# Patient Record
Sex: Female | Born: 1960 | Race: Black or African American | Hispanic: No | Marital: Married | Smoking: Current every day smoker
Health system: Southern US, Community
[De-identification: ages and names within clinical notes are randomized; demographics above are authoritative.]

## PROBLEM LIST (undated history)

## (undated) DIAGNOSIS — G8929 Other chronic pain: Secondary | ICD-10-CM

## (undated) DIAGNOSIS — J449 Chronic obstructive pulmonary disease, unspecified: Secondary | ICD-10-CM

## (undated) DIAGNOSIS — N63 Unspecified lump in unspecified breast: Secondary | ICD-10-CM

## (undated) DIAGNOSIS — I10 Essential (primary) hypertension: Secondary | ICD-10-CM

## (undated) DIAGNOSIS — Z970 Presence of artificial eye: Secondary | ICD-10-CM

## (undated) DIAGNOSIS — T4145XA Adverse effect of unspecified anesthetic, initial encounter: Secondary | ICD-10-CM

## (undated) DIAGNOSIS — K219 Gastro-esophageal reflux disease without esophagitis: Secondary | ICD-10-CM

## (undated) DIAGNOSIS — R0789 Other chest pain: Secondary | ICD-10-CM

## (undated) DIAGNOSIS — J45909 Unspecified asthma, uncomplicated: Secondary | ICD-10-CM

## (undated) DIAGNOSIS — R51 Headache: Secondary | ICD-10-CM

## (undated) DIAGNOSIS — F17210 Nicotine dependence, cigarettes, uncomplicated: Secondary | ICD-10-CM

## (undated) DIAGNOSIS — R519 Headache, unspecified: Secondary | ICD-10-CM

## (undated) DIAGNOSIS — Z9289 Personal history of other medical treatment: Secondary | ICD-10-CM

## (undated) DIAGNOSIS — K5790 Diverticulosis of intestine, part unspecified, without perforation or abscess without bleeding: Secondary | ICD-10-CM

## (undated) DIAGNOSIS — M1990 Unspecified osteoarthritis, unspecified site: Secondary | ICD-10-CM

## (undated) DIAGNOSIS — E669 Obesity, unspecified: Secondary | ICD-10-CM

## (undated) DIAGNOSIS — I5030 Unspecified diastolic (congestive) heart failure: Secondary | ICD-10-CM

## (undated) DIAGNOSIS — E119 Type 2 diabetes mellitus without complications: Secondary | ICD-10-CM

## (undated) DIAGNOSIS — K259 Gastric ulcer, unspecified as acute or chronic, without hemorrhage or perforation: Secondary | ICD-10-CM

## (undated) DIAGNOSIS — K589 Irritable bowel syndrome without diarrhea: Secondary | ICD-10-CM

## (undated) DIAGNOSIS — D1803 Hemangioma of intra-abdominal structures: Secondary | ICD-10-CM

## (undated) DIAGNOSIS — G4730 Sleep apnea, unspecified: Secondary | ICD-10-CM

## (undated) HISTORY — DX: Gastro-esophageal reflux disease without esophagitis: K21.9

## (undated) HISTORY — DX: Other chronic pain: G89.29

## (undated) HISTORY — DX: Chronic obstructive pulmonary disease, unspecified: J44.9

## (undated) HISTORY — PX: LAPAROSCOPY: SHX197

## (undated) HISTORY — DX: Personal history of other medical treatment: Z92.89

## (undated) HISTORY — DX: Headache, unspecified: R51.9

## (undated) HISTORY — DX: Headache: R51

## (undated) HISTORY — DX: Unspecified osteoarthritis, unspecified site: M19.90

## (undated) HISTORY — DX: Essential (primary) hypertension: I10

## (undated) HISTORY — DX: Sleep apnea, unspecified: G47.30

## (undated) HISTORY — DX: Hemangioma of intra-abdominal structures: D18.03

## (undated) HISTORY — PX: RECONSTRUCTION OF EYELID: SHX6576

## (undated) HISTORY — DX: Nicotine dependence, cigarettes, uncomplicated: F17.210

## (undated) HISTORY — DX: Type 2 diabetes mellitus without complications: E11.9

## (undated) HISTORY — PX: TUBAL LIGATION: SHX77

## (undated) HISTORY — DX: Obesity, unspecified: E66.9

## (undated) HISTORY — DX: Other chest pain: R07.89

## (undated) HISTORY — DX: Irritable bowel syndrome, unspecified: K58.9

## (undated) HISTORY — DX: Diverticulosis of intestine, part unspecified, without perforation or abscess without bleeding: K57.90

## (undated) HISTORY — DX: Gastric ulcer, unspecified as acute or chronic, without hemorrhage or perforation: K25.9

## (undated) HISTORY — DX: Unspecified asthma, uncomplicated: J45.909

---
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
## 1991-12-01 HISTORY — PX: CHOLECYSTECTOMY: SHX55

## 1998-11-18 ENCOUNTER — Encounter: Payer: Self-pay | Admitting: Gastroenterology

## 1998-11-18 ENCOUNTER — Other Ambulatory Visit: Admission: RE | Admit: 1998-11-18 | Discharge: 1998-11-18 | Payer: Self-pay | Admitting: Gastroenterology

## 1999-06-14 ENCOUNTER — Inpatient Hospital Stay (HOSPITAL_COMMUNITY): Admission: AD | Admit: 1999-06-14 | Discharge: 1999-06-14 | Payer: Self-pay | Admitting: Gynecology

## 1999-08-14 ENCOUNTER — Encounter: Payer: Self-pay | Admitting: Obstetrics and Gynecology

## 1999-08-14 ENCOUNTER — Ambulatory Visit (HOSPITAL_COMMUNITY): Admission: RE | Admit: 1999-08-14 | Discharge: 1999-08-14 | Payer: Self-pay | Admitting: Obstetrics and Gynecology

## 1999-09-15 ENCOUNTER — Inpatient Hospital Stay (HOSPITAL_COMMUNITY): Admission: AD | Admit: 1999-09-15 | Discharge: 1999-09-15 | Payer: Self-pay | Admitting: Obstetrics and Gynecology

## 1999-09-20 ENCOUNTER — Encounter: Payer: Self-pay | Admitting: Obstetrics and Gynecology

## 1999-09-20 ENCOUNTER — Ambulatory Visit (HOSPITAL_COMMUNITY): Admission: RE | Admit: 1999-09-20 | Discharge: 1999-09-20 | Payer: Self-pay | Admitting: Obstetrics and Gynecology

## 1999-09-28 ENCOUNTER — Encounter: Payer: Self-pay | Admitting: Obstetrics and Gynecology

## 1999-09-28 ENCOUNTER — Ambulatory Visit (HOSPITAL_COMMUNITY): Admission: RE | Admit: 1999-09-28 | Discharge: 1999-09-28 | Payer: Self-pay | Admitting: Obstetrics and Gynecology

## 1999-11-20 ENCOUNTER — Encounter: Payer: Self-pay | Admitting: *Deleted

## 1999-11-20 ENCOUNTER — Encounter: Admission: RE | Admit: 1999-11-20 | Discharge: 1999-11-20 | Payer: Self-pay | Admitting: *Deleted

## 2000-07-08 ENCOUNTER — Encounter: Payer: Self-pay | Admitting: Family Medicine

## 2000-07-08 ENCOUNTER — Ambulatory Visit (HOSPITAL_COMMUNITY): Admission: RE | Admit: 2000-07-08 | Discharge: 2000-07-08 | Payer: Self-pay | Admitting: Family Medicine

## 2000-08-19 ENCOUNTER — Ambulatory Visit (HOSPITAL_BASED_OUTPATIENT_CLINIC_OR_DEPARTMENT_OTHER): Admission: RE | Admit: 2000-08-19 | Discharge: 2000-08-19 | Payer: Self-pay | Admitting: Otolaryngology

## 2000-09-05 ENCOUNTER — Inpatient Hospital Stay (HOSPITAL_COMMUNITY): Admission: AD | Admit: 2000-09-05 | Discharge: 2000-09-05 | Payer: Self-pay | Admitting: Gynecology

## 2000-09-05 ENCOUNTER — Encounter: Payer: Self-pay | Admitting: Gynecology

## 2000-09-07 ENCOUNTER — Other Ambulatory Visit: Admission: RE | Admit: 2000-09-07 | Discharge: 2000-09-07 | Payer: Self-pay | Admitting: Gynecology

## 2000-09-07 ENCOUNTER — Encounter (INDEPENDENT_AMBULATORY_CARE_PROVIDER_SITE_OTHER): Payer: Self-pay | Admitting: Specialist

## 2000-09-10 ENCOUNTER — Inpatient Hospital Stay (HOSPITAL_COMMUNITY): Admission: AD | Admit: 2000-09-10 | Discharge: 2000-09-10 | Payer: Self-pay | Admitting: *Deleted

## 2000-09-18 ENCOUNTER — Encounter (INDEPENDENT_AMBULATORY_CARE_PROVIDER_SITE_OTHER): Payer: Self-pay

## 2000-09-18 ENCOUNTER — Encounter: Payer: Self-pay | Admitting: Gynecology

## 2000-09-19 ENCOUNTER — Inpatient Hospital Stay (HOSPITAL_COMMUNITY): Admission: AD | Admit: 2000-09-19 | Discharge: 2000-09-23 | Payer: Self-pay | Admitting: Gynecology

## 2000-09-20 ENCOUNTER — Encounter: Payer: Self-pay | Admitting: Gynecology

## 2000-09-21 ENCOUNTER — Encounter: Payer: Self-pay | Admitting: Gynecology

## 2002-01-27 ENCOUNTER — Other Ambulatory Visit: Admission: RE | Admit: 2002-01-27 | Discharge: 2002-01-27 | Payer: Self-pay | Admitting: Gynecology

## 2002-03-24 ENCOUNTER — Encounter: Payer: Self-pay | Admitting: *Deleted

## 2002-03-24 ENCOUNTER — Ambulatory Visit (HOSPITAL_COMMUNITY): Admission: RE | Admit: 2002-03-24 | Discharge: 2002-03-24 | Payer: Self-pay | Admitting: *Deleted

## 2002-05-19 ENCOUNTER — Encounter: Admission: RE | Admit: 2002-05-19 | Discharge: 2002-05-19 | Payer: Self-pay | Admitting: Gynecology

## 2002-07-27 ENCOUNTER — Encounter: Admission: RE | Admit: 2002-07-27 | Discharge: 2002-07-27 | Payer: Self-pay | Admitting: General Surgery

## 2002-07-27 ENCOUNTER — Encounter: Payer: Self-pay | Admitting: General Surgery

## 2002-08-04 ENCOUNTER — Inpatient Hospital Stay (HOSPITAL_COMMUNITY): Admission: AD | Admit: 2002-08-04 | Discharge: 2002-08-07 | Payer: Self-pay | Admitting: Gynecology

## 2002-08-04 ENCOUNTER — Encounter (INDEPENDENT_AMBULATORY_CARE_PROVIDER_SITE_OTHER): Payer: Self-pay

## 2002-08-04 DIAGNOSIS — O24419 Gestational diabetes mellitus in pregnancy, unspecified control: Secondary | ICD-10-CM

## 2002-09-11 ENCOUNTER — Other Ambulatory Visit: Admission: RE | Admit: 2002-09-11 | Discharge: 2002-09-11 | Payer: Self-pay | Admitting: Gynecology

## 2002-11-30 HISTORY — PX: UMBILICAL HERNIA REPAIR: SHX196

## 2002-11-30 HISTORY — PX: HERNIA REPAIR: SHX51

## 2003-01-05 ENCOUNTER — Encounter: Payer: Self-pay | Admitting: General Surgery

## 2003-01-08 ENCOUNTER — Inpatient Hospital Stay (HOSPITAL_COMMUNITY): Admission: EM | Admit: 2003-01-08 | Discharge: 2003-01-10 | Payer: Self-pay | Admitting: General Surgery

## 2003-07-12 ENCOUNTER — Encounter: Payer: Self-pay | Admitting: *Deleted

## 2003-07-12 ENCOUNTER — Ambulatory Visit (HOSPITAL_COMMUNITY): Admission: RE | Admit: 2003-07-12 | Discharge: 2003-07-12 | Payer: Self-pay | Admitting: *Deleted

## 2003-09-24 ENCOUNTER — Other Ambulatory Visit: Admission: RE | Admit: 2003-09-24 | Discharge: 2003-09-24 | Payer: Self-pay | Admitting: Gynecology

## 2004-12-18 ENCOUNTER — Other Ambulatory Visit: Admission: RE | Admit: 2004-12-18 | Discharge: 2004-12-18 | Payer: Self-pay | Admitting: Gynecology

## 2005-10-26 ENCOUNTER — Emergency Department (HOSPITAL_COMMUNITY): Admission: EM | Admit: 2005-10-26 | Discharge: 2005-10-26 | Payer: Self-pay | Admitting: Emergency Medicine

## 2005-12-25 ENCOUNTER — Emergency Department (HOSPITAL_COMMUNITY): Admission: EM | Admit: 2005-12-25 | Discharge: 2005-12-25 | Payer: Self-pay | Admitting: Emergency Medicine

## 2006-06-17 ENCOUNTER — Emergency Department (HOSPITAL_COMMUNITY): Admission: EM | Admit: 2006-06-17 | Discharge: 2006-06-17 | Payer: Self-pay | Admitting: Emergency Medicine

## 2007-03-25 HISTORY — PX: CARDIOVASCULAR STRESS TEST: SHX262

## 2007-08-10 ENCOUNTER — Emergency Department (HOSPITAL_COMMUNITY): Admission: RE | Admit: 2007-08-10 | Discharge: 2007-08-11 | Payer: Self-pay | Admitting: Family Medicine

## 2007-12-27 ENCOUNTER — Observation Stay (HOSPITAL_COMMUNITY): Admission: EM | Admit: 2007-12-27 | Discharge: 2007-12-28 | Payer: Self-pay | Admitting: Emergency Medicine

## 2007-12-28 HISTORY — PX: CARDIAC CATHETERIZATION: SHX172

## 2008-05-03 ENCOUNTER — Ambulatory Visit (HOSPITAL_COMMUNITY): Admission: RE | Admit: 2008-05-03 | Discharge: 2008-05-03 | Payer: Self-pay | Admitting: Obstetrics and Gynecology

## 2008-05-03 ENCOUNTER — Ambulatory Visit: Payer: Self-pay | Admitting: Vascular Surgery

## 2008-05-03 ENCOUNTER — Encounter (INDEPENDENT_AMBULATORY_CARE_PROVIDER_SITE_OTHER): Payer: Self-pay | Admitting: Obstetrics and Gynecology

## 2008-05-07 ENCOUNTER — Inpatient Hospital Stay (HOSPITAL_COMMUNITY): Admission: AD | Admit: 2008-05-07 | Discharge: 2008-05-07 | Payer: Self-pay | Admitting: Obstetrics and Gynecology

## 2008-05-08 HISTORY — PX: US ECHOCARDIOGRAPHY: HXRAD669

## 2008-05-25 ENCOUNTER — Encounter (HOSPITAL_COMMUNITY): Payer: Self-pay | Admitting: Obstetrics and Gynecology

## 2008-05-25 ENCOUNTER — Ambulatory Visit: Payer: Self-pay | Admitting: Surgery

## 2008-05-25 ENCOUNTER — Inpatient Hospital Stay (HOSPITAL_COMMUNITY): Admission: AD | Admit: 2008-05-25 | Discharge: 2008-05-25 | Payer: Self-pay | Admitting: Obstetrics and Gynecology

## 2008-06-21 ENCOUNTER — Inpatient Hospital Stay (HOSPITAL_COMMUNITY): Admission: AD | Admit: 2008-06-21 | Discharge: 2008-06-22 | Payer: Self-pay | Admitting: Obstetrics & Gynecology

## 2008-07-13 ENCOUNTER — Inpatient Hospital Stay (HOSPITAL_COMMUNITY): Admission: AD | Admit: 2008-07-13 | Discharge: 2008-07-13 | Payer: Self-pay | Admitting: Obstetrics and Gynecology

## 2008-08-03 ENCOUNTER — Inpatient Hospital Stay (HOSPITAL_COMMUNITY): Admission: AD | Admit: 2008-08-03 | Discharge: 2008-08-03 | Payer: Self-pay | Admitting: Obstetrics and Gynecology

## 2008-08-10 ENCOUNTER — Inpatient Hospital Stay (HOSPITAL_COMMUNITY): Admission: AD | Admit: 2008-08-10 | Discharge: 2008-08-10 | Payer: Self-pay | Admitting: Obstetrics and Gynecology

## 2008-08-15 ENCOUNTER — Inpatient Hospital Stay (HOSPITAL_COMMUNITY): Admission: AD | Admit: 2008-08-15 | Discharge: 2008-08-15 | Payer: Self-pay | Admitting: Obstetrics and Gynecology

## 2008-08-20 ENCOUNTER — Inpatient Hospital Stay (HOSPITAL_COMMUNITY): Admission: AD | Admit: 2008-08-20 | Discharge: 2008-08-20 | Payer: Self-pay | Admitting: Obstetrics and Gynecology

## 2008-09-06 ENCOUNTER — Inpatient Hospital Stay (HOSPITAL_COMMUNITY): Admission: AD | Admit: 2008-09-06 | Discharge: 2008-09-06 | Payer: Self-pay | Admitting: Obstetrics and Gynecology

## 2008-09-14 ENCOUNTER — Encounter (INDEPENDENT_AMBULATORY_CARE_PROVIDER_SITE_OTHER): Payer: Self-pay | Admitting: Obstetrics and Gynecology

## 2008-09-14 ENCOUNTER — Inpatient Hospital Stay (HOSPITAL_COMMUNITY)
Admission: RE | Admit: 2008-09-14 | Discharge: 2008-09-17 | Payer: Self-pay | Source: Ambulatory Visit | Admitting: Obstetrics and Gynecology

## 2008-09-24 ENCOUNTER — Inpatient Hospital Stay (HOSPITAL_COMMUNITY): Admission: AD | Admit: 2008-09-24 | Discharge: 2008-09-27 | Payer: Self-pay | Admitting: Obstetrics and Gynecology

## 2008-09-25 ENCOUNTER — Other Ambulatory Visit: Payer: Self-pay | Admitting: Internal Medicine

## 2008-09-25 ENCOUNTER — Encounter (INDEPENDENT_AMBULATORY_CARE_PROVIDER_SITE_OTHER): Payer: Self-pay | Admitting: Obstetrics and Gynecology

## 2008-09-26 ENCOUNTER — Other Ambulatory Visit: Payer: Self-pay | Admitting: Cardiovascular Disease

## 2008-09-26 ENCOUNTER — Encounter: Payer: Self-pay | Admitting: Cardiovascular Disease

## 2008-09-26 ENCOUNTER — Ambulatory Visit: Payer: Self-pay | Admitting: *Deleted

## 2008-10-31 ENCOUNTER — Encounter: Admission: RE | Admit: 2008-10-31 | Discharge: 2008-10-31 | Payer: Self-pay | Admitting: Obstetrics and Gynecology

## 2009-12-19 ENCOUNTER — Encounter: Admission: RE | Admit: 2009-12-19 | Discharge: 2010-03-19 | Payer: Self-pay | Admitting: Obstetrics and Gynecology

## 2010-09-30 HISTORY — PX: ENDOMETRIAL ABLATION: SHX621

## 2010-10-14 ENCOUNTER — Ambulatory Visit (HOSPITAL_COMMUNITY)
Admission: RE | Admit: 2010-10-14 | Discharge: 2010-10-14 | Payer: Self-pay | Source: Home / Self Care | Admitting: Obstetrics and Gynecology

## 2010-12-21 ENCOUNTER — Encounter: Payer: Self-pay | Admitting: Family Medicine

## 2011-02-10 LAB — BASIC METABOLIC PANEL
Creatinine, Ser: 0.79 mg/dL (ref 0.4–1.2)
GFR calc Af Amer: >60 mL/min (ref >60)
Potassium: 4 mEq/L (ref 3.5–5.1)
Sodium: 139 mEq/L (ref 135–145)

## 2011-02-10 LAB — CBC
HCT: 44.3 % (ref 36.0–46.0)
Hemoglobin: 14.8 g/dL (ref 12.0–15.0)
Platelets: 244 10*3/uL (ref 150–400)

## 2011-04-14 NOTE — Consult Note (Signed)
NAMEDORNA, Lisa [name]               ACCOUNT NO.:  [id number]   MEDICAL RECORD NO.:  [id number]          PATIENT TYPE:  WOC   LOCATION:  WOC                          FACILITY:  [facility]   PHYSICIAN:  Antonietta Breach, M.D.  DATE OF BIRTH:  [birth year]   DATE OF CONSULTATION:  09/26/2008  DATE OF DISCHARGE:                                 CONSULTATION   REQUESTING PHYSICIAN:  Vesta Mixer, M.D.   REASON FOR CONSULTATION:  Anxiety, rule-out postpartum depression.   HISTORY OF PRESENT ILLNESS:  Lisa [name] is a 50 year old female  admitted to the [facility] with chest pain   Her chest pain evaluation has been negative for cardiac source.   However, the patient does continue with excessive worry.  She is worried  about various possibilities that are unlikely such as her new baby  having SIDS or her losing another relative who was close to her.  She  weeps when these worries increase or when she is given the opportunity  to talk about them.  She does have intact interests and intact hope.  Her appetite is normal.  She is not having any thoughts of harming  herself or others.  She has no delusions or hallucinations.   Her orientation and memory function are intact.  She is cooperative and  socially appropriate with her healthcare providers however, she has been  insisting that her self assessment is that she is clear for discharge  and therefore she wants to go home and be with her new baby and has been  saying that she will leave against medical advice   She stopped making comments about leaving against medical advice and is  now willing to see the undersigned regarding mental health care.  The  patients excessive worry has not worsened however, it has not improved  with reassurance by friends.   The onset of the increased worry is associated with the birth of the  baby.  Please see the discussion below.   PAST PSYCHIATRIC HISTORY:  Six years ago the patient delivered a  baby  and her grandmother died at the same time.  She developed depressed mood  and suicidal thoughts and was prescribed Zoloft by her primary care  physician.  As the Zoloft was increased she developed severe  restlessness and akathisia and stopped it.  She did recover from the  depressive symptoms.  It appears now that the birth of her new baby is a  cue for recurrent grief and worry that someone else close to her in her  family might die soon.   The patient does describe a number deaths in the family that occurred  when the patient was in her adolescence.  She recognizes that she never  had a chance to adequately express her grief.   The patient does not have any history of increased energy or decreased  need for sleep or other manic symptoms.  She has no history of suicide  attempts.  She has no history of hallucinations.  She has never had any  other mental health care than that mentioned above.  In review of the past medical record there is no mentioning of  counseling or psychotropic medication.   FAMILY PSYCHIATRIC HISTORY:  None known.   SOCIAL HISTORY:  The patient has a 36-year-old child at home, along with  her newborn baby which was just delivered at [facility] recently.  She has a supportive husband.  She does not use alcohol or illegal  drugs.   PAST MEDICAL HISTORY:  1. Recent C-section.  2. History of prior C-section.  3. History of hypertension.   No known drug allergies.   MEDICATIONS:  MAR is reviewed, not on any psychotropic medications.   LABORATORY DATA:  WBC 7.6, hemoglobin 13.2.  Sodium 140, potassium 3.6,  BUN 15, creatinine 0.78, glucose 83. RPR nonreactive.   REVIEW OF SYSTEMS:  CONSTITUTIONAL, HEAD, EYES, EARS, NOSE, THROAT,  MOUTH, NEUROLOGIC, PSYCHIATRIC, CARDIOVASCULAR, RESPIRATORY,  GASTROINTESTINAL, GENITOURINARY, SKIN, MUSCULOSKELETAL, HEMATOLOGIC,  LYMPHATIC, ENDOCRINE, METABOLIC:  All unremarkable.   PHYSICAL EXAMINATION:  VITAL  SIGNS:  Temperature 98.4, pulse 61,  respiratory rate 16, blood pressure 149/72, O2 saturation on 2 L 98%.  GENERAL APPEARANCE:  Lisa [name] is a middle-aged female sitting up in  her hospital bed with no abnormal involuntary movements.   MENTAL STATUS EXAM:  Lisa [name] is alert.  Her eye contact is good.  Her attention span is within normal limits.  Her concentration is within  normal limits.  Her affect is broad and appropriate except for  occasional tears where she is discussing the subjects and worries  mentioned above.  Her mood is slightly anxious.  She is oriented to all  spheres.  Her memory is intact to immediate, recent and remote.  Her  fund of knowledge and intelligence are within normal limits.  Speech  involves normal rate and prosody without dysarthria.  Thought process is  logical, coherent, goal-directed.  No looseness of associations.  Thought content, no thoughts of harming herself, no thoughts of harming  others, no delusions, no hallucinations.  Insight is intact, judgment is  intact.   ASSESSMENT:  AXIS I:  293.84.  1.  Anxiety disorder not otherwise  specified.  Rule-out adjustment with anxious mood versus generalized  anxiety disorder.  2.  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  AXIS II:  Deferred.  AXIS III:  See past medical history.  AXIS IV:  1.  General medical.  2.  Grief.  AXIS V:  55.   Lisa [name] is not at risk to harm herself or others or the baby.  She  does agree to call emergency services 911 immediately for any thoughts  of harming herself, thoughts of harming others or thoughts of harming  the baby, worsening symptoms or distress.   The undersigned provided ego supportive psychotherapy and education   The undersigned discussed medication.  The patient declines medication  and she has no critical indications for the medication.   The undersigned discussed psychotherapy for grief, as well as cognitive  behavioral  therapy, deep breathing and progressive muscle relaxation for  anxiety.   The intensive outpatient program was also discussed which would allow  her to attend intensive coping skills and stress management therapy  starting immediately however, the patient declined this form of  treatment and she is not committable.   Lisa [name] prefers  to have a standard psychotherapy appointment where  she can pursue the cognitive behavioral therapy, deep breathing and  progressive muscle relaxation along with grief counseling.   She agrees to an  outpatient psychiatric appointment also for further  examination to confirm that she is proceeding without postpartum  depression.   The undersigned will place an order for the social worker to coordinate  with the patient and her insurance company [employer] panel to set up both  of the above follow-ups.      Antonietta Breach, M.D.  Electronically Signed     JW/MEDQ  D:  09/26/2008  T:  09/26/2008  Job:  [id number]   cc:   Vesta Mixer, M.D.

## 2011-04-14 NOTE — Consult Note (Signed)
[name], CONWAY               ACCOUNT NO.:  [id number]   MEDICAL RECORD NO.:  [id number]          PATIENT TYPE:  INP   LOCATION:  2915                         FACILITY:  [facility]   PHYSICIAN:  Antonietta Breach, M.D.  DATE OF BIRTH:  [birth year]   DATE OF CONSULTATION:  09/26/2008  DATE OF DISCHARGE:                                 CONSULTATION   Would confirm that a recent TSH has been conducted, and if not, would  check the patient's TSH level.      Antonietta Breach, M.D.  Electronically Signed     JW/MEDQ  D:  09/26/2008  T:  09/26/2008  Job:  [id number]

## 2011-04-14 NOTE — Op Note (Signed)
NAMETERRICKA, Lisa               ACCOUNT NO.:  [id number]   MEDICAL RECORD NO.:  [id number]          PATIENT TYPE:  INP   LOCATION:  9137                          [facility]   PHYSICIAN:  Michelle L. Grewal, M.D.DATE OF BIRTH:  Dec 10, 1960   DATE OF PROCEDURE:  09/14/2008  DATE OF DISCHARGE:                               OPERATIVE REPORT   PREOPERATIVE DIAGNOSES:  Intrauterine pregnancy at 36 weeks and 5 days,  previous cesarean section, desires permanent sterilization, mature  amniocentesis, chronic hypertension, and progressive shortness of breath  and edema.   POSTOPERATIVE DIAGNOSES:  Intrauterine pregnancy at 36 weeks and 5 days,  previous cesarean section, desires permanent sterilization, mature  amniocentesis, chronic hypertension, and progressive shortness of breath  and edema.   PROCEDURE:  Repeat low-transverse cesarean section and bilateral tubal  ligation.   HISTORY OF PRESENT ILLNESS:  The patient is a 50 year old gravida 3,  para 1 who presents today at 36 weeks and 5 days.  Her medical history  is significant for chronic hypertension followed by Dr. Elease Hashimoto and Dr.  Patty Sermons.  She has been on atenolol throughout her pregnancy.  She has  been unable to tolerate labetalol or any other medication such as  metoprolol or Aldomet.  She has also a history of seasonal asthma.  The  patient has progressively developed progressive shortness of breath,  dyspnea, and edema throughout her pregnancy which has worsened over the  past few weeks.  She also has a history of sleep apnea for which she  sees Dr. Elease Hashimoto for.  Her blood pressures have been under fair control  with her antihypertensive.  She had a amniocentesis because of her  progression of symptoms which showed an L/S of 3.7 with PEG.  The  patient is scheduled today for repeat low transverse cesarean section  and she also desires permanent sterilization.   DESCRIPTION OF PROCEDURE:  The patient is taken to the  operating room  and she is given spinal without incidence.  She was prepped and draped  in usual sterile fashion.  A Foley catheter was inserted.  A low  transverse incision was made and carried down to the fascia.  Fascia was  scored in the midline and extended laterally.  Of note, she had a  previous vertical incision.  There was a  permanent suture noted and as  well as mesh noted in the upper part of her abdomen, but was not near  were we made the Pfannenstiel incision.  The peritoneum was entered  bluntly.  The peritoneal incision was then stretched.  The bladder flap  was created sharply and then digitally and the bladder blade was  readjusted.  A low transverse incision was made in the uterus and the  uterus was entered using a hemostat.  The baby was in cephalic  presentation and was delivered easily with a vacuum extractor.  The baby  was a female infant with Apgars of 8 at one minute and 9 at five minutes  and the cord was clamped and cut and he was taken to the newborn  nursery.  The placenta was manually removed, noted to be normal and  intact with three-vessel cord.  The uterus was cleared of all clots and  debris.  I was unable to exteriorize the uterus because the uterus was  so enlarged because of her multiple fibroids.  The patient desired  permanent sterilization.  I did close the uterine incision with one  layer using 0 chromic in a running locked stitch.  Hemostasis was  excellent.  I then identified the left fallopian tube, followed it out  to the fimbriated end, grasped the midportion of the tube and grasped  the midportion of the tube and tied off at 3 cm a knuckle x2 with plain  gut suture x2 and then excised that knuckle using Metzenbaum scissors.  This was sent to Pathology as left fallopian tube segment.  I then  turned my attention to the right fallopian tube, followed it out to the  fimbriated end.  I could tell that she has had a previous ectopic  pregnancy  because it seemed like the midportion of that tube was  atrophied and it almost looked like she had a salpingectomy even though  the patient told me that she had not.  I then grasped the midportion of  that tube, tied off a 3 cm knuckle and tied it off using plain gut  suture x2, that knuckle tube was then excised and sent to Pathology and  labeled as right fallopian tube segment.  Hemostasis was excellent.  Irrigation was performed.  Hemostasis was again noted.  The peritoneum  rectus muscles were reapproximated using 0 Vicryl.  The fascia was  closed using 0 Vicryl x2 starting each corner meeting in the midline in  the running stitch.  After irrigation of the subcutaneous layer, the  skin was closed with staples.  All sponge, lap, and instrument counts  were correct x2.  The patient went to recovery room in stable condition.      Michelle L. Vincente Poli, M.D.  Electronically Signed     MLG/MEDQ  D:  09/14/2008  T:  09/15/2008  Job:  [id number]

## 2011-04-14 NOTE — Cardiovascular Report (Signed)
[name], [name]               ACCOUNT NO.:  [id number]   MEDICAL RECORD NO.:  [id number]          PATIENT TYPE:  INP   LOCATION:  6531                         FACILITY:  [facility]   PHYSICIAN:  Vesta Mixer, M.D. DATE OF BIRTH:  [birth year]   DATE OF PROCEDURE:  12/28/2007  DATE OF DISCHARGE:                            CARDIAC CATHETERIZATION   Lisa [name] is a 50 year old female with a history of chest pain for  the past year.  She was admitted yesterday with recurrent episode of  chest pain that felt like a bowling ball in her chest.  She is referred  for heart catheterization for further evaluation.   The right femoral artery was easily cannulated using a modified  Seldinger technique.   HEMODYNAMICS:  LV pressure was 139/12 with an aortic pressure of 138/79.   Angiography, left main:  The left main is fairly normal.   The left main divides into four different branches.  We will call these  the LAD, ramus intermediate #1, ramus intermediate #2, and a left  circumflex artery.   The left anterior descending artery has a few minor luminal  irregularities.  The first diagonal artery is fairly distal branch and  has only minor luminal irregularities.  The LAD reaches around the apex  and supplies the inferoapical wall.   The first ramus intermediate branch is a moderate-sized branch.  There  are minor luminal irregularities.   The second ramus intermediate branch follows a more traditional path of  a ramus intermediate and is relatively free of disease.   The circumflex artery is a moderate-sized vessel.  It gives off a small  obtuse marginal artery which is normal.  There are minor luminal  irregularities in the circumflex vessel.   The right coronary artery is moderate size.  It gives off a few small RV  marginal branches and then a small posterior descending artery.  There  are minor luminal irregularities in the right coronary artery.   Left ventriculogram was  performed in a 30 RAO position.  It reveals  normal/supranormal left ventricular systolic function.  The ejection  fraction is around 75%.   COMPLICATIONS:  None.   CONCLUSION:  1. Minor coronary artery irregularities.  2. Normal left ventricular systolic function.   The patient will be stable for discharge later this evening.           ______________________________  Vesta Mixer, M.D.     PJN/MEDQ  D:  12/28/2007  T:  12/28/2007  Job:  [id number]   cc:   Maryelizabeth Rowan, M.D.

## 2011-04-14 NOTE — Discharge Summary (Signed)
Lisa [name], Lisa [name]               ACCOUNT NO.:  [id number]   MEDICAL RECORD NO.:  [id number]          PATIENT TYPE:  INP   LOCATION:  2915                         FACILITY:  [facility]   PHYSICIAN:  Vesta Mixer, M.D. DATE OF BIRTH:  12-Jul-1961   DATE OF ADMISSION:  09/25/2008  DATE OF DISCHARGE:  09/27/2008                               DISCHARGE SUMMARY   DISCHARGE DIAGNOSES:  1. Noncardiac chest pain.  2. Recent cesarean section delivery of a healthy baby.  3. History of cigarette smoking.  4. Hypertension.   DISCHARGE MEDICATIONS:  1. Amlodipine 5 mg a day.  2. Hydrochlorothiazide 25 mg a day.  3. Ibuprofen 200 mg - 2 tablets 3 times a day as needed.  4. Atenolol 25 mg a day.  5. Potassium chloride 10 mEq a day.   DISPOSITION:  The patient will see Dr. Elease Hashimoto for followup in 1-2 weeks.   PROCEDURES DONE:  1. CT angio of the chest - negative for pulmonary embolus and negative      for other abnormalities.  2. Echocardiogram - normal left ventricular systolic function.  3. Venous Dopplers of the right lower extremity - no evidence of DVT.   HISTORY:  Lisa [name] is a 50 year old female who was admitted  originally by Dr. Zelphia Cairo to [facility] for episodes of  chest pain.  The chest pain and shortness breath occurred 1 week  following the C-section of her baby.  She was transferred to Dr.  Harvie Bridge service and was transferred to [facility]  Please see the  dictated H&P by Dr. Renaldo Fiddler and the consult note by Dr. Elease Hashimoto.   HOSPITAL COURSE:  1. Chest pain.  The patient ruled out for myocardial infarction.  The      day after admission, she had a CT angiography, which revealed no      evidence of pulmonary embolus.  She also had no significant other      pulmonary abnormalities by angiography.  She was noted to have some      shortness of breath and her pO2 at [facility] was in the 40s      despite having 100% O2 saturation.  When she transferred to  [facility], her pO2 was in the 80 range.  It is possible that      the lab may have some calibration issues with their ABG machine.      The patient did fairly well.  She had an echocardiogram, which      revealed normal pulmonary pressures.  She has normal left      ventricular systolic function.  The patient has had a heart      catheterization in January 2009, which revealed smooth and normal      coronary arteries.  We will continue to follow up with chest pain      as an outpatient.  2. Hypertension.  The patient's blood pressure was noted be elevated.      She has been off some of her normal medications during her  pregnancy.  We will restart her on hydrochlorothiazide 25 mg a day,      potassium chloride 10 mEq a day, and amlodipine 5 mg a day.  She      will continue on atenolol 25 mg once a day, which is a slightly      lower dose.  3. Anxiety.  The patient expressed some postpartum depression and      anxiety.  She was seen by Dr. Jeanie Sewer, who agreed that she was      probably suffering from anxiety.  He offered to see her as an      outpatient.  No medications were started.  4. Status post delivery.  She will follow up with the OB/GYN doctors      as needed.      Vesta Mixer, M.D.  Electronically Signed     PJN/MEDQ  D:  09/27/2008  T:  09/27/2008  Job:  [id number]   cc:   Zelphia Cairo, MD

## 2011-04-14 NOTE — Consult Note (Signed)
[name], [name]               ACCOUNT NO.:  [id number]   MEDICAL RECORD NO.:  [id number]          PATIENT TYPE:  INP   LOCATION:  9372                          [facility]   PHYSICIAN:  Vesta Mixer, M.D. DATE OF BIRTH:  1961/10/23   DATE OF CONSULTATION:  09/25/2008  DATE OF DISCHARGE:  09/25/2008                                 CONSULTATION   Lisa [name] is a 50 year old female with a history of hypertension and  chest pain.  She recently had a C-section, which was relatively  uneventful.  She presents now with increasing shortness of breath and  chest pain.   Lisa [name] has a long history of chest pains in the past.  She has been  followed by me for several years.  We performed a heart catheterization  in January 2008, which revealed normal left ventricular systolic  function and normal coronary arteries.  She got pregnant this year, and  a followup echocardiogram half way through her pregnancy revealed normal  left ventricular systolic function as well.  She has also had nuclear  study, which reveals normal left ventricular systolic function.   Approximately 1 week ago, she came in and had C-section.  The C-section  was apparently unremarkable.  She did have some mild chest pain the day  following the C-section and some mild edema, which resolved with Lasix.  She went home, but was readmitted to the hospital yesterday.  She  complains of pleuritic chest pain, primarily on the right side.  The  pain is knife like in quality and increases with a deep breath or with a  cough.  She also states that she coughs every time she takes a deep  breath.  The pain is worse when she lies back and is improved when she  sits forward.   CURRENT MEDICATION:  Atenolol 25 mg twice a day.   ALLERGIES:  She has no known drug allergies.   PAST MEDICAL HISTORY:  1. Chest pain - She had normal coronary arteries by heart      catheterization in January 2009.  2. Dyspnea.  3.  Hypertension.   SOCIAL HISTORY:  The patient smokes 1 pack of cigarettes a day.   FAMILY HISTORY:  Noncontributory.   REVIEW OF SYSTEMS:  Consistent with chest pain.   PHYSICAL EXAMINATION:  GENERAL:  She is middle-aged female in no acute  distress.  She is alert and oriented x3.  Her mood and affect are  normal.  VITAL SIGNS:  Her heart rate is 55 and her blood pressure is 150/93.  HEENT:  Carotids 2+.  NECK:  No JVD.  No thyromegaly.  LUNGS:  Clear.  HEART:  Regular rate.  S1 and S2.  ABDOMEN:  Obese.  EXTREMITIES:  She has 2-3+ pitting edema in the right leg.  There is  only trace edema of the left leg.   Her cardiac enzymes are negative x1.  Her chest x-ray reveals mild  vascular congestion.  CT angio reveals no obvious pulmonary embolus.  Echocardiogram was just performed and is currently pending.   Her arterial blood  gas on room air reveals a pH of 7.42, a pCO2 of 41,  and a pO2 of 45, and the saturation was 100%.  Repeat ABG revealed  identical results.  Her BNP was 183.   The white blood cell count was 7.6, hemoglobin 13.2, hematocrit 40.3,  and platelet count 42.  Her sodium is 140, potassium is 3.6, chloride is  107, CO2 is 25, BUN is 15, and creatinine is 1.7.   IMPRESSION AND PLAN:  1. Chest pain.  The patient presents with chest pain as well as some      shortness of breath.  She has had chest pain for many years and her      coronary workup has been fairly extensive.  I do not think that      this represents coronary artery disease, but I do think that we      should complete cardiac enzymes just to rule that out.   The chest pain is pleuritic in nature.  This coupled with her low pO2  still sounds like a pulmonary embolus, although the CT angio was  negative.  It is also possible that she may have developed a pericardial  effusion, which can cause pleuritic chest pain that improves with  sitting up.  We have done an echocardiogram, the results of which are   pending.   She has unilateral swelling of her right leg.  I would like to get a  duplex scan to evaluate her for the possibility of DVT.  We may also  need to get a CT of the abdomen and pelvis to rule out pelvic DVT.   The patient's arterial blood gas is quite curious.  Her pH is normal and  her pO2 is markedly low, but is 100% saturated.  I have discussed the  case with Dr. Rory Percy, and we are ordering an arterial blood  gas with co-oximetry.   We will continue to follow along with you.      Vesta Mixer, M.D.  Electronically Signed     PJN/MEDQ  D:  09/25/2008  T:  09/26/2008  Job:  [id number]   cc:   Lisa Cairo, MD  Fax: [phone]

## 2011-04-14 NOTE — Discharge Summary (Signed)
[name], Lisa [name]               ACCOUNT NO.:  [id number]   MEDICAL RECORD NO.:  [id number]          PATIENT TYPE:  INP   LOCATION:  6531                         FACILITY:  [facility]   PHYSICIAN:  Vesta Mixer, M.D. DATE OF BIRTH:  1961/11/30   DATE OF ADMISSION:  12/27/2007  DATE OF DISCHARGE:  12/28/2007                               DISCHARGE SUMMARY   DISCHARGE DIAGNOSES:  1. Noncardiac chest pain.  2. Mild hypertension.  3. Obesity.   DISCHARGE MEDICATIONS:  1. Enteric-coated aspirin 81 mg a day.  2. Metoprolol 50 mg tablets - 1/2 tablet twice a day.   DISPOSITION:  The patient will see Dr. Elease Hashimoto in 1-2 weeks.  She is to  see Dr. Duanne Guess in several weeks or as needed sooner.   HISTORY:  [name] is a 50 year old female with several year history  of chest pain.  She was admitted through the emergency room for further  evaluation of chest pressure.  Please see dictated H&P for further  details.   HOSPITAL COURSE BY PROBLEM:  Chest pain.  The patient ruled out for  myocardial infarction.  She has been admitted in the past and has had  mildly elevated troponin levels.  Because of these mild elevations in  troponin levels in the past and her recurrent episodes of chest pain, we  decided to proceed with heart catheterization.  She was found to have  minor coronary artery irregularities but no critical stenosis that would  be suggestive of an acute coronary syndrome.  She has normal left  ventricular systolic function with an ejection fraction around 75%.   She tolerated the procedure quite well and is now discharged in  satisfactory condition.  She will see Dr. Elease Hashimoto in several weeks for  follow-up office visit.  She is to call sooner if she has any problems.  All of her other medical problems are stable.           ______________________________  Vesta Mixer, M.D.     PJN/MEDQ  D:  12/28/2007  T:  12/28/2007  Job:  [id number]   cc:   Maryelizabeth Rowan, M.D.

## 2011-04-14 NOTE — H&P (Signed)
NAMEMALICIA, BLASDEL               ACCOUNT NO.:  [id number]   MEDICAL RECORD NO.:  [id number]          PATIENT TYPE:  INP   LOCATION:  6531                         FACILITY:  [facility]   PHYSICIAN:  Vesta Mixer, M.D. DATE OF BIRTH:  Apr 14, 1961   DATE OF ADMISSION:  12/27/2007  DATE OF DISCHARGE:                              HISTORY & PHYSICAL   HISTORY OF PRESENT ILLNESS:  Lisa [name] is a 50 year old female.  She  has a history of chest pains in the past.  She is admitted with episodes  of chest pain.   Lisa [name] reports having chest pains for the past seven or eight years.  We  saw her back in 2001.  At that time she had some mildly elevated, but  persistently elevated troponin levels.  She was not seen again until  2005 when she presented for some tachy palpitations.  She was then seen  again in 2008 for a follow-up visit.  She presented to the ER this  morning with episodes of chest pain for the past three hours.  She saw  her primary medical doctor and was referred to the emergency room for  further evaluation.   The discomfort is described as a pressure-like sensation like a bowling  ball in her chest.  She received nitroglycerin and morphine with relief  of the chest discomfort.  She also describes some shortness of breath.  She notes that her metoprolol dose was slightly altered.  The metoprolol  slow release is on back order and she was changed to regular dose  metoprolol.  Apparently there was some confusion in that dose although  it does not sound like she has missed doses completely.   She denies any syncope or presyncope.   CURRENT MEDICATIONS:  1. Metoprolol 50 mg a day.  2. She also took 4 baby aspirin a day.   ALLERGIES:  None.   PAST MEDICAL HISTORY:  1. Hypertension.  2. Allergies.  3. History of tachy palpitations.  4. History of mildly elevated troponin levels.   SOCIAL HISTORY:  The patient smokes one pack of cigarettes a day.   FAMILY HISTORY:   Positive for coronary artery disease.   PHYSICAL EXAMINATION:  GENERAL APPEARANCE:  She is a young black female  in no acute distress.  She is alert and oriented x3.  Her mood and  affect are normal.  VITAL SIGNS:  Heart rate 68, blood pressure 140/80.  HEENT:  Exam reveals 2+ carotids.  She has no bruits, no JVD, no  thyromegaly.  LUNGS:  Clear to auscultation.  HEART:  Regular rate.  S1, S2.  ABDOMEN:  Abdominal exam reveals good bowel sounds and is nontender.  EXTREMITIES:  She has no clubbing, cyanosis or edema.  NEUROLOGIC:  Exam is nonfocal.   LABORATORY DATA:  Her EKG reveals normal sinus rhythm.  She has some  voltage for left ventricular hypertrophy.  There are no acute ST or T-  wave changes.   Lisa [name] presents with some episodes of chest pressure which were  relieved with nitroglycerin and morphine.  Will start her  on heparin.  I  anticipate doing a heart catheterization on her tomorrow.  We have  discussed the risks, benefits and options of heart catheterization.  She  understands and agrees to proceed.  All of her other medical problems  remain stable.           ______________________________  Vesta Mixer, M.D.     PJN/MEDQ  D:  12/27/2007  T:  12/28/2007  Job:  [id number]   cc:   Maryelizabeth Rowan, M.D.

## 2011-04-17 NOTE — Op Note (Signed)
NAME:  OMARI, KOSLOSKY                         ACCOUNT NO.:  [id number]   MEDICAL RECORD NO.:  [id number]                   PATIENT TYPE:  INP   LOCATION:  9108                                 FACILITY:  [facility]   PHYSICIAN:  Juan H. Lily Peer, M.D.             DATE OF BIRTH:  Jan 15, 1961   DATE OF PROCEDURE:  08/04/2002  DATE OF DISCHARGE:                                 OPERATIVE REPORT   SURGEON:  Gaetano Hawthorne. Lily Peer, M.D.   INDICATIONS FOR PROCEDURE:  This 50 year old female at 4 weeks' gestation  with spontaneous rupture of membranes, active HSV, previous myomectomy.   PREOPERATIVE DIAGNOSES:  1. Term intrauterine pregnancy at 59 1/2 weeks' gestation.  2. Previous uterine myomectomy.  3. Active herpes simplex virus.  4. Premature rupture of membranes.   POSTOPERATIVE DIAGNOSES:  1. Term intrauterine pregnancy at 62 1/2 weeks' gestation.  2. Previous uterine myomectomy.  3. Active herpes simplex virus.  4. Premature rupture of membranes.   ANESTHESIA:  Spinal.   PROCEDURE PERFORMED:  Primary low uterine segment transverse cesarean  section.   FINDINGS:  Viable female infant, Apgars of 8 and 9 with a weight of 7 pounds 8  ounces, arterial cord pH not obtained.  Placenta submitted to pathology.  The patient with a large intramural leiomyoma making it difficult for  exteriorization of the uterus and for closure of the lower uterine segment  incision site.   DESCRIPTION OF OPERATION:  After the patient was adequately counseled, she  was taken to the operating room where she underwent successful epidural  placement.  The abdomen was prepped and draped in the usual sterile fashion.  A Pfannenstiel skin incision was made above her adjacent previous  Pfannenstiel scar.  The scar was excised and passed off the operative field.  The incision was then carried down to subcutaneous tissue, down to the  rectus fascia whereby a midline nick was made.  The fascia was incised in a  transverse fashion.  The peritoneal cavity was entered.  Caution to the  bladder flap was established.  The lower uterine segment was incised in a  transverse fashion.  Clear amniotic fluid was present.  The newborn was  delivered.  The nasopharyngeal airway was bulb suctioned.  The cord was  doubly clamped and excised.  The newborn gave a muted cry was passed off to  the pediatricians after being shown to the mother.  Of note there was a  nuchal cord x1 as well.   After cord blood was obtained, the placenta was delivered from the  intrauterine cavity and submitted for histological evaluation.  The uterus  was unable to be exteriorized due to the fact that it was globular due to  several intramural myomas.  The ovaries were assessed briefly and it  appeared to be normal in appearance and size.  Pitocin drip was initiated.  The patient then received penicillin G approximately  three hours prior, 2.5  mL, and did not require additional antibiotics at this time.  The uterus, as  mentioned, was unable to be exteriorized so the uterus was closed in a  double locking stitch manner.  The pelvic cavity was copiously irrigated  with normal saline solution.  After ascertaining adequate hemostasis, the  visceral peritoneum was not closed.  The rectus fascia was closed with a  running stitch of 0 Vicryl suture.  The subcutaneous bleeders were Bovie  cauterized and skin was approximated with skin clips, followed by placing  Xeroform gauze and followed by a dressing.  The patient was transferred to  the recovery room with stable vital signs.  Estimated blood loss in  procedure was 600 cc, IV fluids was 1400 cc of lactated Ringers.  Urine  output was 100 cc and clear.                                                  Juan H. Lily Peer, M.D.    JHF/MEDQ  D:  08/04/2002  T:  08/04/2002  Job:  [id number]

## 2011-04-17 NOTE — H&P (Signed)
NAME:  Lisa [name], Lisa [name]                         ACCOUNT NO.:  [id number]   MEDICAL RECORD NO.:  [id number]                   PATIENT TYPE:  INP   LOCATION:  9175                                 [facility]ACILITY[facility]   PHYSICIAN:  Juan H. Lily Peer, M.D.             DATE OF BIRTH:  Oct 16, 1961   DATE OF ADMISSION:  08/04/2002  DATE OF DISCHARGE:                                HISTORY & PHYSICAL   CHIEF COMPLAINT:  1. A 38-1/2 week intrauterine pregnancy.  2. Previous rupture of membranes at 0730 hours this morning.  3. Active herpetic vesicular lesions throughout the lower back/sacrum area.  4. Gestational diabetes, diet controlled.  5. Previous myomectomy.   HISTORY:  The patient is a 50 year old gravida 3, para 0, AB2 with a due  date August 14, 2002 currently 38-1/2 weeks estimated gestational age.  Presented to [facility] with complaint of ruptured membranes at 0730  hours this morning.  She stated that Monday of this week she started  noticing a tingling sensation and vesicular formation in her lower back.  She does have a history of herpes virus.  Her cervix is found to be long,  closed, and posterior.  The patient has had a previous abdominal myomectomy  in the past and she has also had an ectopic pregnancy as well.  Her prenatal  course was significant for the fact that she has had gestational diabetes  which was diet controlled.  She did not pass initial diabetes screen but a  three hour GTT was normal.  She suffered from iron deficiency anemia for  which she was placed on supplemental iron.  She had an upper respiratory  infection midway through her pregnancy and also had suffered from gastritis  for which she had taken Prevacid as well.  Screening ultrasound demonstrates  she still had an anterior fibroid present.   PAST MEDICAL HISTORY:  Therapeutic AB 1984, ectopic pregnancy in 2001.   ALLERGIES:  She denies any allergies.   PAST SURGICAL HISTORY:  She has had a  cholecystectomy.  She has had hernia  repair.  She has had a right salpingostomy.  She has had myomectomy in the  past and has gestational diabetes diet controlled with this pregnancy and  has a fibroid uterus as well and history of HSV.   REVIEW OF SYMPTOMS:  See [place] form.   PHYSICAL EXAMINATION:  GENERAL:  Well-developed, well-nourished female.  HEENT:  Unremarkable.  NECK:  Supple.  Trachea midline.  No carotid bruits.  No thyromegaly.  LUNGS:  Clear to auscultation without rhonchi or wheezes.  HEART:  Regular rate and rhythm.  No murmurs or gallops.  BREASTS:  Not done.  ABDOMEN:  Gravid uterus.  Vertex presentation by [facility] maneuver.  PELVIC:  Cervix long, closed, and posterior.  Inspection of the lower back  area demonstrated multiple scattered vesicular lesions from the lumbar  sacral region close to her  rectum.  EXTREMITIES:  DTRs 1+.  Negative clonus.  Trace edema.   PRENATAL LABORATORIES:  O+ blood type.  Negative antibody screen.  Sickle  cell trait was negative.  VDRL was nonreactive.  Rubella immune.  Hepatitis  B surface antigen, HIV were negative.  Pap smear was normal.  Diabetes  screen was abnormal followed by a normal three hour GTT.  GBS culture  pending at the time of dictation.   ASSESSMENT:  A 50 year old gravida 3, para 0, AB2 currently 38-1/2 estimated  gestational age with premature rupture of membranes approximately 0730 hours  this morning.  She was started on pen-G for GBS prophylaxis  as the status  of her GBS culture recently done in the office was not available.  The  patient with active outbreak of herpes in her lower lumbosacral/perirectal  region.  It was based on this that it would be recommended to proceed with a  primary cesarean section.  The patient was counseled the potential risk of  infection, waiting for a long labor.  Her cervix is long and posterior.  Her  contractions are very irregular.  Fetal heart rate tracing was reactive.   Also, the other issue to discuss with the patient concerning [name] is she had  multiple myomectomies and concern about uterine rupture even though the  uterine cavity was not entered at time of the myomectomy.  All these issues  were discussed with the patient and it was felt that the best interest in  the safety for her baby, although one cannot be 100% sure that since she has  ruptured her membranes five hours ago whether it could be underlying  infection of the fetus (her cervix is long and closed and chances are low  since the head is not even engaged at this point).  She understands that  there is nothing that can be 100% sure.  But, nevertheless, the safer route  would be with a cesarean section.  Her and her husband agree.  All questions  are answered and will follow accordingly.   PLAN:  The patient is scheduled for cesarean section within the next hour.                                               Juan H. Lily Peer, M.D.    JHF/MEDQ  D:  08/04/2002  T:  08/04/2002  Job:  [id number]

## 2011-04-17 NOTE — Op Note (Signed)
[place]  Patient:    Lisa [name], Lisa [name]                      MRN: [id number] Proc. Date: 09/19/00 Adm. Date:  [id number] Attending:  Tonye Royalty CC:         Gaetano Hawthorne. Lily Peer, M.D.  [place] Daphine Deutscher, M.D.   Operative Report  INTRAOPERATIVE CONSULTATION  CHIEF COMPLAINT:  Dense adhesions in pelvis.  I was called by Dr. Lily Peer who had already performed a laparotomy for tubal pregnancy regarding a loop of small bowel that was densely adherent to the anterior abdominal wall just below a previous umbilical hernia repair.  I scrubbed in and found a knuckle of bowel that had been sort of adherent in a U form and two arms of the distal U were densely adherent to the anterior abdominal wall.  I ended up finding a suture that seemed to be stuck to the serosal surface of one of the loops of bowel and I cut that down.  I opened that suture and that significantly freed the remaining portion.  I was able to lyse this easily.  The bowel was then removed and brought up the anterior abdominal wall or into the wound and two little areas that were slightly serosalized were repaired with interrupted seromuscular sutures of 4-0 silk. This approximated everything nicely.  There was no leak produced or hole created but just a deserosalization injury.  We inspected that and it looked fine and this was then allowed to go back into the abdomen.  Dr. Lily Peer then closed the case as per his operative note.  When I palpated this it seemed that she had had a previous umbilical hernia repair with mesh and this bowel was most likely stuck up to that in this one point of maximum adherence.  IMPRESSION:  Difficult anterior abdominal wall adhesions, status post take-down. DD:  09/19/00 TD:  09/20/00 Job: [id number]

## 2011-04-17 NOTE — Op Note (Signed)
[facility]  Patient:    Lisa [name], Lisa [name]                      MRN: [id number] Proc. Date: 09/19/00 Adm. Date:  [id number] Attending:  Tonye Royalty                           Operative Report  PREOPERATIVE DIAGNOSIS:       Rule out right ectopic pregnancy.  POSTOPERATIVE DIAGNOSES:      1. Right isthmic ectopic pregnancy, not                                  ruptured.                               2. Multi-cystic ovaries.                               3. Leiomyomata uteri.                               4. Small bowel adhesions.  OPERATION/PROCEDURE:          1. Exploratory laparotomy.                               2. Right linear salpingostomy.                               3. Myomectomy.                               4. Lysis of small bowel adhesions.  SURGEON:                      Juan H. Lily Peer, M.D.  INDICATIONS FOR PROCEDURE:    The patient is a 50 year old gravida 2 para 1 at 7-1/[redacted] weeks gestation, with leveling off of quantitative beta hCG, and unresponsive to outpatient treatment with methotrexate, presenting with an acute abdomen and right lower quadrant pain.  Ultrasound demonstrated no evidence of intrauterine pregnancy but was suspicious for right adnexal ectopic.  INTRAOPERATIVE CONSULTATION:  General surgery, Matthew B. Daphine Deutscher, M.D.  FINDINGS:                     1. Right nonruptured isthmic ectopic pregnancy.                               2. Anterior uterine fibroid measuring                                  4.5 x 3.5 cm.                               3. Polycystic ovaries.  4. Small bowel segment adhered to anterior                                  abdominal wall at site of previous umbilical                                  herniorrhaphy.                               5. Lush fimbria bilaterally.                               6. Normal left fallopian tube. DESCRIPTION OF PROCEDURE:      After the patient was adequately counseled she was taken to the operating room and underwent successful general endotracheal anesthesia.  She was placed in the supine position and the abdomen was prepped and draped in the usual sterile fashion.  A Foley catheter was inserted into the bladder to monitor urine output.  A Pfannenstiel skin incision was made 2 cm above the symphysis pubis and the incision carried down through the skin and subcutaneous tissue down to the rectus fascia, whereby a midline niche was made.  The fascia was incised in transverse fashion and the midline raphe entered.  The peritoneal cavity was entered cautiously.  The OConnor-OSullivan retractors were in place and the patient was placed in Trendelenburg position.  At this time segments of small bowel were noted to be adhered to the anterior abdominal wall near the previous umbilical herniorrhaphy.  Attention was then turned to the pelvic cavity.  Systematic evaluation of the pelvis demonstrated a 4.5 x 3.5 cm anterior lower uterine fibroid underneath and near the bladder.  The left fallopian tube was normal with lush fimbriated end.  The right tube demonstrated an unruptured isthmic ectopic pregnancy, with a lush fimbriated end.  Both ovaries appeared polycystic.  A Babcock clamp was utilized to bring into view the ectopic pregnancy.  Pitressin was utilized for hemostasis in a 1:4 dilution, and approximately 5 cc was infiltrated into the tubal serosa.  With the [employer] electrical surgical generator Daphine Deutscher unit) set on settings of 7 and 7 respectively for cutting coagulation the instrument used, a linear salpingostomy was made.  The ectopic pregnancy was shelled out and passed off the operative field for histologic evaluation.  The edges were microcauterized and irrigated to keep the tissues moist in an effort to prevent future adhesions.  Following this attention was then turned to the anterior uterine fibroid,  whereby the base was infiltrated with Pitressin and with the [facility] unit the fibroid was shelled out.  In the uterine serosa individual bleeders were cauterized and Intercede placed to prevent future adhesions, as was the area of the right salpingostomy.  This was performed after the pelvic cavity was copiously irrigated with normal saline solution.  An intraoperative consultation was Dr. Luretha Murphy, general surgeon, was obtained and together we were able to gain adequate exposure and free the segment of small bowel that was adhered to the anterior abdominal wall where previous umbilical herniorrhaphy had taken place.  Additional small bowel segments that were adhered together were lysed with Metzenbaum scissors.  The area that had been adhered to the anterior abdominal wall had denuded  serosal segment and several interrupted sutures of 4-0 silk were utilized for additional hemostasis.  The bowel was placed back into the abdominal cavity, with the surgeon palpating underneath the abdominal cavity and the peritoneal cavity was free, and thus the visceral peritoneum was then reapproximated with running stitch of 3-0 Vicryl suture.  The rectus fascia was then closed with a running stitch of 0 Vicryl suture.  The subcutaneous bleeders were Bovie cauterized.  The skin was reapproximated with skin clips, followed by placement of Xeroform gauze and 4 x 8 dressing.  The patient was extubated and transferred to the recovery room with stable vital signs.  She did receive 1 g of Cefotan prophylactically.  IV fluids consisted of 2000 cc lactated Ringer.  Urine output was 100 cc and clear.  Estimated blood loss was 100 cc.  The patients blood type was O-positive. DD:  09/19/00 TD:  09/20/00 Job: [id number]

## 2011-04-17 NOTE — Discharge Summary (Signed)
   NAME:  Lisa [name], Lisa [name]                         ACCOUNT NO.:  [id number]   MEDICAL RECORD NO.:  [id number]                   PATIENT TYPE:  INP   LOCATION:  9108                                 FACILITY:  [facility]   PHYSICIAN:  Juan H. Lily Peer, M.D.             DATE OF BIRTH:  28-Jun-1961   DATE OF ADMISSION:  08/04/2002  DATE OF DISCHARGE:  08/07/2002                                 DISCHARGE SUMMARY   DISCHARGE DIAGNOSES:  1. Intrauterine pregnancy 38 weeks delivered.  2. Spontaneous rupture of membranes.  3. Active herpes simplex virus.  4. History of prior myomectomy.  5. Gestational diabetes.  6. Fibroid uterus currently.  Status post primary low uterine segment     transverse cesarean section by Dr. Reynaldo Minium on August 04, 2002.   HISTORY:  This is a 50 year old female with history of prior myomectomy,  cavity not entered, with an EDC of August 14, 2002.  She is a gravida 3,  para 0.  She developed gestational diabetes when she was pregnant.  She also  had a history of HSV on the buttocks and she had an anterior fibroid.  She  was also a smoker.   HOSPITAL COURSE:  On August 04, 2002 patient was admitted at 38 weeks with  spontaneous rupture of membranes.  She had active HSV.  Therefore, was  counseled and patient subsequently underwent a primary low uterine segment  transverse cesarean section by Dr. Reynaldo Minium and on August 04, 2002  underwent delivery of a female.  Apgars of 8 and 9.  Weight 7 pounds 8 ounces.  There were no complications.  Postoperatively the patient remained afebrile,  voiding, stable condition.  She was discharged home on August 07, 2002 in  satisfactory condition.   ACCESSORY CLINICAL FINDINGS:  Laboratories:  The patient is O+.  Rubella  immune.  On August 05, 2002 hemoglobin was 10.3.   DISPOSITION:  The patient was discharged to home.  Informed to return to  office in six weeks.  Any problems prior to that time to be seen in  the  office.  Given prescription for Tylox p.r.n. pain.     Susa Loffler, P.A.                    Juan H. Lily Peer, M.D.    TSG/MEDQ  D:  09/01/2002  T:  09/01/2002  Job:  [id number]

## 2011-04-17 NOTE — Discharge Summary (Signed)
[facility]  Patient:    Lisa [name], Lisa [name]                      MRN: [id number] Adm. Date:  [id number] Disc. Date: [id number] Attending:  Tonye Royalty Dictator:   Antony Contras, [facility]                           Discharge Summary  DISCHARGE DIAGNOSES:          1. Right isthmus ectopic pregnancy.                               2. Multi-cystic ovaries.                               3. Leiomyomatous uterus.                               4. Small-bowel adhesions.                               5. Postoperative ileus.  PROCEDURES:                   1. Exploratory laparotomy.                               2. Right linear salpingostomy.                               3. Myomectomy.                               4. Lysis of small-bowel adhesions.  HISTORY OF PRESENT ILLNESS:   The patient is a 50 year old, gravida 2, para 0, AB1, with  with reported LMP of August 28, and presented on September 18, 2000, at about 7-1/[redacted] weeks gestation.  Current pregnancy is the result of ovulation induction and she was found, in the office, to have a right ectopic pregnancy. She had had a Vabra aspiration where a pathology report demonstrated no evidence of chorionic villi.  Her quantitative beta hCG on October 7, was 290, on October 13, it was 417, and today, it was 401.  An ultrasound done today demonstrated a uterus measuring 13.9 x 7.7 x 7.8 cm with endometrial stripe at 2.1.  No intrauterine pregnancy.  Right adnexal complex area was seen.  She did present complaining of acute onset of mid-lower abdominal to slightly right lower quadrant pain.  She had some vaginal bleeding with passage of large clots.  She was admitted for exploratory laparotomy.  HOSPITAL COURSE/TREATMENT:    Exploratory laparotomy, right linear salpingostomy, myomectomy, lysis of small-bowel adhesions, was performed by Dr. Lily Peer.  Findings revealed a right nonruptured isthmus  ectopic pregnancy, anterior uterine fibroid 4.5 x 3.5 cm, polycystic ovaries, small-bowel segments adhered to the anterior abdominal wall, normal left fallopian tube.  The postoperative course was complicated by an increased amount of flatus and abdominal distention.  X-ray revealed postoperative ileus.  She was treated with an NG tube, which was able to be  removed on October 29.  She was able to be begin some clear liquids and was able to be discharged in satisfactory condition on September 23, 2000.  Postoperative CBC: Hematocrit was 35.1, hemoglobin 12.6, WBC 17.2, platelets 424.  The patient was able to be discharged in satisfactory condition.  DISPOSITION:                  She is to be followed up in the office and was discharged on Percocet for pain. DD:  10/18/00 TD:  10/18/00 Job: [id number]

## 2011-04-17 NOTE — H&P (Signed)
NAME:  Lisa [name], Lisa [name]                         ACCOUNT NO.:  [id number]   MEDICAL RECORD NO.:  [id number]                   PATIENT TYPE:  INP   LOCATION:  9175                                 FACILITY:  [facility]   PHYSICIAN:  Timothy P. [name], M.D.           DATE OF BIRTH:  Nov 11, 1961   DATE OF ADMISSION:  08/04/2002  DATE OF DISCHARGE:                                HISTORY & PHYSICAL   CHIEF COMPLAINT:  Rupture of membranes.   HISTORY OF PRESENT ILLNESS:  A 50 year old G3 P0 AB2 female at term  gestation with gross rupture of membranes and uterine cramping.  The patient  notes that about 7:30 a.m. this morning she had gross rupture of membranes  and subsequently has developed uterine cramping.  The prenatal course is  significant for a history of myomectomy in which the endometrial cavity was  not entered, and she was planned for a trial of labor.  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  She is also being followed for gestational diabetes, diet  controlled, with good glucose control.  The remainder is as per [employer].   PHYSICAL EXAMINATION:  VITAL SIGNS:  Afebrile, vital signs are stable.  ABDOMEN:  Benign.  Reactive fetal tracing with irregular contractions.  PELVIC:  Gross rupture of membranes.  Cervix is closed, high, vertex  presentation.  BUTTOCKS:  She has a cluster of vesicles at the upper portion of her  buttocks away from the perineum consistent with HSV versus herpes zoster.   ASSESSMENT AND PLAN:  A 50 year old G3 P0 AB2 female, term gestation,  rupture of membranes, prodromal labor contractions.  History of myomectomy  for a planned trial of labor.  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  I reviewed with the patient the issues of HSV exposure to the  fetus, rupture  of membranes, and the issues of severe neonatal infection.  This is away  from the perineum and the options of Covaderm coverage of this area to allow  for attempted vaginal delivery versus outright primary section was discussed  and offered to the patient.  The patient and her husband both want a trial  of labor and Covaderm coverage of this area.  They understand and accept  that there are no guarantees that the baby will not be exposed and that the  baby may develop a severe infection as a result of this, although again, it  is away from the perineum and I think it is reasonable for an attempted  vaginal delivery if they so desire, and we will tentatively plan on this.  I  also discussed with them that I am transferring call within the hour to Dr.  Lily Peer, and I will  discuss the case with him also to make sure that he is  comfortable with this plan.                                               Timothy P. Audie Box, M.D.    TPF/MEDQ  D:  08/04/2002  T:  08/04/2002  Job:  [id number]

## 2011-04-17 NOTE — Op Note (Signed)
NAME:  Lisa [name], Lisa [name]                         ACCOUNT NO.:  [id number]   MEDICAL RECORD NO.:  [id number]                   PATIENT TYPE:  AMB   LOCATION:  DAY                                  FACILITY[facility]  [facility]   PHYSICIAN:  Angelia Mould. Derrell Lolling, M.D.             DATE OF BIRTH:  Sep 01, 1961   DATE OF PROCEDURE:  01/08/2003  DATE OF DISCHARGE:                                 OPERATIVE REPORT   PREOPERATIVE DIAGNOSIS:  Recurrent ventral hernia.   POSTOPERATIVE DIAGNOSIS:  Recurrent ventral hernia.   OPERATION:  Laparoscopic repair  of recurrent ventral hernia with 15 x 19 cm  dual Gore-Tex mesh.   SURGEON:  Angelia Mould. Derrell Lolling, M.D.   FIRST ASSISTANT:  Vikki Ports, M.D.   INDICATIONS:  This is a 50 year old black female who has had a cesarean  section in the past, a laparoscopic cholecystectomy in the past. She  subsequently had a ventral hernia repair  through a midline incision below  the umbilicus several years ago. The hernia has recurred. She has been  having  progressive swelling and pain below the umbilicus.   On examination she is somewhat obese. She has a transverse incision below  her umbilicus and she has a vertical incision below her umbilicus about 8 to  9 cm in length. On standing and supine there is a hernia defect in the lower  midline about midway between the umbilicus and the pubis and the defect  feels at least 5 cm in diameter and the hernia sac is much larger but  completely reducible. She is brought to the operating room electively.   FINDINGS:  She had a loop of small intestine adherent to the rim of the  hernia defect which required sharp dissection to take this down. We did not  create any full thickness  injury to the intestine, but there might have  been a serosal injury, and so I choose to imbricate the small bowel serosa  with some Vicryl sutures. The umbilicus and the fascia was very thin and  weakened and I chose to overlap the mesh above  there. Everything else inside  looked fine. There were no other adhesions, no other disease process. The  defect in the abdominal wall was about 6  or 7 cm.   DESCRIPTION OF PROCEDURE:  Following the induction of general endotracheal  anesthesia a Foley catheter was inserted. The abdomen was prepped and draped  in a sterile fashion. I chose to put a Hasson trocar in the left flank.  About a 2-cm transverse incision was made. The abdominal wall was traversed  in a muscle splitting fashion and we entered the peritoneal cavity under  direct visualization. There were no adhesions in this area. I inserted the  10-mm Hasson trocar and secured this with a pursestring suture of 0 Vicryl  which incorporated the external oblique and internal oblique muscles.  Pneumoperitoneum was created. The  video camera was inserted into with the  visualization and findings as described above.   Then 5-mm trocars were placed in the right upper quadrant, left upper  quadrant right mid abdomen and left lower quadrant. With scissor dissection  we took down the small bowel adhesions to the rim of the hernia defect. A  serosal injury perhaps occurred. It was hard to tell. This could have simply  been inflammatory adhesions. Nevertheless, I chose to imbricate the small  bowel serosa with 2 interrupted sutures of 2-0 Vicryl using the Endostitch  device. This provided a very nice closure and there was no bleeding.   We marked out the hernia defect with a spinal needle and then we marked all  the way around this with a marking pending so we had at least a 3 to 4-cm  margin all the way around the hernia defect and around the umbilicus. We  used a 15 x 19 cm piece of dual Gore-Tex mesh. We marked the hernia defect  and the template on the skin and then placed 0 Novofil sutures in the mesh  at 8 equidistant points, marking the mesh and the skin with letters and  numbers for orientation. We rolled the mesh up and inserted it  into the  abdominal cavity.   Using the Endoclose device, we made puncture wounds at the 8 equidistant  points and passed the Novofil sutures back out through the abdominal wall,  being sure to grab a 1-cm bite of fascia. Once we had placed all of these  sutures and oriented the mesh, we made sure that the rough side of the mesh  was toward the peritoneum and the smooth side of the mesh was toward the  intestinal tract.   We then drew all the sutures up and the mesh up and the mesh spread out  quite nicely with no redundancy or overlapping. We tied all of these  sutures. We then used the 5-mm tacker, and using about 45 of the 5-mm screw  tacks, we secured the edges of the mesh and the center of the mesh to the  abdominal wall. We placed the 5-mm tacks 1 cm apart and were very careful to  make sure we did not leave any gaps where we could get entrapment of bowel.   We checked all the areas of dissection. There was no bleeding. A small  amount of blood in the abdominal cavity was evacuated. We checked the small  intestine, colon, liver, stomach and everything looked fine. The mesh  appeared to be secure.   All the trocars were removed under direct visualization. There was no  bleeding from the trocar sites. The pneumoperitoneum was released. The  fascia in the left flank was closed with 0 Vicryl suture. The skin incisions  were closed with a couple of staples and mostly Steri-Strips. Clean bandages  were placed.   The patient was then to the recovery room in stable condition.  Estimated blood loss was about 25 cc. There were no complications. All  sponge, instrument and needle counts were correct.                                               Angelia Mould. Derrell Lolling, M.D.    HMI/MEDQ  D:  01/08/2003  T:  01/08/2003  Job:  [id number]   cc:  Juan H. Lily Peer, M.D.  [address], [address]  [place]  [place] [address]  Fax: [phone]

## 2011-04-17 NOTE — H&P (Signed)
[facility]  Patient:    Lisa [name], Lisa [name]                      MRN: [id number] Adm. Date:  [id number] Disc. Date: [id number] Attending:  Wetzel Bjornstad                         History and Physical  CHIEF COMPLAINT:              Acute pelvic pain.  HISTORY:                      The patient is a 50 year old gravida 2, para 0, Ab1, with a reported last menstrual period of August 28th and currently would be 7-1/[redacted] weeks gestation.  Patients current pregnancy is a result of ovulation induction/Fertinex and had been followed in the office and was found to have a right ectopic pregnancy.  She had had an Vabra aspiration in the office on October 9th, whereby pathology report demonstrated no evidence of chorionic villi or fetal tissue identified.  Her quantitative beta hCG on October 7th was 290, on October 13th, it was 417 and todays quantitative beta hCG at [facility] was 401.  Ultrasound was done today that demonstrated a uterus measuring 13.9 x 7.7 x 7.8 cm, with an endometrial stripe of 2.1 cm. No intrauterine pregnancy was noted.  The right adnexal complex area could represent an ectopic.  Right ovary not identified separately.  Left ovary within normal limits.  Two fibroids as previously had been noted in the past from previous scan were reported by the radiologist and no free fluid was seen.  Patient presented to [facility] today complaining of acute onset of mid-lower abdominal to slightly right lower quadrant pain and had some vaginal bleeding with passage or large clot.  When she was evaluated in the emergency room, she was bending over in pain, had rebound and guarding, mid-low pelvis to the right lower quadrant.  There were bowel sounds present. Pelvic examination had demonstrated cervical motion tenderness and tenderness was elicited more in the right lower quadrant.  MEDICATIONS:                  Patient is on Prevacid, Allegra and  Vicoprofen.  ALLERGIES:                    She is not allergic to any medications.  PAST MEDICAL HISTORY:         Has had history of secondary infertility.  She had a laparoscopy in 1997 and was diagnosed with endometriosis.  Subsequently, she had separation of her incision and had an umbilical hernia requiring repair; she has also had cholecystectomy in the past.  FAMILY HISTORY:               Significant for hypertension and diabetes.  PHYSICAL EXAMINATION  VITAL SIGNS:                  Temperature 98.8, pulse 99, respirations 22, blood pressure 146/77.  GENERAL APPEARANCE:           Patient is in acute distress in the fetal position, complaining of severe abdominal pains.  HEENT:                        Unremarkable.  NECK:  Supple.  Trachea midline.  No carotid bruits. No thyromegaly.  LUNGS:                        Clear to auscultation, without rhonchi or wheezes.  HEART:                        Regular rate and rhythm.  No murmurs or gallops.  BREASTS:                      Examination not done.  ABDOMEN:                      Soft.  Rebound and guarding.  Point tenderness, mid-suprapubic to right lower quadrant, was noted.  Faint bowel sounds were heard.  PELVIC:                       There was some blood in the vaginal vault and some clots.  Uterus with an anterior fibroid palpated to the uterus and also tenderness on the right lower quadrant but none on the left lower quadrant.  RECTAL:                       Deferred.  LABORATORY DATA:              Ultrasound, as described above.                                Patients blood type is O-positive.  Her hemoglobin was 13.3 and hematocrit was 37.4, respectively.  Platelet count 476,000.  White blood count was 13.9.  Quantitative beta hCG was 401 mIU.  ASSESSMENT:                   [age]-year-old gravida 2, para 0, abortus 1, 7-1/2 weeks estimated gestational age, with high suspicion for  a right ectopic pregnancy, leveling off of quantitative beta human chorionic gonadotropin, acute abdomen.  Plan on proceeding with exploratory laparotomy; rule out ectopic pregnancy.  Patient was counseled as potential scenarios to include salpingostomy, salpingectomy, salpingo-oophorectomy, myomectomy, pelvic adhesiolysis.  In the event of any uncontrollable hemorrhage, if she were to need a blood transfusion, she is aware of potential risks such as anaphylactic reaction, and hepatitis and acquired immunodeficiency syndrome were discussed.  Also as a lifesaving measure, she could potentially lose her reproductive organs and not be able to have any more children.  All of this was discussed with the patient and her husband.  Also, potential for intra-abdominal trauma requiring corrective surgery such as injury to the bowel, bladder and blood vessels or nerves.  All this was discussed with the patient and her husband and all questions were answered and will follow accordingly.  PLAN:                         Patient is scheduled for emergency exploratory laparotomy. DD:  09/19/00 TD:  09/19/00 Job: [id number]

## 2011-04-17 NOTE — Discharge Summary (Signed)
Lisa [name], Lisa [name]               ACCOUNT NO.:  [id number]   MEDICAL RECORD NO.:  [id number]          PATIENT TYPE:  INP   LOCATION:  9137                          FACILITY:  [facility]   PHYSICIAN:  Freddy Finner, M.D.   DATE OF BIRTH:  1960/12/24   DATE OF ADMISSION:  09/14/2008  DATE OF DISCHARGE:  09/17/2008                               DISCHARGE SUMMARY   ADMITTING DIAGNOSES:  1. Intrauterine pregnancy at 36-5/7 weeks' estimated gestational age.  2. Previous cesarean section, desires repeat.  3. Multiparity desires permanent sterilization.  4. Chronic hypertension with increasing shortness of breath and edema.   DISCHARGE DIAGNOSES:  1. Status post low transverse cesarean section.  2. A viable female infant.  3. Permanent sterilization.   PROCEDURES:  1. Repeat low transverse cesarean section.  2. Bilateral tubal ligation.   REASON FOR ADMISSION:  Please see written H&P.   HOSPITAL COURSE:  The patient is a 50 year old, gravida 3, para 1, that  presented to [facility] at 36 weeks and 5 days for  cesarean delivery.  The patient's medical history was significant for  chronic hypertension followed by Dr. Elease Hashimoto and Dr. Patty Sermons.  The  patient had been on atenolol throughout her pregnancy.  She had been  unable to tolerate labetalol or any other medications such as metoprolol  or Aldomet.  The patient did have a history of seasonal asthma.  The  patient had progressively developed shortness of breath, dyspnea, and  edema throughout her pregnancy, which had worsened over the past few  weeks.  The patient also had a history of a sleep apnea which she sees  Dr. Elease Hashimoto for.  Her blood pressures have been fairly under control with  the antihypertensive medication.  The patient did undergo amniocentesis  because of the progression of her symptoms, which had revealed an LS  ratio of 3.721 with PG.  The patient was now scheduled for a repeat low  transverse cesarean  section and bilateral tubal ligation.  On the  morning of admission, the patient was taken to operating room, where  spinal anesthesia was administered without difficulty.  A low transverse  incision was made with delivery of a viable female infant weighing 6  pounds 6 ounces with Apgars of 8 at 1 minute and 9 at 5 minutes.  The  patient tolerated the procedure well and was taken to the recovery room  in stable condition.  Postoperative day #1, the patient was doing okay.  She did complain of some shortness of breath.  Vital signs were stable.  The urine output was low and concentrated.  Lungs were clear to  auscultation bilaterally.  Dressing was clean, dry and intact.  Extremities did reveal 2+ pedal edema to the thighs.  Laboratory  findings showed hemoglobin of 10.0, platelet count of 265,000, and WBC  count of 9.6.  The patient was given some Lasix for assistance with  diuresis.  On postoperative day #2, the patient was without complaint.  Vital signs remained stable.  She is afebrile.  Abdomen is soft and  nontender.  Abdominal dressing  was noted to be clean, dry, and intact.  The patient continued on atenolol.  On postoperative day #3, the patient  denied headache, blurred vision, or right upper quadrant pain.  She  denied palpitations or shortness of breath.  Vital signs were stable.  Blood pressure 120/78-150/84.  Deep tendon reflexes 1+, no clonus, 1+  edema was noted.  Abdomen was soft, slightly distended, positive bowel  sounds, positive flatus, and positive BM .  Fundus was firm and  nontender.  Incision was clean, dry, and intact.  Staples were left and  intact.   DISCHARGE INSTRUCTIONS:  Reviewed and the patient was later discharged  home.   CONDITION ON DISCHARGE:  Stable.   DIET:  Regular, as tolerated.   ACTIVITY:  No heavy lifting, no driving x2 weeks, and no vaginal entry.   FOLLOW UP:  The patient is to follow up in the office in 2-3 days for  staple removal and  a blood pressure check.  She is to call for  temperature greater than 100 degrees, persistent nausea, vomiting, heavy  vaginal bleeding and/or redness, or drainage from the incisional site.  The patient was also instructed to call for headache, blurred vision, or  right upper quadrant pain.   DISCHARGE MEDICATIONS:  1. Motrin 600 mg every 6 hours.  2. Atenolol 25 mg 1 p.o. b.i.d.  3. Prenatal vitamins 1 p.o. daily.      Julio Sicks, N.P.      Freddy Finner, M.D.  Electronically Signed    CC/MEDQ  D:  10/09/2008  T:  10/09/2008  Job:  [id number]

## 2011-05-12 ENCOUNTER — Other Ambulatory Visit: Payer: Self-pay | Admitting: Cardiovascular Disease

## 2011-05-12 NOTE — Telephone Encounter (Signed)
Med refill. Pt last seen in office 04/22/2010. No further refills til seen in office.

## 2011-08-17 ENCOUNTER — Telehealth: Payer: Self-pay | Admitting: Cardiovascular Disease

## 2011-08-17 MED ORDER — METOPROLOL SUCCINATE ER 50 MG PO TB24: 50.0000 mg | ORAL_TABLET | Freq: Every day | ORAL | Status: DC

## 2011-08-17 MED ORDER — AMLODIPINE BESYLATE 5 MG PO TABS: 5.0000 mg | ORAL_TABLET | Freq: Every day | ORAL | Status: DC

## 2011-08-17 NOTE — Telephone Encounter (Signed)
Amlodipine 5mg  and metoprolol 50mg , uses [facility], pt out needs asap

## 2011-08-17 NOTE — Telephone Encounter (Signed)
Not seen in over a year, needs ov or can have PCP refill. msg left

## 2011-08-20 LAB — POCT CARDIAC MARKERS
CKMB, poc: 2.9
CKMB, poc: <1 — ABNORMAL LOW
Myoglobin, poc: 65.5
Operator id: 284251

## 2011-08-20 LAB — POCT I-STAT CREATININE: Operator id: 284251

## 2011-08-20 LAB — BASIC METABOLIC PANEL
BUN: 15
CO2: 27
GFR calc non Af Amer: >60
Glucose, Bld: 123 — ABNORMAL HIGH
Potassium: 3.7

## 2011-08-20 LAB — CK TOTAL AND CKMB (NOT AT ARMC)
Relative Index: INVALID
Total CK: 77
Total CK: 85

## 2011-08-20 LAB — I-STAT 8, (EC8 V) (CONVERTED LAB)
Acid-base deficit: 3 — ABNORMAL HIGH
Chloride: 107
HCT: 46
Operator id: 284251
Potassium: 3.9

## 2011-08-20 LAB — DIFFERENTIAL
Basophils Absolute: 0
Basophils Relative: 0
Eosinophils Absolute: 0.1
Eosinophils Relative: 1
Monocytes Absolute: 0.6

## 2011-08-20 LAB — APTT: aPTT: 32

## 2011-08-20 LAB — CBC
HCT: 40.3
HCT: 42.5
Hemoglobin: 13.7
Hemoglobin: 14.3
MCHC: 33.6
MCHC: 34.1
MCV: 89.9
Platelets: 308
RBC: 4.71
RDW: 13.4
RDW: 13.7
WBC: 8.7

## 2011-08-27 LAB — COMPREHENSIVE METABOLIC PANEL
ALT: 32
AST: 22
CO2: 23
Calcium: 9.6
Chloride: 106
Creatinine, Ser: 0.54
GFR calc Af Amer: >60
GFR calc non Af Amer: >60
Glucose, Bld: 97
Sodium: 134 — ABNORMAL LOW
Total Bilirubin: 0.4

## 2011-08-27 LAB — CBC
HCT: 33.1 — ABNORMAL LOW
Hemoglobin: 11.7 — ABNORMAL LOW
Hemoglobin: 11.4 — ABNORMAL LOW
MCHC: 35.3
MCHC: 35
MCV: 91.9
MCV: 93.4
RBC: 3.6 — ABNORMAL LOW
RBC: 3.47 — ABNORMAL LOW
WBC: 7.7

## 2011-08-27 LAB — URINALYSIS, ROUTINE W REFLEX MICROSCOPIC
Bilirubin Urine: NEGATIVE
Glucose, UA: NEGATIVE
Protein, ur: NEGATIVE
Urobilinogen, UA: 1

## 2011-08-27 LAB — DIFFERENTIAL
Basophils Relative: 0
Eosinophils Absolute: 0.1
Eosinophils Relative: 1
Lymphs Abs: 1.6
Monocytes Absolute: 0.4
Monocytes Relative: 6

## 2011-08-28 LAB — URINALYSIS, ROUTINE W REFLEX MICROSCOPIC
Bilirubin Urine: NEGATIVE
Ketones, ur: NEGATIVE
Leukocytes, UA: NEGATIVE
Nitrite: NEGATIVE
Urobilinogen, UA: 0.2
pH: 5.5

## 2011-08-28 LAB — CBC
HCT: 32.5 — ABNORMAL LOW
Hemoglobin: 11.2 — ABNORMAL LOW
MCHC: 34.5
MCV: 93.7
RBC: 3.47 — ABNORMAL LOW
RDW: 13.2

## 2011-08-31 LAB — URINALYSIS, ROUTINE W REFLEX MICROSCOPIC
Nitrite: NEGATIVE
Protein, ur: NEGATIVE
Urobilinogen, UA: 0.2

## 2011-08-31 LAB — CBC
Hemoglobin: 13.2
Hemoglobin: 13.3
MCHC: 32.8
MCHC: 33.6
MCV: 95.7
MCV: 97.2
Platelets: 265
Platelets: 302
RBC: 3.11 — ABNORMAL LOW
RBC: 4.14
RDW: 13.5
RDW: 13.8
WBC: 9.6

## 2011-08-31 LAB — COMPREHENSIVE METABOLIC PANEL
CO2: 25
Calcium: 9.4
Creatinine, Ser: 0.78
GFR calc Af Amer: >60
GFR calc non Af Amer: >60
Glucose, Bld: 83
Total Protein: 7.2

## 2011-08-31 LAB — URIC ACID: Uric Acid, Serum: 5.6

## 2011-08-31 LAB — CARBOXYHEMOGLOBIN
Carboxyhemoglobin: 2.1 — ABNORMAL HIGH
Methemoglobin: 0.2

## 2011-08-31 LAB — BLOOD GAS, ARTERIAL
Acid-Base Excess: 1.6
Bicarbonate: 26.1 — ABNORMAL HIGH
Drawn by: 28678
FIO2: 0.21
O2 Saturation: 100
O2 Saturation: 96.1
pCO2 arterial: 40.6
pH, Arterial: 7.425 — ABNORMAL HIGH
pO2, Arterial: 45 — ABNORMAL LOW
pO2, Arterial: 80.7

## 2011-08-31 LAB — LACTATE DEHYDROGENASE: LDH: 152

## 2011-08-31 LAB — TROPONIN I
Troponin I: 0.03
Troponin I: <0.01

## 2011-08-31 LAB — GLUCOSE, CAPILLARY
Glucose-Capillary: 83
Glucose-Capillary: 92

## 2011-08-31 LAB — RPR: RPR Ser Ql: NONREACTIVE

## 2011-08-31 LAB — URINE MICROSCOPIC-ADD ON

## 2011-08-31 LAB — CK TOTAL AND CKMB (NOT AT ARMC): Relative Index: INVALID

## 2011-08-31 LAB — CARDIAC PANEL(CRET KIN+CKTOT+MB+TROPI)
Relative Index: INVALID
Total CK: 52

## 2011-09-02 LAB — COMPREHENSIVE METABOLIC PANEL
ALT: 21
CO2: 25
Calcium: 9.6
Creatinine, Ser: 0.63
GFR calc non Af Amer: >60
Glucose, Bld: 92
Total Bilirubin: 0.4

## 2011-09-02 LAB — CBC
HCT: 34.8 — ABNORMAL LOW
Hemoglobin: 11.8 — ABNORMAL LOW
MCHC: 34
MCV: 95.3
RBC: 3.65 — ABNORMAL LOW

## 2011-09-02 LAB — URINALYSIS, ROUTINE W REFLEX MICROSCOPIC
Bilirubin Urine: NEGATIVE
Ketones, ur: NEGATIVE
Protein, ur: NEGATIVE
Urobilinogen, UA: 1

## 2011-09-02 LAB — LACTATE DEHYDROGENASE: LDH: 102

## 2011-09-07 ENCOUNTER — Telehealth: Payer: Self-pay | Admitting: Cardiovascular Disease

## 2011-09-07 ENCOUNTER — Ambulatory Visit: Payer: Self-pay | Admitting: Cardiovascular Disease

## 2011-09-07 MED ORDER — METOPROLOL SUCCINATE ER 50 MG PO TB24: 50.0000 mg | ORAL_TABLET | Freq: Every day | ORAL | Status: DC

## 2011-09-07 MED ORDER — AMLODIPINE BESYLATE 5 MG PO TABS: 5.0000 mg | ORAL_TABLET | Freq: Every day | ORAL | Status: DC

## 2011-09-07 NOTE — Telephone Encounter (Signed)
Amlodipine and metoprolol refill needed got message last week and didn't understand message , pls call back

## 2011-09-07 NOTE — Telephone Encounter (Signed)
Called pt app made

## 2011-09-11 LAB — POCT PREGNANCY, URINE
Operator id: 22937
Preg Test, Ur: NEGATIVE

## 2011-09-23 ENCOUNTER — Encounter: Payer: Self-pay | Admitting: Cardiovascular Disease

## 2011-10-08 ENCOUNTER — Encounter: Payer: Self-pay | Admitting: Cardiovascular Disease

## 2011-10-12 ENCOUNTER — Ambulatory Visit (INDEPENDENT_AMBULATORY_CARE_PROVIDER_SITE_OTHER): Payer: Self-pay | Admitting: Cardiovascular Disease

## 2011-10-12 ENCOUNTER — Encounter: Payer: Self-pay | Admitting: Cardiovascular Disease

## 2011-10-12 DIAGNOSIS — R0789 Other chest pain: Secondary | ICD-10-CM | POA: Insufficient documentation

## 2011-10-12 DIAGNOSIS — R079 Chest pain, unspecified: Secondary | ICD-10-CM

## 2011-10-12 DIAGNOSIS — I10 Essential (primary) hypertension: Secondary | ICD-10-CM | POA: Insufficient documentation

## 2011-10-12 DIAGNOSIS — I152 Hypertension secondary to endocrine disorders: Secondary | ICD-10-CM | POA: Insufficient documentation

## 2011-10-12 MED ORDER — METOPROLOL SUCCINATE ER 50 MG PO TB24: 50.0000 mg | ORAL_TABLET | Freq: Every day | ORAL | Status: DC

## 2011-10-12 MED ORDER — AMLODIPINE BESYLATE 5 MG PO TABS: 5.0000 mg | ORAL_TABLET | Freq: Every day | ORAL | Status: DC

## 2011-10-12 NOTE — Progress Notes (Signed)
Lisa [name] Date of Birth  [birth year]  [facility] [address]    [address] [place], [place]  [address] [phone]  Fax  [phone]  History of Present Illness:  Lisa [name] Is a 50 year old female with a history of hypertension. He's had some occasional episodes of chest discomfort.  These episodes of chest pain occur at times and are not necessarily associated with eating, drinking, change of position, or exercise.  She is walking on the treadmill 3 times a week.  She does not have chest pain while she is walking.  Current Outpatient Prescriptions on File Prior to Visit  Medication Sig Dispense Refill  . amLODipine (NORVASC) 5 MG tablet Take 1 tablet (5 mg total) by mouth daily.  30 tablet  0  . metoprolol (TOPROL-XL) 50 MG 24 hr tablet Take 1 tablet (50 mg total) by mouth daily.  30 tablet  0    No Known Allergies  Past Medical History  Diagnosis Date  . Hypertension   . Chest pain, non-cardiac   . Obesity   . Cigarette smoker     Past Surgical History  Procedure Date  . Cardiac catheterization 12/28/2007    EF 75%. IT REVEALS NORMAL/SUPRANORMAL LEFT VENTRICULAR SYSTOLIC FUNCTION  . Cesarean section   . Cholecystectomy   . Laparoscopy   . Hernia repair   . US echocardiography 05/08/2008    EF 55-60%  . Cardiovascular stress test 03/25/2007    EF 78%    History  Smoking status  . Current Everyday Smoker -- 0.5 packs/day  Smokeless tobacco  . Not on file    History  Alcohol Use No    Family History  Problem Relation Age of Onset  . Hypertension Mother     Reviw of Systems:  Reviewed in the HPI.  All other systems are negative.  Physical Exam: BP 146/94  Pulse 81  Ht 5' 5.5" (1.664 m)  Wt 255 lb 1.9 oz (115.722 kg)  BMI 41.81 kg/m2 The patient is alert and oriented x 3.  The mood and affect are normal.   Skin: warm and dry.  Color is normal.    HEENT:   Normal carotids, no JVD, neck is supple  Lungs: clear   Heart: RR, normal      Abdomen: + BS, no HSM  Extremities:  No c/c/w   Neuro:  Non focal     ECG: NSR.Marland Kitchen No ST or T wave abn.  Assessment / Plan:

## 2011-10-12 NOTE — Assessment & Plan Note (Signed)
Presents with some episodes of chest discomfort. She's had a cardiac catheterization several years ago that did not reveal any significant coronary artery irregularities. In addition she still smokes.  I've encouraged him to start with a good exercise program. In the past she has not had any episodes of chest pain with exertion but they seem to occur with mental stress. If she is able to exercise without any chest pains that would be less inclined to do further testing.  On the other hand she has continued chest pains or, I would've a low threshold to proceed with cardiac catheterization or stress testing. I've encouraged her to stop smoking. We will give her the smoking cessation consult number at [facility].

## 2011-10-12 NOTE — Patient Instructions (Signed)
Your physician wants you to follow-up in: 6 months You will receive a reminder letter in the mail two months in advance. If you don't receive a letter, please call our office to schedule the follow-up appointment.  Your physician discussed the hazards of tobacco use. Tobacco use cessation is recommended and techniques and options to help you quit were discussed.  Call [facility] for classes to stop smoking: [phone] or [phone] main number)

## 2012-02-11 ENCOUNTER — Encounter (INDEPENDENT_AMBULATORY_CARE_PROVIDER_SITE_OTHER): Payer: Self-pay | Admitting: General Surgery

## 2012-02-11 ENCOUNTER — Ambulatory Visit (INDEPENDENT_AMBULATORY_CARE_PROVIDER_SITE_OTHER): Payer: Managed Care, Other (non HMO) | Admitting: General Surgery

## 2012-02-11 VITALS — BP 130/81 | HR 78 | Temp 97.8°F | Ht 65.5 in | Wt 250.4 lb

## 2012-02-11 DIAGNOSIS — K432 Incisional hernia without obstruction or gangrene: Secondary | ICD-10-CM

## 2012-02-11 DIAGNOSIS — G4730 Sleep apnea, unspecified: Secondary | ICD-10-CM

## 2012-02-11 NOTE — Progress Notes (Deleted)
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 She initially had a lot of discomfort and a feeling that there was a lump or swelling at her anus. She was seen by Dr. Luisa Hart and he could not find a specific complication. In recent weeks she has been feeling steadily better. She is used occasional MiraLax which has helped. At this point she is having bowel movements without pain or bleeding and she feels that the lump or swelling is gone.  Exam: Extra rectal exam shows the fissure is apparently healed although still mildly tender in the sphincterotomy site appears healed.  Assessment plan: At this point I think she is doing well without complication. I did not give her a return appointment but asked her to call at any point as needed.

## 2012-02-11 NOTE — Progress Notes (Signed)
Subjective:   Increasing pain from hernia  Patient ID: Lisa [name], female   DOB: 02-Nov-1961, 51 y.o.   MRN: [id number]  HPI Patient returns to the office for followup of her ventral incisional hernia. She has tried a couple of times to pursue bariatric surgery but unfortunately due to balance his hospital and financial issues she is unable to get this done. She is having increasing symptoms from her upper midline incisional hernia. She now gets episodic pain and nausea and tenderness. She is sometimes unable to push the lump back in her abdomen. She also gets some diarrhea and constipation intermittently. She also currently is having problems with severe headaches and workup is underway with an MRI scheduled today by her primary doctor Kathrynn Running.  Past Medical History  Diagnosis Date  . Hypertension   . Chest pain, non-cardiac   . Obesity   . Cigarette smoker    Past Surgical History  Procedure Date  . Cardiac catheterization 12/28/2007    EF 75%. IT REVEALS NORMAL/SUPRANORMAL LEFT VENTRICULAR SYSTOLIC FUNCTION  . Cesarean section   . Cholecystectomy   . Laparoscopy   . Hernia repair   . US echocardiography 05/08/2008    EF 55-60%  . Cardiovascular stress test 03/25/2007    EF 78%  . Endometrial ablation 09/2010   Current Outpatient Prescriptions  Medication Sig Dispense Refill  . amLODipine (NORVASC) 5 MG tablet Take 1 tablet (5 mg total) by mouth daily.  30 tablet  5  . HYDROcodone-acetaminophen (NORCO) 5-325 MG per tablet       . metoprolol (TOPROL-XL) 50 MG 24 hr tablet Take 1 tablet (50 mg total) by mouth daily.  30 tablet  5   No Known Allergies    Review of Systems  Constitutional: Negative.   Respiratory: Negative.   Cardiovascular: Negative.   Gastrointestinal: Positive for nausea, abdominal pain, diarrhea, constipation and abdominal distention.  Neurological: Positive for headaches. Negative for weakness and numbness.       Objective:   Physical Exam BP 130/81   Pulse 78  Temp(Src) 97.8 F (36.6 C) (Temporal)  Ht 5' 5.5" (1.664 m)  Wt 250 lb 6.4 oz (113.581 kg)  BMI 41.04 kg/m2  SpO2 98% General: A morbidly obese Afro-American female in no distress Skin: Warm and dry no rash or infection HEENT: No palpable mass or thyromegaly. Sclera nonicteric. Oropharynx clear. Lungs: Breath sounds clear without increased work of breathing or wheezing Cardiovascular: Regular rate and rhythm without murmur. No edema. Abdomen: Central obesity. The patient to incident maneuver there is a fairly large upper midline diastases. This is not apparent understanding. There is however a discrete hernia mass about 5 cm in diameter by 5 cm above her umbilicus at a previous laparoscopic port incision. I suspect the actual defect was fairly small. It is tender and partially reducible. No other masses organomegaly and no hernias in her lower abdomen palpable. Extremities: No deformity or edema Neuro: Alert and fully oriented. Gait normal.    Assessment:     Increasingly symptomatic incisional hernia had a previous laparoscopic port site above the umbilicus. We had previously discussed bariatric surgery as her weight is certainly an issue regarding this hernia repaired but does not appear that this is going to be feasible for her. I think her hernia will need to be repaired. She does not like the diastases she has but I discussed with her that this is really not in acute medical problem and she has a smaller discrete hernia  that is causing her symptoms. I would not recommend repair for diastases as I think this would have to be done open with a large incision and with her morbid obesity and cigarette smoking she would be at high risk for complications. I think we will need to proceed with laparoscopic repair of her more discrete hernia.  We will need preoperative cardiac clearance by Dr. Elease Hashimoto. We will need to await the results of her headache workup. She will call me back after  this and we will get her surgery scheduled. We discussed the nature of surgery, expected recovery, risks of anesthetic complications, bleeding, infection, intestinal injury, and recurrence.    Plan:     Laparoscopic repair of upper abdominal incisional hernia and general anesthesia. We'll schedule after her current workup as above .

## 2012-02-11 NOTE — Progress Notes (Signed)
Addended by: Glenna Fellows T on: 02/11/2012 10:26 AM   Modules accepted: Level of Service

## 2012-02-15 ENCOUNTER — Encounter (INDEPENDENT_AMBULATORY_CARE_PROVIDER_SITE_OTHER): Payer: Self-pay

## 2012-02-17 ENCOUNTER — Encounter: Payer: Self-pay | Admitting: *Deleted

## 2012-02-24 ENCOUNTER — Telehealth (INDEPENDENT_AMBULATORY_CARE_PROVIDER_SITE_OTHER): Payer: Self-pay

## 2012-02-24 NOTE — Telephone Encounter (Signed)
Cardiac clearance rec'd for [name]

## 2012-03-16 ENCOUNTER — Other Ambulatory Visit: Payer: Self-pay | Admitting: Cardiovascular Disease

## 2012-03-16 MED ORDER — AMLODIPINE BESYLATE 5 MG PO TABS: 5.0000 mg | ORAL_TABLET | Freq: Every day | ORAL | Status: DC

## 2012-03-17 ENCOUNTER — Other Ambulatory Visit: Payer: Self-pay | Admitting: *Deleted

## 2012-03-17 MED ORDER — AMLODIPINE BESYLATE 5 MG PO TABS: 5.0000 mg | ORAL_TABLET | Freq: Every day | ORAL | Status: DC

## 2012-03-22 ENCOUNTER — Encounter: Payer: Self-pay | Admitting: Gastroenterology

## 2012-04-11 ENCOUNTER — Other Ambulatory Visit: Payer: Self-pay | Admitting: *Deleted

## 2012-04-11 MED ORDER — METOPROLOL SUCCINATE ER 50 MG PO TB24: 50.0000 mg | ORAL_TABLET | Freq: Every day | ORAL | Status: DC

## 2012-04-12 ENCOUNTER — Other Ambulatory Visit: Payer: Self-pay | Admitting: *Deleted

## 2012-04-13 ENCOUNTER — Ambulatory Visit (INDEPENDENT_AMBULATORY_CARE_PROVIDER_SITE_OTHER): Payer: Managed Care, Other (non HMO) | Admitting: Gastroenterology

## 2012-04-13 ENCOUNTER — Encounter: Payer: Self-pay | Admitting: Gastroenterology

## 2012-04-13 VITALS — BP 150/84 | HR 84 | Ht 65.75 in | Wt 254.2 lb

## 2012-04-13 DIAGNOSIS — R195 Other fecal abnormalities: Secondary | ICD-10-CM

## 2012-04-13 DIAGNOSIS — K589 Irritable bowel syndrome without diarrhea: Secondary | ICD-10-CM

## 2012-04-13 DIAGNOSIS — R0989 Other specified symptoms and signs involving the circulatory and respiratory systems: Secondary | ICD-10-CM

## 2012-04-13 DIAGNOSIS — F458 Other somatoform disorders: Secondary | ICD-10-CM

## 2012-04-13 MED ORDER — OMEPRAZOLE 20 MG PO CPDR: DELAYED_RELEASE_CAPSULE | ORAL | Status: DC

## 2012-04-13 MED ORDER — MOVIPREP 100 G PO SOLR: 1.0000 | Freq: Once | ORAL | Status: DC

## 2012-04-13 NOTE — Patient Instructions (Addendum)
You have been given a separate informational sheet regarding your tobacco use, the importance of quitting and local resources to help you quit.. You have been scheduled for an endoscopy and colonoscopy with propofol. Please follow the written instructions given to you at your visit today. Please pick up your prep at the pharmacy within the next 1-3 days. Take your omeprazole one tablet by mouth 30 mins before dinner. Patient advised to avoid spicy, acidic, citrus, chocolate, mints, fruit and fruit juices.  Limit the intake of caffeine, alcohol and Soda.  Don't exercise too soon after eating.  Don't lie down within 3-4 hours of eating.  Elevate the head of your bed. cc: Marye Round, MD

## 2012-04-13 NOTE — Progress Notes (Addendum)
History of Present Illness: This is a 51 year old female who has a long history of irritable bowel syndrome and notes frequently alternating diarrhea and constipation associated with abdominal discomfort. Her symptoms have improved steadily over the years. She underwent upper endoscopy in 1994 showing GERD and gastritis and colonoscopy in 1999 showing fairly extensive diverticulosis. Recently she has had a nighttime cough and a sensation of something stuck in her throat. She also has intermittent chest pains that are exacerbated by movement twisting or bending. Around April 21 she had one day of black stools. Denies weight loss, change in stool caliber, melena, hematochezia, vomiting, dysphagia, chest pain.  Allergies  Allergen Reactions  . Percocet (Oxycodone-Acetaminophen)    Outpatient Prescriptions Prior to Visit  Medication Sig Dispense Refill  . amLODipine (NORVASC) 5 MG tablet Take 1 tablet (5 mg total) by mouth daily.  90 tablet  1  . HYDROcodone-acetaminophen (NORCO) 5-325 MG per tablet       . metoprolol succinate (TOPROL-XL) 50 MG 24 hr tablet Take 1 tablet (50 mg total) by mouth daily.  90 tablet  1   Past Medical History  Diagnosis Date  . Hypertension   . Chest pain, non-cardiac   . Obesity   . Cigarette smoker   . IBS (irritable bowel syndrome)   . GERD (gastroesophageal reflux disease)   . Diverticulosis   . Pyloric erosion   . Allergic rhinitis   . Chronic headaches   . Sleep apnea    Past Surgical History  Procedure Date  . Cardiac catheterization 12/28/2007    EF 75%. IT REVEALS NORMAL/SUPRANORMAL LEFT VENTRICULAR SYSTOLIC FUNCTION  . Cesarean section 2003, 2009    x 2  . Cholecystectomy 1993  . Laparoscopy   . Umbilical hernia repair 2004  . US echocardiography 05/08/2008    EF 55-60%  . Cardiovascular stress test 03/25/2007    EF 78%  . Endometrial ablation 09/2010    and D&C   History   Social History  . Marital Status: Married    Spouse Name: N/A   Number of Children: 2  . Years of Education: N/A   Occupational History  . 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 Smoking status: Current Everyday Smoker -- 1.0 packs/day    Types: Cigarettes  . Smokeless tobacco: Never Used  . Alcohol Use: No  . Drug Use: No  . Sexually Active: None   Other Topics Concern  . None   Social History Narrative  . None   Family History  Problem Relation Age of Onset  . Hypertension Mother   . Diabetes Maternal Grandmother     Review of Systems: Pertinent positive and negative review of systems were noted in the above HPI section. All other review of systems were otherwise negative.   Physical Exam: General: Well developed , well nourished, obese, no acute distress Head: Normocephalic and atraumatic Eyes:  sclerae anicteric, EOMI Ears: Normal auditory acuity Mouth: No deformity or lesions Neck: Supple, no masses or thyromegaly Lungs: Clear throughout to auscultation Heart: Regular rate and rhythm; no murmurs, rubs or bruits Abdomen: Soft, non tender and non distended. Large, protuberant abdomen. No masses, hepatosplenomegaly noted. Incisional hernia. Normal Bowel sounds Rectal: no lesions, heme + brown stool in the vault Musculoskeletal: Symmetrical with no gross deformities  Skin: No lesions on visible extremities Pulses:  Normal pulses noted Extremities: No clubbing, cyanosis, edema or deformities noted Neurological: Alert oriented x 4, grossly  nonfocal Cervical Nodes:  No significant cervical adenopathy Inguinal Nodes: No significant inguinal adenopathy Psychological:  Alert and cooperative. Normal mood and affect  Assessment and Recommendations:  1. Globus and probable GERD. Continue omeprazole 20 mg before her evening meal. The risks, benefits, and alternatives to endoscopy with possible biopsy and possible dilation were discussed with the patient and they consent to proceed. Consider increasing omeprazole to  twice a day pending findings at upper endoscopy.  2. Heme + stool, possible history of melena. Schedule colonosocpy and EGD. The risks, benefits, and alternatives to colonoscopy with possible biopsy and possible polypectomy were discussed with the patient and they consent to proceed.   3. IBS. High fiber diet. Consider adding a probiotic following endoscopy and colonoscopy.  4. Diverticulosis. See problem #3.  5. Sleep apnea.  6. Obesity  7. Ventral hernia. Surgical repair planned by Dr. Johna Sheriff.

## 2012-04-14 ENCOUNTER — Encounter: Payer: Self-pay | Admitting: Gastroenterology

## 2012-04-14 ENCOUNTER — Other Ambulatory Visit: Payer: Self-pay | Admitting: Cardiovascular Disease

## 2012-04-14 NOTE — Telephone Encounter (Signed)
Out of pills 

## 2012-04-26 ENCOUNTER — Other Ambulatory Visit: Payer: Self-pay | Admitting: Obstetrics and Gynecology

## 2012-04-29 ENCOUNTER — Telehealth (INDEPENDENT_AMBULATORY_CARE_PROVIDER_SITE_OTHER): Payer: Self-pay

## 2012-04-29 ENCOUNTER — Ambulatory Visit (INDEPENDENT_AMBULATORY_CARE_PROVIDER_SITE_OTHER): Payer: Managed Care, Other (non HMO) | Admitting: General Surgery

## 2012-04-29 VITALS — BP 132/86 | HR 71 | Temp 98.9°F | Resp 16 | Ht 65.75 in | Wt 258.4 lb

## 2012-04-29 DIAGNOSIS — K432 Incisional hernia without obstruction or gangrene: Secondary | ICD-10-CM

## 2012-04-29 NOTE — Telephone Encounter (Signed)
Pre-Op cardiac clearance received from Dr. Harvie Bridge office.  Order's given to Surgery Schedulers.

## 2012-04-29 NOTE — Progress Notes (Signed)
Chief complaint: Followup incisional hernia  History: Patient returns for followup of her incisional hernia at her previous epigastric laparoscopic port site. Please see my previous dictation for details. She continues to have intermittent pain at the site particularly with motion and a lump that she has to push back in. No symptoms of bowel obstruction. She has seen Dr. Russella Dar in followup for her GERD and having some dysphagia and was noted to have heme positive stools as well. She has a EGD and colonoscopy scheduled in the next few weeks. She has also had a neurologic workup and describes what sounds like an anatomic variation that may be causing her headaches but no major problems found. She has received cardiac clearance from Dr. Elease Hashimoto.  Past Medical History  Diagnosis Date  . Hypertension   . Chest pain, non-cardiac   . Obesity   . Cigarette smoker   . IBS (irritable bowel syndrome)   . GERD (gastroesophageal reflux disease)   . Diverticulosis   . Pyloric erosion   . Allergic rhinitis   . Chronic headaches   . Sleep apnea    Past Surgical History  Procedure Date  . Cardiac catheterization 12/28/2007    EF 75%. IT REVEALS NORMAL/SUPRANORMAL LEFT VENTRICULAR SYSTOLIC FUNCTION  . Cesarean section 2003, 2009    x 2  . Cholecystectomy 1993  . Laparoscopy   . Umbilical hernia repair 2004  . US echocardiography 05/08/2008    EF 55-60%  . Cardiovascular stress test 03/25/2007    EF 78%  . Endometrial ablation 09/2010    and D&C   Current Outpatient Prescriptions  Medication Sig Dispense Refill  . amLODipine (NORVASC) 5 MG tablet Take 1 tablet (5 mg total) by mouth daily.  90 tablet  1  . bacitracin-polymyxin b (POLYSPORIN) ophthalmic ointment       . HYDROcodone-acetaminophen (NORCO) 5-325 MG per tablet       . metoprolol succinate (TOPROL-XL) 50 MG 24 hr tablet Take 1 tablet (50 mg total) by mouth daily.  90 tablet  1  . omeprazole (PRILOSEC) 20 MG capsule Take one tablet by mouth  before dinner  30 capsule  0  . topiramate (TOPAMAX) 50 MG tablet       . cephALEXin (KEFLEX) 500 MG capsule       . ciprofloxacin (CILOXAN) 0.3 % ophthalmic solution       . fluconazole (DIFLUCAN) 150 MG tablet       . MOVIPREP 100 G SOLR Take 1 kit (100 g total) by mouth once.  1 kit  0   Allergies  Allergen Reactions  . Percocet (Oxycodone-Acetaminophen)   PE BP 132/86  Pulse 71  Temp(Src) 98.9 F (37.2 C) (Temporal)  Resp 16  Ht 5' 5.75" (1.67 m)  Wt 258 lb 6.4 oz (117.209 kg)  BMI 42.02 kg/m2  LMP 04/09/2012 General: A morbidly obese Afro-American female in no distress  Skin: Warm and dry no rash or infection  HEENT: No palpable mass or thyromegaly. Sclera nonicteric. Oropharynx clear.  Lungs: Breath sounds clear without increased work of breathing or wheezing  Cardiovascular: Regular rate and rhythm without murmur. No edema.  Abdomen: Central obesity. The patient to incident maneuver there is a fairly large upper midline diastases. This is not apparent understanding. There is however a discrete hernia mass about 5 cm in diameter by 5 cm above her umbilicus at a previous laparoscopic port incision. I suspect the actual defect was fairly small. It is tender and partially reducible.  No other masses organomegaly and no hernias in her lower abdomen palpable.  Extremities: No deformity or edema  Neuro: Alert and fully oriented. Gait normal.  Assessment and plan: Symptomatic ventral incisional hernia through an old supraumbilical laparoscopic port site. The actual discrete defect feels to be fairly small. She has a large diastasis which we again discussed I would not repair. We will plan to repair the discrete defect laparoscopically. We discussed the procedure in detail including indications, risks of anesthetic complications, bleeding, infection, intestinal injury, possibly for open surgery, and recurrence. She understands she has increased risk factors due to the obesity and cigarette  use. She desires to proceed with surgery we'll get this scheduled for her after she completes her EGD and colonoscopy.

## 2012-04-29 NOTE — Telephone Encounter (Signed)
Copy of Pre-Op Clearance in Triage.

## 2012-05-16 ENCOUNTER — Telehealth: Payer: Self-pay | Admitting: Gastroenterology

## 2012-05-17 ENCOUNTER — Encounter: Payer: Self-pay | Admitting: Gastroenterology

## 2012-05-17 ENCOUNTER — Ambulatory Visit (AMBULATORY_SURGERY_CENTER): Payer: Managed Care, Other (non HMO) | Admitting: Gastroenterology

## 2012-05-17 ENCOUNTER — Telehealth: Payer: Self-pay | Admitting: *Deleted

## 2012-05-17 VITALS — HR 72 | Temp 98.0°F | Resp 20 | Ht 65.0 in | Wt 251.0 lb

## 2012-05-17 DIAGNOSIS — F458 Other somatoform disorders: Secondary | ICD-10-CM

## 2012-05-17 DIAGNOSIS — D126 Benign neoplasm of colon, unspecified: Secondary | ICD-10-CM

## 2012-05-17 DIAGNOSIS — T8859XA Other complications of anesthesia, initial encounter: Secondary | ICD-10-CM

## 2012-05-17 DIAGNOSIS — R195 Other fecal abnormalities: Secondary | ICD-10-CM

## 2012-05-17 DIAGNOSIS — K589 Irritable bowel syndrome without diarrhea: Secondary | ICD-10-CM

## 2012-05-17 DIAGNOSIS — R0989 Other specified symptoms and signs involving the circulatory and respiratory systems: Secondary | ICD-10-CM

## 2012-05-17 HISTORY — DX: Other complications of anesthesia, initial encounter: T88.59XA

## 2012-05-17 MED ORDER — ALBUTEROL (5 MG/ML) CONTINUOUS INHALATION SOLN: 2.5000 mg | INHALATION_SOLUTION | Freq: Once | RESPIRATORY_TRACT | Status: DC

## 2012-05-17 MED ORDER — SODIUM CHLORIDE 0.9 % IV SOLN: 500.0000 mL | INTRAVENOUS | Status: DC

## 2012-05-17 NOTE — Patient Instructions (Signed)
HIGH FIBER DIET WITH LIBERAL FLUID INTAKE. CONTINUE YOUR OMEPRAZOLE. ANTIREFLUX MEASURES.    YOU HAD AN ENDOSCOPIC PROCEDURE TODAY AT THE [facility]: Refer to the procedure report that was given to you for any specific questions about what was found during the examination.  If the procedure report does not answer your questions, please call your gastroenterologist to clarify.  If you requested that your care partner not be given the details of your procedure findings, then the procedure report has been included in a sealed envelope for you to review at your convenience later.  YOU SHOULD EXPECT: Some feelings of bloating in the abdomen. Passage of more gas than usual.  Walking can help get rid of the air that was put into your GI tract during the procedure and reduce the bloating. If you had a lower endoscopy (such as a colonoscopy or flexible sigmoidoscopy) you may notice spotting of blood in your stool or on the toilet paper. If you underwent a bowel prep for your procedure, then you may not have a normal bowel movement for a few days.  DIET: Your first meal following the procedure should be a light meal and then it is ok to progress to your normal diet.  A half-sandwich or bowl of soup is an example of a good first meal.  Heavy or fried foods are harder to digest and may make you feel nauseous or bloated.  Likewise meals heavy in dairy and vegetables can cause extra gas to form and this can also increase the bloating.  Drink plenty of fluids but you should avoid alcoholic beverages for 24 hours.  ACTIVITY: Your care partner should take you home directly after the procedure.  You should plan to take it easy, moving slowly for the rest of the day.  You can resume normal activity the day after the procedure however you should NOT DRIVE or use heavy machinery for 24 hours (because of the sedation medicines used during the test).    SYMPTOMS TO REPORT IMMEDIATELY: A gastroenterologist can  be reached at any hour.  During normal business hours, 8:30 AM to 5:00 PM Monday through Friday, call [phone].  After hours and on weekends, please call the GI answering service at [phone] who will take a message and have the physician on call contact you.   Following lower endoscopy (colonoscopy or flexible sigmoidoscopy):  Excessive amounts of blood in the stool  Significant tenderness or worsening of abdominal pains  Swelling of the abdomen that is new, acute  Fever of 100F or higher  Following upper endoscopy (EGD)  Vomiting of blood or coffee ground material  New chest pain or pain under the shoulder blades  Painful or persistently difficult swallowing  New shortness of breath  Fever of 100F or higher  Black, tarry-looking stools  FOLLOW UP: If any biopsies were taken you will be contacted by phone or by letter within the next 1-3 weeks.  Call your gastroenterologist if you have not heard about the biopsies in 3 weeks.  Our staff will call the home number listed on your records the next business day following your procedure to check on you and address any questions or concerns that you may have at that time regarding the information given to you following your procedure. This is a courtesy call and so if there is no answer at the home number and we have not heard from you through the emergency physician on call, we will assume that you  have returned to your regular daily activities without incident.  SIGNATURES/CONFIDENTIALITY: You and/or your care partner have signed paperwork which will be entered into your electronic medical record.  These signatures attest to the fact that that the information above on your After Visit Summary has been reviewed and is understood.  Full responsibility of the confidentiality of this discharge information lies with you and/or your care-partner.  

## 2012-05-17 NOTE — Op Note (Signed)
[facility] [address] [place], [place]  [address]  ENDOSCOPY PROCEDURE REPORT  PATIENT:  Lisa [name], Laser  MR#:  [id number] BIRTHDATE:  19-Feb-1961, 50 yrs. old  GENDER:  female ENDOSCOPIST:  Judie Petit T. Russella Dar, MD, [facility] Referred by:  Derrek Gu, M.D. PROCEDURE DATE:  05/17/2012 PROCEDURE:  EGD, diagnostic 43235 ASA CLASS:  Class III INDICATIONS:  globus, hemoccult positive stool MEDICATIONS:  MAC sedation, administered by CRNA, There was residual sedation effect present from prior procedure, propofol (Diprivan) 100 mgIV, atroptine 1 mg IV TOPICAL ANESTHETIC:   none DESCRIPTION OF PROCEDURE:   After the risks benefits and alternatives of the procedure were thoroughly explained, informed consent was obtained.  The [facility] [id number] endoscope was introduced through the mouth and advanced to the second portion of the duodenum, without limitations.  The instrument was slowly withdrawn as the mucosa was fully examined. <<PROCEDUREIMAGES>> The esophagus and gastroesophageal junction were completely normal in appearance.  The stomach was entered and closely examined. The pylorus, antrum, angularis, and lesser curvature were well visualized, including a retroflexed view of the cardia and fundus. The stomach wall was normally distensable. The scope passed easily through the pylorus into the duodenum.  The duodenal bulb was normal in appearance, as was the postbulbar duodenum.  Retroflexed views revealed no abnormalities.    The scope was then withdrawn from the patient and the procedure completed.  COMPLICATIONS:  None  ENDOSCOPIC IMPRESSION: 1) Normal EGD  RECOMMENDATIONS: 1) Anti-reflux regimen 2) Continue PPI  Teigen Parslow T. Russella Dar, MD, [name]  n. eSIGNED:   Venita Lick. Sable Knoles at 05/17/2012 02:41 PM  Bascom Levels, [phone]

## 2012-05-17 NOTE — Progress Notes (Signed)
Patient did not experience any of the following events: a burn prior to discharge; a fall within the facility; wrong site/side/patient/procedure/implant event; or a hospital transfer or hospital admission upon discharge from the facility. (G8907) Patient did not have preoperative order for IV antibiotic SSI prophylaxis. (G8918)  

## 2012-05-17 NOTE — Telephone Encounter (Signed)
Called patient and she states that she was feeling shaky yesterday evening but it has subsided.  She feels it was coming from not eating.  She also was concerned about [name] knowing that she has sleep apnea and I advised her that it was documented already in her chart.  She asked what do they do differently and I advised her that she will be monitored closely during the procedure and the CRNA is aware of her sleep apnea.  Advised her to call us back if she has anymore concerns.

## 2012-05-17 NOTE — Progress Notes (Addendum)
ON ARRIVAL TO RECOVERY PATIENT SITTING AT 45 DEGREES. 02 2L/MIN., SA02 79-88%. PATIENT COMPLAINS OF WHEEZING AND INABILITY TO BREATH. PATIENT ABLE TO COUGH, AUDIBLE WHEEZING.DR. Russella Dar AT BEDSIDE. NEBULIZER TREATMENT WITH ALBUTEROL ORDERED. PATIENT CONTINUE TO COMPLAIN OF DYSPNEA, CHEST PAIN AND NOW  FRONTAL HEADACHE. 1458 -1508 NEBULIZER TREATMENT WITH ALBUTEROL GIVEN.PATIENT VISIBLY IMPROVING WITH 02 SATURATIONS 87-93%. BASELINE ROOM AIR AT 93%.  1508 PATIENT BELCHING STATING HER CHEST DISCOMFORT IMPROVED, PATIENT NO LONGER ANXIOUS. 1512DR STARK IN TO SEE THE PATIENT. 1517DR. STARK IN TO SEE PATIENT.SA02 99-100% DENIES CHEST PAIN. STILL FRONTAL HEADACHE PATIENT STATING RELATED TO HER HUNGER. 1527 02 DC. HUSBAND AT [place],4540.PATIENT STATING SHE HAS A LOW PAIN TOLERANCE. HUSBAND CONCURS. 1547PATIENT WITHOUT DYSPNEA, BELCHING. STATING SHE DOES NOT HAVE ANY PAIN. NO CHEST OR HEAD ACHE. ONCE 02 DISCONTINUED SA02 88-92%. PATIENT STATING AFTER EATING ONCE HOME SHE WILL SLEEP WITH HER CPAP. ENCOURAGED SMOKING CESSATION. PATIENT AND HUSBAND VERBALIZING AGREEMENT WITH NEED TO QUIT. NO PAIN,DYSPNEA  SAO2 AT 90 % ON DISCHARGE. PATIENT VERBALIZED UNDERSTANDING IF DYSPNEIC SHE SHOULD SEEK MEDICAL ASSISTANCE, PATIENT STATING SHE WILL CALL 911.

## 2012-05-17 NOTE — Op Note (Signed)
[facility] [address] [place], [place]  [address]  COLONOSCOPY PROCEDURE REPORT  PATIENT:  Lisa, [name]  MR#:  [id number] BIRTHDATE:  [birth year], 50 yrs. old  GENDER:  female ENDOSCOPIST:  Judie Petit T. Russella Dar, MD, [facility] Referred by:  Derrek Gu, M.D. PROCEDURE DATE:  05/17/2012 PROCEDURE:  Colonoscopy with snare polypectomy ASA CLASS:  Class III INDICATIONS:  1) heme positive stool  2) change in bowel habits MEDICATIONS:   MAC sedation, administered by CRNA, propofol (Diprivan) 200 mg IV DESCRIPTION OF PROCEDURE:   After the risks benefits and alternatives of the procedure were thoroughly explained, informed consent was obtained.  Digital rectal exam was performed and revealed no abnormalities.   The LB [id number] endoscope was introduced through the anus and advanced to the cecum, which was identified by both the appendix and ileocecal valve, without limitations.  The quality of the prep was good, using MoviPrep. The instrument was then slowly withdrawn as the colon was fully examined. <<PROCEDUREIMAGES>> FINDINGS:  Two polyps were found in the sigmoid colon. They were 6 mm in size. Polyps were snared without cautery. Retrieval was successful.   Moderate diverticulosis was found in the sigmoid to descending colon.  Otherwise normal colonoscopy without other polyps, masses, vascular ectasias, or inflammatory changes. Retroflexed views in the rectum revealed internal hemorrhoids, small.  The time to cecum =  2.67  minutes. The scope was then withdrawn (time =  12  min) from the patient and the procedure completed.  COMPLICATIONS:  None  ENDOSCOPIC IMPRESSION: 1) 6 mm Two polyps in the sigmoid colon 2) Moderate diverticulosis in the sigmoid to descending colon 3) Internal hemorrhoids  RECOMMENDATIONS: 1) Await pathology results 2) High fiber diet with liberal fluid intake. 3) Repeat Colonoscopy in 5 years.  Venita Lick. Russella Dar, MD, [name]  n. eSIGNED:    Venita Lick. Adeli Frost at 05/17/2012 02:32 PM  Bascom Levels, [id number]

## 2012-05-18 ENCOUNTER — Telehealth: Payer: Self-pay

## 2012-05-18 NOTE — Telephone Encounter (Signed)
Pt called.  Expelling gas and eating without incident.  Does have sore neck and tongue (probably from airway management by Brennan Bailey CRNA following procedure yesterday).  Advised to take Tylenol or Ibuprofen unless contraindicated for any other reason.  Pt agreed.  Has another sleep study scheduled for 05-27-12.

## 2012-05-18 NOTE — Telephone Encounter (Signed)
Left message

## 2012-05-23 ENCOUNTER — Encounter: Payer: Self-pay | Admitting: Gastroenterology

## 2012-05-31 ENCOUNTER — Encounter (HOSPITAL_COMMUNITY): Payer: Self-pay | Admitting: Pharmacy Technician

## 2012-06-03 ENCOUNTER — Other Ambulatory Visit: Payer: Self-pay | Admitting: Urology

## 2012-06-03 DIAGNOSIS — R3129 Other microscopic hematuria: Secondary | ICD-10-CM

## 2012-06-03 DIAGNOSIS — K769 Liver disease, unspecified: Secondary | ICD-10-CM

## 2012-06-08 ENCOUNTER — Encounter (HOSPITAL_COMMUNITY)
Admission: RE | Admit: 2012-06-08 | Discharge: 2012-06-08 | Disposition: A | Discharge status: Home / Self Care | Payer: Managed Care, Other (non HMO) | Source: Ambulatory Visit | Attending: General Surgery | Admitting: General Surgery

## 2012-06-08 ENCOUNTER — Ambulatory Visit (HOSPITAL_COMMUNITY)
Admission: RE | Admit: 2012-06-08 | Discharge: 2012-06-08 | Disposition: A | Discharge status: Home / Self Care | Payer: Managed Care, Other (non HMO) | Source: Ambulatory Visit | Attending: General Surgery | Admitting: General Surgery

## 2012-06-08 ENCOUNTER — Encounter (HOSPITAL_COMMUNITY): Payer: Self-pay

## 2012-06-08 DIAGNOSIS — K439 Ventral hernia without obstruction or gangrene: Secondary | ICD-10-CM | POA: Insufficient documentation

## 2012-06-08 DIAGNOSIS — Z01812 Encounter for preprocedural laboratory examination: Secondary | ICD-10-CM | POA: Insufficient documentation

## 2012-06-08 DIAGNOSIS — I517 Cardiomegaly: Secondary | ICD-10-CM | POA: Insufficient documentation

## 2012-06-08 HISTORY — DX: Unspecified lump in unspecified breast: N63.0

## 2012-06-08 HISTORY — DX: Adverse effect of unspecified anesthetic, initial encounter: T41.45XA

## 2012-06-08 LAB — CBC
HCT: 42.1 % (ref 36.0–46.0)
Hemoglobin: 14.4 g/dL (ref 12.0–15.0)
MCV: 89.4 fL (ref 78.0–100.0)
RBC: 4.71 MIL/uL (ref 3.87–5.11)
RDW: 13.9 % (ref 11.5–15.5)
WBC: 8.1 10*3/uL (ref 4.0–10.5)

## 2012-06-08 LAB — BASIC METABOLIC PANEL
CO2: 25 mEq/L (ref 19–32)
Calcium: 9.7 mg/dL (ref 8.4–10.5)
Chloride: 104 mEq/L (ref 96–112)
Glucose, Bld: 102 mg/dL — ABNORMAL HIGH (ref 70–99)
Potassium: 4 mEq/L (ref 3.5–5.1)
Sodium: 138 mEq/L (ref 135–145)

## 2012-06-08 LAB — SURGICAL PCR SCREEN: Staphylococcus aureus: NEGATIVE

## 2012-06-08 LAB — HCG, SERUM, QUALITATIVE: Preg, Serum: NEGATIVE

## 2012-06-08 MED ORDER — CEFAZOLIN SODIUM 1-5 GM-% IV SOLN: 1.0000 g | INTRAVENOUS | Status: DC

## 2012-06-08 NOTE — Progress Notes (Addendum)
lov note dr Melburn Popper epic 10-12-2011 ekg 11-1-2 2-12 epic [facility] 6-01-01-2012 on chart

## 2012-06-08 NOTE — Patient Instructions (Addendum)
20 [name]  06/08/2012  Your procedure is scheduled on:  06-15-2012  Report to [facility] at  0930 AM.  Call this number if you have problems the morning of surgery: [phone]   Remember:bring cpap mask   Do not eat food or drink liquids:After Midnight.    Take these medicines the morning of surgery with A SIP OF WATER: omeprazole   Do not wear jewelry or make up.  Do not wear lotions, powders, or perfumes.Do not wear deodorant.    Do not bring valuables to the hospital.  Contacts, dentures or bridgework may not be worn into surgery.  Leave suitcase in the car. After surgery it may be brought to your room.  For patients admitted to the hospital, checkout time is 11:00 AM the day of   discharge.     Special Instructions: CHG Shower Use Special Wash: 1/2 bottle night before surgery and 1/2 bottle morning of surgery, use regular soap on face and front and back private area., no shaving for 2 days before showers   Please read over the following fact sheets that you were given: MRSA Information  Lisa [name] [facility] pre op nurse phone number [phone], call if needed

## 2012-06-10 ENCOUNTER — Ambulatory Visit
Admission: RE | Admit: 2012-06-10 | Discharge: 2012-06-10 | Disposition: A | Discharge status: Home / Self Care | Payer: Managed Care, Other (non HMO) | Source: Ambulatory Visit | Attending: Urology | Admitting: Urology

## 2012-06-10 DIAGNOSIS — K769 Liver disease, unspecified: Secondary | ICD-10-CM

## 2012-06-10 DIAGNOSIS — R3129 Other microscopic hematuria: Secondary | ICD-10-CM

## 2012-06-10 MED ORDER — GADOBENATE DIMEGLUMINE 529 MG/ML IV SOLN
20.0000 mL | Freq: Once | INTRAVENOUS | Status: AC | PRN
  Administered 2012-06-10: 20 mL via INTRAVENOUS

## 2012-06-13 NOTE — Pre-Procedure Instructions (Signed)
Requested 06-03-2012 sleep study [facility], results not read by dr dohmier yet, will fax to [facility] pre op when sleep study results available.

## 2012-06-15 ENCOUNTER — Ambulatory Visit (HOSPITAL_COMMUNITY)
Admission: RE | Admit: 2012-06-15 | Discharge: 2012-06-16 | Disposition: A | Discharge status: Home / Self Care | Payer: Managed Care, Other (non HMO) | Source: Ambulatory Visit | Attending: General Surgery | Admitting: General Surgery

## 2012-06-15 ENCOUNTER — Ambulatory Visit (HOSPITAL_COMMUNITY): Payer: Managed Care, Other (non HMO) | Admitting: Anesthesiology

## 2012-06-15 ENCOUNTER — Encounter (HOSPITAL_COMMUNITY)
Admission: RE | Disposition: A | Discharge status: Home / Self Care | Payer: Self-pay | Source: Ambulatory Visit | Attending: General Surgery

## 2012-06-15 ENCOUNTER — Encounter (HOSPITAL_COMMUNITY): Payer: Self-pay | Admitting: Anesthesiology

## 2012-06-15 ENCOUNTER — Encounter (HOSPITAL_COMMUNITY): Payer: Self-pay | Admitting: *Deleted

## 2012-06-15 DIAGNOSIS — K439 Ventral hernia without obstruction or gangrene: Secondary | ICD-10-CM | POA: Insufficient documentation

## 2012-06-15 DIAGNOSIS — I10 Essential (primary) hypertension: Secondary | ICD-10-CM | POA: Insufficient documentation

## 2012-06-15 DIAGNOSIS — R0989 Other specified symptoms and signs involving the circulatory and respiratory systems: Secondary | ICD-10-CM

## 2012-06-15 DIAGNOSIS — K432 Incisional hernia without obstruction or gangrene: Secondary | ICD-10-CM

## 2012-06-15 DIAGNOSIS — K219 Gastro-esophageal reflux disease without esophagitis: Secondary | ICD-10-CM | POA: Insufficient documentation

## 2012-06-15 DIAGNOSIS — Z79899 Other long term (current) drug therapy: Secondary | ICD-10-CM | POA: Insufficient documentation

## 2012-06-15 DIAGNOSIS — G4730 Sleep apnea, unspecified: Secondary | ICD-10-CM | POA: Insufficient documentation

## 2012-06-15 DIAGNOSIS — Z23 Encounter for immunization: Secondary | ICD-10-CM | POA: Insufficient documentation

## 2012-06-15 HISTORY — PX: VENTRAL HERNIA REPAIR: SHX424

## 2012-06-15 SURGERY — REPAIR, HERNIA, VENTRAL, LAPAROSCOPIC
Anesthesia: General | Site: Abdomen | Wound class: Clean

## 2012-06-15 MED ORDER — BUPIVACAINE LIPOSOME 1.3 % IJ SUSP
20.0000 mL | Freq: Once | INTRAMUSCULAR | Status: DC
  Filled 2012-06-15: qty 20

## 2012-06-15 MED ORDER — PANTOPRAZOLE SODIUM 40 MG PO TBEC
40.0000 mg | DELAYED_RELEASE_TABLET | Freq: Every day | ORAL | Status: DC
  Administered 2012-06-16: 40 mg via ORAL
  Filled 2012-06-15: qty 1

## 2012-06-15 MED ORDER — ONDANSETRON HCL 4 MG/2ML IJ SOLN
INTRAMUSCULAR | Status: DC | PRN
  Administered 2012-06-15: 2 mg via INTRAVENOUS

## 2012-06-15 MED ORDER — HYDROMORPHONE HCL PF 1 MG/ML IJ SOLN
0.2500 mg | INTRAMUSCULAR | Status: DC | PRN
  Administered 2012-06-15 (×2): 0.5 mg via INTRAVENOUS

## 2012-06-15 MED ORDER — GLYCOPYRROLATE 0.2 MG/ML IJ SOLN
INTRAMUSCULAR | Status: DC | PRN
  Administered 2012-06-15 (×2): 0.2 mg via INTRAVENOUS
  Administered 2012-06-15: 0.4 mg via INTRAVENOUS

## 2012-06-15 MED ORDER — KETAMINE HCL 10 MG/ML IJ SOLN
INTRAMUSCULAR | Status: DC | PRN
  Administered 2012-06-15: 10 mg via INTRAVENOUS

## 2012-06-15 MED ORDER — HEPARIN SODIUM (PORCINE) 5000 UNIT/ML IJ SOLN
5000.0000 [IU] | Freq: Once | INTRAMUSCULAR | Status: AC
  Administered 2012-06-15: 5000 [IU] via SUBCUTANEOUS

## 2012-06-15 MED ORDER — LACTATED RINGERS IV SOLN
INTRAVENOUS | Status: DC | PRN
  Administered 2012-06-15 (×2): via INTRAVENOUS

## 2012-06-15 MED ORDER — CEFAZOLIN SODIUM-DEXTROSE 2-3 GM-% IV SOLR
2.0000 g | Freq: Once | INTRAVENOUS | Status: AC
  Administered 2012-06-15: 2 g via INTRAVENOUS

## 2012-06-15 MED ORDER — HYDROMORPHONE HCL PF 1 MG/ML IJ SOLN
INTRAMUSCULAR | Status: AC
  Filled 2012-06-15: qty 1

## 2012-06-15 MED ORDER — PROPOFOL 10 MG/ML IV EMUL
INTRAVENOUS | Status: DC | PRN
  Administered 2012-06-15: 300 mg via INTRAVENOUS

## 2012-06-15 MED ORDER — ONDANSETRON HCL 4 MG PO TABS: 4.0000 mg | ORAL_TABLET | Freq: Four times a day (QID) | ORAL | Status: DC | PRN

## 2012-06-15 MED ORDER — BUPIVACAINE LIPOSOME 1.3 % IJ SUSP
INTRAMUSCULAR | Status: DC | PRN
  Administered 2012-06-15: 20 mL

## 2012-06-15 MED ORDER — SODIUM CHLORIDE 0.9 % IJ SOLN
INTRAMUSCULAR | Status: DC | PRN
  Administered 2012-06-15: 20 mL via INTRAVENOUS

## 2012-06-15 MED ORDER — LACTATED RINGERS IV SOLN
INTRAVENOUS | Status: DC
  Administered 2012-06-15: 1000 mL via INTRAVENOUS

## 2012-06-15 MED ORDER — ACETAMINOPHEN 10 MG/ML IV SOLN
1000.0000 mg | Freq: Four times a day (QID) | INTRAVENOUS | Status: DC
  Administered 2012-06-15 – 2012-06-16 (×3): 1000 mg via INTRAVENOUS
  Filled 2012-06-15 (×4): qty 100

## 2012-06-15 MED ORDER — CEFAZOLIN SODIUM-DEXTROSE 2-3 GM-% IV SOLR
INTRAVENOUS | Status: AC
  Filled 2012-06-15: qty 50

## 2012-06-15 MED ORDER — MIDAZOLAM HCL 5 MG/5ML IJ SOLN
INTRAMUSCULAR | Status: DC | PRN
  Administered 2012-06-15 (×2): 1 mg via INTRAVENOUS

## 2012-06-15 MED ORDER — MORPHINE SULFATE 2 MG/ML IJ SOLN
2.0000 mg | INTRAMUSCULAR | Status: DC | PRN
  Administered 2012-06-15 – 2012-06-16 (×2): 2 mg via INTRAVENOUS
  Filled 2012-06-15 (×2): qty 1

## 2012-06-15 MED ORDER — METOPROLOL SUCCINATE ER 50 MG PO TB24
50.0000 mg | ORAL_TABLET | Freq: Every day | ORAL | Status: DC
Start: 2012-06-15 — End: 2012-06-16
  Administered 2012-06-15: 50 mg via ORAL
  Filled 2012-06-15 (×2): qty 1

## 2012-06-15 MED ORDER — ALBUTEROL (5 MG/ML) CONTINUOUS INHALATION SOLN
2.5000 mg | INHALATION_SOLUTION | Freq: Once | RESPIRATORY_TRACT | Status: DC
  Filled 2012-06-15: qty 20

## 2012-06-15 MED ORDER — NEOSTIGMINE METHYLSULFATE 1 MG/ML IJ SOLN
INTRAMUSCULAR | Status: DC | PRN
  Administered 2012-06-15: 3 mg via INTRAVENOUS

## 2012-06-15 MED ORDER — ONDANSETRON HCL 4 MG/2ML IJ SOLN
4.0000 mg | Freq: Four times a day (QID) | INTRAMUSCULAR | Status: DC | PRN
  Administered 2012-06-15: 4 mg via INTRAVENOUS
  Filled 2012-06-15: qty 2

## 2012-06-15 MED ORDER — KCL IN DEXTROSE-NACL 20-5-0.9 MEQ/L-%-% IV SOLN
INTRAVENOUS | Status: DC
  Administered 2012-06-15: 18:00:00 via INTRAVENOUS
  Filled 2012-06-15 (×3): qty 1000

## 2012-06-15 MED ORDER — CISATRACURIUM BESYLATE (PF) 10 MG/5ML IV SOLN
INTRAVENOUS | Status: DC | PRN
  Administered 2012-06-15: 10 mg via INTRAVENOUS
  Administered 2012-06-15: 2 mg via INTRAVENOUS

## 2012-06-15 MED ORDER — ACETAMINOPHEN 10 MG/ML IV SOLN
INTRAVENOUS | Status: AC
  Filled 2012-06-15: qty 100

## 2012-06-15 MED ORDER — SUCCINYLCHOLINE CHLORIDE 20 MG/ML IJ SOLN
INTRAMUSCULAR | Status: DC | PRN
  Administered 2012-06-15: 160 mg via INTRAVENOUS

## 2012-06-15 MED ORDER — LACTATED RINGERS IV SOLN: INTRAVENOUS | Status: DC

## 2012-06-15 MED ORDER — LIDOCAINE HCL (CARDIAC) 20 MG/ML IV SOLN
INTRAVENOUS | Status: DC | PRN
  Administered 2012-06-15: 30 mg via INTRAVENOUS

## 2012-06-15 MED ORDER — DEXAMETHASONE SODIUM PHOSPHATE 4 MG/ML IJ SOLN
INTRAMUSCULAR | Status: DC | PRN
  Administered 2012-06-15: 10 mg via INTRAVENOUS

## 2012-06-15 MED ORDER — MENTHOL 3 MG MT LOZG
1.0000 | LOZENGE | OROMUCOSAL | Status: DC | PRN
  Filled 2012-06-15: qty 9

## 2012-06-15 MED ORDER — FENTANYL CITRATE 0.05 MG/ML IJ SOLN
INTRAMUSCULAR | Status: DC | PRN
  Administered 2012-06-15 (×4): 50 ug via INTRAVENOUS
  Administered 2012-06-15: 100 ug via INTRAVENOUS
  Administered 2012-06-15: 50 ug via INTRAVENOUS

## 2012-06-15 MED ORDER — HEPARIN SODIUM (PORCINE) 5000 UNIT/ML IJ SOLN
5000.0000 [IU] | Freq: Three times a day (TID) | INTRAMUSCULAR | Status: DC
  Administered 2012-06-15 – 2012-06-16 (×2): 5000 [IU] via SUBCUTANEOUS
  Filled 2012-06-15 (×5): qty 1

## 2012-06-15 MED ORDER — AMLODIPINE BESYLATE 5 MG PO TABS
5.0000 mg | ORAL_TABLET | Freq: Every day | ORAL | Status: DC
  Administered 2012-06-15: 5 mg via ORAL
  Filled 2012-06-15 (×2): qty 1

## 2012-06-15 MED ORDER — HYDROCODONE-ACETAMINOPHEN 5-325 MG PO TABS
1.0000 | ORAL_TABLET | ORAL | Status: DC | PRN
  Administered 2012-06-16: 1 via ORAL
  Filled 2012-06-15: qty 2

## 2012-06-15 MED ORDER — MIDAZOLAM HCL 2 MG/2ML IJ SOLN
INTRAMUSCULAR | Status: AC
  Filled 2012-06-15: qty 2

## 2012-06-15 MED ORDER — ACETAMINOPHEN 10 MG/ML IV SOLN
INTRAVENOUS | Status: DC | PRN
  Administered 2012-06-15: 1000 mg via INTRAVENOUS

## 2012-06-15 MED ORDER — BUPIVACAINE-EPINEPHRINE 0.25% -1:200000 IJ SOLN
INTRAMUSCULAR | Status: DC | PRN
  Administered 2012-06-15: 15 mL

## 2012-06-15 SURGICAL SUPPLY — 57 items
ADH SKN CLS APL DERMABOND .7 (GAUZE/BANDAGES/DRESSINGS) ×2
APL SKNCLS STERI-STRIP NONHPOA (GAUZE/BANDAGES/DRESSINGS)
APPLIER CLIP 5 13 M/L LIGAMAX5 (MISCELLANEOUS)
APR CLP MED LRG 5 ANG JAW (MISCELLANEOUS)
BENZOIN TINCTURE PRP APPL 2/3 (GAUZE/BANDAGES/DRESSINGS) IMPLANT
BINDER ABD UNIV 12 45-62 (WOUND CARE) IMPLANT
BINDER ABDOMINAL 46IN 62IN (WOUND CARE) ×2
CANISTER SUCTION 2500CC (MISCELLANEOUS) ×2 IMPLANT
CHLORAPREP W/TINT 26ML (MISCELLANEOUS) ×2 IMPLANT
CLIP APPLIE 5 13 M/L LIGAMAX5 (MISCELLANEOUS) IMPLANT
CLOTH BEACON ORANGE TIMEOUT ST (SAFETY) ×2 IMPLANT
COVER SURGICAL LIGHT HANDLE (MISCELLANEOUS) ×2 IMPLANT
DECANTER SPIKE VIAL GLASS SM (MISCELLANEOUS) ×2 IMPLANT
DERMABOND ADVANCED (GAUZE/BANDAGES/DRESSINGS) ×2
DERMABOND ADVANCED .7 DNX12 (GAUZE/BANDAGES/DRESSINGS) IMPLANT
DEVICE SECURE STRAP 25 ABSORB (INSTRUMENTS) IMPLANT
DEVICE TROCAR PUNCTURE CLOSURE (ENDOMECHANICALS) IMPLANT
DISSECTOR BLUNT TIP ENDO 5MM (MISCELLANEOUS) ×1 IMPLANT
DRAPE LAPAROSCOPIC ABDOMINAL (DRAPES) ×2 IMPLANT
DRAPE UTILITY XL STRL (DRAPES) ×2 IMPLANT
ELECT REM PT RETURN 9FT ADLT (ELECTROSURGICAL) ×2
ELECTRODE REM PT RTRN 9FT ADLT (ELECTROSURGICAL) ×1 IMPLANT
GLOVE BIOGEL PI IND STRL 7.0 (GLOVE) ×1 IMPLANT
GLOVE BIOGEL PI INDICATOR 7.0 (GLOVE) ×1
GLOVE SURG SS PI 7.5 STRL IVOR (GLOVE) ×2 IMPLANT
GOWN STRL NON-REIN LRG LVL3 (GOWN DISPOSABLE) ×2 IMPLANT
GOWN STRL REIN XL XLG (GOWN DISPOSABLE) ×4 IMPLANT
KIT BASIN OR (CUSTOM PROCEDURE TRAY) ×2 IMPLANT
MESH VENTRALIGHT ST 4X6IN (Mesh General) ×1 IMPLANT
NDL SPNL 22GX3.5 QUINCKE BK (NEEDLE) ×1 IMPLANT
NEEDLE SPNL 22GX3.5 QUINCKE BK (NEEDLE) ×2 IMPLANT
NS IRRIG 1000ML POUR BTL (IV SOLUTION) ×2 IMPLANT
PEN SKIN MARKING BROAD (MISCELLANEOUS) ×2 IMPLANT
PENCIL BUTTON HOLSTER BLD 10FT (ELECTRODE) IMPLANT
SCALPEL HARMONIC ACE (MISCELLANEOUS) ×1 IMPLANT
SCISSORS LAP 5X35 DISP (ENDOMECHANICALS) IMPLANT
SET IRRIG TUBING LAPAROSCOPIC (IRRIGATION / IRRIGATOR) IMPLANT
SLEEVE ADV FIXATION 5X100MM (TROCAR) IMPLANT
SLEEVE Z-THREAD 5X100MM (TROCAR) IMPLANT
SOLUTION ANTI FOG 6CC (MISCELLANEOUS) ×2 IMPLANT
STRIP CLOSURE SKIN 1/2X4 (GAUZE/BANDAGES/DRESSINGS) IMPLANT
SUT MNCRL AB 4-0 PS2 18 (SUTURE) ×2 IMPLANT
SUT NOVA 0 T19/GS 22DT (SUTURE) ×2 IMPLANT
SUT NOVA NAB GS-21 0 18 T12 DT (SUTURE) IMPLANT
SUT PROLENE 0 CT 1 CR/8 (SUTURE) IMPLANT
TACKER 5MM HERNIA 3.5CML NAB (ENDOMECHANICALS) ×1 IMPLANT
TOWEL OR 17X26 10 PK STRL BLUE (TOWEL DISPOSABLE) ×2 IMPLANT
TRAY FOLEY CATH 14FRSI W/METER (CATHETERS) ×1 IMPLANT
TRAY LAP CHOLE (CUSTOM PROCEDURE TRAY) ×2 IMPLANT
TROCAR ADV FIXATION 11X100MM (TROCAR) IMPLANT
TROCAR ADV FIXATION 5X100MM (TROCAR) IMPLANT
TROCAR BLADELESS OPT 5 75 (ENDOMECHANICALS) ×1 IMPLANT
TROCAR XCEL NON-BLD 11X100MML (ENDOMECHANICALS) IMPLANT
TROCAR Z-THREAD FIOS 11X100 BL (TROCAR) ×1 IMPLANT
TROCAR Z-THREAD FIOS 5X100MM (TROCAR) ×2 IMPLANT
TROCAR Z-THREAD SLEEVE 11X100 (TROCAR) IMPLANT
TUBING INSUFFLATION 10FT LAP (TUBING) ×2 IMPLANT

## 2012-06-15 NOTE — Progress Notes (Signed)
RT placed PT on Auto BiPAP (Max IPAP 14cm / Min EPAP 8- with 2 lpm 02 bleed in). PT tolerating well at this time.

## 2012-06-15 NOTE — Progress Notes (Signed)
Correction to previous Progress Note (06/15/12 at 1722)- Min 6cm (not 8 cm)- and PT wanted to eat dinner at this time- PAP not in use- RN aware.

## 2012-06-15 NOTE — Transfer of Care (Signed)
Immediate Anesthesia Transfer of Care Note  Patient: Lisa [name]  Procedure(s) Performed: Procedure(s) (LRB): LAPAROSCOPIC VENTRAL HERNIA (N/A)  Patient Location: PACU  Anesthesia Type: General  Level of Consciousness: awake and alert   Airway & Oxygen Therapy: Patient Spontanous Breathing and Patient connected to face mask  Post-op Assessment: Report given to PACU RN and Post -op Vital signs reviewed and stable  Post vital signs: Reviewed and stable  Complications: No apparent anesthesia complications

## 2012-06-15 NOTE — Op Note (Signed)
Preoperative Diagnosis: ventral hernia  Postoprative Diagnosis: ventral hernia  Procedure: Procedure(s): LAPAROSCOPIC VENTRAL HERNIA   Surgeon: Glenna Fellows T   Assistants: None  Anesthesia:  General endotracheal anesthesiaDiagnos  Indications:   Patient is a 51 year old female with previous lower abdominal ventral hernia repair done laparoscopically and now presents with an increasingly symptomatic and enlarging ventral incisional hernia at the site of a supraumbilical Hassan port incision from her cholecystectomy several years ago. I recommend proceeding with laparoscopic repair. We discussed alternatives and risks detailed elsewhere and she is brought to the operating room for this procedure.  Procedure Detail:  Patient is brought to the operating room, placed in supine position on the operating table, andGen. Endotracheal anesthesia induced. Foley catheter was placed. The abdomen was widely sterilely prepped and draped. Patient timeout was performed and correct procedure verified. She received preoperative IV antibiotics and PAS were in place.access was obtained with a 5 mm Optiview trocar in the left upper quadrant without difficulty and pneumoperitoneum established. There was no evidence of trocar injury. Under direct vision a 10 mm trocar was placed in the left midabdomen and a 5 mm trocar in the left lower quadrant there were some omental adhesions up to the previously placed Gore-Tex mesh in the pelvis and to the midline above the umbilicus the site of the hernia. Using the harmonic scalpel and blunt dissection these were all taken down off the anterior abdominal wall. An incarcerated area of omentum was reduced from the hernia. I also took down the falciform ligament for several centimeters to allow placement of the mesh. A discrete hernia defect in the supraumbilical area was only about a centimeter in diameter although there was some slight laxity around the umbilicus just above  the previously placed pelvic mesh. I chose a 15 x 10 cm piece of Bard mesh with Seprafilm and 6 circumferential 0 Novafil sutures placed. Measuring intraperitoneally 6 corresponding small stab incisions were made in the anterior abdominal wall. The mesh was introduced into the abdomen and oriented and in the sutures brought up through the anterior abdominal wall and secured. There was very nice smooth broad deployment of the mesh. The mesh was then tacked around its periphery and also with an inner circle with the Securestrap tacker. Bed was inspected for hemostasis and trocar injury and everything looked fine. Trochars were withdrawn after evacuating all the CO2. I had injected the suture sites and trocar sites with Exparel local anesthetic. Skin incisions were closed with subcuticular Monocryl and Dermabond. Sponge needle and instrument counts were correct.  Findings: As above  Estimated Blood Loss:  Minimal         Drains: none  Blood Given: none          Specimens: none        Complications:  * No complications entered in OR log *         Disposition: PACU - hemodynamically stable.         Condition: stable  Mariella Saa MD, FACS  06/15/2012, 2:33 PM

## 2012-06-15 NOTE — Anesthesia Preprocedure Evaluation (Signed)
Anesthesia Evaluation  Patient identified by MRN, date of birth, ID band Patient awake    Reviewed: Allergy & Precautions, H&P , NPO status , Patient's Chart, lab work & pertinent test results, reviewed documented beta blocker date and time   Airway Mallampati: III TM Distance: >3 FB Neck ROM: Full    Dental No notable dental hx. (+) Teeth Intact and Dental Advisory Given   Pulmonary neg pulmonary ROS, sleep apnea and Continuous Positive Airway Pressure Ventilation , Current Smoker,  breath sounds clear to auscultation  Pulmonary exam normal       Cardiovascular Exercise Tolerance: Good hypertension, Pt. on home beta blockers negative cardio ROS  Rhythm:Regular Rate:Normal  Hx. Non-cardiac CP.  Had neg. Cardiac cath.   Neuro/Psych negative neurological ROS  negative psych ROS   GI/Hepatic negative GI ROS, Neg liver ROS, GERD-  Medicated and Controlled,  Endo/Other  negative endocrine ROSMorbid obesity  Renal/GU negative Renal ROS  negative genitourinary   Musculoskeletal   Abdominal   Peds  Hematology negative hematology ROS (+)   Anesthesia Other Findings   Reproductive/Obstetrics negative OB ROS                           Anesthesia Physical Anesthesia Plan  ASA: III  Anesthesia Plan: General   Post-op Pain Management:    Induction: Intravenous  Airway Management Planned: Oral ETT  Additional Equipment:   Intra-op Plan:   Post-operative Plan: Extubation in OR  Informed Consent: I have reviewed the patients History and Physical, chart, labs and discussed the procedure including the risks, benefits and alternatives for the proposed anesthesia with the patient or authorized representative who has indicated his/her understanding and acceptance.   Dental Advisory Given  Plan Discussed with: Surgeon  Anesthesia Plan Comments:         Anesthesia Quick Evaluation

## 2012-06-15 NOTE — Progress Notes (Signed)
Pt setup on cpap 11 cmH20 with 2L O2 at this time. Pt wears 10 cmH2O at home, but wanted to try a little bit more. Pt has her own mask/circuit. SpO2-97% at this time.  Jacqulynn Cadet RRT

## 2012-06-15 NOTE — Anesthesia Postprocedure Evaluation (Signed)
  Anesthesia Post-op Note  Patient: Lisa [name]  Procedure(s) Performed: Procedure(s) (LRB): LAPAROSCOPIC VENTRAL HERNIA (N/A)  Patient Location: PACU  Anesthesia Type: General  Level of Consciousness: awake and alert   Airway and Oxygen Therapy: Patient Spontanous Breathing  Post-op Pain: mild  Post-op Assessment: Post-op Vital signs reviewed, Patient's Cardiovascular Status Stable, Respiratory Function Stable, Patent Airway and No signs of Nausea or vomiting  Post-op Vital Signs: stable  Complications: No apparent anesthesia complications. CPAP and pulse oximetry up on floor.

## 2012-06-15 NOTE — H&P (Signed)
History: Patient presents of her incisional hernia at her previous epigastric laparoscopic port site. Please see my previous dictation for details. She continues to have intermittent pain at the site particularly with motion and a lump that she has to push back in. No symptoms of bowel obstruction. She has seen Dr. Russella Dar in followup for her GERD and having some dysphagia and was noted to have heme positive stools as well. She has a EGD and colonoscopy scheduled in the next few weeks. She has also had a neurologic workup and describes what sounds like an anatomic variation that may be causing her headaches but no major problems found. She has received cardiac clearance from Dr. Elease Hashimoto.  Past Medical History   Diagnosis  Date   .  Hypertension    .  Chest pain, non-cardiac    .  Obesity    .  Cigarette smoker    .  IBS (irritable bowel syndrome)    .  GERD (gastroesophageal reflux disease)    .  Diverticulosis    .  Pyloric erosion    .  Allergic rhinitis    .  Chronic headaches    .  Sleep apnea     Past Surgical History   Procedure  Date   .  Cardiac catheterization  12/28/2007     EF 75%. IT REVEALS NORMAL/SUPRANORMAL LEFT VENTRICULAR SYSTOLIC FUNCTION   .  Cesarean section  2003, 2009     x 2   .  Cholecystectomy  1993   .  Laparoscopy    .  Umbilical hernia repair  2004   .  US echocardiography  05/08/2008     EF 55-60%   .  Cardiovascular stress test  03/25/2007     EF 78%   .  Endometrial ablation  09/2010     and D&C    Current Outpatient Prescriptions   Medication  Sig  Dispense  Refill   .  amLODipine (NORVASC) 5 MG tablet  Take 1 tablet (5 mg total) by mouth daily.  90 tablet  1   .  bacitracin-polymyxin b (POLYSPORIN) ophthalmic ointment      .  HYDROcodone-acetaminophen (NORCO) 5-325 MG per tablet      .  metoprolol succinate (TOPROL-XL) 50 MG 24 hr tablet  Take 1 tablet (50 mg total) by mouth daily.  90 tablet  1   .  omeprazole (PRILOSEC) 20 MG capsule  Take one tablet  by mouth before dinner  30 capsule  0   .  topiramate (TOPAMAX) 50 MG tablet      .  cephALEXin (KEFLEX) 500 MG capsule      .  ciprofloxacin (CILOXAN) 0.3 % ophthalmic solution      .  fluconazole (DIFLUCAN) 150 MG tablet      .  MOVIPREP 100 G SOLR  Take 1 kit (100 g total) by mouth once.  1 kit  0    Allergies   Allergen  Reactions   .  Percocet (Oxycodone-Acetaminophen)    PE  BP 132/86  Pulse 71  Temp(Src) 98.9 F (37.2 C) (Temporal)  Resp 16  Ht 5' 5.75" (1.67 m)  Wt 258 lb 6.4 oz (117.209 kg)  BMI 42.02 kg/m2  LMP 04/09/2012  General: A morbidly obese Afro-American female in no distress  Skin: Warm and dry no rash or infection  HEENT: No palpable mass or thyromegaly. Sclera nonicteric. Oropharynx clear.  Lungs: Breath sounds clear without increased work of breathing  or wheezing  Cardiovascular: Regular rate and rhythm without murmur. No edema.  Abdomen: Central obesity. The patient to incident maneuver there is a fairly large upper midline diastases. This is not apparent understanding. There is however a discrete hernia mass about 5 cm in diameter by 5 cm above her umbilicus at a previous laparoscopic port incision. I suspect the actual defect was fairly small. It is tender and partially reducible. No other masses organomegaly and no hernias in her lower abdomen palpable.  Extremities: No deformity or edema  Neuro: Alert and fully oriented. Gait normal.  Assessment and plan: Symptomatic ventral incisional hernia through an old supraumbilical laparoscopic port site. The actual discrete defect feels to be fairly small. She has a large diastasis which we again discussed I would not repair. We will plan to repair the discrete defect laparoscopically. We discussed the procedure in detail including indications, risks of anesthetic complications, bleeding, infection, intestinal injury, possibly for open surgery, and recurrence. She understands she has increased risk factors due to the  obesity and cigarette use. She desires to proceed with surgery

## 2012-06-16 ENCOUNTER — Encounter (HOSPITAL_COMMUNITY): Payer: Self-pay | Admitting: General Surgery

## 2012-06-16 LAB — CBC
HCT: 40.3 % (ref 36.0–46.0)
MCHC: 33.5 g/dL (ref 30.0–36.0)
MCV: 89 fL (ref 78.0–100.0)
RDW: 13.6 % (ref 11.5–15.5)

## 2012-06-16 MED ORDER — HYDROCODONE-ACETAMINOPHEN 5-325 MG PO TABS: 1.0000 | ORAL_TABLET | ORAL | Status: AC | PRN

## 2012-06-16 MED ORDER — PNEUMOCOCCAL VAC POLYVALENT 25 MCG/0.5ML IJ INJ
0.5000 mL | INJECTION | Freq: Once | INTRAMUSCULAR | Status: AC
  Administered 2012-06-16: 0.5 mL via INTRAMUSCULAR
  Filled 2012-06-16: qty 0.5

## 2012-06-16 NOTE — Discharge Summary (Signed)
   Patient ID: Lisa [name] [id number] 51 y.o. 1961-11-06  06/15/2012  Discharge date and time: 06/16/2012   Admitting Physician: Glenna Fellows T  Discharge Physician: Glenna Fellows T  Admission Diagnoses: ventral hernia  Discharge Diagnoses: same  Operations: Procedure(s): LAPAROSCOPIC VENTRAL HERNIA  Admission Condition: good  Discharged Condition: good  Indication for Admission: patient is a 51 year old female who presents with a gradually enlarging and painful incisional hernia through an old supraumbilical Hassan trocar sites status post laparoscopic cholecystectomy. She has a previous history of lower abdominal laparoscopic ventral hernia repair. We recommended proceeding with laparoscopic repair of her hernia and she was admitted overnight following this procedure.  Hospital Course: patient was admitted following an uneventful laparoscopic repair of her supraumbilical hernia. Her postoperative course was uneventful. On the first postoperative morning she was very sore but was ambulatory. She is tolerating liquids and is hungry. She feels comfortable going home. Abdomen is soft with minimal incisional tenderness. Incisions are clean and dry. She is afebrile with stable vital signs.   Disposition: Home  Patient Instructions:   Lisa [name], Lisa [name]  Home Medication Instructions [id number]   Printed on:06/16/12 9562  Medication Information                    acetaminophen (TYLENOL) 500 MG tablet Take 1,000 mg by mouth every 6 (six) hours as needed. Pain           amLODipine (NORVASC) 5 MG tablet Take 5 mg by mouth at bedtime.           metoprolol succinate (TOPROL-XL) 50 MG 24 hr tablet Take 50 mg by mouth at bedtime.           omeprazole (PRILOSEC) 20 MG capsule every morning. Take one tablet by mouth before dinner           HYDROcodone-acetaminophen (NORCO/VICODIN) 5-325 MG per tablet Take 1-2 tablets by mouth every 4 (four) hours as needed.               Activity: no heavy lifting for 4 weeks Diet: regular diet Wound Care: none needed  Follow-up:  With Dr. Johna Sheriff in 2 weeks.  Signed: Mariella Saa MD, FACS  06/16/2012, 7:11 AM

## 2012-06-17 ENCOUNTER — Telehealth (INDEPENDENT_AMBULATORY_CARE_PROVIDER_SITE_OTHER): Payer: Self-pay

## 2012-06-17 NOTE — Telephone Encounter (Signed)
Pt called wanting to know if stinging is normal at surgery site. Pt advised stinging,burning and some discomfort is to be expected after a hernia repair. Pt has not been using ice packs. Pt advised to use ice packs and watch for signs of bleeding or infection. Symptoms of bleeding and infection reviewed with pt. Pt states she understands.

## 2012-06-20 ENCOUNTER — Telehealth (INDEPENDENT_AMBULATORY_CARE_PROVIDER_SITE_OTHER): Payer: Self-pay

## 2012-06-20 NOTE — Telephone Encounter (Signed)
Pt called stating she is having constipation and MOM is not helping. Pt advised to switch to Miralax and to use small fleets enema if needed. Pt advised to increase fluids and call if not improving.

## 2012-07-06 ENCOUNTER — Encounter (INDEPENDENT_AMBULATORY_CARE_PROVIDER_SITE_OTHER): Payer: Self-pay | Admitting: General Surgery

## 2012-07-06 ENCOUNTER — Ambulatory Visit (INDEPENDENT_AMBULATORY_CARE_PROVIDER_SITE_OTHER): Payer: Managed Care, Other (non HMO) | Admitting: General Surgery

## 2012-07-06 VITALS — BP 136/92 | HR 84 | Temp 97.2°F | Resp 20 | Ht 65.75 in | Wt 254.6 lb

## 2012-07-06 DIAGNOSIS — Z09 Encounter for follow-up examination after completed treatment for conditions other than malignant neoplasm: Secondary | ICD-10-CM

## 2012-07-06 NOTE — Progress Notes (Signed)
History: Patient returns 3 weeks following laparoscopic repair of an epigastric trocar site hernia. She feels well and her preoperative pain has been relieved. She is back to normal activities. She notices a slightly tender lump at the hernia site.  Exam: Wounds are well-healed. There is a true 3 cm indurated area at the hernia site but no evidence of recurrence with coughing or straining.  Assessment and plan: Doing well postop. She has likely an area of inflamed hernia sac that should resolve over time. I will see her back in 2 months to assess this area.

## 2012-09-01 ENCOUNTER — Encounter (INDEPENDENT_AMBULATORY_CARE_PROVIDER_SITE_OTHER): Payer: Self-pay | Admitting: General Surgery

## 2012-09-01 ENCOUNTER — Ambulatory Visit (INDEPENDENT_AMBULATORY_CARE_PROVIDER_SITE_OTHER): Payer: Managed Care, Other (non HMO) | Admitting: General Surgery

## 2012-09-01 VITALS — BP 152/88 | HR 76 | Temp 97.0°F | Resp 20 | Ht 66.0 in | Wt 264.8 lb

## 2012-09-01 DIAGNOSIS — T8112XA Postprocedural septic shock, initial encounter: Secondary | ICD-10-CM

## 2012-09-01 NOTE — Progress Notes (Signed)
History: Patient returns for followup of her laparoscopic trocar site hernia repair. She states she is no longer having any specific discomfort at the hernia site. She states she is however having crampy midabdominal pain after eating and then having frequent diarrhea following this.  Exam: Abdominal exam shows still about a 2 cm nontender mass at the hernia site. There is no hernia impulse in this feels like either a seroma or scarred hernia sac.  I attempted aspiration with an 18-gauge needle but could not obtain fluid.  Assessment and plan: Status post lap ventral hernia repair. She has probably had an indurated hernia sac palpable at the site but no evidence of recurrent hernia or complication. She has some ongoing GI symptoms that I can not really relate to her surgery. She has seen Dr. Russella Dar in the past and I suggested that she may want to see him in regards to possible IBS or other GI problems. She is appropriately concerned about her weight and we discussed referral to nutrition counseling for weight loss program. She has been interested in weight loss surgery in the past but finances and held her back this is something that she may be able to consider in the future. Return for postop check as needed.

## 2012-09-01 NOTE — Patient Instructions (Signed)
Scheduled visit with Dr. Russella Dar to discuss GI issues

## 2012-09-06 ENCOUNTER — Ambulatory Visit: Payer: Managed Care, Other (non HMO) | Admitting: *Deleted

## 2012-10-12 ENCOUNTER — Other Ambulatory Visit: Payer: Self-pay | Admitting: *Deleted

## 2012-10-12 MED ORDER — METOPROLOL SUCCINATE ER 50 MG PO TB24: 50.0000 mg | ORAL_TABLET | Freq: Every day | ORAL | Status: DC

## 2012-10-12 MED ORDER — AMLODIPINE BESYLATE 5 MG PO TABS: 5.0000 mg | ORAL_TABLET | Freq: Every day | ORAL | Status: DC

## 2012-10-12 NOTE — Telephone Encounter (Signed)
Pt needs appointment then refill can be made Fax Received. Refill Completed. Rabiah Goeser Chowoe (R.M.A)   

## 2012-10-12 NOTE — Telephone Encounter (Signed)
Pt needs appointment then refill can be made Fax Received. Refill Completed. Lisa [name] (R.M.A)   

## 2012-11-30 HISTORY — PX: COLONOSCOPY: SHX174

## 2013-01-11 ENCOUNTER — Other Ambulatory Visit: Payer: Self-pay | Admitting: *Deleted

## 2013-01-11 MED ORDER — AMLODIPINE BESYLATE 5 MG PO TABS: 5.0000 mg | ORAL_TABLET | Freq: Every day | ORAL | Status: DC

## 2013-01-11 MED ORDER — METOPROLOL SUCCINATE ER 50 MG PO TB24: 50.0000 mg | ORAL_TABLET | Freq: Every day | ORAL | Status: DC

## 2013-01-11 NOTE — Telephone Encounter (Signed)
Fax Received. Refill Completed. Beverly Suriano Chowoe (R.M.A)   

## 2013-01-16 ENCOUNTER — Other Ambulatory Visit: Payer: Self-pay | Admitting: Cardiovascular Disease

## 2013-01-16 NOTE — Telephone Encounter (Signed)
Per refill line  Pt received a 30 day supply however insurance will not pay for 30 days she needs a 90 day supply sent in please (has an appt next mth)

## 2013-01-18 ENCOUNTER — Telehealth: Payer: Self-pay | Admitting: Cardiovascular Disease

## 2013-01-18 ENCOUNTER — Other Ambulatory Visit: Payer: Self-pay | Admitting: *Deleted

## 2013-01-18 MED ORDER — METOPROLOL SUCCINATE ER 50 MG PO TB24: 50.0000 mg | ORAL_TABLET | Freq: Every day | ORAL | Status: DC

## 2013-01-18 MED ORDER — AMLODIPINE BESYLATE 5 MG PO TABS: 5.0000 mg | ORAL_TABLET | Freq: Every day | ORAL | Status: DC

## 2013-01-18 NOTE — Telephone Encounter (Signed)
New problem    Insurance will only cover 90 day supply not 30 days .     Amlodipine  5 mg   Metoprolol  50 mg    [facility] [address] .

## 2013-01-18 NOTE — Telephone Encounter (Signed)
Opened in Error.

## 2013-01-18 NOTE — Telephone Encounter (Signed)
Refills already sent to patients pharmacy 01-11-13

## 2013-01-18 NOTE — Telephone Encounter (Signed)
90 tablets sent to pharmacy. NO REFILLS UNTIL APPOINTMENT.

## 2013-01-19 ENCOUNTER — Other Ambulatory Visit: Payer: Self-pay | Admitting: *Deleted

## 2013-01-19 NOTE — Telephone Encounter (Signed)
Opened in Error.

## 2013-01-25 ENCOUNTER — Ambulatory Visit (INDEPENDENT_AMBULATORY_CARE_PROVIDER_SITE_OTHER): Payer: Managed Care, Other (non HMO) | Admitting: Cardiovascular Disease

## 2013-01-25 ENCOUNTER — Encounter: Payer: Self-pay | Admitting: Cardiovascular Disease

## 2013-01-25 VITALS — BP 148/90 | HR 69 | Ht 66.0 in | Wt 267.0 lb

## 2013-01-25 DIAGNOSIS — I10 Essential (primary) hypertension: Secondary | ICD-10-CM

## 2013-01-25 MED ORDER — METOPROLOL SUCCINATE ER 50 MG PO TB24: 50.0000 mg | ORAL_TABLET | Freq: Every day | ORAL | Status: DC

## 2013-01-25 MED ORDER — AMLODIPINE BESYLATE 5 MG PO TABS: 5.0000 mg | ORAL_TABLET | Freq: Every day | ORAL | Status: DC

## 2013-01-25 NOTE — Assessment & Plan Note (Signed)
Lisa [name] presents today with a persistent elevated blood pressure. She's gained weight over the past 6 months. She admits to eating a lot of salty foods and a lot of other doctors.  I stressed the importance of a good low-fat, low-salt diet. I stressed the importance of regular exercise and weight loss plan. We considered restarting hydrochlorothiazide but she had a bad reaction to that in the past. She developed headaches in general he was unable to tolerate the drug.  We will have her work on a good diet, exercise, and weight loss program. I'll see her again in 6 months.

## 2013-01-25 NOTE — Progress Notes (Addendum)
Lisa [name] Date of Birth  06/30/1961 [facility] [address]    [address] [place], [place]  [address] [phone]  Fax  [phone]  History of Present Illness:  Lisa [name] Is a 52 year old female with a history of hypertension. He's had some occasional episodes of chest discomfort.  These episodes of chest pain occur at times and are not necessarily associated with eating, drinking, change of position, or exercise.  She is walking on the treadmill 3 times a week.  She does not have chest pain while she is walking.  Feb. 26, 2014:  Lisa [name] is doing well.  She has gained weight since I last saw her.  She has not been walking as much.  Current Outpatient Prescriptions on File Prior to Visit  Medication Sig Dispense Refill  . acetaminophen (TYLENOL) 500 MG tablet Take 1,000 mg by mouth every 6 (six) hours as needed. Pain      . amLODipine (NORVASC) 5 MG tablet Take 1 tablet (5 mg total) by mouth at bedtime.  90 tablet  0  . metoprolol succinate (TOPROL-XL) 50 MG 24 hr tablet Take 1 tablet (50 mg total) by mouth at bedtime.  90 tablet  0  . omeprazole (PRILOSEC) 20 MG capsule every morning. Take one tablet by mouth before dinner       Current Facility-Administered Medications on File Prior to Visit  Medication Dose Route Frequency Provider Last Rate Last Dose  . albuterol (PROVENTIL,VENTOLIN) solution continuous neb  2.5 mg Nebulization Once Meryl Dare, MD,FACG        Allergies  Allergen Reactions  . Percocet (Oxycodone-Acetaminophen) Other (See Comments)    "MAKES ME EVIL"    Past Medical History  Diagnosis Date  . Hypertension   . Chest pain, non-cardiac   . Obesity   . Cigarette smoker   . IBS (irritable bowel syndrome)   . GERD (gastroesophageal reflux disease)   . Diverticulosis   . Pyloric erosion   . Allergic rhinitis   . Sleep apnea     CPAP setting varies from 4-10  . Chronic headaches   . Breast nodule     right breast, to see dr Vincente Poli 06-20-2012  for   . Complication of anesthesia 05-17-2012    trouble breathing after colonscopy, needed nebulizer    Past Surgical History  Procedure Laterality Date  . Cesarean section  2003, 2009    x 2  . Laparoscopy    . Umbilical hernia repair  2004  . US echocardiography  05/08/2008    EF 55-60%  . Cardiovascular stress test  03/25/2007    EF 78%  . Endometrial ablation  09/2010    and D&C  . Cardiac catheterization  12/28/2007    EF 75%. IT REVEALS NORMAL/SUPRANORMAL LEFT VENTRICULAR SYSTOLIC FUNCTION  . Cholecystectomy  1993  . Hernia repair  2004    umbilical  . Ventral hernia repair  06/15/2012    Procedure: LAPAROSCOPIC VENTRAL HERNIA;  Surgeon: Mariella Saa, MD;  Location: [facility];  Service: General;  Laterality: N/A;  LAPAROSCOPIC REPAIR VENTRAL HERNIA    History  Smoking status  . Current Every Day Smoker -- 0.50 packs/day for 25 years  . Types: Cigarettes  Smokeless tobacco  . Never Used    History  Alcohol Use  . Yes    Comment: very rare    Family History  Problem Relation Age of Onset  . Hypertension Mother   . Diabetes Maternal Grandmother   .  Colon cancer Neg Hx     Reviw of Systems:  Reviewed in the HPI.  All other systems are negative.  Physical Exam: BP 148/90  Pulse 69  Ht 5\' 6"  (1.676 m)  Wt 267 lb (121.11 kg)  BMI 43.12 kg/m2 The patient is alert and oriented x 3.  The mood and affect are normal.   Skin: warm and dry.  Color is normal.    HEENT:   Normal carotids, no JVD, neck is supple  Lungs: clear   Heart: RR, normal     Abdomen: + BS, no HSM  Extremities:  No c/c/w   Neuro:  Non focal     ECG: Feb. 26, 2014: NSR at 82.Marland Kitchen No ST or T wave abn.  Assessment / Plan:

## 2013-01-25 NOTE — Patient Instructions (Addendum)
Your physician wants you to follow-up in: 6 months  You will receive a reminder letter in the mail two months in advance. If you don't receive a letter, please call our office to schedule the follow-up appointment.   Your physician recommends that you continue on your current medications as directed. Please refer to the Current Medication list given to you today.  REDUCE HIGH SODIUM FOODS LIKE CANNED SOUP, GRAVY, SAUCES, READY PREPARED FOODS LIKE FROZEN FOODS; LEAN CUISINE, LASAGNA. BACON, SAUSAGE, LUNCH MEAT, FAST FOODS.Marland Kitchen No chips or hot dogs  DASH Diet The DASH diet stands for "Dietary Approaches to Stop Hypertension." It is a healthy eating plan that has been shown to reduce high blood pressure (hypertension) in as little as 14 days, while also possibly providing other significant health benefits. These other health benefits include reducing the risk of breast cancer after menopause and reducing the risk of type 2 diabetes, heart disease, colon cancer, and stroke. Health benefits also include weight loss and slowing kidney failure in patients with chronic kidney disease.  DIET GUIDELINES  Limit salt (sodium). Your diet should contain less than 1500 mg of sodium daily.  Limit refined or processed carbohydrates. Your diet should include mostly whole grains. Desserts and added sugars should be used sparingly.  Include small amounts of heart-healthy fats. These types of fats include nuts, oils, and tub margarine. Limit saturated and trans fats. These fats have been shown to be harmful in the body. CHOOSING FOODS  The following food groups are based on a 2000 calorie diet. See your Registered Dietitian for individual calorie needs. Grains and Grain Products (6 to 8 servings daily)  Eat More Often: Whole-wheat bread, brown rice, whole-grain or wheat pasta, quinoa, popcorn without added fat or salt (air popped).  Eat Less Often: White bread, white pasta, white rice, cornbread. Vegetables (4 to 5  servings daily)  Eat More Often: Fresh, frozen, and canned vegetables. Vegetables may be raw, steamed, roasted, or grilled with a minimal amount of fat.  Eat Less Often/Avoid: Creamed or fried vegetables. Vegetables in a cheese sauce. Fruit (4 to 5 servings daily)  Eat More Often: All fresh, canned (in natural juice), or frozen fruits. Dried fruits without added sugar. One hundred percent fruit juice ( cup [237 mL] daily).  Eat Less Often: Dried fruits with added sugar. Canned fruit in light or heavy syrup. [employer], Fish, and Poultry (2 servings or less daily. One serving is 3 to 4 oz [85-114 g]).  Eat More Often: Ninety percent or leaner ground beef, tenderloin, sirloin. Round cuts of beef, chicken breast, [place] breast. All fish. Grill, bake, or broil your meat. Nothing should be fried.  Eat Less Often/Avoid: Fatty cuts of meat, [place], or chicken leg, thigh, or wing. Fried cuts of meat or fish. Dairy (2 to 3 servings)  Eat More Often: Low-fat or fat-free milk, low-fat plain or light yogurt, reduced-fat or part-skim cheese.  Eat Less Often/Avoid: Milk (whole, 2%).Whole milk yogurt. Full-fat cheeses. Nuts, Seeds, and Legumes (4 to 5 servings per week)  Eat More Often: All without added salt.  Eat Less Often/Avoid: Salted nuts and seeds, canned beans with added salt. Fats and Sweets (limited)  Eat More Often: Vegetable oils, tub margarines without trans fats, sugar-free gelatin. Mayonnaise and salad dressings.  Eat Less Often/Avoid: Coconut oils, palm oils, butter, stick margarine, cream, half and half, cookies, candy, pie. FOR MORE INFORMATION The Dash Diet Eating Plan: www.dashdiet.org Document Released: 11/05/2011 Document Revised: 02/08/2012 Document Reviewed: 11/05/2011  [employer] Patient Information 2013 [place], [place].  Exercise to Stay Healthy Exercise helps you become and stay healthy. EXERCISE IDEAS AND TIPS Choose exercises that:  You enjoy.  Fit into your  day. You do not need to exercise really hard to be healthy. You can do exercises at a slow or medium level and stay healthy. You can:  Stretch before and after working out.  Try yoga, Pilates, or tai chi.  Lift weights.  Walk fast, swim, jog, run, climb stairs, bicycle, dance, or rollerskate.  Take aerobic classes. Exercises that burn about 150 calories:  Running 1  miles in 15 minutes.  Playing volleyball for 45 to 60 minutes.  Washing and waxing a car for 45 to 60 minutes.  Playing touch football for 45 minutes.  Walking 1  miles in 35 minutes.  Pushing a stroller 1  miles in 30 minutes.  Playing basketball for 30 minutes.  Raking leaves for 30 minutes.  Bicycling 5 miles in 30 minutes.  Walking 2 miles in 30 minutes.  Dancing for 30 minutes.  Shoveling snow for 15 minutes.  Swimming laps for 20 minutes.  Walking up stairs for 15 minutes.  Bicycling 4 miles in 15 minutes.  Gardening for 30 to 45 minutes.  Jumping rope for 15 minutes.  Washing windows or floors for 45 to 60 minutes. Document Released: 12/19/2010 Document Revised: 02/08/2012 Document Reviewed: 12/19/2010 [facility] Patient Information 2013 [place], [place].

## 2013-01-27 ENCOUNTER — Encounter: Payer: Self-pay | Admitting: Cardiovascular Disease

## 2013-01-27 ENCOUNTER — Encounter: Payer: Self-pay | Admitting: Internal Medicine

## 2013-05-17 ENCOUNTER — Other Ambulatory Visit: Payer: Self-pay | Admitting: Obstetrics and Gynecology

## 2013-07-04 ENCOUNTER — Telehealth: Payer: Self-pay | Admitting: Cardiovascular Disease

## 2013-07-04 MED ORDER — METOPROLOL SUCCINATE ER 50 MG PO TB24: 50.0000 mg | ORAL_TABLET | Freq: Every day | ORAL | Status: DC

## 2013-07-04 MED ORDER — AMLODIPINE BESYLATE 5 MG PO TABS: 5.0000 mg | ORAL_TABLET | Freq: Every day | ORAL | Status: DC

## 2013-07-04 NOTE — Telephone Encounter (Signed)
NEEDS 30 DAY TILL APP/ DONE

## 2013-07-04 NOTE — Telephone Encounter (Signed)
New Prob ° ° ° °Pt has some questions regarding her medications. Please call. °

## 2013-07-11 ENCOUNTER — Telehealth: Payer: Self-pay | Admitting: Neurology

## 2013-07-13 NOTE — Telephone Encounter (Signed)
HA's are waking her up out of her sleep. Temporal area. After waking , HA subsides. Still using CPAP as directed. HA's started 07/09/13. Not every night, but every other night since then. Pain 11 on pain scale. Feels off balance and "woozy" during the day. Had last OV w/ Dr. Marjory Lies on 04/15/2012, was referred to Dr. Vickey Huger for sleep consult [facility] mgmt. Had OV with Dr. Vickey Huger on 07/26/12, disposition: sleep clinic RV in 10 months (july 2014), dr. Marjory Lies rv in 4 months. Patient did not f/u as directed. Spoke to Dr. Vickey Huger advised OV f/u w/ Dr. Marjory Lies (could be cluster HA rather than sleep issue), and to schedule visit w/ sleep clinic to check CPAP. Called patient scheduled both visits.

## 2013-07-26 ENCOUNTER — Encounter: Payer: Self-pay | Admitting: Diagnostic Neuroimaging

## 2013-07-26 ENCOUNTER — Ambulatory Visit (INDEPENDENT_AMBULATORY_CARE_PROVIDER_SITE_OTHER): Payer: Managed Care, Other (non HMO) | Admitting: Diagnostic Neuroimaging

## 2013-07-26 VITALS — BP 158/88 | HR 70 | Temp 98.7°F | Ht 66.0 in | Wt 262.5 lb

## 2013-07-26 DIAGNOSIS — G43009 Migraine without aura, not intractable, without status migrainosus: Secondary | ICD-10-CM | POA: Insufficient documentation

## 2013-07-26 MED ORDER — SUMATRIPTAN SUCCINATE 100 MG PO TABS: 100.0000 mg | ORAL_TABLET | Freq: Once | ORAL | Status: DC | PRN

## 2013-07-26 MED ORDER — TOPIRAMATE 50 MG PO TABS: 50.0000 mg | ORAL_TABLET | Freq: Two times a day (BID) | ORAL | Status: DC

## 2013-07-26 NOTE — Progress Notes (Signed)
[facility]  PATIENT: Lisa [name] DOB: [birth year]  REFERRING CLINICIAN:  HISTORY FROM: patient (accompanied by 2 grandsons) REASON FOR VISIT: follow up   HISTORICAL  CHIEF COMPLAINT:  Chief Complaint  Patient presents with  . Follow-up    HISTORY OF PRESENT ILLNESS:   UPDATE 07/26/13: Since last visit patient's headaches have slightly changed. Now she has a throbbing bilateral temporal, facial, neck pain and headache sensation. No nausea vomiting. She has a sensitivity to light. Sometimes wakes up in the middle the night with these headaches. No more thunderclap headaches. Patient is on CPAP for sleep apnea. Patient is also asking about possible Bell's palsy, which her grandson's endocrinologist was concerned may have developed over the past 3 months. Patient denies any new unilateral facial weakness or numbness.  UPDATE 04/15/12: Since last visit, HA reduced. Had 2 HA since last visit. Not taken any meds for this. Questions about dx and tx reviewed again. No aggravating factors.  PRIOR HPI (03/01/12): 52 year old left-handed female with history of hypertension, sleep apnea, here for evaluation of increasing headaches.  Patient has at least 10 year history of intermittent headaches.  Her typical headaches consist of pressure and throbbing sensation, nausea.  Patient developed a new type of headache in February 2013 while straining on the bathroom.  She states felt a sensation of "fabric ripping" in her head.  Peak headache intensity was quite rapid.  After 30 min. headaches started to reduced in severity.  Few days later patient had developed a similar headache sensation while having sexual intercourse near climax.  Patient has had CT of the head, MRI brain, MRA head since that time.  She is taking hydrocodone with Tylenol as needed for headache.  Since that time her headaches have subsided, but not fully recovered.  No specific triggering factors.  REVIEW OF  SYSTEMS: Full 14 system review of systems performed and notable only for feeling hot joint pain cramps diarrhea microscopic blood in urine (without kidney stones) ringing in ears spinning sensation.  ALLERGIES: Allergies  Allergen Reactions  . Percocet [Oxycodone-Acetaminophen] Other (See Comments)    "MAKES ME MEAN"    HOME MEDICATIONS: Prior to Admission medications   Medication Sig Start Date End Date Taking? Authorizing Provider  acetaminophen (TYLENOL) 500 MG tablet Take 1,000 mg by mouth every 6 (six) hours as needed. Pain   Yes Historical Provider, MD  amLODipine (NORVASC) 5 MG tablet Take 1 tablet (5 mg total) by mouth at bedtime. 07/04/13  Yes Vesta Mixer, MD  HYDROcodone-acetaminophen (NORCO/VICODIN) 5-325 MG per tablet Take 1 tablet by mouth every 4 (four) hours as needed.  11/19/12  Yes Historical Provider, MD  metoprolol succinate (TOPROL-XL) 50 MG 24 hr tablet Take 1 tablet (50 mg total) by mouth at bedtime. 07/04/13  Yes Vesta Mixer, MD  SUMAtriptan (IMITREX) 100 MG tablet Take 1 tablet (100 mg total) by mouth once as needed for migraine. May repeat x 1 after 2 hours; maximum 2 tabs per day and 8 tabs per month 07/26/13   Suanne Marker, MD  topiramate (TOPAMAX) 50 MG tablet Take 1 tablet (50 mg total) by mouth 2 (two) times daily. 07/26/13   Suanne Marker, MD   Outpatient Prescriptions Prior to Visit  Medication Sig Dispense Refill  . acetaminophen (TYLENOL) 500 MG tablet Take 1,000 mg by mouth every 6 (six) hours as needed. Pain      . amLODipine (NORVASC) 5 MG tablet Take 1 tablet (5 mg total)  by mouth at bedtime.  30 tablet  0  . HYDROcodone-acetaminophen (NORCO/VICODIN) 5-325 MG per tablet Take 1 tablet by mouth every 4 (four) hours as needed.       . metoprolol succinate (TOPROL-XL) 50 MG 24 hr tablet Take 1 tablet (50 mg total) by mouth at bedtime.  30 tablet  0  . omeprazole (PRILOSEC) 20 MG capsule every morning. Take one tablet by mouth before dinner         Facility-Administered Medications Prior to Visit  Medication Dose Route Frequency Provider Last Rate Last Dose  . albuterol (PROVENTIL,VENTOLIN) solution continuous neb  2.5 mg Nebulization Once Meryl Dare, MD        PAST MEDICAL HISTORY: Past Medical History  Diagnosis Date  . Hypertension   . Chest pain, non-cardiac   . Obesity   . Cigarette smoker   . IBS (irritable bowel syndrome)   . GERD (gastroesophageal reflux disease)   . Diverticulosis   . Pyloric erosion   . Allergic rhinitis   . Sleep apnea     CPAP setting varies from 4-10  . Chronic headaches   . Breast nodule     right breast, to see dr Vincente Poli 06-20-2012 for   . Complication of anesthesia 05-17-2012    trouble breathing after colonscopy, needed nebulizer    PAST SURGICAL HISTORY: Past Surgical History  Procedure Laterality Date  . Cesarean section  2003, 2009    x 2  . Laparoscopy    . Umbilical hernia repair  2004  . US echocardiography  05/08/2008    EF 55-60%  . Cardiovascular stress test  03/25/2007    EF 78%  . Endometrial ablation  09/2010    and D&C  . Cardiac catheterization  12/28/2007    EF 75%. IT REVEALS NORMAL/SUPRANORMAL LEFT VENTRICULAR SYSTOLIC FUNCTION  . Cholecystectomy  1993  . Hernia repair  2004    umbilical  . Ventral hernia repair  06/15/2012    Procedure: LAPAROSCOPIC VENTRAL HERNIA;  Surgeon: Mariella Saa, MD;  Location: [facility];  Service: General;  Laterality: N/A;  LAPAROSCOPIC REPAIR VENTRAL HERNIA    FAMILY HISTORY: Family History  Problem Relation Age of Onset  . Hypertension Mother   . Diabetes Maternal Grandmother   . Colon cancer Neg Hx     SOCIAL HISTORY:  History   Social History  . Marital Status: Married    Spouse Name: [name]    Number of Children: 2  . Years of Education: 14+   Occupational History  . 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 Smoking status: Current Every Day Smoker -- 0.50 packs/day for 25 years     Types: Cigarettes  . Smokeless tobacco: Never Used  . Alcohol Use: Yes     Comment: very rare  . Drug Use: No  . Sexual Activity: Not on file   Other Topics Concern  . Not on file   Social History Narrative   Patient lives at her home with her family.   Caffeine Use: 1 soda occasionally     PHYSICAL EXAM  Filed Vitals:   07/26/13 1340  BP: 158/88  Pulse: 70  Temp: 98.7 F (37.1 C)  TempSrc: Oral  Height: 5\' 6"  (1.676 m)  Weight: 262 lb 8 oz (119.069 kg)    Not recorded    Body mass index is 42.39 kg/(m^2).   General: Patient is awake, alert and in no  acute distress.  Well developed and groomed. SMELLS OF CIG SMOKE.  Neck: Neck is supple. Cardiovascular: No carotid artery bruits.  Heart is regular rate and rhythm with no murmurs.  Neurologic Exam  Mental Status: Awake, alert. Language is fluent and comprehension intact. Cranial Nerves: LEFT EYE CONGENTIAL MALFORMATION WITH PROSTHESIS. NO LIGHT PERCEPTION. NO RXN TO LIGHT. DECREASED LEFT EYE MOVEMENTS. LEFT EYELID LOWER THAN RIGHT. RIGHT LOWER FACIAL MOVEMENTS DECREASED, WITH DECR RIGHT NL FOLD. APPEARS SIMILAR TO 2011 DRIVER'S LICENCE PICTURE. RIGHT PUPIUL REACTIVE. Visual field IN RIGHT EYE NORMAL. Conjugate eye movements are full and symmetric IN RIGHT EYE. Facial sensation symmetric.  Hearing is intact.  Palate elevated symmetrically and uvula is midline.  Shoulder shrug is symmetric.  Tongue is midline. Motor: Normal bulk and tone.  Full strength in the upper and lower extremities.  No pronator drift. Sensory: Intact and symmetric to light touch. Coordination: No ataxia or dysmetria on finger-nose or rapid alternating movement testing. Gait and Station: Narrow based gait.   DIAGNOSTIC DATA (LABS, IMAGING, TESTING) - I reviewed patient records, labs, notes, testing and imaging myself where available.  Lab Results  Component Value Date   WBC 12.3* 06/16/2012   HGB 13.5 06/16/2012   HCT 40.3 06/16/2012   MCV 89.0  06/16/2012   PLT 259 06/16/2012      Component Value Date/Time   NA 138 06/08/2012 1135   K 4.0 06/08/2012 1135   CL 104 06/08/2012 1135   CO2 25 06/08/2012 1135   GLUCOSE 102* 06/08/2012 1135   BUN 18 06/08/2012 1135   CREATININE 1.06 06/08/2012 1135   CALCIUM 9.7 06/08/2012 1135   PROT 7.2 09/24/2008 1736   ALBUMIN 3.3* 09/24/2008 1736   AST 17 09/24/2008 1736   ALT 34 09/24/2008 1736   ALKPHOS 79 09/24/2008 1736   BILITOT 0.5 09/24/2008 1736   GFRNONAA 61* 06/08/2012 1135   GFRAA 71* 06/08/2012 1135   No results found for this basename: CHOL, HDL, LDLCALC, LDLDIRECT, TRIG, CHOLHDL   No results found for this basename: HGBA1C   No results found for this basename: VITAMINB12   Lab Results  Component Value Date   TSH 1.795 *Test methodology is 3rd generation TSH* 12/27/2007    02/12/12 MRI brain - monoventricle, absent septum pellucidum; stable from MRI on 04/03/08  02/12/12 MRA head - normal   ASSESSMENT AND PLAN  52 y.o. female with history of headaches since 2003, with new type of headache in February 2013 (primary thunderclap headache and primary coital headache vs. migraine variant), now with increasing migraines.  MRI findings are stable over time.  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  PLAN: 1. TPX + sumatriptan 2. Follow up in 3 months   Meds ordered this encounter  Medications  . topiramate (TOPAMAX) 50 MG tablet    Sig: Take 1 tablet (50 mg total) by mouth 2 (two) times daily.    Dispense:  60 tablet    Refill:  12  . SUMAtriptan (IMITREX) 100 MG tablet    Sig: Take 1 tablet (100 mg total) by mouth once as needed for migraine. May repeat x 1 after 2 hours; maximum 2 tabs per day and 8 tabs per month    Dispense:  8 tablet    Refill:  6    Return in about 3 months (around 10/26/2013) for with Edison Nasuti, MD 07/26/2013, 2:31 PM Certified in Neurology, Neurophysiology and  Neuroimaging  [facility] [address] [place], [place] [address] [phone]

## 2013-07-26 NOTE — Patient Instructions (Signed)
Start topiramate 50 mg at bedtime. After 2 weeks increase to twice a day. This is a headache preventative medicine.  Use sumatriptan 100 mg as needed for breakthrough headaches.  Drink plenty of water.  Stop smoking.  Continue CPAP for sleep apnea.

## 2013-08-01 ENCOUNTER — Telehealth: Payer: Self-pay | Admitting: Nurse Practitioner

## 2013-08-01 ENCOUNTER — Ambulatory Visit (INDEPENDENT_AMBULATORY_CARE_PROVIDER_SITE_OTHER): Payer: Managed Care, Other (non HMO) | Admitting: Nurse Practitioner

## 2013-08-01 ENCOUNTER — Encounter: Payer: Self-pay | Admitting: Nurse Practitioner

## 2013-08-01 VITALS — BP 180/90 | HR 72 | Ht 66.0 in | Wt 260.4 lb

## 2013-08-01 DIAGNOSIS — I10 Essential (primary) hypertension: Secondary | ICD-10-CM

## 2013-08-01 DIAGNOSIS — E785 Hyperlipidemia, unspecified: Secondary | ICD-10-CM

## 2013-08-01 LAB — BASIC METABOLIC PANEL
BUN: 18 mg/dL (ref 6–23)
CO2: 24 mEq/L (ref 19–32)
Calcium: 9.1 mg/dL (ref 8.4–10.5)
Chloride: 103 mEq/L (ref 96–112)
Creatinine, Ser: 1 mg/dL (ref 0.4–1.2)
GFR: 72.17 mL/min (ref >60.00)
Glucose, Bld: 101 mg/dL — ABNORMAL HIGH (ref 70–99)
Potassium: 4.1 mEq/L (ref 3.5–5.1)
Sodium: 131 mEq/L — ABNORMAL LOW (ref 135–145)

## 2013-08-01 LAB — CBC WITH DIFFERENTIAL/PLATELET
Basophils Absolute: 0 10*3/uL (ref 0.0–0.1)
Basophils Relative: 0.2 % (ref 0.0–3.0)
Eosinophils Absolute: 0.1 10*3/uL (ref 0.0–0.7)
Eosinophils Relative: 1.5 % (ref 0.0–5.0)
HCT: 46.5 % — ABNORMAL HIGH (ref 36.0–46.0)
Hemoglobin: 15.8 g/dL — ABNORMAL HIGH (ref 12.0–15.0)
Lymphocytes Relative: 24.9 % (ref 12.0–46.0)
Lymphs Abs: 2.3 10*3/uL (ref 0.7–4.0)
MCHC: 33.9 g/dL (ref 30.0–36.0)
MCV: 90.2 fl (ref 78.0–100.0)
Monocytes Absolute: 0.6 10*3/uL (ref 0.1–1.0)
Monocytes Relative: 6.6 % (ref 3.0–12.0)
Neutro Abs: 6 10*3/uL (ref 1.4–7.7)
Neutrophils Relative %: 66.8 % (ref 43.0–77.0)
Platelets: 251 10*3/uL (ref 150.0–400.0)
RBC: 5.16 Mil/uL — ABNORMAL HIGH (ref 3.87–5.11)
RDW: 14.1 % (ref 11.5–14.6)
WBC: 9 10*3/uL (ref 4.5–10.5)

## 2013-08-01 LAB — HEPATIC FUNCTION PANEL
ALT: 20 U/L (ref 0–35)
AST: 12 U/L (ref 0–37)
Albumin: 3.7 g/dL (ref 3.5–5.2)
Alkaline Phosphatase: 74 U/L (ref 39–117)
Bilirubin, Direct: 0 mg/dL (ref 0.0–0.3)
Total Bilirubin: 0.5 mg/dL (ref 0.3–1.2)
Total Protein: 7.4 g/dL (ref 6.0–8.3)

## 2013-08-01 LAB — LIPID PANEL
Cholesterol: 189 mg/dL (ref 0–200)
HDL: 34.8 mg/dL — ABNORMAL LOW (ref >39.00)
LDL Cholesterol: 133 mg/dL — ABNORMAL HIGH (ref 0–99)
Total CHOL/HDL Ratio: 5
Triglycerides: 106 mg/dL (ref 0.0–149.0)
VLDL: 21.2 mg/dL (ref 0.0–40.0)

## 2013-08-01 MED ORDER — LISINOPRIL 10 MG PO TABS: 10.0000 mg | ORAL_TABLET | Freq: Every day | ORAL | Status: DC

## 2013-08-01 NOTE — Patient Instructions (Addendum)
Continue with your current medicines but I am adding Lisinopril 10 mg a day for your BP  I will see you in 2 weeks  We will check labs today  Cut back on your salt - it will help with your blood pressure.  Here are my tips to lose weight:  1. Drink only water. You do not need milk, juice, tea, soda or diet soda.  2. Do not eat anything "white". This includes white bread, potatoes, rice or mayo  3. Stay away from fried foods and sweets  4. Your portion should be the size of the palm of your hand.  5. Know what your weaknesses are and avoid.  6. Find an exercise you like and do it every day for 45 to 60 minutes.       Call the [facility] office at [phone] if you have any questions, problems or concerns.

## 2013-08-01 NOTE — Progress Notes (Signed)
Lisa [name] Date of Birth: November 08, 1961 Medical Record #[id number]  History of Present Illness: Lisa [name] is seen back today for her 6 month check. Seen for Dr. Elease Hashimoto. Has HTN, no known CAD and GERD. Normal echo back in 2009. Low risk adenosine Myoview in 2008.   Last seen back in February - had gained weight - using too much salt. Not exercising.   Comes back today. Here alone. Doing ok. BP is up. Takes her medicines at night. Says she has not missed any medicines. No chest pain. Has had headaches and blurred vision. Sees neurology as well - now on headache medicine - not checking her BP at home - cuff is broken. Not exercising. No real swelling. Still smoking. Says she knows she is doing lots of things wrong.   Current Outpatient Prescriptions  Medication Sig Dispense Refill  . acetaminophen (TYLENOL) 500 MG tablet Take 1,000 mg by mouth every 6 (six) hours as needed. Pain      . amLODipine (NORVASC) 5 MG tablet Take 1 tablet (5 mg total) by mouth at bedtime.  30 tablet  0  . HYDROcodone-acetaminophen (NORCO/VICODIN) 5-325 MG per tablet Take 1 tablet by mouth every 4 (four) hours as needed.       . metoprolol succinate (TOPROL-XL) 50 MG 24 hr tablet Take 1 tablet (50 mg total) by mouth at bedtime.  30 tablet  0  . SUMAtriptan (IMITREX) 100 MG tablet Take 1 tablet (100 mg total) by mouth once as needed for migraine. May repeat x 1 after 2 hours; maximum 2 tabs per day and 8 tabs per month  8 tablet  6  . topiramate (TOPAMAX) 50 MG tablet Take 1 tablet (50 mg total) by mouth 2 (two) times daily.  60 tablet  12   Current Facility-Administered Medications  Medication Dose Route Frequency Provider Last Rate Last Dose  . albuterol (PROVENTIL,VENTOLIN) solution continuous neb  2.5 mg Nebulization Once Meryl Dare, MD        Allergies  Allergen Reactions  . Hctz [Hydrochlorothiazide]     Causes a headache  . Percocet [Oxycodone-Acetaminophen] Other (See Comments)    "MAKES ME  MEAN"    Past Medical History  Diagnosis Date  . Hypertension   . Chest pain, non-cardiac   . Obesity   . Cigarette smoker   . IBS (irritable bowel syndrome)   . GERD (gastroesophageal reflux disease)   . Diverticulosis   . Pyloric erosion   . Allergic rhinitis   . Sleep apnea     CPAP setting varies from 4-10  . Chronic headaches   . Breast nodule     right breast, to see dr Vincente Poli 06-20-2012 for   . Complication of anesthesia 05-17-2012    trouble breathing after colonscopy, needed nebulizer    Past Surgical History  Procedure Laterality Date  . Cesarean section  2003, 2009    x 2  . Laparoscopy    . Umbilical hernia repair  2004  . US echocardiography  05/08/2008    EF 55-60%  . Cardiovascular stress test  03/25/2007    EF 78%  . Endometrial ablation  09/2010    and D&C  . Cardiac catheterization  12/28/2007    EF 75%. IT REVEALS NORMAL/SUPRANORMAL LEFT VENTRICULAR SYSTOLIC FUNCTION  . Cholecystectomy  1993  . Hernia repair  2004    umbilical  . Ventral hernia repair  06/15/2012    Procedure: LAPAROSCOPIC VENTRAL HERNIA;  Surgeon: Mariella Saa,  MD;  Location: [facility];  Service: General;  Laterality: N/A;  LAPAROSCOPIC REPAIR VENTRAL HERNIA    History  Smoking status  . Current Every Day Smoker -- 0.50 packs/day for 25 years  . Types: Cigarettes  Smokeless tobacco  . Never Used    History  Alcohol Use  . Yes    Comment: very rare    Family History  Problem Relation Age of Onset  . Hypertension Mother   . Diabetes Maternal Grandmother   . Colon cancer Neg Hx     Review of Systems: The review of systems is per the HPI.  All other systems were reviewed and are negative.  Physical Exam: BP 180/90  Pulse 72  Ht 5\' 6"  (1.676 m)  Wt 260 lb 6.4 oz (118.117 kg)  BMI 42.05 kg/m2 Patient is alert and in no acute distress. She is obese but weight is down. Skin is warm and dry. Color is normal.  HEENT is unremarkable. Normocephalic/atraumatic. PERRL.  Sclera are nonicteric. Neck is supple. No masses. No JVD. Lungs are clear. Cardiac exam shows a regular rate and rhythm. Abdomen is obese but soft. Extremities are without edema. Gait and ROM are intact. No gross neurologic deficits noted.  LABORATORY DATA: Pending    Chemistry      Component Value Date/Time   NA 138 06/08/2012 1135   K 4.0 06/08/2012 1135   CL 104 06/08/2012 1135   CO2 25 06/08/2012 1135   BUN 18 06/08/2012 1135   CREATININE 1.06 06/08/2012 1135      Component Value Date/Time   CALCIUM 9.7 06/08/2012 1135   ALKPHOS 79 09/24/2008 1736   AST 17 09/24/2008 1736   ALT 34 09/24/2008 1736   BILITOT 0.5 09/24/2008 1736     No results found for this basename: CHOL,  HDL,  LDLCALC,  LDLDIRECT,  TRIG,  CHOLHDL   Lab Results  Component Value Date   WBC 12.3* 06/16/2012   HGB 13.5 06/16/2012   HCT 40.3 06/16/2012   MCV 89.0 06/16/2012   PLT 259 06/16/2012      Assessment / Plan:  1. HTN - BP uncontrolled - was high at neuro's office recently as well - I suspect she is not adequately controlled - she does not want to go back on HCTZ - apparently did not tolerate - will add Lisinopril 10 mg a day. See her back in 2 weeks. Salt restriction is imperative.   2. Obesity - down 7 pounds - once BP is down will give her the ok to exercise. We discussed my methods of weight loss.   3. Tobacco abuse - not ready to stop at this time.   Patient is agreeable to this plan and will call if any problems develop in the interim.   Rosalio Macadamia, RN, ANP-C [facility] [address] [place], [place]  [address]

## 2013-08-01 NOTE — Telephone Encounter (Signed)
New Prob     Pt has some concerns and questions regarding her medication. Please call.

## 2013-08-09 MED ORDER — LOSARTAN POTASSIUM 50 MG PO TABS: 50.0000 mg | ORAL_TABLET | Freq: Every day | ORAL | Status: DC

## 2013-08-09 NOTE — Telephone Encounter (Signed)
Pt told to hold lisinopril. Pt intolerant of cough worsening. Told to monitor sodium intake and bp. Told her we will call her back today or tomorrow, she agreed to plan. Please advise.

## 2013-08-09 NOTE — Telephone Encounter (Signed)
New Problem  Pt was prescribed lisinopril w her recent office visit// had a bad cough/// was advised to wait at least a week to monitor and states that the cough has gotten worse.

## 2013-08-09 NOTE — Telephone Encounter (Signed)
Pt called with advice, new med explained and verbalized understanding.

## 2013-08-09 NOTE — Telephone Encounter (Signed)
She has had a cough since starting lisinopril.  DC lisinopril.  Start Losartan 50 mg a day.  She needs to quit smoking. Needs to avoid salt.

## 2013-08-15 ENCOUNTER — Encounter: Payer: Self-pay | Admitting: Nurse Practitioner

## 2013-08-15 ENCOUNTER — Telehealth: Payer: Self-pay | Admitting: Nurse Practitioner

## 2013-08-15 ENCOUNTER — Ambulatory Visit (INDEPENDENT_AMBULATORY_CARE_PROVIDER_SITE_OTHER): Payer: Managed Care, Other (non HMO) | Admitting: Nurse Practitioner

## 2013-08-15 VITALS — BP 160/90 | HR 72 | Ht 66.0 in | Wt 260.0 lb

## 2013-08-15 DIAGNOSIS — I10 Essential (primary) hypertension: Secondary | ICD-10-CM

## 2013-08-15 LAB — BASIC METABOLIC PANEL
BUN: 12 mg/dL (ref 6–23)
CO2: 28 mEq/L (ref 19–32)
Calcium: 9 mg/dL (ref 8.4–10.5)
Chloride: 105 mEq/L (ref 96–112)
Creatinine, Ser: 0.9 mg/dL (ref 0.4–1.2)
GFR: 87.5 mL/min (ref >60.00)
Glucose, Bld: 90 mg/dL (ref 70–99)
Potassium: 3.5 mEq/L (ref 3.5–5.1)
Sodium: 137 mEq/L (ref 135–145)

## 2013-08-15 MED ORDER — LOSARTAN POTASSIUM 50 MG PO TABS: 100.0000 mg | ORAL_TABLET | Freq: Every day | ORAL | Status: DC

## 2013-08-15 MED ORDER — AMLODIPINE BESYLATE 5 MG PO TABS: 5.0000 mg | ORAL_TABLET | Freq: Every day | ORAL | Status: DC

## 2013-08-15 MED ORDER — METOPROLOL SUCCINATE ER 50 MG PO TB24: 50.0000 mg | ORAL_TABLET | Freq: Every day | ORAL | Status: DC

## 2013-08-15 NOTE — Progress Notes (Signed)
Lisa [name] Date of Birth: 07-23-1961 Medical Record #[id number]  History of Present Illness: Lisa [name] is seen back today for a 2 week check. Seen for Dr. Elease Hashimoto. She has HTN, no known CAD and GERD. Normal echo back in 2009. Low risk Myoview from 2008.   Seen 2 weeks ago with increased BP. Was taking her medicines at night. Having headaches and blurred vision. Still smoking. We started ACE.   She was changed over to Losartan due to cough since her last visit here. BP has remained high.   Comes back today. Here alone. BP is a little better. Still with a cough - no real difference with the change in medicine. Just started the Losartan a few days ago. No swelling. Wanting to go to the gym. Actually considering smoking cessation and took the phone number for the smoking cessation class at [facility].   Current Outpatient Prescriptions  Medication Sig Dispense Refill  . acetaminophen (TYLENOL) 500 MG tablet Take 1,000 mg by mouth every 6 (six) hours as needed. Pain      . amLODipine (NORVASC) 5 MG tablet Take 1 tablet (5 mg total) by mouth at bedtime.  30 tablet  0  . HYDROcodone-acetaminophen (NORCO/VICODIN) 5-325 MG per tablet Take 1 tablet by mouth every 4 (four) hours as needed.       Marland Kitchen losartan (COZAAR) 50 MG tablet Take 1 tablet (50 mg total) by mouth daily.  30 tablet  6  . metoprolol succinate (TOPROL-XL) 50 MG 24 hr tablet Take 1 tablet (50 mg total) by mouth at bedtime.  30 tablet  0  . SUMAtriptan (IMITREX) 100 MG tablet Take 1 tablet (100 mg total) by mouth once as needed for migraine. May repeat x 1 after 2 hours; maximum 2 tabs per day and 8 tabs per month  8 tablet  6  . topiramate (TOPAMAX) 50 MG tablet Take 1 tablet (50 mg total) by mouth 2 (two) times daily.  60 tablet  12   Current Facility-Administered Medications  Medication Dose Route Frequency Provider Last Rate Last Dose  . albuterol (PROVENTIL,VENTOLIN) solution continuous neb  2.5 mg Nebulization Once Meryl Dare, MD        Allergies  Allergen Reactions  . Hctz [Hydrochlorothiazide]     Causes a headache  . Lisinopril Cough  . Percocet [Oxycodone-Acetaminophen] Other (See Comments)    "MAKES ME MEAN"    Past Medical History  Diagnosis Date  . Hypertension   . Chest pain, non-cardiac   . Obesity   . Cigarette smoker   . IBS (irritable bowel syndrome)   . GERD (gastroesophageal reflux disease)   . Diverticulosis   . Pyloric erosion   . Allergic rhinitis   . Sleep apnea     CPAP setting varies from 4-10  . Chronic headaches   . Breast nodule     right breast, to see dr Vincente Poli 06-20-2012 for   . Complication of anesthesia 05-17-2012    trouble breathing after colonscopy, needed nebulizer    Past Surgical History  Procedure Laterality Date  . Cesarean section  2003, 2009    x 2  . Laparoscopy    . Umbilical hernia repair  2004  . US echocardiography  05/08/2008    EF 55-60%  . Cardiovascular stress test  03/25/2007    EF 78%  . Endometrial ablation  09/2010    and D&C  . Cardiac catheterization  12/28/2007    EF 75%. IT REVEALS  NORMAL/SUPRANORMAL LEFT VENTRICULAR SYSTOLIC FUNCTION  . Cholecystectomy  1993  . Hernia repair  2004    umbilical  . Ventral hernia repair  06/15/2012    Procedure: LAPAROSCOPIC VENTRAL HERNIA;  Surgeon: Mariella Saa, MD;  Location: [facility];  Service: General;  Laterality: N/A;  LAPAROSCOPIC REPAIR VENTRAL HERNIA    History  Smoking status  . Current Every Day Smoker -- 0.50 packs/day for 25 years  . Types: Cigarettes  Smokeless tobacco  . Never Used    History  Alcohol Use  . Yes    Comment: very rare    Family History  Problem Relation Age of Onset  . Hypertension Mother   . Diabetes Maternal Grandmother   . Colon cancer Neg Hx     Review of Systems: The review of systems is per the HPI.  All other systems were reviewed and are negative.  Physical Exam: BP 160/90  Pulse 72  Ht 5\' 6"  (1.676 m)  Wt 260 lb (117.935 kg)   BMI 41.99 kg/m2 BP by me is 150/100. Patient is very pleasant and in no acute distress. She is obese. Weight is unchanged. Skin is warm and dry. Color is normal.  HEENT is unremarkable. Normocephalic/atraumatic. PERRL. Sclera are nonicteric. Neck is supple. No masses. No JVD. Lungs are clear. Cardiac exam shows a regular rate and rhythm. Abdomen is soft. Extremities are without edema. Gait and ROM are intact. No gross neurologic deficits noted.  LABORATORY DATA: BMET is pending    Chemistry      Component Value Date/Time   NA 131* 08/01/2013 0910   K 4.1 08/01/2013 0910   CL 103 08/01/2013 0910   CO2 24 08/01/2013 0910   BUN 18 08/01/2013 0910   CREATININE 1.0 08/01/2013 0910      Component Value Date/Time   CALCIUM 9.1 08/01/2013 0910   ALKPHOS 74 08/01/2013 0910   AST 12 08/01/2013 0910   ALT 20 08/01/2013 0910   BILITOT 0.5 08/01/2013 0910     Lab Results  Component Value Date   CHOL 189 08/01/2013   HDL 34.80* 08/01/2013   LDLCALC 133* 08/01/2013   TRIG 106.0 08/01/2013   CHOLHDL 5 08/01/2013    Assessment / Plan: 1. HTN - Losartan increased to 100 mg - would like for her to take in the am and not at night along with her other medicines that she takes at night. Recheck BMET today.   2. Obesity  3. Smoking - cessation encouraged. She may consider going to the classes thru [facility].  I will see her in 2 to 3 weeks.   Patient is agreeable to this plan and will call if any problems develop in the interim.   Rosalio Macadamia, RN, ANP-C [facility] [address] [place], [place]  [address]

## 2013-08-15 NOTE — Telephone Encounter (Signed)
Will forward 

## 2013-08-15 NOTE — Patient Instructions (Addendum)
I am increasing your Losartan to 100 mg a day - take this in the am  Stay on your other medicines  I will see you in 2 to 3 weeks  We will recheck your potassium level today  Get some OTC Nexium and take for 2 weeks to see if your cough goes away - if so - your cough is from reflux.  Call the [facility] office at [phone] if you have any questions, problems or concerns.

## 2013-08-15 NOTE — Telephone Encounter (Signed)
New Problem  Returning a call from [facility] about test results.

## 2013-08-18 ENCOUNTER — Ambulatory Visit: Payer: Managed Care, Other (non HMO) | Admitting: Gastroenterology

## 2013-08-21 ENCOUNTER — Ambulatory Visit: Payer: Managed Care, Other (non HMO) | Admitting: Gastroenterology

## 2013-08-23 ENCOUNTER — Ambulatory Visit (INDEPENDENT_AMBULATORY_CARE_PROVIDER_SITE_OTHER): Payer: Managed Care, Other (non HMO) | Admitting: Gastroenterology

## 2013-08-23 ENCOUNTER — Encounter: Payer: Self-pay | Admitting: Gastroenterology

## 2013-08-23 VITALS — BP 128/82 | HR 80 | Ht 66.0 in | Wt 260.0 lb

## 2013-08-23 DIAGNOSIS — R1032 Left lower quadrant pain: Secondary | ICD-10-CM | POA: Insufficient documentation

## 2013-08-23 NOTE — Progress Notes (Signed)
08/23/2013 Lisa [name] [id number] 06-Jan-1961   History of Present Illness:  Patient is a pleasant 52 year old female who is a patient of Dr. Ardell Isaacs.  She presents to our office today with complaints of ongoing LLQ abdominal pain.  Says that it started several months ago, likely back in May, and has been persistent with waxing and waning since that time.  She saw her PCP who diagnosed her with panniculitis and gave her pain medication.  She then went to see her GYN who told her she likely has diverticulitis and treated her with a course of cipro.  She says that the pain got slightly better while on the cipro but was still present and worsened again when she finished the medication.  GYN then referred her to our office.  She denies any change in bowel habits and there is no rectal bleeding.  No nausea, vomiting, or fevers.  No imagining has been performed.  CBC and CMP were unremarkable.  Her last colonoscopy was in 04/2012 at which time she had two polyps removed from the sigmoid colon, moderate diverticulosis in the sigmoid and descending colon, and internal hemorrhoids.  The polyps were hyperplastic and it was recommended that she have a repeat procedure in 10 years from that time.  Just of note, patient had a CT scan in 08/2007, which showed diverticulosis of the sigmoid colon with suspicion of mild diverticulitis, but was not definite.  It stated that there was no free air or abscess and that one could argue that the findings are chronic.    Current Medications, Allergies, Past Medical History, Past Surgical History, Family History and Social History were reviewed in [employer] record.   Physical Exam: BP 128/82  Pulse 80  Ht 5\' 6"  (1.676 m)  Wt 260 lb (117.935 kg)  BMI 41.99 kg/m2  SpO2 98% General: Well developed black female in no acute distress Head: Normocephalic and atraumatic Eyes:  sclerae anicteric, conjunctiva pink  Ears: Normal auditory acuity Lungs:  Clear throughout to auscultation Heart: Regular rate and rhythm Abdomen: Soft, obese, non-distended.  BS present.  LLQ TTP without R/R/G. Musculoskeletal: Symmetrical with no gross deformities  Extremities: No edema  Neurological: Alert oriented x 4, grossly nonfocal Psychological:  Alert and cooperative. Normal mood and affect  Assessment and Recommendations: -LLQ pain:  Possibly diverticulitis, but no resolution of pain with course of cipro.  Will order CT scan abdomen and pelvis with contrast.  Patient does have LLQ TTP but does not look ill.  Will hold off with antibiotics until CT scan is resulted.

## 2013-08-23 NOTE — Patient Instructions (Addendum)
  You have been scheduled for a CT scan of the abdomen and pelvis at [facility] ([address]---this is in the same building as [facility]).   You are scheduled on Thurs 08-24-2013 at 9:00 am . You should arrive at 8:45 am  prior to your appointment time for registration. Please follow the written instructions below on the day of your exam:  WARNING: IF YOU ARE ALLERGIC TO IODINE/X-RAY DYE, PLEASE NOTIFY RADIOLOGY IMMEDIATELY AT [phone]! YOU WILL BE GIVEN A 13 HOUR PREMEDICATION PREP.  1) Do not eat or drink anything after 5:00 am  (4 hours prior to your test) 2) You have been given 2 bottles of oral contrast to drink. The solution may taste better if refrigerated, but do NOT add ice or any other liquid to this solution. Shake well before drinking.    Drink 1 bottle of contrast @ 7:00  am(2 hours prior to your exam)  Drink 1 bottle of contrast @ 8:00  am(1 hour prior to your exam)  You may take any medications as prescribed with a small amount of water except for the following: Metformin, Glucophage, Glucovance, Avandamet, Riomet, Fortamet, Actoplus Met, Janumet, Glumetza or Metaglip. The above medications must be held the day of the exam AND 48 hours after the exam.  The purpose of you drinking the oral contrast is to aid in the visualization of your intestinal tract. The contrast solution may cause some diarrhea. Before your exam is started, you will be given a small amount of fluid to drink. Depending on your individual set of symptoms, you may also receive an intravenous injection of x-ray contrast/dye. Plan on being at [facility] for 30 minutes or long, depending on the type of exam you are having performed.  If you have any questions regarding your exam or if you need to reschedule, you may call the CT department at [phone] between the hours of 8:00 am and 5:00 pm,  Monday-Friday.  ________________________________________________________________________

## 2013-08-23 NOTE — Progress Notes (Signed)
Reviewed and agree with management plan.  Malcolm T. Stark, MD FACG 

## 2013-08-24 NOTE — Progress Notes (Signed)
Sent to me in error. 

## 2013-08-25 ENCOUNTER — Ambulatory Visit (INDEPENDENT_AMBULATORY_CARE_PROVIDER_SITE_OTHER)
Admission: RE | Admit: 2013-08-25 | Discharge: 2013-08-25 | Disposition: A | Discharge status: Home / Self Care | Payer: Managed Care, Other (non HMO) | Source: Ambulatory Visit | Attending: Gastroenterology | Admitting: Gastroenterology

## 2013-08-25 DIAGNOSIS — R1032 Left lower quadrant pain: Secondary | ICD-10-CM

## 2013-08-25 MED ORDER — IOHEXOL 300 MG/ML  SOLN
100.0000 mL | Freq: Once | INTRAMUSCULAR | Status: AC | PRN
  Administered 2013-08-25: 100 mL via INTRAVENOUS

## 2013-08-28 ENCOUNTER — Other Ambulatory Visit: Payer: Self-pay | Admitting: *Deleted

## 2013-08-28 MED ORDER — DICYCLOMINE HCL 10 MG PO CAPS: ORAL_CAPSULE | ORAL | Status: DC

## 2013-08-30 ENCOUNTER — Telehealth: Payer: Self-pay | Admitting: *Deleted

## 2013-08-30 NOTE — Telephone Encounter (Signed)
I called the patient to advise her we got a fax from "[employer]" /[employer] and they were trying to reach the patient.  The fax did not actually say what they wanted.  They had noted they tried to reach the patient but could not reach her on the home phone.  The patient already got her results from [place], nurse here at [facility].  I did verify to the patient that she has an appointment with Dr. Russella Dar on 10-03-2013.

## 2013-08-31 NOTE — Telephone Encounter (Signed)
Spoke with patient on the phone with chief complaint of SOB. She did not sound like she had any breathing problems that I could hear. States that she was was at the hospital with her mother and came home about 2 hours ago. Her Mom was just diagnosed with lung cancer and she is feeling very stressed and thinks her problem is anxiety. Denies any palpitations or chest pain. States that she forgot to take her Cozaar yesterday and today because of all the stress she has been under. I had her check her BP and HR while I stayed on the phone with her and BP was 158/94 with a HR of 86. Advised her to take the Cozaar right now which she states she did. She did take her other medication yesterday. She will go back on her routine with medications tomorrow. She will try to relax and complete some deep breathing exercises. States she felt better after she spoke with me and will follow up with Norma Fredrickson PA next week.

## 2013-08-31 NOTE — Telephone Encounter (Signed)
New problem   Pt sob

## 2013-09-05 ENCOUNTER — Ambulatory Visit (INDEPENDENT_AMBULATORY_CARE_PROVIDER_SITE_OTHER): Payer: Managed Care, Other (non HMO) | Admitting: Nurse Practitioner

## 2013-09-05 ENCOUNTER — Encounter: Payer: Self-pay | Admitting: Nurse Practitioner

## 2013-09-05 VITALS — BP 160/90 | HR 64 | Ht 66.0 in | Wt 257.1 lb

## 2013-09-05 DIAGNOSIS — I10 Essential (primary) hypertension: Secondary | ICD-10-CM

## 2013-09-05 MED ORDER — LOSARTAN POTASSIUM 100 MG PO TABS: 100.0000 mg | ORAL_TABLET | Freq: Every day | ORAL | Status: DC

## 2013-09-05 MED ORDER — AMLODIPINE BESYLATE 10 MG PO TABS: 10.0000 mg | ORAL_TABLET | Freq: Every day | ORAL | Status: DC

## 2013-09-05 NOTE — Patient Instructions (Addendum)
Continue with your current medicines but I am increasing the Norvasc to 10 mg a day  Try to keep working on stopping smoking  Ok to exercise  I will see you in 3 to 4 weeks  Call the [facility] office at [phone] if you have any questions, problems or concerns.

## 2013-09-05 NOTE — Progress Notes (Signed)
Lisa [name] Date of Birth: [birth year] Medical Record #[id number]  History of Present Illness: Lisa [name] is seen back today for a 3 week check. Seen for Dr. Elease Hashimoto. Has HTN, no known CAD and GERD. Normal echo back in 2009. Myoview low risk from 2008.   Recently got back on BP medicine - intolerant to ACE due to cough (which persisted and I suggested some OTC PPI) - however, now on ARB - dose was increased at her last visit. Still smoking but was thinking about stopping.   Comes back today. Here alone. Doing ok. Very stressed - mom recently found to have stage 4 lung cancer. BP up. Missed a few days of medicines. Still smoking some. No swelling.   Current Outpatient Prescriptions  Medication Sig Dispense Refill  . acetaminophen (TYLENOL) 500 MG tablet Take 1,000 mg by mouth every 6 (six) hours as needed. Pain      . amLODipine (NORVASC) 5 MG tablet Take 1 tablet (5 mg total) by mouth at bedtime.  30 tablet  11  . dicyclomine (BENTYL) 10 MG capsule Take one BID for spasm/cramping  60 capsule  0  . HYDROcodone-acetaminophen (NORCO/VICODIN) 5-325 MG per tablet Take 1 tablet by mouth every 4 (four) hours as needed.       Marland Kitchen losartan (COZAAR) 50 MG tablet Take 2 tablets (100 mg total) by mouth daily.  30 tablet  6  . metoprolol succinate (TOPROL-XL) 50 MG 24 hr tablet Take 1 tablet (50 mg total) by mouth at bedtime.  30 tablet  6  . SUMAtriptan (IMITREX) 100 MG tablet Take 1 tablet (100 mg total) by mouth once as needed for migraine. May repeat x 1 after 2 hours; maximum 2 tabs per day and 8 tabs per month  8 tablet  6   Current Facility-Administered Medications  Medication Dose Route Frequency Provider Last Rate Last Dose  . albuterol (PROVENTIL,VENTOLIN) solution continuous neb  2.5 mg Nebulization Once Meryl Dare, MD        Allergies  Allergen Reactions  . Hctz [Hydrochlorothiazide]     Causes a headache  . Lisinopril Cough  . Percocet [Oxycodone-Acetaminophen] Other (See  Comments)    "MAKES ME MEAN"    Past Medical History  Diagnosis Date  . Hypertension   . Chest pain, non-cardiac   . Obesity   . Cigarette smoker   . IBS (irritable bowel syndrome)   . GERD (gastroesophageal reflux disease)   . Diverticulosis   . Pyloric erosion   . Allergic rhinitis   . Sleep apnea     CPAP setting varies from 4-10  . Chronic headaches   . Breast nodule     right breast, to see dr Vincente Poli 06-20-2012 for   . Complication of anesthesia 05-17-2012    trouble breathing after colonscopy, needed nebulizer    Past Surgical History  Procedure Laterality Date  . Cesarean section  2003, 2009    x 2  . Laparoscopy    . Umbilical hernia repair  2004  . US echocardiography  05/08/2008    EF 55-60%  . Cardiovascular stress test  03/25/2007    EF 78%  . Endometrial ablation  09/2010    and D&C  . Cardiac catheterization  12/28/2007    EF 75%. IT REVEALS NORMAL/SUPRANORMAL LEFT VENTRICULAR SYSTOLIC FUNCTION  . Cholecystectomy  1993  . Hernia repair  2004    umbilical  . Ventral hernia repair  06/15/2012    Procedure: LAPAROSCOPIC  VENTRAL HERNIA;  Surgeon: Mariella Saa, MD;  Location: [facility];  Service: General;  Laterality: N/A;  LAPAROSCOPIC REPAIR VENTRAL HERNIA    History  Smoking status  . Current Every Day Smoker -- 0.50 packs/day for 25 years  . Types: Cigarettes  Smokeless tobacco  . Never Used    History  Alcohol Use  . Yes    Comment: very rare    Family History  Problem Relation Age of Onset  . Hypertension Mother   . Diabetes Maternal Grandmother   . Colon cancer Neg Hx     Review of Systems: The review of systems is per the HPI.  All other systems were reviewed and are negative.  Physical Exam: BP 160/90  Pulse 64  Ht 5\' 6"  (1.676 m)  Wt 257 lb 1.9 oz (116.629 kg)  BMI 41.52 kg/m2 Patient is very pleasant and in no acute distress. Weight down a few pounds. BP by me is 160/90. Skin is warm and dry. Color is normal.  HEENT is  unremarkable. Normocephalic/atraumatic. PERRL. Sclera are nonicteric. Neck is supple. No masses. No JVD. Lungs are clear. Cardiac exam shows a regular rate and rhythm. Abdomen is soft. Extremities are without edema. Gait and ROM are intact. No gross neurologic deficits noted.  LABORATORY DATA:   Chemistry      Component Value Date/Time   NA 137 08/15/2013 1241   K 3.5 08/15/2013 1241   CL 105 08/15/2013 1241   CO2 28 08/15/2013 1241   BUN 12 08/15/2013 1241   CREATININE 0.9 08/15/2013 1241      Component Value Date/Time   CALCIUM 9.0 08/15/2013 1241   ALKPHOS 74 08/01/2013 0910   AST 12 08/01/2013 0910   ALT 20 08/01/2013 0910   BILITOT 0.5 08/01/2013 0910     Lab Results  Component Value Date   WBC 9.0 08/01/2013   HGB 15.8* 08/01/2013   HCT 46.5* 08/01/2013   MCV 90.2 08/01/2013   PLT 251.0 08/01/2013   Lab Results  Component Value Date   CHOL 189 08/01/2013   HDL 34.80* 08/01/2013   LDLCALC 133* 08/01/2013   TRIG 106.0 08/01/2013   CHOLHDL 5 08/01/2013    Assessment / Plan:  1. HTN - BP still not controlled - increasing Norvasc to 10 mg a day - hopefully she will not have swelling - may have to add Hydralazine. I will see her back in about 3 to 4 weeks.   2. Tobacco abuse - still thinking about stopping. Cessation encouraged.   3. Situational stress - discussed in depth today.   Patient is agreeable to this plan and will call if any problems develop in the interim.   Rosalio Macadamia, RN, ANP-C [facility] [address] [place], [place]  [address]

## 2013-09-07 ENCOUNTER — Other Ambulatory Visit: Payer: Self-pay

## 2013-09-07 MED ORDER — METOPROLOL SUCCINATE ER 50 MG PO TB24: 50.0000 mg | ORAL_TABLET | Freq: Every day | ORAL | Status: DC

## 2013-09-07 MED ORDER — AMLODIPINE BESYLATE 10 MG PO TABS: 10.0000 mg | ORAL_TABLET | Freq: Every day | ORAL | Status: DC

## 2013-09-26 ENCOUNTER — Ambulatory Visit (INDEPENDENT_AMBULATORY_CARE_PROVIDER_SITE_OTHER): Payer: Managed Care, Other (non HMO) | Admitting: Nurse Practitioner

## 2013-09-26 ENCOUNTER — Encounter: Payer: Self-pay | Admitting: Nurse Practitioner

## 2013-09-26 VITALS — BP 160/80 | HR 74 | Ht 66.0 in | Wt 256.4 lb

## 2013-09-26 DIAGNOSIS — I10 Essential (primary) hypertension: Secondary | ICD-10-CM

## 2013-09-26 NOTE — Patient Instructions (Signed)
Stay on your current medicines  Keep working on your diet/weight/exercise/smoking cessation  I will see you in 4 months  Call the [facility] office at [phone] if you have any questions, problems or concerns.

## 2013-09-26 NOTE — Progress Notes (Signed)
Lisa [name] Date of Birth: July 11, 1961 Medical Record #[id number]  History of Present Illness: Lisa [name] is seen back today for a 3 week check. Seen for Dr. Elease Hashimoto. Has HTN, no known CAD and GERD. Normal echo back in 2009. Myoview low risk from 2008.   Recently got back on BP medicine - intolerant to ACE due to cough (which persisted and I suggested some OTC PPI) - however, now on ARB - dose was increased at her last visit with me. Still smoking but was thinking about stopping.   Seen 3 weeks ago.  Very stressed - mom recently found to have stage 4 lung cancer. BP was up. She had missed a few days of medicines. Still smoking some. Norvasc was increased at her last visit with me.   Comes back today. Just took her medicines prior to coming here today. Has seen her PCP for some cough, congestion, hoarseness and chest pain - was worried about possible lung cancer given her mom's recent diagnosis. Continues to smoke. BP was ok at her PCP's. Tolerating her medicines. Admits that she needs to stay on track with her diet/exercise/weight loss plans.  Current Outpatient Prescriptions  Medication Sig Dispense Refill  . acetaminophen (TYLENOL) 500 MG tablet Take 1,000 mg by mouth every 6 (six) hours as needed. Pain      . amLODipine (NORVASC) 10 MG tablet Take 1 tablet (10 mg total) by mouth daily.  90 tablet  3  . dicyclomine (BENTYL) 10 MG capsule Take one BID for spasm/cramping  60 capsule  0  . HYDROcodone-acetaminophen (NORCO/VICODIN) 5-325 MG per tablet Take 1 tablet by mouth every 4 (four) hours as needed.       Marland Kitchen losartan (COZAAR) 100 MG tablet Take 1 tablet (100 mg total) by mouth daily.  90 tablet  3  . metoprolol succinate (TOPROL-XL) 50 MG 24 hr tablet Take 1 tablet (50 mg total) by mouth at bedtime.  90 tablet  3   Current Facility-Administered Medications  Medication Dose Route Frequency Provider Last Rate Last Dose  . albuterol (PROVENTIL,VENTOLIN) solution continuous neb  2.5 mg  Nebulization Once Meryl Dare, MD        Allergies  Allergen Reactions  . Hctz [Hydrochlorothiazide]     Causes a headache  . Lisinopril Cough  . Percocet [Oxycodone-Acetaminophen] Other (See Comments)    "MAKES ME MEAN"    Past Medical History  Diagnosis Date  . Hypertension   . Chest pain, non-cardiac   . Obesity   . Cigarette smoker   . IBS (irritable bowel syndrome)   . GERD (gastroesophageal reflux disease)   . Diverticulosis   . Pyloric erosion   . Allergic rhinitis   . Sleep apnea     CPAP setting varies from 4-10  . Chronic headaches   . Breast nodule     right breast, to see dr Vincente Poli 06-20-2012 for   . Complication of anesthesia 05-17-2012    trouble breathing after colonscopy, needed nebulizer    Past Surgical History  Procedure Laterality Date  . Cesarean section  2003, 2009    x 2  . Laparoscopy    . Umbilical hernia repair  2004  . US echocardiography  05/08/2008    EF 55-60%  . Cardiovascular stress test  03/25/2007    EF 78%  . Endometrial ablation  09/2010    and D&C  . Cardiac catheterization  12/28/2007    EF 75%. IT REVEALS NORMAL/SUPRANORMAL LEFT VENTRICULAR SYSTOLIC FUNCTION  .  Cholecystectomy  1993  . Hernia repair  2004    umbilical  . Ventral hernia repair  06/15/2012    Procedure: LAPAROSCOPIC VENTRAL HERNIA;  Surgeon: Mariella Saa, MD;  Location: [facility];  Service: General;  Laterality: N/A;  LAPAROSCOPIC REPAIR VENTRAL HERNIA    History  Smoking status  . Current Every Day Smoker -- 0.50 packs/day for 25 years  . Types: Cigarettes  Smokeless tobacco  . Never Used    History  Alcohol Use  . Yes    Comment: very rare    Family History  Problem Relation Age of Onset  . Hypertension Mother   . Diabetes Maternal Grandmother   . Colon cancer Neg Hx     Review of Systems: The review of systems is per the HPI.  All other systems were reviewed and are negative.  Physical Exam: BP 160/80  Pulse 74  Ht 5\' 6"  (1.676 m)   Wt 256 lb 6.4 oz (116.302 kg)  BMI 41.4 kg/m2  SpO2 93% Patient is very pleasant and in no acute distress. Morbidly obese. Skin is warm and dry. Color is normal.  HEENT is unremarkable. Normocephalic/atraumatic. PERRL. Sclera are nonicteric. Neck is supple. No masses. No JVD. Lungs are clear. Cardiac exam shows a regular rate and rhythm. Abdomen is soft. Extremities are without edema. Gait and ROM are intact. No gross neurologic deficits noted.  LABORATORY DATA:   Chemistry      Component Value Date/Time   NA 137 08/15/2013 1241   K 3.5 08/15/2013 1241   CL 105 08/15/2013 1241   CO2 28 08/15/2013 1241   BUN 12 08/15/2013 1241   CREATININE 0.9 08/15/2013 1241      Component Value Date/Time   CALCIUM 9.0 08/15/2013 1241   ALKPHOS 74 08/01/2013 0910   AST 12 08/01/2013 0910   ALT 20 08/01/2013 0910   BILITOT 0.5 08/01/2013 0910     Lab Results  Component Value Date   WBC 9.0 08/01/2013   HGB 15.8* 08/01/2013   HCT 46.5* 08/01/2013   MCV 90.2 08/01/2013   PLT 251.0 08/01/2013    Lab Results  Component Value Date   CHOL 189 08/01/2013   HDL 34.80* 08/01/2013   LDLCALC 133* 08/01/2013   TRIG 106.0 08/01/2013   CHOLHDL 5 08/01/2013    Assessment / Plan: 1. HTN - BP by me is 140/80. I have left her on her current regimen. See her back in 4 months. She knows what she needs to do in regards to her health - just needs to stay on track.   2. Morbid obesity - discussed in depth again today  3. Situational stress   4. Tobacco abuse - cessation encouraged - has tried to call the classes thru [facility] - but no availability until the first of the New Year.   Patient is agreeable to this plan and will call if any problems develop in the interim.   Rosalio Macadamia, RN, ANP-C [facility] [address] [place], [place]  [address]

## 2013-10-03 ENCOUNTER — Ambulatory Visit (INDEPENDENT_AMBULATORY_CARE_PROVIDER_SITE_OTHER): Payer: Managed Care, Other (non HMO) | Admitting: Gastroenterology

## 2013-10-03 ENCOUNTER — Encounter: Payer: Self-pay | Admitting: Gastroenterology

## 2013-10-03 VITALS — BP 138/80 | HR 80 | Ht 66.0 in | Wt 260.2 lb

## 2013-10-03 DIAGNOSIS — K5904 Chronic idiopathic constipation: Secondary | ICD-10-CM

## 2013-10-03 DIAGNOSIS — K5909 Other constipation: Secondary | ICD-10-CM

## 2013-10-03 DIAGNOSIS — K5730 Diverticulosis of large intestine without perforation or abscess without bleeding: Secondary | ICD-10-CM

## 2013-10-03 DIAGNOSIS — R109 Unspecified abdominal pain: Secondary | ICD-10-CM

## 2013-10-03 NOTE — Patient Instructions (Addendum)
You have been given a separate informational sheet regarding your tobacco use, the importance of quitting and local resources to help you quit.  Please use your Bentyl four times a day as needed for abdominal cramping, abdominal bloating, and abdominal pain.  Thank you for choosing me and [facility].  Venita Lick. Pleas Koch., MD., [name]   cc: Aleatha Borer, MD

## 2013-10-03 NOTE — Progress Notes (Signed)
    History of Present Illness: This is a 52 year old female seen for LLQ abdominal pain. Recent abdominal/pelvic CT scan showed diverticulosis and a 2 cm hepatic hemangioma. She underwent upper endoscopy and colonoscopy in June 2013 showing diverticulosis, hemorrhoids and small hyperplastic colon polyps. She has episodes of crampy left lower quadrant pain, occasionally right lower quadrant and suprapubic pain. Symptoms are partially responsive to Bentyl that she's used very sparingly. She notes constipation since Friday which is unusual for her and this has been associated with crampy pain across her lower abdomen. She is very concerned about cancer.  Current Medications, Allergies, Past Medical History, Past Surgical History, Family History and Social History were reviewed in [employer] record.  Physical Exam: General: Well developed , well nourished, overweight, no acute distress Head: Normocephalic and atraumatic Eyes:  sclerae anicteric, EOMI Ears: Normal auditory acuity Mouth: No deformity or lesions Lungs: Clear throughout to auscultation Heart: Regular rate and rhythm; no murmurs, rubs or bruits Abdomen: Soft, non tender and non distended. No masses, hepatosplenomegaly or hernias noted. Normal Bowel sounds Musculoskeletal: Symmetrical with no gross deformities  Pulses:  Normal pulses noted Extremities: No clubbing, cyanosis, edema or deformities noted Neurological: Alert oriented x 4, grossly nonfocal Psychological:  Alert and cooperative. Normal mood and affect  Assessment and Recommendations:  1. LLQ pain and pain across lower abdomen. I suspect she has irritable bowel syndrome or functional pain. She may have musculoskeletal pain as well. She was reassured that she has had a thorough GI evaluation to exclude any gastrointestinal cancer or other serious GI disorder. I encouraged her to use dicyclomine 10 mg 4 times a day as needed for abdominal pain, abdominal  cramping, abdominal bloating. If her symptoms do not respond begin a trial of probiotics. She will contact us if her symptoms are not adequately controlled.  2. Hepatic hemangioma, asymptomatic.   3. Diverticulosis and mild constipation. Long-term high fiber diet with adequate daily water intake. Mild over-the-counter laxatives as needed.

## 2013-10-05 ENCOUNTER — Other Ambulatory Visit: Payer: Self-pay

## 2013-10-30 ENCOUNTER — Ambulatory Visit: Payer: Managed Care, Other (non HMO) | Admitting: Nurse Practitioner

## 2013-10-31 NOTE — Progress Notes (Signed)
Patient no showed for appointment

## 2014-01-26 ENCOUNTER — Ambulatory Visit: Payer: Managed Care, Other (non HMO) | Admitting: Nurse Practitioner

## 2014-02-13 ENCOUNTER — Ambulatory Visit
Admission: RE | Admit: 2014-02-13 | Discharge: 2014-02-13 | Disposition: A | Discharge status: Home / Self Care | Payer: Managed Care, Other (non HMO) | Source: Ambulatory Visit | Attending: Nurse Practitioner | Admitting: Nurse Practitioner

## 2014-02-13 ENCOUNTER — Encounter: Payer: Self-pay | Admitting: Nurse Practitioner

## 2014-02-13 ENCOUNTER — Ambulatory Visit (INDEPENDENT_AMBULATORY_CARE_PROVIDER_SITE_OTHER): Payer: Managed Care, Other (non HMO) | Admitting: Nurse Practitioner

## 2014-02-13 VITALS — BP 150/80 | HR 74 | Ht 65.75 in | Wt 271.0 lb

## 2014-02-13 DIAGNOSIS — I10 Essential (primary) hypertension: Secondary | ICD-10-CM

## 2014-02-13 DIAGNOSIS — R079 Chest pain, unspecified: Secondary | ICD-10-CM

## 2014-02-13 DIAGNOSIS — R0600 Dyspnea, unspecified: Secondary | ICD-10-CM

## 2014-02-13 DIAGNOSIS — R0989 Other specified symptoms and signs involving the circulatory and respiratory systems: Secondary | ICD-10-CM

## 2014-02-13 DIAGNOSIS — R0609 Other forms of dyspnea: Secondary | ICD-10-CM

## 2014-02-13 LAB — HEMOGLOBIN A1C: Hgb A1c MFr Bld: 6.2 % (ref 4.6–6.5)

## 2014-02-13 LAB — BASIC METABOLIC PANEL
BUN: 15 mg/dL (ref 6–23)
CO2: 27 mEq/L (ref 19–32)
Calcium: 9.4 mg/dL (ref 8.4–10.5)
Chloride: 105 mEq/L (ref 96–112)
Creatinine, Ser: 0.9 mg/dL (ref 0.4–1.2)
GFR: 86.19 mL/min (ref >60.00)
Glucose, Bld: 120 mg/dL — ABNORMAL HIGH (ref 70–99)
Potassium: 4.4 mEq/L (ref 3.5–5.1)
Sodium: 139 mEq/L (ref 135–145)

## 2014-02-13 LAB — TSH: TSH: 1.7 u[IU]/mL (ref 0.35–5.50)

## 2014-02-13 LAB — BRAIN NATRIURETIC PEPTIDE: Pro B Natriuretic peptide (BNP): 9 pg/mL (ref 0.0–100.0)

## 2014-02-13 LAB — CBC
HCT: 45.5 % (ref 36.0–46.0)
Hemoglobin: 15.2 g/dL — ABNORMAL HIGH (ref 12.0–15.0)
MCHC: 33.4 g/dL (ref 30.0–36.0)
MCV: 90.9 fl (ref 78.0–100.0)
Platelets: 311 10*3/uL (ref 150.0–400.0)
RBC: 5 Mil/uL (ref 3.87–5.11)
RDW: 13.9 % (ref 11.5–14.6)
WBC: 7.4 10*3/uL (ref 4.5–10.5)

## 2014-02-13 LAB — TROPONIN I: Troponin I: <0.3 ng/mL (ref <0.30)

## 2014-02-13 NOTE — Progress Notes (Signed)
Lisa [name] Date of Birth: 17-Dec-1960 Medical Record #[id number]  History of Present Illness: [name] is seen back today for a follow up visit. Seen for Dr. Acie Fredrickson. She has HTN, no known CAD and GERD. Normal echo back in 2009 and low risk Myoview in 2008.   Seen back in the fall trying to get her BP controlled. Lots of stress with mom having stage 4 lung cancer. Brynlei continued to smoke. Has had lots of difficulties staying on track with her health.   Comes back today. Here alone. Crying - just about hysterical. Says she knows "something is wrong". Lots of stress. Taking care of everyone but herself. Not exercising. Says she is not eating bad. More swelling in her legs. Chest pain for the past couple of weeks - moves around the left chest - sometimes in her left arm. Feels better if she mashes on her chest. More short of breath. Worried that she has diabetes. Worried that she has COPD. Worried that she is going to die. Worried about being misdiagnosed. Still smoking - went to the Smoking Cessation classes but did not follow thru with getting the nicotine patches. Extreme fatigue. Heart pounding. Says her BP has been ok.   Current Outpatient Prescriptions  Medication Sig Dispense Refill  . acetaminophen (TYLENOL) 500 MG tablet Take 1,000 mg by mouth every 6 (six) hours as needed. Pain      . amLODipine (NORVASC) 10 MG tablet Take 1 tablet (10 mg total) by mouth daily.  90 tablet  3  . dicyclomine (BENTYL) 10 MG capsule Take one BID for spasm/cramping  60 capsule  0  . losartan (COZAAR) 100 MG tablet Take 1 tablet (100 mg total) by mouth daily.  90 tablet  3  . metoprolol succinate (TOPROL-XL) 50 MG 24 hr tablet Take 1 tablet (50 mg total) by mouth at bedtime.  90 tablet  3  . omeprazole (PRILOSEC) 20 MG capsule Take 20 mg by mouth.       Current Facility-Administered Medications  Medication Dose Route Frequency Provider Last Rate Last Dose  . albuterol (PROVENTIL,VENTOLIN) solution  continuous neb  2.5 mg Nebulization Once Ladene Artist, MD        Allergies  Allergen Reactions  . Hctz [Hydrochlorothiazide]     Causes a headache  . Lisinopril Cough  . Morphine And Related     Moodiness.  Causes anger.  . Other     guaifensen             Restlessness, jittery  . Percocet [Oxycodone-Acetaminophen] Other (See Comments)    "MAKES ME MEAN"    Past Medical History  Diagnosis Date  . Hypertension   . Chest pain, non-cardiac   . Obesity   . Cigarette smoker   . IBS (irritable bowel syndrome)   . GERD (gastroesophageal reflux disease)   . Diverticulosis   . Pyloric erosion   . Allergic rhinitis   . Sleep apnea     CPAP setting varies from 4-10  . Chronic headaches   . Breast nodule     right breast, to see dr Helane Rima 06-20-2012 for   . Complication of anesthesia 05-17-2012    trouble breathing after colonscopy, needed nebulizer    Past Surgical History  Procedure Laterality Date  . Cesarean section  2003, 2009    x 2  . Laparoscopy    . Umbilical hernia repair  2004  . US echocardiography  05/08/2008    EF 55-60%  .  Cardiovascular stress test  03/25/2007    EF 78%  . Endometrial ablation  09/2010    and D&C  . Cardiac catheterization  12/28/2007    EF 75%. IT REVEALS NORMAL/SUPRANORMAL LEFT VENTRICULAR SYSTOLIC FUNCTION  . Cholecystectomy  1993  . Hernia repair  [id number]    umbilical  . Ventral hernia repair  06/15/2012    Procedure: LAPAROSCOPIC VENTRAL HERNIA;  Surgeon: Edward Jolly, MD;  Location: [facility];  Service: General;  Laterality: N/A;  LAPAROSCOPIC REPAIR VENTRAL HERNIA    History  Smoking status  . Current Every Day Smoker -- 0.50 packs/day for 25 years  . Types: Cigarettes  Smokeless tobacco  . Never Used    History  Alcohol Use  . Yes    Comment: very rare    Family History  Problem Relation Age of Onset  . Hypertension Mother   . Diabetes Maternal Grandmother   . Colon cancer Neg Hx     Review of Systems: The review  of systems is per the HPI.  All other systems were reviewed and are negative.  Physical Exam: BP 150/80  Pulse 74  Ht 5' 5.75" (1.67 m)  Wt 271 lb (122.925 kg)  BMI 44.08 kg/m2 Patient is crying - almost to the point of being hysterical - but in no acute distress. Weight is up 11 pounds since last visit. She is obese. Skin is warm and dry. Color is normal.  HEENT is unremarkable. Normocephalic/atraumatic. PERRL. Sclera are nonicteric. Neck is supple. No masses. No JVD. Lungs are clear. Cardiac exam shows a regular rate and rhythm. Abdomen is obese but soft. Extremities are with edema right greater than left. Gait and ROM are intact. No gross neurologic deficits noted.  Wt Readings from Last 3 Encounters:  02/13/14 271 lb (122.925 kg)  10/03/13 260 lb 3.2 oz (118.026 kg)  09/26/13 256 lb 6.4 oz (116.302 kg)    LABORATORY DATA: EKG today shows sinus - septal Qs and T wave changes - noted on past EKG as well.  Lab Results  Component Value Date   WBC 9.0 08/01/2013   HGB 15.8* 08/01/2013   HCT 46.5* 08/01/2013   PLT 251.0 08/01/2013   GLUCOSE 90 08/15/2013   CHOL 189 08/01/2013   TRIG 106.0 08/01/2013   HDL 34.80* 08/01/2013   LDLCALC 133* 08/01/2013   ALT 20 08/01/2013   AST 12 08/01/2013   NA 137 08/15/2013   K 3.5 08/15/2013   CL 105 08/15/2013   CREATININE 0.9 08/15/2013   BUN 12 08/15/2013   CO2 28 08/15/2013   TSH 1.795 Test methodology is 3rd generation TSH 12/27/2007   INR 0.9 12/27/2007     Assessment / Plan:  1. HTN - BP not at goal but reports better readings at home - may need to cut her Norvasc back and readjust her medicines. Needs evaluation of her other complaints first and then will bring back for discussion.   2. Morbid obesity - still the crux of her issue.   3. Tobacco abuse - still smoking. Will send for CXR and PFTs. Cessation still encouraged.   4. Chest pain - chronically abnormal EKG - update her Myoview.   Needs labs today. Needs Myoview. Needs CXR and PFTs. Will have her  see Dr.Nahser back for discussion and possible change in medicines. I was quite frank with her in trying to stress the importance of taking care of herself and modifying her risk factors. This seemed to only fuel her  anxieties.   Patient is agreeable to this plan and will call if any problems develop in the interim.   Burtis Junes, RN, [place] [address] [place], [place]  [address] [phone]

## 2014-02-13 NOTE — Patient Instructions (Signed)
We will check labs today  Please go to [facility] to [place] on the first floor for a chest Xray - you may walk in.   We will get a breathing test (PFTs with diffusion) at [facility]  We need to get a stress test (Lexiscan)  See Dr. Acie Fredrickson back for discussion - possible change in medicines  Call the [place] office at [phone] if you have any questions, problems or concerns.

## 2014-02-14 ENCOUNTER — Ambulatory Visit (HOSPITAL_COMMUNITY)
Admission: RE | Admit: 2014-02-14 | Discharge: 2014-02-14 | Disposition: A | Discharge status: Home / Self Care | Payer: Managed Care, Other (non HMO) | Source: Ambulatory Visit | Attending: Nurse Practitioner | Admitting: Nurse Practitioner

## 2014-02-14 DIAGNOSIS — R0600 Dyspnea, unspecified: Secondary | ICD-10-CM

## 2014-02-14 DIAGNOSIS — R0989 Other specified symptoms and signs involving the circulatory and respiratory systems: Principal | ICD-10-CM | POA: Insufficient documentation

## 2014-02-14 DIAGNOSIS — R079 Chest pain, unspecified: Secondary | ICD-10-CM

## 2014-02-14 DIAGNOSIS — I10 Essential (primary) hypertension: Secondary | ICD-10-CM

## 2014-02-14 DIAGNOSIS — R0609 Other forms of dyspnea: Secondary | ICD-10-CM | POA: Insufficient documentation

## 2014-02-14 LAB — PULMONARY FUNCTION TEST
DL/VA % pred: 99 %
DL/VA: 5.08 ml/min/mmHg/L
DLCO cor % pred: 55 %
DLCO cor: 15.24 ml/min/mmHg
DLCO unc % pred: 55 %
DLCO unc: 15.24 ml/min/mmHg
FEF 25-75 Post: 4.08 L/sec
FEF 25-75 Pre: 3.31 L/sec
FEF2575-%Change-Post: 23 %
FEF2575-%Pred-Post: 155 %
FEF2575-%Pred-Pre: 126 %
FEV1-%Change-Post: 0 %
FEV1-%Pred-Post: 73 %
FEV1-%Pred-Pre: 73 %
FEV1-Post: 1.86 L
FEV1-Pre: 1.88 L
FEV1FVC-%Change-Post: 0 %
FEV1FVC-%Pred-Pre: 115 %
FEV6-%Change-Post: 0 %
FEV6-%Pred-Post: 64 %
FEV6-%Pred-Pre: 65 %
FEV6-Post: 2 L
FEV6-Pre: 2.01 L
FEV6FVC-%Pred-Post: 103 %
FEV6FVC-%Pred-Pre: 103 %
FVC-%Change-Post: 0 %
FVC-%Pred-Post: 63 %
FVC-%Pred-Pre: 63 %
FVC-Post: 2 L
FVC-Pre: 2.01 L
Post FEV1/FVC ratio: 93 %
Post FEV6/FVC ratio: 100 %
Pre FEV1/FVC ratio: 94 %
Pre FEV6/FVC Ratio: 100 %
RV % pred: 41 %
RV: 0.79 L
TLC % pred: 56 %
TLC: 3.06 L

## 2014-02-14 MED ORDER — ALBUTEROL SULFATE (2.5 MG/3ML) 0.083% IN NEBU
2.5000 mg | INHALATION_SOLUTION | Freq: Once | RESPIRATORY_TRACT | Status: AC
  Administered 2014-02-14: 2.5 mg via RESPIRATORY_TRACT

## 2014-02-16 ENCOUNTER — Telehealth: Payer: Self-pay | Admitting: *Deleted

## 2014-02-16 DIAGNOSIS — J984 Other disorders of lung: Secondary | ICD-10-CM

## 2014-02-16 NOTE — Telephone Encounter (Signed)
Advised patient and will arrange pulmonary consult

## 2014-02-16 NOTE — Telephone Encounter (Signed)
Appointment April 9 at 3:00 with Dr Lamonte Sakai, advised patient

## 2014-02-16 NOTE — Telephone Encounter (Signed)
°  Patient is returning your call, please call back.  °

## 2014-02-16 NOTE — Telephone Encounter (Signed)
Left message to call back  

## 2014-02-16 NOTE — Telephone Encounter (Signed)
Message copied by Earvin Hansen on Fri Feb 16, 2014  1:08 PM ------      Message from: Burtis Junes      Created: Fri Feb 16, 2014  8:18 AM       Please call. Will need pulmonary referral for restrictive lung disease - for Scherry Ran, [place] please ------

## 2014-02-28 ENCOUNTER — Encounter (HOSPITAL_COMMUNITY): Payer: Managed Care, Other (non HMO) | Attending: Nurse Practitioner | Admitting: Radiology

## 2014-02-28 VITALS — BP 126/71 | HR 75 | Ht 66.0 in | Wt 267.0 lb

## 2014-02-28 DIAGNOSIS — R0609 Other forms of dyspnea: Secondary | ICD-10-CM | POA: Insufficient documentation

## 2014-02-28 DIAGNOSIS — R0989 Other specified symptoms and signs involving the circulatory and respiratory systems: Secondary | ICD-10-CM | POA: Insufficient documentation

## 2014-02-28 DIAGNOSIS — R0600 Dyspnea, unspecified: Secondary | ICD-10-CM

## 2014-02-28 DIAGNOSIS — R0602 Shortness of breath: Secondary | ICD-10-CM

## 2014-02-28 DIAGNOSIS — R079 Chest pain, unspecified: Secondary | ICD-10-CM

## 2014-02-28 DIAGNOSIS — I10 Essential (primary) hypertension: Secondary | ICD-10-CM | POA: Insufficient documentation

## 2014-02-28 MED ORDER — TECHNETIUM TC 99M SESTAMIBI GENERIC - CARDIOLITE
30.0000 | Freq: Once | INTRAVENOUS | Status: AC | PRN
  Administered 2014-02-28: 30 via INTRAVENOUS

## 2014-02-28 MED ORDER — REGADENOSON 0.4 MG/5ML IV SOLN
0.4000 mg | Freq: Once | INTRAVENOUS | Status: AC
  Administered 2014-02-28: 0.4 mg via INTRAVENOUS

## 2014-02-28 NOTE — Progress Notes (Signed)
[facility] [place], [place] [address] [phone]    Cardiology Nuclear Med Study  Lisa [name] is a 53 y.o. female     MRN : [id number]     DOB: January 26, 1961  Procedure Date: 02/28/2014  Nuclear Med Background Indication for Stress Test:  Evaluation for Ischemia History:  No known history of CAD; 2009- Normal Cath.; 2008- MPS- Mild Ant. Thinning, no ischemia. EF= 78%; 2009- Echo- EF= 55-60% Cardiac Risk Factors: Hypertension and Smoker  Symptoms:  Chest Pain, SOB, Palpitations   Nuclear Pre-Procedure Caffeine/Decaff Intake:  None NPO After: 12:00am   Lungs:  clear O2 Sat: 98% on room air. IV 0.9% NS with Angio Cath:  22g  IV Site: R Wrist  IV Started by:  Annye Rusk, CNMT  Chest Size (in):  44 Cup Size: DD  Height: 5\' 6"  (1.676 m)  Weight:  267 lb (121.11 kg)  BMI:  Body mass index is 43.12 kg/(m^2). Tech Comments:  Toprol held X Human resources officer Med Study 1 or 2 day study: 2 day  Stress Test Type:  Lexiscan  Reading MD: N/A  Order Authorizing Provider:  Natale Lay  Resting Radionuclide: Technetium 25m Sestamibi  Resting Radionuclide Dose: 33.0 mCi on 03/06/14   Stress Radionuclide:  Technetium 3m Sestamibi  Stress Radionuclide Dose: 33.0 mCi on 02/28/14           Stress Protocol Rest HR: 75 Stress HR: 93  Rest BP: 126/71 Stress BP: 139/52  Exercise Time (min): n/a METS: n/a           Dose of Adenosine (mg):  n/a Dose of Lexiscan: 0.4 mg  Dose of Atropine (mg): n/a Dose of Dobutamine: n/a mcg/kg/min (at max HR)  Stress Test Technologist: Glade Lloyd, BS-ES  Nuclear Technologist:  Charlton Amor, CNMT     Rest Procedure:  Myocardial perfusion imaging was performed at rest 45 minutes following the intravenous administration of Technetium 33m Sestamibi. Rest ECG: NSR - Normal EKG  Stress Procedure:  The patient received IV Lexiscan 0.4 mg over 15-seconds.  Technetium 24m Sestamibi injected at 30-seconds.   Quantitative spect images were obtained after a 45 minute delay.  During the infusion of Lexiscan, the patient complained of SOB, ears feeling hot, tongue feels funny, chest discomfort and headache.  These symptoms began to resolve in recovery with the exception of the headache.  Stress ECG: No significant change from baseline ECG  QPS Raw Data Images:  Normal; no motion artifact; normal heart/lung ratio. Stress Images:  Thre is decreased uptake in the distal anterior wall and in the true apex. Rest Images:  Normal homogeneous uptake in all areas of the myocardium. Subtraction (SDS):  A small area moderate severity reversible defect in the apical anterior and true apex.  Transient Ischemic Dilatation (Normal <1.22):  0.86 Lung/Heart Ratio (Normal <0.45):  0.32  Quantitative Gated Spect Images QGS EDV:  113 ml QGS ESV:  30 ml  Impression Exercise Capacity:  Lexiscan with no exercise. BP Response:  Normal blood pressure response. Clinical Symptoms:  Mild chest pain/dyspnea. ECG Impression:  No significant ST segment change suggestive of ischemia. Comparison with Prior Nuclear Study: No images to compare  Overall Impression:  Intermediate risk stress nuclear study with a msll reversible defect in the distal LAD (SDS 3). .  LV Ejection Fraction: 73%.  LV Wall Motion:  NL LV Function; NL Wall Motion  Dorothy Spark 03/06/2014

## 2014-03-06 ENCOUNTER — Encounter: Payer: Self-pay | Admitting: Interventional Cardiology

## 2014-03-06 ENCOUNTER — Encounter: Payer: Self-pay | Admitting: Cardiovascular Disease

## 2014-03-06 ENCOUNTER — Ambulatory Visit (HOSPITAL_COMMUNITY): Payer: Managed Care, Other (non HMO) | Attending: Cardiovascular Disease

## 2014-03-06 DIAGNOSIS — R0989 Other specified symptoms and signs involving the circulatory and respiratory systems: Secondary | ICD-10-CM

## 2014-03-06 MED ORDER — TECHNETIUM TC 99M SESTAMIBI GENERIC - CARDIOLITE
33.0000 | Freq: Once | INTRAVENOUS | Status: AC | PRN
  Administered 2014-03-06: 33 via INTRAVENOUS

## 2014-03-07 ENCOUNTER — Telehealth: Payer: Self-pay | Admitting: *Deleted

## 2014-03-07 ENCOUNTER — Other Ambulatory Visit: Payer: Self-pay | Admitting: *Deleted

## 2014-03-07 MED ORDER — ASPIRIN EC 81 MG PO TBEC: 81.0000 mg | DELAYED_RELEASE_TABLET | Freq: Every day | ORAL | Status: DC

## 2014-03-07 MED ORDER — ISOSORBIDE MONONITRATE ER 30 MG PO TB24: 30.0000 mg | ORAL_TABLET | Freq: Every day | ORAL | Status: DC

## 2014-03-07 NOTE — Telephone Encounter (Signed)
S/w pt aware of stress test results and agreeable to plan will start imdur (30 mg) daily with husband gets paid and start and pick up ASA ( 81 mg) daily at the office.

## 2014-03-08 ENCOUNTER — Encounter: Payer: Self-pay | Admitting: Emergency Medicine

## 2014-03-08 ENCOUNTER — Ambulatory Visit (INDEPENDENT_AMBULATORY_CARE_PROVIDER_SITE_OTHER): Payer: Managed Care, Other (non HMO) | Admitting: Emergency Medicine

## 2014-03-08 VITALS — BP 142/80 | HR 75 | Temp 97.3°F | Ht 66.0 in | Wt 273.0 lb

## 2014-03-08 DIAGNOSIS — R0989 Other specified symptoms and signs involving the circulatory and respiratory systems: Secondary | ICD-10-CM

## 2014-03-08 DIAGNOSIS — R0609 Other forms of dyspnea: Secondary | ICD-10-CM

## 2014-03-08 DIAGNOSIS — G4730 Sleep apnea, unspecified: Secondary | ICD-10-CM

## 2014-03-08 NOTE — Patient Instructions (Addendum)
Continue your CPAP every night. Discuss having a home pressure titration with Dr Brett Fairy.  Your breathing tests do not look like significant COPD Follow with Dr Acie Fredrickson regarding your cardiac testing AFTER you have been cleared by Dr Acie Fredrickson, we need to increase your exercise and start working on your conditioning.  Please continue to work on stopping smoking.  Follow with Dr Lamonte Sakai in 3 months or sooner if you have any problems.

## 2014-03-08 NOTE — Progress Notes (Signed)
Subjective:    Patient ID: Lisa [name], female    DOB: Aug 01, 1961, 53 y.o.   MRN: [id number]  HPI 53 yo smoker (30-40 pk-yrs) with OSA (on CPAP), allergies, HTN, non-cardiac CP, GERD and IBS. Followed and referred by L. Gerhardt with cardiology.  She has been seen for dyspnea. Chemical myocardial perfusion scan 4/7 > small reversible defer distal LAD distribution.  CXR 3/17 normal.  She is referred here to further eval her SOB. She has exertional muscle fatigue. No wheeze. Some cough, bothers her after a URI or with allergies. Also has GERD that isn't reliably treated. She used to be on lisinopril but changed to losartan. She had PFT 3/'15 that show primarily restriction.    Review of Systems  Constitutional: Negative for fever and unexpected weight change.  HENT: Positive for congestion and rhinorrhea. Negative for dental problem, ear pain, nosebleeds, postnasal drip, sinus pressure, sneezing, sore throat and trouble swallowing.   Eyes: Negative for redness and itching.  Respiratory: Positive for cough, chest tightness and shortness of breath. Negative for wheezing.   Cardiovascular: Positive for leg swelling. Negative for palpitations.  Gastrointestinal: Negative for nausea and vomiting.  Genitourinary: Negative for dysuria.  Musculoskeletal: Negative for joint swelling.  Skin: Negative for rash.  Neurological: Negative for headaches.  Hematological: Does not bruise/bleed easily.  Psychiatric/Behavioral: Negative for dysphoric mood. The patient is not nervous/anxious.     Past Medical History  Diagnosis Date  . Hypertension   . Chest pain, non-cardiac   . Obesity   . Cigarette smoker   . IBS (irritable bowel syndrome)   . GERD (gastroesophageal reflux disease)   . Diverticulosis   . Pyloric erosion   . Allergic rhinitis   . Sleep apnea     CPAP setting varies from 4-10  . Chronic headaches   . Breast nodule     right breast, to see dr Helane Rima 06-20-2012 for   .  Complication of anesthesia 05-17-2012    trouble breathing after colonscopy, needed nebulizer     Family History  Problem Relation Age of Onset  . Hypertension Mother   . Diabetes Maternal Grandmother   . Colon cancer Neg Hx   . Lung cancer Mother   . Heart disease Father   . Heart attack Paternal Uncle      History   Social History  . Marital Status: Married    Spouse Name: [name]    Number of Children: 2  . Years of Education: 14+   Occupational History  . 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 Smoking status: Current Every Day Smoker -- 1.00 packs/day for 25 years    Types: Cigarettes  . Smokeless tobacco: Never Used  . Alcohol Use: Yes     Comment: very rare  . Drug Use: No  . Sexual Activity: Not Currently   Other Topics Concern  . Not on file   Social History Narrative   Patient lives at her home with her family.   Caffeine Use: 1 soda occasionally     Allergies  Allergen Reactions  . Hctz [Hydrochlorothiazide]     Causes a headache  . Lisinopril Cough  . Morphine And Related     Moodiness.  Causes anger.  . Other     guaifensen             Restlessness, jittery  . Percocet [Oxycodone-Acetaminophen] Other (See Comments)    "MAKES ME  MEAN"     Outpatient Prescriptions Prior to Visit  Medication Sig Dispense Refill  . acetaminophen (TYLENOL) 500 MG tablet Take 1,000 mg by mouth every 6 (six) hours as needed. Pain      . amLODipine (NORVASC) 10 MG tablet Take 1 tablet (10 mg total) by mouth daily.  90 tablet  3  . aspirin EC 81 MG tablet Take 1 tablet (81 mg total) by mouth daily.  30 tablet  0  . dicyclomine (BENTYL) 10 MG capsule Take one BID for spasm/cramping  60 capsule  0  . losartan (COZAAR) 100 MG tablet Take 1 tablet (100 mg total) by mouth daily.  90 tablet  3  . metoprolol succinate (TOPROL-XL) 50 MG 24 hr tablet Take 1 tablet (50 mg total) by mouth at bedtime.  90 tablet  3  . omeprazole (PRILOSEC) 20 MG capsule Take  20 mg by mouth daily as needed.       . isosorbide mononitrate (IMDUR) 30 MG 24 hr tablet Take 1 tablet (30 mg total) by mouth daily.  30 tablet  3   Facility-Administered Medications Prior to Visit  Medication Dose Route Frequency Provider Last Rate Last Dose  . albuterol (PROVENTIL,VENTOLIN) solution continuous neb  2.5 mg Nebulization Once Ladene Artist, MD             Objective:   Physical Exam Filed Vitals:   03/08/14 1509  BP: 142/80  Pulse: 75  Temp: 97.3 F (36.3 C)  TempSrc: Oral  Height: 5\' 6"  (1.676 m)  Weight: 273 lb (123.832 kg)  SpO2: 99%   Gen: Pleasant, obese, in no distress,  normal affect  ENT: No lesions,  mouth clear, mild L ptosis  Neck: No JVD, no TMG, no carotid bruits  Lungs: No use of accessory muscles, clear without rales or rhonchi  Cardiovascular: RRR, heart sounds normal, no murmur or gallops, trace peripheral edema  Musculoskeletal: No deformities, no cyanosis or clubbing  Neuro: alert, non focal  Skin: Warm, no lesions or rashes     Assessment & Plan:  Sleep apnea By description it sounds like she needs a pressure titration study. i have asked her to speak to Dr Brett Fairy about this  DOE (dyspnea on exertion) May be multifactorial - she does have evidence of CAD on her perfusion scan that is being worked up by Dr Acie Fredrickson.  PFT and her symptoms are most consistent with deconditioning. Her spirometry and volumes are restricted. She is relieved that the PFT don't support large amount of COPD. I counseled her that she still needed to stop smoking.  I think she would benefit from increased exercise AFTER cleared to do so by Dr Acie Fredrickson as her cardiac eval moves forward.

## 2014-03-08 NOTE — Assessment & Plan Note (Signed)
By description it sounds like she needs a pressure titration study. i have asked her to speak to Dr Brett Fairy about this

## 2014-03-08 NOTE — Assessment & Plan Note (Signed)
May be multifactorial - she does have evidence of CAD on her perfusion scan that is being worked up by Dr Acie Fredrickson.  PFT and her symptoms are most consistent with deconditioning. Her spirometry and volumes are restricted. She is relieved that the PFT don't support large amount of COPD. I counseled her that she still needed to stop smoking.  I think she would benefit from increased exercise AFTER cleared to do so by Dr Acie Fredrickson as her cardiac eval moves forward.

## 2014-03-14 ENCOUNTER — Telehealth: Payer: Self-pay | Admitting: Cardiovascular Disease

## 2014-03-14 NOTE — Telephone Encounter (Signed)
Pt c/o neck pain, jaw pain, headache, since starting Imdur, Dr Acie Fredrickson was made aware/ pt to stop Imdur and keep f/u app 03/19/14 Pt agreed to plan.

## 2014-03-14 NOTE — Telephone Encounter (Signed)
Patient feels that she is having side effects from medication Mimdur. She is having headache, neck and joint pain. She would like something called in for pain, [facility] [address] [phone] Please call and advise.

## 2014-03-19 ENCOUNTER — Encounter: Payer: Self-pay | Admitting: Cardiovascular Disease

## 2014-03-19 ENCOUNTER — Ambulatory Visit (INDEPENDENT_AMBULATORY_CARE_PROVIDER_SITE_OTHER): Payer: Managed Care, Other (non HMO) | Admitting: Cardiovascular Disease

## 2014-03-19 VITALS — BP 154/90 | HR 80 | Ht 66.0 in | Wt 270.8 lb

## 2014-03-19 DIAGNOSIS — R600 Localized edema: Secondary | ICD-10-CM

## 2014-03-19 DIAGNOSIS — R0789 Other chest pain: Secondary | ICD-10-CM

## 2014-03-19 DIAGNOSIS — R079 Chest pain, unspecified: Secondary | ICD-10-CM

## 2014-03-19 DIAGNOSIS — R609 Edema, unspecified: Secondary | ICD-10-CM

## 2014-03-19 MED ORDER — FUROSEMIDE 40 MG PO TABS: 40.0000 mg | ORAL_TABLET | Freq: Every day | ORAL | Status: DC

## 2014-03-19 MED ORDER — CARVEDILOL 25 MG PO TABS: 25.0000 mg | ORAL_TABLET | Freq: Two times a day (BID) | ORAL | Status: DC

## 2014-03-19 MED ORDER — POTASSIUM CHLORIDE ER 10 MEQ PO TBCR: 10.0000 meq | EXTENDED_RELEASE_TABLET | Freq: Every day | ORAL | Status: DC

## 2014-03-19 NOTE — Assessment & Plan Note (Signed)
Lisa [name] seems to be feeling better. Her episodes of chest pain or axis somewhat atypical. She had a Myoview study which revealed a very slight anterior defect which I suspect is due to breast attenuation. She had a similar defect on her previous Myoview study in 2008. The SDS equal 3.  She continues to smoke. She has gained quite a bit of weight.   I performed a cardiac catheterization on her in the past and she was found to have nonobstructive coronary artery disease.   I'll not opposed to doing a cardiac catheterization I think it we can make some improvements which may keep her from having a cardiac catheterization. She definitely needs to quit smoking. She started exercising next he feels better after exercising. I would like her to work on a been improved diet, exercise, and weight loss plan. We'll see her again in 2-3 months for followup visit. He she has episodes of chest pain or tightness in the interim, we will refer her for cardiac catheterization and possible PCI.

## 2014-03-19 NOTE — Progress Notes (Signed)
Lisa [name] Date of Birth  July 05, 1961 [facility] [address]    [place], [place]  [address] [phone]  Fax  [phone]   Problem List:  1. Chest pain - normal cath in the past 2. Cigarette smoking.   History of Present Illness:  Lisa [name] Is a 53 year old female with a history of hypertension. He's had some occasional episodes of chest discomfort.  These episodes of chest pain occur at times and are not necessarily associated with eating, drinking, change of position, or exercise.  She is walking on the treadmill 3 times a week.  She does not have chest pain while she is walking.  Feb. 26, 2014:  Lisa [name] is doing well.  She has gained weight since I last saw her.  She has not been walking as much.  March 19, 2014:   She was seen by Lisa [name] last month for some chest pain. She had a Myoview study which revealed an intermediate risk. She continues to smoke. She is very upset and emotional at her last office visit.  She was started on Imdur but was  having lots of side effects.   She is still trying to stop smoking.   She has attended the smoking cessation classes at [facility].    She has started walking with her son several days ago.  The exercise is going OK.    She does have some occasional click chest pains. These only last for several seconds.  She has had progressive bilateral edema    Current Outpatient Prescriptions on File Prior to Visit  Medication Sig Dispense Refill  . acetaminophen (TYLENOL) 500 MG tablet Take 1,000 mg by mouth every 6 (six) hours as needed. Pain      . amLODipine (NORVASC) 10 MG tablet Take 1 tablet (10 mg total) by mouth daily.  90 tablet  3  . aspirin EC 81 MG tablet Take 1 tablet (81 mg total) by mouth daily.  30 tablet  0  . dicyclomine (BENTYL) 10 MG capsule Take one BID for spasm/cramping  60 capsule  0  . losartan (COZAAR) 100 MG tablet Take 1 tablet (100 mg total) by mouth daily.  90 tablet  3  . metoprolol succinate  (TOPROL-XL) 50 MG 24 hr tablet Take 1 tablet (50 mg total) by mouth at bedtime.  90 tablet  3  . omeprazole (PRILOSEC) 20 MG capsule Take 20 mg by mouth daily as needed.        No current facility-administered medications on file prior to visit.    Allergies  Allergen Reactions  . Hctz [Hydrochlorothiazide]     Causes a headache  . Lisinopril Cough  . Morphine And Related     Moodiness.  Causes anger.  . Other     guaifensen             Restlessness, jittery  . Percocet [Oxycodone-Acetaminophen] Other (See Comments)    "MAKES ME MEAN"    Past Medical History  Diagnosis Date  . Hypertension   . Chest pain, non-cardiac   . Obesity   . Cigarette smoker   . IBS (irritable bowel syndrome)   . GERD (gastroesophageal reflux disease)   . Diverticulosis   . Pyloric erosion   . Allergic rhinitis   . Sleep apnea     CPAP setting varies from 4-10  . Chronic headaches   . Breast nodule     right breast, to see dr Helane Rima 06-20-2012 for   .  Complication of anesthesia 05-17-2012    trouble breathing after colonscopy, needed nebulizer    Past Surgical History  Procedure Laterality Date  . Cesarean section  2003, 2009    x 2  . Laparoscopy    . Umbilical hernia repair  2004  . US echocardiography  05/08/2008    EF 55-60%  . Cardiovascular stress test  03/25/2007    EF 78%  . Endometrial ablation  09/2010    and D&C  . Cardiac catheterization  12/28/2007    EF 75%. IT REVEALS NORMAL/SUPRANORMAL LEFT VENTRICULAR SYSTOLIC FUNCTION  . Cholecystectomy  1993  . Hernia repair  7846    umbilical  . Ventral hernia repair  06/15/2012    Procedure: LAPAROSCOPIC VENTRAL HERNIA;  Surgeon: Edward Jolly, MD;  Location: [facility]S;  Service: General;  Laterality: N/A;  LAPAROSCOPIC REPAIR VENTRAL HERNIA  . Colonoscopy  2014    History  Smoking status  . Current Every Day Smoker -- 1.00 packs/day for 25 years  . Types: Cigarettes  Smokeless tobacco  . Never Used    History  Alcohol Use   . Yes    Comment: very rare    Family History  Problem Relation Age of Onset  . Hypertension Mother   . Diabetes Maternal Grandmother   . Colon cancer Neg Hx   . Lung cancer Mother   . Heart disease Father   . Heart attack Paternal Uncle     Reviw of Systems:  Reviewed in the HPI.  All other systems are negative.  Physical Exam: BP 154/90  Pulse 80  Ht 5\' 6"  (1.676 m)  Wt 270 lb 12.8 oz (122.834 kg)  BMI 43.73 kg/m2 The patient is alert and oriented x 3.  The mood and affect are normal.   Skin: warm and dry.  Color is normal.   HEENT:   Normal carotids, no JVD, neck is supple Lungs: scattered wheezes.  Heart: RR, normal    Abdomen: + BS, no HSM Extremities:  Bilateral leg edema 1+ - 2+ with chroinic stasis changes Neuro:  Non focal     ECG: Feb. 26, 2014: NSR at 23.Marland Kitchen No ST or T wave abn.  Assessment / Plan:

## 2014-03-19 NOTE — Patient Instructions (Addendum)
Your physician has recommended you make the following change in your medication:  STOP Metoprolol (Toprol) START Coreg 25 mg twice daily START Lasix 40 mg once daily START KDur (Potassium supplement) 10 meq once daily  Your physician has requested that you have an echocardiogram. Echocardiography is a painless test that uses sound waves to create images of your heart. It provides your doctor with information about the size and shape of your heart and how well your heart's chambers and valves are working. This procedure takes approximately one hour. There are no restrictions for this procedure.  Your physician wants you to follow-up in: 2-3 months with Dr. Acie Fredrickson.  You will receive a reminder letter in the mail two months in advance. If you don't receive a letter, please call our office to schedule the follow-up appointment.

## 2014-03-30 ENCOUNTER — Ambulatory Visit (HOSPITAL_COMMUNITY): Payer: Managed Care, Other (non HMO) | Attending: Internal Medicine | Admitting: Radiology

## 2014-03-30 DIAGNOSIS — R600 Localized edema: Secondary | ICD-10-CM

## 2014-03-30 DIAGNOSIS — I517 Cardiomegaly: Secondary | ICD-10-CM | POA: Insufficient documentation

## 2014-03-30 DIAGNOSIS — I519 Heart disease, unspecified: Secondary | ICD-10-CM | POA: Insufficient documentation

## 2014-03-30 DIAGNOSIS — R609 Edema, unspecified: Secondary | ICD-10-CM

## 2014-03-30 DIAGNOSIS — R072 Precordial pain: Secondary | ICD-10-CM | POA: Insufficient documentation

## 2014-03-30 DIAGNOSIS — R079 Chest pain, unspecified: Secondary | ICD-10-CM

## 2014-03-30 NOTE — Progress Notes (Signed)
Echocardiogram performed.  

## 2014-04-06 ENCOUNTER — Telehealth: Payer: Self-pay | Admitting: Gastroenterology

## 2014-04-06 NOTE — Telephone Encounter (Signed)
Patient reports abdominal pain, LLQ, consistent with the pain she had in November 2014.  She has only taken one dose of dicyclomine since yesterday.  Per office note from 09/2013 patient to take dicyclomine 4 times a day.  She is also asked to make sure she is taking a probiotic and is on a high fiber diet.  She will call back on Monday if her symptoms don't improve

## 2014-04-11 ENCOUNTER — Telehealth: Payer: Self-pay | Admitting: Cardiovascular Disease

## 2014-04-11 NOTE — Telephone Encounter (Signed)
Left message for patient to call back  

## 2014-04-11 NOTE — Telephone Encounter (Signed)
Spoke with this patient about swelling in her legs again since Thursday. She has been taking all her medications as prescribed, including Lasix 40mg  once daily, and admits to some increased salt intake.Pt. encouraged to decrease salt intake and we will call her back tomorrow with more instructions after talking with Dr. Acie Fredrickson. Pt. Denies chest pain, some very mild shortness of breath.

## 2014-04-11 NOTE — Telephone Encounter (Signed)
New problem    Pt was taking her medication as directly but is still having muscle pain and other symptoms she had before.

## 2014-04-13 NOTE — Telephone Encounter (Addendum)
Spoke with patient to check on her swelling.  Patient states the swelling has decreased some, states they are slowly decreasing in size and left is less swollen than right.  Patient verbalizes that she was drinking a lot of soft drinks prior to calling on Wednesday and she wonders if this is the culprit for the swelling.  I advised patient that drinking 64 oz of water daily and only drinking soft drinks occasionally is more beneficial to good health.  Patient states she has been drinking more water since talking to Emeline Darling, RN on Wednesday and that she is taking her medication on time daily instead of when she remembers it.  Patient states her mild SOB is about the same.  I advised patient to continue with above regimen of taking medication on time, drinking more water, and to elevate feet whenever she is sitting and to monitor swelling and SOB and to call back with questions or concerns.  Patient verbalized agreement and understanding and thanked me for the call.

## 2014-05-04 ENCOUNTER — Other Ambulatory Visit: Payer: Self-pay | Admitting: Gastroenterology

## 2014-05-10 ENCOUNTER — Telehealth: Payer: Self-pay | Admitting: *Deleted

## 2014-05-10 NOTE — Telephone Encounter (Signed)
Put aspirin samples back in the closet that patient never picked up.

## 2014-05-24 ENCOUNTER — Telehealth: Payer: Self-pay | Admitting: Cardiovascular Disease

## 2014-05-24 NOTE — Telephone Encounter (Signed)
I agree with the plan as stated.  No new recs.

## 2014-05-24 NOTE — Telephone Encounter (Signed)
Advised patient and she will call back if no improvement or if blood pressure elevated. She will try to keep legs elevated more often.

## 2014-05-24 NOTE — Telephone Encounter (Signed)
New problem    Pt is having problems with feet swelling. Please call pt.

## 2014-05-24 NOTE — Telephone Encounter (Signed)
Spoke with patient and she started a new job and changed time she was taking lasix and potassium but still taking daily. Swelling does not go down at night and has been careful with her salt intake. Denies any increase shortness of breath. Has not been drinking as much water as she normally does. She also has had a headache from back of neck to top of head "like a mohawk". She has not been checking her blood pressure at home. Advised to start to monitor blood pressure at home. Will forward Dr Acie Fredrickson for review. Patient aware he is out of the office today.

## 2014-06-04 ENCOUNTER — Ambulatory Visit: Payer: Managed Care, Other (non HMO) | Admitting: Cardiovascular Disease

## 2014-07-10 ENCOUNTER — Encounter: Payer: Self-pay | Admitting: Gastroenterology

## 2014-07-23 ENCOUNTER — Ambulatory Visit: Payer: Managed Care, Other (non HMO) | Admitting: Cardiovascular Disease

## 2014-07-27 ENCOUNTER — Telehealth: Payer: Self-pay | Admitting: Cardiovascular Disease

## 2014-07-27 NOTE — Telephone Encounter (Signed)
Also, patient states she is unable to come to our office for a nurse visit/BP check due to training at her job.

## 2014-07-27 NOTE — Telephone Encounter (Signed)
Spoke with patient who states heart rate is lower than usual - states noted HR in the 50's since mid-morning today; states heart rate usually 88-92 bpm.  She states at one point today her heart rate went up to the high 60's but this was just after walking up the stairs.  Patient states she has not checked BP recently.  I reviewed medications and scheduling with patient who states she is taking all medications as directed.  Patient reports she has recently started exercising, drinking more water and less soda and states she is smoking much less than in the past.  I reviewed patient's diet and she admits to eating much less processed food than in the past.  I advised that these factors could contribute to a slower heart rate.  I advised patient to monitor BP and pulse this weekend 1 hour after she takes her medications and to call back next week with these readings.  Patient states she also continues to have some lower extremity swelling.  I advised patient to elevate her legs at home when she is able and to see if this decreases the swelling.  Patient verbalized understanding and agreement with plan of care.

## 2014-07-27 NOTE — Telephone Encounter (Signed)
New message    patient calling C/O heart rate.  All taken in left arm .   15 sec  56 30 sec  58 60 sec 59 .

## 2014-09-09 ENCOUNTER — Other Ambulatory Visit: Payer: Self-pay | Admitting: Cardiovascular Disease

## 2014-09-11 ENCOUNTER — Other Ambulatory Visit: Payer: Self-pay

## 2014-09-18 ENCOUNTER — Other Ambulatory Visit: Payer: Self-pay | Admitting: Cardiovascular Disease

## 2014-09-26 ENCOUNTER — Encounter: Payer: Self-pay | Admitting: Cardiovascular Disease

## 2014-09-26 ENCOUNTER — Ambulatory Visit (INDEPENDENT_AMBULATORY_CARE_PROVIDER_SITE_OTHER): Payer: Managed Care, Other (non HMO) | Admitting: Cardiovascular Disease

## 2014-09-26 VITALS — BP 112/64 | HR 76 | Ht 66.0 in | Wt 266.6 lb

## 2014-09-26 DIAGNOSIS — R0789 Other chest pain: Secondary | ICD-10-CM

## 2014-09-26 DIAGNOSIS — I10 Essential (primary) hypertension: Secondary | ICD-10-CM

## 2014-09-26 DIAGNOSIS — R600 Localized edema: Secondary | ICD-10-CM

## 2014-09-26 MED ORDER — POTASSIUM CHLORIDE ER 10 MEQ PO TBCR: 10.0000 meq | EXTENDED_RELEASE_TABLET | Freq: Two times a day (BID) | ORAL | Status: DC

## 2014-09-26 NOTE — Assessment & Plan Note (Signed)
She has not had any significant episodes of chest discomfort.

## 2014-09-26 NOTE — Progress Notes (Signed)
Lisa [name] Date of Birth  Nov 06, 1961 [facility] [address]    [place], [place]  [address] [phone]  Fax  [phone]   Problem List:  1. Chest pain - normal cath in the past 2. Cigarette smoking.   History of Present Illness:  Lisa [name] Is a 53 year old female with a history of hypertension. He's had some occasional episodes of chest discomfort.  These episodes of chest pain occur at times and are not necessarily associated with eating, drinking, change of position, or exercise.  She is walking on the treadmill 3 times a week.  She does not have chest pain while she is walking.  Feb. 26, 2014:  Lisa [name] is doing well.  She has gained weight since I last saw her.  She has not been walking as much.  March 19, 2014:   She was seen by [name] last month for some chest pain. She had a Myoview study which revealed an intermediate risk. She continues to smoke. She is very upset and emotional at her last office visit.  She was started on Imdur but was  having lots of side effects.   She is still trying to stop smoking.   She has attended the smoking cessation classes at [facility].    She has started walking with her son several days ago.  The exercise is going OK.    She does have some occasional click chest pains. These only last for several seconds.  She has had progressive bilateral edema    Oct. 28, 2015:  Lisa [name] feels ok.  No further episodes of CP Having numbness in her right thigh.   I suggested that this was a back or neuro issue. She has intermittant leg edema.   Current Outpatient Prescriptions on File Prior to Visit  Medication Sig Dispense Refill  . acetaminophen (TYLENOL) 500 MG tablet Take 1,000 mg by mouth every 6 (six) hours as needed. Pain      . amLODipine (NORVASC) 10 MG tablet TAKE 1 TABLET (10 MG TOTAL) BY MOUTH DAILY.  90 tablet  1  . aspirin EC 81 MG tablet Take 1 tablet (81 mg total) by mouth daily.  30 tablet  0  . carvedilol  (COREG) 25 MG tablet Take 1 tablet (25 mg total) by mouth 2 (two) times daily.  180 tablet  3  . furosemide (LASIX) 40 MG tablet Take 1 tablet (40 mg total) by mouth daily.  90 tablet  3  . losartan (COZAAR) 100 MG tablet Take 1 tablet (100 mg total) by mouth daily.  90 tablet  3  . omeprazole (PRILOSEC) 20 MG capsule Take 20 mg by mouth daily as needed.       . potassium chloride (K-DUR) 10 MEQ tablet Take 1 tablet (10 mEq total) by mouth daily.  90 tablet  3   No current facility-administered medications on file prior to visit.    Allergies  Allergen Reactions  . Hctz [Hydrochlorothiazide]     Causes a headache  . Lisinopril Cough    Cough   . Morphine And Related     Moodiness.  Causes anger.  . Other     guaifensen             Restlessness, jittery  . Percocet [Oxycodone-Acetaminophen] Other (See Comments)    "MAKES ME MEAN"    Past Medical History  Diagnosis Date  . Hypertension   . Chest pain, non-cardiac   . Obesity   .  Cigarette smoker   . IBS (irritable bowel syndrome)   . GERD (gastroesophageal reflux disease)   . Diverticulosis   . Pyloric erosion   . Allergic rhinitis   . Sleep apnea     CPAP setting varies from 4-10  . Chronic headaches   . Breast nodule     right breast, to see dr Helane Rima 06-20-2012 for   . Complication of anesthesia 05-17-2012    trouble breathing after colonscopy, needed nebulizer    Past Surgical History  Procedure Laterality Date  . Cesarean section  2003, 2009    x 2  . Laparoscopy    . Umbilical hernia repair  2004  . US echocardiography  05/08/2008    EF 55-60%  . Cardiovascular stress test  03/25/2007    EF 78%  . Endometrial ablation  09/2010    and D&C  . Cardiac catheterization  12/28/2007    EF 75%. IT REVEALS NORMAL/SUPRANORMAL LEFT VENTRICULAR SYSTOLIC FUNCTION  . Cholecystectomy  1993  . Hernia repair  5859    umbilical  . Ventral hernia repair  06/15/2012    Procedure: LAPAROSCOPIC VENTRAL HERNIA;  Surgeon: Edward Jolly, MD;  Location: [facility];  Service: General;  Laterality: N/A;  LAPAROSCOPIC REPAIR VENTRAL HERNIA  . Colonoscopy  2014    History  Smoking status  . Current Every Day Smoker -- 1.00 packs/day for 25 years  . Types: Cigarettes  Smokeless tobacco  . Never Used    History  Alcohol Use  . Yes    Comment: very rare    Family History  Problem Relation Age of Onset  . Hypertension Mother   . Diabetes Maternal Grandmother   . Colon cancer Neg Hx   . Lung cancer Mother   . Heart disease Father   . Heart attack Paternal Uncle     Reviw of Systems:  Reviewed in the HPI.  All other systems are negative.  Physical Exam: BP 112/64  Pulse 76  Ht 5\' 6"  (1.676 m)  Wt 266 lb 9.6 oz (120.929 kg)  BMI 43.05 kg/m2 The patient is alert and oriented x 3.  The mood and affect are normal.   Skin: warm and dry.  Color is normal.   HEENT:   Normal carotids, no JVD, neck is supple Lungs: scattered wheezes.  Heart: RR, normal    Abdomen: + BS, no HSM Extremities:  Bilateral leg edema 1+ - 2+ ,  Right leg >> left leg.   with chroinic stasis changes Neuro:  Non focal     ECG: Feb. 26, 2014: NSR at 17.Marland Kitchen No ST or T wave abn.  Assessment / Plan:

## 2014-09-26 NOTE — Patient Instructions (Signed)
Your physician has recommended you make the following change in your medication:  INCREASE Potassium to 10 meq twice daily  Your physician wants you to follow-up in: 6 months with Dr. Acie Fredrickson.  You will receive a reminder letter in the mail two months in advance. If you don't receive a letter, please call our office to schedule the follow-up appointment.

## 2014-09-26 NOTE — Assessment & Plan Note (Addendum)
She's having lots of problems with leg aches which he takes her Lasix. I suspect that she is actually hypokalemic. We'll increase her potassium to 10 mEq twice a day.  She is overall doing well. Her legs are chronically swollen. I've recommended that she start a regular exercise program. She needs to be careful with her salt. She occasionally slips up and has country ham.

## 2014-09-27 ENCOUNTER — Ambulatory Visit (INDEPENDENT_AMBULATORY_CARE_PROVIDER_SITE_OTHER): Payer: Managed Care, Other (non HMO) | Admitting: Emergency Medicine

## 2014-09-27 ENCOUNTER — Encounter: Payer: Self-pay | Admitting: Emergency Medicine

## 2014-09-27 VITALS — BP 130/78 | HR 74 | Temp 98.4°F | Ht 66.0 in | Wt 269.2 lb

## 2014-09-27 DIAGNOSIS — G4730 Sleep apnea, unspecified: Secondary | ICD-10-CM

## 2014-09-27 DIAGNOSIS — R0609 Other forms of dyspnea: Secondary | ICD-10-CM

## 2014-09-27 NOTE — Patient Instructions (Signed)
Please continue to work on trying to decrease your cigarettes. We agreed today you would cut down to 15 cigarettes a day starting tomorrow 09/28/14. We can discuss the next step for cutting down at your next visit.  Follow with Dr Lamonte Sakai in 2 months or sooner if you have any problems.

## 2014-09-27 NOTE — Assessment & Plan Note (Signed)
I believe much of this is deconditioning. I talked to her today about stopping smoking. Asked her to increase her exercise as able. We could consider repeat spiro in th future.

## 2014-09-27 NOTE — Progress Notes (Signed)
Subjective:    Patient ID: Lisa [name], female    DOB: Jan 12, 1961, 53 y.o.   MRN: [id number]  HPI 53 yo smoker (30-40 pk-yrs) with OSA (on CPAP), allergies, HTN, non-cardiac CP, GERD and IBS. Followed and referred by L. Gerhardt with cardiology.  She has been seen for dyspnea. Chemical myocardial perfusion scan 4/7 > small reversible defer distal LAD distribution.  CXR 3/17 normal.  She is referred here to further eval her SOB. She has exertional muscle fatigue. No wheeze. Some cough, bothers her after a URI or with allergies. Also has GERD that isn't reliably treated. She used to be on lisinopril but changed to losartan. She had PFT 3/'15 that show primarily restriction.   ROV 09/27/14 -- former smoker follows up for dyspnea, mixed disease on PFT. She has seen Dr Acie Fredrickson who recommended exercise, wt loss. Did not repeat her L cath at that time. She has been having trouble with leg cramps - increased her potassium. She is noticing some dyspnea with climbing stairs. Doesn't happen every time.  She is still smoking but has tried to cut down.     Review of Systems  Constitutional: Negative for fever and unexpected weight change.  HENT: Negative for congestion, dental problem, ear pain, nosebleeds, postnasal drip, rhinorrhea, sinus pressure, sneezing, sore throat and trouble swallowing.   Eyes: Negative for redness and itching.  Respiratory: Positive for shortness of breath. Negative for cough, chest tightness and wheezing.   Cardiovascular: Negative for palpitations and leg swelling.  Gastrointestinal: Negative for nausea and vomiting.  Genitourinary: Negative for dysuria.  Musculoskeletal: Negative for joint swelling.  Skin: Negative for rash.  Neurological: Negative for headaches.  Hematological: Does not bruise/bleed easily.  Psychiatric/Behavioral: Negative for dysphoric mood. The patient is not nervous/anxious.     Past Medical History  Diagnosis Date  . Hypertension   . Chest  pain, non-cardiac   . Obesity   . Cigarette smoker   . IBS (irritable bowel syndrome)   . GERD (gastroesophageal reflux disease)   . Diverticulosis   . Pyloric erosion   . Allergic rhinitis   . Sleep apnea     CPAP setting varies from 4-10  . Chronic headaches   . Breast nodule     right breast, to see dr Helane Rima 06-20-2012 for   . Complication of anesthesia 05-17-2012    trouble breathing after colonscopy, needed nebulizer     Family History  Problem Relation Age of Onset  . Hypertension Mother   . Diabetes Maternal Grandmother   . Colon cancer Neg Hx   . Lung cancer Mother   . Heart disease Father   . Heart attack Paternal Uncle      History   Social History  . Marital Status: Married    Spouse Name: [name]    Number of Children: 2  . Years of Education: 14+   Occupational History  . 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 Smoking status: Current Every Day Smoker -- 1.00 packs/day for 25 years    Types: Cigarettes  . Smokeless tobacco: Never Used  . Alcohol Use: Yes     Comment: very rare  . Drug Use: No  . Sexual Activity: Not Currently   Other Topics Concern  . Not on file   Social History Narrative   Patient lives at her home with her family.   Caffeine Use: 1 soda occasionally  Allergies  Allergen Reactions  . Hctz [Hydrochlorothiazide]     Causes a headache  . Lisinopril Cough    Cough   . Morphine And Related     Moodiness.  Causes anger.  . Other     guaifensen             Restlessness, jittery  . Percocet [Oxycodone-Acetaminophen] Other (See Comments)    "MAKES ME MEAN"     Outpatient Prescriptions Prior to Visit  Medication Sig Dispense Refill  . acetaminophen (TYLENOL) 500 MG tablet Take 1,000 mg by mouth every 6 (six) hours as needed. Pain      . amLODipine (NORVASC) 10 MG tablet TAKE 1 TABLET (10 MG TOTAL) BY MOUTH DAILY.  90 tablet  1  . aspirin EC 81 MG tablet Take 1 tablet (81 mg total) by mouth daily.   30 tablet  0  . carvedilol (COREG) 25 MG tablet Take 1 tablet (25 mg total) by mouth 2 (two) times daily.  180 tablet  3  . furosemide (LASIX) 40 MG tablet Take 1 tablet (40 mg total) by mouth daily.  90 tablet  3  . losartan (COZAAR) 100 MG tablet Take 1 tablet (100 mg total) by mouth daily.  90 tablet  3  . omeprazole (PRILOSEC) 20 MG capsule Take 20 mg by mouth daily as needed.       . potassium chloride (K-DUR) 10 MEQ tablet Take 1 tablet (10 mEq total) by mouth 2 (two) times daily.  180 tablet  3   No facility-administered medications prior to visit.         Objective:   Physical Exam Filed Vitals:   09/27/14 1508  BP: 130/78  Pulse: 74  Temp: 98.4 F (36.9 C)  TempSrc: Oral  Height: 5\' 6"  (1.676 m)  Weight: 269 lb 3.2 oz (122.108 kg)  SpO2: 98%   Gen: Pleasant, obese, in no distress,  normal affect  ENT: No lesions,  mouth clear, mild L ptosis  Neck: No JVD, no TMG, no carotid bruits  Lungs: No use of accessory muscles, clear without rales or rhonchi  Cardiovascular: RRR, heart sounds normal, no murmur or gallops, trace peripheral edema  Musculoskeletal: No deformities, no cyanosis or clubbing  Neuro: alert, non focal  Skin: Warm, no lesions or rashes     Assessment & Plan:  DOE (dyspnea on exertion) I believe much of this is deconditioning. I talked to her today about stopping smoking. Asked her to increase her exercise as able. We could consider repeat spiro in th future.  Sleep apnea She hasn't followed with neurology yet about a repeat PSG. i asked her to pursue this.

## 2014-09-27 NOTE — Assessment & Plan Note (Signed)
She hasn't followed with neurology yet about a repeat PSG. i asked her to pursue this.

## 2014-10-16 ENCOUNTER — Other Ambulatory Visit: Payer: Self-pay | Admitting: Obstetrics and Gynecology

## 2014-10-18 ENCOUNTER — Encounter: Payer: Self-pay | Admitting: Gastroenterology

## 2014-10-18 ENCOUNTER — Telehealth: Payer: Self-pay | Admitting: Gastroenterology

## 2014-10-18 LAB — CYTOLOGY - PAP

## 2014-10-18 NOTE — Telephone Encounter (Signed)
Patient noted abdominal pain that started yesterday.  She reports epigastric pain and and nausea. She did have some black stools also today.  She will come in and see Nicoletta Ba PA tomorrow at 3:00.  She is not sure if she can get off of work, she will call back and cancel if she can't get off work

## 2014-10-19 ENCOUNTER — Encounter: Payer: Self-pay | Admitting: Physician Assistant

## 2014-10-19 ENCOUNTER — Ambulatory Visit (INDEPENDENT_AMBULATORY_CARE_PROVIDER_SITE_OTHER): Payer: Managed Care, Other (non HMO) | Admitting: Physician Assistant

## 2014-10-19 VITALS — BP 140/70 | HR 80 | Ht 66.0 in | Wt 269.6 lb

## 2014-10-19 DIAGNOSIS — Z8719 Personal history of other diseases of the digestive system: Secondary | ICD-10-CM

## 2014-10-19 DIAGNOSIS — R195 Other fecal abnormalities: Secondary | ICD-10-CM

## 2014-10-19 DIAGNOSIS — R1013 Epigastric pain: Secondary | ICD-10-CM

## 2014-10-19 DIAGNOSIS — R079 Chest pain, unspecified: Secondary | ICD-10-CM

## 2014-10-19 NOTE — Progress Notes (Signed)
Reviewed and agree with management plan.  Taevon Aschoff T. Briahna Pescador, MD FACG 

## 2014-10-19 NOTE — Patient Instructions (Addendum)
You have been given a separate informational sheet regarding your tobacco use, the importance of quitting and local resources to help you quit. Take the Omeprazole  ( 2 - 20 mg tablets)  twice daily for 2 weeks then go to  (2- 20 mg tab)  once daily.  We sent a prescription for Bentyl 10 mg to [facility] . Take 1 tab twice daily for cramping and spasms. You have been scheduled for an endoscopy. Please follow written instructions given to you at your visit today. If you use inhalers (even only as needed), please bring them with you on the day of your procedure. Your physician has requested that you go to www.startemmi.com and enter the access code given to you at your visit today. This web site gives a general overview about your procedure. However, you should still follow specific instructions given to you by our office regarding your preparation for the procedure.

## 2014-10-19 NOTE — Progress Notes (Signed)
Patient ID: Lisa [name], female   DOB: 01-Mar-1961, 53 y.o.   MRN: [id number]   Subjective:    Patient ID: Lisa [name], female    DOB: [birth year], 53 y.o.   MRN: [id number]  HPI Lisa [name] is a pleasant 53 year old African-American female known to Dr. Fuller Plan who has history of hypertension, sleep apnea, smoker, morbid obesity, and diverticular disease. She is status post cholecystectomy, endometrial ablation, umbilical hernia repair and she states repair of a hiatal hernia remotely. She had undergone an EGD in 2013 as well as colonoscopy. The EGD was normal, endoscopy pertinent for diverticulosis of the left colon and 2 small hyperplastic polyps Patient comes in today with complaints of epigastric and chest pain, vague nausea and an episode of black stool yesterday. Patient states that she's been having substernal chest pains off and on for the past few weeks. She had been to see her cardiologist Dr. Cathie Olden who states this is noncardiac. He says the pain is present most of the day and may be aggravated by certain movements or positions. She has no associated shortness of breath dizziness etc. She denies any dysphagia or odynophagia but has been having indigestion and increased burping. She says now the pain has radiated down into her mid abdomen. She feels bloated in her upper abdomen and also complains of lower abdominal pain which seems to be worse with bowel movements at times. She has frequent looser stools which is chronic for her. Often has urgency postprandially. She was alarmed yesterday because she passed a black stool. She specifically states she did not take any Pepto-Bismol, today she says her stool was still dark but not black. He aspirin once daily no regular NSAID use. He has a prescription for omeprazole 20 mg but has not been taking this on a regular basis. She also has a prescription for dicyclomine but had not been using that..  Review of Systems Pertinent positive and negative review  of systems were noted in the above HPI section.  All other review of systems was otherwise negative.  Outpatient Encounter Prescriptions as of 10/19/2014  Medication Sig  . acetaminophen (TYLENOL) 500 MG tablet Take 1,000 mg by mouth every 6 (six) hours as needed. Pain  . amLODipine (NORVASC) 10 MG tablet TAKE 1 TABLET (10 MG TOTAL) BY MOUTH DAILY.  Marland Kitchen aspirin EC 81 MG tablet Take 1 tablet (81 mg total) by mouth daily.  . carvedilol (COREG) 25 MG tablet Take 1 tablet (25 mg total) by mouth 2 (two) times daily.  . furosemide (LASIX) 40 MG tablet Take 1 tablet (40 mg total) by mouth daily.  Marland Kitchen losartan (COZAAR) 100 MG tablet Take 1 tablet (100 mg total) by mouth daily.  Marland Kitchen omeprazole (PRILOSEC) 20 MG capsule Take 20 mg by mouth daily as needed.   . potassium chloride (K-DUR) 10 MEQ tablet Take 1 tablet (10 mEq total) by mouth 2 (two) times daily.   Allergies  Allergen Reactions  . Hctz [Hydrochlorothiazide]     Causes a headache  . Lisinopril Cough    Cough   . Morphine And Related     Moodiness.  Causes anger.  . Other     guaifensen             Restlessness, jittery  . Percocet [Oxycodone-Acetaminophen] Other (See Comments)    "MAKES ME MEAN"   Patient Active Problem List   Diagnosis Date Noted  . DOE (dyspnea on exertion) 03/08/2014  . Abdominal pain, left lower quadrant 08/23/2013  .  Migraine without aura 07/26/2013  . Incisional hernia 02/11/2012  . Sleep apnea 02/11/2012  . Chest discomfort 10/12/2011  . HTN (hypertension) 10/12/2011   History   Social History  . Marital Status: Married    Spouse Name: [name]    Number of Children: 2  . Years of Education: 14+   Occupational History  . 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 Smoking status: Current Every Day Smoker -- 1.00 packs/day for 25 years    Types: Cigarettes  . Smokeless tobacco: Never Used     Comment: form given 10/19/14  . Alcohol Use: 0.0 oz/week    0 Not specified per week      Comment: very rare  . Drug Use: No  . Sexual Activity: Not Currently   Other Topics Concern  . Not on file   Social History Narrative   Patient lives at her home with her family.   Caffeine Use: 1 soda occasionally    [name] family history includes Breast cancer in an other family member; Diabetes in her maternal grandmother; Heart attack in her paternal uncle; Heart disease in her father; Hypertension in her mother; Lung cancer in her mother. There is no history of Colon cancer.      Objective:    Filed Vitals:   10/19/14 1510  BP: 140/70  Pulse: 80    Physical Exam  well-developed morbidly obese African-American female in no acute distress, pleasant blood pressure 140/70 pulse 80 height 5 foot 6 weight 269, BMI 43.5. HEENT; nontraumatic normocephalic EOMI PERRLA sclera anicteric, Supple; no JVD, Cardiovascular; regular rate and rhythm with S1-S2 no murmur rub or gallop no tenderness to palpation of the sternum or chest wall or costal margin, Abdomen ;obese, soft bowel sounds are present, she is tender in the epigastrium and bilaterally in the lower quadrants is no guarding or rebound no palpable mass or hepatosplenomegaly bowel sounds are present she does have incisional scars, Rectal; exam stool is brown and Hemoccult positive, Extremities; no clubbing cyanosis or edema skin warm and dry, Psych; mood and affect appropriate     Assessment & Plan:   #55 53 year old female presenting with several week history of epigastric and lower substernal chest pains vague nausea and now with 1 episode of black stool yesterday. Patient is Hemoccult positive on exam today but no overt melena. Rule out aspirin-induced gastropathy, esophagitis and/or peptic ulcer disease #2 status post cholecystectomy #3 status post umbilical hernia repair For history of diverticular disease #5 history of hyperplastic colon polyps last colonoscopy June 2013-dated 410 year interval follow-up #6 morbid  obesity BMI 43 #7 sleep apnea #8 hypertension  Plan; CBC, and BMET Start Prilosec 40 mg by mouth twice daily 2 weeks then decrease to 40 mg once daily if feeling better Stop baby aspirin and avoid NSAIDs Restart Bentyl 10 mg once or twice daily for cramping and urgency Schedule for EGD with Dr. Fuller Plan. Procedure discussed in detail with patient and she is agreeable to proceed.  The patient did not want to schedule procedure prior to December 15, due to work schedule. However she is advised if she has any further evidence of bleeding or any drop in hemoglobin procedure may need to be done sooner  She is  also advised if she has any overt bleeding or recurrent melena she may require evaluation through the emergency room. Further plans pending results of labs   Amy Genia Harold PA-C 10/19/2014

## 2014-11-07 ENCOUNTER — Encounter: Payer: Self-pay | Admitting: Gastroenterology

## 2014-11-10 ENCOUNTER — Other Ambulatory Visit: Payer: Self-pay | Admitting: Nurse Practitioner

## 2014-11-13 ENCOUNTER — Ambulatory Visit (AMBULATORY_SURGERY_CENTER): Payer: Managed Care, Other (non HMO) | Admitting: Gastroenterology

## 2014-11-13 ENCOUNTER — Encounter: Payer: Self-pay | Admitting: Gastroenterology

## 2014-11-13 VITALS — BP 150/97 | HR 65 | Temp 98.2°F | Resp 19 | Ht 66.0 in | Wt 269.0 lb

## 2014-11-13 DIAGNOSIS — R1013 Epigastric pain: Secondary | ICD-10-CM

## 2014-11-13 DIAGNOSIS — R195 Other fecal abnormalities: Secondary | ICD-10-CM

## 2014-11-13 DIAGNOSIS — K2950 Unspecified chronic gastritis without bleeding: Secondary | ICD-10-CM

## 2014-11-13 DIAGNOSIS — K921 Melena: Secondary | ICD-10-CM

## 2014-11-13 DIAGNOSIS — K2970 Gastritis, unspecified, without bleeding: Secondary | ICD-10-CM

## 2014-11-13 MED ORDER — OMEPRAZOLE 40 MG PO CPDR
40.0000 mg | DELAYED_RELEASE_CAPSULE | Freq: Every day | ORAL | Status: DC
Start: 2014-11-13 — End: 2016-01-29

## 2014-11-13 MED ORDER — SODIUM CHLORIDE 0.9 % IV SOLN: 500.0000 mL | INTRAVENOUS | Status: DC

## 2014-11-13 MED ORDER — DICYCLOMINE HCL 10 MG PO CAPS: 10.0000 mg | ORAL_CAPSULE | Freq: Three times a day (TID) | ORAL | Status: DC | PRN

## 2014-11-13 NOTE — Progress Notes (Signed)
  [facility] Post-op Note  Patient: Lisa [name]  Procedure(s) Performed: endoscopy  Patient Location: [facility]  Anesthesia Type: Deep Sedation/Propofol  Level of Consciousness: awake, oriented and patient cooperative  Airway and Oxygen Therapy: Patient Spontanous Breathing  Post-op Pain: none  Post-op Assessment:  Post-op Vital signs reviewed, Patient's Cardiovascular Status Stable, Respiratory Function Stable, Patent Airway, No signs of Nausea or vomiting and Pain level controlled  Post-op Vital Signs: Reviewed and stable  Complications: No apparent anesthesia complications  Juancarlos Crescenzo E 4:21 PM

## 2014-11-13 NOTE — Patient Instructions (Addendum)

## 2014-11-13 NOTE — Op Note (Signed)
[facility]  [address]. [place], [address]   ENDOSCOPY PROCEDURE REPORT  PATIENT: Lisa [name], Lisa [name]  MR#: [id number] BIRTHDATE: Apr 28, 1961 , 53  yrs. old GENDER: female ENDOSCOPIST: Ladene Artist, MD, [facility] REFERRED BY: PROCEDURE DATE:  11/13/2014 PROCEDURE:  EGD w/ biopsy ASA CLASS:     Class III INDICATIONS:  epigastric pain and hemocult positive stool. MEDICATIONS: Monitored anesthesia care and Propofol 150 mg IV TOPICAL ANESTHETIC: none DESCRIPTION OF PROCEDURE: After the risks benefits and alternatives of the procedure were thoroughly explained, informed consent was obtained.  The LB [id number] endoscope was introduced through the mouth and advanced to the second portion of the duodenum , Without limitations.  The instrument was slowly withdrawn as the mucosa was fully examined.  STOMACH: Moderate gastritis was found in the gastric antrum and gastric body.  Multiple biopsies were performed.   The stomach otherwise appeared normal. ESOPHAGUS: The mucosa of the esophagus appeared normal. DUODENUM: The duodenal mucosa showed no abnormalities in the bulb and 2nd part of the duodenum.  Retroflexed views revealed no abnormalities.   The scope was then withdrawn from the patient and the procedure completed.  COMPLICATIONS: There were no immediate complications.  ENDOSCOPIC IMPRESSION: 1.   Gastritis in the gastric antrum and gastric body; multiple biopsies performed 2.   The EGD otherwise appeared normal  RECOMMENDATIONS: 1.  Avoid ASA/NSAIDS long term 2.  Anti-reflux regimen long term 3.  Continue PPI: omeprazole 40 mg po qam, 1 year of refills 4.  Continue Bentyl 10 mg tid as needed for abd cramping, bloating, 1 year of refills 5.  Await pathology results  eSigned:  Ladene Artist, MD, [facility] 11/13/2014 4:21 PM   PJ:ASNKN Tammi Klippel, MD

## 2014-11-13 NOTE — Progress Notes (Signed)
Pt states that she had 1 pork skin at 1030 this morning.  Lajuana Carry, CRNA notified.  Will proceed with procedure as planned.

## 2014-11-13 NOTE — Progress Notes (Signed)
Called to room to assist during endoscopic procedure.  Patient ID and intended procedure confirmed with present staff. Received instructions for my participation in the procedure from the performing physician.  

## 2014-11-14 ENCOUNTER — Telehealth: Payer: Self-pay | Admitting: *Deleted

## 2014-11-14 NOTE — Telephone Encounter (Signed)
Left message that we called for f/u 

## 2014-11-23 ENCOUNTER — Encounter: Payer: Self-pay | Admitting: Gastroenterology

## 2014-11-28 ENCOUNTER — Ambulatory Visit: Payer: Managed Care, Other (non HMO) | Admitting: Emergency Medicine

## 2015-01-01 ENCOUNTER — Emergency Department (HOSPITAL_COMMUNITY): Payer: Managed Care, Other (non HMO)

## 2015-01-01 ENCOUNTER — Emergency Department (HOSPITAL_COMMUNITY)
Admission: EM | Admit: 2015-01-01 | Discharge: 2015-01-01 | Disposition: A | Discharge status: Home / Self Care | Payer: Managed Care, Other (non HMO) | Attending: Emergency Medicine | Admitting: Emergency Medicine

## 2015-01-01 ENCOUNTER — Encounter (HOSPITAL_COMMUNITY): Payer: Self-pay | Admitting: Emergency Medicine

## 2015-01-01 ENCOUNTER — Encounter (HOSPITAL_COMMUNITY): Payer: Self-pay | Admitting: Physical Medicine and Rehabilitation

## 2015-01-01 ENCOUNTER — Telehealth: Payer: Self-pay | Admitting: Gastroenterology

## 2015-01-01 ENCOUNTER — Emergency Department (INDEPENDENT_AMBULATORY_CARE_PROVIDER_SITE_OTHER)
Admission: EM | Admit: 2015-01-01 | Discharge: 2015-01-01 | Disposition: A | Discharge status: Home / Self Care | Payer: Managed Care, Other (non HMO) | Source: Home / Self Care | Attending: Family Medicine | Admitting: Family Medicine

## 2015-01-01 DIAGNOSIS — Z9981 Dependence on supplemental oxygen: Secondary | ICD-10-CM | POA: Insufficient documentation

## 2015-01-01 DIAGNOSIS — R630 Anorexia: Secondary | ICD-10-CM | POA: Diagnosis not present

## 2015-01-01 DIAGNOSIS — Z8709 Personal history of other diseases of the respiratory system: Secondary | ICD-10-CM | POA: Insufficient documentation

## 2015-01-01 DIAGNOSIS — J45909 Unspecified asthma, uncomplicated: Secondary | ICD-10-CM | POA: Diagnosis not present

## 2015-01-01 DIAGNOSIS — Z9089 Acquired absence of other organs: Secondary | ICD-10-CM | POA: Diagnosis not present

## 2015-01-01 DIAGNOSIS — R590 Localized enlarged lymph nodes: Secondary | ICD-10-CM | POA: Diagnosis not present

## 2015-01-01 DIAGNOSIS — Z720 Tobacco use: Secondary | ICD-10-CM | POA: Diagnosis not present

## 2015-01-01 DIAGNOSIS — Z7982 Long term (current) use of aspirin: Secondary | ICD-10-CM | POA: Diagnosis not present

## 2015-01-01 DIAGNOSIS — Z9851 Tubal ligation status: Secondary | ICD-10-CM | POA: Diagnosis not present

## 2015-01-01 DIAGNOSIS — E669 Obesity, unspecified: Secondary | ICD-10-CM | POA: Insufficient documentation

## 2015-01-01 DIAGNOSIS — R1032 Left lower quadrant pain: Secondary | ICD-10-CM

## 2015-01-01 DIAGNOSIS — Z9889 Other specified postprocedural states: Secondary | ICD-10-CM | POA: Insufficient documentation

## 2015-01-01 DIAGNOSIS — K219 Gastro-esophageal reflux disease without esophagitis: Secondary | ICD-10-CM | POA: Diagnosis not present

## 2015-01-01 DIAGNOSIS — Z8742 Personal history of other diseases of the female genital tract: Secondary | ICD-10-CM | POA: Insufficient documentation

## 2015-01-01 DIAGNOSIS — I10 Essential (primary) hypertension: Secondary | ICD-10-CM | POA: Diagnosis not present

## 2015-01-01 DIAGNOSIS — G4733 Obstructive sleep apnea (adult) (pediatric): Secondary | ICD-10-CM | POA: Diagnosis not present

## 2015-01-01 DIAGNOSIS — Z79899 Other long term (current) drug therapy: Secondary | ICD-10-CM | POA: Diagnosis not present

## 2015-01-01 DIAGNOSIS — Z87898 Personal history of other specified conditions: Secondary | ICD-10-CM | POA: Insufficient documentation

## 2015-01-01 LAB — CBC WITH DIFFERENTIAL/PLATELET
BASOS ABS: 0 10*3/uL (ref 0.0–0.1)
BASOS PCT: 0 % (ref 0–1)
EOS ABS: 0.1 10*3/uL (ref 0.0–0.7)
Eosinophils Relative: 1 % (ref 0–5)
HCT: 43 % (ref 36.0–46.0)
Hemoglobin: 14.5 g/dL (ref 12.0–15.0)
LYMPHS PCT: 32 % (ref 12–46)
Lymphs Abs: 2.7 10*3/uL (ref 0.7–4.0)
MCH: 30.1 pg (ref 26.0–34.0)
MCHC: 33.7 g/dL (ref 30.0–36.0)
MCV: 89.2 fL (ref 78.0–100.0)
MONOS PCT: 7 % (ref 3–12)
Monocytes Absolute: 0.6 10*3/uL (ref 0.1–1.0)
Neutro Abs: 5 10*3/uL (ref 1.7–7.7)
Neutrophils Relative %: 60 % (ref 43–77)
Platelets: 267 10*3/uL (ref 150–400)
RBC: 4.82 MIL/uL (ref 3.87–5.11)
RDW: 13.8 % (ref 11.5–15.5)
WBC: 8.5 10*3/uL (ref 4.0–10.5)

## 2015-01-01 LAB — COMPREHENSIVE METABOLIC PANEL
ALBUMIN: 3.7 g/dL (ref 3.5–5.2)
ALK PHOS: 87 U/L (ref 39–117)
ALT: 27 U/L (ref 0–35)
ANION GAP: 6 (ref 5–15)
AST: 20 U/L (ref 0–37)
BUN: 17 mg/dL (ref 6–23)
CALCIUM: 9.2 mg/dL (ref 8.4–10.5)
CO2: 24 mmol/L (ref 19–32)
Chloride: 106 mmol/L (ref 96–112)
Creatinine, Ser: 0.86 mg/dL (ref 0.50–1.10)
GFR calc Af Amer: 89 mL/min — ABNORMAL LOW (ref >90)
GFR calc non Af Amer: 77 mL/min — ABNORMAL LOW (ref >90)
Glucose, Bld: 129 mg/dL — ABNORMAL HIGH (ref 70–99)
Potassium: 3.9 mmol/L (ref 3.5–5.1)
SODIUM: 136 mmol/L (ref 135–145)
Total Bilirubin: 0.4 mg/dL (ref 0.3–1.2)
Total Protein: 7.5 g/dL (ref 6.0–8.3)

## 2015-01-01 LAB — URINALYSIS, ROUTINE W REFLEX MICROSCOPIC
Bilirubin Urine: NEGATIVE
Glucose, UA: NEGATIVE mg/dL
Ketones, ur: NEGATIVE mg/dL
Leukocytes, UA: NEGATIVE
Nitrite: NEGATIVE
PROTEIN: NEGATIVE mg/dL
SPECIFIC GRAVITY, URINE: 1.026 (ref 1.005–1.030)
Urobilinogen, UA: 1 mg/dL (ref 0.0–1.0)
pH: 5 (ref 5.0–8.0)

## 2015-01-01 LAB — POCT URINALYSIS DIP (DEVICE)
Bilirubin Urine: NEGATIVE
GLUCOSE, UA: NEGATIVE mg/dL
Ketones, ur: NEGATIVE mg/dL
Leukocytes, UA: NEGATIVE
NITRITE: NEGATIVE
Protein, ur: NEGATIVE mg/dL
SPECIFIC GRAVITY, URINE: 1.025 (ref 1.005–1.030)
UROBILINOGEN UA: 1 mg/dL (ref 0.0–1.0)
pH: 6 (ref 5.0–8.0)

## 2015-01-01 LAB — URINE MICROSCOPIC-ADD ON

## 2015-01-01 LAB — LIPASE, BLOOD: LIPASE: 28 U/L (ref 11–59)

## 2015-01-01 MED ORDER — IOHEXOL 300 MG/ML  SOLN
100.0000 mL | Freq: Once | INTRAMUSCULAR | Status: AC | PRN
  Administered 2015-01-01: 100 mL via INTRAVENOUS

## 2015-01-01 MED ORDER — MORPHINE SULFATE 2 MG/ML IJ SOLN
4.0000 mg | Freq: Once | INTRAMUSCULAR | Status: AC
  Administered 2015-01-01: 4 mg via INTRAMUSCULAR

## 2015-01-01 MED ORDER — MORPHINE SULFATE 2 MG/ML IJ SOLN
INTRAMUSCULAR | Status: AC
  Filled 2015-01-01: qty 1

## 2015-01-01 MED ORDER — IOHEXOL 300 MG/ML  SOLN
25.0000 mL | Freq: Once | INTRAMUSCULAR | Status: AC | PRN
  Administered 2015-01-01: 25 mL via ORAL

## 2015-01-01 MED ORDER — HYDROMORPHONE HCL 1 MG/ML IJ SOLN
1.0000 mg | Freq: Once | INTRAMUSCULAR | Status: DC
  Filled 2015-01-01: qty 1

## 2015-01-01 MED ORDER — METOCLOPRAMIDE HCL 5 MG/ML IJ SOLN
10.0000 mg | Freq: Once | INTRAMUSCULAR | Status: AC
Start: 2015-01-01 — End: 2015-01-01
  Administered 2015-01-01: 10 mg via INTRAVENOUS
  Filled 2015-01-01: qty 2

## 2015-01-01 MED ORDER — HYDROMORPHONE HCL 1 MG/ML IJ SOLN
1.0000 mg | Freq: Once | INTRAMUSCULAR | Status: AC
  Administered 2015-01-01: 1 mg via INTRAVENOUS
  Filled 2015-01-01: qty 1

## 2015-01-01 MED ORDER — GI COCKTAIL ~~LOC~~
30.0000 mL | Freq: Once | ORAL | Status: AC
  Administered 2015-01-01: 30 mL via ORAL
  Filled 2015-01-01: qty 30

## 2015-01-01 MED ORDER — ONDANSETRON HCL 4 MG/2ML IJ SOLN
INTRAMUSCULAR | Status: AC
  Filled 2015-01-01: qty 2

## 2015-01-01 MED ORDER — OXYCODONE-ACETAMINOPHEN 5-325 MG PO TABS: 1.0000 | ORAL_TABLET | Freq: Four times a day (QID) | ORAL | Status: DC | PRN

## 2015-01-01 MED ORDER — ONDANSETRON HCL 4 MG/2ML IJ SOLN
4.0000 mg | Freq: Once | INTRAMUSCULAR | Status: AC
  Administered 2015-01-01: 4 mg via INTRAMUSCULAR

## 2015-01-01 NOTE — ED Notes (Signed)
Pt able to tolerate fluids 

## 2015-01-01 NOTE — Telephone Encounter (Signed)
Patient has been transferred to the ER at [facility].  Still being worked up.  Diff diagnosis is diverticulitis, SBO vs perforation.  I will await a return call from the patient

## 2015-01-01 NOTE — ED Provider Notes (Signed)
CSN: [id number]     Arrival date & time 01/01/15  5009 History   First MD Initiated Contact with Patient 01/01/15 [phone]     Chief Complaint  Patient presents with  . Abdominal Pain   (Consider location/radiation/quality/duration/timing/severity/associated sxs/prior Treatment) HPI Comments: Lisa [name] is a pleasant 54 year old African-American female known to Dr. Fuller Plan (gastroenterology) who has history of hypertension, sleep apnea, tobacco abuse, morbid obesity, and diverticular disease. She is status post cholecystectomy, c-sect x 2, BTL, laparoscopy, endometrial ablation, umbilical hernia repair, hiatal hernia. Carries diagnoses of GERD and IBS.  She had undergone an EGD in 2013 as well as colonoscopy. The EGD was normal, endoscopy pertinent for diverticulosis of the left colon and 2 small hyperplastic polyps. Has upper endoscopy for evaluation of melena in Dec. 2015 that was consistent with gastritis.  Presents today with complaints of abdominal pain. States pain began abruptly at her LLQ yesterday evening with constant pain and periods of intensification since symptoms began. LNBM: last night. Denies fever, nausea, vomiting, diarrhea. Denies GU issues. Endorses known hx of microscopic hematuria that was determined to be benign by her urologist (Dr. Risa Grill). Has tried taking tylenol with little relief. Pain intensifies with deep inspiration, cough, and bumps in the road while driving to [facility]. Is menopausal.  Patient is a 54 y.o. female presenting with abdominal pain. The history is provided by the patient.  Abdominal Pain Associated symptoms: no constipation, no diarrhea, no nausea and no vomiting     Past Medical History  Diagnosis Date  . Hypertension   . Chest pain, non-cardiac   . Obesity   . Cigarette smoker   . IBS (irritable bowel syndrome)   . GERD (gastroesophageal reflux disease)   . Diverticulosis   . Pyloric erosion   . Allergic rhinitis   . Sleep apnea     CPAP setting varies  from 4-10  . Chronic headaches   . Breast nodule     right breast, to see dr Helane Rima 06-20-2012 for   . Complication of anesthesia 05-17-2012    trouble breathing after colonscopy, needed nebulizer  . Hepatic hemangioma   . Asthma    Past Surgical History  Procedure Laterality Date  . Cesarean section  2003, 2009    x 2  . Laparoscopy    . Umbilical hernia repair  2004  . US echocardiography  05/08/2008    EF 55-60%  . Cardiovascular stress test  03/25/2007    EF 78%  . Endometrial ablation  09/2010    and D&C  . Cardiac catheterization  12/28/2007    EF 75%. IT REVEALS NORMAL/SUPRANORMAL LEFT VENTRICULAR SYSTOLIC FUNCTION  . Cholecystectomy  1993  . Hernia repair  2993    umbilical  . Ventral hernia repair  06/15/2012    Procedure: LAPAROSCOPIC VENTRAL HERNIA;  Surgeon: Edward Jolly, MD;  Location: [facility];  Service: General;  Laterality: N/A;  LAPAROSCOPIC REPAIR VENTRAL HERNIA  . Colonoscopy  2014  . Tubal ligation     Family History  Problem Relation Age of Onset  . Hypertension Mother   . Diabetes Maternal Grandmother   . Colon cancer Neg Hx   . Lung cancer Mother   . Heart disease Father   . Heart attack Paternal Uncle   . Breast cancer     History  Substance Use Topics  . Smoking status: Current Every Day Smoker -- 1.00 packs/day for 25 years    Types: Cigarettes  . Smokeless tobacco: Never Used  Comment: form given 10/19/14  . Alcohol Use: 0.0 oz/week    0 Not specified per week     Comment: very rare   OB History    No data available     Review of Systems  Constitutional: Positive for appetite change.  Respiratory: Negative.   Cardiovascular: Negative.   Gastrointestinal: Positive for abdominal pain. Negative for nausea, vomiting, diarrhea and constipation.  Genitourinary: Negative.   Musculoskeletal: Negative.   Skin: Negative.   Neurological: Negative for dizziness and syncope.    Allergies  Hctz; Lisinopril; Morphine and related; Other;  and Percocet  Home Medications   Prior to Admission medications   Medication Sig Start Date End Date Taking? Authorizing Provider  acetaminophen (TYLENOL) 500 MG tablet Take 1,000 mg by mouth every 6 (six) hours as needed. Pain    Historical Provider, MD  amLODipine (NORVASC) 10 MG tablet TAKE 1 TABLET (10 MG TOTAL) BY MOUTH DAILY. 09/11/14   Thayer Headings, MD  aspirin EC 81 MG tablet Take 1 tablet (81 mg total) by mouth daily. Patient not taking: Reported on 11/13/2014 03/07/14   Burtis Junes, NP  carvedilol (COREG) 25 MG tablet Take 1 tablet (25 mg total) by mouth 2 (two) times daily. 03/19/14   Thayer Headings, MD  dicyclomine (BENTYL) 10 MG capsule Take 1 capsule (10 mg total) by mouth 3 (three) times daily as needed for spasms. 11/13/14   Ladene Artist, MD  furosemide (LASIX) 40 MG tablet Take 1 tablet (40 mg total) by mouth daily. 03/19/14   Thayer Headings, MD  losartan (COZAAR) 100 MG tablet TAKE 1 TABLET (100 MG TOTAL) BY MOUTH DAILY. 11/12/14   Thayer Headings, MD  omeprazole (PRILOSEC) 40 MG capsule Take 1 capsule (40 mg total) by mouth daily. 11/13/14   Ladene Artist, MD  potassium chloride (K-DUR) 10 MEQ tablet Take 1 tablet (10 mEq total) by mouth 2 (two) times daily. 09/26/14   Thayer Headings, MD   BP 109/70 mmHg  Pulse 70  Temp(Src) 98.4 F (36.9 C) (Oral)  Resp 16  SpO2 98% Physical Exam  Constitutional: She is oriented to person, place, and time. She appears well-developed and well-nourished. No distress.  HENT:  Head: Normocephalic and atraumatic.  Eyes: Conjunctivae are normal. No scleral icterus.  Cardiovascular: Normal rate, regular rhythm and normal heart sounds.   Pulmonary/Chest: Effort normal and breath sounds normal.  Abdominal: Normal appearance. She exhibits no mass. Bowel sounds are decreased. There is tenderness in the left lower quadrant. There is rebound and guarding. There is no CVA tenderness.  +obese  Musculoskeletal: Normal range of motion.   Neurological: She is alert and oriented to person, place, and time.  Skin: Skin is warm and dry.  Psychiatric: She has a normal mood and affect. Her behavior is normal.  Nursing note and vitals reviewed.   ED Course  Procedures (including critical care time) Labs Review Labs Reviewed  POCT URINALYSIS DIP (DEVICE) - Abnormal; Notable for the following:    Hgb urine dipstick MODERATE (*)    All other components within normal limits    Imaging Review No results found.   MDM   1. Left lower quadrant pain   54 y/o female with know left colonic diverticulosis now with abrupt onset LLQ abdominal pain over the past 24 hours. Exam concerning for diverticulitis or perforated diverticulum. Patient given 4mg  IM Zofran and 4 mg IM morphine while at [facility] and transferred to [facility]  ER via shuttle for further evaluation and management.  UA unremarkable   Lutricia Feil, [place] 01/01/15 1027

## 2015-01-01 NOTE — ED Notes (Signed)
Pt presents to department for evaluation of LLQ abdominal pain. Onset Monday evening. 8/10 pain upon arrival to ED. Pt is alert and oriented x4.

## 2015-01-01 NOTE — Discharge Instructions (Signed)
°Emergency Department Resource Guide °1) Find a Doctor and Pay Out of Pocket °Although you won't have to find out who is covered by your insurance plan, it is a good idea to ask around and get recommendations. You will then need to call the office and see if the doctor you have chosen will accept you as a new patient and what types of options they offer for patients who are self-pay. Some doctors offer discounts or will set up payment plans for their patients who do not have insurance, but you will need to ask so you aren't surprised when you get to your appointment. ° °2) Contact Your Local Health Department °Not all health departments have doctors that can see patients for sick visits, but many do, so it is worth a call to see if yours does. If you don't know where your local health department is, you can check in your phone book. The CDC also has a tool to help you locate your state's health department, and many state websites also have listings of all of their local health departments. ° °3) Find a Walk-in Clinic °If your illness is not likely to be very severe or complicated, you may want to try a walk in clinic. These are popping up all over the country in pharmacies, drugstores, and shopping centers. They're usually staffed by nurse practitioners or physician assistants that have been trained to treat common illnesses and complaints. They're usually fairly quick and inexpensive. However, if you have serious medical issues or chronic medical problems, these are probably not your best option. ° °No Primary Care Doctor: °- Call [facility] Connect at  832-8000 - they can help you locate a primary care doctor that  accepts your insurance, provides certain services, etc. °- Physician Referral Service- 1-800-533-3463 ° °Chronic Pain Problems: °Organization         Address  Phone   Notes  °[facility]  (336) 297-2271 Patients need to be referred by their primary care doctor.  ° °Medication  Assistance: °Organization         Address  Phone   Notes  °[place] [facility] [address] °[place], [place] [address] (336) 641-8030 --Must be a resident of [place] °-- Must have NO insurance coverage whatsoever (no Medicaid/ Medicare, etc.) °-- The pt. MUST have a primary care doctor that directs their care regularly and follows them in the community °  °[facility]  (866) 331-1348   °[employer]  (888) 892-1162   ° °Agencies that provide inexpensive medical care: °Organization         Address  Phone   Notes  °[facility]  (336) 832-8035   °[facility]    (336) 832-7272   °[facility] [address] °[place], [place] [address] (336) 832-4777   °[facility] [address], °[place] (336) 271-4999   °[facility]    (336) 373-0678   °[facility]    (336) 272-1050   °[facility] ° [address], [place] Phone:  (336) 832-4444, Fax:  (336) 832-4440 Hours of Operation:  9 am - 6 pm, M-F.  Also accepts Medicaid/Medicare and self-pay.  °[facility] ° [address], [place] Phone: (336) 832-3150, Fax: (336) 832-3151. Hours of Operation:  8:30 am - 5:30 pm, M-F.  Also accepts Medicaid and self-pay.  °[facility] [place] [address], [place] Phone: (336) 878-6027   °[facility] [address], [place], [place] (336)723-1848, Ext. 123 Mondays & Thursdays: 7-9 AM.  First 15 patients are seen on a first come, first serve basis. °  ° °Medicaid-accepting [place] Providers: ° °Organization         Address  Phone   Notes  °[facility] [address], [place] (336) 641-2100 Also accepts self-pay patients.  °[facility] [address], [place] ° (336) 856-9996   °[facility] [address], [place]  (336) 288-8857   °[facility] [address] [place] [address], [place] (336) 299-7000   °Veita Bland [address], [place]  ° (336) 373-1557 Only accepts [place] Access Medicaid patients after they have their name applied to their card.  ° °Self-Pay (no insurance) in [place]: ° °Organization         Address  Phone   Notes  °Sickle Cell Patients, [facility] [address], [place] (336) 832-1970   °[facility] [address], [place] (336) 832-4400   °[facility] ° [address], [place] (336) 992-4800   °[facility]/Dr. Osei-Bonsu ° [address] [place] [address], [place] or [address], [place] (336) 841-8500 Phone number for both [place] and [place] locations is the same.  °[facility] [address], [place] (336) 299-0000   °[facility] [address] [place] [address], [place] or [address] (336) 852-7530 °(336) 878-2260   °[facility] [address], [place] (336) 350-1642, phone; (336) 294-5005, fax Sees patients 1st and 3rd Saturday of every month.  Must not qualify for public or private insurance (i.e. Medicaid, Medicare, [place] Health Choice, Veterans' Benefits) • Household income should be no more than 200% of the poverty level •The clinic cannot treat you if you are pregnant or think you are pregnant • Sexually transmitted diseases are not treated at the clinic.  ° ° °Dental Care: °Organization         Address  Phone  Notes  °[place] [facility] [address], [place] (336) 641-6152 Accepts children up to age 21 who are enrolled in Medicaid or [place] Health Choice; pregnant women with a Medicaid card; and children who have applied for Medicaid or [place] Health Choice, but were declined, whose parents can pay a reduced fee at time of service.  °[place]  [facility] [place]  [address], [place] (336) 641-7733 Accepts children up to age 21 who are enrolled in Medicaid or [place] Health Choice; pregnant women with a Medicaid card; and children who have applied for Medicaid or [place] Health Choice, but were declined, whose parents can pay a reduced fee at time of service.  °[facility] ° [address],  (336) 641-4533 Patients are seen by appointment only. Walk-ins are not accepted. [facility] will see patients 18 years of age and older. °Monday - Tuesday (8am-5pm) °Most Wednesdays (8:30-5pm) °$30 per visit, cash only  °[facility] ° [address], [place] (336) 641-4533 Patients are seen by appointment only. Walk-ins are not accepted. [facility] will see patients 18 years of age and older. °One   Wednesday Evening (Monthly: Volunteer Based).  $30 per visit, cash only  °[facility]  (919) 537-3737 for adults; Children under age 4, call Graduate Pediatric Dentistry at (919) [phone]. Children aged 4-14, please call (919) [phone] to request a pediatric application. ° Dental services are provided in all areas of dental care including fillings, crowns and bridges, complete and partial dentures, implants, gum treatment, root canals, and extractions. Preventive care is also provided. Treatment is provided to both adults and children. °Patients are selected via a lottery and there is often a waiting list. °  °[facility] [address], °[place] ° (336) 763-8833 [email or web address] °  °[facility] [address], [place], [place] (336)723-1848, Ext. 123 Second and Fourth Thursday of each month, opens at 6:30 AM; Clinic ends at 9 AM.  Patients are seen on a first-come first-served basis, and a limited number are seen during each clinic.  ° °[facility] ° [address], [place], [place] (336) 723-7904    Eligibility Requirements °You must have lived in [place], [place], or [place] counties for at least the last three months. °  You cannot be eligible for state or federal sponsored healthcare insurance, including Veterans Administration, Medicaid, or Medicare. °  You generally cannot be eligible for healthcare insurance through your employer.  °  How to apply: °Eligibility screenings are held every Tuesday and Wednesday afternoon from 1:00 pm until 4:00 pm. You do not need an appointment for the interview!  °[facility] [address], [place], [place] 336-631-2330   °[facility]  336-342-8273   °[facility]  336-703-3100   °[facility]  336-570-6415   ° °Behavioral Health Resources in the Community: °Intensive Outpatient Programs °Organization         Address  Phone  Notes  °[facility] [address], [place], [place] 336-878-6098   °[facility] [address], [place], [place] 336-832-9800   °[facility] [address], [place], [place] ° 336-882-2125   °[facility] [address],  °[place], [place] 1-800-853-5163 or 336-641-4981   °Substance Abuse Resources °Organization         Address  Phone  Notes  °[facility]  336-882-2125   °[facility]  336-784-9470   °[facility]  336-285-9073   °[facility]  336-845-3988   °Residential & Outpatient Substance Abuse Program  1-800-659-3381   °Psychological Services °Organization         Address  Phone  Notes  °[facility]  336- 832-9600   °[facility]  336- 378-7881   °[facility] [address], [place] 1-800-853-5163 or 336-641-4981   ° °Mobile Crisis Teams °Organization         Address  Phone  Notes  °[facility], Mobile Crisis Care Unit  1-877-626-1772   °Assertive °[facility] ° [address]  [place], [place] 336-834-9664   °Sharon DeEsch [address] °[place] 336-554-5454   ° °Self-Help/Support Groups °Organization         Address  Phone             Notes  °[facility] - variety of support groups  336- 373-1402 Call for more information  °Narcotics Anonymous (NA), [facility] [address], °[place]  2 meetings at this location  ° °  Residential Treatment Programs °Organization         Address  Phone  Notes  °[facility] [address],    °[place]  1-866-801-8205   °[facility] ° [name], [place], [place] 704-293-8524   °[facility] [address], [place] 336-845-3988 Admissions: 8am-3pm M-F  °[facility] [address]    °[place], [place] 336-841-1104   °[facility] [address], [place], [name] 336-379-7146   °[facility] [address]  °[place], [place] 336-285-9073   °[facility] - Intensive Outpatient [address], [place], [place] 336-852-3033   °[facility] [address]  °[place], [place] 1-877-615-2722 or 336-784-9470   °[facility] [address] [place], [place] 336-227-7417 Accepts Medicaid  °[facility] [address]  ° [place] 1-800-659-3381 Substance Abuse/Addiction Treatment  ° °[place] Behavioral Health Resources °Organization         Address  Phone  Notes  °[facility]  (888) 581-9988   °Julie Brannon, PhD [address] [place], [place]   (336) 349-5553 or (336) 951-0000   °[facility]   [address] °[place], [place] (336) 349-4454   °[facility] [address], [place], [place] (336) 342-8316 Insurance/Medicaid/sponsorship through [facility]  °[facility] [address]                                    [place], [place] (336) 342-8316 Therapy/tele-psych/case    °[facility] [address]  ° [place], [place] (336) 349-2233    °Dr. Arfeen  (336) 349-4544   °[facility] [place]  [employer] [place] [facility] 1) [address], [place] °2) [address], [place] °3)  [address], [place] (336) 349-3220 °(336) 342-7768 ° °(336) 342-8140   °[place] Child Abuse [facility] (336) 342-1394 or (336) 342-3537 (After Hours)    ° ° °

## 2015-01-01 NOTE — Telephone Encounter (Signed)
I spoke with the patient and she is in [facility].  She is advised to have them evaluate her and then she can call back if she needs GI care

## 2015-01-01 NOTE — ED Notes (Signed)
Reports sudden onset of abdominal pain on 12/31/14.  Denies changes in urination routine.  Pain is lower left abdomen, initially was on side of torso, then radiated to lower, left abdomen.  Pain fluctuates, variable intensity.  Last bm was last night.  No vomiting, no nausea, no fever

## 2015-01-01 NOTE — ED Provider Notes (Signed)
CSN: [id number]     Arrival date & time 01/01/15  1045 History   First MD Initiated Contact with Patient 01/01/15 1103     Chief Complaint  Patient presents with  . Abdominal Pain     (Consider location/radiation/quality/duration/timing/severity/associated sxs/prior Treatment) HPI  Pt is a 54yo obese female with hx of HTN, IBS, GERD, diverticulosis, pyloric erosion, hepatic hemangioma and abdominal surgical hx significant for 2 c-sections, laparoscopy, umbilical hernia repair, endometrial ablation, tubal ligation, cholecystectomy, umbilical hernia repair, and ventral hernia repair in 2013 by Dr. Excell Seltzer, presenting to ED from urgent care for further evaluation of LLQ abdominal pain that started yesterday. Pain has been constant since onset, waxing and waning in severity, worse with palpation, ambulation, and even while riding in the car on the way to urgent care and to the ED.  Pain is aching, cramping, sharp, and "crushing" in nature, 9/10 at worst. Pt received IM zofran and morphine PTA w/o relief in pain.  Denies urinary or vaginal symptoms.  Denies hx of similar pain. Last BM was last night, normal for pt.  Denies fever, chills, nausea or vomiting.  Pt reports having an endoscopy in Dec 2015, c/w gastritis.    GI: Dr. Fuller Plan  Past Medical History  Diagnosis Date  . Hypertension   . Chest pain, non-cardiac   . Obesity   . Cigarette smoker   . IBS (irritable bowel syndrome)   . GERD (gastroesophageal reflux disease)   . Diverticulosis   . Pyloric erosion   . Allergic rhinitis   . Sleep apnea     CPAP setting varies from 4-10  . Chronic headaches   . Breast nodule     right breast, to see dr Helane Rima 06-20-2012 for   . Complication of anesthesia 05-17-2012    trouble breathing after colonscopy, needed nebulizer  . Hepatic hemangioma   . Asthma    Past Surgical History  Procedure Laterality Date  . Cesarean section  2003, 2009    x 2  . Laparoscopy    . Umbilical hernia repair   2004  . US echocardiography  05/08/2008    EF 55-60%  . Cardiovascular stress test  03/25/2007    EF 78%  . Endometrial ablation  09/2010    and D&C  . Cardiac catheterization  12/28/2007    EF 75%. IT REVEALS NORMAL/SUPRANORMAL LEFT VENTRICULAR SYSTOLIC FUNCTION  . Cholecystectomy  1993  . Hernia repair  9562    umbilical  . Ventral hernia repair  06/15/2012    Procedure: LAPAROSCOPIC VENTRAL HERNIA;  Surgeon: Edward Jolly, MD;  Location: [facility];  Service: General;  Laterality: N/A;  LAPAROSCOPIC REPAIR VENTRAL HERNIA  . Colonoscopy  2014  . Tubal ligation     Family History  Problem Relation Age of Onset  . Hypertension Mother   . Diabetes Maternal Grandmother   . Colon cancer Neg Hx   . Lung cancer Mother   . Heart disease Father   . Heart attack Paternal Uncle   . Breast cancer     History  Substance Use Topics  . Smoking status: Current Every Day Smoker -- 1.00 packs/day for 25 years    Types: Cigarettes  . Smokeless tobacco: Never Used     Comment: form given 10/19/14  . Alcohol Use: 0.0 oz/week    0 Not specified per week     Comment: very rare   OB History    No data available     Review of  Systems  Constitutional: Positive for appetite change. Negative for fever, chills and diaphoresis.  Respiratory: Negative for cough and shortness of breath.   Cardiovascular: Negative for chest pain and palpitations.  Gastrointestinal: Positive for abdominal pain (LLQ). Negative for nausea, vomiting, diarrhea and constipation.  Genitourinary: Positive for flank pain ( left). Negative for dysuria, urgency, frequency, hematuria, decreased urine volume, vaginal bleeding, vaginal discharge, vaginal pain and pelvic pain.  Musculoskeletal: Negative for myalgias and back pain.  All other systems reviewed and are negative.     Allergies  Hctz; Lisinopril; Morphine and related; Other; and Percocet  Home Medications   Prior to Admission medications   Medication Sig Start  Date End Date Taking? Authorizing Provider  acetaminophen (TYLENOL) 500 MG tablet Take 1,000 mg by mouth every 6 (six) hours as needed. Pain    Historical Provider, MD  amLODipine (NORVASC) 10 MG tablet TAKE 1 TABLET (10 MG TOTAL) BY MOUTH DAILY. 09/11/14   Thayer Headings, MD  aspirin EC 81 MG tablet Take 1 tablet (81 mg total) by mouth daily. Patient not taking: Reported on 11/13/2014 03/07/14   Burtis Junes, NP  carvedilol (COREG) 25 MG tablet Take 1 tablet (25 mg total) by mouth 2 (two) times daily. 03/19/14   Thayer Headings, MD  dicyclomine (BENTYL) 10 MG capsule Take 1 capsule (10 mg total) by mouth 3 (three) times daily as needed for spasms. 11/13/14   Ladene Artist, MD  furosemide (LASIX) 40 MG tablet Take 1 tablet (40 mg total) by mouth daily. 03/19/14   Thayer Headings, MD  losartan (COZAAR) 100 MG tablet TAKE 1 TABLET (100 MG TOTAL) BY MOUTH DAILY. 11/12/14   Thayer Headings, MD  omeprazole (PRILOSEC) 40 MG capsule Take 1 capsule (40 mg total) by mouth daily. 11/13/14   Ladene Artist, MD  oxyCODONE-acetaminophen (PERCOCET/ROXICET) 5-325 MG per tablet Take 1-2 tablets by mouth every 6 (six) hours as needed for moderate pain or severe pain. 01/01/15   Noland Fordyce, PA-C  potassium chloride (K-DUR) 10 MEQ tablet Take 1 tablet (10 mEq total) by mouth 2 (two) times daily. 09/26/14   Thayer Headings, MD   BP 116/74 mmHg  Pulse 63  Temp(Src) 98 F (36.7 C) (Oral)  Resp 16  SpO2 97% Physical Exam  Constitutional: She appears well-developed and well-nourished. She appears distressed.  Obese female lying in exam bed, appears uncomfortable, holding her LLQ of abdomen.  HENT:  Head: Normocephalic and atraumatic.  Eyes: Conjunctivae are normal. No scleral icterus.  Neck: Normal range of motion.  Cardiovascular: Normal rate, regular rhythm and normal heart sounds.   Pulmonary/Chest: Effort normal and breath sounds normal. No respiratory distress. She has no wheezes. She has no rales. She  exhibits no tenderness.  Abdominal: Soft. She exhibits no mass. Bowel sounds are decreased. There is tenderness. There is rebound and guarding. There is no CVA tenderness.  Obese abdomen, decreased bowel sounds, soft, tenderness with rebound and guarding in LLQ. No masses palpated. No CVAT  Musculoskeletal: Normal range of motion.  Neurological: She is alert.  Skin: Skin is warm and dry. She is not diaphoretic.  Nursing note and vitals reviewed.   ED Course  Procedures (including critical care time) Labs Review Labs Reviewed  COMPREHENSIVE METABOLIC PANEL - Abnormal; Notable for the following:    Glucose, Bld 129 (*)    GFR calc non Af Amer 77 (*)    GFR calc Af Amer 89 (*)    All  other components within normal limits  URINALYSIS, ROUTINE W REFLEX MICROSCOPIC - Abnormal; Notable for the following:    Hgb urine dipstick MODERATE (*)    All other components within normal limits  URINE MICROSCOPIC-ADD ON - Abnormal; Notable for the following:    Bacteria, UA FEW (*)    All other components within normal limits  CBC WITH DIFFERENTIAL/PLATELET  LIPASE, BLOOD    Imaging Review Ct Abdomen Pelvis W Contrast  01/01/2015   CLINICAL DATA:  Left lower quadrant abdominal pain starting yesterday.  EXAM: CT ABDOMEN AND PELVIS WITH CONTRAST  TECHNIQUE: Multidetector CT imaging of the abdomen and pelvis was performed using the standard protocol following bolus administration of intravenous contrast.  CONTRAST:  157mL OMNIPAQUE IOHEXOL 300 MG/ML  SOLN  COMPARISON:  08/25/2013  FINDINGS: BODY WALL: Similar appearance of nodular densities in the inferior right breast. Evidence of mammography 10/16/2014 (although images not available for review). Previous ventral abdominal wall hernia repair.  LOWER CHEST:  There is new bilateral hilar lymphadenopathy, not seen in the lower chest 2014 and not present on chest CT from 2009. Mosaic attenuation of the lower lungs consistent with air trapping or less likely  small vessels disease. There is a small nodule in the right middle lobe which is stable since at least 2008.  ABDOMEN/PELVIS:  Liver: Stable 2 cm subcapsular hemangioma in the high right liver.  Biliary: Cholecystectomy. Mild enlargement of the biliary tree is stable over multiple studies.  Pancreas: Unremarkable.  Spleen: Unremarkable.  Adrenals: Unremarkable.  Kidneys and ureters: No hydronephrosis or stone. Small presumed cysts in the left renal cortex .  Bladder: Unremarkable.  Reproductive: Unremarkable.  Bowel: There is extensive colonic diverticulosis but no active inflammation. Mobile cecum with appendix deep to the umbilicus, negative for appendicitis. Small bowel is adhered to a midline abdominal hernia repair. No small bowel obstruction.  Retroperitoneum: No mass or adenopathy.  Peritoneum: No ascites or pneumoperitoneum.  Vascular: No acute abnormality.  OSSEOUS: No acute abnormalities.  IMPRESSION: 1. No acute findings to explain abdominal pain. 2. Bilateral hilar lymphadenopathy and lower lobe airway disease. Nonemergent chest CT followup recommended. 3. Colonic diverticulosis.   Electronically Signed   By: Jorje Guild M.D.   On: 01/01/2015 13:51     EKG Interpretation None      MDM   Final diagnoses:  LLQ abdominal pain  Hilar lymphadenopathy    Pt is a 54yo female with hx of IBS and multiple abdominal surgeries including 2 hernia repairs, last repair 2013 by Dr. Excell Seltzer.  Abdominal exam limited by body habitus, however, pt has significant tenderness with rebound and guarding in LLQ, concerning for diverticulitis, vs SBO, vs perforation.   Pt given morphine and zofran at UC, no relief.  Pain did improve with 1mg  IV dilaudid.   Labs: CBC, CMP, and lipase unremarkable, however, due to severity of pain, CT abdomen was ordered.  Pt NPO at this time.  12:56 PM Pain is 2/10 at this time.  Will reevaluate once CT performed.  UA: significant for moderate Hgb, however, pt reports hx of  hematuria, has been worked up in the past by Dr. Risa Grill, determined to be benign.   CT Abd/Pelvis: no acute findings to explain abdominal pain, however, there is small bowel adhered to midline abdominal hernia repair that may account for some pain.  Also significant for bilateral hilar lymphadenopathy and lower lobe airway disease, recommend non-emergent chest CT follow-up. Colonic diverticulosis w/o diverticulitis.   2:51 PM Reviewed labs  and imaging with pt. Pt states she is still in pain, tearful. Will given another dose of dilaudid and add a GI cocktail and reglan. Will also try fluid challenge.    3:40 PM Pt able to keep down several ounces of PO fluids. Pt is hemodynamically stable for discharge home.  Advised to f/u with her PCP for recheck of abdominal pain and to order a non-emergent f/u chest CT for the hilar lymphadenopathy, as well as Dr. Fuller Plan her GI specialist for recheck of abdominal pain.  [employer] provided as pt states her PCP recently advised her they are only urgent care.  Return precautions provided. Pt verbalized understanding and agreement with tx plan.    Noland Fordyce, PA-C 01/01/15 [place] Alvino Chapel, MD 01/02/15 [phone]

## 2015-01-02 ENCOUNTER — Telehealth: Payer: Self-pay | Admitting: Gastroenterology

## 2015-01-02 NOTE — Telephone Encounter (Signed)
Patient states that she does not need to see Dr. Fuller Plan she wants to see the surgeon that performed her hernia repair.  She will call back if she desires an appt after she sees the Psychologist, sport and exercise.  She thanked me for the call.

## 2015-01-18 ENCOUNTER — Ambulatory Visit (INDEPENDENT_AMBULATORY_CARE_PROVIDER_SITE_OTHER): Payer: Managed Care, Other (non HMO) | Admitting: Emergency Medicine

## 2015-01-18 ENCOUNTER — Other Ambulatory Visit: Payer: Self-pay | Admitting: Emergency Medicine

## 2015-01-18 ENCOUNTER — Encounter: Payer: Self-pay | Admitting: Emergency Medicine

## 2015-01-18 VITALS — BP 130/66 | HR 80 | Wt 274.0 lb

## 2015-01-18 DIAGNOSIS — R590 Localized enlarged lymph nodes: Secondary | ICD-10-CM | POA: Insufficient documentation

## 2015-01-18 DIAGNOSIS — J449 Chronic obstructive pulmonary disease, unspecified: Secondary | ICD-10-CM

## 2015-01-18 DIAGNOSIS — R0602 Shortness of breath: Secondary | ICD-10-CM

## 2015-01-18 DIAGNOSIS — R599 Enlarged lymph nodes, unspecified: Secondary | ICD-10-CM

## 2015-01-18 DIAGNOSIS — G4730 Sleep apnea, unspecified: Secondary | ICD-10-CM

## 2015-01-18 NOTE — Progress Notes (Signed)
Subjective:    Patient ID: Lisa [name], female    DOB: 01-30-1961, 54 y.o.   MRN: [id number]  Shortness of Breath Pertinent negatives include no ear pain, fever, headaches, leg swelling, rash, rhinorrhea, sore throat, vomiting or wheezing.   54 yo smoker (30-40 pk-yrs) with OSA (on CPAP), allergies, HTN, non-cardiac CP, GERD and IBS. Followed and referred by L. Gerhardt with cardiology.  She has been seen for dyspnea. Chemical myocardial perfusion scan 4/7 > small reversible defer distal LAD distribution.  CXR 3/17 normal.  She is referred here to further eval her SOB. She has exertional muscle fatigue. No wheeze. Some cough, bothers her after a URI or with allergies. Also has GERD that isn't reliably treated. She used to be on lisinopril but changed to losartan. She had PFT 3/'15 that show primarily restriction.   ROV 09/27/14 -- former smoker follows up for dyspnea, mixed disease on PFT. She has seen Dr Acie Fredrickson who recommended exercise, wt loss. Did not repeat her L cath at that time. She has been having trouble with leg cramps - increased her potassium. She is noticing some dyspnea with climbing stairs. Doesn't happen every time.  She is still smoking but has tried to cut down.    ROV 01/18/15 -- follow-up visit for tobacco use, mixed obstruction and restriction. She also has a history of obstructive sleep apnea and is in need of a repeat polysomnogram. She has been stable until about 3 weeks ago when she had a URI, still having some cough. She is still smoking, is tearful because she was unable to stop as we had discussed. She ash also had a CT scan abd 01/01/15 that showed some air trapping and mediastinal LAD. She is having some exertional SOB with ambulation.    Review of Systems  Constitutional: Negative for fever and unexpected weight change.  HENT: Negative for congestion, dental problem, ear pain, nosebleeds, postnasal drip, rhinorrhea, sinus pressure, sneezing, sore throat and trouble  swallowing.   Eyes: Negative for redness and itching.  Respiratory: Positive for shortness of breath. Negative for cough, chest tightness and wheezing.   Cardiovascular: Negative for palpitations and leg swelling.  Gastrointestinal: Negative for nausea and vomiting.  Genitourinary: Negative for dysuria.  Musculoskeletal: Negative for joint swelling.  Skin: Negative for rash.  Neurological: Negative for headaches.  Hematological: Does not bruise/bleed easily.  Psychiatric/Behavioral: Negative for dysphoric mood. The patient is not nervous/anxious.         Objective:   Physical Exam Filed Vitals:   01/18/15 1342 01/18/15 1343  BP:  130/66  Pulse:  80  Weight: 274 lb (124.286 kg)   SpO2:  97%   Gen: Pleasant, obese, in no distress,  normal affect  ENT: No lesions,  mouth clear, mild L ptosis  Neck: No JVD, no TMG, no carotid bruits  Lungs: No use of accessory muscles, clear without rales or rhonchi  Cardiovascular: RRR, heart sounds normal, no murmur or gallops, trace peripheral edema  Musculoskeletal: No deformities, no cyanosis or clubbing  Neuro: alert, non focal  Skin: Warm, no lesions or rashes     Assessment & Plan:  Mediastinal lymphadenopathy Seen on her abdominal CT scan 01/01/15, etiology unclear but this was in setting of a URI. Could represent sarcoidosis, etc.  - needs dedicated CT scan chest and then follow to review   COPD (chronic obstructive pulmonary disease) - trial of albuterol prn - discussed smoking cessation.    Sleep apnea She hasn't had a  repeat PSG yet.

## 2015-01-18 NOTE — Assessment & Plan Note (Signed)
-   trial of albuterol prn - discussed smoking cessation.

## 2015-01-18 NOTE — Patient Instructions (Signed)
We will perform a CT scan of your chest Please try taking albuterol 2 puffs as needed for shortness of breath or before exertion to see if this helps your breathing We agreed that you would cut down her cigarettes to 15 a day by our next visit. Work hard on this goal We will discuss a repeat sleep study at your next visit Follow with Dr Lamonte Sakai in 6 weeks or sooner if you have any problems

## 2015-01-18 NOTE — Assessment & Plan Note (Signed)
Seen on her abdominal CT scan 01/01/15, etiology unclear but this was in setting of a URI. Could represent sarcoidosis, etc.  - needs dedicated CT scan chest and then follow to review

## 2015-01-18 NOTE — Assessment & Plan Note (Signed)
She hasn't had a repeat PSG yet.

## 2015-01-21 ENCOUNTER — Telehealth: Payer: Self-pay | Admitting: Emergency Medicine

## 2015-01-21 NOTE — Telephone Encounter (Signed)
lmtcb x1 

## 2015-01-21 NOTE — Telephone Encounter (Signed)
Pt cb, [id number]

## 2015-01-21 NOTE — Telephone Encounter (Signed)
lmtcb X2 for pt.  

## 2015-01-22 NOTE — Telephone Encounter (Signed)
lmtcb for pt.  

## 2015-01-23 NOTE — Telephone Encounter (Signed)
lmtcb x4 

## 2015-01-24 ENCOUNTER — Ambulatory Visit (INDEPENDENT_AMBULATORY_CARE_PROVIDER_SITE_OTHER)
Admission: RE | Admit: 2015-01-24 | Discharge: 2015-01-24 | Disposition: A | Discharge status: Home / Self Care | Payer: Managed Care, Other (non HMO) | Source: Ambulatory Visit | Attending: Emergency Medicine | Admitting: Emergency Medicine

## 2015-01-24 DIAGNOSIS — R0602 Shortness of breath: Secondary | ICD-10-CM

## 2015-01-24 MED ORDER — IOHEXOL 300 MG/ML  SOLN
75.0000 mL | Freq: Once | INTRAMUSCULAR | Status: AC | PRN
  Administered 2015-01-24: 75 mL via INTRAVENOUS

## 2015-01-24 NOTE — Telephone Encounter (Signed)
lmtcb for pt. Per triage protocol will sign off.  

## 2015-01-25 ENCOUNTER — Telehealth: Payer: Self-pay | Admitting: Emergency Medicine

## 2015-01-25 NOTE — Telephone Encounter (Signed)
Please let her know that I reviewed her Ct scan - it shows some slightly enlarged lymphnodes in the middle of the chest that are stable compared with her abd CT. There is a small pulmonary nodule present that is likely a benign finding. We will need to follow over time to insure that it is not changing. There is nothing present that needs to be be evaluated right now - we can review further when we follow up in office.

## 2015-01-25 NOTE — Telephone Encounter (Signed)
Called and informed pt of the results per RB. Pt verbalized understanding and denied any further questions or concerns at this time.

## 2015-01-25 NOTE — Telephone Encounter (Signed)
Pt would like results of CT scan done on 01/24/15.  RB - please advise. Thanks.

## 2015-02-18 ENCOUNTER — Other Ambulatory Visit: Payer: Self-pay | Admitting: Cardiovascular Disease

## 2015-03-05 ENCOUNTER — Ambulatory Visit: Payer: Managed Care, Other (non HMO) | Admitting: Emergency Medicine

## 2015-03-14 ENCOUNTER — Encounter (HOSPITAL_BASED_OUTPATIENT_CLINIC_OR_DEPARTMENT_OTHER): Payer: Self-pay

## 2015-03-14 ENCOUNTER — Emergency Department (HOSPITAL_BASED_OUTPATIENT_CLINIC_OR_DEPARTMENT_OTHER): Payer: Managed Care, Other (non HMO)

## 2015-03-14 ENCOUNTER — Emergency Department (HOSPITAL_BASED_OUTPATIENT_CLINIC_OR_DEPARTMENT_OTHER)
Admission: EM | Admit: 2015-03-14 | Discharge: 2015-03-14 | Disposition: A | Discharge status: Home / Self Care | Payer: Managed Care, Other (non HMO) | Attending: Emergency Medicine | Admitting: Emergency Medicine

## 2015-03-14 DIAGNOSIS — M7521 Bicipital tendinitis, right shoulder: Secondary | ICD-10-CM

## 2015-03-14 DIAGNOSIS — K219 Gastro-esophageal reflux disease without esophagitis: Secondary | ICD-10-CM | POA: Insufficient documentation

## 2015-03-14 DIAGNOSIS — Z720 Tobacco use: Secondary | ICD-10-CM | POA: Diagnosis not present

## 2015-03-14 DIAGNOSIS — Z86018 Personal history of other benign neoplasm: Secondary | ICD-10-CM | POA: Insufficient documentation

## 2015-03-14 DIAGNOSIS — Z79899 Other long term (current) drug therapy: Secondary | ICD-10-CM | POA: Insufficient documentation

## 2015-03-14 DIAGNOSIS — M779 Enthesopathy, unspecified: Secondary | ICD-10-CM | POA: Insufficient documentation

## 2015-03-14 DIAGNOSIS — I10 Essential (primary) hypertension: Secondary | ICD-10-CM | POA: Insufficient documentation

## 2015-03-14 DIAGNOSIS — Z8669 Personal history of other diseases of the nervous system and sense organs: Secondary | ICD-10-CM | POA: Diagnosis not present

## 2015-03-14 DIAGNOSIS — Z9981 Dependence on supplemental oxygen: Secondary | ICD-10-CM | POA: Diagnosis not present

## 2015-03-14 DIAGNOSIS — M79601 Pain in right arm: Secondary | ICD-10-CM | POA: Diagnosis present

## 2015-03-14 DIAGNOSIS — Z8742 Personal history of other diseases of the female genital tract: Secondary | ICD-10-CM | POA: Diagnosis not present

## 2015-03-14 DIAGNOSIS — Z792 Long term (current) use of antibiotics: Secondary | ICD-10-CM | POA: Diagnosis not present

## 2015-03-14 DIAGNOSIS — J45909 Unspecified asthma, uncomplicated: Secondary | ICD-10-CM | POA: Diagnosis not present

## 2015-03-14 DIAGNOSIS — K589 Irritable bowel syndrome without diarrhea: Secondary | ICD-10-CM | POA: Insufficient documentation

## 2015-03-14 DIAGNOSIS — E669 Obesity, unspecified: Secondary | ICD-10-CM | POA: Diagnosis not present

## 2015-03-14 MED ORDER — HYDROCODONE-ACETAMINOPHEN 5-325 MG PO TABS
1.0000 | ORAL_TABLET | Freq: Once | ORAL | Status: AC
  Administered 2015-03-14: 1 via ORAL
  Filled 2015-03-14: qty 1

## 2015-03-14 MED ORDER — TRAMADOL HCL 50 MG PO TABS: 50.0000 mg | ORAL_TABLET | Freq: Four times a day (QID) | ORAL | Status: DC | PRN

## 2015-03-14 NOTE — ED Provider Notes (Signed)
CSN: [id number]     Arrival date & time 03/14/15  1100 History   First MD Initiated Contact with Patient 03/14/15 1152     Chief Complaint  Patient presents with  . Arm Pain     (Consider location/radiation/quality/duration/timing/severity/associated sxs/prior Treatment) HPI  Pt presenting with c/o right shoulder pain.  Pt states shoulder pain began yesterday when she tried to reach something from on a shelf- she states the pain was initially not as bad but has been worsening since yesterday.  At time of ED evaluation she states pain is unbearable and that she cannot move her arm due to pain.  No injury.  No swelling of arm or hand.  No neck pain.  No weakness of extremity. No chest pain.  Pt states she has had mild pain in that shoulder in the past but it would resolve in 1-2 days.  She has tried taking tylenol but had no relief from this.  Pain is constant and aching, but worse with movement and palpation.  There are no other associated systemic symptoms, there are no other alleviating or modifying factors.   Past Medical History  Diagnosis Date  . Hypertension   . Chest pain, non-cardiac   . Obesity   . Cigarette smoker   . IBS (irritable bowel syndrome)   . GERD (gastroesophageal reflux disease)   . Diverticulosis   . Pyloric erosion   . Allergic rhinitis   . Sleep apnea     CPAP setting varies from 4-10  . Chronic headaches   . Breast nodule     right breast, to see dr Helane Rima 06-20-2012 for   . Complication of anesthesia 05-17-2012    trouble breathing after colonscopy, needed nebulizer  . Hepatic hemangioma   . Asthma    Past Surgical History  Procedure Laterality Date  . Cesarean section  2003, 2009    x 2  . Laparoscopy    . Umbilical hernia repair  2004  . US echocardiography  05/08/2008    EF 55-60%  . Cardiovascular stress test  03/25/2007    EF 78%  . Endometrial ablation  09/2010    and D&C  . Cardiac catheterization  12/28/2007    EF 75%. IT REVEALS  NORMAL/SUPRANORMAL LEFT VENTRICULAR SYSTOLIC FUNCTION  . Cholecystectomy  1993  . Hernia repair  9417    umbilical  . Ventral hernia repair  06/15/2012    Procedure: LAPAROSCOPIC VENTRAL HERNIA;  Surgeon: Edward Jolly, MD;  Location: [facility];  Service: General;  Laterality: N/A;  LAPAROSCOPIC REPAIR VENTRAL HERNIA  . Colonoscopy  2014  . Tubal ligation     Family History  Problem Relation Age of Onset  . Hypertension Mother   . Diabetes Maternal Grandmother   . Colon cancer Neg Hx   . Lung cancer Mother   . Heart disease Father   . Heart attack Paternal Uncle   . Breast cancer     History  Substance Use Topics  . Smoking status: Current Every Day Smoker -- 0.50 packs/day for 25 years    Types: Cigarettes  . Smokeless tobacco: Never Used     Comment: form given 10/19/14  . Alcohol Use: 0.0 oz/week    0 Standard drinks or equivalent per week     Comment: very rare   OB History    No data available     Review of Systems  ROS reviewed and all otherwise negative except for mentioned in HPI  Allergies  Hctz; Lisinopril; Morphine and related; Other; and Percocet  Home Medications   Prior to Admission medications   Medication Sig Start Date End Date Taking? Authorizing Provider  acetaminophen (TYLENOL) 500 MG tablet Take 1,000 mg by mouth every 6 (six) hours as needed. Pain    Historical Provider, MD  amLODipine (NORVASC) 10 MG tablet TAKE 1 TABLET (10 MG TOTAL) BY MOUTH DAILY. 09/11/14   Thayer Headings, MD  carvedilol (COREG) 25 MG tablet Take 1 tablet (25 mg total) by mouth 2 (two) times daily. 03/19/14   Thayer Headings, MD  dicyclomine (BENTYL) 10 MG capsule Take 1 capsule (10 mg total) by mouth 3 (three) times daily as needed for spasms. 11/13/14   Ladene Artist, MD  furosemide (LASIX) 40 MG tablet TAKE 1 TABLET (40 MG TOTAL) BY MOUTH DAILY. 02/18/15   Thayer Headings, MD  losartan (COZAAR) 100 MG tablet TAKE 1 TABLET (100 MG TOTAL) BY MOUTH DAILY. 11/12/14    Thayer Headings, MD  omeprazole (PRILOSEC) 40 MG capsule Take 1 capsule (40 mg total) by mouth daily. 11/13/14   Ladene Artist, MD  potassium chloride (K-DUR) 10 MEQ tablet Take 1 tablet (10 mEq total) by mouth 2 (two) times daily. 09/26/14   Thayer Headings, MD  traMADol (ULTRAM) 50 MG tablet Take 1 tablet (50 mg total) by mouth every 6 (six) hours as needed. 03/14/15   Alfonzo Beers, MD   BP 178/90 mmHg  Pulse 76  Temp(Src) 98.4 F (36.9 C) (Oral)  Resp 20  Ht 5\' 6"  (1.676 m)  Wt 268 lb (121.564 kg)  BMI 43.28 kg/m2  SpO2 98%  Vitals reviewed Physical Exam  Physical Examination: General appearance - alert, well appearing, and in no distress Mental status - alert, oriented to person, place, and time Eyes - no conjunctival injection, no scleral icterus Neck - supple, no significant adenopathy Chest - clear to auscultation, no wheezes, rales or rhonchi, symmetric air entry Heart - normal rate, regular rhythm, normal S1, S2, no murmurs, rubs, clicks or gallops Neurological - alert, oriented x 3, sensation intact distally, strength in right upper ext decreased due to pain with motion Musculoskeletal - ttp over anterior right shoulder and proximal humerus, no deformity, FROM of right elbow and wrist, otherwise no joint tenderness, deformity or swelling Extremities - peripheral pulses normal, no pedal edema, no clubbing or cyanosis Skin - normal coloration and turgor, no rashes  ED Course  Procedures (including critical care time) Labs Review Labs Reviewed - No data to display  Imaging Review Dg Shoulder Right  03/14/2015   CLINICAL DATA:  Patient states that yesterday at work she reached for something and immediately started having right shoulder pains, no other complaints  EXAM: RIGHT SHOULDER - 2+ VIEW  COMPARISON:  None.  FINDINGS: No fracture. Glenohumeral and AC joints are normally spaced and aligned. No bone lesion.  Ossification/calcification projects along the bicipital groove  suggesting biceps calcific tendinitis. Soft tissues otherwise unremarkable.  IMPRESSION: 1. No fracture or dislocation. 2. Calcification along the bicipital groove consistent with biceps calcific tendinitis.   Electronically Signed   By: Lajean Manes M.D.   On: 03/14/2015 13:26     EKG Interpretation None      MDM   Final diagnoses:  Biceps tendinitis on right    Pt presenting with pain in right shoulder.  Xray shows biceps calcific tendinitis.  Pt treated with tramadol, placed in sling for comfort.  Given information  for ortho followup.  Discharged with strict return precautions.  Pt agreeable with plan.    Alfonzo Beers, MD 03/14/15 [phone]

## 2015-03-14 NOTE — Discharge Instructions (Signed)
Return to the ED with any concerns including weakness of arm, swelling of arm, chest pain, fever/chills, decreased level of alertness/lethargy, or any other alarming symptoms

## 2015-03-14 NOTE — ED Notes (Signed)
Right arm pain without injury that developed 1 day ago and progressively worsening.  Unrelieved after Tylenol.  Fingers are tingling and states she cannot move right arm due to pain.

## 2015-03-15 ENCOUNTER — Encounter (HOSPITAL_COMMUNITY): Payer: Self-pay

## 2015-03-15 ENCOUNTER — Telehealth: Payer: Self-pay | Admitting: Gastroenterology

## 2015-03-15 ENCOUNTER — Emergency Department (HOSPITAL_COMMUNITY)
Admission: EM | Admit: 2015-03-15 | Discharge: 2015-03-15 | Disposition: A | Discharge status: Home / Self Care | Payer: Managed Care, Other (non HMO) | Attending: Emergency Medicine | Admitting: Emergency Medicine

## 2015-03-15 DIAGNOSIS — Z9889 Other specified postprocedural states: Secondary | ICD-10-CM | POA: Diagnosis not present

## 2015-03-15 DIAGNOSIS — E669 Obesity, unspecified: Secondary | ICD-10-CM | POA: Insufficient documentation

## 2015-03-15 DIAGNOSIS — Z8669 Personal history of other diseases of the nervous system and sense organs: Secondary | ICD-10-CM | POA: Insufficient documentation

## 2015-03-15 DIAGNOSIS — Z86018 Personal history of other benign neoplasm: Secondary | ICD-10-CM | POA: Insufficient documentation

## 2015-03-15 DIAGNOSIS — J45909 Unspecified asthma, uncomplicated: Secondary | ICD-10-CM | POA: Insufficient documentation

## 2015-03-15 DIAGNOSIS — I10 Essential (primary) hypertension: Secondary | ICD-10-CM | POA: Insufficient documentation

## 2015-03-15 DIAGNOSIS — Z720 Tobacco use: Secondary | ICD-10-CM | POA: Insufficient documentation

## 2015-03-15 DIAGNOSIS — Z8742 Personal history of other diseases of the female genital tract: Secondary | ICD-10-CM | POA: Diagnosis not present

## 2015-03-15 DIAGNOSIS — G8929 Other chronic pain: Secondary | ICD-10-CM | POA: Insufficient documentation

## 2015-03-15 DIAGNOSIS — Z79899 Other long term (current) drug therapy: Secondary | ICD-10-CM | POA: Insufficient documentation

## 2015-03-15 DIAGNOSIS — K219 Gastro-esophageal reflux disease without esophagitis: Secondary | ICD-10-CM | POA: Insufficient documentation

## 2015-03-15 DIAGNOSIS — M25511 Pain in right shoulder: Secondary | ICD-10-CM | POA: Diagnosis present

## 2015-03-15 MED ORDER — HYDROCODONE-ACETAMINOPHEN 5-325 MG PO TABS
2.0000 | ORAL_TABLET | Freq: Once | ORAL | Status: AC
  Administered 2015-03-15: 2 via ORAL
  Filled 2015-03-15: qty 2

## 2015-03-15 MED ORDER — HYDROCODONE-ACETAMINOPHEN 5-325 MG PO TABS: 1.0000 | ORAL_TABLET | ORAL | Status: DC | PRN
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
## 2015-03-15 NOTE — ED Notes (Addendum)
Per PTAR, pt complaining of right shoulder pain radiating down the arm with tingling of the fingers. Pt was seen at med center yesterday and diagnosed with tendonitis and given tramadol. Pt states that tramadol is not helping at all. Pt has right arm sling on and is sobbing sitting in the bed. EMS VS: 138/70, 80bpm, 20 RR

## 2015-03-15 NOTE — Discharge Instructions (Signed)

## 2015-03-15 NOTE — ED Provider Notes (Signed)
CSN: [id number]     Arrival date & time 03/15/15  1884 History   First MD Initiated Contact with Patient 03/15/15 0434     Chief Complaint  Patient presents with  . Arm Pain     (Consider location/radiation/quality/duration/timing/severity/associated sxs/prior Treatment) Patient is a 54 y.o. female presenting with arm pain. The history is provided by the patient. No language interpreter was used.  Arm Pain This is a new problem. The current episode started yesterday. Pertinent negatives include no chest pain, chills, fever, neck pain or weakness. Associated symptoms comments: She reports she reached for something at work yesterday and felt a sudden onset of pain in the right shoulder that extends distally to the hand. She has tingling into her 2-5 fingers without weakness. No neck pain. She has not had this pain in the past. No chest pain or shortness of breath. .    Past Medical History  Diagnosis Date  . Hypertension   . Chest pain, non-cardiac   . Obesity   . Cigarette smoker   . IBS (irritable bowel syndrome)   . GERD (gastroesophageal reflux disease)   . Diverticulosis   . Pyloric erosion   . Allergic rhinitis   . Sleep apnea     CPAP setting varies from 4-10  . Chronic headaches   . Breast nodule     right breast, to see dr Helane Rima 06-20-2012 for   . Complication of anesthesia 05-17-2012    trouble breathing after colonscopy, needed nebulizer  . Hepatic hemangioma   . Asthma    Past Surgical History  Procedure Laterality Date  . Cesarean section  2003, 2009    x 2  . Laparoscopy    . Umbilical hernia repair  2004  . US echocardiography  05/08/2008    EF 55-60%  . Cardiovascular stress test  03/25/2007    EF 78%  . Endometrial ablation  09/2010    and D&C  . Cardiac catheterization  12/28/2007    EF 75%. IT REVEALS NORMAL/SUPRANORMAL LEFT VENTRICULAR SYSTOLIC FUNCTION  . Cholecystectomy  1993  . Hernia repair  1660    umbilical  . Ventral hernia repair  06/15/2012    Procedure: LAPAROSCOPIC VENTRAL HERNIA;  Surgeon: Edward Jolly, MD;  Location: [facility];  Service: General;  Laterality: N/A;  LAPAROSCOPIC REPAIR VENTRAL HERNIA  . Colonoscopy  2014  . Tubal ligation     Family History  Problem Relation Age of Onset  . Hypertension Mother   . Diabetes Maternal Grandmother   . Colon cancer Neg Hx   . Lung cancer Mother   . Heart disease Father   . Heart attack Paternal Uncle   . Breast cancer     History  Substance Use Topics  . Smoking status: Current Every Day Smoker -- 0.50 packs/day for 25 years    Types: Cigarettes  . Smokeless tobacco: Never Used     Comment: form given 10/19/14  . Alcohol Use: 0.0 oz/week    0 Standard drinks or equivalent per week     Comment: very rare   OB History    No data available     Review of Systems  Constitutional: Negative for fever and chills.  Respiratory: Negative.  Negative for shortness of breath.   Cardiovascular: Negative.  Negative for chest pain.  Musculoskeletal: Negative for neck pain.       See HPI.  Skin: Negative.  Negative for color change.  Neurological: Negative.  Negative for weakness.  Allergies  Hctz; Lisinopril; Morphine and related; Other; and Percocet  Home Medications   Prior to Admission medications   Medication Sig Start Date End Date Taking? Authorizing Provider  acetaminophen (TYLENOL) 500 MG tablet Take 1,000 mg by mouth every 6 (six) hours as needed. Pain    Historical Provider, MD  amLODipine (NORVASC) 10 MG tablet TAKE 1 TABLET (10 MG TOTAL) BY MOUTH DAILY. 09/11/14   Thayer Headings, MD  carvedilol (COREG) 25 MG tablet Take 1 tablet (25 mg total) by mouth 2 (two) times daily. 03/19/14   Thayer Headings, MD  dicyclomine (BENTYL) 10 MG capsule Take 1 capsule (10 mg total) by mouth 3 (three) times daily as needed for spasms. 11/13/14   Ladene Artist, MD  furosemide (LASIX) 40 MG tablet TAKE 1 TABLET (40 MG TOTAL) BY MOUTH DAILY. 02/18/15   Thayer Headings,  MD  losartan (COZAAR) 100 MG tablet TAKE 1 TABLET (100 MG TOTAL) BY MOUTH DAILY. 11/12/14   Thayer Headings, MD  omeprazole (PRILOSEC) 40 MG capsule Take 1 capsule (40 mg total) by mouth daily. 11/13/14   Ladene Artist, MD  potassium chloride (K-DUR) 10 MEQ tablet Take 1 tablet (10 mEq total) by mouth 2 (two) times daily. 09/26/14   Thayer Headings, MD  traMADol (ULTRAM) 50 MG tablet Take 1 tablet (50 mg total) by mouth every 6 (six) hours as needed. 03/14/15   Alfonzo Beers, MD   BP 180/93 mmHg  Pulse 81  Temp(Src) 98.5 F (36.9 C) (Oral)  Resp 22  Ht 5\' 6"  (1.676 m)  Wt 270 lb (122.471 kg)  BMI 43.60 kg/m2  SpO2 99% Physical Exam  Constitutional: She is oriented to person, place, and time. She appears well-developed and well-nourished.  Uncomfortable appearing.   HENT:  Head: Normocephalic.  Neck: Normal range of motion. Neck supple.  Cardiovascular: Normal rate.   Pulmonary/Chest: Effort normal. She exhibits no tenderness.  Musculoskeletal: Normal range of motion.  No midline or paracervical tenderness. FROM right UE. Tenderness to anterior shoulder without brachial plexus tenderness. No swelling of the shoulder or arm.   Neurological: She is alert and oriented to person, place, and time.  Skin: Skin is warm and dry. No rash noted.  Psychiatric: She has a normal mood and affect.    ED Course  Procedures (including critical care time) Labs Review Labs Reviewed - No data to display  Imaging Review Dg Shoulder Right  03/14/2015   CLINICAL DATA:  Patient states that yesterday at work she reached for something and immediately started having right shoulder pains, no other complaints  EXAM: RIGHT SHOULDER - 2+ VIEW  COMPARISON:  None.  FINDINGS: No fracture. Glenohumeral and AC joints are normally spaced and aligned. No bone lesion.  Ossification/calcification projects along the bicipital groove suggesting biceps calcific tendinitis. Soft tissues otherwise unremarkable.  IMPRESSION:  1. No fracture or dislocation. 2. Calcification along the bicipital groove consistent with biceps calcific tendinitis.   Electronically Signed   By: Lajean Manes M.D.   On: 03/14/2015 13:26     EKG Interpretation None      MDM   Final diagnoses:  None    1. Right shoulder pain  Doubt radiculopathy without neck pain, and specific distribution. Pain is reproducible. Chart reviewed. Shoulder x-ray was negative. Pain is improved with medication. She has ortho follow up in place Monday (03/18/15). Stable for discharge.     Charlann Lange, PA-C 03/15/15 1245  Charlesetta Shanks, MD 03/15/15  0717 

## 2015-03-25 ENCOUNTER — Other Ambulatory Visit: Payer: Self-pay

## 2015-03-25 MED ORDER — AMLODIPINE BESYLATE 10 MG PO TABS: ORAL_TABLET | ORAL | Status: DC

## 2015-04-11 ENCOUNTER — Encounter: Payer: Self-pay | Admitting: Emergency Medicine

## 2015-04-11 ENCOUNTER — Ambulatory Visit (INDEPENDENT_AMBULATORY_CARE_PROVIDER_SITE_OTHER): Payer: Managed Care, Other (non HMO) | Admitting: Emergency Medicine

## 2015-04-11 VITALS — BP 122/70 | HR 73 | Ht 66.0 in | Wt 274.0 lb

## 2015-04-11 DIAGNOSIS — R599 Enlarged lymph nodes, unspecified: Secondary | ICD-10-CM | POA: Diagnosis not present

## 2015-04-11 DIAGNOSIS — G4730 Sleep apnea, unspecified: Secondary | ICD-10-CM

## 2015-04-11 DIAGNOSIS — R0609 Other forms of dyspnea: Secondary | ICD-10-CM | POA: Diagnosis not present

## 2015-04-11 DIAGNOSIS — R590 Localized enlarged lymph nodes: Secondary | ICD-10-CM

## 2015-04-11 DIAGNOSIS — J449 Chronic obstructive pulmonary disease, unspecified: Secondary | ICD-10-CM | POA: Diagnosis not present

## 2015-04-11 DIAGNOSIS — R911 Solitary pulmonary nodule: Secondary | ICD-10-CM

## 2015-04-11 NOTE — Assessment & Plan Note (Signed)
As evidenced by her poorly function testing and her suspected air trapping on CT chest. She continues to smoke and I believe that this is her largest issue. She may benefit at some point from a bronchodilator but for now I would like to concentrate on tobacco cessation. I'll reassess her next visit and consider a bronchodilator at that time

## 2015-04-11 NOTE — Assessment & Plan Note (Signed)
Minor and stable by CT chest

## 2015-04-11 NOTE — Progress Notes (Signed)
Subjective:    Patient ID: Lisa [name], female    DOB: 07/05/1961, 54 y.o.   MRN: [id number]  Shortness of Breath Pertinent negatives include no ear pain, fever, headaches, leg swelling, rash, rhinorrhea, sore throat, vomiting or wheezing.   54 yo smoker (30-40 pk-yrs) with OSA (on CPAP), allergies, HTN, non-cardiac CP, GERD and IBS. Followed and referred by L. Gerhardt with cardiology.  She has been seen for dyspnea. Chemical myocardial perfusion scan 4/7 > small reversible defer distal LAD distribution.  CXR 3/17 normal.  She is referred here to further eval her SOB. She has exertional muscle fatigue. No wheeze. Some cough, bothers her after a URI or with allergies. Also has GERD that isn't reliably treated. She used to be on lisinopril but changed to losartan. She had PFT 3/'15 that show primarily restriction.   ROV 09/27/14 -- former smoker follows up for dyspnea, mixed disease on PFT. She has seen Dr Acie Fredrickson who recommended exercise, wt loss. Did not repeat her L cath at that time. She has been having trouble with leg cramps - increased her potassium. She is noticing some dyspnea with climbing stairs. Doesn't happen every time.  She is still smoking but has tried to cut down.    ROV 01/18/15 -- follow-up visit for tobacco use, mixed obstruction and restriction. She also has a history of obstructive sleep apnea and is in need of a repeat polysomnogram. She has been stable until about 3 weeks ago when she had a URI, still having some cough. She is still smoking, is tearful because she was unable to stop as we had discussed. She ash also had a CT scan abd 01/01/15 that showed some air trapping and mediastinal LAD. She is having some exertional SOB with ambulation.   ROV 04/11/15 -- follow up visit dyspnea and obstructive sleep apnea on CPAP. She has mixed obstruction and restriction on spirometry. She underwent a CT scan of the chest on 2/25 to evaluate possible mediastinal lymphadenopathy. I have  reviewed the scan myself shows some mild scattered mediastinal and hilar nodes as well as some evidence of mild groundglass versus air trapping.  Also noted was a 5.5 mm right middle lobe pulmonary nodule that will need to be followed. She is tolerating her CPAP. Her breathing has overall been stable, but in the last few days she has noticed some instances when she had to stop. Her GERD has been active. Sometimes associated with some chest tightness.    Review of Systems  Constitutional: Negative for fever and unexpected weight change.  HENT: Negative for congestion, dental problem, ear pain, nosebleeds, postnasal drip, rhinorrhea, sinus pressure, sneezing, sore throat and trouble swallowing.   Eyes: Negative for redness and itching.  Respiratory: Positive for shortness of breath. Negative for cough, chest tightness and wheezing.   Cardiovascular: Negative for palpitations and leg swelling.  Gastrointestinal: Negative for nausea and vomiting.  Genitourinary: Negative for dysuria.  Musculoskeletal: Negative for joint swelling.  Skin: Negative for rash.  Neurological: Negative for headaches.  Hematological: Does not bruise/bleed easily.  Psychiatric/Behavioral: Negative for dysphoric mood. The patient is not nervous/anxious.         Objective:   Physical Exam Filed Vitals:   04/11/15 1143  BP: 122/70  Pulse: 73  Height: 5\' 6"  (1.676 m)  Weight: 274 lb (124.286 kg)  SpO2: 96%   Gen: Pleasant, obese, in no distress,  normal affect  ENT: No lesions,  mouth clear, mild L ptosis  Neck: No  JVD, no TMG, no carotid bruits  Lungs: No use of accessory muscles, clear without rales or rhonchi  Cardiovascular: RRR, heart sounds normal, no murmur or gallops, trace peripheral edema  Musculoskeletal: No deformities, no cyanosis or clubbing  Neuro: alert, non focal  Skin: Warm, no lesions or rashes     Assessment & Plan:  DOE (dyspnea on exertion) Good discussion today about the  different influences on her dyspnea. We discussed her restrictive lung disease due to obesity, deconditioning, obstructive lung disease due to her tobacco use. She does have some evidence of air trapping versus mild ground glass on her CT scan consistent with her tobacco use. She also likely has some degree of secondary pulmonary hypertension. I have recommended that she undertake an exercise routine and advance slowly. We discussed possible bronchodilators as above but I believe that the most important thing for her to do is to stop smoking.    COPD (chronic obstructive pulmonary disease) As evidenced by her poorly function testing and her suspected air trapping on CT chest. She continues to smoke and I believe that this is her largest issue. She may benefit at some point from a bronchodilator but for now I would like to concentrate on tobacco cessation. I'll reassess her next visit and consider a bronchodilator at that time   Mediastinal lymphadenopathy Minor and stable by CT chest   Sleep apnea I have asked her to speak with Dr. Brett Fairy about a possible pressure re-titration study   Pulmonary nodule, right 5.5 mm right middle lobe nodule in a high-risk patient with a positive family history and continued tobacco abuse. She will need a repeat CT chest in 6 months. Counseled her extensively regarding pulmonary nodules, potential false positives and the reasoning behind repeat CT scan   over 80% of this 30 minute visit was spent in counseling regarding the several issues above

## 2015-04-11 NOTE — Assessment & Plan Note (Signed)
5.5 mm right middle lobe nodule in a high-risk patient with a positive family history and continued tobacco abuse. She will need a repeat CT chest in 6 months. Counseled her extensively regarding pulmonary nodules, potential false positives and the reasoning behind repeat CT scan

## 2015-04-11 NOTE — Patient Instructions (Signed)
Please continue to work hard on decreasing her cigarettes. Set a goal of 15 cigarettes a day. Try to reach this goal in the next month and continue only 15 cigarettes a day until our next visit We will not start any inhaled medications at this time although you may need to do so in the future You have a small pulmonary nodule on your CT scan of the chest. He will need a repeat CT scan of your chest in August 2016 Speak with Dr. Brett Fairy about repeating your CPAP titration Follow with Dr Lamonte Sakai in 3 months or sooner if you have any problems.

## 2015-04-11 NOTE — Assessment & Plan Note (Signed)
I have asked her to speak with Dr. Brett Fairy about a possible pressure re-titration study

## 2015-04-11 NOTE — Assessment & Plan Note (Signed)
Good discussion today about the different influences on her dyspnea. We discussed her restrictive lung disease due to obesity, deconditioning, obstructive lung disease due to her tobacco use. She does have some evidence of air trapping versus mild ground glass on her CT scan consistent with her tobacco use. She also likely has some degree of secondary pulmonary hypertension. I have recommended that she undertake an exercise routine and advance slowly. We discussed possible bronchodilators as above but I believe that the most important thing for her to do is to stop smoking.

## 2015-04-23 ENCOUNTER — Other Ambulatory Visit: Payer: Self-pay | Admitting: Cardiovascular Disease

## 2015-06-12 ENCOUNTER — Other Ambulatory Visit: Payer: Self-pay | Admitting: Cardiovascular Disease

## 2015-06-17 ENCOUNTER — Telehealth: Payer: Self-pay | Admitting: Emergency Medicine

## 2015-06-17 NOTE — Telephone Encounter (Signed)
Called & spoke with pt & explained that we will check with Dr. Lamonte Sakai to see how he wishes to proceed and we will let her know.  Pt agreeable with this, nothing further needed at this time.

## 2015-06-17 NOTE — Telephone Encounter (Signed)
Spoke with the pt. States that she received a letter from her insurance company denying her CT on 07/26/2015. The letter stated that it was to soon from her last CT.  [facility] - can you please help the pt with this matter?

## 2015-06-29 ENCOUNTER — Other Ambulatory Visit: Payer: Self-pay | Admitting: Cardiovascular Disease

## 2015-07-07 ENCOUNTER — Other Ambulatory Visit: Payer: Self-pay | Admitting: Cardiovascular Disease

## 2015-07-10 ENCOUNTER — Other Ambulatory Visit: Payer: Self-pay | Admitting: Cardiovascular Disease

## 2015-07-15 ENCOUNTER — Telehealth: Payer: Self-pay | Admitting: Cardiovascular Disease

## 2015-07-15 NOTE — Telephone Encounter (Signed)
Agree with note from Christen Bame, RN. Will see her on Friday

## 2015-07-15 NOTE — Telephone Encounter (Signed)
Pt c/o swelling: STAT is pt has developed SOB within 24 hours  1. How long have you been experiencing swelling? 1week  2. Where is the swelling located? Lower rt leg, ankles and feet and numbness in outer rt thigh  3.  Are you currently taking a "fluid pill"?Yes  4.  Are you currently SOB? No  5.  Have you traveled recently?No

## 2015-07-15 NOTE — Telephone Encounter (Signed)
Left message for patient to call me back. 

## 2015-07-15 NOTE — Telephone Encounter (Signed)
Spoke with patient who states she has severe swelling in right lower extremity and numbness in outer right thigh.  Complains of some swelling in left lower extremity, worse in RLE.  She c/o mild SOB but states she has early stages of COPD per pulmonologist.  She speaks in clear complete sentences without breathlessness.  Reports BP has been 193'X systolic over 90'W diastolic.  Per echo from 5/15, she has a grade 2 diastolic dysfunction.  Denies high sodium diet and reports she elevates her legs as often as possible.  Reports compliance with medications, including Amlodipine.  She is concerned about CHF.  I advised her to hold her Amlodipine and scheduled her to see Dr. Acie Fredrickson this Friday 8/19.  I advised that he will evaluate her for symptoms of CHF and that we can also assess if the swelling improves from holding Amlodipine.  I advised her to continue leg elevation and low sodium diet and to call back if SOB worsens or if she has other questions or concerns.  I answered all of her questions to her satisfaction and she verbalized understanding and agreement with plan of care.  Routing to Dr. Acie Fredrickson for additional advice and his awareness.

## 2015-07-19 ENCOUNTER — Encounter: Payer: Self-pay | Admitting: Cardiovascular Disease

## 2015-07-19 ENCOUNTER — Other Ambulatory Visit: Payer: Self-pay | Admitting: Cardiovascular Disease

## 2015-07-19 ENCOUNTER — Ambulatory Visit (INDEPENDENT_AMBULATORY_CARE_PROVIDER_SITE_OTHER): Payer: Managed Care, Other (non HMO) | Admitting: Cardiovascular Disease

## 2015-07-19 VITALS — BP 130/80 | HR 82 | Ht 66.0 in | Wt 290.0 lb

## 2015-07-19 DIAGNOSIS — I10 Essential (primary) hypertension: Secondary | ICD-10-CM | POA: Diagnosis not present

## 2015-07-19 DIAGNOSIS — R600 Localized edema: Secondary | ICD-10-CM | POA: Diagnosis not present

## 2015-07-19 DIAGNOSIS — J449 Chronic obstructive pulmonary disease, unspecified: Secondary | ICD-10-CM | POA: Diagnosis not present

## 2015-07-19 DIAGNOSIS — R0602 Shortness of breath: Secondary | ICD-10-CM

## 2015-07-19 NOTE — Progress Notes (Signed)
Lisa [name] Date of Birth  03/28/1961 [facility] [address]    [place], [place]  [address] [phone]  Fax  [phone]   Problem List:  1. Chest pain - normal cath in the past 2. Cigarette smoking. 3. COPD 4. Leg edema   History of Present Illness:  Lisa [name] a 54 year old female with a history of hypertension. He's had some occasional episodes of chest discomfort.  These episodes of chest pain occur at times and are not necessarily associated with eating, drinking, change of position, or exercise.  She [name] walking on the treadmill 3 times a week.  She does not have chest pain while she [name] walking.  Feb. 26, 2014:  Lisa [name] [name] doing well.  She has gained weight since I last saw her.  She has not been walking as much.  March 19, 2014:   She was seen by [name] last month for some chest pain. She had a Myoview study which revealed an intermediate risk. She continues to smoke. She [name] very upset and emotional at her last office visit.  She was started on Imdur but was  having lots of side effects.   She [name] still trying to stop smoking.   She has attended the smoking cessation classes at [facility].    She has started walking with her son several days ago.  The exercise [name] going OK.    She does have some occasional click chest pains. These only last for several seconds.  She has had progressive bilateral edema    Oct. 28, 2015:  Lisa [name] feels ok.  No further episodes of CP Having numbness in her right thigh.   I suggested that this was a back or neuro issue. She has intermittant leg edema.  August 19th, 2016  Lisa [name] called today complaining of some leg edema. She's had  leg edema on an intermittent basis. BP has been well controlled. Has tried to exercise but has lots of back pain and leg pain and leg weakness.  The leg swelling has worsened over the past several weeks.  On lasix   Weight has increased significantly over the past year    Current  Outpatient Prescriptions on File Prior to Visit  Medication Sig Dispense Refill  . carvedilol (COREG) 25 MG tablet TAKE 1 TABLET (25 MG TOTAL) BY MOUTH 2 (TWO) TIMES DAILY. 60 tablet 1  . dicyclomine (BENTYL) 10 MG capsule Take 1 capsule (10 mg total) by mouth 3 (three) times daily as needed for spasms. 90 capsule 12  . furosemide (LASIX) 40 MG tablet TAKE 1 TABLET (40 MG TOTAL) BY MOUTH DAILY. 90 tablet 0  . HYDROcodone-acetaminophen (NORCO/VICODIN) 5-325 MG per tablet Take 1-2 tablets by mouth every 4 (four) hours as needed. 20 tablet 0  . losartan (COZAAR) 100 MG tablet TAKE 1 TABLET (100 MG TOTAL) BY MOUTH DAILY. 90 tablet 1  . omeprazole (PRILOSEC) 40 MG capsule Take 1 capsule (40 mg total) by mouth daily. 90 capsule 3  . potassium chloride (K-DUR) 10 MEQ tablet Take 1 tablet (10 mEq total) by mouth 2 (two) times daily. 180 tablet 3  . amLODipine (NORVASC) 10 MG tablet TAKE 1 TABLET (10 MG TOTAL) BY MOUTH DAILY. (Patient not taking: Reported on 07/19/2015) 30 tablet 0   No current facility-administered medications on file prior to visit.    Allergies  Allergen Reactions  . Hctz [Hydrochlorothiazide]     Causes a headache  . Lisinopril Cough  Cough   . Morphine And Related     Moodiness.  Causes anger.  . Other     guaifensen             Restlessness, jittery  . Percocet [Oxycodone-Acetaminophen] Other (See Comments)    Past Medical History  Diagnosis Date  . Hypertension   . Chest pain, non-cardiac   . Obesity   . Cigarette smoker   . IBS (irritable bowel syndrome)   . GERD (gastroesophageal reflux disease)   . Diverticulosis   . Pyloric erosion   . Allergic rhinitis   . Sleep apnea     CPAP setting varies from 4-10  . Chronic headaches   . Breast nodule     right breast, to see dr Helane Rima 06-20-2012 for   . Complication of anesthesia 05-17-2012    trouble breathing after colonscopy, needed nebulizer  . Hepatic hemangioma   . Asthma     Past Surgical History   Procedure Laterality Date  . Cesarean section  2003, 2009    x 2  . Laparoscopy    . Umbilical hernia repair  2004  . US echocardiography  05/08/2008    EF 55-60%  . Cardiovascular stress test  03/25/2007    EF 78%  . Endometrial ablation  09/2010    and D&C  . Cardiac catheterization  12/28/2007    EF 75%. IT REVEALS NORMAL/SUPRANORMAL LEFT VENTRICULAR SYSTOLIC FUNCTION  . Cholecystectomy  1993  . Hernia repair  4174    umbilical  . Ventral hernia repair  06/15/2012    Procedure: LAPAROSCOPIC VENTRAL HERNIA;  Surgeon: Edward Jolly, MD;  Location: [facility];  Service: General;  Laterality: N/A;  LAPAROSCOPIC REPAIR VENTRAL HERNIA  . Colonoscopy  2014  . Tubal ligation      History  Smoking status  . Current Every Day Smoker -- 0.50 packs/day for 25 years  . Types: Cigarettes  Smokeless tobacco  . Never Used    Comment: form given 10/19/14    History  Alcohol Use  . 0.0 oz/week  . 0 Standard drinks or equivalent per week    Comment: very rare    Family History  Problem Relation Age of Onset  . Hypertension Mother   . Diabetes Maternal Grandmother   . Colon cancer Neg Hx   . Lung cancer Mother   . Heart disease Father   . Heart attack Paternal Uncle   . Breast cancer      Reviw of Systems:  Reviewed in the HPI.  All other systems are negative.  Physical Exam: BP 130/80 mmHg  Pulse 82  Ht 5\' 6"  (1.676 m)  Wt 131.543 kg (290 lb)  BMI 46.83 kg/m2 The patient [name] alert and oriented x 3.  The mood and affect are normal.   Skin: warm and dry.  Color [name] normal.   HEENT:   Normal carotids, no JVD, neck [name] supple Lungs: scattered wheezes.  Heart: RR, normal    Abdomen: + BS, no HSM Extremities:  Bilateral leg edema 1+ - 2+ bilateral   with chroinic stasis changes Neuro:  Non focal     ECG: Aug. 19, 2016:  NSR at 82.  Normal   Assessment / Plan:   1. Chest pain - normal cath in the past 2. Cigarette smoking. - I've encouraged her to stop smoking. 3. COPD  -  4. Leg edema - [name] suspect this [name] mostly due to her obesity, COPD. We have held the  amlodipine. Her left ventricular function has been normal in the past and we'll recheck an echocardiogram.  I've given her instructions about leg elevation and salt restriction. I have also encouraged her to lose weight.    Nahser, Wonda Cheng, MD  07/19/2015 4:33 PM    [place],  [place], [place]  [address] Pager [phone] Phone: [phone]; Fax: [phone]   [facility]  [address] [place], [place]  [address] [phone]   Fax [phone]

## 2015-07-19 NOTE — Patient Instructions (Addendum)
Medication Instructions:  DOUBLE Lasix and potassium (Kdur) for 3 days (Saturday, Sunday, Monday)  Labwork: None Ordered   Testing/Procedures: Your physician has requested that you have an echocardiogram. Echocardiography is a painless test that uses sound waves to create images of your heart. It provides your doctor with information about the size and shape of your heart and how well your heart's chambers and valves are working. This procedure takes approximately one hour. There are no restrictions for this procedure.   Follow-Up: Your physician recommends that you schedule a follow-up appointment in: 3 months with Dr. Acie Fredrickson   For your  leg edema you  should do  the following 1. Leg elevation - I recommend the Lounge Dr. Leg rest.  See below for details  2. Salt restriction  -  Use potassium chloride instead of regular salt as a salt substitute. 3. Walk regularly 4. Compression hose - [facility] 5. Weight loss  6. Stop smoking    Go to [email or web address]

## 2015-07-22 ENCOUNTER — Ambulatory Visit: Payer: Managed Care, Other (non HMO) | Admitting: Emergency Medicine

## 2015-07-24 ENCOUNTER — Other Ambulatory Visit: Payer: Self-pay | Admitting: Cardiovascular Disease

## 2015-07-25 ENCOUNTER — Other Ambulatory Visit: Payer: Self-pay

## 2015-07-25 ENCOUNTER — Ambulatory Visit (HOSPITAL_COMMUNITY): Payer: Managed Care, Other (non HMO) | Attending: Cardiology

## 2015-07-25 DIAGNOSIS — Z6841 Body Mass Index (BMI) 40.0 and over, adult: Secondary | ICD-10-CM | POA: Insufficient documentation

## 2015-07-25 DIAGNOSIS — E669 Obesity, unspecified: Secondary | ICD-10-CM | POA: Diagnosis not present

## 2015-07-25 DIAGNOSIS — I517 Cardiomegaly: Secondary | ICD-10-CM | POA: Insufficient documentation

## 2015-07-25 DIAGNOSIS — I10 Essential (primary) hypertension: Secondary | ICD-10-CM | POA: Diagnosis not present

## 2015-07-25 DIAGNOSIS — F17200 Nicotine dependence, unspecified, uncomplicated: Secondary | ICD-10-CM | POA: Diagnosis not present

## 2015-07-26 ENCOUNTER — Ambulatory Visit (INDEPENDENT_AMBULATORY_CARE_PROVIDER_SITE_OTHER)
Admission: RE | Admit: 2015-07-26 | Discharge: 2015-07-26 | Disposition: A | Discharge status: Home / Self Care | Payer: Managed Care, Other (non HMO) | Source: Ambulatory Visit | Attending: Emergency Medicine | Admitting: Emergency Medicine

## 2015-07-26 DIAGNOSIS — R911 Solitary pulmonary nodule: Secondary | ICD-10-CM

## 2015-07-26 NOTE — Telephone Encounter (Signed)
This pt is high risk, has pulmonary nodule in a former smoker.  Pt needs 6 month repeat scan if insurance requires a prior auth or peer to peer auth please arrange for this.  Thanks

## 2015-07-28 ENCOUNTER — Other Ambulatory Visit: Payer: Self-pay | Admitting: Cardiovascular Disease

## 2015-07-29 ENCOUNTER — Other Ambulatory Visit: Payer: Self-pay | Admitting: Cardiovascular Disease

## 2015-07-29 NOTE — Telephone Encounter (Signed)
Pt had her CT Scan on 07/26/15, authorization received from her ins.

## 2015-08-12 ENCOUNTER — Encounter: Payer: Self-pay | Admitting: Emergency Medicine

## 2015-08-12 ENCOUNTER — Ambulatory Visit (INDEPENDENT_AMBULATORY_CARE_PROVIDER_SITE_OTHER): Payer: Managed Care, Other (non HMO) | Admitting: Emergency Medicine

## 2015-08-12 VITALS — BP 122/72 | HR 81 | Ht 66.0 in | Wt 277.0 lb

## 2015-08-12 DIAGNOSIS — G4730 Sleep apnea, unspecified: Secondary | ICD-10-CM

## 2015-08-12 DIAGNOSIS — R911 Solitary pulmonary nodule: Secondary | ICD-10-CM

## 2015-08-12 DIAGNOSIS — J449 Chronic obstructive pulmonary disease, unspecified: Secondary | ICD-10-CM | POA: Diagnosis not present

## 2015-08-12 DIAGNOSIS — Z720 Tobacco use: Secondary | ICD-10-CM | POA: Diagnosis not present

## 2015-08-12 DIAGNOSIS — F17200 Nicotine dependence, unspecified, uncomplicated: Secondary | ICD-10-CM

## 2015-08-12 DIAGNOSIS — Z23 Encounter for immunization: Secondary | ICD-10-CM

## 2015-08-12 NOTE — Assessment & Plan Note (Signed)
With evidence of possible lung air trapping on CT scan versus bronchiolitis. Not currently on bronchodilators. The most important intervention first make his smoking cessation. We discussed this in detail today.

## 2015-08-12 NOTE — Progress Notes (Signed)
Subjective:    Patient ID: Lisa [name], female    DOB: 11/02/1961, 54 y.o.   MRN: [id number]  Shortness of Breath Pertinent negatives include no ear pain, fever, headaches, leg swelling, rash, rhinorrhea, sore throat, vomiting or wheezing.   54 yo smoker (30-40 pk-yrs) with OSA (on CPAP), allergies, HTN, non-cardiac CP, GERD and IBS. Followed and referred by L. Gerhardt with cardiology.  She has been seen for dyspnea. Chemical myocardial perfusion scan 4/7 > small reversible defer distal LAD distribution.  CXR 3/17 normal.  She is referred here to further eval her SOB. She has exertional muscle fatigue. No wheeze. Some cough, bothers her after a URI or with allergies. Also has GERD that isn't reliably treated. She used to be on lisinopril but changed to losartan. She had PFT 3/'15 that show primarily restriction.   ROV 09/27/14 -- former smoker follows up for dyspnea, mixed disease on PFT. She has seen Dr Acie Fredrickson who recommended exercise, wt loss. Did not repeat her L cath at that time. She has been having trouble with leg cramps - increased her potassium. She is noticing some dyspnea with climbing stairs. Doesn't happen every time.  She is still smoking but has tried to cut down.    ROV 01/18/15 -- follow-up visit for tobacco use, mixed obstruction and restriction. She also has a history of obstructive sleep apnea and is in need of a repeat polysomnogram. She has been stable until about 3 weeks ago when she had a URI, still having some cough. She is still smoking, is tearful because she was unable to stop as we had discussed. She ash also had a CT scan abd 01/01/15 that showed some air trapping and mediastinal LAD. She is having some exertional SOB with ambulation.   ROV 04/11/15 -- follow up visit dyspnea and obstructive sleep apnea on CPAP. She has mixed obstruction and restriction on spirometry. She underwent a CT scan of the chest on 2/25 to evaluate possible mediastinal lymphadenopathy. I have  reviewed the scan myself shows some mild scattered mediastinal and hilar nodes as well as some evidence of mild groundglass versus air trapping.  Also noted was a 5.5 mm right middle lobe pulmonary nodule that will need to be followed. She is tolerating her CPAP. Her breathing has overall been stable, but in the last few days she has noticed some instances when she had to stop. Her GERD has been active. Sometimes associated with some chest tightness.   ROV 08/12/15 -- follow-up visit for multifactorial dyspnea, tobacco abuse, obstructive sleep apnea on CPAP. She also has a 5.5 mm right middle lobe nodule that we have followed on CT scan of the chest. Her most recent CT scan was performed on 07/26/2015. I have personally reviewed the scan shows that her right middle lobe nodule stable compared with February. She also still has stable mosaic changes consistent with either a bronchiolitis or air trapping.  She is planning to set a cigarettes quit date in mid-October. Not on BD's, her breathing is unchanged, is able to walk. She had her CPAP titrated, but needs to get her device repaired. Good compliance but she doesn't feel that she can go without it long enough for them to repair her device.    Review of Systems  Constitutional: Negative for fever and unexpected weight change.  HENT: Negative for congestion, dental problem, ear pain, nosebleeds, postnasal drip, rhinorrhea, sinus pressure, sneezing, sore throat and trouble swallowing.   Eyes: Negative for redness and  itching.  Respiratory: Positive for cough. Negative for chest tightness, shortness of breath and wheezing.   Cardiovascular: Negative for palpitations and leg swelling.  Gastrointestinal: Negative for nausea and vomiting.  Genitourinary: Negative for dysuria.  Musculoskeletal: Negative for joint swelling.  Skin: Negative for rash.  Neurological: Negative for headaches.  Hematological: Does not bruise/bleed easily.  Psychiatric/Behavioral:  Negative for dysphoric mood. The patient is not nervous/anxious.         Objective:   Physical Exam Filed Vitals:   08/12/15 1546 08/12/15 1547  BP:  122/72  Pulse:  81  Height: 5\' 6"  (1.676 m)   Weight: 277 lb (125.646 kg)   SpO2:  95%   Gen: Pleasant, obese, in no distress,  normal affect  ENT: No lesions,  mouth clear, mild L ptosis  Neck: No JVD, no TMG, no carotid bruits  Lungs: No use of accessory muscles, clear without rales or rhonchi  Cardiovascular: RRR, heart sounds normal, no murmur or gallops, trace peripheral edema  Musculoskeletal: No deformities, no cyanosis or clubbing  Neuro: alert, non focal  Skin: Warm, no lesions or rashes     Assessment & Plan:  No problem-specific assessment & plan notes found for this encounter. over 80% of this 30 minute visit was spent in counseling regarding the several issues above

## 2015-08-12 NOTE — Assessment & Plan Note (Signed)
Stable by CT scan 07/26/15. I will plan to repeat in one year

## 2015-08-12 NOTE — Assessment & Plan Note (Signed)
Currently on CPAP. She needs to have her pressure adjusted but has been unable to accomplish this because some of the knobs on her machine are not functional. [facility] has been unable to fix this without taking the device, and she is unable to sleep without the device. They wanted to charge her for a loaner. I will try to get the adjustment made for her without interrupting her CPAP use every night

## 2015-08-12 NOTE — Patient Instructions (Addendum)
We will ask [employer] to work on repairing your CPAP so that your pressures can be adjusted Congratulations on your efforts to stop smoking. Work hard to decrease her cigarettes to 10 or less by October 1.  On October 1 we will start Wellbutrin, with a plan to stop smoking altogether on October 15.  Call us so we can offer support as you work to stop.  Follow with Dr Lamonte Sakai in 3 months or sooner if you have any problems. We will repeat your CT scan of the chest in 1 year.

## 2015-08-12 NOTE — Assessment & Plan Note (Signed)
We made a plan for her to cut her cigarettes down to 10 daily October first. Then on October 1 she will start Wellbutrin. Her quit date will be October 15

## 2015-10-13 ENCOUNTER — Other Ambulatory Visit: Payer: Self-pay | Admitting: Cardiovascular Disease

## 2015-10-14 ENCOUNTER — Other Ambulatory Visit: Payer: Self-pay | Admitting: *Deleted

## 2015-10-14 MED ORDER — CARVEDILOL 25 MG PO TABS: 25.0000 mg | ORAL_TABLET | Freq: Two times a day (BID) | ORAL | Status: DC

## 2015-10-21 ENCOUNTER — Ambulatory Visit: Payer: Managed Care, Other (non HMO) | Admitting: Cardiovascular Disease

## 2015-10-28 ENCOUNTER — Ambulatory Visit (INDEPENDENT_AMBULATORY_CARE_PROVIDER_SITE_OTHER): Payer: Managed Care, Other (non HMO) | Admitting: Cardiovascular Disease

## 2015-10-28 ENCOUNTER — Encounter: Payer: Self-pay | Admitting: Cardiovascular Disease

## 2015-10-28 VITALS — BP 122/76 | HR 82 | Ht 66.0 in | Wt 276.1 lb

## 2015-10-28 DIAGNOSIS — I10 Essential (primary) hypertension: Secondary | ICD-10-CM

## 2015-10-28 DIAGNOSIS — Z13220 Encounter for screening for lipoid disorders: Secondary | ICD-10-CM | POA: Diagnosis not present

## 2015-10-28 DIAGNOSIS — R600 Localized edema: Secondary | ICD-10-CM

## 2015-10-28 DIAGNOSIS — R0789 Other chest pain: Secondary | ICD-10-CM

## 2015-10-28 DIAGNOSIS — J449 Chronic obstructive pulmonary disease, unspecified: Secondary | ICD-10-CM

## 2015-10-28 LAB — LIPID PANEL
Cholesterol: 172 mg/dL (ref 125–200)
HDL: 24 mg/dL — AB (ref >=46)
LDL Cholesterol: 124 mg/dL (ref <130)
Total CHOL/HDL Ratio: 7.2 Ratio — ABNORMAL HIGH (ref <=5.0)
Triglycerides: 122 mg/dL (ref <150)
VLDL: 24 mg/dL (ref <30)

## 2015-10-28 LAB — COMPREHENSIVE METABOLIC PANEL
ALK PHOS: 72 U/L (ref 33–130)
ALT: 21 U/L (ref 6–29)
AST: 16 U/L (ref 10–35)
Albumin: 3.8 g/dL (ref 3.6–5.1)
BILIRUBIN TOTAL: 0.5 mg/dL (ref 0.2–1.2)
BUN: 19 mg/dL (ref 7–25)
CALCIUM: 9.7 mg/dL (ref 8.6–10.4)
CO2: 25 mmol/L (ref 20–31)
CREATININE: 0.92 mg/dL (ref 0.50–1.05)
Chloride: 106 mmol/L (ref 98–110)
GLUCOSE: 103 mg/dL — AB (ref 65–99)
Potassium: 3.8 mmol/L (ref 3.5–5.3)
SODIUM: 139 mmol/L (ref 135–146)
Total Protein: 7.4 g/dL (ref 6.1–8.1)

## 2015-10-28 MED ORDER — CARVEDILOL 25 MG PO TABS: 25.0000 mg | ORAL_TABLET | Freq: Two times a day (BID) | ORAL | Status: DC

## 2015-10-28 MED ORDER — POTASSIUM CHLORIDE ER 10 MEQ PO TBCR: 10.0000 meq | EXTENDED_RELEASE_TABLET | Freq: Two times a day (BID) | ORAL | Status: DC

## 2015-10-28 MED ORDER — FUROSEMIDE 40 MG PO TABS: 40.0000 mg | ORAL_TABLET | Freq: Every day | ORAL | Status: DC

## 2015-10-28 MED ORDER — LOSARTAN POTASSIUM 100 MG PO TABS: 100.0000 mg | ORAL_TABLET | Freq: Every day | ORAL | Status: DC

## 2015-10-28 NOTE — Patient Instructions (Signed)
Medication Instructions:  Your physician recommends that you continue on your current medications as directed. Please refer to the Current Medication list given to you today.   Labwork: TODAY - cholesterol, liver, basic metabolic panel   Testing/Procedures: None Ordered   Follow-Up: Your physician wants you to follow-up in: 6 months with Dr. Acie Fredrickson.  You will receive a reminder letter in the mail two months in advance. If you don't receive a letter, please call our office to schedule the follow-up appointment.  Your physician wants you to get a Primary Care Doctor.  Some options are listed below: [facility] - multiple locations [facility] - multiple locations [facility] Dr. Rachell Cipro  If you need a refill on your cardiac medications before your next appointment, please call your pharmacy.   Thank you for choosing [facility]! Christen Bame, RN [phone]

## 2015-10-28 NOTE — Progress Notes (Signed)
Lisa [name] Date of Birth  12-13-1960 Lisa [name] [address]    [place], [place]  [address] [phone]  Fax  [phone]   Problem List:  1. Chest pain - normal cath in the past 2. Cigarette smoking. 3. COPD 4. Leg edema   History of Present Illness:  Lisa [name] Is a 54 year old female with a history of hypertension. He's had some occasional episodes of chest discomfort.  These episodes of chest pain occur at times and are not necessarily associated with eating, drinking, change of position, or exercise.  She is walking on the treadmill 3 times a week.  She does not have chest pain while she is walking.  Feb. 26, 2014:  Lisa [name] is doing well.  She has gained weight since I last saw her.  She has not been walking as much.  March 19, 2014:   She was seen by Lisa [name] last month for some chest pain. She had a Myoview study which revealed an intermediate risk. She continues to smoke. She is very upset and emotional at her last office visit.  She was started on Imdur but was  having lots of side effects.   She is still trying to stop smoking.   She has attended the smoking cessation classes at [facility].    She has started walking with her son several days ago.  The exercise is going OK.    She does have some occasional click chest pains. These only last for several seconds.  She has had progressive bilateral edema    Oct. 28, 2015:  Lisa [name] feels ok.  No further episodes of CP Having numbness in her right thigh.   I suggested that this was a back or neuro issue. She has intermittant leg edema.  August 19th, 2016  Lisa [name] called today complaining of some leg edema. She's had  leg edema on an intermittent basis. BP has been well controlled. Has tried to exercise but has lots of back pain and leg pain and leg weakness.  The leg swelling has worsened over the past several weeks.  On lasix   Weight has increased significantly over the past year    Nov. 28,  2016:  Lisa [name] is doing ok Watching what she eats  Has lost 14 lbs over 3 months    Wt Readings from Last 3 Encounters:  10/28/15 276 lb 1.9 oz (125.247 kg)  08/12/15 277 lb (125.646 kg)  07/19/15 290 lb (131.543 kg)   BP is well controlled.     Current Outpatient Prescriptions on File Prior to Visit  Medication Sig Dispense Refill  . amLODipine (NORVASC) 10 MG tablet TAKE 1 TABLET (10 MG TOTAL) BY MOUTH DAILY. 30 tablet 0  . carvedilol (COREG) 25 MG tablet Take 1 tablet (25 mg total) by mouth 2 (two) times daily. 180 tablet 1  . dicyclomine (BENTYL) 10 MG capsule Take 1 capsule (10 mg total) by mouth 3 (three) times daily as needed for spasms. 90 capsule 12  . furosemide (LASIX) 40 MG tablet TAKE 1 TABLET (40 MG TOTAL) BY MOUTH DAILY. 90 tablet 0  . furosemide (LASIX) 40 MG tablet TAKE 1 TABLET (40 MG TOTAL) BY MOUTH DAILY. 90 tablet 0  . HYDROcodone-acetaminophen (NORCO/VICODIN) 5-325 MG per tablet Take 1-2 tablets by mouth every 4 (four) hours as needed. (Patient taking differently: Take 1-2 tablets by mouth every 4 (four) hours as needed for moderate pain or severe pain. ) 20 tablet 0  . losartan (COZAAR)  100 MG tablet TAKE 1 TABLET (100 MG TOTAL) BY MOUTH DAILY. 90 tablet 0  . omeprazole (PRILOSEC) 40 MG capsule Take 1 capsule (40 mg total) by mouth daily. 90 capsule 3  . potassium chloride (K-DUR) 10 MEQ tablet Take 1 tablet (10 mEq total) by mouth 2 (two) times daily. 180 tablet 3   No current facility-administered medications on file prior to visit.    Allergies  Allergen Reactions  . Hctz [Hydrochlorothiazide]     Causes a headache  . Lisinopril Cough    Cough   . Morphine And Related     Moodiness.  Causes anger.  . Other     guaifensen             Restlessness, jittery  . Percocet [Oxycodone-Acetaminophen] Other (See Comments)    Past Medical History  Diagnosis Date  . Hypertension   . Chest pain, non-cardiac   . Obesity   . Cigarette smoker   . IBS  (irritable bowel syndrome)   . GERD (gastroesophageal reflux disease)   . Diverticulosis   . Pyloric erosion   . Allergic rhinitis   . Sleep apnea     CPAP setting varies from 4-10  . Chronic headaches   . Breast nodule     right breast, to see dr Helane Rima 06-20-2012 for   . Complication of anesthesia 05-17-2012    trouble breathing after colonscopy, needed nebulizer  . Hepatic hemangioma   . Asthma     Past Surgical History  Procedure Laterality Date  . Cesarean section  2003, 2009    x 2  . Laparoscopy    . Umbilical hernia repair  2004  . US echocardiography  05/08/2008    EF 55-60%  . Cardiovascular stress test  03/25/2007    EF 78%  . Endometrial ablation  09/2010    and D&C  . Cardiac catheterization  12/28/2007    EF 75%. IT REVEALS NORMAL/SUPRANORMAL LEFT VENTRICULAR SYSTOLIC FUNCTION  . Cholecystectomy  1993  . Hernia repair  [id number]    umbilical  . Ventral hernia repair  06/15/2012    Procedure: LAPAROSCOPIC VENTRAL HERNIA;  Surgeon: Edward Jolly, MD;  Location: [facility];  Service: General;  Laterality: N/A;  LAPAROSCOPIC REPAIR VENTRAL HERNIA  . Colonoscopy  2014  . Tubal ligation      History  Smoking status  . Current Every Day Smoker -- 1.00 packs/day for 25 years  . Types: Cigarettes  Smokeless tobacco  . Never Used    Comment: form given 10/19/14    History  Alcohol Use  . 0.0 oz/week  . 0 Standard drinks or equivalent per week    Comment: very rare    Family History  Problem Relation Age of Onset  . Hypertension Mother   . Diabetes Maternal Grandmother   . Colon cancer Neg Hx   . Lung cancer Mother   . Heart disease Father   . Heart attack Paternal Uncle   . Breast cancer      Reviw of Systems:  Reviewed in the HPI.  All other systems are negative.  Physical Exam: BP 122/76 mmHg  Pulse 82  Ht 5\' 6"  (1.676 m)  Wt 276 lb 1.9 oz (125.247 kg)  BMI 44.59 kg/m2  SpO2 93% The patient is alert and oriented x 3.  The mood and affect are  normal.   Skin: warm and dry.  Color is normal.   HEENT:   Normal carotids, no JVD, neck  is supple Lungs: scattered wheezes.  Heart: RR, normal    Abdomen: + BS, no HSM Extremities:  Bilateral leg edema ( trace )  bilateral   with chroinic stasis changes Neuro:  Non focal     ECG: Aug. 19, 2016:  NSR at 82.  Normal   Assessment / Plan:   1. Chest pain - normal cath in the past 2. Cigarette smoking. - I've encouraged her to stop smoking. 3. COPD -  4. Leg edema - continues to have  Trace  leg edema  5. Essential HTN:  BP is well controlled.   I've given her instructions about leg elevation and salt restriction. I have also encouraged her to lose weight.    Nahser, Wonda Cheng, MD  10/28/2015 11:30 AM    [facility] [name],  [place], [place]  [address] Pager [phone] Phone: [phone]; Fax: [phone]   [facility]  [address] [place], [place]  [address] [phone]   Fax [phone]

## 2015-11-30 ENCOUNTER — Other Ambulatory Visit: Payer: Self-pay | Admitting: Cardiovascular Disease

## 2015-12-03 NOTE — Telephone Encounter (Signed)
losartan (COZAAR) 100 MG tablet  Medication   Date: 10/28/2015  Department: [address] Office  Ordering/Authorizing: Thayer Headings, MD      Order Providers    Prescribing Provider Encounter Provider   Thayer Headings, MD Thayer Headings, MD    Medication Detail      Disp Refills Start End     losartan (COZAAR) 100 MG tablet 90 tablet 3 10/28/2015     Sig - Route: Take 1 tablet (100 mg total) by mouth daily. - Oral    E-Prescribing Status: Receipt confirmed by pharmacy (10/28/2015 11:47 AM EST)     Pharmacy    [id number] - [place], [place]

## 2015-12-17 ENCOUNTER — Ambulatory Visit (INDEPENDENT_AMBULATORY_CARE_PROVIDER_SITE_OTHER): Payer: Managed Care, Other (non HMO) | Admitting: Emergency Medicine

## 2015-12-17 ENCOUNTER — Encounter: Payer: Self-pay | Admitting: Emergency Medicine

## 2015-12-17 VITALS — BP 132/78 | HR 91 | Ht 66.0 in | Wt 286.0 lb

## 2015-12-17 DIAGNOSIS — R911 Solitary pulmonary nodule: Secondary | ICD-10-CM | POA: Diagnosis not present

## 2015-12-17 DIAGNOSIS — F17200 Nicotine dependence, unspecified, uncomplicated: Secondary | ICD-10-CM

## 2015-12-17 DIAGNOSIS — G4730 Sleep apnea, unspecified: Secondary | ICD-10-CM | POA: Diagnosis not present

## 2015-12-17 MED ORDER — BUPROPION HCL ER (SR) 150 MG PO TB12: ORAL_TABLET | ORAL | Status: DC

## 2015-12-17 NOTE — Assessment & Plan Note (Signed)
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 Repeat in August. Will need to stop smoking, focus on this.

## 2015-12-17 NOTE — Assessment & Plan Note (Signed)
disdussed smoking cessation in detail Planning to start wellbutrin for both smoking and her depression.  Note she has had problems with zoloft in the past

## 2015-12-17 NOTE — Patient Instructions (Signed)
Most important intervention we can make at this time is to help to decrease your smoking. We will set a goal of decreasing to 1 pack daily by your next visit. We will start Wellbutrin 150 mg daily for 3 days and then increase to 150 mg twice a day.  You'll need a repeat CT scan of her chest in August 2017 Follow with Dr Lamonte Sakai  Next available appointment

## 2015-12-17 NOTE — Progress Notes (Signed)
Subjective:    Patient ID: Lisa [name], female    DOB: May 14, 1961, 55 y.o.   MRN: [id number]  Shortness of Breath Pertinent negatives include no ear pain, fever, headaches, leg swelling, rash, rhinorrhea, sore throat, vomiting or wheezing.   55 yo smoker (30-40 pk-yrs) with OSA (on CPAP), allergies, HTN, non-cardiac CP, GERD and IBS. Followed and referred by L. Gerhardt with cardiology.  She has been seen for dyspnea. Chemical myocardial perfusion scan 4/7 > small reversible defer distal LAD distribution.  CXR 3/17 normal.  She is referred here to further eval her SOB. She has exertional muscle fatigue. No wheeze. Some cough, bothers her after a URI or with allergies. Also has GERD that isn't reliably treated. She used to be on lisinopril but changed to losartan. She had PFT 3/'15 that show primarily restriction.   ROV 09/27/14 -- former smoker follows up for dyspnea, mixed disease on PFT. She has seen Dr Acie Fredrickson who recommended exercise, wt loss. Did not repeat her L cath at that time. She has been having trouble with leg cramps - increased her potassium. She is noticing some dyspnea with climbing stairs. Doesn't happen every time.  She is still smoking but has tried to cut down.    ROV 01/18/15 -- follow-up visit for tobacco use, mixed obstruction and restriction. She also has a history of obstructive sleep apnea and is in need of a repeat polysomnogram. She has been stable until about 3 weeks ago when she had a URI, still having some cough. She is still smoking, is tearful because she was unable to stop as we had discussed. She ash also had a CT scan abd 01/01/15 that showed some air trapping and mediastinal LAD. She is having some exertional SOB with ambulation.   ROV 04/11/15 -- follow up visit dyspnea and obstructive sleep apnea on CPAP. She has mixed obstruction and restriction on spirometry. She underwent a CT scan of the chest on 2/25 to evaluate possible mediastinal lymphadenopathy. I have  reviewed the scan myself shows some mild scattered mediastinal and hilar nodes as well as some evidence of mild groundglass versus air trapping.  Also noted was a 5.5 mm right middle lobe pulmonary nodule that will need to be followed. She is tolerating her CPAP. Her breathing has overall been stable, but in the last few days she has noticed some instances when she had to stop. Her GERD has been active. Sometimes associated with some chest tightness.   ROV 08/12/15 -- follow-up visit for multifactorial dyspnea, tobacco abuse, obstructive sleep apnea on CPAP. She also has a 5.5 mm right middle lobe nodule that we have followed on CT scan of the chest. Her most recent CT scan was performed on 07/26/2015. I have personally reviewed the scan shows that her right middle lobe nodule stable compared with February. She also still has stable mosaic changes consistent with either a bronchiolitis or air trapping.  She is planning to set a cigarettes quit date in mid-October. Not on BD's, her breathing is unchanged, is able to walk. She had her CPAP titrated, but needs to get her device repaired. Good compliance but she doesn't feel that she can go without it long enough for them to repair her device.   ROV 12/17/15 -- hx of multifactorial dyspnea, tobacco abuse, obstructive sleep apnea on CPAP. She also has a 5.5 mm right middle lobe nodule and evidence for RB-ILD that we have followed on CT scan of the chest, next due in  August 2017.  She tells me that she has increased her smoking to 2 pks a day. She doesn't have a primary MD, does follow with Dr Acie Fredrickson. She describes sx of depression. She had trouble when she took Zoloft, more depression. She tells me that her breathing has been stable. She denies any increase in wheeze or mucous. Not currently taking BD's.    Review of Systems  Constitutional: Negative for fever and unexpected weight change.  HENT: Negative for congestion, dental problem, ear pain, nosebleeds,  postnasal drip, rhinorrhea, sinus pressure, sneezing, sore throat and trouble swallowing.   Eyes: Negative for redness and itching.  Respiratory: Positive for cough. Negative for chest tightness, shortness of breath and wheezing.   Cardiovascular: Negative for palpitations and leg swelling.  Gastrointestinal: Negative for nausea and vomiting.  Genitourinary: Negative for dysuria.  Musculoskeletal: Negative for joint swelling.  Skin: Negative for rash.  Neurological: Negative for headaches.  Hematological: Does not bruise/bleed easily.  Psychiatric/Behavioral: Negative for dysphoric mood. The patient is not nervous/anxious.         Objective:   Physical Exam Filed Vitals:   12/17/15 1137 12/17/15 1138  BP:  132/78  Pulse:  91  Height: 5\' 6"  (1.676 m)   Weight: 286 lb (129.729 kg)   SpO2:  97%   Gen: Pleasant, obese, in no distress,  normal affect  ENT: No lesions,  mouth clear, mild L ptosis  Neck: No JVD, no TMG, no carotid bruits  Lungs: No use of accessory muscles, clear without rales or rhonchi  Cardiovascular: RRR, heart sounds normal, no murmur or gallops, trace peripheral edema  Musculoskeletal: No deformities, no cyanosis or clubbing  Neuro: alert, non focal  Skin: Warm, no lesions or rashes     Assessment & Plan:  Tobacco use disorder disdussed smoking cessation in detail Planning to start wellbutrin for both smoking and her depression.  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 Repeat in August. Will need to stop smoking, focus on this.   Sleep apnea Not on CPAP.   over 80% of this 30 minute visit was spent in counseling regarding the several issues above

## 2016-01-15 ENCOUNTER — Ambulatory Visit: Payer: Managed Care, Other (non HMO) | Admitting: Family

## 2016-01-21 ENCOUNTER — Other Ambulatory Visit: Payer: Self-pay | Admitting: *Deleted

## 2016-01-21 MED ORDER — BUPROPION HCL ER (SR) 150 MG PO TB12: 150.0000 mg | ORAL_TABLET | Freq: Two times a day (BID) | ORAL | Status: DC

## 2016-01-29 ENCOUNTER — Encounter: Payer: Self-pay | Admitting: Gastroenterology

## 2016-01-29 ENCOUNTER — Ambulatory Visit (INDEPENDENT_AMBULATORY_CARE_PROVIDER_SITE_OTHER): Payer: Managed Care, Other (non HMO) | Admitting: Gastroenterology

## 2016-01-29 VITALS — BP 142/90 | HR 84 | Ht 66.0 in | Wt 277.4 lb

## 2016-01-29 DIAGNOSIS — K2970 Gastritis, unspecified, without bleeding: Secondary | ICD-10-CM

## 2016-01-29 DIAGNOSIS — K219 Gastro-esophageal reflux disease without esophagitis: Secondary | ICD-10-CM

## 2016-01-29 DIAGNOSIS — R05 Cough: Secondary | ICD-10-CM

## 2016-01-29 DIAGNOSIS — R059 Cough, unspecified: Secondary | ICD-10-CM

## 2016-01-29 MED ORDER — DICYCLOMINE HCL 10 MG PO CAPS: 10.0000 mg | ORAL_CAPSULE | Freq: Three times a day (TID) | ORAL | Status: DC | PRN

## 2016-01-29 MED ORDER — OMEPRAZOLE 40 MG PO CPDR: 40.0000 mg | DELAYED_RELEASE_CAPSULE | Freq: Two times a day (BID) | ORAL | Status: DC

## 2016-01-29 NOTE — Progress Notes (Signed)
    History of Present Illness: This is a 55 year old female complaining of frequent reflux symptoms and tickle in her throat along with a frequent cough. She is a cigarette smoker attempting to quit and has significant allergies and frequent sinus drainage. He takes dicyclomine intermittently for lower abdominal pain and bloating. She's been maintained on omeprazole once daily. She went underwent EGD in 2015 showing mild gastritis.   Current Medications, Allergies, Past Medical History, Past Surgical History, Family History and Social History were reviewed in [employer] record.  Physical Exam: General: Well developed, well nourished, obese, no acute distress Head: Normocephalic and atraumatic Eyes:  sclerae anicteric, EOMI Ears: Normal auditory acuity Mouth: No deformity or lesions Lungs: Clear throughout to auscultation Heart: Regular rate and rhythm; no murmurs, rubs or bruits Abdomen: Soft, non tender and non distended. No masses, hepatosplenomegaly or hernias noted. Normal Bowel sounds Musculoskeletal: Symmetrical with no gross deformities  Pulses:  Normal pulses noted Extremities: No clubbing, cyanosis, edema or deformities noted Neurological: Alert oriented x 4, grossly nonfocal Psychological:  Alert and cooperative. Normal mood and affect  Assessment and Recommendations:  1. GERD. Possibly leading to LPR symptoms. Increase omeprazole to 40 mg twice daily and intensify standard antireflux measures.  2. Cough and throat tickling. Likely multifactorial related to smoking and allergies. Possible contribution from GERD with LPR. Follow up with Dr Lamonte Sakai.   3. IBS. Continue dicyclomine tid when necessary. Avoid foods that trigger symptoms.

## 2016-01-29 NOTE — Patient Instructions (Signed)
Increase your omeprazole to 40 mg one tablet by mouth twice daily. We sent a new prescription to the pharmacy.   We have sent the following medications to your pharmacy for you to pick up at your convenience:Bentyl.  Patient advised to avoid spicy, acidic, citrus, chocolate, mints, fruit and fruit juices.  Limit the intake of caffeine, alcohol and Soda.  Don't exercise too soon after eating.  Don't lie down within 3-4 hours of eating.  Elevate the head of your bed.  Thank you for choosing me and [facility].  Pricilla Riffle. Dagoberto Ligas., MD., Marval Regal

## 2016-02-02 ENCOUNTER — Other Ambulatory Visit: Payer: Self-pay | Admitting: Gastroenterology

## 2016-02-02 ENCOUNTER — Other Ambulatory Visit: Payer: Self-pay | Admitting: Cardiovascular Disease

## 2016-02-04 ENCOUNTER — Ambulatory Visit: Payer: Managed Care, Other (non HMO) | Admitting: Emergency Medicine

## 2016-02-10 ENCOUNTER — Ambulatory Visit (INDEPENDENT_AMBULATORY_CARE_PROVIDER_SITE_OTHER): Payer: Managed Care, Other (non HMO) | Admitting: Adult Health

## 2016-02-10 ENCOUNTER — Telehealth: Payer: Self-pay | Admitting: Family

## 2016-02-10 ENCOUNTER — Encounter: Payer: Self-pay | Admitting: Adult Health

## 2016-02-10 VITALS — BP 134/88 | HR 79 | Temp 97.5°F | Ht 66.0 in | Wt 276.0 lb

## 2016-02-10 DIAGNOSIS — R911 Solitary pulmonary nodule: Secondary | ICD-10-CM | POA: Diagnosis not present

## 2016-02-10 DIAGNOSIS — J449 Chronic obstructive pulmonary disease, unspecified: Secondary | ICD-10-CM

## 2016-02-10 NOTE — Telephone Encounter (Signed)
Patient is set to establish care on the 29th.  She will be starting a new job on the 20th.  She is requesting Lisa [name] to work her in on Friday if possible.

## 2016-02-10 NOTE — Patient Instructions (Addendum)
Continue on current regimen.  Get CT chest in August as planned .  Goal for smoking -try to get down to 15 cigs by June 15th.  And down to 7 cigs by August . Ultimate goal is to be quit by the time we see you back in August.  Follow up with Dr. Lamonte Sakai  In August. And As needed

## 2016-02-10 NOTE — Telephone Encounter (Signed)
Got moved

## 2016-02-10 NOTE — Telephone Encounter (Signed)
Ok for Friday.

## 2016-02-14 ENCOUNTER — Telehealth: Payer: Self-pay | Admitting: Emergency Medicine

## 2016-02-14 ENCOUNTER — Encounter: Payer: Self-pay | Admitting: Family

## 2016-02-14 ENCOUNTER — Ambulatory Visit (INDEPENDENT_AMBULATORY_CARE_PROVIDER_SITE_OTHER): Payer: Managed Care, Other (non HMO) | Admitting: Family

## 2016-02-14 VITALS — BP 142/84 | HR 83 | Temp 98.1°F | Resp 18 | Ht 66.0 in | Wt 278.0 lb

## 2016-02-14 DIAGNOSIS — K219 Gastro-esophageal reflux disease without esophagitis: Secondary | ICD-10-CM

## 2016-02-14 DIAGNOSIS — F17200 Nicotine dependence, unspecified, uncomplicated: Secondary | ICD-10-CM | POA: Diagnosis not present

## 2016-02-14 DIAGNOSIS — I10 Essential (primary) hypertension: Secondary | ICD-10-CM | POA: Diagnosis not present

## 2016-02-14 NOTE — Progress Notes (Signed)
Subjective:    Patient ID: Lisa [name], female    DOB: Apr 07, 1961, 55 y.o.   MRN: [id number]  Shortness of Breath Pertinent negatives include no ear pain, fever, headaches, leg swelling, rash, rhinorrhea, sore throat, vomiting or wheezing.   55 yo smoker (30-40 pk-yrs) with OSA (on CPAP), allergies, HTN, non-cardiac CP, GERD and IBS. Followed and referred by Lisa [name] with cardiology.  She has been seen for dyspnea. Chemical myocardial perfusion scan 4/7 > small reversible defer distal LAD distribution.  CXR 3/17 normal.  She is referred here to further eval her SOB. She has exertional muscle fatigue. No wheeze. Some cough, bothers her after a URI or with allergies. Also has GERD that isn't reliably treated. She used to be on lisinopril but changed to losartan. She had PFT 3/'15 that show primarily restriction.   ROV 09/27/14 -- former smoker follows up for dyspnea, mixed disease on PFT. She has seen Lisa [name] who recommended exercise, wt loss. Did not repeat her L cath at that time. She has been having trouble with leg cramps - increased her potassium. She is noticing some dyspnea with climbing stairs. Doesn't happen every time.  She is still smoking but has tried to cut down.    ROV 01/18/15 -- follow-up visit for tobacco use, mixed obstruction and restriction. She also has a history of obstructive sleep apnea and is in need of a repeat polysomnogram. She has been stable until about 3 weeks ago when she had a URI, still having some cough. She is still smoking, is tearful because she was unable to stop as we had discussed. She ash also had a CT scan abd 01/01/15 that showed some air trapping and mediastinal LAD. She is having some exertional SOB with ambulation.   ROV 04/11/15 -- follow up visit dyspnea and obstructive sleep apnea on CPAP. She has mixed obstruction and restriction on spirometry. She underwent a CT scan of the chest on 2/25 to evaluate possible mediastinal lymphadenopathy. I have  reviewed the scan myself shows some mild scattered mediastinal and hilar nodes as well as some evidence of mild groundglass versus air trapping.  Also noted was a 5.5 mm right middle lobe pulmonary nodule that will need to be followed. She is tolerating her CPAP. Her breathing has overall been stable, but in the last few days she has noticed some instances when she had to stop. Her GERD has been active. Sometimes associated with some chest tightness.   ROV 08/12/15 -- follow-up visit for multifactorial dyspnea, tobacco abuse, obstructive sleep apnea on CPAP. She also has a 5.5 mm right middle lobe nodule that we have followed on CT scan of the chest. Her most recent CT scan was performed on 07/26/2015. I have personally reviewed the scan shows that her right middle lobe nodule stable compared with February. She also still has stable mosaic changes consistent with either a bronchiolitis or air trapping.  She is planning to set a cigarettes quit date in mid-October. Not on BD's, her breathing is unchanged, is able to walk. She had her CPAP titrated, but needs to get her device repaired. Good compliance but she doesn't feel that she can go without it long enough for them to repair her device.   ROV 12/17/15 -- hx of multifactorial dyspnea, tobacco abuse, obstructive sleep apnea on CPAP. She also has a 5.5 mm right middle lobe nodule and evidence for RB-ILD that we have followed on CT scan of the chest, next due in  August 2017.  She tells me that she has increased her smoking to 2 pks a day. She doesn't have a primary MD, does follow with Lisa [name]. She describes sx of depression. She had trouble when she took Zoloft, more depression. She tells me that her breathing has been stable. She denies any increase in wheeze or mucous. Not currently taking BD's.   02/10/16 Follow up : Cough, RB -ILD , Dyspnea.  Pt returns for 2 month follow up . Says over is doing okay. Does get intermittent dyspena , Some times stress and  anxiety /panic plays a role . Very excited she has cut back on smoking a lot . We talked about cessation with goal setting . She is going to slowly wean herself down and to off by end of summer.  Has planned CT in August to follow RML nodule .  Denies hemoptysis , chest pain, orthopnea or edema.      Review of Systems  Constitutional: Negative for fever and unexpected weight change.  HENT: Negative for congestion, dental problem, ear pain, nosebleeds, postnasal drip, rhinorrhea, sinus pressure, sneezing, sore throat and trouble swallowing.   Eyes: Negative for redness and itching.  Respiratory: Positive for cough. Negative for chest tightness, and wheezing.   Cardiovascular: Negative for palpitations and leg swelling.  Gastrointestinal: Negative for nausea and vomiting.  Genitourinary: Negative for dysuria.  Musculoskeletal: Negative for joint swelling.  Skin: Negative for rash.  Neurological: Negative for headaches.  Hematological: Does not bruise/bleed easily.  Psychiatric/Behavioral: Negative for dysphoric mood. The patient is not nervous/anxious.         Objective:   Physical Exam   Filed Vitals:   02/10/16 1200  BP: 134/88  Pulse: 79  Temp: 97.5 F (36.4 C)  TempSrc: Oral  Height: 5\' 6"  (1.676 m)  Weight: 276 lb (125.193 kg)  SpO2: 97%     Gen: Pleasant, obese, in no distress,  normal affect  ENT: No lesions,  mouth clear, mild L ptosis  Neck: No JVD, no TMG, no carotid bruits  Lungs: No use of accessory muscles, clear without rales or rhonchi  Cardiovascular: RRR, heart sounds normal, no murmur or gallops, trace peripheral edema  Musculoskeletal: No deformities, no cyanosis or clubbing  Neuro: alert, non focal  Skin: Warm, no lesions or rashes  Tammy Parrett NP-C  [facility]  02/10/16      Assessment & Plan:

## 2016-02-14 NOTE — Patient Instructions (Addendum)
Thank you for choosing [facility].  Summary/Instructions:  Please continue to take your medications as prescribed.   Please schedule a time for your physical at your convenience.   If your symptoms worsen or fail to improve, please contact our office for further instruction, or in case of emergency go directly to the emergency room at the closest medical facility.   Smoking Cessation, Tips for Success If you are ready to quit smoking, congratulations! You have chosen to help yourself be healthier. Cigarettes bring nicotine, tar, carbon monoxide, and other irritants into your body. Your lungs, heart, and blood vessels will be able to work better without these poisons. There are many different ways to quit smoking. Nicotine gum, nicotine patches, a nicotine inhaler, or nicotine nasal spray can help with physical craving. Hypnosis, support groups, and medicines help break the habit of smoking. WHAT THINGS CAN I DO TO MAKE QUITTING EASIER?  Here are some tips to help you quit for good:  Pick a date when you will quit smoking completely. Tell all of your friends and family about your plan to quit on that date.  Do not try to slowly cut down on the number of cigarettes you are smoking. Pick a quit date and quit smoking completely starting on that day.  Throw away all cigarettes.   Clean and remove all ashtrays from your home, work, and car.  On a card, write down your reasons for quitting. Carry the card with you and read it when you get the urge to smoke.  Cleanse your body of nicotine. Drink enough water and fluids to keep your urine clear or pale yellow. Do this after quitting to flush the nicotine from your body.  Learn to predict your moods. Do not let a bad situation be your excuse to have a cigarette. Some situations in your life might tempt you into wanting a cigarette.  Never have "just one" cigarette. It leads to wanting another and another. Remind yourself of your decision  to quit.  Change habits associated with smoking. If you smoked while driving or when feeling stressed, try other activities to replace smoking. Stand up when drinking your coffee. Brush your teeth after eating. Sit in a different chair when you read the paper. Avoid alcohol while trying to quit, and try to drink fewer caffeinated beverages. Alcohol and caffeine may urge you to smoke.  Avoid foods and drinks that can trigger a desire to smoke, such as sugary or spicy foods and alcohol.  Ask people who smoke not to smoke around you.  Have something planned to do right after eating or having a cup of coffee. For example, plan to take a walk or exercise.  Try a relaxation exercise to calm you down and decrease your stress. Remember, you may be tense and nervous for the first 2 weeks after you quit, but this will pass.  Find new activities to keep your hands busy. Play with a pen, coin, or rubber band. Doodle or draw things on paper.  Brush your teeth right after eating. This will help cut down on the craving for the taste of tobacco after meals. You can also try mouthwash.   Use oral substitutes in place of cigarettes. Try using lemon drops, carrots, cinnamon sticks, or chewing gum. Keep them handy so they are available when you have the urge to smoke.  When you have the urge to smoke, try deep breathing.  Designate your home as a nonsmoking area.  If you are  a heavy smoker, ask your health care provider about a prescription for nicotine chewing gum. It can ease your withdrawal from nicotine.  Reward yourself. Set aside the cigarette money you save and buy yourself something nice.  Look for support from others. Join a support group or smoking cessation program. Ask someone at home or at work to help you with your plan to quit smoking.  Always ask yourself, "Do I need this cigarette or is this just a reflex?" Tell yourself, "Today, I choose not to smoke," or "I do not want to smoke." You are  reminding yourself of your decision to quit.  Do not replace cigarette smoking with electronic cigarettes (commonly called e-cigarettes). The safety of e-cigarettes is unknown, and some may contain harmful chemicals.  If you relapse, do not give up! Plan ahead and think about what you will do the next time you get the urge to smoke. HOW WILL I FEEL WHEN I QUIT SMOKING? You may have symptoms of withdrawal because your body is used to nicotine (the addictive substance in cigarettes). You may crave cigarettes, be irritable, feel very hungry, cough often, get headaches, or have difficulty concentrating. The withdrawal symptoms are only temporary. They are strongest when you first quit but will go away within 10-14 days. When withdrawal symptoms occur, stay in control. Think about your reasons for quitting. Remind yourself that these are signs that your body is healing and getting used to being without cigarettes. Remember that withdrawal symptoms are easier to treat than the major diseases that smoking can cause.  Even after the withdrawal is over, expect periodic urges to smoke. However, these cravings are generally short lived and will go away whether you smoke or not. Do not smoke! WHAT RESOURCES ARE AVAILABLE TO HELP ME QUIT SMOKING? Your health care provider can direct you to community resources or hospitals for support, which may include:  Group support.  Education.  Hypnosis.  Therapy.   This information is not intended to replace advice given to you by your health care provider. Make sure you discuss any questions you have with your health care provider.   Document Released: 08/14/2004 Document Revised: 12/07/2014 Document Reviewed: 05/04/2013 Elsevier Interactive Patient Education 2016 [employer].  Steps to Quit Smoking  Smoking tobacco can be harmful to your health and can affect almost every organ in your body. Smoking puts you, and those around you, at risk for developing many  serious chronic diseases. Quitting smoking is difficult, but it is one of the best things that you can do for your health. It is never too late to quit. WHAT ARE THE BENEFITS OF QUITTING SMOKING? When you quit smoking, you lower your risk of developing serious diseases and conditions, such as:  Lung cancer or lung disease, such as COPD.  Heart disease.  Stroke.  Heart attack.  Infertility.  Osteoporosis and bone fractures. Additionally, symptoms such as coughing, wheezing, and shortness of breath may get better when you quit. You may also find that you get sick less often because your body is stronger at fighting off colds and infections. If you are pregnant, quitting smoking can help to reduce your chances of having a baby of low birth weight. HOW DO I GET READY TO QUIT? When you decide to quit smoking, create a plan to make sure that you are successful. Before you quit:  Pick a date to quit. Set a date within the next two weeks to give you time to prepare.  Write down  the reasons why you are quitting. Keep this list in places where you will see it often, such as on your bathroom mirror or in your car or wallet.  Identify the people, places, things, and activities that make you want to smoke (triggers) and avoid them. Make sure to take these actions:  Throw away all cigarettes at home, at work, and in your car.  Throw away smoking accessories, such as Scientist, research (medical).  Clean your car and make sure to empty the ashtray.  Clean your home, including curtains and carpets.  Tell your family, friends, and coworkers that you are quitting. Support from your loved ones can make quitting easier.  Talk with your health care provider about your options for quitting smoking.  Find out what treatment options are covered by your health insurance. WHAT STRATEGIES CAN I USE TO QUIT SMOKING?  Talk with your healthcare provider about different strategies to quit smoking. Some strategies  include:  Quitting smoking altogether instead of gradually lessening how much you smoke over a period of time. Research shows that quitting "cold [place]" is more successful than gradually quitting.  Attending in-person counseling to help you build problem-solving skills. You are more likely to have success in quitting if you attend several counseling sessions. Even short sessions of 10 minutes can be effective.  Finding resources and support systems that can help you to quit smoking and remain smoke-free after you quit. These resources are most helpful when you use them often. They can include:  Online chats with a Social worker.  Telephone quitlines.  Printed Furniture conservator/restorer.  Support groups or group counseling.  Text messaging programs.  Mobile phone applications.  Taking medicines to help you quit smoking. (If you are pregnant or breastfeeding, talk with your health care provider first.) Some medicines contain nicotine and some do not. Both types of medicines help with cravings, but the medicines that include nicotine help to relieve withdrawal symptoms. Your health care provider may recommend:  Nicotine patches, gum, or lozenges.  Nicotine inhalers or sprays.  Non-nicotine medicine that is taken by mouth. Talk with your health care provider about combining strategies, such as taking medicines while you are also receiving in-person counseling. Using these two strategies together makes you more likely to succeed in quitting than if you used either strategy on its own. If you are pregnant or breastfeeding, talk with your health care provider about finding counseling or other support strategies to quit smoking. Do not take medicine to help you quit smoking unless told to do so by your health care provider. WHAT THINGS CAN I DO TO MAKE IT EASIER TO QUIT? Quitting smoking might feel overwhelming at first, but there is a lot that you can do to make it easier. Take these important  actions:  Reach out to your family and friends and ask that they support and encourage you during this time. Call telephone quitlines, reach out to support groups, or work with a counselor for support.  Ask people who smoke to avoid smoking around you.  Avoid places that trigger you to smoke, such as bars, parties, or smoke-break areas at work.  Spend time around people who do not smoke.  Lessen stress in your life, because stress can be a smoking trigger for some people. To lessen stress, try:  Exercising regularly.  Deep-breathing exercises.  Yoga.  Meditating.  Performing a body scan. This involves closing your eyes, scanning your body from head to toe, and noticing which parts of your  body are particularly tense. Purposefully relax the muscles in those areas.  Download or purchase mobile phone or tablet apps (applications) that can help you stick to your quit plan by providing reminders, tips, and encouragement. There are many free apps, such as QuitGuide from the [employer] Office manager for Disease Control and Prevention). You can find other support for quitting smoking (smoking cessation) through smokefree.gov and other websites. HOW WILL I FEEL WHEN I QUIT SMOKING? Within the first 24 hours of quitting smoking, you may start to feel some withdrawal symptoms. These symptoms are usually most noticeable 2-3 days after quitting, but they usually do not last beyond 2-3 weeks. Changes or symptoms that you might experience include:  Mood swings.  Restlessness, anxiety, or irritation.  Difficulty concentrating.  Dizziness.  Strong cravings for sugary foods in addition to nicotine.  Mild weight gain.  Constipation.  Nausea.  Coughing or a sore throat.  Changes in how your medicines work in your body.  A depressed mood.  Difficulty sleeping (insomnia). After the first 2-3 weeks of quitting, you may start to notice more positive results, such as:  Improved sense of smell and  taste.  Decreased coughing and sore throat.  Slower heart rate.  Lower blood pressure.  Clearer skin.  The ability to breathe more easily.  Fewer sick days. Quitting smoking is very challenging for most people. Do not get discouraged if you are not successful the first time. Some people need to make many attempts to quit before they achieve long-term success. Do your best to stick to your quit plan, and talk with your health care provider if you have any questions or concerns.   This information is not intended to replace advice given to you by your health care provider. Make sure you discuss any questions you have with your health care provider.   Document Released: 11/10/2001 Document Revised: 04/02/2015 Document Reviewed: 04/02/2015 Elsevier Interactive Patient Education [employer].

## 2016-02-14 NOTE — Progress Notes (Signed)
Pre visit review using our clinic review tool, if applicable. No additional management support is needed unless otherwise documented below in the visit note. 

## 2016-02-14 NOTE — Telephone Encounter (Signed)
Spoke with pt, advised that 92% is a normal reading for her 02 levels, advised that cold temps outside could affect her readings also.  Pt expressed understanding.  Nothing further needed.

## 2016-02-14 NOTE — Assessment & Plan Note (Signed)
Continue on current regimen.    Goal for smoking -try to get down to 15 cigs by June 15th.  And down to 7 cigs by August . Ultimate goal is to be quit by the time we see you back in August.  Follow up with Dr. Lamonte Sakai  In August. And As needed

## 2016-02-14 NOTE — Assessment & Plan Note (Signed)
Controlled without flare   Plan  Continue on current regimen.  Get CT chest in August as planned .  Goal for smoking -try to get down to 15 cigs by June 15th.  And down to 7 cigs by August . Ultimate goal is to be quit by the time we see you back in August.  Follow up with Dr. Lamonte Sakai  In August. And As needed

## 2016-02-14 NOTE — Progress Notes (Signed)
Subjective:    Patient ID: Lisa [name], female    DOB: August 11, 1961, 55 y.o.   MRN: [id number]  Chief Complaint  Patient presents with  . Establish Care    HPI:  Lisa [name] is a 55 y.o. female who  has a past medical history of Hypertension; Chest pain, non-cardiac; Obesity; Cigarette smoker; IBS (irritable bowel syndrome); GERD (gastroesophageal reflux disease); Diverticulosis; Pyloric erosion; Allergic rhinitis; Sleep apnea; Chronic headaches; Breast nodule; Complication of anesthesia (05-17-2012); Hepatic hemangioma; and Asthma. and presents today for an office visit to establish care.   1.) Hypertension - Currently maintained on losartan and carvedilol. Reports taking medication as prescribed and denies adverse side effects. Denies symptoms of end organ damage. Blood pressure at home is generally below goal of 140/90. She has not taken her medication today.   BP Readings from Last 3 Encounters:  02/14/16 142/84  02/10/16 134/88  01/29/16 142/90    2.) GERD - currently maintained on omeprazole. Reports taking the medication as prescribed and denies adverse side effects. Her symptoms are controlled and is currently managed by gastroenterology.   3.) Tobacco cessation - Currently working on tobacco cessation and managed with bupropion. Reports that she is down to 1 pack plus 4 cigarettes per day and is currently working on decreasing more. Reports taking the medication as prescribed and denies adverse side effects.  Allergies  Allergen Reactions  . Hctz [Hydrochlorothiazide]     Causes a headache  . Lisinopril Cough    Cough   . Morphine And Related     Moodiness.  Causes anger.  . Other     guaifensen             Restlessness, jittery  . Percocet [Oxycodone-Acetaminophen] Other (See Comments)     Outpatient Prescriptions Prior to Visit  Medication Sig Dispense Refill  . buPROPion (WELLBUTRIN SR) 150 MG 12 hr tablet Take 1 tablet (150 mg total) by mouth 2 (two)  times daily. 180 tablet 0  . carvedilol (COREG) 25 MG tablet Take 1 tablet (25 mg total) by mouth 2 (two) times daily. 180 tablet 3  . dicyclomine (BENTYL) 10 MG capsule Take 1 capsule (10 mg total) by mouth 3 (three) times daily as needed for spasms. 90 capsule 11  . furosemide (LASIX) 40 MG tablet Take 1 tablet (40 mg total) by mouth daily. 90 tablet 3  . HYDROcodone-acetaminophen (NORCO/VICODIN) 5-325 MG per tablet Take 1-2 tablets by mouth every 4 (four) hours as needed. 20 tablet 0  . losartan (COZAAR) 100 MG tablet Take 1 tablet (100 mg total) by mouth daily. 90 tablet 3  . omeprazole (PRILOSEC) 40 MG capsule TAKE 1 CAPSULE (40 MG TOTAL) BY MOUTH DAILY. (Patient taking differently: TAKE 1 CAPSULE (80 MG TOTAL) BY MOUTH DAILY.) 180 capsule 1  . potassium chloride (K-DUR) 10 MEQ tablet Take 1 tablet (10 mEq total) by mouth 2 (two) times daily. 180 tablet 3   No facility-administered medications prior to visit.     Past Medical History  Diagnosis Date  . Hypertension   . Chest pain, non-cardiac   . Obesity   . Cigarette smoker   . IBS (irritable bowel syndrome)   . GERD (gastroesophageal reflux disease)   . Diverticulosis   . Pyloric erosion   . Allergic rhinitis   . Sleep apnea     CPAP setting varies from 4-10  . Chronic headaches   . Breast nodule     right breast, to see  dr Helane Rima 06-20-2012 for   . Complication of anesthesia 05-17-2012    trouble breathing after colonscopy, needed nebulizer  . Hepatic hemangioma   . Asthma      Past Surgical History  Procedure Laterality Date  . Cesarean section  2003, 2009    x 2  . Laparoscopy    . Umbilical hernia repair  2004  . US echocardiography  05/08/2008    EF 55-60%  . Cardiovascular stress test  03/25/2007    EF 78%  . Endometrial ablation  09/2010    and D&C  . Cardiac catheterization  12/28/2007    EF 75%. IT REVEALS NORMAL/SUPRANORMAL LEFT VENTRICULAR SYSTOLIC FUNCTION  . Cholecystectomy  1993  . Hernia repair  [id number]     umbilical  . Ventral hernia repair  06/15/2012    Procedure: LAPAROSCOPIC VENTRAL HERNIA;  Surgeon: Edward Jolly, MD;  Location: [facility];  Service: General;  Laterality: N/A;  LAPAROSCOPIC REPAIR VENTRAL HERNIA  . Colonoscopy  2014  . Tubal ligation       Family History  Problem Relation Age of Onset  . Hypertension Mother   . Lung cancer Mother   . Diabetes Maternal Grandmother   . Colon cancer Neg Hx   . Heart disease Father   . Heart attack Paternal Uncle   . Breast cancer       Social History   Social History  . Marital Status: Married    Spouse Name: [name]  . Number of Children: 2  . Years of Education: 14+   Occupational History  . 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 Smoking status: Current Some Day Smoker -- 2.00 packs/day for 25 years    Types: Cigarettes  . Smokeless tobacco: Never Used     Comment: form given 10/19/14, 01-29-16  . Alcohol Use: 0.0 oz/week    0 Standard drinks or equivalent per week     Comment: very rare  . Drug Use: No  . Sexual Activity: Not Currently   Other Topics Concern  . Not on file   Social History Narrative   Patient lives at her home with her family.   Caffeine Use: 1 soda occasionally      Review of Systems  Constitutional: Negative for fever and chills.  Respiratory: Negative for chest tightness and shortness of breath.   Cardiovascular: Negative for chest pain, palpitations and leg swelling.  Neurological: Negative for dizziness, weakness and headaches.      Objective:    BP 142/84 mmHg  Pulse 83  Temp(Src) 98.1 F (36.7 C) (Oral)  Resp 18  Ht 5\' 6"  (1.676 m)  Wt 278 lb (126.1 kg)  BMI 44.89 kg/m2  SpO2 92% Nursing note and vital signs reviewed.  Physical Exam  Constitutional: She is oriented to person, place, and time. She appears well-developed and well-nourished. No distress.  Cardiovascular: Normal rate, regular rhythm, normal heart sounds and intact distal pulses.    Pulmonary/Chest: Effort normal and breath sounds normal.  Neurological: She is alert and oriented to person, place, and time.  Skin: Skin is warm and dry.  Psychiatric: She has a normal mood and affect. Her behavior is normal. Judgment and thought content normal.       Assessment & Plan:   Problem List Items Addressed This Visit      Cardiovascular and Mediastinum   HTN (hypertension)    Hypertension slightly elevated today above goal 140/90  with home readings less than 140/90 on average. Encouraged to continue to work on lifestyle management and nutrition to help lower her blood pressure. Continue to monitor blood pressure at home. Continue current dosage of losartan and carvedilol. Furosemide as needed for edema. Follow-up in 3 months or sooner.        Digestive   GERD (gastroesophageal reflux disease) - Primary    Symptoms of reflux currently stable and maintained on omeprazole. No adverse side effects noted. Continue current dosage of omeprazole with changes per gastroenterology.        Other   Tobacco use disorder    Currently working to decrease tobacco use and maintained on Wellbutrin to assist. She continues to smoke approximately one pack +4 cigarettes per day. Information regarding tobacco cessation and tips for 6 has provided. Encouraged patient to continue to wean off of cigarettes. Follow-up as needed.      Morbid obesity ([place])    Morbid obesity with BMI of 44. Encouraged increase physical activity to goal of 30 minutes of moderate level activity daily. Discussed importance of nutritional intake that is balanced, varied, and moderate and focused on nutrient dense foods and is low in saturated fats and sugary/processed foods. Recommended weight loss goal of 5-10% of current body weight to start. Follow-up in 6 months.        I am having Ms. Cooksey maintain her HYDROcodone-acetaminophen, carvedilol, furosemide, potassium chloride, losartan, buPROPion, dicyclomine, and  omeprazole.

## 2016-02-14 NOTE — Assessment & Plan Note (Signed)
Symptoms of reflux currently stable and maintained on omeprazole. No adverse side effects noted. Continue current dosage of omeprazole with changes per gastroenterology.

## 2016-02-14 NOTE — Assessment & Plan Note (Signed)
Hypertension slightly elevated today above goal 140/90 with home readings less than 140/90 on average. Encouraged to continue to work on lifestyle management and nutrition to help lower her blood pressure. Continue to monitor blood pressure at home. Continue current dosage of losartan and carvedilol. Furosemide as needed for edema. Follow-up in 3 months or sooner.

## 2016-02-14 NOTE — Assessment & Plan Note (Signed)
Morbid obesity with BMI of 44. Encouraged increase physical activity to goal of 30 minutes of moderate level activity daily. Discussed importance of nutritional intake that is balanced, varied, and moderate and focused on nutrient dense foods and is low in saturated fats and sugary/processed foods. Recommended weight loss goal of 5-10% of current body weight to start. Follow-up in 6 months.

## 2016-02-14 NOTE — Assessment & Plan Note (Signed)
Currently working to decrease tobacco use and maintained on Wellbutrin to assist. She continues to smoke approximately one pack +4 cigarettes per day. Information regarding tobacco cessation and tips for 6 has provided. Encouraged patient to continue to wean off of cigarettes. Follow-up as needed.

## 2016-02-26 ENCOUNTER — Ambulatory Visit: Payer: Managed Care, Other (non HMO) | Admitting: Family

## 2016-02-28 ENCOUNTER — Other Ambulatory Visit (INDEPENDENT_AMBULATORY_CARE_PROVIDER_SITE_OTHER): Payer: Managed Care, Other (non HMO)

## 2016-02-28 ENCOUNTER — Encounter: Payer: Self-pay | Admitting: Internal Medicine

## 2016-02-28 ENCOUNTER — Other Ambulatory Visit: Payer: Managed Care, Other (non HMO)

## 2016-02-28 ENCOUNTER — Ambulatory Visit: Payer: Managed Care, Other (non HMO) | Admitting: Family Medicine

## 2016-02-28 ENCOUNTER — Ambulatory Visit (INDEPENDENT_AMBULATORY_CARE_PROVIDER_SITE_OTHER): Payer: Managed Care, Other (non HMO) | Admitting: Internal Medicine

## 2016-02-28 VITALS — BP 140/77 | HR 77 | Temp 98.2°F | Resp 20 | Wt 278.0 lb

## 2016-02-28 DIAGNOSIS — R109 Unspecified abdominal pain: Secondary | ICD-10-CM | POA: Insufficient documentation

## 2016-02-28 DIAGNOSIS — J449 Chronic obstructive pulmonary disease, unspecified: Secondary | ICD-10-CM

## 2016-02-28 DIAGNOSIS — R1030 Lower abdominal pain, unspecified: Secondary | ICD-10-CM

## 2016-02-28 DIAGNOSIS — R300 Dysuria: Secondary | ICD-10-CM

## 2016-02-28 DIAGNOSIS — I10 Essential (primary) hypertension: Secondary | ICD-10-CM

## 2016-02-28 LAB — CBC WITH DIFFERENTIAL/PLATELET
BASOS ABS: 0 10*3/uL (ref 0.0–0.1)
Basophils Relative: 0.4 % (ref 0.0–3.0)
Eosinophils Absolute: 0.2 10*3/uL (ref 0.0–0.7)
Eosinophils Relative: 1.6 % (ref 0.0–5.0)
HCT: 45.4 % (ref 36.0–46.0)
Hemoglobin: 15.4 g/dL — ABNORMAL HIGH (ref 12.0–15.0)
LYMPHS ABS: 2.2 10*3/uL (ref 0.7–4.0)
LYMPHS PCT: 21.9 % (ref 12.0–46.0)
MCHC: 34 g/dL (ref 30.0–36.0)
MCV: 86.4 fl (ref 78.0–100.0)
MONOS PCT: 5.5 % (ref 3.0–12.0)
Monocytes Absolute: 0.6 10*3/uL (ref 0.1–1.0)
NEUTROS PCT: 70.6 % (ref 43.0–77.0)
Neutro Abs: 7.2 10*3/uL (ref 1.4–7.7)
Platelets: 281 10*3/uL (ref 150.0–400.0)
RBC: 5.25 Mil/uL — AB (ref 3.87–5.11)
RDW: 14.2 % (ref 11.5–15.5)
WBC: 10.3 10*3/uL (ref 4.0–10.5)

## 2016-02-28 LAB — URINALYSIS, ROUTINE W REFLEX MICROSCOPIC
Bilirubin Urine: NEGATIVE
KETONES UR: NEGATIVE
LEUKOCYTES UA: NEGATIVE
NITRITE: NEGATIVE
PH: 5.5 (ref 5.0–8.0)
Specific Gravity, Urine: 1.02 (ref 1.000–1.030)
TOTAL PROTEIN, URINE-UPE24: NEGATIVE
URINE GLUCOSE: NEGATIVE
Urobilinogen, UA: 0.2 (ref 0.0–1.0)

## 2016-02-28 LAB — POCT URINALYSIS DIPSTICK
BILIRUBIN UA: NEGATIVE
GLUCOSE UA: NEGATIVE
KETONES UA: NEGATIVE
Leukocytes, UA: NEGATIVE
Nitrite, UA: NEGATIVE
Protein, UA: NEGATIVE
SPEC GRAV UA: 1.025
Urobilinogen, UA: NEGATIVE
pH, UA: 6

## 2016-02-28 LAB — BASIC METABOLIC PANEL
BUN: 22 mg/dL (ref 6–23)
CALCIUM: 9.9 mg/dL (ref 8.4–10.5)
CO2: 29 mEq/L (ref 19–32)
CREATININE: 1.18 mg/dL (ref 0.40–1.20)
Chloride: 104 mEq/L (ref 96–112)
GFR: 61.75 mL/min (ref >60.00)
Glucose, Bld: 109 mg/dL — ABNORMAL HIGH (ref 70–99)
POTASSIUM: 4 meq/L (ref 3.5–5.1)
Sodium: 139 mEq/L (ref 135–145)

## 2016-02-28 LAB — HEPATIC FUNCTION PANEL
ALT: 18 U/L (ref 0–35)
AST: 12 U/L (ref 0–37)
Albumin: 4 g/dL (ref 3.5–5.2)
Alkaline Phosphatase: 88 U/L (ref 39–117)
BILIRUBIN TOTAL: 0.4 mg/dL (ref 0.2–1.2)
Bilirubin, Direct: 0.1 mg/dL (ref 0.0–0.3)
Total Protein: 7.9 g/dL (ref 6.0–8.3)

## 2016-02-28 LAB — LIPASE: LIPASE: 16 U/L (ref 11.0–59.0)

## 2016-02-28 MED ORDER — TRAMADOL HCL 50 MG PO TABS: 50.0000 mg | ORAL_TABLET | Freq: Three times a day (TID) | ORAL | Status: DC | PRN

## 2016-02-28 MED ORDER — LEVOFLOXACIN 500 MG PO TABS: 500.0000 mg | ORAL_TABLET | Freq: Every day | ORAL | Status: DC

## 2016-02-28 MED ORDER — METRONIDAZOLE 250 MG PO TABS: 250.0000 mg | ORAL_TABLET | Freq: Three times a day (TID) | ORAL | Status: DC

## 2016-02-28 NOTE — Progress Notes (Signed)
Subjective:    Patient ID: Lisa [name], female    DOB: [birth year], 55 y.o.   MRN: [id number]  HPI  Here with 4 days onset worsening lower abd pain with urinary freq, was mild to start, now mod to severe and finally forced here to miss time from her new job to come here, no radiation, has some improvemeent for a few minutes post urination, saw some "white stuff" in the pee but no gross hematuria, has hx of microhematutria , see sDr Grapey, s/p 2 cystoscopy neg for acute, is due for yearly f/u soon.  Has felt warm, not sure about fever, no rigors or chills, Denies urinary symptoms such as dysuria, flank pain, hematuria or n/v, fever, chills.Denies worsening reflux, abd pain, dysphagia, n/v, bowel change or blood. S/p EGD and colonoscopy per pt 2015, with diverticulosis.  Takes bentyl daily for LLQ spasms.  LMP many years ago s/p endomet ablation 2011.   Past Medical History  Diagnosis Date  . Hypertension   . Chest pain, non-cardiac   . Obesity   . Cigarette smoker   . IBS (irritable bowel syndrome)   . GERD (gastroesophageal reflux disease)   . Diverticulosis   . Pyloric erosion   . Allergic rhinitis   . Sleep apnea     CPAP setting varies from 4-10  . Chronic headaches   . Breast nodule     right breast, to see dr Helane Rima 06-20-2012 for   . Complication of anesthesia 05-17-2012    trouble breathing after colonscopy, needed nebulizer  . Hepatic hemangioma   . Asthma    Past Surgical History  Procedure Laterality Date  . Cesarean section  2003, 2009    x 2  . Laparoscopy    . Umbilical hernia repair  2004  . US echocardiography  05/08/2008    EF 55-60%  . Cardiovascular stress test  03/25/2007    EF 78%  . Endometrial ablation  09/2010    and D&C  . Cardiac catheterization  12/28/2007    EF 75%. IT REVEALS NORMAL/SUPRANORMAL LEFT VENTRICULAR SYSTOLIC FUNCTION  . Cholecystectomy  1993  . Hernia repair  [id number]    umbilical  . Ventral hernia repair  06/15/2012    Procedure:  LAPAROSCOPIC VENTRAL HERNIA;  Surgeon: Edward Jolly, MD;  Location: [facility];  Service: General;  Laterality: N/A;  LAPAROSCOPIC REPAIR VENTRAL HERNIA  . Colonoscopy  2014  . Tubal ligation      reports that she has been smoking Cigarettes.  She has a 50 pack-year smoking history. She has never used smokeless tobacco. She reports that she drinks alcohol. She reports that she does not use illicit drugs. family history includes Diabetes in her maternal grandmother; Heart attack in her paternal uncle; Heart disease in her father; Hypertension in her mother; Lung cancer in her mother. There is no history of Colon cancer. Allergies  Allergen Reactions  . Hctz [Hydrochlorothiazide]     Causes a headache  . Lisinopril Cough    Cough   . Morphine And Related     Moodiness.  Causes anger.  . Other     guaifensen             Restlessness, jittery  . Percocet [Oxycodone-Acetaminophen] Other (See Comments)   Current Outpatient Prescriptions on File Prior to Visit  Medication Sig Dispense Refill  . buPROPion (WELLBUTRIN SR) 150 MG 12 hr tablet Take 1 tablet (150 mg total) by mouth 2 (two) times daily. Tensed  tablet 0  . carvedilol (COREG) 25 MG tablet Take 1 tablet (25 mg total) by mouth 2 (two) times daily. 180 tablet 3  . dicyclomine (BENTYL) 10 MG capsule Take 1 capsule (10 mg total) by mouth 3 (three) times daily as needed for spasms. 90 capsule 11  . furosemide (LASIX) 40 MG tablet Take 1 tablet (40 mg total) by mouth daily. 90 tablet 3  . HYDROcodone-acetaminophen (NORCO/VICODIN) 5-325 MG per tablet Take 1-2 tablets by mouth every 4 (four) hours as needed. 20 tablet 0  . losartan (COZAAR) 100 MG tablet Take 1 tablet (100 mg total) by mouth daily. 90 tablet 3  . omeprazole (PRILOSEC) 40 MG capsule TAKE 1 CAPSULE (40 MG TOTAL) BY MOUTH DAILY. (Patient taking differently: TAKE 1 CAPSULE (80 MG TOTAL) BY MOUTH DAILY.) 180 capsule 1  . potassium chloride (K-DUR) 10 MEQ tablet Take 1 tablet (10 mEq  total) by mouth 2 (two) times daily. 180 tablet 3   No current facility-administered medications on file prior to visit.   Review of Systems  Constitutional: Negative for unusual diaphoresis or night sweats HENT: Negative for ear swelling or discharge Eyes: Negative for worsening visual haziness  Respiratory: Negative for choking and stridor.   Gastrointestinal: Negative for distension or worsening eructation Genitourinary: Negative for retention or change in urine volume.  Musculoskeletal: Negative for other MSK pain or swelling Skin: Negative for color change and worsening wound Neurological: Negative for tremors and numbness other than noted  Psychiatric/Behavioral: Negative for decreased concentration or agitation other than above       Objective:   Physical Exam BP 140/77 mmHg  Pulse 77  Temp(Src) 98.2 F (36.8 C) (Oral)  Resp 20  Wt 278 lb (126.1 kg)  SpO2 94% VS noted,  Constitutional: Pt appears in no apparent distress HENT: Head: NCAT.  Right Ear: External ear normal.  Left Ear: External ear normal.  Eyes: . Pupils are equal, round, and reactive to light. Conjunctivae and EOM are normal Neck: Normal range of motion. Neck supple.  Cardiovascular: Normal rate and regular rhythm.   Pulmonary/Chest: Effort normal and breath sounds without rales or wheezing.  Abd:  Soft, ND, + BS, but mod tender low mid abd without guarding or rebound, appear quite uncomfortable, no flank tender bilat Neurological: Pt is alert. Not confused , motor grossly intact Skin: Skin is warm. No rash, no LE edema Psychiatric: Pt behavior is normal. No agitation. mild nervous   Color, UA  yellow       Clarity, UA  cloudy       Glucose, UA  negative NEGATIVE NEGATIVE NEGATIVE    Bilirubin, UA  negative       Ketones, UA  negative       Spec Grav, UA  1.025       Blood, UA  1+       pH, UA  6.0       Protein, UA  negative       Urobilinogen, UA  negative 1.0 1.0 0.2    Nitrite, UA   negative       Leukocytes, UA Negative  Negative               Assessment & Plan:

## 2016-02-28 NOTE — Patient Instructions (Signed)
You had the pain shot (toradol) and the antibiotic shot (rocephin) today in the office  Please take all new medication as prescribed - the pain medication, as well as the 2 antibiotics (levaquin and flagyl)  Please continue all other medications as before, and refills have been done if requested.  Please have the pharmacy call with any other refills you may need.  Please keep your appointments with your specialists as you may have planned  Your urine specimen in the office today was negative, but I still suspect a urinary or maybe even colon problem (though this seems less likely); Please go to the LAB in the Basement (turn left off the elevator) for the tests to be done today  Your specimen will be sent for culture as well, but this takes 2-3 days to return  Please go to ER over the weekend for any worsening pain, fever, blood or other unsual problems (like weakness or falls)

## 2016-02-28 NOTE — Assessment & Plan Note (Signed)
stable overall by history and exam, recent data reviewed with pt, and pt to continue medical treatment as before,  to f/u any worsening symptoms or concerns SpO2 Readings from Last 3 Encounters:  02/28/16 94%  02/14/16 92%  02/10/16 97%

## 2016-02-28 NOTE — Assessment & Plan Note (Signed)
Low midline probable cystitis by hx but pain quite mod to severe, seems out of proportion to Udip neg and does not appear to be panicked; cant r/o other such as diverticulitis vs abscess, pt declines CT for now, for pain med, and empiric flagyl asd, pt to go to ER for persistent or worsening

## 2016-02-28 NOTE — Assessment & Plan Note (Signed)
stable overall by history and exam, recent data reviewed with pt, and pt to continue medical treatment as before,  to f/u any worsening symptoms or concerns BP Readings from Last 3 Encounters:  02/28/16 140/77  02/14/16 142/84  02/10/16 134/88

## 2016-02-28 NOTE — Progress Notes (Signed)
Pre visit review using our clinic review tool, if applicable. No additional management support is needed unless otherwise documented below in the visit note. 

## 2016-02-28 NOTE — Assessment & Plan Note (Addendum)
udip neg, but clinically possible uti vs other, for toradol/rocephin IM, empiric levaquin asd, urine cx,  to f/u any worsening symptoms or concerns  Note:  Total time for pt hx, exam, review of record with pt in the room, determination of diagnoses and plan for further eval and tx is > 40 min, with over 50% spent in coordination and counseling of patient

## 2016-03-01 ENCOUNTER — Encounter: Payer: Self-pay | Admitting: Internal Medicine

## 2016-03-01 LAB — URINE CULTURE

## 2016-04-03 ENCOUNTER — Telehealth: Payer: Self-pay | Admitting: Gastroenterology

## 2016-04-03 MED ORDER — OMEPRAZOLE 40 MG PO CPDR: 40.0000 mg | DELAYED_RELEASE_CAPSULE | Freq: Two times a day (BID) | ORAL | Status: DC

## 2016-04-03 NOTE — Telephone Encounter (Signed)
Patient states her pharmacy told her that she cannot refill her omeprazole early unless the doctor's office sends a new script. Informed patient that I sent the prescription to her pharmacy for twice daily dosing. Patient verbalized understanding.

## 2016-04-06 ENCOUNTER — Telehealth: Payer: Self-pay | Admitting: Gastroenterology

## 2016-04-06 MED ORDER — OMEPRAZOLE 40 MG PO CPDR: 40.0000 mg | DELAYED_RELEASE_CAPSULE | Freq: Two times a day (BID) | ORAL | Status: DC

## 2016-04-06 NOTE — Telephone Encounter (Signed)
Prescription resent for a 90 day supply.

## 2016-04-07 ENCOUNTER — Telehealth: Payer: Self-pay | Admitting: Family

## 2016-04-07 ENCOUNTER — Encounter: Payer: Self-pay | Admitting: Family Medicine

## 2016-04-07 ENCOUNTER — Ambulatory Visit (INDEPENDENT_AMBULATORY_CARE_PROVIDER_SITE_OTHER): Payer: Managed Care, Other (non HMO) | Admitting: Family Medicine

## 2016-04-07 VITALS — BP 130/80 | HR 82 | Temp 98.2°F | Wt 280.0 lb

## 2016-04-07 DIAGNOSIS — R109 Unspecified abdominal pain: Secondary | ICD-10-CM | POA: Diagnosis not present

## 2016-04-07 LAB — POC URINALSYSI DIPSTICK (AUTOMATED)
Bilirubin, UA: NEGATIVE
Glucose, UA: NEGATIVE
KETONES UA: NEGATIVE
LEUKOCYTES UA: NEGATIVE
NITRITE UA: NEGATIVE
Spec Grav, UA: 1.02
UROBILINOGEN UA: 1
pH, UA: 5.5

## 2016-04-07 NOTE — Patient Instructions (Signed)
For right mid back pain- you do have some spasm in this area and I suspect this is musculoskeletal and not internal. I worry about the blood in your urine for kidney stones but this has happened before many times and you have had extensive workup. I do think its reasonable to get in with urology if this pain worsens or you see blood in your urine for x-ray or Ct.   Otherwise, I would just trial the valium perhaps 1/2 tablet before bed to see if it can get you through the night. Consider massage therapy. Go ahead and schedule a 2 week follow up with Terri Piedra today before you leave  If new or worsening symptoms please see [name] sooner

## 2016-04-07 NOTE — Telephone Encounter (Signed)
Noted  

## 2016-04-07 NOTE — Telephone Encounter (Signed)
Patient Name: Lisa [name]  DOB: 1961-10-10    Initial Comment Caller states she's having pain on her right side. Temp- 95.9. But she feels so hot. Pain right below the rib cage.   Nurse Assessment  Nurse: Mallie Mussel, RN, Alveta Heimlich Date/Time Eilene Ghazi Time): 04/07/2016 8:44:02 AM  Confirm and document reason for call. If symptomatic, describe symptoms. You must click the next button to save text entered. ---Caller states that she has right side which began for the past 3 weeks. She rates her pain as ( call was lost at this point. I call back x 2 and received her voicemail each time. I left a message that I will call back shortly).  Has the patient traveled out of the country within the last 30 days? ---Not Applicable  Does the patient have any new or worsening symptoms? ---Yes  Will a triage be completed? ---No  Select reason for no triage. ---Other    Nurse: Mallie Mussel, RN, Alveta Heimlich Date/Time (Eastern Time): 04/07/2016 9:07:58 AM  Confirm and document reason for call. If symptomatic, describe symptoms. You must click the next button to save text entered. ---Caller states that she has right side which began for the past 3 weeks. She rates her pain as 7 on 0-10 scale and she is urinating frequently. Denies burning/pain with urination. Denies blood in her urine. She is not at home now to where she could take her temp again. She feels warm to touch, but admits her hands are cold.  Has the patient traveled out of the country within the last 30 days? ---No  Does the patient have any new or worsening symptoms? ---Yes  Will a triage be completed? ---Yes  Related visit to physician within the last 2 weeks? ---No  Does the PT have any chronic conditions? (i.e. diabetes, asthma, etc.) ---Yes  List chronic conditions. ---HTN  Is the patient pregnant or possibly pregnant? (Ask all females between the ages of 79-55) ---No  Is this a behavioral health or substance abuse call? ---No     Guidelines    Guideline Title Affirmed  Question Affirmed Notes  Flank Pain MODERATE pain (e.g., interferes with normal activities or awakens from sleep)    Final Disposition User   See Physician within [place], Therapist, sports, Alveta Heimlich    Comments  Sent message to nurse and parked caller  Dr. Elna Breslow did not have any appointments available and the only appointment available at all was 6:00pm. She wanted to be seen sooner than that. I checked Humberto Seals and he didn't have an appointment available until 3:00pm. She states that she can't be seen then due to having to pick up her daughters from school. I was able to schedule her an 11:15am appointment at [facility] today.   Referrals  REFERRED TO PCP OFFICE   Disagree/Comply: Comply

## 2016-04-07 NOTE — Telephone Encounter (Signed)
FYI

## 2016-04-07 NOTE — Progress Notes (Signed)
Subjective:  Lisa [name] is a 55 y.o. year old very pleasant female patient who presents for/with See problem oriented charting ROS- had some hot flashes and chills this AM but improved now. No nausea, vomtiing, fever..see any ROS included in HPI as well.   Past Medical History-  Patient Active Problem List   Diagnosis Date Noted  . Abdominal pain 02/28/2016  . Dysuria 02/28/2016  . GERD (gastroesophageal reflux disease) 02/14/2016  . Morbid obesity ([place]) 02/14/2016  . Tobacco use disorder 08/12/2015  . Pulmonary nodule, right 04/11/2015  . COPD (chronic obstructive pulmonary disease) ([place]) 01/18/2015  . Mediastinal lymphadenopathy 01/18/2015  . DOE (dyspnea on exertion) 03/08/2014  . Abdominal pain, left lower quadrant 08/23/2013  . Migraine without aura 07/26/2013  . Incisional hernia 02/11/2012  . Sleep apnea 02/11/2012  . Chest discomfort 10/12/2011  . HTN (hypertension) 10/12/2011    Medications- reviewed and updated Current Outpatient Prescriptions  Medication Sig Dispense Refill  . buPROPion (WELLBUTRIN SR) 150 MG 12 hr tablet Take 1 tablet (150 mg total) by mouth 2 (two) times daily. 180 tablet 0  . carvedilol (COREG) 25 MG tablet Take 1 tablet (25 mg total) by mouth 2 (two) times daily. 180 tablet 3  . dicyclomine (BENTYL) 10 MG capsule Take 1 capsule (10 mg total) by mouth 3 (three) times daily as needed for spasms. 90 capsule 11  . losartan (COZAAR) 100 MG tablet Take 1 tablet (100 mg total) by mouth daily. 90 tablet 3  . omeprazole (PRILOSEC) 40 MG capsule Take 1 capsule (40 mg total) by mouth 2 (two) times daily. 180 capsule 1  . HYDROcodone-acetaminophen (NORCO/VICODIN) 5-325 MG per tablet Take 1-2 tablets by mouth every 4 (four) hours as needed. (Patient not taking: Reported on 04/07/2016) 20 tablet 0  . traMADol (ULTRAM) 50 MG tablet Take 1 tablet (50 mg total) by mouth every 8 (eight) hours as needed. (Patient not taking: Reported on 04/07/2016) 40 tablet 0   No  current facility-administered medications for this visit.    Objective: BP 130/80 mmHg  Pulse 82  Temp(Src) 98.2 F (36.8 C)  Wt 280 lb (127.007 kg)  SpO2 94% Gen: NAD, resting comfortably CV: RRR no murmurs rubs or gallops Lungs: CTAB no crackles, wheeze, rhonchi Abdomen: soft/nontender/nondistended/normal bowel sounds. No rebound or guarding.  Ext: no edema Skin: warm, dry Neuro: grossly normal, moves all extremities  Back - Normal skin, Spine with normal alignment and no deformity.  No tenderness to vertebral process palpation.  Paraspinous muscles are not tender and without spasm in lumbar spine- in thoracic spine just above CVA area she has an area over her ribs that is spasmed and painful to palpation.   Range of motion is full at neck and lumbar sacral regions. Negative Straight leg raise.  Neuro- no saddle anesthesia, 5/5 strength lower extremities, 2+ reflexes   Results for orders placed or performed in visit on 04/07/16 (from the past 24 hour(s))  POCT Urinalysis Dipstick (Automated)     Status: None   Collection Time: 04/07/16 11:24 AM  Result Value Ref Range   Color, UA yellow    Clarity, UA clear    Glucose, UA n    Bilirubin, UA n    Ketones, UA n    Spec Grav, UA 1.020    Blood, UA 2+    pH, UA 5.5    Protein, UA trace    Urobilinogen, UA 1.0    Nitrite, UA n    Leukocytes, UA  Negative Negative   Assessment/Plan:  Right flank pain - Plan: POCT Urinalysis Dipstick (Automated) S:Review of suprapubic pain.  02/28/16 took flagyl for 10 days, did not take levaquin- but urine culture did come back negative- no relief of symptoms. No vaginal discharge. Saw urology a week later and started on uribel- no relief with that. No scan or x-ray. Thought could be gynecological. Martin Majestic to GYN and saw NP and told she has adenomyosis. Sent for surgical consult with gynecologist Dr. Helane Rima- exam was done and told inflammation/irritation on right side of uterus. Told to take valium  and eventually would improve. Has been taking 2-3 tablets of motrin and that has improved without taking the valium. She is anxious abotu taking the valium.   Even though suprapubic pain has improved, she also has been dealing with right mid back pain- just above CVA area starting 2-3 weeks ago. Constant despite motrin taken for above issue. Pain up to 7/10 aching pain. Last night could not get comfortable - woke her up from sleep- would wake up sweating. Ice/heat have not helped. Nothing makes it better or worse- hurts all the time. She did have some hematuria but states she follows with urology and has had 2 cystoscopies for blood in urine which have been reassuring. Did not mention flank pain to urology recently.  A/P: Patient does have pain and spasm in her right mid back- advised trial of valium 1/2 tablet given by gyn. On other hand has flank pain and hematuria- concerning for stones. She would like to follow up with urology within a few days if not improving with the valium and declines CT or x-ray at this time. I do not think this is an internal issue but it is possible. We discussed follow up with PCP Terri Piedra within 2 weeks regardless. She states 7/10 pain but appears very comfortable on exam except with palpation. Also offered updated labs but she declines.    Strict Return precautions advised.   Orders Placed This Encounter  Procedures  . POCT Urinalysis Dipstick (Automated)   The duration of face-to-face time during this visit was 25 minutes. Greater than 50% of this time was spent in counseling, explanation of diagnosis, planning of further management, and/or coordination of care.    Garret Reddish, MD

## 2016-04-21 ENCOUNTER — Ambulatory Visit (INDEPENDENT_AMBULATORY_CARE_PROVIDER_SITE_OTHER): Payer: Managed Care, Other (non HMO) | Admitting: Family

## 2016-04-21 ENCOUNTER — Encounter: Payer: Self-pay | Admitting: Family

## 2016-04-21 VITALS — BP 128/84 | HR 77 | Temp 98.2°F | Resp 16 | Ht 66.0 in | Wt 284.0 lb

## 2016-04-21 DIAGNOSIS — R109 Unspecified abdominal pain: Secondary | ICD-10-CM | POA: Diagnosis not present

## 2016-04-21 NOTE — Patient Instructions (Signed)
Thank you for choosing [facility].  Summary/Instructions:  Please continue to take your medications as prescribed.   If your symptoms worsen, we will consider imaging to rule out a potential kidney stone.  If your symptoms worsen or fail to improve, please contact our office for further instruction, or in case of emergency go directly to the emergency room at the closest medical facility.   Cervical Strain and Sprain With Rehab Cervical strain and sprain are injuries that commonly occur with "whiplash" injuries. Whiplash occurs when the neck is forcefully whipped backward or forward, such as during a motor vehicle accident or during contact sports. The muscles, ligaments, tendons, discs, and nerves of the neck are susceptible to injury when this occurs. RISK FACTORS Risk of having a whiplash injury increases if:  Osteoarthritis of the spine.  Situations that make head or neck accidents or trauma more likely.  High-risk sports (football, rugby, wrestling, hockey, auto racing, gymnastics, diving, contact karate, or boxing).  Poor strength and flexibility of the neck.  Previous neck injury.  Poor tackling technique.  Improperly fitted or padded equipment. SYMPTOMS   Pain or stiffness in the front or back of neck or both.  Symptoms may present immediately or up to 24 hours after injury.  Dizziness, headache, nausea, and vomiting.  Muscle spasm with soreness and stiffness in the neck.  Tenderness and swelling at the injury site. PREVENTION  Learn and use proper technique (avoid tackling with the head, spearing, and head-butting; use proper falling techniques to avoid landing on the head).  Warm up and stretch properly before activity.  Maintain physical fitness:  Strength, flexibility, and endurance.  Cardiovascular fitness.  Wear properly fitted and padded protective equipment, such as padded soft collars, for participation in contact sports. PROGNOSIS    Recovery from cervical strain and sprain injuries is dependent on the extent of the injury. These injuries are usually curable in 1 week to 3 months with appropriate treatment.  RELATED COMPLICATIONS   Temporary numbness and weakness may occur if the nerve roots are damaged, and this may persist until the nerve has completely healed.  Chronic pain due to frequent recurrence of symptoms.  Prolonged healing, especially if activity is resumed too soon (before complete recovery). TREATMENT  Treatment initially involves the use of ice and medication to help reduce pain and inflammation. It is also important to perform strengthening and stretching exercises and modify activities that worsen symptoms so the injury does not get worse. These exercises may be performed at home or with a therapist. For patients who experience severe symptoms, a soft, padded collar may be recommended to be worn around the neck.  Improving your posture may help reduce symptoms. Posture improvement includes pulling your chin and abdomen in while sitting or standing. If you are sitting, sit in a firm chair with your buttocks against the back of the chair. While sleeping, try replacing your pillow with a small towel rolled to 2 inches in diameter, or use a cervical pillow or soft cervical collar. Poor sleeping positions delay healing.  For patients with nerve root damage, which causes numbness or weakness, the use of a cervical traction apparatus may be recommended. Surgery is rarely necessary for these injuries. However, cervical strain and sprains that are present at birth (congenital) may require surgery. MEDICATION   If pain medication is necessary, nonsteroidal anti-inflammatory medications, such as aspirin and ibuprofen, or other minor pain relievers, such as acetaminophen, are often recommended.  Do not take pain medication  for 7 days before surgery.  Prescription pain relievers may be given if deemed necessary by your  caregiver. Use only as directed and only as much as you need. HEAT AND COLD:   Cold treatment (icing) relieves pain and reduces inflammation. Cold treatment should be applied for 10 to 15 minutes every 2 to 3 hours for inflammation and pain and immediately after any activity that aggravates your symptoms. Use ice packs or an ice massage.  Heat treatment may be used prior to performing the stretching and strengthening activities prescribed by your caregiver, physical therapist, or athletic trainer. Use a heat pack or a warm soak. SEEK MEDICAL CARE IF:   Symptoms get worse or do not improve in 2 weeks despite treatment.  New, unexplained symptoms develop (drugs used in treatment may produce side effects). EXERCISES RANGE OF MOTION (ROM) AND STRETCHING EXERCISES - Cervical Strain and Sprain These exercises may help you when beginning to rehabilitate your injury. In order to successfully resolve your symptoms, you must improve your posture. These exercises are designed to help reduce the forward-head and rounded-shoulder posture which contributes to this condition. Your symptoms may resolve with or without further involvement from your physician, physical therapist or athletic trainer. While completing these exercises, remember:   Restoring tissue flexibility helps normal motion to return to the joints. This allows healthier, less painful movement and activity.  An effective stretch should be held for at least 20 seconds, although you may need to begin with shorter hold times for comfort.  A stretch should never be painful. You should only feel a gentle lengthening or release in the stretched tissue. STRETCH- Axial Extensors  Lie on your back on the floor. You may bend your knees for comfort. Place a rolled-up hand towel or dish towel, about 2 inches in diameter, under the part of your head that makes contact with the floor.  Gently tuck your chin, as if trying to make a "double chin," until you  feel a gentle stretch at the base of your head.  Hold __________ seconds. Repeat __________ times. Complete this exercise __________ times per day.  STRETCH - Axial Extension   Stand or sit on a firm surface. Assume a good posture: chest up, shoulders drawn back, abdominal muscles slightly tense, knees unlocked (if standing) and feet hip width apart.  Slowly retract your chin so your head slides back and your chin slightly lowers. Continue to look straight ahead.  You should feel a gentle stretch in the back of your head. Be certain not to feel an aggressive stretch since this can cause headaches later.  Hold for __________ seconds. Repeat __________ times. Complete this exercise __________ times per day. STRETCH - Cervical Side Bend   Stand or sit on a firm surface. Assume a good posture: chest up, shoulders drawn back, abdominal muscles slightly tense, knees unlocked (if standing) and feet hip width apart.  Without letting your nose or shoulders move, slowly tip your right / left ear to your shoulder until your feel a gentle stretch in the muscles on the opposite side of your neck.  Hold __________ seconds. Repeat __________ times. Complete this exercise __________ times per day. STRETCH - Cervical Rotators   Stand or sit on a firm surface. Assume a good posture: chest up, shoulders drawn back, abdominal muscles slightly tense, knees unlocked (if standing) and feet hip width apart.  Keeping your eyes level with the ground, slowly turn your head until you feel a gentle stretch along the  back and opposite side of your neck.  Hold __________ seconds. Repeat __________ times. Complete this exercise __________ times per day. RANGE OF MOTION - Neck Circles   Stand or sit on a firm surface. Assume a good posture: chest up, shoulders drawn back, abdominal muscles slightly tense, knees unlocked (if standing) and feet hip width apart.  Gently roll your head down and around from the back of  one shoulder to the back of the other. The motion should never be forced or painful.  Repeat the motion 10-20 times, or until you feel the neck muscles relax and loosen. Repeat __________ times. Complete the exercise __________ times per day. STRENGTHENING EXERCISES - Cervical Strain and Sprain These exercises may help you when beginning to rehabilitate your injury. They may resolve your symptoms with or without further involvement from your physician, physical therapist, or athletic trainer. While completing these exercises, remember:   Muscles can gain both the endurance and the strength needed for everyday activities through controlled exercises.  Complete these exercises as instructed by your physician, physical therapist, or athletic trainer. Progress the resistance and repetitions only as guided.  You may experience muscle soreness or fatigue, but the pain or discomfort you are trying to eliminate should never worsen during these exercises. If this pain does worsen, stop and make certain you are following the directions exactly. If the pain is still present after adjustments, discontinue the exercise until you can discuss the trouble with your clinician. STRENGTH - Cervical Flexors, Isometric  Face a wall, standing about 6 inches away. Place a small pillow, a ball about 6-8 inches in diameter, or a folded towel between your forehead and the wall.  Slightly tuck your chin and gently push your forehead into the soft object. Push only with mild to moderate intensity, building up tension gradually. Keep your jaw and forehead relaxed.  Hold 10 to 20 seconds. Keep your breathing relaxed.  Release the tension slowly. Relax your neck muscles completely before you start the next repetition. Repeat __________ times. Complete this exercise __________ times per day. STRENGTH- Cervical Lateral Flexors, Isometric   Stand about 6 inches away from a wall. Place a small pillow, a ball about 6-8 inches  in diameter, or a folded towel between the side of your head and the wall.  Slightly tuck your chin and gently tilt your head into the soft object. Push only with mild to moderate intensity, building up tension gradually. Keep your jaw and forehead relaxed.  Hold 10 to 20 seconds. Keep your breathing relaxed.  Release the tension slowly. Relax your neck muscles completely before you start the next repetition. Repeat __________ times. Complete this exercise __________ times per day. STRENGTH - Cervical Extensors, Isometric   Stand about 6 inches away from a wall. Place a small pillow, a ball about 6-8 inches in diameter, or a folded towel between the back of your head and the wall.  Slightly tuck your chin and gently tilt your head back into the soft object. Push only with mild to moderate intensity, building up tension gradually. Keep your jaw and forehead relaxed.  Hold 10 to 20 seconds. Keep your breathing relaxed.  Release the tension slowly. Relax your neck muscles completely before you start the next repetition. Repeat __________ times. Complete this exercise __________ times per day. POSTURE AND BODY MECHANICS CONSIDERATIONS - Cervical Strain and Sprain Keeping correct posture when sitting, standing or completing your activities will reduce the stress put on different body tissues, allowing injured  tissues a chance to heal and limiting painful experiences. The following are general guidelines for improved posture. Your physician or physical therapist will provide you with any instructions specific to your needs. While reading these guidelines, remember:  The exercises prescribed by your provider will help you have the flexibility and strength to maintain correct postures.  The correct posture provides the optimal environment for your joints to work. All of your joints have less wear and tear when properly supported by a spine with good posture. This means you will experience a healthier,  less painful body.  Correct posture must be practiced with all of your activities, especially prolonged sitting and standing. Correct posture is as important when doing repetitive low-stress activities (typing) as it is when doing a single heavy-load activity (lifting). PROLONGED STANDING WHILE SLIGHTLY LEANING FORWARD When completing a task that requires you to lean forward while standing in one place for a long time, place either foot up on a stationary 2- to 4-inch high object to help maintain the best posture. When both feet are on the ground, the low back tends to lose its slight inward curve. If this curve flattens (or becomes too large), then the back and your other joints will experience too much stress, fatigue more quickly, and can cause pain.  RESTING POSITIONS Consider which positions are most painful for you when choosing a resting position. If you have pain with flexion-based activities (sitting, bending, stooping, squatting), choose a position that allows you to rest in a less flexed posture. You would want to avoid curling into a fetal position on your side. If your pain worsens with extension-based activities (prolonged standing, working overhead), avoid resting in an extended position such as sleeping on your stomach. Most people will find more comfort when they rest with their spine in a more neutral position, neither too rounded nor too arched. Lying on a non-sagging bed on your side with a pillow between your knees, or on your back with a pillow under your knees will often provide some relief. Keep in mind, being in any one position for a prolonged period of time, no matter how correct your posture, can still lead to stiffness. WALKING Walk with an upright posture. Your ears, shoulders, and hips should all line up. OFFICE WORK When working at a desk, create an environment that supports good, upright posture. Without extra support, muscles fatigue and lead to excessive strain on joints  and other tissues. CHAIR:  A chair should be able to slide under your desk when your back makes contact with the back of the chair. This allows you to work closely.  The chair's height should allow your eyes to be level with the upper part of your monitor and your hands to be slightly lower than your elbows.  Body position:  Your feet should make contact with the floor. If this is not possible, use a foot rest.  Keep your ears over your shoulders. This will reduce stress on your neck and low back.   This information is not intended to replace advice given to you by your health care provider. Make sure you discuss any questions you have with your health care provider.   Document Released: 11/16/2005 Document Revised: 12/07/2014 Document Reviewed: 02/28/2009 [employer] Interactive Patient Education [employer].

## 2016-04-21 NOTE — Progress Notes (Signed)
Subjective:    Patient ID: Lisa [name], female    DOB: 1961-07-14, 54 y.o.   MRN: [id number]  Chief Complaint  Patient presents with  . Follow-up    states that flank pain is much better    HPI:  Lisa [name] is a 55 y.o. female who  has a past medical history of Hypertension; Chest pain, non-cardiac; Obesity; Cigarette smoker; IBS (irritable bowel syndrome); GERD (gastroesophageal reflux disease); Diverticulosis; Pyloric erosion; Allergic rhinitis; Sleep apnea; Chronic headaches; Breast nodule; Complication of anesthesia (05-17-2012); Hepatic hemangioma; and Asthma. and presents today For a follow-up office exam.  Recently evaluated in the office with the chief complaint of right flank pain, hot flashes and chills. She has recently been seen in the office for suprapubic pain and treated with metronidazole 10 days. She has also been seen by urology with no relief from the medication prescribed and no scans or x-rays done at that time. Gynecology follow-up with concern for adenomyosis. She was sent for surgical consult with Dr. Helane Rima with exam being done with inflammation on the right side of her uterus. She was instructed to continue the valium that was previously prescribed and the pain would go away. The pain was much improved but continued to experience right mid-back pain which was believed to be from spasm. There was also concern for kidney stones declining a CT scan or X-ray.   Reports today for follow up with improvement in her flank pain and denies any fever, dysuria, urinary frequency or urgency. Reports taking the Valium as prescribed and denies adverse side effects with improvement in her pain. She continues to experience occasional abdominal cramping which is generally well controlled with the dicyclomine.   Allergies  Allergen Reactions  . Hctz [Hydrochlorothiazide]     Causes a headache  . Lisinopril Cough    Cough   . Morphine And Related     Moodiness.  Causes  anger.  . Other     guaifensen             Restlessness, jittery  . Percocet [Oxycodone-Acetaminophen] Other (See Comments)     Current Outpatient Prescriptions on File Prior to Visit  Medication Sig Dispense Refill  . buPROPion (WELLBUTRIN SR) 150 MG 12 hr tablet Take 1 tablet (150 mg total) by mouth 2 (two) times daily. 180 tablet 0  . carvedilol (COREG) 25 MG tablet Take 1 tablet (25 mg total) by mouth 2 (two) times daily. 180 tablet 3  . dicyclomine (BENTYL) 10 MG capsule Take 1 capsule (10 mg total) by mouth 3 (three) times daily as needed for spasms. 90 capsule 11  . HYDROcodone-acetaminophen (NORCO/VICODIN) 5-325 MG per tablet Take 1-2 tablets by mouth every 4 (four) hours as needed. 20 tablet 0  . losartan (COZAAR) 100 MG tablet Take 1 tablet (100 mg total) by mouth daily. 90 tablet 3  . omeprazole (PRILOSEC) 40 MG capsule Take 1 capsule (40 mg total) by mouth 2 (two) times daily. 180 capsule 1   No current facility-administered medications on file prior to visit.    Review of Systems  Constitutional: Negative for fever and chills.  Respiratory: Negative for chest tightness and shortness of breath.   Gastrointestinal: Negative for nausea, vomiting, abdominal pain, diarrhea, constipation and blood in stool.  Genitourinary: Negative for dysuria, urgency, frequency, hematuria, flank pain and difficulty urinating.      Objective:    BP 128/84 mmHg  Pulse 77  Temp(Src) 98.2 F (36.8 C) (Oral)  Resp 16  Ht 5\' 6"  (1.676 m)  Wt 284 lb (128.822 kg)  BMI 45.86 kg/m2  SpO2 98% Nursing note and vital signs reviewed.  Physical Exam  Constitutional: She is oriented to person, place, and time. She appears well-developed and well-nourished. No distress.  Cardiovascular: Normal rate, regular rhythm, normal heart sounds and intact distal pulses.   Pulmonary/Chest: Effort normal and breath sounds normal.  Abdominal: She exhibits no distension and no mass. There is no tenderness. There  is no rebound, no guarding and no CVA tenderness.  Neurological: She is alert and oriented to person, place, and time.  Skin: Skin is warm and dry.  Psychiatric: She has a normal mood and affect. Her behavior is normal. Judgment and thought content normal.       Assessment & Plan:   Problem List Items Addressed This Visit      Other   Flank pain - Primary    Flank pain appears resolved with treatment of Valium. Notes no further hematuria per her recent urology visit. Does continue to have some abdominal spasms controlled with dicyclomine. Concern remains for potential kidney stone but unlikely. Declines ultrasound and CT scan today. Will continue to monitor for symptom worsening.           I have discontinued Ms. Mertens's traMADol. I am also having her maintain her HYDROcodone-acetaminophen, carvedilol, losartan, buPROPion, dicyclomine, omeprazole, and diazepam.   Meds ordered this encounter  Medications  . diazepam (VALIUM) 5 MG tablet    Sig: Take 5 mg by mouth every 6 (six) hours as needed for anxiety.     Follow-up: Return if symptoms worsen or fail to improve.  Mauricio Po, FNP

## 2016-04-21 NOTE — Assessment & Plan Note (Signed)
Flank pain appears resolved with treatment of Valium. Notes no further hematuria per her recent urology visit. Does continue to have some abdominal spasms controlled with dicyclomine. Concern remains for potential kidney stone but unlikely. Declines ultrasound and CT scan today. Will continue to monitor for symptom worsening.

## 2016-04-22 ENCOUNTER — Other Ambulatory Visit: Payer: Self-pay | Admitting: Emergency Medicine

## 2016-05-15 ENCOUNTER — Encounter: Payer: Self-pay | Admitting: Family

## 2016-05-15 ENCOUNTER — Ambulatory Visit (INDEPENDENT_AMBULATORY_CARE_PROVIDER_SITE_OTHER): Payer: Managed Care, Other (non HMO) | Admitting: Family

## 2016-05-15 VITALS — BP 138/94 | HR 78 | Temp 97.9°F | Resp 16 | Ht 66.0 in

## 2016-05-15 DIAGNOSIS — M25562 Pain in left knee: Secondary | ICD-10-CM | POA: Diagnosis not present

## 2016-05-15 NOTE — Assessment & Plan Note (Signed)
Mild/moderate left knee effusion with no significant trauma. Knee aspirated with cortisone injection being placed. Continue with conservative treatment of ice and home exercise. Encouraged weight loss to reduce pressure on knees. Follow up if symptoms do not improve.

## 2016-05-15 NOTE — Progress Notes (Signed)
Subjective:    Patient ID: Lisa [name], female    DOB: [birth year], 55 y.o.   MRN: [id number]  Chief Complaint  Patient presents with  . Knee Pain    left knee pain, swollen and puffy, feels like someone is squeezing her knee    HPI:  Lisa [name] is a 55 y.o. female who  has a past medical history of Hypertension; Chest pain, non-cardiac; Obesity; Cigarette smoker; IBS (irritable bowel syndrome); GERD (gastroesophageal reflux disease); Diverticulosis; Pyloric erosion; Allergic rhinitis; Sleep apnea; Chronic headaches; Breast nodule; Complication of anesthesia (05-17-2012); Hepatic hemangioma; and Asthma. and presents today for an acute office visit.   This is a new problem. Associated symptom of pain located in her left knee has been going on for a couple of weeks. Describes her knee as swollen and puffy with a pain that is described as feeling like something is squeezing her knee . Denies trauma. Believes it may be related to age. Does have some popping without catching or giving way. Modifying factors include Motrin which did help a little. Endorses some decrease in range of motion.   Allergies  Allergen Reactions  . Hctz [Hydrochlorothiazide]     Causes a headache  . Lisinopril Cough    Cough   . Morphine And Related     Moodiness.  Causes anger.  . Other     guaifensen             Restlessness, jittery  . Percocet [Oxycodone-Acetaminophen] Other (See Comments)     Current Outpatient Prescriptions on File Prior to Visit  Medication Sig Dispense Refill  . buPROPion (WELLBUTRIN SR) 150 MG 12 hr tablet TAKE 1 TABLET (150 MG TOTAL) BY MOUTH 2 (TWO) TIMES DAILY. 180 tablet 0  . carvedilol (COREG) 25 MG tablet Take 1 tablet (25 mg total) by mouth 2 (two) times daily. 180 tablet 3  . diazepam (VALIUM) 5 MG tablet Take 5 mg by mouth every 6 (six) hours as needed for anxiety.    . dicyclomine (BENTYL) 10 MG capsule Take 1 capsule (10 mg total) by mouth 3 (three) times daily as  needed for spasms. 90 capsule 11  . HYDROcodone-acetaminophen (NORCO/VICODIN) 5-325 MG per tablet Take 1-2 tablets by mouth every 4 (four) hours as needed. 20 tablet 0  . losartan (COZAAR) 100 MG tablet Take 1 tablet (100 mg total) by mouth daily. 90 tablet 3  . omeprazole (PRILOSEC) 40 MG capsule Take 1 capsule (40 mg total) by mouth 2 (two) times daily. 180 capsule 1   No current facility-administered medications on file prior to visit.     Past Surgical History  Procedure Laterality Date  . Cesarean section  2003, 2009    x 2  . Laparoscopy    . Umbilical hernia repair  2004  . US echocardiography  05/08/2008    EF 55-60%  . Cardiovascular stress test  03/25/2007    EF 78%  . Endometrial ablation  09/2010    and D&C  . Cardiac catheterization  12/28/2007    EF 75%. IT REVEALS NORMAL/SUPRANORMAL LEFT VENTRICULAR SYSTOLIC FUNCTION  . Cholecystectomy  1993  . Hernia repair  123XX123    umbilical  . Ventral hernia repair  06/15/2012    Procedure: LAPAROSCOPIC VENTRAL HERNIA;  Surgeon: Edward Jolly, MD;  Location: [facility];  Service: General;  Laterality: N/A;  LAPAROSCOPIC REPAIR VENTRAL HERNIA  . Colonoscopy  2014  . Tubal ligation      Past  Medical History  Diagnosis Date  . Hypertension   . Chest pain, non-cardiac   . Obesity   . Cigarette smoker   . IBS (irritable bowel syndrome)   . GERD (gastroesophageal reflux disease)   . Diverticulosis   . Pyloric erosion   . Allergic rhinitis   . Sleep apnea     CPAP setting varies from 4-10  . Chronic headaches   . Breast nodule     right breast, to see dr Helane Rima 06-20-2012 for   . Complication of anesthesia 05-17-2012    trouble breathing after colonscopy, needed nebulizer  . Hepatic hemangioma   . Asthma      Review of Systems  Constitutional: Negative for fever and chills.  Musculoskeletal: Positive for joint swelling.       Positive for left knee pain  Neurological: Negative for weakness and numbness.        Objective:    BP 138/94 mmHg  Pulse 78  Temp(Src) 97.9 F (36.6 C) (Oral)  Resp 16  Ht 5\' 6"  (1.676 m)  Wt   SpO2 96% Nursing note and vital signs reviewed.  Physical Exam  Constitutional: She is oriented to person, place, and time. She appears well-developed and well-nourished. No distress.  Cardiovascular: Normal rate, regular rhythm, normal heart sounds and intact distal pulses.   Pulmonary/Chest: Effort normal and breath sounds normal.  Musculoskeletal:  Left knee - Mild/moderate edema with no deformity or discoloration. Mild tenderness located around the patellofemoral area. Range of motion is slightly restricted in full flexion. Strength is normal. Ligamentous and meniscal testing are negative.   Neurological: She is alert and oriented to person, place, and time.  Skin: Skin is warm and dry.  Psychiatric: She has a normal mood and affect. Her behavior is normal. Judgment and thought content normal.    Examination: Ultrasound of knee Date:  05/15/2016 Patient Name: Lisa [name] History:  Findings:  The extensor mechanism, including the quadriceps tendon, patella, and patellar tendon is normal without bursal abnormalities.Mild/moderate joint effuction with no synovial hypertrophy. The medial collateral and lateral collateral ligaments are normal. Unremarkable iliotibial tract, biceps femoris, popliteus tendon, and common peroneal nerve. No Baker cyst. Limited evaluation of the menisci is unremarkable.  Impression:  Mild knee effusion with no other significant pathology.        All images are located under the media tab. [name] ordered, performed and interpreted by Terri Piedra, FNP  Procedure: Left knee injection / aspiration  Description: Informed consent was obtained with discussion including risks and benefits of the procedure. Patient verbally wished to continued. Time out was performed. The site was marked and identified. It was cleansed with betadine using a concentric  circular pattern. Skin anesthesia was applied using cold spray applied for 10 seconds. Joint was anesthestized with Sensorcaine. Joint aspiration removed 2 ml of clear synovial fluid. Injection of 1 ml of the Kenalog was injected following aspiration with no return noted. Following the injection there was instant relief confirming appropriate placement. A bandage was applied to the area and post care instructions were provided. The procedure was tolerated well with no complications.      Assessment & Plan:   Problem List Items Addressed This Visit      Other   Left knee pain - Primary    Mild/moderate left knee effusion with no significant trauma. Knee aspirated with cortisone injection being placed. Continue with conservative treatment of ice and home exercise. Encouraged weight loss to reduce pressure on  knees. Follow up if symptoms do not improve.           I am having Ms. Goldmann maintain her HYDROcodone-acetaminophen, carvedilol, losartan, dicyclomine, omeprazole, diazepam, and buPROPion.   No orders of the defined types were placed in this encounter.     Follow-up: No Follow-up on file.  Mauricio Po, FNP

## 2016-05-15 NOTE — Patient Instructions (Signed)
Thank you for choosing [facility].  Summary/Instructions:  Rest for 24 hours  Ice 2-3 times per day and after activity  Soreness may occur for the next 24-48 hours.  Exercises and stretches daily.  Follow up for signs of infection.   If your symptoms worsen or fail to improve, please contact our office for further instruction, or in case of emergency go directly to the emergency room at the closest medical facility.

## 2016-05-26 ENCOUNTER — Encounter: Payer: Self-pay | Admitting: Family

## 2016-05-26 ENCOUNTER — Other Ambulatory Visit (INDEPENDENT_AMBULATORY_CARE_PROVIDER_SITE_OTHER): Payer: Managed Care, Other (non HMO)

## 2016-05-26 ENCOUNTER — Ambulatory Visit (INDEPENDENT_AMBULATORY_CARE_PROVIDER_SITE_OTHER): Payer: Managed Care, Other (non HMO) | Admitting: Family

## 2016-05-26 VITALS — BP 138/82 | HR 74 | Temp 97.9°F | Resp 18 | Ht 66.0 in | Wt 283.0 lb

## 2016-05-26 DIAGNOSIS — Z0000 Encounter for general adult medical examination without abnormal findings: Secondary | ICD-10-CM

## 2016-05-26 LAB — CBC
HEMATOCRIT: 45.5 % (ref 36.0–46.0)
HEMOGLOBIN: 15.3 g/dL — AB (ref 12.0–15.0)
MCHC: 33.7 g/dL (ref 30.0–36.0)
MCV: 87.3 fl (ref 78.0–100.0)
PLATELETS: 309 10*3/uL (ref 150.0–400.0)
RBC: 5.22 Mil/uL — AB (ref 3.87–5.11)
RDW: 14.8 % (ref 11.5–15.5)
WBC: 8.4 10*3/uL (ref 4.0–10.5)

## 2016-05-26 LAB — COMPREHENSIVE METABOLIC PANEL
ALT: 23 U/L (ref 0–35)
AST: 13 U/L (ref 0–37)
Albumin: 4.1 g/dL (ref 3.5–5.2)
Alkaline Phosphatase: 97 U/L (ref 39–117)
BILIRUBIN TOTAL: 0.5 mg/dL (ref 0.2–1.2)
BUN: 15 mg/dL (ref 6–23)
CO2: 31 meq/L (ref 19–32)
Calcium: 9.9 mg/dL (ref 8.4–10.5)
Chloride: 102 mEq/L (ref 96–112)
Creatinine, Ser: 0.99 mg/dL (ref 0.40–1.20)
GFR: 75.55 mL/min (ref >60.00)
GLUCOSE: 126 mg/dL — AB (ref 70–99)
Potassium: 3.9 mEq/L (ref 3.5–5.1)
SODIUM: 137 meq/L (ref 135–145)
Total Protein: 8 g/dL (ref 6.0–8.3)

## 2016-05-26 LAB — LIPID PANEL
CHOL/HDL RATIO: 6
Cholesterol: 215 mg/dL — ABNORMAL HIGH (ref 0–200)
HDL: 34.5 mg/dL — ABNORMAL LOW (ref >39.00)
LDL CALC: 155 mg/dL — AB (ref 0–99)
NONHDL: 180.96
TRIGLYCERIDES: 130 mg/dL (ref 0.0–149.0)
VLDL: 26 mg/dL (ref 0.0–40.0)

## 2016-05-26 LAB — VITAMIN D 25 HYDROXY (VIT D DEFICIENCY, FRACTURES): VITD: 29.22 ng/mL — ABNORMAL LOW (ref 30.00–100.00)

## 2016-05-26 MED ORDER — LOSARTAN POTASSIUM 100 MG PO TABS: 100.0000 mg | ORAL_TABLET | Freq: Every day | ORAL | Status: DC

## 2016-05-26 MED ORDER — CARVEDILOL 25 MG PO TABS: 25.0000 mg | ORAL_TABLET | Freq: Two times a day (BID) | ORAL | Status: DC

## 2016-05-26 NOTE — Progress Notes (Signed)
Subjective:    Patient ID: Lisa [name], female    DOB: 1961/05/07, 55 y.o.   MRN: [id number]  Chief Complaint  Patient presents with  . CPE    fasting, having pain everywhere    HPI:  Lisa [name] is a 55 y.o. female who presents today for an annual wellness visit.   1) Health Maintenance -   Diet - Averages about 1-2 meals consisting of vegetables, chicken, beef, pork, fish; 1 cup of caffeine on occasion.   Exercise - Limited exercise lately; Has been walking   2) Preventative Exams / Immunizations:  Dental -- Due for exam  Vision -- Up to date    Health Maintenance  Topic Date Due  . Hepatitis C Screening  [birth year]  . HIV Screening  09/12/1978  . INFLUENZA VACCINE  06/30/2016  . PAP SMEAR  10/16/2017  . MAMMOGRAM  12/08/2017  . TETANUS/TDAP  01/28/2022  . COLONOSCOPY  05/17/2022    Immunization History  Administered Date(s) Administered  . Influenza Split 12/31/2013, 08/30/2014  . Influenza,inj,Quad PF,36+ Mos 08/12/2015  . Pneumococcal Polysaccharide-23 06/16/2012    Allergies  Allergen Reactions  . Hctz [Hydrochlorothiazide]     Causes a headache  . Lisinopril Cough    Cough   . Morphine And Related     Moodiness.  Causes anger.  . Other     guaifensen             Restlessness, jittery  . Percocet [Oxycodone-Acetaminophen] Other (See Comments)     Outpatient Prescriptions Prior to Visit  Medication Sig Dispense Refill  . buPROPion (WELLBUTRIN SR) 150 MG 12 hr tablet TAKE 1 TABLET (150 MG TOTAL) BY MOUTH 2 (TWO) TIMES DAILY. 180 tablet 0  . diazepam (VALIUM) 5 MG tablet Take 5 mg by mouth every 6 (six) hours as needed for anxiety.    . dicyclomine (BENTYL) 10 MG capsule Take 1 capsule (10 mg total) by mouth 3 (three) times daily as needed for spasms. 90 capsule 11  . omeprazole (PRILOSEC) 40 MG capsule Take 1 capsule (40 mg total) by mouth 2 (two) times daily. 180 capsule 1  . carvedilol (COREG) 25 MG tablet Take 1 tablet (25 mg  total) by mouth 2 (two) times daily. 180 tablet 3  . losartan (COZAAR) 100 MG tablet Take 1 tablet (100 mg total) by mouth daily. 90 tablet 3  . HYDROcodone-acetaminophen (NORCO/VICODIN) 5-325 MG per tablet Take 1-2 tablets by mouth every 4 (four) hours as needed. 20 tablet 0   No facility-administered medications prior to visit.     Past Medical History  Diagnosis Date  . Hypertension   . Chest pain, non-cardiac   . Obesity   . Cigarette smoker   . IBS (irritable bowel syndrome)   . GERD (gastroesophageal reflux disease)   . Diverticulosis   . Pyloric erosion   . Allergic rhinitis   . Sleep apnea     CPAP setting varies from 4-10  . Chronic headaches   . Breast nodule     right breast, to see dr Helane Rima 06-20-2012 for   . Complication of anesthesia 05-17-2012    trouble breathing after colonscopy, needed nebulizer  . Hepatic hemangioma   . Asthma      Past Surgical History  Procedure Laterality Date  . Cesarean section  2003, 2009    x 2  . Laparoscopy    . Umbilical hernia repair  2004  . US echocardiography  05/08/2008  EF 55-60%  . Cardiovascular stress test  03/25/2007    EF 78%  . Endometrial ablation  09/2010    and D&C  . Cardiac catheterization  12/28/2007    EF 75%. IT REVEALS NORMAL/SUPRANORMAL LEFT VENTRICULAR SYSTOLIC FUNCTION  . Cholecystectomy  1993  . Hernia repair  [id number]    umbilical  . Ventral hernia repair  06/15/2012    Procedure: LAPAROSCOPIC VENTRAL HERNIA;  Surgeon: Edward Jolly, MD;  Location: [facility];  Service: General;  Laterality: N/A;  LAPAROSCOPIC REPAIR VENTRAL HERNIA  . Colonoscopy  2014  . Tubal ligation       Family History  Problem Relation Age of Onset  . Hypertension Mother   . Lung cancer Mother   . Diabetes Maternal Grandmother   . Colon cancer Neg Hx   . Heart disease Father   . Heart attack Paternal Uncle   . Breast cancer       Social History   Social History  . Marital Status: Married    Spouse Name:  [name]  . Number of Children: 2  . Years of Education: 14+   Occupational History  . 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 Smoking status: Current Every Day Smoker -- 1.50 packs/day for 25 years    Types: Cigarettes  . Smokeless tobacco: Never Used     Comment: form given 10/19/14, 01-29-16  . Alcohol Use: 0.0 oz/week    0 Standard drinks or equivalent per week     Comment: very rare  . Drug Use: No  . Sexual Activity: Not Currently   Other Topics Concern  . Not on file   Social History Narrative   Patient lives at her home with her family.   Caffeine Use: 1 soda occasionally      Review of Systems  Constitutional: Denies fever, chills, fatigue, or significant weight gain/loss. HENT: Head: Denies headache or neck pain Ears: Denies changes in hearing, ringing in ears, earache, drainage Nose: Denies discharge, stuffiness, itching, nosebleed, sinus pain Throat: Denies sore throat, hoarseness, dry mouth, sores, thrush Eyes: Denies loss/changes in vision, pain, redness, blurry/double vision, flashing lights Cardiovascular: Denies chest pain/discomfort, tightness, palpitations, shortness of breath with activity, difficulty lying down, swelling, sudden awakening with shortness of breath Respiratory: Denies shortness of breath, cough, sputum production, wheezing Gastrointestinal: Denies dysphasia, heartburn, change in appetite, nausea, change in bowel habits, rectal bleeding, constipation, diarrhea, yellow skin or eyes Genitourinary: Denies frequency, urgency, burning/pain, blood in urine, incontinence, change in urinary strength. Musculoskeletal: Denies muscle/joint pain, stiffness, back pain, redness or swelling of joints, trauma Skin: Denies rashes, lumps, itching, dryness, color changes, or hair/nail changes Neurological: Denies dizziness, fainting, seizures, weakness, numbness, tingling, tremor Psychiatric - Denies nervousness, stress, depression or  memory loss Endocrine: Denies heat or cold intolerance, sweating, frequent urination, excessive thirst, changes in appetite Hematologic: Denies ease of bruising or bleeding     Objective:     BP 138/82 mmHg  Pulse 74  Temp(Src) 97.9 F (36.6 C) (Oral)  Resp 18  Ht 5\' 6"  (1.676 m)  Wt 283 lb (128.368 kg)  BMI 45.70 kg/m2  SpO2 98% Nursing note and vital signs reviewed.  Physical Exam  Constitutional: She is oriented to person, place, and time. She appears well-developed and well-nourished.  HENT:  Head: Normocephalic.  Right Ear: Hearing, tympanic membrane, external ear and ear canal normal.  Left Ear: Hearing, tympanic membrane, external ear and ear canal  normal.  Nose: Nose normal.  Mouth/Throat: Uvula is midline, oropharynx is clear and moist and mucous membranes are normal.  Eyes: Conjunctivae and EOM are normal. Pupils are equal, round, and reactive to light.  Neck: Neck supple. No JVD present. No tracheal deviation present. No thyromegaly present.  Cardiovascular: Normal rate, regular rhythm, normal heart sounds and intact distal pulses.   Pulmonary/Chest: Effort normal and breath sounds normal.  Abdominal: Soft. Bowel sounds are normal. She exhibits no distension and no mass. There is no tenderness. There is no rebound and no guarding.  Musculoskeletal: Normal range of motion. She exhibits no edema or tenderness.  Lymphadenopathy:    She has no cervical adenopathy.  Neurological: She is alert and oriented to person, place, and time. She has normal reflexes. No cranial nerve deficit. She exhibits normal muscle tone. Coordination normal.  Skin: Skin is warm and dry.  Psychiatric: She has a normal mood and affect. Her behavior is normal. Judgment and thought content normal.       Assessment & Plan:   Problem List Items Addressed This Visit      Other   Routine general medical examination at a health care facility - Primary    1) Anticipatory Guidance: Discussed  importance of wearing a seatbelt while driving and not texting while driving; changing batteries in smoke detector at least once annually; wearing suntan lotion when outside; eating a balanced and moderate diet; getting physical activity at least 30 minutes per day.  2) Immunizations / Screenings / Labs:  All immunizations are up-to-date per recommendations. Due for a dental exam encouraged to be completed independently. Obtain hepatitis C antibody for hepatitis C screening. All other screenings are up-to-date per recommendations. Obtain CBC, CMET, Lipid profile and vitamin D.   Overall well exam with risk factors for cardiovascular disease including obesity, tobacco use, and hypertension. Blood pressure appears adequately controlled with current regimen and no adverse side effects. Continues to smoke a significant amount of cigarettes on a regular basis with concern for greater than 40-pack-year history. Not ready to quit smoking at this time. Discussed and educated regarding continued tobacco use and associated risks for cardiovascular, respiratory, and malignant diseases. Recommend weight loss of approximately 5-10% of current body weight through nutrition and physical activity. BMI is currently 45.Recommend increasing physical activity to 30 minutes of moderate level activity daily. Encourage nutritional intake that focuses on nutrient dense foods and is moderate, varied, and balanced and is low in saturated fats and processed/sugary foods. Continue other healthy lifestyle behaviors and choices. Follow-up prevention exam in 1 year. Follow-up for chronic conditions pending blood work.       Relevant Orders   Hepatitis C antibody   Comprehensive metabolic panel   CBC   Lipid panel   VITAMIN D 25 Hydroxy (Vit-D Deficiency, Fractures)       I have discontinued Ms. Sikkema's HYDROcodone-acetaminophen. I am also having her maintain her dicyclomine, omeprazole, diazepam, buPROPion, carvedilol, and  losartan.   Meds ordered this encounter  Medications  . carvedilol (COREG) 25 MG tablet    Sig: Take 1 tablet (25 mg total) by mouth 2 (two) times daily.    Dispense:  180 tablet    Refill:  3  . losartan (COZAAR) 100 MG tablet    Sig: Take 1 tablet (100 mg total) by mouth daily.    Dispense:  90 tablet    Refill:  3     Follow-up: Return if symptoms worsen or fail to improve.  Mauricio Po, FNP

## 2016-05-26 NOTE — Progress Notes (Signed)
Pre visit review using our clinic review tool, if applicable. No additional management support is needed unless otherwise documented below in the visit note. 

## 2016-05-26 NOTE — Assessment & Plan Note (Signed)
1) Anticipatory Guidance: Discussed importance of wearing a seatbelt while driving and not texting while driving; changing batteries in smoke detector at least once annually; wearing suntan lotion when outside; eating a balanced and moderate diet; getting physical activity at least 30 minutes per day.  2) Immunizations / Screenings / Labs:  All immunizations are up-to-date per recommendations. Due for a dental exam encouraged to be completed independently. Obtain hepatitis C antibody for hepatitis C screening. All other screenings are up-to-date per recommendations. Obtain CBC, CMET, Lipid profile and vitamin D.   Overall well exam with risk factors for cardiovascular disease including obesity, tobacco use, and hypertension. Blood pressure appears adequately controlled with current regimen and no adverse side effects. Continues to smoke a significant amount of cigarettes on a regular basis with concern for greater than 40-pack-year history. Not ready to quit smoking at this time. Discussed and educated regarding continued tobacco use and associated risks for cardiovascular, respiratory, and malignant diseases. Recommend weight loss of approximately 5-10% of current body weight through nutrition and physical activity. BMI is currently 45.Recommend increasing physical activity to 30 minutes of moderate level activity daily. Encourage nutritional intake that focuses on nutrient dense foods and is moderate, varied, and balanced and is low in saturated fats and processed/sugary foods. Continue other healthy lifestyle behaviors and choices. Follow-up prevention exam in 1 year. Follow-up for chronic conditions pending blood work.

## 2016-05-26 NOTE — Patient Instructions (Addendum)
Thank you for choosing [facility].  Summary/Instructions:  Please continue to take your medications.   Start with MyFitnessPal to track your calories.   If your symptoms worsen or fail to improve, please contact our office for further instruction, or in case of emergency go directly to the emergency room at the closest medical facility.   Health Maintenance, Female Adopting a healthy lifestyle and getting preventive care can go a long way to promote health and wellness. Talk with your health care provider about what schedule of regular examinations is right for you. This is a good chance for you to check in with your provider about disease prevention and staying healthy. In between checkups, there are plenty of things you can do on your own. Experts have done a lot of research about which lifestyle changes and preventive measures are most likely to keep you healthy. Ask your health care provider for more information. WEIGHT AND DIET  Eat a healthy diet  Be sure to include plenty of vegetables, fruits, low-fat dairy products, and lean protein.  Do not eat a lot of foods high in solid fats, added sugars, or salt.  Get regular exercise. This is one of the most important things you can do for your health.  Most adults should exercise for at least 150 minutes each week. The exercise should increase your heart rate and make you sweat (moderate-intensity exercise).  Most adults should also do strengthening exercises at least twice a week. This is in addition to the moderate-intensity exercise.  Maintain a healthy weight  Body mass index (BMI) is a measurement that can be used to identify possible weight problems. It estimates body fat based on height and weight. Your health care provider can help determine your BMI and help you achieve or maintain a healthy weight.  For females 63 years of age and older:   A BMI below 18.5 is considered underweight.  A BMI of 18.5 to 24.9 is  normal.  A BMI of 25 to 29.9 is considered overweight.  A BMI of 30 and above is considered obese.  Watch levels of cholesterol and blood lipids  You should start having your blood tested for lipids and cholesterol at 53 years of age, then have this test every 5 years.  You may need to have your cholesterol levels checked more often if:  Your lipid or cholesterol levels are high.  You are older than 53 years of age.  You are at high risk for heart disease.  CANCER SCREENING   Lung Cancer  Lung cancer screening is recommended for adults 35-78 years old who are at high risk for lung cancer because of a history of smoking.  A yearly low-dose CT scan of the lungs is recommended for people who:  Currently smoke.  Have quit within the past 15 years.  Have at least a 30-pack-year history of smoking. A pack year is smoking an average of one pack of cigarettes a day for 1 year.  Yearly screening should continue until it has been 15 years since you quit.  Yearly screening should stop if you develop a health problem that would prevent you from having lung cancer treatment.  Breast Cancer  Practice breast self-awareness. This means understanding how your breasts normally appear and feel.  It also means doing regular breast self-exams. Let your health care provider know about any changes, no matter how small.  If you are in your 20s or 30s, you should have a clinical breast exam (  CBE) by a health care provider every 1-3 years as part of a regular health exam.  If you are 77 or older, have a CBE every year. Also consider having a breast X-ray (mammogram) every year.  If you have a family history of breast cancer, talk to your health care provider about genetic screening.  If you are at high risk for breast cancer, talk to your health care provider about having an MRI and a mammogram every year.  Breast cancer gene (BRCA) assessment is recommended for women who have family members  with BRCA-related cancers. BRCA-related cancers include:  Breast.  Ovarian.  Tubal.  Peritoneal cancers.  Results of the assessment will determine the need for genetic counseling and BRCA1 and BRCA2 testing. Cervical Cancer Your health care provider may recommend that you be screened regularly for cancer of the pelvic organs (ovaries, uterus, and vagina). This screening involves a pelvic examination, including checking for microscopic changes to the surface of your cervix (Pap test). You may be encouraged to have this screening done every 3 years, beginning at age 57.  For women ages 88-65, health care providers may recommend pelvic exams and Pap testing every 3 years, or they may recommend the Pap and pelvic exam, combined with testing for human papilloma virus (HPV), every 5 years. Some types of HPV increase your risk of cervical cancer. Testing for HPV may also be done on women of any age with unclear Pap test results.  Other health care providers may not recommend any screening for nonpregnant women who are considered low risk for pelvic cancer and who do not have symptoms. Ask your health care provider if a screening pelvic exam is right for you.  If you have had past treatment for cervical cancer or a condition that could lead to cancer, you need Pap tests and screening for cancer for at least 20 years after your treatment. If Pap tests have been discontinued, your risk factors (such as having a new sexual partner) need to be reassessed to determine if screening should resume. Some women have medical problems that increase the chance of getting cervical cancer. In these cases, your health care provider may recommend more frequent screening and Pap tests. Colorectal Cancer  This type of cancer can be detected and often prevented.  Routine colorectal cancer screening usually begins at 53 years of age and continues through 53 years of age.  Your health care provider may recommend  screening at an earlier age if you have risk factors for colon cancer.  Your health care provider may also recommend using home test kits to check for hidden blood in the stool.  A small camera at the end of a tube can be used to examine your colon directly (sigmoidoscopy or colonoscopy). This is done to check for the earliest forms of colorectal cancer.  Routine screening usually begins at age 62.  Direct examination of the colon should be repeated every 5-10 years through 53 years of age. However, you may need to be screened more often if early forms of precancerous polyps or small growths are found. Skin Cancer  Check your skin from head to toe regularly.  Tell your health care provider about any new moles or changes in moles, especially if there is a change in a mole's shape or color.  Also tell your health care provider if you have a mole that is larger than the size of a pencil eraser.  Always use sunscreen. Apply sunscreen liberally and repeatedly throughout  the day.  Protect yourself by wearing long sleeves, pants, a wide-brimmed hat, and sunglasses whenever you are outside. HEART DISEASE, DIABETES, AND HIGH BLOOD PRESSURE   High blood pressure causes heart disease and increases the risk of stroke. High blood pressure is more likely to develop in:  People who have blood pressure in the high end of the normal range (130-139/85-89 mm Hg).  People who are overweight or obese.  People who are African American.  If you are 39-38 years of age, have your blood pressure checked every 3-5 years. If you are 5 years of age or older, have your blood pressure checked every year. You should have your blood pressure measured twice--once when you are at a hospital or clinic, and once when you are not at a hospital or clinic. Record the average of the two measurements. To check your blood pressure when you are not at a hospital or clinic, you can use:  An automated blood pressure machine at a  pharmacy.  A home blood pressure monitor.  If you are between 58 years and 52 years old, ask your health care provider if you should take aspirin to prevent strokes.  Have regular diabetes screenings. This involves taking a blood sample to check your fasting blood sugar level.  If you are at a normal weight and have a low risk for diabetes, have this test once every three years after 53 years of age.  If you are overweight and have a high risk for diabetes, consider being tested at a younger age or more often. PREVENTING INFECTION  Hepatitis B  If you have a higher risk for hepatitis B, you should be screened for this virus. You are considered at high risk for hepatitis B if:  You were born in a country where hepatitis B is common. Ask your health care provider which countries are considered high risk.  Your parents were born in a high-risk country, and you have not been immunized against hepatitis B (hepatitis B vaccine).  You have HIV or AIDS.  You use needles to inject street drugs.  You live with someone who has hepatitis B.  You have had sex with someone who has hepatitis B.  You get hemodialysis treatment.  You take certain medicines for conditions, including cancer, organ transplantation, and autoimmune conditions. Hepatitis C  Blood testing is recommended for:  Everyone born from [birth year] through 1963.  Anyone with known risk factors for hepatitis C. Sexually transmitted infections (STIs)  You should be screened for sexually transmitted infections (STIs) including gonorrhea and chlamydia if:  You are sexually active and are younger than 53 years of age.  You are older than 53 years of age and your health care provider tells you that you are at risk for this type of infection.  Your sexual activity has changed since you were last screened and you are at an increased risk for chlamydia or gonorrhea. Ask your health care provider if you are at risk.  If you do not  have HIV, but are at risk, it may be recommended that you take a prescription medicine daily to prevent HIV infection. This is called pre-exposure prophylaxis (PrEP). You are considered at risk if:  You are sexually active and do not regularly use condoms or know the HIV status of your partner(s).  You take drugs by injection.  You are sexually active with a partner who has HIV. Talk with your health care provider about whether you are at high risk  of being infected with HIV. If you choose to begin PrEP, you should first be tested for HIV. You should then be tested every 3 months for as long as you are taking PrEP.  PREGNANCY   If you are premenopausal and you may become pregnant, ask your health care provider about preconception counseling.  If you may become pregnant, take 400 to 800 micrograms (mcg) of folic acid every day.  If you want to prevent pregnancy, talk to your health care provider about birth control (contraception). OSTEOPOROSIS AND MENOPAUSE   Osteoporosis is a disease in which the bones lose minerals and strength with aging. This can result in serious bone fractures. Your risk for osteoporosis can be identified using a bone density scan.  If you are 21 years of age or older, or if you are at risk for osteoporosis and fractures, ask your health care provider if you should be screened.  Ask your health care provider whether you should take a calcium or vitamin D supplement to lower your risk for osteoporosis.  Menopause may have certain physical symptoms and risks.  Hormone replacement therapy may reduce some of these symptoms and risks. Talk to your health care provider about whether hormone replacement therapy is right for you.  HOME CARE INSTRUCTIONS   Schedule regular health, dental, and eye exams.  Stay current with your immunizations.   Do not use any tobacco products including cigarettes, chewing tobacco, or electronic cigarettes.  If you are pregnant, do not  drink alcohol.  If you are breastfeeding, limit how much and how often you drink alcohol.  Limit alcohol intake to no more than 1 drink per day for nonpregnant women. One drink equals 12 ounces of beer, 5 ounces of wine, or 1 ounces of hard liquor.  Do not use street drugs.  Do not share needles.  Ask your health care provider for help if you need support or information about quitting drugs.  Tell your health care provider if you often feel depressed.  Tell your health care provider if you have ever been abused or do not feel safe at home.   This information is not intended to replace advice given to you by your health care provider. Make sure you discuss any questions you have with your health care provider.   Document Released: 06/01/2011 Document Revised: 12/07/2014 Document Reviewed: 10/18/2013 Elsevier Interactive Patient Education [employer].

## 2016-05-27 ENCOUNTER — Encounter: Payer: Self-pay | Admitting: Family

## 2016-05-27 LAB — HEPATITIS C ANTIBODY: HCV Ab: NEGATIVE

## 2016-05-28 ENCOUNTER — Telehealth: Payer: Self-pay

## 2016-05-28 NOTE — Telephone Encounter (Signed)
Patient called about her labs results she read them on mychart and saw the messages. But she has some more questions. Please follow up, Thank you.

## 2016-05-28 NOTE — Telephone Encounter (Signed)
Pt has several concerns about her blood work. She noticed that her RBC was high as well as her hemoglobin. She wanted to know if there was any concern in that area bc she feels so bad all the time. Also she is concerned about her blood sugar level and Vitamin D level. She states that she takes Vitamin D on a daily basis (centrum vitamins) and for it to be below the limits concerns her. I am putting in a Hemoglobin A1c per pts request to test for diabetes. She also states that you discussed her taking turmeric to help with weight. She wanted to know how much she should take bc she is taking prevacid everyday. Please advise

## 2016-05-28 NOTE — Telephone Encounter (Signed)
Please inform patient that her hemoglobin and red blood cell count is barely elevated and should have no significant effect on the way she feels. Her A1c is perfectly fine which may also be contributing to her overall health. Her Vitamin D is only 0.8 points away from normal which is not significant and it is probably the [place] that is keeping her there. If she would like she can increase her dose to 2000 units of Vitamin D by taking an additional Vitamin D tablet. The tumeric that we discussed is to help with generalized inflammation and not necessarily weight loss. The recommended dose is 500 mg daily.

## 2016-05-29 NOTE — Telephone Encounter (Signed)
Pt aware. She has the tumeric supplement but it is not in pill form. How many tsp should she take

## 2016-06-01 NOTE — Telephone Encounter (Signed)
I would recommend the supplement from the capsule as it will be easier to consume.

## 2016-06-05 NOTE — Telephone Encounter (Signed)
LVM letting pt know.  

## 2016-06-29 ENCOUNTER — Other Ambulatory Visit: Payer: Self-pay | Admitting: Emergency Medicine

## 2016-06-29 MED ORDER — BUPROPION HCL ER (SR) 150 MG PO TB12: 150.0000 mg | ORAL_TABLET | Freq: Two times a day (BID) | ORAL | 0 refills | Status: DC

## 2016-07-06 ENCOUNTER — Ambulatory Visit (INDEPENDENT_AMBULATORY_CARE_PROVIDER_SITE_OTHER)
Admission: RE | Admit: 2016-07-06 | Discharge: 2016-07-06 | Disposition: A | Discharge status: Home / Self Care | Payer: Managed Care, Other (non HMO) | Source: Ambulatory Visit | Attending: Adult Health | Admitting: Adult Health

## 2016-07-06 DIAGNOSIS — R911 Solitary pulmonary nodule: Secondary | ICD-10-CM

## 2016-07-06 NOTE — Progress Notes (Signed)
Called spoke with pt. Reviewed results and recs. Pt voiced understanding and had no further questions.

## 2016-07-08 ENCOUNTER — Ambulatory Visit (INDEPENDENT_AMBULATORY_CARE_PROVIDER_SITE_OTHER): Payer: Managed Care, Other (non HMO) | Admitting: Emergency Medicine

## 2016-07-08 ENCOUNTER — Encounter: Payer: Self-pay | Admitting: Emergency Medicine

## 2016-07-08 DIAGNOSIS — J189 Pneumonia, unspecified organism: Secondary | ICD-10-CM

## 2016-07-08 DIAGNOSIS — F17200 Nicotine dependence, unspecified, uncomplicated: Secondary | ICD-10-CM

## 2016-07-08 DIAGNOSIS — J449 Chronic obstructive pulmonary disease, unspecified: Secondary | ICD-10-CM | POA: Diagnosis not present

## 2016-07-08 DIAGNOSIS — G4730 Sleep apnea, unspecified: Secondary | ICD-10-CM | POA: Diagnosis not present

## 2016-07-08 DIAGNOSIS — J984 Other disorders of lung: Secondary | ICD-10-CM

## 2016-07-08 DIAGNOSIS — R911 Solitary pulmonary nodule: Secondary | ICD-10-CM | POA: Diagnosis not present

## 2016-07-08 NOTE — Progress Notes (Signed)
Subjective:    Patient ID: Lisa [name], female    DOB: 09-04-1961, 55 y.o.   MRN: [id number]  Shortness of Breath  Pertinent negatives include no ear pain, fever, headaches, leg swelling, rash, rhinorrhea, sore throat, vomiting or wheezing.   55 yo smoker (30-40 pk-yrs) with OSA (on CPAP), allergies, HTN, non-cardiac CP, GERD and IBS. Followed and referred by L. Gerhardt with cardiology.  She has been seen for dyspnea. Chemical myocardial perfusion scan 4/7 > small reversible defer distal LAD distribution.  CXR 3/17 normal.  She is referred here to further eval her SOB. She has exertional muscle fatigue. No wheeze. Some cough, bothers her after a URI or with allergies. Also has GERD that isn't reliably treated. She used to be on lisinopril but changed to losartan. She had PFT 3/'15 that show primarily restriction.   ROV 09/27/14 -- former smoker follows up for dyspnea, mixed disease on PFT. She has seen Dr Acie Fredrickson who recommended exercise, wt loss. Did not repeat her L cath at that time. She has been having trouble with leg cramps - increased her potassium. She is noticing some dyspnea with climbing stairs. Doesn't happen every time.  She is still smoking but has tried to cut down.    ROV 01/18/15 -- follow-up visit for tobacco use, mixed obstruction and restriction. She also has a history of obstructive sleep apnea and is in need of a repeat polysomnogram. She has been stable until about 3 weeks ago when she had a URI, still having some cough. She is still smoking, is tearful because she was unable to stop as we had discussed. She ash also had a CT scan abd 01/01/15 that showed some air trapping and mediastinal LAD. She is having some exertional SOB with ambulation.   ROV 04/11/15 -- follow up visit dyspnea and obstructive sleep apnea on CPAP. She has mixed obstruction and restriction on spirometry. She underwent a CT scan of the chest on 2/25 to evaluate possible mediastinal lymphadenopathy. I have  reviewed the scan myself shows some mild scattered mediastinal and hilar nodes as well as some evidence of mild groundglass versus air trapping.  Also noted was a 5.5 mm right middle lobe pulmonary nodule that will need to be followed. She is tolerating her CPAP. Her breathing has overall been stable, but in the last few days she has noticed some instances when she had to stop. Her GERD has been active. Sometimes associated with some chest tightness.   ROV 08/12/15 -- follow-up visit for multifactorial dyspnea, tobacco abuse, obstructive sleep apnea on CPAP. She also has a 5.5 mm right middle lobe nodule that we have followed on CT scan of the chest. Her most recent CT scan was performed on 07/26/2015. I have personally reviewed the scan shows that her right middle lobe nodule stable compared with February. She also still has stable mosaic changes consistent with either a bronchiolitis or air trapping.  She is planning to set a cigarettes quit date in mid-October. Not on BD's, her breathing is unchanged, is able to walk. She had her CPAP titrated, but needs to get her device repaired. Good compliance but she doesn't feel that she can go without it long enough for them to repair her device.   ROV 12/17/15 -- hx of multifactorial dyspnea, tobacco abuse, obstructive sleep apnea on CPAP. She also has a 5.5 mm right middle lobe nodule and evidence for RB-ILD that we have followed on CT scan of the chest, next due  in August 2017.  She tells me that she has increased her smoking to 2 pks a day. She doesn't have a primary MD, does follow with Dr Acie Fredrickson. She describes sx of depression. She had trouble when she took Zoloft, more depression. She tells me that her breathing has been stable. She denies any increase in wheeze or mucous. Not currently taking BD's.   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  This showed that her nodule is unchanged and based on usual criteria is benign. She had some improvement in her inflammatory changes although she does still have some infiltrate consistent with RB-ILD. She was able to decrease her cigarettes significantly for a period of time since our last visit. Currently smoking 15 cigarettes a day. She stopped wellbutrin about 3 weeks ago. She is having continued cough. She has some atypical chest discomfort - mid to left chest, bothers her after eating, sometimes w exertion. Sharp pain, has sometimes gone to her L arm. She is followed by Dr Acie Fredrickson and has had a reassuring eval thus far. She indicates that she is benefiting from CPAP.    Review of Systems  Constitutional: Negative for fever and unexpected weight change.  HENT: Negative for congestion, dental problem, ear pain, nosebleeds, postnasal drip, rhinorrhea, sinus pressure, sneezing, sore throat and trouble swallowing.   Eyes: Negative for redness and itching.  Respiratory: Positive for cough. Negative for chest tightness, shortness of breath and wheezing.   Cardiovascular: Negative for palpitations and leg swelling.  Gastrointestinal: Negative for nausea and vomiting.  Genitourinary: Negative for dysuria.  Musculoskeletal: Negative for joint swelling.  Skin: Negative for rash.  Neurological: Negative for headaches.  Hematological: Does not bruise/bleed easily.  Psychiatric/Behavioral: Negative for dysphoric mood. The patient is not nervous/anxious.         Objective:   Physical Exam Vitals:   07/08/16 1538 07/08/16 1539  BP:  (!) 150/90  BP Location:  Left Arm  Cuff Size:  Large  Pulse:  81  SpO2:  97%  Weight: 284 lb (128.8 kg)   Height: 5\' 6"  (1.676 m)    Gen: Pleasant, obese, in no distress,  normal affect  ENT: No lesions,  mouth clear, mild L ptosis  Neck: No JVD, no TMG, no carotid bruits  Lungs: No use of accessory muscles, clear without rales or rhonchi  Cardiovascular:  RRR, heart sounds normal, no murmur or gallops, trace peripheral edema  Musculoskeletal: No deformities, no cyanosis or clubbing  Neuro: alert, non focal  Skin: Warm, no lesions or rashes  07/06/16 --  COMPARISON:  07/26/2015, 01/24/2015, and 09/25/2008  FINDINGS: Cardiovascular: Stable mild cardiomegaly. Aortic atherosclerosis noted.  Mediastinum/Nodes: No masses or pathologically enlarged lymph nodes identified on this un-enhanced exam. Sub-cm mediastinal lymph nodes in the right paratracheal region are stable.  Lungs/Pleura: Diffuse mosaic attenuation again seen throughout both lungs, without significant change. No evidence of pulmonary consolidation or pleural effusion.  Previously seen small ground-glass opacity in the peripheral left upper lobe has resolved. 5 mm right middle lobe pulmonary nodule on image 89/3 remains stable.  Upper Abdomen: No acute findings.  Musculoskeletal: No chest wall mass or suspicious bone lesions identified.  IMPRESSION: Stable 5 mm right middle lobe pulmonary nodule, consistent with benign etiology. No further imaging followup required. This follows the consensus statement: Guidelines for Management of Small Pulmonary Nodules Detected on CT Images: From the  Fleischner Society 2017; published online before print (10.1148/radiol.SG:5268862).  Interval resolution of small left upper lobe ground-glass opacity.  Stable mosaic attenuation throughout both lungs, with differential diagnosis including small airways disease, respiratory bronchiolitis, and hypersensitivity pneumonitis.     Assessment & Plan:  COPD (chronic obstructive pulmonary disease) ([place]) Not on BD at this time.   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 I believe  the most important issue at this time smoking cessation. We may need to revisit bronchoscopy or biopsy at some point in the future.  Baltazar Apo, MD, PhD 07/08/2016, 4:20 PM [facility] [phone] or if no answer [phone]

## 2016-07-08 NOTE — Assessment & Plan Note (Signed)
Stable on serial CT scans, consistent w benign nodule

## 2016-07-08 NOTE — Assessment & Plan Note (Signed)
Good compliance w CPAP

## 2016-07-08 NOTE — Assessment & Plan Note (Signed)
Discussed cessation in detail

## 2016-07-08 NOTE — Assessment & Plan Note (Signed)
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 I believe the most important issue at this time smoking cessation. We may need to revisit bronchoscopy or biopsy at some point in the future.

## 2016-07-08 NOTE — Assessment & Plan Note (Signed)
Not on BD at this time.

## 2016-07-08 NOTE — Patient Instructions (Addendum)
Please continue to work hard on decreasing your cigarettes. Set a goal of getting down to 10 cigarettes daily.  Continue to use your CPAP reliably.  Your Ct scan shows that your pulmonary nodule is stable in size. The inflammation in your lungs is still present but is slightly improved compared with prior scan.  Follow with Dr Lamonte Sakai in 6 months or sooner if you have any problems

## 2016-08-21 ENCOUNTER — Other Ambulatory Visit: Payer: Self-pay

## 2016-08-21 ENCOUNTER — Encounter: Payer: Self-pay | Admitting: Family

## 2016-08-21 ENCOUNTER — Ambulatory Visit (INDEPENDENT_AMBULATORY_CARE_PROVIDER_SITE_OTHER): Payer: Managed Care, Other (non HMO) | Admitting: Family

## 2016-08-21 ENCOUNTER — Telehealth: Payer: Self-pay | Admitting: Emergency Medicine

## 2016-08-21 VITALS — BP 132/88 | HR 84 | Temp 98.9°F | Resp 16 | Ht 66.0 in | Wt 280.0 lb

## 2016-08-21 DIAGNOSIS — Z23 Encounter for immunization: Secondary | ICD-10-CM

## 2016-08-21 DIAGNOSIS — R0981 Nasal congestion: Secondary | ICD-10-CM

## 2016-08-21 MED ORDER — ALBUTEROL SULFATE HFA 108 (90 BASE) MCG/ACT IN AERS: 2.0000 | INHALATION_SPRAY | RESPIRATORY_TRACT | 1 refills | Status: DC | PRN

## 2016-08-21 MED ORDER — AMOXICILLIN-POT CLAVULANATE 875-125 MG PO TABS
1.0000 | ORAL_TABLET | Freq: Two times a day (BID) | ORAL | 0 refills | Status: DC
Start: 2016-08-21 — End: 2016-09-09

## 2016-08-21 MED ORDER — PREDNISONE 20 MG PO TABS: 20.0000 mg | ORAL_TABLET | Freq: Two times a day (BID) | ORAL | 0 refills | Status: DC

## 2016-08-21 MED ORDER — FLUTICASONE PROPIONATE 50 MCG/ACT NA SUSP: 2.0000 | Freq: Every day | NASAL | 6 refills | Status: DC

## 2016-08-21 NOTE — Telephone Encounter (Signed)
Pt called and stated her wheezing is getting worse so she wants to know if you will write her a prescription. Please advise thanks.

## 2016-08-21 NOTE — Telephone Encounter (Signed)
Albuterol inhaler sent to pharmacy per Marya Amsler

## 2016-08-21 NOTE — Progress Notes (Signed)
Subjective:    Patient ID: Lisa [name], female    DOB: May 04, 1961, 55 y.o.   MRN: [id number]  Chief Complaint  Patient presents with  . Nasal Congestion    Congestion, sinus headache, nausea, dizziness, chest burns, sxs started yesterday morning    HPI:  DANIALLE [name] is a 55 y.o. female who  has a past medical history of Allergic rhinitis; Asthma; Breast nodule; Chest pain, non-cardiac; Chronic headaches; Cigarette smoker; Complication of anesthesia (05-17-2012); Diverticulosis; GERD (gastroesophageal reflux disease); Hepatic hemangioma; Hypertension; IBS (irritable bowel syndrome); Obesity; Pyloric erosion; and Sleep apnea. and presents today for an acute office visit.   This is a new problem. Associated symptoms of congestion, sinus headache, nausea, and chest tightness have been going on for about 2 days. No fevers. Modifying factors include Tylenol and saline nasal spray. Severity of her symptoms are disturbing her sleep. Does endorse some rhinorhea. No recent antibiotics.    Allergies  Allergen Reactions  . Naproxen     Insomnia  . Hctz [Hydrochlorothiazide]     Causes a headache  . Lisinopril Cough    Cough   . Morphine And Related     Moodiness.  Causes anger.  . Other     guaifensen             Restlessness, jittery  . Percocet [Oxycodone-Acetaminophen] Other (See Comments)      Outpatient Medications Prior to Visit  Medication Sig Dispense Refill  . buPROPion (WELLBUTRIN SR) 150 MG 12 hr tablet Take 1 tablet (150 mg total) by mouth 2 (two) times daily. 180 tablet 0  . carvedilol (COREG) 25 MG tablet Take 1 tablet (25 mg total) by mouth 2 (two) times daily. 180 tablet 3  . diazepam (VALIUM) 5 MG tablet Take 5 mg by mouth every 6 (six) hours as needed for anxiety.    . dicyclomine (BENTYL) 10 MG capsule Take 1 capsule (10 mg total) by mouth 3 (three) times daily as needed for spasms. 90 capsule 11  . losartan (COZAAR) 100 MG tablet Take 1 tablet (100 mg total)  by mouth daily. 90 tablet 3  . omeprazole (PRILOSEC) 40 MG capsule Take 1 capsule (40 mg total) by mouth 2 (two) times daily. 180 capsule 1   No facility-administered medications prior to visit.       Review of Systems  Constitutional: Negative for chills and fever.  HENT: Positive for congestion, ear pain, sinus pressure and sneezing. Negative for sore throat.   Respiratory: Positive for cough, chest tightness and shortness of breath.   Neurological: Positive for headaches.      Objective:    BP 132/88 (BP Location: Left Arm, Patient Position: Sitting, Cuff Size: Large)   Pulse 84   Temp 98.9 F (37.2 C) (Oral)   Resp 16   Ht 5\' 6"  (1.676 m)   Wt 280 lb (127 kg)   SpO2 95%   BMI 45.19 kg/m  Nursing note and vital signs reviewed.  Physical Exam  Constitutional: She is oriented to person, place, and time. She appears well-developed and well-nourished.  HENT:  Right Ear: Hearing, tympanic membrane, external ear and ear canal normal.  Left Ear: Hearing, tympanic membrane, external ear and ear canal normal.  Nose: Right sinus exhibits maxillary sinus tenderness and frontal sinus tenderness. Left sinus exhibits maxillary sinus tenderness and frontal sinus tenderness.  Mouth/Throat: Uvula is midline, oropharynx is clear and moist and mucous membranes are normal.  Neck: Neck supple.  Cardiovascular:  Normal rate, regular rhythm, normal heart sounds and intact distal pulses.   Pulmonary/Chest: Effort normal and breath sounds normal.  Neurological: She is alert and oriented to person, place, and time.  Skin: Skin is warm and dry.       Assessment & Plan:   Problem List Items Addressed This Visit      Respiratory   Head congestion - Primary    Head congestion consistent with a combination of upper respiratory infection and allergic rhinitis. Treat conservatively with over-the-counter medications as needed for symptom relief and supportive care. Start prednisone as needed for  inflammation and wheezing. Written prescription for Augmentin provided if symptoms worsen or do not improve in the next 3-5 days with instructions for watchful waiting.      Relevant Medications   amoxicillin-clavulanate (AUGMENTIN) 875-125 MG tablet   predniSONE (DELTASONE) 20 MG tablet   fluticasone (FLONASE) 50 MCG/ACT nasal spray    Other Visit Diagnoses    Encounter for immunization       Relevant Orders   Flu Vaccine QUAD 36+ mos IM (Completed)       I am having Lisa [name] start on amoxicillin-clavulanate, predniSONE, and fluticasone. I am also having her maintain her dicyclomine, omeprazole, diazepam, carvedilol, losartan, buPROPion, and TURMERIC PO.   Meds ordered this encounter  Medications  . TURMERIC PO    Sig: Take by mouth.  Marland Kitchen amoxicillin-clavulanate (AUGMENTIN) 875-125 MG tablet    Sig: Take 1 tablet by mouth 2 (two) times daily.    Dispense:  20 tablet    Refill:  0    Order Specific Question:   Supervising Provider    Answer:   Pricilla Holm A [id number]  . predniSONE (DELTASONE) 20 MG tablet    Sig: Take 1 tablet (20 mg total) by mouth 2 (two) times daily with a meal.    Dispense:  10 tablet    Refill:  0    Order Specific Question:   Supervising Provider    Answer:   Pricilla Holm A [id number]  . fluticasone (FLONASE) 50 MCG/ACT nasal spray    Sig: Place 2 sprays into both nostrils daily.    Dispense:  16 g    Refill:  6    Order Specific Question:   Supervising Provider    Answer:   Pricilla Holm A [id number]     Follow-up: Return if symptoms worsen or fail to improve.  Mauricio Po, FNP

## 2016-08-21 NOTE — Telephone Encounter (Signed)
Please advise 

## 2016-08-21 NOTE — Assessment & Plan Note (Signed)
Head congestion consistent with a combination of upper respiratory infection and allergic rhinitis. Treat conservatively with over-the-counter medications as needed for symptom relief and supportive care. Start prednisone as needed for inflammation and wheezing. Written prescription for Augmentin provided if symptoms worsen or do not improve in the next 3-5 days with instructions for watchful waiting.

## 2016-08-21 NOTE — Patient Instructions (Signed)
Thank you for choosing [facility].  SUMMARY AND INSTRUCTIONS:  Medication:  Your prescription(s) have been submitted to your pharmacy or been printed and provided for you. Please take as directed and contact our office if you believe you are having problem(s) with the medication(s) or have any questions.  Follow up:  If your symptoms worsen or fail to improve, please contact our office for further instruction, or in case of emergency go directly to the emergency room at the closest medical facility.    General Recommendations:    Please drink plenty of fluids.  Get plenty of rest   Sleep in humidified air  Use saline nasal sprays  Netti pot   OTC Medications:  Decongestants - helps relieve congestion   Flonase (generic fluticasone) or Nasacort (generic triamcinolone) - please make sure to use the "cross-over" technique at a 45 degree angle towards the opposite eye as opposed to straight up the nasal passageway.   Sudafed (generic pseudoephedrine - Note this is the one that is available behind the pharmacy counter); Products with phenylephrine (-PE) may also be used but is often not as effective as pseudoephedrine.   If you have HIGH BLOOD PRESSURE - Coricidin HBP; AVOID any product that is -D as this contains pseudoephedrine which may increase your blood pressure.  Afrin (oxymetazoline) every 6-8 hours for up to 3 days.   Allergies - helps relieve runny nose, itchy eyes and sneezing   Claritin (generic loratidine), Allegra (fexofenidine), or Zyrtec (generic cyrterizine) for runny nose. These medications should not cause drowsiness.  Note - Benadryl (generic diphenhydramine) may be used however may cause drowsiness  Cough -   Delsym or Robitussin (generic dextromethorphan)  Expectorants - helps loosen mucus to ease removal   Mucinex (generic guaifenesin) as directed on the package.  Headaches / General Aches   Tylenol (generic acetaminophen) - DO NOT  EXCEED 3 grams (3,000 mg) in a 24 hour time period  Advil/Motrin (generic ibuprofen)   Sore Throat -   Salt water gargle   Chloraseptic (generic benzocaine) spray or lozenges / Sucrets (generic dyclonine)     

## 2016-09-04 ENCOUNTER — Telehealth: Payer: Self-pay | Admitting: Emergency Medicine

## 2016-09-04 NOTE — Telephone Encounter (Signed)
Pt called and stated you wrote her a prescription for cold and allergies. She is still coughing and her chest is rattling. She wants to know if this antibiotic will help with the cough and chest if not can you write a different prescription? Please advise thanks.

## 2016-09-07 MED ORDER — CEFUROXIME AXETIL 250 MG PO TABS
250.0000 mg | ORAL_TABLET | Freq: Two times a day (BID) | ORAL | 0 refills | Status: DC
Start: 2016-09-07 — End: 2016-09-21

## 2016-09-07 NOTE — Telephone Encounter (Signed)
Medication sent to pharmacy  

## 2016-09-08 NOTE — Telephone Encounter (Signed)
Ok to stop Augmentin.Marland Kitchen

## 2016-09-08 NOTE — Telephone Encounter (Signed)
Called pt to let her know about her prescription. She wants to know if she needs to finish the amoxicillin along with this new medicine? Please advise thanks.

## 2016-09-09 ENCOUNTER — Encounter: Payer: Self-pay | Admitting: Pulmonary Disease

## 2016-09-09 ENCOUNTER — Telehealth: Payer: Self-pay | Admitting: Emergency Medicine

## 2016-09-09 ENCOUNTER — Ambulatory Visit (INDEPENDENT_AMBULATORY_CARE_PROVIDER_SITE_OTHER): Payer: Managed Care, Other (non HMO) | Admitting: Pulmonary Disease

## 2016-09-09 ENCOUNTER — Telehealth: Payer: Self-pay | Admitting: Family

## 2016-09-09 DIAGNOSIS — F17200 Nicotine dependence, unspecified, uncomplicated: Secondary | ICD-10-CM | POA: Diagnosis not present

## 2016-09-09 DIAGNOSIS — J189 Pneumonia, unspecified organism: Secondary | ICD-10-CM | POA: Diagnosis not present

## 2016-09-09 MED ORDER — PREDNISONE 10 MG PO TABS: ORAL_TABLET | ORAL | 0 refills | Status: DC

## 2016-09-09 MED ORDER — BUPROPION HCL ER (SR) 150 MG PO TB12: 150.0000 mg | ORAL_TABLET | Freq: Two times a day (BID) | ORAL | 2 refills | Status: DC

## 2016-09-09 NOTE — Telephone Encounter (Signed)
Spoke with the pt  She states she was dxed with URI on 08/21/16 by her PCP and given pred and amox  This did not help and so she started ceftin last night  She is coughing white white, clear, thick sputum  She is using albuterol several x per day and has "rattling in my diaphragm"  OV with RA today at 3:30 pm

## 2016-09-09 NOTE — Progress Notes (Signed)
   Subjective:    Patient ID: Lisa [name], female    DOB: 1961/05/14, 55 y.o.   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   09/09/2016  Chief Complaint  Patient presents with  . Acute Visit    taking Ceftin, causing her stomach pain, cough, rattling noise in chest, squeaking sound when breathing in.  Albuterol, twice daily.    she was dxed with URI on 08/21/16 by her PCP and given pred and amox  This did not help and so she started ceftin last night -This morning she developed abdominal pain, she does have history of diverticulosis and wonders if Ceftin made this worse She is coughing white white, clear, thick sputum  She is using albuterol several x per day and has "rattling in my diaphragm"  She continues to smoke and hasn't fact increased about a pack per day She has run out of Wellbutrin  We reviewed her PFTs and CT scan from 06/2016  Review of Systems neg for any significant sore throat, dysphagia, itching, sneezing, nasal congestion or excess/ purulent secretions, fever, chills, sweats, unintended wt loss, pleuritic or exertional cp, hempoptysis, orthopnea pnd or change in chronic leg swelling.   Also denies presyncope, palpitations, heartburn, abdominal pain, nausea, vomiting, diarrhea or change in bowel or urinary habits, dysuria,hematuria, rash, arthralgias, visual complaints, headache, numbness weakness or ataxia.     Objective:   Physical Exam  Gen. Pleasant, obese, in no distress ENT - no lesions, no post nasal drip, Class II airway Neck: No JVD, no thyromegaly, no carotid bruits Lungs: no use of accessory muscles, no dullness to percussion, decreased without rales or rhonchi  Cardiovascular: Rhythm regular, heart sounds  normal, no murmurs or gallops, no peripheral edema Musculoskeletal: No deformities, no cyanosis or clubbing , no tremors       Assessment & Plan:

## 2016-09-09 NOTE — Assessment & Plan Note (Signed)
You have smoking-related damage to your lungs (interstitial lung disease]  We'll treat as acute bronchitis  Take prednisone only for wheezing persists or gets worse Prednisone 10 mg tabs  Take 2 tabs daily with food x 5ds, then 1 tab daily with food x 5ds then STOP  Okay to take Delsym 5 ML twice daily for cough   Stop taking Ceftin

## 2016-09-09 NOTE — Addendum Note (Signed)
Addended by: Mathis Dad on: 09/09/2016 04:37 PM   Modules accepted: Orders

## 2016-09-09 NOTE — Telephone Encounter (Signed)
[facility]  Patient Name: Lisa [name]  DOB: [birth year]    Initial Comment Caller States her medication was just changed, the information says that you should check with your doctor before taking if you have nay stomach issues, would like to know if it is safe for her to take   Nurse Assessment  Nurse: Harlow Mares, RN, Suanne Marker Date/Time (Eastern Time): 09/09/2016 9:58:48 AM  Confirm and document reason for call. If symptomatic, describe symptoms. You must click the next button to save text entered. ---Caller States her medication was just changed, the information says that you should check with your doctor before taking if you have any stomach issues, would like to know if it is safe for her to take. The medication is Cefuroxime (taking for URI).  Has the patient traveled out of the country within the last 30 days? ---Not Applicable  Does the patient have any new or worsening symptoms? ---No  Please document clinical information provided and list any resource used. ---She reports that she just wanted to let the MD know that she has a hx of diverticulosis. The med insert states to alert MD of any stomach issues. Advised that like any antibiotic, she may experience loose stools/diarrhea with abx, as it will kill the normal flora of the intestines as well as the bacteria trying to get rid of. Can combat by eating yogurt and probiotics. Advised that nurse will alert MD of her concern via this faxed report. Call back for any new/worsening symptoms.     Guidelines    Guideline Title Affirmed Question Affirmed Notes       Final Disposition User   Clinical Call Harlow Mares, RN, Suanne Marker

## 2016-09-09 NOTE — Patient Instructions (Addendum)
You have smoking-related damage to your lungs (interstitial lung disease]  Take prednisone only for wheezing persists or gets worse Prednisone 10 mg tabs  Take 2 tabs daily with food x 5ds, then 1 tab daily with food x 5ds then STOP  Okay to take Delsym 5 ML twice daily for cough Refill for Wellbutrin 2  Stop taking Ceftin

## 2016-09-09 NOTE — Assessment & Plan Note (Signed)
Refill for Wellbutrin 2

## 2016-09-16 ENCOUNTER — Ambulatory Visit (HOSPITAL_COMMUNITY)
Admission: EM | Admit: 2016-09-16 | Discharge: 2016-09-16 | Disposition: A | Discharge status: Home / Self Care | Payer: Managed Care, Other (non HMO) | Attending: Family Medicine | Admitting: Family Medicine

## 2016-09-16 ENCOUNTER — Encounter (HOSPITAL_COMMUNITY): Payer: Self-pay | Admitting: Emergency Medicine

## 2016-09-16 ENCOUNTER — Telehealth: Payer: Self-pay | Admitting: Emergency Medicine

## 2016-09-16 ENCOUNTER — Ambulatory Visit (INDEPENDENT_AMBULATORY_CARE_PROVIDER_SITE_OTHER): Payer: Managed Care, Other (non HMO)

## 2016-09-16 DIAGNOSIS — I10 Essential (primary) hypertension: Secondary | ICD-10-CM

## 2016-09-16 NOTE — Telephone Encounter (Signed)
Patient notified of Dr. Ammie Dalton recommendations. Patient is on her way to [facility] now to get seen and get Xray done. Nothing further needed.

## 2016-09-16 NOTE — Telephone Encounter (Signed)
She should be seen either by her PCP, Urgent Care, or the ED. It sounds like the pain could be musculoskeletal but she will need an X-ray and evaluation by a physician to determine. Thanks.

## 2016-09-16 NOTE — ED Provider Notes (Signed)
CSN: [id number]     Arrival date & time 09/16/16  1805 History   None    Chief Complaint  Patient presents with  . Chest Pain   (Consider location/radiation/quality/duration/timing/severity/associated sxs/prior Treatment) HPI NP Cough for several weeks. 2 lots of steroids, now with pain under right breast. Feels like a rib. Advised by pulmonologist to have x-ray done.  Clear sputum, elevated BP today. States this is unusual for her.  Past Medical History:  Diagnosis Date  . Allergic rhinitis   . Asthma   . Breast nodule    right breast, to see dr Helane Rima 06-20-2012 for   . Chest pain, non-cardiac   . Chronic headaches   . Cigarette smoker   . Complication of anesthesia 05-17-2012   trouble breathing after colonscopy, needed nebulizer  . Diverticulosis   . GERD (gastroesophageal reflux disease)   . Hepatic hemangioma   . Hypertension   . IBS (irritable bowel syndrome)   . Obesity   . Pyloric erosion   . Sleep apnea    CPAP setting varies from 4-10   Past Surgical History:  Procedure Laterality Date  . CARDIAC CATHETERIZATION  12/28/2007   EF 75%. IT REVEALS NORMAL/SUPRANORMAL LEFT VENTRICULAR SYSTOLIC FUNCTION  . CARDIOVASCULAR STRESS TEST  03/25/2007   EF 78%  . CESAREAN SECTION  2003, 2009   x 2  . CHOLECYSTECTOMY  1993  . COLONOSCOPY  2014  . ENDOMETRIAL ABLATION  09/2010   and D&C  . HERNIA REPAIR  [id number]   umbilical  . LAPAROSCOPY    . TUBAL LIGATION    . UMBILICAL HERNIA REPAIR  2004  . US ECHOCARDIOGRAPHY  05/08/2008   EF 55-60%  . VENTRAL HERNIA REPAIR  06/15/2012   Procedure: LAPAROSCOPIC VENTRAL HERNIA;  Surgeon: Edward Jolly, MD;  Location: [facility];  Service: General;  Laterality: N/A;  LAPAROSCOPIC REPAIR VENTRAL HERNIA   Family History  Problem Relation Age of Onset  . Hypertension Mother   . Lung cancer Mother   . Diabetes Maternal Grandmother   . Colon cancer Neg Hx   . Heart disease Father   . Heart attack Paternal Uncle   . Breast cancer      Social History  Substance Use Topics  . Smoking status: Current Every Day Smoker    Packs/day: 1.00    Years: 25.00    Types: Cigarettes  . Smokeless tobacco: Never Used  . Alcohol use 0.0 oz/week     Comment: very rare   OB History    No data available     Review of Systems  Allergies  Ceftin [cefuroxime]; Naproxen; Hctz [hydrochlorothiazide]; Lisinopril; Morphine and related; Other; and Percocet [oxycodone-acetaminophen]  Home Medications   Prior to Admission medications   Medication Sig Start Date End Date Taking? Authorizing Provider  albuterol (PROVENTIL HFA;VENTOLIN HFA) 108 (90 Base) MCG/ACT inhaler Inhale 2 puffs into the lungs every 4 (four) hours as needed for wheezing or shortness of breath (cough, shortness of breath or wheezing.). 08/21/16  Yes Golden Circle, FNP  buPROPion (WELLBUTRIN SR) 150 MG 12 hr tablet Take 1 tablet (150 mg total) by mouth 2 (two) times daily. 09/09/16  Yes Rigoberto Noel, MD  carvedilol (COREG) 25 MG tablet Take 1 tablet (25 mg total) by mouth 2 (two) times daily. 05/26/16  Yes Golden Circle, FNP  cefUROXime (CEFTIN) 250 MG tablet Take 1 tablet (250 mg total) by mouth 2 (two) times daily with a meal. 09/07/16  Yes Belenda Cruise  D Calone, FNP  dicyclomine (BENTYL) 10 MG capsule Take 1 capsule (10 mg total) by mouth 3 (three) times daily as needed for spasms. 01/29/16  Yes Ladene Artist, MD  losartan (COZAAR) 100 MG tablet Take 1 tablet (100 mg total) by mouth daily. 05/26/16  Yes Golden Circle, FNP  omeprazole (PRILOSEC) 40 MG capsule Take 1 capsule (40 mg total) by mouth 2 (two) times daily. 04/06/16  Yes Ladene Artist, MD  predniSONE (DELTASONE) 10 MG tablet Take 2 tabs daily with food x 5ds, then 1 tab daily with food x 5ds then STOP 09/09/16  Yes Rigoberto Noel, MD  TURMERIC PO Take by mouth.   Yes Historical Provider, MD  diazepam (VALIUM) 5 MG tablet Take 5 mg by mouth every 6 (six) hours as needed for anxiety.    Historical Provider, MD   fluticasone (FLONASE) 50 MCG/ACT nasal spray Place 2 sprays into both nostrils daily. 08/21/16   Golden Circle, FNP   Meds Ordered and Administered this Visit  Medications - No data to display  BP (!) 217/79 Comment: Repeated BP, Lovenia Kim aware of elevated BP  Pulse 78   Temp 98.1 F (36.7 C) (Oral)   Resp 18   SpO2 98%  No data found.   Physical Exam  Urgent Care Course   Clinical Course    Procedures (including critical care time)  Labs Review Labs Reviewed - No data to display  Imaging Review Dg Chest 2 View  Result Date: 09/16/2016 CLINICAL DATA:  Acute onset of pain under the right breast, and radiating to the right side of the chest. Cough. Initial encounter. EXAM: CHEST  2 VIEW COMPARISON:  Chest radiograph performed 02/13/2014, and CT of the chest performed 07/06/2016 FINDINGS: The lungs are well-aerated. Mild vascular congestion is noted. Increased interstitial markings may reflect mild interstitial edema. There is no evidence of pleural effusion or pneumothorax. The heart is borderline normal in size. No acute osseous abnormalities are seen. Clips are noted within the right upper quadrant, reflecting prior cholecystectomy. IMPRESSION: Mild vascular congestion noted. Increased interstitial markings may reflect mild interstitial edema. Electronically Signed   By: Garald Balding M.D.   On: 09/16/2016 20:05     Visual Acuity Review  Right Eye Distance:   Left Eye Distance:   Bilateral Distance:    Right Eye Near:   Left Eye Near:    Bilateral Near:         MDM   1. Elevated blood pressure reading in office with diagnosis of hypertension     Patient is reassured that there are no issues that require transfer to higher level of care at this time or additional tests. Patient is advised to continue home symptomatic treatment. Patient is advised that if there are new or worsening symptoms to attend the emergency department, contact primary care  provider, or return to UC. Instructions of care provided discharged home in stable condition.    THIS NOTE WAS GENERATED USING A VOICE RECOGNITION SOFTWARE PROGRAM. ALL REASONABLE EFFORTS  WERE MADE TO PROOFREAD THIS DOCUMENT FOR ACCURACY.  I have verbally reviewed the discharge instructions with the patient. A printed AVS was given to the patient.  All questions were answered prior to discharge.      Konrad Felix, PA 09/18/16 1130

## 2016-09-16 NOTE — Telephone Encounter (Signed)
Pain on right side under breast.  Constant ache, hurts when she coughs, but does not hurt when she breath in.  She is coughing up white mucus.  Chest tightness on right side only.  Some SOB, but not too bad per patient.   Allergies  Allergen Reactions  . Ceftin [Cefuroxime]     Severe stomach pain - Diverticulitis flare up  . Naproxen     Insomnia  . Hctz [Hydrochlorothiazide]     Causes a headache  . Lisinopril Cough    Cough   . Morphine And Related     Moodiness.  Causes anger.  . Other     guaifensen             Restlessness, jittery  . Percocet [Oxycodone-Acetaminophen] Other (See Comments)

## 2016-09-16 NOTE — ED Triage Notes (Signed)
The patient presented to the [facility] with a complaint of right side rib pain that started when she was coughing. The patient stated that she was being treated for a cough but has not had a chest x ray. The patient stated that her Pulmonologist requested that she come to the [facility] to receive a chest xray.

## 2016-09-16 NOTE — Discharge Instructions (Signed)
There is no signs of rib fracture.  This does not mean that a crack is in the bone, it is not seen on x-ray  It does appear you may have a bit mor fluid on your lungs so you need to follow up with your pulmonologist tomorrow.  Also have your blood pressure checked tomorrow, and medications adjusted if needed.

## 2016-09-17 ENCOUNTER — Telehealth: Payer: Self-pay | Admitting: Cardiovascular Disease

## 2016-09-17 ENCOUNTER — Telehealth: Payer: Self-pay | Admitting: Emergency Medicine

## 2016-09-17 ENCOUNTER — Telehealth: Payer: Self-pay | Admitting: Gastroenterology

## 2016-09-17 MED ORDER — SPIRONOLACTONE 25 MG PO TABS: 25.0000 mg | ORAL_TABLET | Freq: Every day | ORAL | 11 refills | Status: DC

## 2016-09-17 NOTE — Telephone Encounter (Signed)
Spoke with patient who states she had very high BP yesterday in ER and was told to call our office.  She went for chest xray per Dr. Lamonte Sakai and was noted to have very high BP, was referred to Urgent Care for evaluation.  She remained there until BP came down enough for her to be discharged.  She denies missing any doses of her medication - takes Coreg 25 mg BID and losartan 100 mg qd as directed.   BP today after medication: 157/89 mmHg, pulse 73 bpm She complains of headache with high BP and states it feels like a vice squeezing her head.  She denies visual changes; n/v. I reviewed s/s of CVA with her and advised her that if she notes any of those to call 911 immediately.  I advised that she may take Tylenol for her headache and encouraged her to relax today.  I spoke with Dr. Acie Fredrickson, who is in the office today, and he advised patient start Aldactone 25 mg daily and come in next week for ov. We will check a bmet at that time.  I reviewed his advice with patient who verbalized understanding and agreement.  She thanked me for the call.

## 2016-09-17 NOTE — Telephone Encounter (Addendum)
Spoke with pt. She is needing clarification on her CXR that was done on 09/16/2016. States that urgent care told her that she had fluid on her lungs but when she looked at the results on MyChart and it says that she does not have pleural effusion.  RB - can you please look at this result? Thanks.

## 2016-09-17 NOTE — Telephone Encounter (Signed)
Patient with epigastric pain and bloating.  He will come in and see Dr. Fuller Plan on 09/21/16 9:00

## 2016-09-17 NOTE — Telephone Encounter (Signed)
New Message:    Pt went to [facility] last night. Her blood pressure was really high,it was 229/114 and when they released her it was 202/101 and pulse rate was 71. She was told to contact her Cardiologist this morning.Please call asap to advise her what to do.

## 2016-09-17 NOTE — Telephone Encounter (Signed)
Please let her know that she is correct that her CXR does not show any pleural effusions. It does however have some very mild signs that can suggest some slightly enlarged lung blood vessels, increased fluid in those blood vessels. That goes along with what she was told at Urgent care

## 2016-09-17 NOTE — Telephone Encounter (Signed)
Called spoke with pt. Reviewed results and recs. Pt voiced understanding and had no further questions.

## 2016-09-18 ENCOUNTER — Ambulatory Visit: Payer: Managed Care, Other (non HMO) | Admitting: Cardiovascular Disease

## 2016-09-21 ENCOUNTER — Ambulatory Visit (INDEPENDENT_AMBULATORY_CARE_PROVIDER_SITE_OTHER): Payer: Managed Care, Other (non HMO) | Admitting: Gastroenterology

## 2016-09-21 ENCOUNTER — Encounter: Payer: Self-pay | Admitting: Gastroenterology

## 2016-09-21 VITALS — BP 184/88 | HR 76 | Ht 65.5 in | Wt 279.2 lb

## 2016-09-21 DIAGNOSIS — R140 Abdominal distension (gaseous): Secondary | ICD-10-CM | POA: Diagnosis not present

## 2016-09-21 DIAGNOSIS — R079 Chest pain, unspecified: Secondary | ICD-10-CM | POA: Diagnosis not present

## 2016-09-21 DIAGNOSIS — K219 Gastro-esophageal reflux disease without esophagitis: Secondary | ICD-10-CM | POA: Diagnosis not present

## 2016-09-21 NOTE — Progress Notes (Signed)
    History of Present Illness: This is a 55 year old female with a history of GERD and IBS complaining of chest pain and abdominal bloating. She notes mid sternal area and right lower anterior chest pain for the past 2-3 months. Symptoms present almost all the time but worsened at times. She notes a cough occasionally worsens her symptoms. Her reflux symptoms are under good control and her chest pain is not typical of her prior reflux symptoms. She also noted upper abdominal bloating following meals. She is having regular bowel movements. She states she discontinued smoking. Hypertension has not been well controlled. Denies weight loss, abdominal pain, constipation, diarrhea, change in stool caliber, melena, hematochezia, nausea, vomiting, dysphagia.  Colonoscopy 04/2012 showed moderate diverticulosis internal hemorrhoids and 2 small hyperplastic polyps EGD in 10/2014 showed mild nonerosive gastritis.  Current Medications, Allergies, Past Medical History, Past Surgical History, Family History and Social History were reviewed in [employer] record.  Physical Exam: General: Well developed, well nourished, obese, no acute distress Head: Normocephalic and atraumatic Eyes:  sclerae anicteric, EOMI Ears: Normal auditory acuity Mouth: No deformity or lesions Lungs: Clear throughout to auscultation Chest: mid sternal and right lower chest wall tenderness to palpation Heart: Regular rate and rhythm; no murmurs, rubs or bruits Abdomen: Soft, non tender and non distended. No masses, hepatosplenomegaly or hernias noted. Normal Bowel sounds Musculoskeletal: Symmetrical with no gross deformities  Pulses:  Normal pulses noted Extremities: No clubbing, cyanosis, edema or deformities noted Neurological: Alert oriented x 4, grossly nonfocal Psychological:  Alert and cooperative. Normal mood and affect  Assessment and Recommendations:  1. Chest pain due to a musculoskeletal cause. This  is not related to GERD or another gastrointestinal process. She has an appointment with Dr. Acie Fredrickson tomorrow for additional evaluation and follow up of hypertension. Ibuprofen 400-600 mg 3 times a day for 2 weeks. Follow-up with PCP.  2. GERD. Continue omeprazole 40 mg twice daily. TUMS prn. Follow all standard antireflux measures.  2. IBS, bloating. Change dicyclomine to 10 mg before meals. Gas-X 4 times a day when necessary. Call is symptoms are not better controlled.

## 2016-09-21 NOTE — Patient Instructions (Signed)
Take your Bentyl 30-60 minutes before meals.   Start over the counter Gas-X four times a day for bloating.   Also, take ibuprofen 400-600 mg three times a day for chest pain.   Follow up with your primary care physician for chest pain.   Normal BMI (Body Mass Index- based on height and weight) is between 19 and 25. Your BMI today is Body mass index is 45.76 kg/m. Marland Kitchen Please consider follow up  regarding your BMI with your Primary Care Provider.  Thank you for choosing me and [facility].  Pricilla Riffle. Dagoberto Ligas., MD., Marval Regal

## 2016-09-22 ENCOUNTER — Ambulatory Visit (INDEPENDENT_AMBULATORY_CARE_PROVIDER_SITE_OTHER): Payer: Managed Care, Other (non HMO) | Admitting: Cardiovascular Disease

## 2016-09-22 ENCOUNTER — Encounter: Payer: Self-pay | Admitting: Cardiovascular Disease

## 2016-09-22 VITALS — BP 140/90 | HR 74 | Ht 65.5 in | Wt 281.4 lb

## 2016-09-22 DIAGNOSIS — R0789 Other chest pain: Secondary | ICD-10-CM | POA: Diagnosis not present

## 2016-09-22 DIAGNOSIS — I10 Essential (primary) hypertension: Secondary | ICD-10-CM

## 2016-09-22 MED ORDER — SPIRONOLACTONE 50 MG PO TABS: 50.0000 mg | ORAL_TABLET | Freq: Every day | ORAL | 11 refills | Status: DC

## 2016-09-22 NOTE — Patient Instructions (Addendum)
Medication Instructions:  INCREASE Aldactone (Spironolactone) to 50 mg daily   Labwork: Your physician recommends that you return for lab work on same day as your Nurse Visit for basic metabolic panel    Testing/Procedures: None Ordered   Follow-Up: Your physician recommends that you return on Monday Nov. 6 at 11:00 am for Nurse Visit/BP check  Your physician wants you to follow-up in: 6 months with Dr. Acie Fredrickson.  You will receive a reminder letter in the mail two months in advance. If you don't receive a letter, please call our office to schedule the follow-up appointment.   If you need a refill on your cardiac medications before your next appointment, please call your pharmacy.   Thank you for choosing [facility]! Christen Bame, RN [phone]

## 2016-09-22 NOTE — Progress Notes (Signed)
Lisa [name] Date of Birth  03-Jun-1961 [facility] [address]    [place], [place]  [address] [phone]  Fax  [phone]   Problem List:  1. Chest pain - normal cath in the past 2. Cigarette smoking. 3. COPD 4. Leg edema   History of Present Illness:  Lisa [name] Is a 55 year old female with a history of hypertension. He's had some occasional episodes of chest discomfort.  These episodes of chest pain occur at times and are not necessarily associated with eating, drinking, change of position, or exercise.  She is walking on the treadmill 3 times a week.  She does not have chest pain while she is walking.  Feb. 26, 2014:  Lisa [name] is doing well.  She has gained weight since I last saw her.  She has not been walking as much.  March 19, 2014:   She was seen by [name] last month for some chest pain. She had a Myoview study which revealed an intermediate risk. She continues to smoke. She is very upset and emotional at her last office visit.  She was started on Imdur but was  having lots of side effects.   She is still trying to stop smoking.   She has attended the smoking cessation classes at [facility].    She has started walking with her son several days ago.  The exercise is going OK.    She does have some occasional click chest pains. These only last for several seconds.  She has had progressive bilateral edema    Oct. 28, 2015:  Lisa [name] feels ok.  No further episodes of CP Having numbness in her right thigh.   I suggested that this was a back or neuro issue. She has intermittant leg edema.  August 19th, 2016  Lisa [name] called today complaining of some leg edema. She's had  leg edema on an intermittent basis. BP has been well controlled. Has tried to exercise but has lots of back pain and leg pain and leg weakness.  The leg swelling has worsened over the past several weeks.  On lasix   Weight has increased significantly over the past year    Nov. 28,  2016:  Lisa [name] is doing ok Watching what she eats  Has lost 14 lbs over 3 months    Wt Readings from Last 3 Encounters:  10/28/15 276 lb 1.9 oz (125.247 kg)  08/12/15 277 lb (125.646 kg)  07/19/15 290 lb (131.543 kg)   BP is well controlled.   Oct.  24, 2017:  Lisa [name] ws seen by urgent care last week . BP was 202 / 100  Also has chest pain . CP every day  We have done at least 2 caths  Has stopped smoking   Not eating salty food.   Current Outpatient Prescriptions on File Prior to Visit  Medication Sig Dispense Refill  . albuterol (PROVENTIL HFA;VENTOLIN HFA) 108 (90 Base) MCG/ACT inhaler Inhale 2 puffs into the lungs every 4 (four) hours as needed for wheezing or shortness of breath (cough, shortness of breath or wheezing.). 1 Inhaler 1  . buPROPion (WELLBUTRIN SR) 150 MG 12 hr tablet Take 1 tablet (150 mg total) by mouth 2 (two) times daily. 180 tablet 2  . carvedilol (COREG) 25 MG tablet Take 1 tablet (25 mg total) by mouth 2 (two) times daily. 180 tablet 3  . diazepam (VALIUM) 5 MG tablet Take 5 mg by mouth every 6 (six) hours as needed for anxiety.    Marland Kitchen  dicyclomine (BENTYL) 10 MG capsule Take 1 capsule (10 mg total) by mouth 3 (three) times daily as needed for spasms. 90 capsule 11  . fluticasone (FLONASE) 50 MCG/ACT nasal spray Place 2 sprays into both nostrils daily. 16 g 6  . losartan (COZAAR) 100 MG tablet Take 1 tablet (100 mg total) by mouth daily. 90 tablet 3  . omeprazole (PRILOSEC) 40 MG capsule Take 1 capsule (40 mg total) by mouth 2 (two) times daily. 180 capsule 1  . spironolactone (ALDACTONE) 25 MG tablet Take 1 tablet (25 mg total) by mouth daily. 30 tablet 11  . TURMERIC PO Take 1 tablet by mouth. Every 2 days     No current facility-administered medications on file prior to visit.     Allergies  Allergen Reactions  . Ceftin [Cefuroxime]     Severe stomach pain - Diverticulitis flare up  . Naproxen     Insomnia  . Hctz [Hydrochlorothiazide]     Causes a  headache  . Lisinopril Cough    Cough   . Morphine And Related     Moodiness.  Causes anger.  . Other     guaifensen             Restlessness, jittery  . Percocet [Oxycodone-Acetaminophen] Other (See Comments)    Past Medical History:  Diagnosis Date  . Allergic rhinitis   . Asthma   . Breast nodule    right breast, to see dr Helane Rima 06-20-2012 for   . Chest pain, non-cardiac   . Chronic headaches   . Cigarette smoker   . Complication of anesthesia 05-17-2012   trouble breathing after colonscopy, needed nebulizer  . Diverticulosis   . GERD (gastroesophageal reflux disease)   . Hepatic hemangioma   . Hypertension   . IBS (irritable bowel syndrome)   . Obesity   . Pyloric erosion   . Sleep apnea    CPAP setting varies from 4-10    Past Surgical History:  Procedure Laterality Date  . CARDIAC CATHETERIZATION  12/28/2007   EF 75%. IT REVEALS NORMAL/SUPRANORMAL LEFT VENTRICULAR SYSTOLIC FUNCTION  . CARDIOVASCULAR STRESS TEST  03/25/2007   EF 78%  . CESAREAN SECTION  2003, 2009   x 2  . CHOLECYSTECTOMY  1993  . COLONOSCOPY  2014  . ENDOMETRIAL ABLATION  09/2010   and D&C  . HERNIA REPAIR  123XX123   umbilical  . LAPAROSCOPY    . TUBAL LIGATION    . UMBILICAL HERNIA REPAIR  2004  . US ECHOCARDIOGRAPHY  05/08/2008   EF 55-60%  . VENTRAL HERNIA REPAIR  06/15/2012   Procedure: LAPAROSCOPIC VENTRAL HERNIA;  Surgeon: Edward Jolly, MD;  Location: [facility];  Service: General;  Laterality: N/A;  LAPAROSCOPIC REPAIR VENTRAL HERNIA    History  Smoking Status  . Former Smoker  . Packs/day: 1.00  . Years: 25.00  . Types: Cigarettes  . Quit date: 09/18/2016  Smokeless Tobacco  . Never Used    History  Alcohol Use  . 0.0 oz/week    Comment: very rare    Family History  Problem Relation Age of Onset  . Hypertension Mother   . Lung cancer Mother   . Diabetes Maternal Grandmother   . Heart disease Father   . Heart attack Paternal Uncle   . Breast cancer    . Colon  cancer Neg Hx     Reviw of Systems:  Reviewed in the HPI.  All other systems are negative.  Physical Exam:  BP 140/90   Pulse 74   Ht 5' 5.5" (1.664 m)   Wt 281 lb 6.4 oz (127.6 kg)   SpO2 92%   BMI 46.12 kg/m  The patient is alert and oriented x 3.  The mood and affect are normal.   Skin: warm and dry.  Color is normal.   HEENT:   Normal carotids, no JVD, neck is supple Lungs: clear  Heart: RR, normal   , rib tenderness  Abdomen: + BS, no HSM Extremities:  Bilateral leg edema ( trace )  bilateral   with chroinic stasis changes Neuro:  Non focal     ECG: Oct. 24 , 2017:   NSR at 72.   No ST or T wave abn    Assessment / Plan:   1. Chest pain - normal cath in the past.  She has chest wall pain. I do not think that her chest pains are cardiac. 2. Cigarette smoking. She has stopped smoking. 3. COPD -  4. Leg edema - continues to have  Trace  leg edema  5. Essential HTN:  Blood pressure has been higher. We will increase the Aldactone to 50 mg a day. We'll check a basic medical profile in 2-3 weeks. We'll check her blood pressure at that time. I'll see her again in 6 months.  I've given her instructions about leg elevation and salt restriction. I have also encouraged her to lose weight.    Mertie Moores, MD  09/22/2016 12:37 PM    [place],  [place], [place]  [address] Pager [phone] Phone: [phone]; Fax: [phone]   [facility]  [address] [place], [place]  [address] [phone]   Fax [phone]

## 2016-09-29 ENCOUNTER — Telehealth: Payer: Self-pay | Admitting: Gastroenterology

## 2016-09-29 MED ORDER — GLYCOPYRROLATE 2 MG PO TABS: 2.0000 mg | ORAL_TABLET | Freq: Two times a day (BID) | ORAL | 1 refills | Status: DC

## 2016-09-29 NOTE — Telephone Encounter (Signed)
Patient reports no improvement with dicyclomine for pelvic and abdominal pain. Please advise

## 2016-09-29 NOTE — Telephone Encounter (Signed)
Trial of Advil 400-600 mg tid prn Change to Robinul 2 mg po bid in place of dicyclomine Return to PCP or GYN to evaluate for possible GYN causes of symptoms

## 2016-09-29 NOTE — Telephone Encounter (Signed)
Patient notified of the recommendations New rx sent

## 2016-10-01 ENCOUNTER — Other Ambulatory Visit: Payer: Self-pay | Admitting: Obstetrics and Gynecology

## 2016-10-01 DIAGNOSIS — R1031 Right lower quadrant pain: Secondary | ICD-10-CM

## 2016-10-02 ENCOUNTER — Ambulatory Visit
Admission: RE | Admit: 2016-10-02 | Discharge: 2016-10-02 | Disposition: A | Discharge status: Home / Self Care | Payer: Managed Care, Other (non HMO) | Source: Ambulatory Visit | Attending: Obstetrics and Gynecology | Admitting: Obstetrics and Gynecology

## 2016-10-02 DIAGNOSIS — R1031 Right lower quadrant pain: Secondary | ICD-10-CM

## 2016-10-02 MED ORDER — IOPAMIDOL (ISOVUE-300) INJECTION 61%
125.0000 mL | Freq: Once | INTRAVENOUS | Status: AC | PRN
  Administered 2016-10-02: 125 mL via INTRAVENOUS

## 2016-10-05 ENCOUNTER — Encounter: Payer: Self-pay | Admitting: Family

## 2016-10-05 ENCOUNTER — Other Ambulatory Visit: Payer: Self-pay | Admitting: Family

## 2016-10-05 ENCOUNTER — Ambulatory Visit (INDEPENDENT_AMBULATORY_CARE_PROVIDER_SITE_OTHER)
Admission: RE | Admit: 2016-10-05 | Discharge: 2016-10-05 | Disposition: A | Discharge status: Home / Self Care | Payer: Managed Care, Other (non HMO) | Source: Ambulatory Visit | Attending: Family | Admitting: Family

## 2016-10-05 ENCOUNTER — Ambulatory Visit (INDEPENDENT_AMBULATORY_CARE_PROVIDER_SITE_OTHER): Payer: Managed Care, Other (non HMO) | Admitting: Family

## 2016-10-05 DIAGNOSIS — R1030 Lower abdominal pain, unspecified: Secondary | ICD-10-CM

## 2016-10-05 DIAGNOSIS — K5900 Constipation, unspecified: Secondary | ICD-10-CM

## 2016-10-05 MED ORDER — LUBIPROSTONE 24 MCG PO CAPS: 24.0000 ug | ORAL_CAPSULE | Freq: Two times a day (BID) | ORAL | 0 refills | Status: DC

## 2016-10-05 NOTE — Assessment & Plan Note (Signed)
Lower abdominal pain and generalized abdominal pain with concern for constipation given normal abdominal CT and pelvic ultrasound. No blockages noted on CT scan. Obtain x-ray to check stool regarding or free air. Consider prescription medication as her symptoms appear to be chronic constipation or possible irritable bowel syndrome with constipation. Follow-up and treatment pending x-ray results.

## 2016-10-05 NOTE — Progress Notes (Signed)
Subjective:    Patient ID: Lisa [name], female    DOB: 1961/04/05, 55 y.o.   MRN: [id number]  Chief Complaint  Patient presents with  . Pelvic Pain    CT scan results and what to do from here    HPI:  Lisa [name] is a 55 y.o. female who  has a past medical history of Allergic rhinitis; Asthma; Breast nodule; Chest pain, non-cardiac; Chronic headaches; Cigarette smoker; Complication of anesthesia (05-17-2012); Diverticulosis; GERD (gastroesophageal reflux disease); Hepatic hemangioma; Hypertension; IBS (irritable bowel syndrome); Obesity; Pyloric erosion; and Sleep apnea. and presents today for an office visit.  This is a new problem. Currently experiencing the associated symptom of pain located in the right lower aspect of her pelvis and abdomen. She has been seen by gastroenterology and gynecology. Pain is described as a "cramping or miscarriage pain." Per the patient there was no significant findings on her pelvic ultrasound. A CT scan was also completed which showed mild acute sigmoid diverticulitis without abscess or other complication. Does have nausea on occasion with no vomiting. She has been having constipation for the last 4 days since she was drinking the contrast.  Allergies  Allergen Reactions  . Ceftin [Cefuroxime]     Severe stomach pain - Diverticulitis flare up  . Naproxen     Insomnia  . Hctz [Hydrochlorothiazide]     Causes a headache  . Lisinopril Cough    Cough   . Morphine And Related     Moodiness.  Causes anger.  . Other     [place]             Restlessness, jittery  . Percocet [Oxycodone-Acetaminophen] Other (See Comments)      Outpatient Medications Prior to Visit  Medication Sig Dispense Refill  . albuterol (PROVENTIL HFA;VENTOLIN HFA) 108 (90 Base) MCG/ACT inhaler Inhale 2 puffs into the lungs every 4 (four) hours as needed for wheezing or shortness of breath (cough, shortness of breath or wheezing.). 1 Inhaler 1  . buPROPion (WELLBUTRIN  SR) 150 MG 12 hr tablet Take 1 tablet (150 mg total) by mouth 2 (two) times daily. 180 tablet 2  . carvedilol (COREG) 25 MG tablet Take 1 tablet (25 mg total) by mouth 2 (two) times daily. 180 tablet 3  . diazepam (VALIUM) 5 MG tablet Take 5 mg by mouth every 6 (six) hours as needed for anxiety.    . dicyclomine (BENTYL) 10 MG capsule Take 1 capsule (10 mg total) by mouth 3 (three) times daily as needed for spasms. 90 capsule 11  . fluticasone (FLONASE) 50 MCG/ACT nasal spray Place 2 sprays into both nostrils daily. 16 g 6  . glycopyrrolate (ROBINUL) 2 MG tablet Take 1 tablet (2 mg total) by mouth 2 (two) times daily. 60 tablet 1  . losartan (COZAAR) 100 MG tablet Take 1 tablet (100 mg total) by mouth daily. 90 tablet 3  . omeprazole (PRILOSEC) 40 MG capsule Take 1 capsule (40 mg total) by mouth 2 (two) times daily. 180 capsule 1  . spironolactone (ALDACTONE) 50 MG tablet Take 1 tablet (50 mg total) by mouth daily. 30 tablet 11  . TURMERIC PO Take 1 tablet by mouth. Every 2 days     No facility-administered medications prior to visit.       Past Surgical History:  Procedure Laterality Date  . CARDIAC CATHETERIZATION  12/28/2007   EF 75%. IT REVEALS NORMAL/SUPRANORMAL LEFT VENTRICULAR SYSTOLIC FUNCTION  . CARDIOVASCULAR STRESS TEST  03/25/2007  EF 78%  . CESAREAN SECTION  2003, 2009   x 2  . CHOLECYSTECTOMY  1993  . COLONOSCOPY  2014  . ENDOMETRIAL ABLATION  09/2010   and D&C  . HERNIA REPAIR  [id number]   umbilical  . LAPAROSCOPY    . TUBAL LIGATION    . UMBILICAL HERNIA REPAIR  2004  . US ECHOCARDIOGRAPHY  05/08/2008   EF 55-60%  . VENTRAL HERNIA REPAIR  06/15/2012   Procedure: LAPAROSCOPIC VENTRAL HERNIA;  Surgeon: Edward Jolly, MD;  Location: [facility];  Service: General;  Laterality: N/A;  LAPAROSCOPIC REPAIR VENTRAL HERNIA      Past Medical History:  Diagnosis Date  . Allergic rhinitis   . Asthma   . Breast nodule    right breast, to see dr Helane Rima 06-20-2012 for   . Chest  pain, non-cardiac   . Chronic headaches   . Cigarette smoker   . Complication of anesthesia 05-17-2012   trouble breathing after colonscopy, needed nebulizer  . Diverticulosis   . GERD (gastroesophageal reflux disease)   . Hepatic hemangioma   . Hypertension   . IBS (irritable bowel syndrome)   . Obesity   . Pyloric erosion   . Sleep apnea    CPAP setting varies from 4-10      Review of Systems  Constitutional: Negative for chills and fever.  Respiratory: Negative for chest tightness and shortness of breath.   Cardiovascular: Negative for chest pain, palpitations and leg swelling.  Gastrointestinal: Positive for abdominal pain and constipation. Negative for anal bleeding, blood in stool, diarrhea, nausea and vomiting.      Objective:    BP (!) 154/94 (BP Location: Left Arm, Patient Position: Sitting, Cuff Size: Large)   Pulse 68   Temp 97.6 F (36.4 C) (Oral)   Resp 16   Ht 5' 5.5" (1.664 m)   Wt 282 lb (127.9 kg)   SpO2 97%   BMI 46.21 kg/m  Nursing note and vital signs reviewed.  Physical Exam  Constitutional: She is oriented to person, place, and time. She appears well-developed and well-nourished. No distress.  Cardiovascular: Normal rate, regular rhythm, normal heart sounds and intact distal pulses.   Pulmonary/Chest: Effort normal and breath sounds normal.  Abdominal: She exhibits distension. She exhibits no mass. There is no hepatosplenomegaly. There is tenderness in the right lower quadrant, suprapubic area and left lower quadrant. There is no rigidity, no rebound, no guarding, no tenderness at McBurney's point and negative Murphy's sign.  Neurological: She is alert and oriented to person, place, and time.  Skin: Skin is warm and dry.  Psychiatric: She has a normal mood and affect. Her behavior is normal. Judgment and thought content normal.       Assessment & Plan:   Problem List Items Addressed This Visit      Other   Lower abdominal pain    Lower  abdominal pain and generalized abdominal pain with concern for constipation given normal abdominal CT and pelvic ultrasound. No blockages noted on CT scan. Obtain x-ray to check stool regarding or free air. Consider prescription medication as her symptoms appear to be chronic constipation or possible irritable bowel syndrome with constipation. Follow-up and treatment pending x-ray results.      Relevant Orders   DG Abd 2 Views       I am having [name] maintain her dicyclomine, omeprazole, diazepam, carvedilol, losartan, TURMERIC PO, fluticasone, albuterol, buPROPion, spironolactone, and glycopyrrolate.   Follow-up: Return if symptoms worsen  or fail to improve.  Mauricio Po, FNP

## 2016-10-05 NOTE — Patient Instructions (Addendum)
Thank you for choosing [facility].  SUMMARY AND INSTRUCTIONS:  Medication:  Your prescription(s) have been submitted to your pharmacy or been printed and provided for you. Please take as directed and contact our office if you believe you are having problem(s) with the medication(s) or have any questions.  Follow up:  If your symptoms worsen or fail to improve, please contact our office for further instruction, or in case of emergency go directly to the emergency room at the closest medical facility.    Constipation, Adult Constipation is when a person has fewer than three bowel movements a week, has difficulty having a bowel movement, or has stools that are dry, hard, or larger than normal. As people grow older, constipation is more common. A low-fiber diet, not taking in enough fluids, and taking certain medicines may make constipation worse.  CAUSES   Certain medicines, such as antidepressants, pain medicine, iron supplements, antacids, and water pills.   Certain diseases, such as diabetes, irritable bowel syndrome (IBS), thyroid disease, or depression.   Not drinking enough water.   Not eating enough fiber-rich foods.   Stress or travel.   Lack of physical activity or exercise.   Ignoring the urge to have a bowel movement.   Using laxatives too much.  SIGNS AND SYMPTOMS   Having fewer than three bowel movements a week.   Straining to have a bowel movement.   Having stools that are hard, dry, or larger than normal.   Feeling full or bloated.   Pain in the lower abdomen.   Not feeling relief after having a bowel movement.  DIAGNOSIS  Your health care provider will take a medical history and perform a physical exam. Further testing may be done for severe constipation. Some tests may include:  A barium enema X-ray to examine your rectum, colon, and, sometimes, your small intestine.   A sigmoidoscopy to examine your lower colon.   A  colonoscopy to examine your entire colon. TREATMENT  Treatment will depend on the severity of your constipation and what is causing it. Some dietary treatments include drinking more fluids and eating more fiber-rich foods. Lifestyle treatments may include regular exercise. If these diet and lifestyle recommendations do not help, your health care provider may recommend taking over-the-counter laxative medicines to help you have bowel movements. Prescription medicines may be prescribed if over-the-counter medicines do not work.  HOME CARE INSTRUCTIONS   Eat foods that have a lot of fiber, such as fruits, vegetables, whole grains, and beans.  Limit foods high in fat and processed sugars, such as french fries, hamburgers, cookies, candies, and soda.   A fiber supplement may be added to your diet if you cannot get enough fiber from foods.   Drink enough fluids to keep your urine clear or pale yellow.   Exercise regularly or as directed by your health care provider.   Go to the restroom when you have the urge to go. Do not hold it.   Only take over-the-counter or prescription medicines as directed by your health care provider. Do not take other medicines for constipation without talking to your health care provider first.  [place] IF:   You have bright red blood in your stool.   Your constipation lasts for more than 4 days or gets worse.   You have abdominal or rectal pain.   You have thin, pencil-like stools.   You have unexplained weight loss. MAKE SURE YOU:   Understand these instructions.  Will  watch your condition.  Will get help right away if you are not doing well or get worse.   This information is not intended to replace advice given to you by your health care provider. Make sure you discuss any questions you have with your health care provider.   Document Released: 08/14/2004 Document Revised: 12/07/2014 Document Reviewed: 08/28/2013 Elsevier  Interactive Patient Education [employer].

## 2016-11-02 ENCOUNTER — Other Ambulatory Visit: Payer: Self-pay | Admitting: Gastroenterology

## 2016-11-05 ENCOUNTER — Telehealth: Payer: Self-pay | Admitting: Cardiovascular Disease

## 2016-11-05 NOTE — Telephone Encounter (Signed)
Agree with note from Michelle Swinyer, RN  

## 2016-11-05 NOTE — Telephone Encounter (Signed)
Spoke with patient who states she woke up at 2:30 am today feeling terrible. States she took her BP which was 185/92 mmHg, pulse 72 bpm.  She complains of feeling a vice grip on her head but not tight.  I reviewed her medications with her and she reports she is not taking aldactone.  She cannot remember why she is not taking it.  She reports repeat vital signs @ 0932  BP 195/95 mmHg, and pulse 77 bpm. She complains of some pressure behind her eye and some bright lights at times.  Denies facial drooping, slurred speech or difficulty walking.  She states she just felt terrible this morning.  She states she feels better now and feels like the "vice grip" on her head is looser.  I advised her to start her aldactone today and that if symptoms worsen or if she has reoccurrence of episode like this morning, to seek emergency medical treatment. I scheduled her for bmet on 12/14 for medication therapy.  I advised her to call back next week to report how she is feeling or to call back sooner with questions or concerns.  She verbalized understanding and agreement.

## 2016-11-05 NOTE — Telephone Encounter (Signed)
New Message  Pt c/o BP issue: STAT if pt c/o blurred vision, one-sided weakness or slurred speech  1. What are your last 5 BP readings? 256am-184/92, HR 77; before medication 930am-195/95, HR 72;  2. Are you having any other symptoms (ex. Dizziness, headache, blurred vision, passed out)? Feel light headed  3. What is your BP issue? Pt voiced she thinks her medication has something to do with it or maybe she needs to be on a different medication to control her BP.

## 2016-11-09 NOTE — Telephone Encounter (Signed)
I called patient to follow-up on how she is feeling since starting aldactone.  She reports she is feeling better.  She states she feels a little "woozy" occasionally but the pounding in her head and the difficulty sleeping has resolved.  She is scheduled for BP check and lab work on 12/14.  She thanked me for the call.

## 2016-11-12 ENCOUNTER — Ambulatory Visit (INDEPENDENT_AMBULATORY_CARE_PROVIDER_SITE_OTHER): Payer: Managed Care, Other (non HMO) | Admitting: Nurse Practitioner

## 2016-11-12 ENCOUNTER — Other Ambulatory Visit: Payer: Managed Care, Other (non HMO) | Admitting: *Deleted

## 2016-11-12 ENCOUNTER — Telehealth: Payer: Self-pay

## 2016-11-12 VITALS — BP 150/90 | HR 76 | Resp 18

## 2016-11-12 DIAGNOSIS — I10 Essential (primary) hypertension: Secondary | ICD-10-CM

## 2016-11-12 LAB — BASIC METABOLIC PANEL
BUN: 20 mg/dL (ref 7–25)
CALCIUM: 9.8 mg/dL (ref 8.6–10.4)
CO2: 28 mmol/L (ref 20–31)
Chloride: 103 mmol/L (ref 98–110)
Creat: 1.08 mg/dL — ABNORMAL HIGH (ref 0.50–1.05)
GLUCOSE: 126 mg/dL — AB (ref 65–99)
Potassium: 4.2 mmol/L (ref 3.5–5.3)
Sodium: 138 mmol/L (ref 135–146)

## 2016-11-12 MED ORDER — DOXAZOSIN MESYLATE ER 4 MG PO TB24: 4.0000 mg | ORAL_TABLET | Freq: Every day | ORAL | 11 refills | Status: DC

## 2016-11-12 NOTE — Telephone Encounter (Signed)
Prior auth for Cardura XL 4mg  obtained from [facility]. Valid through 11/17/2018. Pharmacy notified.

## 2016-11-12 NOTE — Progress Notes (Signed)
1.) Reason for visit: Recheck BP since starting aldactone 25 mg 1 week ago  2.) Name of MD requesting visit: Dr. Acie Fredrickson  3.) H&P: Hx essential hypertension; patient advised to start aldactone 25 mg once daily at office visit on 10/24.  She reported when she called me on 12/7 that she had not started the medication.  She was having headaches, visual changes, and BP was elevated causing her not to sleep well.  I advised her at that time to start the aldactone and to come in today for bmet and BP check.    4.) ROS related to problem: She reports she has been feeling better.  BP remains elevated at 150/90 mmHg, pulse is regular at 76 bpm  5.) Assessment and plan per MD: Start Cardura 4 mg at bedtime.  I advised her to call back with questions or concerns prior to 6 mo follow-up.

## 2016-11-12 NOTE — Patient Instructions (Signed)
Medication Instructions:  START Cardura (Doxazosin) 4 mg at bedtime   Labwork: TODAY - I will call with your results   Testing/Procedures: None Ordered   Follow-Up: Your physician wants you to follow-up in: 6 months with Dr. Acie Fredrickson.  You will receive a reminder letter in the mail two months in advance. If you don't receive a letter, please call our office to schedule the follow-up appointment.   If you need a refill on your cardiac medications before your next appointment, please call your pharmacy.   Thank you for choosing [facility]! Christen Bame, RN [phone]

## 2016-11-17 ENCOUNTER — Other Ambulatory Visit: Payer: Self-pay | Admitting: Gastroenterology

## 2016-12-08 ENCOUNTER — Other Ambulatory Visit: Payer: Self-pay | Admitting: *Deleted

## 2016-12-08 MED ORDER — SPIRONOLACTONE 50 MG PO TABS: 50.0000 mg | ORAL_TABLET | Freq: Every day | ORAL | 2 refills | Status: DC

## 2016-12-14 ENCOUNTER — Telehealth: Payer: Self-pay | Admitting: Cardiovascular Disease

## 2016-12-14 MED ORDER — DOXAZOSIN MESYLATE ER 4 MG PO TB24: 4.0000 mg | ORAL_TABLET | Freq: Every day | ORAL | 6 refills | Status: DC

## 2016-12-14 MED ORDER — DOXAZOSIN MESYLATE ER 4 MG PO TB24: 4.0000 mg | ORAL_TABLET | Freq: Every day | ORAL | 3 refills | Status: DC

## 2016-12-14 NOTE — Telephone Encounter (Signed)
Pt calling regarding [facility] not having Cardura either-pls call

## 2016-12-14 NOTE — Telephone Encounter (Signed)
Returned call to patient.[facility] stated [facility] [place] [facility] and was told manufacturer out of.Stated she took her last one last night and she will need.Spoke to pharmacist at [place] and they have Cardura.90 day prescription for Cardura sent to them.

## 2016-12-14 NOTE — Telephone Encounter (Signed)
I was not aware that there was a CArdura XL She is on 4 mg a day She may have the regular Cardura 2 mg BID

## 2016-12-14 NOTE — Telephone Encounter (Signed)
New message      Pt c/o medication issue:  1. Name of Medication:  cardura 2. How are you currently taking this medication (dosage and times per day)?  4mg  3. Are you having a reaction (difficulty breathing--STAT)? no 4. What is your medication issue?  Pharmacy is out of medication. Manufacture back order indefinitely.  Pt took last pill saturday. Is there something else the doctor can call in?

## 2016-12-14 NOTE — Telephone Encounter (Signed)
Returned call to [facility].Stated they have the plain Cardura.Advised I will send message to Dr.Nahser and ask him if ok to take plain Cardura.

## 2016-12-24 ENCOUNTER — Ambulatory Visit (INDEPENDENT_AMBULATORY_CARE_PROVIDER_SITE_OTHER)
Admission: RE | Admit: 2016-12-24 | Discharge: 2016-12-24 | Disposition: A | Discharge status: Home / Self Care | Payer: Managed Care, Other (non HMO) | Source: Ambulatory Visit | Attending: Nurse Practitioner | Admitting: Nurse Practitioner

## 2016-12-24 ENCOUNTER — Other Ambulatory Visit (INDEPENDENT_AMBULATORY_CARE_PROVIDER_SITE_OTHER): Payer: Managed Care, Other (non HMO)

## 2016-12-24 ENCOUNTER — Encounter: Payer: Self-pay | Admitting: Nurse Practitioner

## 2016-12-24 ENCOUNTER — Ambulatory Visit (INDEPENDENT_AMBULATORY_CARE_PROVIDER_SITE_OTHER): Payer: Managed Care, Other (non HMO) | Admitting: Nurse Practitioner

## 2016-12-24 ENCOUNTER — Telehealth: Payer: Self-pay | Admitting: Cardiovascular Disease

## 2016-12-24 VITALS — BP 152/92 | HR 76 | Temp 98.3°F | Resp 18 | Ht 65.5 in | Wt 286.1 lb

## 2016-12-24 DIAGNOSIS — M79605 Pain in left leg: Secondary | ICD-10-CM

## 2016-12-24 DIAGNOSIS — I739 Peripheral vascular disease, unspecified: Secondary | ICD-10-CM

## 2016-12-24 DIAGNOSIS — M79604 Pain in right leg: Secondary | ICD-10-CM

## 2016-12-24 LAB — BASIC METABOLIC PANEL
BUN: 19 mg/dL (ref 6–23)
CALCIUM: 9.9 mg/dL (ref 8.4–10.5)
CHLORIDE: 107 meq/L (ref 96–112)
CO2: 25 mEq/L (ref 19–32)
CREATININE: 0.89 mg/dL (ref 0.40–1.20)
GFR: 85.23 mL/min (ref >60.00)
Glucose, Bld: 119 mg/dL — ABNORMAL HIGH (ref 70–99)
Potassium: 4 mEq/L (ref 3.5–5.1)
Sodium: 140 mEq/L (ref 135–145)

## 2016-12-24 LAB — MAGNESIUM: MAGNESIUM: 2 mg/dL (ref 1.5–2.5)

## 2016-12-24 MED ORDER — DOXAZOSIN MESYLATE 2 MG PO TABS: 2.0000 mg | ORAL_TABLET | Freq: Two times a day (BID) | ORAL | 3 refills | Status: DC

## 2016-12-24 NOTE — Telephone Encounter (Signed)
F/u Message  Pt call requesting to speak with RN. Pt states she never received a call back about her medication issue on 1/15. Please call back to discuss

## 2016-12-24 NOTE — Telephone Encounter (Signed)
Spoke with patient who states she is following up from call about medication on 1/15.  I apologized for not calling her back and advised that I thought the issue had been resolved.  She was understanding.  [facility] [address] [facility]. I advised I will call to verify that they have the doxazosin 2 mg tablets and will send the Rx. I advised I will call her back if that pharmacy does not have those tablets available.  She verbalized understanding and agreement and thanked me for the call.

## 2016-12-24 NOTE — Progress Notes (Signed)
Subjective:  Patient ID: Lisa [name], female    DOB: Oct 11, 1961  Age: 56 y.o. MRN: [id number]  CC: Leg Pain (bilateral leg pain, has had constant pain everyday since december 1st)   Leg Pain   The incident occurred more than 1 week ago. There was no injury mechanism. The pain is present in the left leg, left hip, left thigh, left knee, right leg, right hip, right thigh and right knee. The quality of the pain is described as aching and cramping. The pain is at a severity of 10/10. The pain is severe. The pain has been fluctuating since onset. Pertinent negatives include no inability to bear weight, loss of motion, loss of sensation, muscle weakness, numbness or tingling. She reports no foreign bodies present. The symptoms are aggravated by movement. She has tried rest for the symptoms. The treatment provided significant relief.    Outpatient Medications Prior to Visit  Medication Sig Dispense Refill  . albuterol (PROVENTIL HFA;VENTOLIN HFA) 108 (90 Base) MCG/ACT inhaler Inhale 2 puffs into the lungs every 4 (four) hours as needed for wheezing or shortness of breath (cough, shortness of breath or wheezing.). 1 Inhaler 1  . buPROPion (WELLBUTRIN SR) 150 MG 12 hr tablet Take 1 tablet (150 mg total) by mouth 2 (two) times daily. 180 tablet 2  . carvedilol (COREG) 25 MG tablet Take 1 tablet (25 mg total) by mouth 2 (two) times daily. 180 tablet 3  . diazepam (VALIUM) 5 MG tablet Take 5 mg by mouth every 6 (six) hours as needed for anxiety.    . dicyclomine (BENTYL) 10 MG capsule Take 1 capsule (10 mg total) by mouth 3 (three) times daily as needed for spasms. 90 capsule 11  . fluticasone (FLONASE) 50 MCG/ACT nasal spray Place 2 sprays into both nostrils daily. 16 g 6  . losartan (COZAAR) 100 MG tablet Take 1 tablet (100 mg total) by mouth daily. 90 tablet 3  . lubiprostone (AMITIZA) 24 MCG capsule Take 1 capsule (24 mcg total) by mouth 2 (two) times daily with a meal. 60 capsule 0  . omeprazole  (PRILOSEC) 40 MG capsule TAKE 1 CAPSULE (40 MG TOTAL) BY MOUTH 2 (TWO) TIMES DAILY. 180 capsule 1  . spironolactone (ALDACTONE) 50 MG tablet Take 1 tablet (50 mg total) by mouth daily. 90 tablet 2  . TURMERIC PO Take 1 tablet by mouth. Every 2 days    . doxazosin (CARDURA XL) 4 MG 24 hr tablet Take 1 tablet (4 mg total) by mouth daily with breakfast. 90 tablet 3  . glycopyrrolate (ROBINUL) 2 MG tablet TAKE 1 TABLET (2 MG TOTAL) BY MOUTH 2 (TWO) TIMES DAILY. 60 tablet 1   No facility-administered medications prior to visit.     ROS See HPI  Objective:  BP (!) 152/92 (BP Location: Left Arm, Patient Position: Sitting, Cuff Size: Large)   Pulse 76   Temp 98.3 F (36.8 C) (Oral)   Resp 18   Ht 5' 5.5" (1.664 m)   Wt 286 lb 1.9 oz (129.8 kg)   SpO2 95%   BMI 46.89 kg/m   BP Readings from Last 3 Encounters:  12/24/16 (!) 152/92  11/12/16 (!) 150/90  10/05/16 (!) 154/94    Wt Readings from Last 3 Encounters:  12/24/16 286 lb 1.9 oz (129.8 kg)  10/05/16 282 lb (127.9 kg)  09/22/16 281 lb 6.4 oz (127.6 kg)    Physical Exam  Constitutional: She is oriented to person, place, and time.  Cardiovascular:  Normal rate.   Diminished LE distal pulses. Bilateral edema, non pitting, L>R (chronic per patient)  Pulmonary/Chest: Effort normal.  Abdominal: Soft. Bowel sounds are normal.  Musculoskeletal: Normal range of motion. She exhibits edema.  Neurological: She is alert and oriented to person, place, and time. Coordination normal.  Skin: Skin is warm and dry.    Lab Results  Component Value Date   WBC 8.4 05/26/2016   HGB 15.3 (H) 05/26/2016   HCT 45.5 05/26/2016   PLT 309.0 05/26/2016   GLUCOSE 119 (H) 12/24/2016   CHOL 215 (H) 05/26/2016   TRIG 130.0 05/26/2016   HDL 34.50 (L) 05/26/2016   LDLCALC 155 (H) 05/26/2016   ALT 23 05/26/2016   AST 13 05/26/2016   NA 140 12/24/2016   K 4.0 12/24/2016   CL 107 12/24/2016   CREATININE 0.89 12/24/2016   BUN 19 12/24/2016   CO2  25 12/24/2016   TSH 1.70 02/13/2014   INR 0.9 12/27/2007   HGBA1C 6.2 02/13/2014    Dg Abd 2 Views  Result Date: 10/05/2016 CLINICAL DATA:  Abdominal pain with nausea and constipation EXAM: ABDOMEN - 2 VIEW COMPARISON:  Apr 10, 2016 FINDINGS: Supine and upright images obtained. Postoperative changes noted in the right upper quadrant in the pelvis. There are contrast filled diverticula in the colon. There is diffuse stool throughout the colon. There is no bowel dilatation or air-fluid level suggesting bowel obstruction. No free air. IMPRESSION: Diffuse stool throughout colon. Scattered contrast filled diverticula. No bowel obstruction or free air. Extensive postoperative change noted. Electronically Signed   By: Lowella Grip III M.D.   On: 10/05/2016 13:19    Assessment & Plan:   Lisa [name] was seen today for leg pain.  Diagnoses and all orders for this visit:  Leg pain, bilateral -     Magnesium; Future -     Basic metabolic panel; Future -     VAS [name] ABI WITH/WO TBI; Future -     DG Lumbar Spine 2-3 Views; Future  Claudication of both lower extremities (HCC) -     Magnesium; Future -     Basic metabolic panel; Future -     VAS [name] ABI WITH/WO TBI; Future -     DG Lumbar Spine 2-3 Views; Future   I have discontinued Lisa [name]'s glycopyrrolate. I am also having her maintain her dicyclomine, diazepam, carvedilol, losartan, TURMERIC PO, fluticasone, albuterol, buPROPion, lubiprostone, omeprazole, and spironolactone.  No orders of the defined types were placed in this encounter.   Follow-up: No Follow-up on file.  Wilfred Lacy, NP

## 2016-12-24 NOTE — Patient Instructions (Signed)

## 2016-12-24 NOTE — Progress Notes (Signed)
Normal results, see office note

## 2017-01-14 ENCOUNTER — Encounter: Payer: Self-pay | Admitting: Emergency Medicine

## 2017-01-14 ENCOUNTER — Other Ambulatory Visit: Payer: Self-pay | Admitting: Nurse Practitioner

## 2017-01-14 ENCOUNTER — Ambulatory Visit (INDEPENDENT_AMBULATORY_CARE_PROVIDER_SITE_OTHER): Payer: Managed Care, Other (non HMO) | Admitting: Emergency Medicine

## 2017-01-14 VITALS — BP 144/82 | HR 80 | Ht 65.0 in | Wt 289.0 lb

## 2017-01-14 DIAGNOSIS — R911 Solitary pulmonary nodule: Secondary | ICD-10-CM

## 2017-01-14 DIAGNOSIS — J84115 Respiratory bronchiolitis interstitial lung disease: Secondary | ICD-10-CM | POA: Insufficient documentation

## 2017-01-14 DIAGNOSIS — J449 Chronic obstructive pulmonary disease, unspecified: Secondary | ICD-10-CM | POA: Diagnosis not present

## 2017-01-14 DIAGNOSIS — G4730 Sleep apnea, unspecified: Secondary | ICD-10-CM | POA: Diagnosis not present

## 2017-01-14 DIAGNOSIS — F17200 Nicotine dependence, unspecified, uncomplicated: Secondary | ICD-10-CM

## 2017-01-14 DIAGNOSIS — I739 Peripheral vascular disease, unspecified: Secondary | ICD-10-CM

## 2017-01-14 NOTE — Assessment & Plan Note (Signed)
Continue CPAP. She is benefiting significantly. Less daytime sleepiness. More energy.

## 2017-01-14 NOTE — Progress Notes (Signed)
Subjective:    Patient ID: Lisa [name], female    DOB: 1961/03/15, 56 y.o.   MRN: [id number]  Shortness of Breath  Pertinent negatives include no ear pain, fever, headaches, leg swelling, rash, rhinorrhea, sore throat, vomiting or wheezing.   ROV 12/17/15 -- hx of multifactorial dyspnea, tobacco abuse, obstructive sleep apnea on CPAP. 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  She tells me that she has increased her smoking to 2 pks a day. She doesn't have a primary MD, does follow with Dr Acie Fredrickson. She describes sx of depression. She had trouble when she took Zoloft, more depression. She tells me that her breathing has been stable. She denies any increase in wheeze or mucous. Not currently taking BD's.   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 This showed that her nodule is unchanged and based on usual criteria is benign. She had some improvement in her inflammatory changes although she does still have some infiltrate consistent with RB-ILD. She was able to decrease her cigarettes significantly for a period of time since our last visit. Currently smoking 15 cigarettes a day. She stopped wellbutrin about 3 weeks ago. She is having continued cough. She has some atypical chest discomfort - mid to left chest, bothers her after eating, sometimes w exertion. Sharp pain, has sometimes gone to her L arm. She is followed by Dr Acie Fredrickson and has had a reassuring eval thus far. She indicates that she is benefiting from CPAP.   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 She has a 5.77mm RML nodule, most recent CT chest 06/2016. She had stopped smoking in 1-/17 to 12/17, currently using about 1/2 pack a day. She  is on wellbutrin. She is very reliable with her CPAP, benefits from it. Feels that her energy is better when she uses it. She is having ABI tomorrow for LE pain. She uses albuterol rarely.     Review of Systems  Constitutional: Negative for fever and unexpected weight change.  HENT: Negative for congestion, dental problem, ear pain, nosebleeds, postnasal drip, rhinorrhea, sinus pressure, sneezing, sore throat and trouble swallowing.   Eyes: Negative for redness and itching.  Respiratory: Positive for cough. Negative for chest tightness, shortness of breath and wheezing.   Cardiovascular: Negative for palpitations and leg swelling.  Gastrointestinal: Negative for nausea and vomiting.  Genitourinary: Negative for dysuria.  Musculoskeletal: Negative for joint swelling.  Skin: Negative for rash.  Neurological: Negative for headaches.  Hematological: Does not bruise/bleed easily.  Psychiatric/Behavioral: Negative for dysphoric mood. The patient is not nervous/anxious.         Objective:   Physical Exam Vitals:   01/14/17 1103  BP: (!) 144/82  BP Location: Left Arm  Cuff Size: Normal  Pulse: 80  SpO2: 95%  Weight: 289 lb (131.1 kg)  Height: 5\' 5"  (1.651 m)   Gen: Pleasant, obese, in no distress,  normal affect  ENT: No lesions,  mouth clear, mild L ptosis  Neck: No JVD, no TMG, no carotid bruits  Lungs: No use of accessory muscles, clear without rales or rhonchi  Cardiovascular: RRR, heart sounds normal, no murmur or gallops, trace  peripheral edema  Musculoskeletal: No deformities, no cyanosis or clubbing  Neuro: alert, non focal  Skin: Warm, no lesions or rashes  07/06/16 --  COMPARISON:  07/26/2015, 01/24/2015, and 09/25/2008  FINDINGS: Cardiovascular: Stable mild cardiomegaly. Aortic atherosclerosis noted.  Mediastinum/Nodes: No masses or pathologically enlarged lymph nodes identified on this un-enhanced exam. Sub-cm mediastinal lymph nodes in the right  paratracheal region are stable.  Lungs/Pleura: Diffuse mosaic attenuation again seen throughout both lungs, without significant change. No evidence of pulmonary consolidation or pleural effusion.  Previously seen small ground-glass opacity in the peripheral left upper lobe has resolved. 5 mm right middle lobe pulmonary nodule on image 89/3 remains stable.  Upper Abdomen: No acute findings.  Musculoskeletal: No chest wall mass or suspicious bone lesions identified.  IMPRESSION: Stable 5 mm right middle lobe pulmonary nodule, consistent with benign etiology. No further imaging followup required. This follows the consensus statement: Guidelines for Management of Small Pulmonary Nodules Detected on CT Images: From the Fleischner Society 2017; published online before print (10.1148/radiol.SG:5268862).  Interval resolution of small left upper lobe ground-glass opacity.  Stable mosaic attenuation throughout both lungs, with differential diagnosis including small airways disease, respiratory bronchiolitis, and hypersensitivity pneumonitis.     Assessment & Plan:  COPD (chronic obstructive pulmonary disease) (HCC) Mild obstruction / mixed disease. Discussed the importance of smoking cessation today. Do not believe that she needs scheduled BD for now. Continue albuterol prn.   Sleep apnea Continue CPAP. She is benefiting significantly. Less daytime sleepiness. More energy.   Pulmonary nodule, right Repeat Ct chest 06/2017 to follow nodule as well as bilateral infiltrates.   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 We have not biopsied her. The infiltrates have persisted but she has been smoking all along. Will consider whether she needs a bx as we go forward.   Tobacco use disorder Discussed cessation w her. We  set a goal to get down to 5 cig a day by next time. She will continue wellbutrin.   Baltazar Apo, MD, PhD 01/14/2017, 11:43 AM [facility] [phone] or if no answer [phone]

## 2017-01-14 NOTE — Patient Instructions (Signed)
Please continue to work on stopping smoking. It is very important for you to do this.  We will repeat your Ct scan of the chest in August 2018. This is to follow your pulmonary nodule, but also to track changes in your lungs consistent with RB-ILD.  Take albuterol 2 puffs up to every 4 hours if needed for shortness of breath.  We will not start a scheduled every day inhaler at this time, but it may be necessary in the future.  Continue your wellbutrin.  Continue your CPAP every night Follow in 6 months to review your symptoms and your CT scan of the chest.

## 2017-01-14 NOTE — Assessment & Plan Note (Signed)
Repeat Ct chest 06/2017 to follow nodule as well as bilateral infiltrates.

## 2017-01-14 NOTE — Assessment & Plan Note (Signed)
She has a bilateral pneumonitis pattern on CT scan of the chest that could suggest air trapping but I think it's more consistent with RB-ILD. We have not biopsied her. The infiltrates have persisted but she has been smoking all along. Will consider whether she needs a bx as we go forward.

## 2017-01-14 NOTE — Assessment & Plan Note (Signed)
Mild obstruction / mixed disease. Discussed the importance of smoking cessation today. Do not believe that she needs scheduled BD for now. Continue albuterol prn.

## 2017-01-14 NOTE — Assessment & Plan Note (Signed)
Discussed cessation w her. We set a goal to get down to 5 cig a day by next time. She will continue wellbutrin.

## 2017-01-15 ENCOUNTER — Ambulatory Visit (HOSPITAL_COMMUNITY)
Admission: RE | Admit: 2017-01-15 | Discharge: 2017-01-15 | Disposition: A | Discharge status: Home / Self Care | Payer: Managed Care, Other (non HMO) | Source: Ambulatory Visit | Attending: Cardiology | Admitting: Cardiology

## 2017-01-15 DIAGNOSIS — J449 Chronic obstructive pulmonary disease, unspecified: Secondary | ICD-10-CM | POA: Insufficient documentation

## 2017-01-15 DIAGNOSIS — I10 Essential (primary) hypertension: Secondary | ICD-10-CM | POA: Insufficient documentation

## 2017-01-15 DIAGNOSIS — I70293 Other atherosclerosis of native arteries of extremities, bilateral legs: Secondary | ICD-10-CM | POA: Diagnosis not present

## 2017-01-15 DIAGNOSIS — Z720 Tobacco use: Secondary | ICD-10-CM | POA: Diagnosis not present

## 2017-01-15 DIAGNOSIS — I2510 Atherosclerotic heart disease of native coronary artery without angina pectoris: Secondary | ICD-10-CM | POA: Insufficient documentation

## 2017-01-15 DIAGNOSIS — I739 Peripheral vascular disease, unspecified: Secondary | ICD-10-CM

## 2017-01-15 DIAGNOSIS — M79605 Pain in left leg: Secondary | ICD-10-CM | POA: Diagnosis present

## 2017-01-15 DIAGNOSIS — M79604 Pain in right leg: Secondary | ICD-10-CM | POA: Diagnosis present

## 2017-01-16 ENCOUNTER — Other Ambulatory Visit: Payer: Self-pay | Admitting: Nurse Practitioner

## 2017-01-16 DIAGNOSIS — I739 Peripheral vascular disease, unspecified: Secondary | ICD-10-CM

## 2017-01-19 ENCOUNTER — Other Ambulatory Visit: Payer: Self-pay | Admitting: Nurse Practitioner

## 2017-01-19 ENCOUNTER — Other Ambulatory Visit: Payer: Self-pay

## 2017-01-19 ENCOUNTER — Encounter: Payer: Self-pay | Admitting: Nurse Practitioner

## 2017-01-19 DIAGNOSIS — I739 Peripheral vascular disease, unspecified: Secondary | ICD-10-CM

## 2017-01-19 DIAGNOSIS — F17200 Nicotine dependence, unspecified, uncomplicated: Secondary | ICD-10-CM

## 2017-01-19 MED ORDER — ASPIRIN EC 325 MG PO TBEC: 325.0000 mg | DELAYED_RELEASE_TABLET | Freq: Every day | ORAL | 0 refills | Status: DC

## 2017-01-19 MED ORDER — ATORVASTATIN CALCIUM 40 MG PO TABS: 40.0000 mg | ORAL_TABLET | Freq: Every day | ORAL | 3 refills | Status: DC

## 2017-01-19 NOTE — Progress Notes (Signed)
ABI is positive for arterial vascular disease, therefore she will need to see vascular suregeon, as well as start cholesterol lowering agent, aspirin and need to stop tobacco. Prescription for atrovastatin sent. She needs to get aspirin 81mg  once a day OTC. Follow up in 22months (fasting)   Lipid Panel     Component Value Date/Time   CHOL 215 (H) 05/26/2016 1134   TRIG 130.0 05/26/2016 1134   HDL 34.50 (L) 05/26/2016 1134   CHOLHDL 6 05/26/2016 1134   VLDL 26.0 05/26/2016 1134   LDLCALC 155 (H) 05/26/2016 1134   CMP     Component Value Date/Time   NA 140 12/24/2016 1341   K 4.0 12/24/2016 1341   CL 107 12/24/2016 1341   CO2 25 12/24/2016 1341   GLUCOSE 119 (H) 12/24/2016 1341   BUN 19 12/24/2016 1341   CREATININE 0.89 12/24/2016 1341   CREATININE 1.08 (H) 11/12/2016 1040   CALCIUM 9.9 12/24/2016 1341   PROT 8.0 05/26/2016 1134   ALBUMIN 4.1 05/26/2016 1134   AST 13 05/26/2016 1134   ALT 23 05/26/2016 1134   ALKPHOS 97 05/26/2016 1134   BILITOT 0.5 05/26/2016 1134   GFRNONAA 77 (L) 01/01/2015 1103   GFRAA 89 (L) 01/01/2015 1103   CBC    Component Value Date/Time   WBC 8.4 05/26/2016 1134   RBC 5.22 (H) 05/26/2016 1134   HGB 15.3 (H) 05/26/2016 1134   HCT 45.5 05/26/2016 1134   PLT 309.0 05/26/2016 1134   MCV 87.3 05/26/2016 1134   MCH 30.1 01/01/2015 1103   MCHC 33.7 05/26/2016 1134   RDW 14.8 05/26/2016 1134   LYMPHSABS 2.2 02/28/2016 1414   MONOABS 0.6 02/28/2016 1414   EOSABS 0.2 02/28/2016 1414   BASOSABS 0.0 02/28/2016 1414

## 2017-01-20 ENCOUNTER — Telehealth: Payer: Self-pay | Admitting: Gastroenterology

## 2017-01-20 NOTE — Telephone Encounter (Signed)
Is there any reason she can't take ASA 325 mg daily?

## 2017-01-21 NOTE — Telephone Encounter (Signed)
No GI reasons why ASA should be avoided.

## 2017-01-21 NOTE — Telephone Encounter (Signed)
Left message for patient to call back  

## 2017-01-22 NOTE — Telephone Encounter (Signed)
I left the patient a detailed message and asked that she call back if she has additional questions.

## 2017-01-25 ENCOUNTER — Telehealth: Payer: Self-pay | Admitting: Gastroenterology

## 2017-01-25 NOTE — Telephone Encounter (Signed)
Patient notified ok to take ASA.  See phone note from 01/22/17.

## 2017-02-04 ENCOUNTER — Other Ambulatory Visit: Payer: Self-pay | Admitting: Gastroenterology

## 2017-02-09 ENCOUNTER — Telehealth: Payer: Self-pay | Admitting: Nurse Practitioner

## 2017-02-09 NOTE — Telephone Encounter (Signed)
Left vm for pt call back, need to know is she is okey with the MRI to rule out neurogenic claudication for her pain? Need to schedule patient 1 month follow up with Marya Amsler as well.

## 2017-02-09 NOTE — Telephone Encounter (Signed)
Spoke with Baldo Ash, do not see anything mention about medication taht Dr. Eliot Ford gave her. recommend pt to call his office to find out. Left detail massage inform pt.

## 2017-02-09 NOTE — Telephone Encounter (Signed)
Lisa [name] was evaluated on 12/24/16 for cause of bilateral leg pain. Based on ABI; she was diagnosed with severe PAD and referred to vascular surgeon (scheduled to be seen 03/11/17). Medication recommendations were also made at that time.  Somehow she was also scheduled and evaluated [facility] specialist: Dr. Patrecia Pour. He recommended further diagnostic imaging to rule out Neurogenic Claudication secondary to lumbar spinal stenosis which can also cause some of her symptoms (pain with standing). Based on Dr.featherston's recommendation, an MRI of Lumbar spine is needed. Please ask patient if she wants to proceed with this or not. If so, please let me know. She also needs to follow up with Terri Piedra who is her pcp in 20month.

## 2017-02-18 ENCOUNTER — Ambulatory Visit: Payer: Managed Care, Other (non HMO) | Admitting: Nurse Practitioner

## 2017-02-18 ENCOUNTER — Telehealth: Payer: Self-pay | Admitting: Gastroenterology

## 2017-02-18 ENCOUNTER — Encounter (HOSPITAL_COMMUNITY): Payer: Self-pay

## 2017-02-18 ENCOUNTER — Emergency Department (HOSPITAL_COMMUNITY): Payer: Managed Care, Other (non HMO)

## 2017-02-18 ENCOUNTER — Telehealth: Payer: Self-pay | Admitting: Cardiovascular Disease

## 2017-02-18 ENCOUNTER — Emergency Department (HOSPITAL_COMMUNITY)
Admission: EM | Admit: 2017-02-18 | Discharge: 2017-02-18 | Disposition: A | Discharge status: Home / Self Care | Payer: Managed Care, Other (non HMO) | Attending: Emergency Medicine | Admitting: Emergency Medicine

## 2017-02-18 DIAGNOSIS — J449 Chronic obstructive pulmonary disease, unspecified: Secondary | ICD-10-CM | POA: Diagnosis not present

## 2017-02-18 DIAGNOSIS — I152 Hypertension secondary to endocrine disorders: Secondary | ICD-10-CM | POA: Diagnosis present

## 2017-02-18 DIAGNOSIS — E1159 Type 2 diabetes mellitus with other circulatory complications: Secondary | ICD-10-CM | POA: Diagnosis present

## 2017-02-18 DIAGNOSIS — Z7982 Long term (current) use of aspirin: Secondary | ICD-10-CM | POA: Insufficient documentation

## 2017-02-18 DIAGNOSIS — I10 Essential (primary) hypertension: Secondary | ICD-10-CM | POA: Diagnosis not present

## 2017-02-18 DIAGNOSIS — Z79899 Other long term (current) drug therapy: Secondary | ICD-10-CM | POA: Insufficient documentation

## 2017-02-18 DIAGNOSIS — F17200 Nicotine dependence, unspecified, uncomplicated: Secondary | ICD-10-CM | POA: Diagnosis present

## 2017-02-18 DIAGNOSIS — F17210 Nicotine dependence, cigarettes, uncomplicated: Secondary | ICD-10-CM | POA: Insufficient documentation

## 2017-02-18 DIAGNOSIS — K219 Gastro-esophageal reflux disease without esophagitis: Secondary | ICD-10-CM | POA: Diagnosis present

## 2017-02-18 DIAGNOSIS — R0789 Other chest pain: Secondary | ICD-10-CM | POA: Diagnosis not present

## 2017-02-18 DIAGNOSIS — R079 Chest pain, unspecified: Secondary | ICD-10-CM | POA: Diagnosis present

## 2017-02-18 LAB — CBC
HCT: 45.6 % (ref 36.0–46.0)
Hemoglobin: 15.6 g/dL — ABNORMAL HIGH (ref 12.0–15.0)
MCH: 30.7 pg (ref 26.0–34.0)
MCHC: 34.2 g/dL (ref 30.0–36.0)
MCV: 89.8 fL (ref 78.0–100.0)
Platelets: 252 10*3/uL (ref 150–400)
RBC: 5.08 MIL/uL (ref 3.87–5.11)
RDW: 13.6 % (ref 11.5–15.5)
WBC: 8.1 10*3/uL (ref 4.0–10.5)

## 2017-02-18 LAB — BASIC METABOLIC PANEL
ANION GAP: 11 (ref 5–15)
BUN: 16 mg/dL (ref 6–20)
CALCIUM: 10.5 mg/dL — AB (ref 8.9–10.3)
CO2: 25 mmol/L (ref 22–32)
CREATININE: 0.98 mg/dL (ref 0.44–1.00)
Chloride: 104 mmol/L (ref 101–111)
GFR calc Af Amer: >60 mL/min (ref >60)
GFR calc non Af Amer: >60 mL/min (ref >60)
GLUCOSE: 126 mg/dL — AB (ref 65–99)
Potassium: 4 mmol/L (ref 3.5–5.1)
Sodium: 140 mmol/L (ref 135–145)

## 2017-02-18 LAB — I-STAT TROPONIN, ED: TROPONIN I, POC: 0 ng/mL (ref 0.00–0.08)

## 2017-02-18 MED ORDER — KETOROLAC TROMETHAMINE 30 MG/ML IJ SOLN: 30.0000 mg | Freq: Once | INTRAMUSCULAR | Status: DC

## 2017-02-18 MED ORDER — GI COCKTAIL ~~LOC~~
30.0000 mL | Freq: Once | ORAL | Status: AC
  Administered 2017-02-18: 30 mL via ORAL
  Filled 2017-02-18: qty 30

## 2017-02-18 MED ORDER — KETOROLAC TROMETHAMINE 30 MG/ML IJ SOLN
30.0000 mg | Freq: Once | INTRAMUSCULAR | Status: AC
Start: 2017-02-18 — End: 2017-02-18
  Administered 2017-02-18: 30 mg via INTRAMUSCULAR
  Filled 2017-02-18: qty 1

## 2017-02-18 NOTE — Telephone Encounter (Signed)
Agree with plan for patient to go ER

## 2017-02-18 NOTE — ED Provider Notes (Signed)
[facility] DEPT Provider Note   CSN: [id number] Arrival date & time: 02/18/17  1148     History   Chief Complaint Chief Complaint  Patient presents with  . Chest Pain    HPI Lisa [name] is a 56 y.o. female.  HPI   Patient is a 56 year old female with history of obesity, hypertension, reflux, asthma, interstitial lung disease, PVD who presents the ED with complaint of chest pain. Patient reports 3 days ago while she was sitting at her desk at work she began having constant midsternal aching chest pain that she states radiates down the middle of her chest and intermittently into her epigastric region. Patient reports the pain felt similar to episodes of reflux she has had in the past. Denies any aggravating or alleviating factors. Patient reports taking Tums and omeprazole at home without relief which she states typically helps her reflux symptoms. Patient reports having intermittent shortness of breath with her chest pain. She also reports having intermittent lightheadedness which she states is chronic and unchanged. Denies fever, headache, cough, palpitations, wheezing, abdominal pain, nausea, vomiting, diarrhea, numbness, tingling, weakness. Patient endorses chronic swelling to bilateral legs and states her left typically slightly larger than right, reports leg swelling today is unchanged. Patient endorses smoking a proximally 15 cigarettes daily. Denies personal or family history of cardiac disease.  PCPElna Breslow  Past Medical History:  Diagnosis Date  . Allergic rhinitis   . Asthma   . Breast nodule    right breast, to see dr Helane Rima 06-20-2012 for   . Chest pain, non-cardiac   . Chronic headaches   . Cigarette smoker   . Complication of anesthesia 05-17-2012   trouble breathing after colonscopy, needed nebulizer  . Diverticulosis   . GERD (gastroesophageal reflux disease)   . Hepatic hemangioma   . Hypertension   . IBS (irritable bowel syndrome)   . Obesity   .  Pyloric erosion   . Sleep apnea    CPAP setting varies from 4-10    Patient Active Problem List   Diagnosis Date Noted  . Peripheral arterial disease ([place]) 01/19/2017  . 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 Lower abdominal pain 10/05/2016  . Head congestion 08/21/2016  . Pneumonitis 07/08/2016  . Routine general medical examination at a health care facility 05/26/2016  . Left knee pain 05/15/2016  . Flank pain 04/21/2016  . Abdominal pain 02/28/2016  . Dysuria 02/28/2016  . GERD (gastroesophageal reflux disease) 02/14/2016  . Morbid obesity ([place]) 02/14/2016  . Tobacco use disorder 08/12/2015  . Pulmonary nodule, right 04/11/2015  . COPD (chronic obstructive pulmonary disease) ([place]) 01/18/2015  . Mediastinal lymphadenopathy 01/18/2015  . DOE (dyspnea on exertion) 03/08/2014  . Abdominal pain, left lower quadrant 08/23/2013  . Migraine without aura 07/26/2013  . Incisional hernia 02/11/2012  . Sleep apnea 02/11/2012  . Chest discomfort 10/12/2011  . HTN (hypertension) 10/12/2011    Past Surgical History:  Procedure Laterality Date  . CARDIAC CATHETERIZATION  12/28/2007   EF 75%. IT REVEALS NORMAL/SUPRANORMAL LEFT VENTRICULAR SYSTOLIC FUNCTION  . CARDIOVASCULAR STRESS TEST  03/25/2007   EF 78%  . CESAREAN SECTION  2003, 2009   x 2  . CHOLECYSTECTOMY  1993  . COLONOSCOPY  2014  . ENDOMETRIAL ABLATION  09/2010   and D&C  . HERNIA REPAIR  7681   umbilical  . LAPAROSCOPY    . TUBAL LIGATION    . UMBILICAL HERNIA REPAIR  2004  . US ECHOCARDIOGRAPHY  05/08/2008   EF 55-60%  . VENTRAL HERNIA REPAIR  06/15/2012   Procedure: LAPAROSCOPIC VENTRAL HERNIA;  Surgeon: Edward Jolly, MD;  Location: [facility] ORS;  Service: General;  Laterality: N/A;  LAPAROSCOPIC REPAIR VENTRAL HERNIA    OB History    No data available       Home Medications    Prior to Admission medications   Medication Sig Start Date End Date Taking? Authorizing  Provider  albuterol (PROVENTIL HFA;VENTOLIN HFA) 108 (90 Base) MCG/ACT inhaler Inhale 2 puffs into the lungs every 4 (four) hours as needed for wheezing or shortness of breath (cough, shortness of breath or wheezing.). 08/21/16  Yes Golden Circle, FNP  aspirin EC 325 MG tablet Take 1 tablet (325 mg total) by mouth daily. 01/19/17  Yes Charlene Brooke Nche, NP  atorvastatin (LIPITOR) 40 MG tablet Take 1 tablet (40 mg total) by mouth daily. 01/19/17  Yes Charlene Brooke Nche, NP  buPROPion (WELLBUTRIN XL) 150 MG 24 hr tablet Take 150 mg by mouth daily.   Yes Historical Provider, MD  calcium carbonate (TUMS - DOSED IN MG ELEMENTAL CALCIUM) 500 MG chewable tablet Chew 3 tablets by mouth daily as needed for indigestion or heartburn.    Yes Historical Provider, MD  carvedilol (COREG) 25 MG tablet Take 1 tablet (25 mg total) by mouth 2 (two) times daily. 05/26/16  Yes Golden Circle, FNP  dicyclomine (BENTYL) 10 MG capsule TAKE 1 CAPSULE (10 MG TOTAL) BY MOUTH 3 (THREE) TIMES DAILY AS NEEDED FOR SPASMS. 02/04/17  Yes Ladene Artist, MD  doxazosin (CARDURA) 2 MG tablet Take 2 mg by mouth 2 (two) times daily.  12/24/16  Yes Historical Provider, MD  ibuprofen (ADVIL,MOTRIN) 200 MG tablet Take 600 mg by mouth every 6 (six) hours as needed for moderate pain.   Yes Historical Provider, MD  losartan (COZAAR) 100 MG tablet Take 1 tablet (100 mg total) by mouth daily. 05/26/16  Yes Golden Circle, FNP  omeprazole (PRILOSEC) 40 MG capsule TAKE 1 CAPSULE (40 MG TOTAL) BY MOUTH 2 (TWO) TIMES DAILY. 11/02/16  Yes Ladene Artist, MD  spironolactone (ALDACTONE) 50 MG tablet Take 1 tablet (50 mg total) by mouth daily. 12/08/16  Yes Thayer Headings, MD    Family History Family History  Problem Relation Age of Onset  . Hypertension Mother   . Lung cancer Mother   . Diabetes Maternal Grandmother   . Heart disease Father   . Heart attack Paternal Uncle   . Breast cancer    . [name] cancer Neg Hx     Social History Social  History  Substance Use Topics  . Smoking status: Current Every Day Smoker    Packs/day: 0.50    Years: 25.00    Types: Cigarettes    Last attempt to quit: 09/18/2016  . Smokeless tobacco: Never Used  . Alcohol use 0.0 oz/week     Comment: very rare     Allergies   Ceftin [cefuroxime]; Guaifenesin & derivatives; Hctz [hydrochlorothiazide]; Morphine and related; Naproxen; Lisinopril; and Percocet [oxycodone-acetaminophen]   Review of Systems Review of Systems  Respiratory: Positive for shortness of breath.   Cardiovascular: Positive for chest pain.  Gastrointestinal: Positive for abdominal pain (epigastric).  Neurological: Positive for light-headedness.  All other systems reviewed and are negative.    Physical Exam Updated Vital Signs BP (!) 153/82   Pulse 64   Temp 97.6 F (36.4 C) (Oral)   Resp 16   Ht 5'  5" (1.651 m)   Wt 131.5 kg   SpO2 100%   BMI 48.26 kg/m   Physical Exam  Constitutional: She is oriented to person, place, and time. She appears well-developed and well-nourished. No distress.  Morbidly obese  HENT:  Head: Normocephalic and atraumatic.  Mouth/Throat: Uvula is midline, oropharynx is clear and moist and mucous membranes are normal. No oropharyngeal exudate, posterior oropharyngeal edema, posterior oropharyngeal erythema or tonsillar abscesses. No tonsillar exudate.  Eyes: Conjunctivae and EOM are normal. Right eye exhibits no discharge. Left eye exhibits no discharge. No scleral icterus.  Neck: Normal range of motion. Neck supple.  Cardiovascular: Normal rate, regular rhythm, normal heart sounds and intact distal pulses.   Pulmonary/Chest: Effort normal and breath sounds normal. No respiratory distress. She has no wheezes. She has no rales. She exhibits no tenderness.  Abdominal: Soft. Bowel sounds are normal. She exhibits no distension and no mass. There is tenderness. There is no rebound and no guarding. No hernia.  Mild TTP over epigastric region.   Musculoskeletal: Normal range of motion. She exhibits edema. She exhibits no tenderness.  Nonpitting edema present to BLE, right slightly larger than left.  Neurological: She is alert and oriented to person, place, and time. No sensory deficit.  Skin: Skin is warm and dry. She is not diaphoretic.  Nursing note and vitals reviewed.    ED Treatments / Results  Labs (all labs ordered are listed, but only abnormal results are displayed) Labs Reviewed  BASIC METABOLIC PANEL - Abnormal; Notable for the following:       Result Value   Glucose, Bld 126 (*)    Calcium 10.5 (*)    All other components within normal limits  CBC - Abnormal; Notable for the following:    Hemoglobin 15.6 (*)    All other components within normal limits  I-STAT TROPOININ, ED    EKG  EKG Interpretation  Date/Time:  Thursday February 18 2017 11:53:07 EDT Ventricular Rate:  69 PR Interval:  204 QRS Duration: 98 QT Interval:  386 QTC Calculation: 413 R Axis:   -11 Text Interpretation:  Normal sinus rhythm Possible Anteroseptal infarct , age undetermined Abnormal ECG Confirmed by Hazle Coca [phone]) on 02/18/2017 2:04:23 PM       Radiology Dg Chest 2 View  Result Date: 02/18/2017 CLINICAL DATA:  Chest pain. EXAM: CHEST  2 VIEW COMPARISON:  Radiographs of September 16, 2016 FINDINGS: The heart size and mediastinal contours are within normal limits. Both lungs are clear. No pneumothorax or pleural effusion is noted. The visualized skeletal structures are unremarkable. IMPRESSION: No active cardiopulmonary disease. Electronically Signed   By: Marijo Conception, M.D.   On: 02/18/2017 12:48    Procedures Procedures (including critical care time)  Medications Ordered in ED Medications  ketorolac (TORADOL) 30 MG/ML injection 30 mg (not administered)  gi cocktail (Maalox,Lidocaine,Donnatal) (30 mLs Oral Given 02/18/17 1409)     Initial Impression / Assessment and Plan / ED Course  I have reviewed the triage vital  signs and the nursing notes.  Pertinent labs & imaging results that were available during my care of the patient were reviewed by me and considered in my medical decision making (see chart for details).    Patient presents with constant midsternal chest pain that started 3 days ago while she was sitting at work. Reports having similar episode in the past related to reflux but denies improvement with Tums and omeprazole taken at home. Denies personal or family history of  cardiac disease. PMHx of hypertension, PVD, cigarette smoker, obesity. VSS. Exam revealed mild tenderness over epigastric region, no peritoneal signs. Mild nonpitting edema noted to bilateral lower extremities which patient reports is chronic and unchanged. Pt given GI cocktail in the ED. Remaining exam unremarkable. EKG shows sinus rhythm, no acute ischemic changes compared to prior. Troponin negative. Labs unremarkable. Chest x-ray negative. HEART score 4. Chart review shows lexiscan myoview performed on 02/28/14 with LVEF 73%, nml LV function and nml wall motion. Heart cath performed on 12/28/07 showed minor coronary artery irregularities, nml LV systolic function. Discussed pt with Dr. Ralene Bathe. Due to pt with multiple risk factors, consulted cardiology. Cardiology advised CP does not seem cardiac in etiology, suspect musculoskeletal, advised to f/u with Dr. Acie Fredrickson outpatient. Pt given toradol in the ED. Discussed results and plan for d/c with pt. Plan to d/c pt home with symptomatic tx and follow up outpatient. Discussed return precautions.   Final Clinical Impressions(s) / ED Diagnoses   Final diagnoses:  Chest wall pain    New Prescriptions New Prescriptions   No medications on file     Nona Dell, PA-C 02/18/17 [place], MD 02/19/17 [phone]

## 2017-02-18 NOTE — ED Triage Notes (Signed)
Pt reports aching central chest pain that started three days ago associated with headaches and lightheadedness. She states she has been taking Omeprazole at home without relief.

## 2017-02-18 NOTE — Consult Note (Signed)
Cardiology Consult    Patient ID: Lisa [name] MRN: [id number], DOB/AGE: 54-29-1962   Admit date: 02/18/2017 Date of Consult: 02/18/2017  Primary Physician: Mauricio Po, [place] Primary Cardiologist: Dr. Acie Fredrickson Requesting Provider: Dr. Ralene Bathe  Reason for Consult: chest pain  Patient Profile    [name] is a 56 yo female with a PMH significant for COPD, leg edema, HTN, and hx of chest pain. She presented to the [facility] with c/o chest pain. Cardiology was consulted for ACS evaluation.  Past Medical History   Past Medical History:  Diagnosis Date  . Allergic rhinitis   . Asthma   . Breast nodule    right breast, to see dr Helane Rima 06-20-2012 for   . Chest pain, non-cardiac   . Chronic headaches   . Cigarette smoker   . Complication of anesthesia 05-17-2012   trouble breathing after colonscopy, needed nebulizer  . Diverticulosis   . GERD (gastroesophageal reflux disease)   . Hepatic hemangioma   . Hypertension   . IBS (irritable bowel syndrome)   . Obesity   . Pyloric erosion   . Sleep apnea    CPAP setting varies from 4-10    Past Surgical History:  Procedure Laterality Date  . CARDIAC CATHETERIZATION  12/28/2007   EF 75%. IT REVEALS NORMAL/SUPRANORMAL LEFT VENTRICULAR SYSTOLIC FUNCTION  . CARDIOVASCULAR STRESS TEST  03/25/2007   EF 78%  . CESAREAN SECTION  2003, 2009   x 2  . CHOLECYSTECTOMY  1993  . COLONOSCOPY  2014  . ENDOMETRIAL ABLATION  09/2010   and D&C  . HERNIA REPAIR  9528   umbilical  . LAPAROSCOPY    . TUBAL LIGATION    . UMBILICAL HERNIA REPAIR  2004  . US ECHOCARDIOGRAPHY  05/08/2008   EF 55-60%  . VENTRAL HERNIA REPAIR  06/15/2012   Procedure: LAPAROSCOPIC VENTRAL HERNIA;  Surgeon: Edward Jolly, MD;  Location: [facility];  Service: General;  Laterality: N/A;  LAPAROSCOPIC REPAIR VENTRAL HERNIA     Allergies  Allergies  Allergen Reactions  . Ceftin [Cefuroxime]     Severe stomach pain - Diverticulitis flare up  . Naproxen     Insomnia  .  Hctz [Hydrochlorothiazide]     Causes a headache  . Lisinopril Cough    Cough   . Morphine And Related     Moodiness.  Causes anger.  . Other     guaifensen             Restlessness, jittery  . Percocet [Oxycodone-Acetaminophen] Other (See Comments)    History of Present Illness    Ms Lisa [name] is known to our service and last saw Dr. Acie Fredrickson on 09/22/16. At that time, she was in her normal sate of health. She has intermittently complained of atypical chest discomfort that does not occur when she walks on the treadmill. She has undergone heart cath in 2009 without intervention and myoview in 2015 that was intermediate risk; managed medically at that time. She has since been compliant on her medications.   She states she has had a constant pain in her chest since Tues 02/16/17 accompanied by SOB and lightheadedness. 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 She denies syncope. The chest pain was not relieved by her home GI medications (tums, omeprazole). She called the office and was advised to go to the ED. On arrival to the ED, she was given a GI cocktail with some improvement in symptoms. She  rates the chest pain at a 5-6/10, constant pain that is aching in nature. The pain is not pressure or crushing, "it just hurts." It does  Not radiate anywhere. This is a different pain than what she had in 2009 before her heart cath. She walks the family dog daily and has not had chest pain at rest or with this exertion before Tues. The associated shortness of breath is intermittent, she is on room air. She lives at home with her husband and 2 children. She currently smokes. She has not traveled recently.  Inpatient Medications       Outpatient Medications    Prior to Admission medications   Medication Sig Start Date End Date Taking? Authorizing Provider  albuterol (PROVENTIL HFA;VENTOLIN HFA) 108 (90 Base) MCG/ACT inhaler Inhale 2 puffs into the lungs every 4 (four) hours as  needed for wheezing or shortness of breath (cough, shortness of breath or wheezing.). 08/21/16   Golden Circle, FNP  aspirin EC 325 MG tablet Take 1 tablet (325 mg total) by mouth daily. 01/19/17   Flossie Buffy, NP  atorvastatin (LIPITOR) 40 MG tablet Take 1 tablet (40 mg total) by mouth daily. 01/19/17   Flossie Buffy, NP  buPROPion (WELLBUTRIN SR) 150 MG 12 hr tablet Take 1 tablet (150 mg total) by mouth 2 (two) times daily. 09/09/16   Rigoberto Noel, MD  carvedilol (COREG) 25 MG tablet Take 1 tablet (25 mg total) by mouth 2 (two) times daily. 05/26/16   Golden Circle, FNP  dicyclomine (BENTYL) 10 MG capsule  01/08/17   Historical Provider, MD  dicyclomine (BENTYL) 10 MG capsule TAKE 1 CAPSULE (10 MG TOTAL) BY MOUTH 3 (THREE) TIMES DAILY AS NEEDED FOR SPASMS. 02/04/17   Ladene Artist, MD  losartan (COZAAR) 100 MG tablet Take 1 tablet (100 mg total) by mouth daily. 05/26/16   Golden Circle, FNP  lubiprostone (AMITIZA) 24 MCG capsule Take 1 capsule (24 mcg total) by mouth 2 (two) times daily with a meal. 10/05/16   Golden Circle, FNP  omeprazole (PRILOSEC) 40 MG capsule TAKE 1 CAPSULE (40 MG TOTAL) BY MOUTH 2 (TWO) TIMES DAILY. 11/02/16   Ladene Artist, MD  spironolactone (ALDACTONE) 50 MG tablet Take 1 tablet (50 mg total) by mouth daily. 12/08/16   Thayer Headings, MD  TURMERIC PO Take 1 tablet by mouth. Every 2 days    Historical Provider, MD     Family History     Family History  Problem Relation Age of Onset  . Hypertension Mother   . Lung cancer Mother   . Diabetes Maternal Grandmother   . Heart disease Father   . Heart attack Paternal Uncle   . Breast cancer    . Colon cancer Neg Hx     Social History    Social History   Social History  . Marital status: Married    Spouse name: Science writer  . Number of children: 2  . Years of education: 14+   Occupational History  . 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 Smoking status: Current Every  Day Smoker    Packs/day: 0.50    Years: 25.00    Types: Cigarettes    Last attempt to quit: 09/18/2016  . Smokeless tobacco: Never Used  . Alcohol use 0.0 oz/week     Comment: very rare  . Drug use: No  . Sexual activity: Not Currently  Other Topics Concern  . Not on file   Social History Narrative   Patient lives at her home with her family.   Caffeine Use: 1 soda occasionally     Review of Systems    General:  No chills, fever, night sweats or weight changes.  Cardiovascular:  Positive for chest pain, dyspnea on exertion, and edema/ No orthopnea, palpitations, paroxysmal nocturnal dyspnea. Dermatological: No rash, lesions/masses Respiratory: No cough, Positive dyspnea Urologic: No hematuria, dysuria Abdominal:   No nausea, vomiting, diarrhea, bright red blood per rectum, melena, or hematemesis Neurologic:  No visual changes, changes in mental status. All other systems reviewed and are otherwise negative except as noted above.  Physical Exam    Blood pressure 114/75, pulse 61, temperature 97.6 F (36.4 C), temperature source Oral, resp. rate 17, height 5\' 5"  (1.651 m), weight 290 lb (131.5 kg), SpO2 99 %.  General: Pleasant, NAD, obese woman Psych: Normal affect. Neuro: Alert and oriented X 3. Moves all extremities spontaneously. HEENT: Normal  Neck: Supple without bruits or JVD, but difficult to assess. Lungs:  Resp regular and unlabored, CTA. Heart: RRR no murmurs, heart sounds distant. Abdomen: Soft, non-distended, BS + x 4. Mildly tender with palpation in the upper epigastric area  Extremities: No clubbing, cyanosis. Trace B LE edema. DP/PT/Radials 1+ and equal bilaterally.  Labs    Troponin (Point of Care Test)  Recent Labs  02/18/17 1230  TROPIPOC 0.00   No results for input(s): CKTOTAL, CKMB, TROPONINI in the last 72 hours. Lab Results  Component Value Date   WBC 8.1 02/18/2017   HGB 15.6 (H) 02/18/2017   HCT 45.6 02/18/2017   MCV 89.8 02/18/2017    PLT 252 02/18/2017     Recent Labs Lab 02/18/17 1201  NA 140  K 4.0  CL 104  CO2 25  BUN 16  CREATININE 0.98  CALCIUM 10.5*  GLUCOSE 126*   Lab Results  Component Value Date   CHOL 215 (H) 05/26/2016   HDL 34.50 (L) 05/26/2016   LDLCALC 155 (H) 05/26/2016   TRIG 130.0 05/26/2016   Lab Results  Component Value Date   DDIMER  12/27/2007    0.36        AT THE INHOUSE ESTABLISHED CUTOFF VALUE OF 0.48 ug/mL FEU, THIS ASSAY HAS BEEN DOCUMENTED IN THE LITERATURE TO HAVE     Radiology Studies    Dg Chest 2 View  Result Date: 02/18/2017 CLINICAL DATA:  Chest pain. EXAM: CHEST  2 VIEW COMPARISON:  Radiographs of September 16, 2016 FINDINGS: The heart size and mediastinal contours are within normal limits. Both lungs are clear. No pneumothorax or pleural effusion is noted. The visualized skeletal structures are unremarkable. IMPRESSION: No active cardiopulmonary disease. Electronically Signed   By: Marijo Conception, M.D.   On: 02/18/2017 12:48    ECG & Cardiac Imaging    EKG 02/18/17: NSR, Q waves V1/V2  Lexiscan Myoview 02/28/14: Intermediate risk stress nuclear study with a msll reversible defect in the distal LAD (SDS 3). . LV Ejection Fraction: 73%.  LV Wall Motion:  NL LV Function; NL Wall Motion  Heart Cath 12/28/07:  1. Minor coronary artery irregularities.  2. Normal left ventricular systolic function. No intervention  Assessment & Plan    1. Chest pain - troponin negative - EKG with Q waves in V1/V2, minor changes from 09/22/16 with unclear significance - She has HTN and HLD, no DM and is currently trying to quit smoking. - She never  sleeps flat, but could not sleep in bed last night because of her chest pain - Risk factors for ACS include HTN, HLD, and smoking; however, this does not sound cardiac in nature. Troponin negative after 48hr of pain. EKG stable. Question GI or MSK causes.  2. HTN - continue coreg, losartan, and spironolactone  3. HLD - continue  lipitor  4. PAD - pt with trace edema in B LE, no diuretic home.   76. Former smoker / COPD - currently working with her pulmonologist to quit smoking   Signed, Courtney Paris 02/18/2017, 2:49 PM  As above, patient seen and examined. Briefly she is a 56 year old female with past medical history of hypertension, hyperlipidemia, COPD, peripheral vascular disease, irritable bowel syndrome and recurrent chest pain for evaluation of chest pain. Patient had a cardiac catheterization in 2009 that showed no obstructive coronary disease. Nuclear study 2015 showed a small reversible defect at the apex and she has been treated medically. Echocardiogram August 2016 showed normal LV systolic function, grade 1 diastolic dysfunction, mild left ventricular hypertrophy and mild left atrial enlargement. Patient developed chest pain on March 20 after eating. The pain is substernal and radiates to the left breast. It is described as an aching sensation. It has been continuous without completely resolving. She has had no associated nausea, diaphoresis or increased dyspnea. The pain is not pleuritic or positional. She denies exertional chest pain but does have some dyspnea on exertion. Orthopnea, PND or pedal edema. Cardiology now asked to evaluate.  Physical exam Patient obese. 1/6 systolic ejection murmur. Some tenderness over chest wall.  Laboratories show normal troponin. Chest x-ray negative. Hemoglobin 15.6. Electrocardiogram shows sinus rhythm and prior septal infarct cannot be excluded. No change compared to previous.  1 Chest pain-chest pain is extremely atypical. It has been continuous for over 48 hours. Her troponin is normal and she has therefore ruled out. Her electrocardiogram shows no ST changes. Likely musculoskeletal. She could be discharged from a cardiac standpoint. She can follow-up with Dr. Acie Fredrickson as an outpatient. Further evaluation and therapy for possible noncardiac chest pain per  emergency room.  2 tobacco abuse-patient counseled on discontinuing.  3 hypertension-blood pressure is mildly elevated. Would continue outpatient medications and follow. Advance as needed.  Kirk Ruths, MD

## 2017-02-18 NOTE — Telephone Encounter (Signed)
New message      Pt c/o of Chest Pain: STAT if CP now or developed within 24 hours  1. Are you having CP right now?  yes 2. Are you experiencing any other symptoms (ex. SOB, nausea, vomiting, sweating)?  dizziness 3. How long have you been experiencing CP?  Since tues 4. Is your CP continuous or coming and going?  Continuous and all of her GI meds has not helped 5. Have you taken Nitroglycerin?  ?no

## 2017-02-18 NOTE — Telephone Encounter (Signed)
I spoke with patient. She states chest pain started Tuesday PM, sternal achy pain not relieved by GI medication. She c/o pain worsening since then, now having SOB, HA, and feeling lightheaded w/ back pain.  She denies having NTG. I advised patient to seek  immediate medical attention, call 911 or go to ED.  She states she will and to please let Dr. Acie Fredrickson know.  I advised I will send a message. Pt voiced understanding of my instruction and agreed with plan.

## 2017-02-18 NOTE — Discharge Instructions (Signed)
I recommend taking 600 mg ibuprofen every 6 hours as needed for chest pain. Continue taking your home prescriptions of that resolved as prescribed. I recommend following up with your primary care provider within the next 4-5 days for follow up evaluation.  Please return to the Emergency Department if symptoms worsen or new onset of fever, dizziness, headache, new/worsening chest pain, difficulty breathing, coughing up blood, vomiting, abdominal pain, numbness, weakness, syncope.

## 2017-02-18 NOTE — Telephone Encounter (Signed)
Patient reports constant chest pain since Tuesday "In my sternum". She also reports that she is having some SOB and is light headed. She sounds uncomfortable and if she is in mild distress on the phone.  She gets no relief from TUMS, rest, or positional changes.  "Nothing makes it better or worse". She is asking to be seen today.  I advised her she that her symptoms do not sound GI in nature and that she needs to proceed to the ED for evaluation. She has an appt with her primary care today at 4:00 and asked if she could wait.  I advised her the it would be best to not wait until this afternoon.  She reports to me that she has a cardiologist and she wants to call them first. She thanked me for the call.

## 2017-02-24 ENCOUNTER — Ambulatory Visit: Payer: Managed Care, Other (non HMO) | Admitting: Cardiovascular Disease

## 2017-02-26 ENCOUNTER — Ambulatory Visit (INDEPENDENT_AMBULATORY_CARE_PROVIDER_SITE_OTHER): Payer: Managed Care, Other (non HMO) | Admitting: Cardiovascular Disease

## 2017-02-26 ENCOUNTER — Encounter: Payer: Self-pay | Admitting: Cardiovascular Disease

## 2017-02-26 VITALS — BP 138/76 | HR 72 | Ht 65.0 in | Wt 285.0 lb

## 2017-02-26 DIAGNOSIS — R0789 Other chest pain: Secondary | ICD-10-CM | POA: Diagnosis not present

## 2017-02-26 DIAGNOSIS — I739 Peripheral vascular disease, unspecified: Secondary | ICD-10-CM

## 2017-02-26 DIAGNOSIS — I10 Essential (primary) hypertension: Secondary | ICD-10-CM | POA: Diagnosis not present

## 2017-02-26 NOTE — Progress Notes (Signed)
Lisa [name] Date of Birth  01-Jan-1961 [facility] [address]    [place], [place]  [address] [phone]  Fax  [phone]   Problem List:  1. Chest pain - normal cath in the past 2. Cigarette smoking. 3. COPD 4. Leg edema   History of Present Illness:  Lisa [name] Is a 56 year old female with a history of hypertension. He's had some occasional episodes of chest discomfort.  These episodes of chest pain occur at times and are not necessarily associated with eating, drinking, change of position, or exercise.  She is walking on the treadmill 3 times a week.  She does not have chest pain while she is walking.  Feb. 26, 2014:  Lisa [name] is doing well.  She has gained weight since I last saw her.  She has not been walking as much.  March 19, 2014:   She was seen by [name] last month for some chest pain. She had a Myoview study which revealed an intermediate risk. She continues to smoke. She is very upset and emotional at her last office visit.  She was started on Imdur but was  having lots of side effects.   She is still trying to stop smoking.   She has attended the smoking cessation classes at [facility].    She has started walking with her son several days ago.  The exercise is going OK.    She does have some occasional click chest pains. These only last for several seconds.  She has had progressive bilateral edema    Oct. 28, 2015:  Lisa [name] feels ok.  No further episodes of CP Having numbness in her right thigh.   I suggested that this was a back or neuro issue. She has intermittant leg edema.  August 19th, 2016  Lisa [name] called today complaining of some leg edema. She's had  leg edema on an intermittent basis. BP has been well controlled. Has tried to exercise but has lots of back pain and leg pain and leg weakness.  The leg swelling has worsened over the past several weeks.  On lasix   Weight has increased significantly over the past year    Nov. 28,  2016:  Lisa [name] is doing ok Watching what she eats  Has lost 14 lbs over 3 months    Wt Readings from Last 3 Encounters:  10/28/15 276 lb 1.9 oz (125.247 kg)  08/12/15 277 lb (125.646 kg)  07/19/15 290 lb (131.543 kg)   BP is well controlled.   Oct.  24, 2017:  Lisa [name] ws seen by urgent care last week . BP was 202 / 100  Also has chest pain . CP every day  We have done at least 2 caths  Has stopped smoking   Not eating salty food.   February 26, 2017:  Lisa [name] is seen today for follow up of an episode of CP last week.  Feeling better.  Still smoking  - less than a PPD . Seeing Dr. Lamonte Sakai  Has been diagnosed with PAD  - knows that this is due to smoking.     Current Outpatient Prescriptions on File Prior to Visit  Medication Sig Dispense Refill  . aspirin EC 325 MG tablet Take 1 tablet (325 mg total) by mouth daily. 30 tablet 0  . atorvastatin (LIPITOR) 40 MG tablet Take 1 tablet (40 mg total) by mouth daily. 90 tablet 3  . buPROPion (WELLBUTRIN XL) 150 MG 24 hr tablet Take 150 mg by mouth  daily.    . calcium carbonate (TUMS - DOSED IN MG ELEMENTAL CALCIUM) 500 MG chewable tablet Chew 3 tablets by mouth daily as needed for indigestion or heartburn.     . carvedilol (COREG) 25 MG tablet Take 1 tablet (25 mg total) by mouth 2 (two) times daily. 180 tablet 3  . dicyclomine (BENTYL) 10 MG capsule TAKE 1 CAPSULE (10 MG TOTAL) BY MOUTH 3 (THREE) TIMES DAILY AS NEEDED FOR SPASMS. 90 capsule 3  . doxazosin (CARDURA) 2 MG tablet Take 2 mg by mouth 2 (two) times daily.     Marland Kitchen ibuprofen (ADVIL,MOTRIN) 200 MG tablet Take 600 mg by mouth every 6 (six) hours as needed for moderate pain.    Marland Kitchen losartan (COZAAR) 100 MG tablet Take 1 tablet (100 mg total) by mouth daily. 90 tablet 3  . omeprazole (PRILOSEC) 40 MG capsule TAKE 1 CAPSULE (40 MG TOTAL) BY MOUTH 2 (TWO) TIMES DAILY. 180 capsule 1  . spironolactone (ALDACTONE) 50 MG tablet Take 1 tablet (50 mg total) by mouth daily. 90 tablet 2   No  current facility-administered medications on file prior to visit.     Allergies  Allergen Reactions  . Ceftin [Cefuroxime] Other (See Comments)    Severe stomach pain - Diverticulitis flare up  . Guaifenesin & Derivatives Other (See Comments)    Restlessness, jittery  . Hctz [Hydrochlorothiazide] Other (See Comments)    Causes a headache  . Morphine And Related Other (See Comments)    Moodiness.  Causes anger.  . Naproxen Other (See Comments)    Insomnia  . Lisinopril Cough       . Percocet [Oxycodone-Acetaminophen] Other (See Comments)    Past Medical History:  Diagnosis Date  . Allergic rhinitis   . Asthma   . Breast nodule    right breast, to see dr Helane Rima 06-20-2012 for   . Chest pain, non-cardiac   . Chronic headaches   . Cigarette smoker   . Complication of anesthesia 05-17-2012   trouble breathing after colonscopy, needed nebulizer  . Diverticulosis   . GERD (gastroesophageal reflux disease)   . Hepatic hemangioma   . Hypertension   . IBS (irritable bowel syndrome)   . Obesity   . Pyloric erosion   . Sleep apnea    CPAP setting varies from 4-10    Past Surgical History:  Procedure Laterality Date  . CARDIAC CATHETERIZATION  12/28/2007   EF 75%. IT REVEALS NORMAL/SUPRANORMAL LEFT VENTRICULAR SYSTOLIC FUNCTION  . CARDIOVASCULAR STRESS TEST  03/25/2007   EF 78%  . CESAREAN SECTION  2003, 2009   x 2  . CHOLECYSTECTOMY  1993  . COLONOSCOPY  2014  . ENDOMETRIAL ABLATION  09/2010   and D&C  . HERNIA REPAIR  3557   umbilical  . LAPAROSCOPY    . TUBAL LIGATION    . UMBILICAL HERNIA REPAIR  2004  . US ECHOCARDIOGRAPHY  05/08/2008   EF 55-60%  . VENTRAL HERNIA REPAIR  06/15/2012   Procedure: LAPAROSCOPIC VENTRAL HERNIA;  Surgeon: Edward Jolly, MD;  Location: [facility];  Service: General;  Laterality: N/A;  LAPAROSCOPIC REPAIR VENTRAL HERNIA    History  Smoking Status  . Current Every Day Smoker  . Packs/day: 0.50  . Years: 25.00  . Types: Cigarettes  .  Last attempt to quit: 09/18/2016  Smokeless Tobacco  . Never Used    History  Alcohol Use  . 0.0 oz/week    Comment: very rare    Family  History  Problem Relation Age of Onset  . Hypertension Mother   . Lung cancer Mother   . Diabetes Maternal Grandmother   . Heart disease Father   . Heart attack Paternal Uncle   . Breast cancer    . Colon cancer Neg Hx     Reviw of Systems:  Reviewed in the HPI.  All other systems are negative.  Physical Exam: BP 138/76 (BP Location: Left Arm)   Pulse 72   Ht 5\' 5"  (1.651 m)   Wt 285 lb (129.3 kg)   BMI 47.43 kg/m  The patient is alert and oriented x 3.  The mood and affect are normal.   Skin: warm and dry.  Color is normal.   HEENT:   Normal carotids, no JVD, neck is supple Lungs: clear  Heart: RR, normal   , rib tenderness  Abdomen: + BS, no HSM Extremities:  Bilateral leg edema ( trace )  bilateral   with chroinic stasis changes Neuro:  Non focal     ECG: February 26, 2017:   NSR at 68 with 1st degree AV block .   Assessment / Plan:   1. Chest pain - normal cath in the past.  She has chest wall pain. I do not think that her chest pains are cardiac. myoview in 2015 was negative.   Would have a low threshold to repeat the myoview.  Is not exercising because of her PAD .   2. Cigarette smoking.   Discuss at some length  3. COPD -  4. PAD :  Going to see Dr Oneida Alar.    Advised smoking cessation 5. Essential HTN:  Blood pressure is well controlled today     I have also encouraged her to lose weight and to exercise   Mertie Moores, MD  02/26/2017 8:33 AM    [facility] [address],  [place], [place]  [address] Pager [phone] Phone: [phone]; Fax: [phone]

## 2017-02-26 NOTE — Patient Instructions (Signed)
Medication Instructions:    Your physician recommends that you continue on your current medications as directed. Please refer to the Current Medication list given to you today.  --- If you need a refill on your cardiac medications before your next appointment, please call your pharmacy. ---  Labwork:  Your physician recommends that you return for lab work in 6 months for: BMET, TSH & Lipid panel -- YOU WILL NEED TO BE FASTING FOR THIS LAB WORK.  Testing/Procedures:  None ordered  Follow-Up:  Your physician wants you to follow-up in: 6 months with Dr. Acie Fredrickson.  You will receive a reminder letter in the mail two months in advance. If you don't receive a letter, please call our office to schedule the follow-up appointment.  Thank you for choosing [facility]!!

## 2017-03-03 ENCOUNTER — Encounter: Payer: Self-pay | Admitting: Vascular Surgery

## 2017-03-11 ENCOUNTER — Ambulatory Visit (INDEPENDENT_AMBULATORY_CARE_PROVIDER_SITE_OTHER): Payer: Managed Care, Other (non HMO) | Admitting: Family

## 2017-03-11 ENCOUNTER — Encounter: Payer: Self-pay | Admitting: Family

## 2017-03-11 ENCOUNTER — Encounter: Payer: Self-pay | Admitting: Vascular Surgery

## 2017-03-11 ENCOUNTER — Other Ambulatory Visit (INDEPENDENT_AMBULATORY_CARE_PROVIDER_SITE_OTHER): Payer: Managed Care, Other (non HMO)

## 2017-03-11 ENCOUNTER — Ambulatory Visit (HOSPITAL_COMMUNITY)
Admission: RE | Admit: 2017-03-11 | Discharge: 2017-03-11 | Disposition: A | Discharge status: Home / Self Care | Payer: Managed Care, Other (non HMO) | Source: Ambulatory Visit | Attending: Vascular Surgery | Admitting: Vascular Surgery

## 2017-03-11 ENCOUNTER — Encounter (HOSPITAL_COMMUNITY): Payer: Self-pay

## 2017-03-11 ENCOUNTER — Ambulatory Visit (INDEPENDENT_AMBULATORY_CARE_PROVIDER_SITE_OTHER): Payer: Managed Care, Other (non HMO) | Admitting: Vascular Surgery

## 2017-03-11 VITALS — BP 134/84 | HR 68 | Temp 98.3°F | Resp 20 | Ht 65.0 in | Wt 286.0 lb

## 2017-03-11 VITALS — BP 146/90 | HR 73 | Temp 98.3°F | Resp 18 | Ht 65.0 in | Wt 287.8 lb

## 2017-03-11 DIAGNOSIS — I739 Peripheral vascular disease, unspecified: Secondary | ICD-10-CM

## 2017-03-11 DIAGNOSIS — E1165 Type 2 diabetes mellitus with hyperglycemia: Secondary | ICD-10-CM

## 2017-03-11 DIAGNOSIS — E119 Type 2 diabetes mellitus without complications: Secondary | ICD-10-CM

## 2017-03-11 HISTORY — DX: Type 2 diabetes mellitus without complications: E11.9

## 2017-03-11 LAB — HEMOGLOBIN A1C: Hgb A1c MFr Bld: 7.7 % — ABNORMAL HIGH (ref 4.6–6.5)

## 2017-03-11 MED ORDER — METFORMIN HCL ER 500 MG PO TB24: 500.0000 mg | ORAL_TABLET | Freq: Every day | ORAL | 1 refills | Status: DC

## 2017-03-11 NOTE — Progress Notes (Signed)
Subjective:    Patient ID: Lisa [name], female    DOB: 05-Jan-1961, 56 y.o.   MRN: [id number]  Chief Complaint  Patient presents with  . Follow-up    leg pain, was told that heart doctor that she needs to lose weight and she is wanting to talk about surgery    HPI:  Lisa [name] is a 56 y.o. female who  has a past medical history of Allergic rhinitis; Asthma; Breast nodule; Chest pain, non-cardiac; Chronic headaches; Cigarette smoker; Complication of anesthesia (05-17-2012); Diverticulosis; GERD (gastroesophageal reflux disease); Hepatic hemangioma; Hypertension; IBS (irritable bowel syndrome); Obesity; Pyloric erosion; and Sleep apnea. and presents today for a follow up office visit.  Recently evaluated in the office for leg pain with concern for claudication with normal ankle-brachial index with concern for neurogenic claudication from the lumbar spine. She continues to experience pain located in her lower legs which is modified and improved with Motrin 600 mg 2-3 times per day. Does continue to have middle low back pain. No radiculopathy from the back. Pain is located in the middle of her lumbar spine. Also wishing to lose weight. Reports that she has tried diet pills in the past as well as exercise. She continues to struggle with exercise secondary to her leg pain. Nutritionally eats about 2 meals per day. Does not like fruit but does eat vegetables.    Allergies  Allergen Reactions  . Ceftin [Cefuroxime] Other (See Comments)    Severe stomach pain - Diverticulitis flare up  . Guaifenesin & Derivatives Other (See Comments)    Restlessness, jittery  . Hctz [Hydrochlorothiazide] Other (See Comments)    Causes a headache  . Morphine And Related Other (See Comments)    Moodiness.  Causes anger.  . Naproxen Other (See Comments)    Insomnia  . Lisinopril Cough       . Percocet [Oxycodone-Acetaminophen] Other (See Comments)      Outpatient Medications Prior to Visit    Medication Sig Dispense Refill  . atorvastatin (LIPITOR) 40 MG tablet Take 1 tablet (40 mg total) by mouth daily. 90 tablet 3  . buPROPion (WELLBUTRIN XL) 150 MG 24 hr tablet Take 150 mg by mouth daily.    . calcium carbonate (TUMS - DOSED IN MG ELEMENTAL CALCIUM) 500 MG chewable tablet Chew 3 tablets by mouth daily as needed for indigestion or heartburn.     . carvedilol (COREG) 25 MG tablet Take 1 tablet (25 mg total) by mouth 2 (two) times daily. 180 tablet 3  . dicyclomine (BENTYL) 10 MG capsule TAKE 1 CAPSULE (10 MG TOTAL) BY MOUTH 3 (THREE) TIMES DAILY AS NEEDED FOR SPASMS. 90 capsule 3  . doxazosin (CARDURA) 2 MG tablet Take 2 mg by mouth 2 (two) times daily.     Marland Kitchen ibuprofen (ADVIL,MOTRIN) 200 MG tablet Take 600 mg by mouth every 6 (six) hours as needed for moderate pain.    Marland Kitchen losartan (COZAAR) 100 MG tablet Take 1 tablet (100 mg total) by mouth daily. 90 tablet 3  . omeprazole (PRILOSEC) 40 MG capsule TAKE 1 CAPSULE (40 MG TOTAL) BY MOUTH 2 (TWO) TIMES DAILY. 180 capsule 1  . spironolactone (ALDACTONE) 50 MG tablet Take 1 tablet (50 mg total) by mouth daily. 90 tablet 2  . aspirin EC 325 MG tablet Take 1 tablet (325 mg total) by mouth daily. 30 tablet 0   No facility-administered medications prior to visit.       Past Surgical History:  Procedure  Laterality Date  . CARDIAC CATHETERIZATION  12/28/2007   EF 75%. IT REVEALS NORMAL/SUPRANORMAL LEFT VENTRICULAR SYSTOLIC FUNCTION  . CARDIOVASCULAR STRESS TEST  03/25/2007   EF 78%  . CESAREAN SECTION  2003, 2009   x 2  . CHOLECYSTECTOMY  1993  . COLONOSCOPY  2014  . ENDOMETRIAL ABLATION  09/2010   and D&C  . HERNIA REPAIR  4193   umbilical  . LAPAROSCOPY    . TUBAL LIGATION    . UMBILICAL HERNIA REPAIR  2004  . US ECHOCARDIOGRAPHY  05/08/2008   EF 55-60%  . VENTRAL HERNIA REPAIR  06/15/2012   Procedure: LAPAROSCOPIC VENTRAL HERNIA;  Surgeon: Edward Jolly, MD;  Location: [facility];  Service: General;  Laterality: N/A;   LAPAROSCOPIC REPAIR VENTRAL HERNIA     Past Medical History:  Diagnosis Date  . Allergic rhinitis   . Asthma   . Breast nodule    right breast, to see dr Helane Rima 06-20-2012 for   . Chest pain, non-cardiac   . Chronic headaches   . Cigarette smoker   . Complication of anesthesia 05-17-2012   trouble breathing after colonscopy, needed nebulizer  . Diverticulosis   . GERD (gastroesophageal reflux disease)   . Hepatic hemangioma   . Hypertension   . IBS (irritable bowel syndrome)   . Obesity   . Pyloric erosion   . Sleep apnea    CPAP setting varies from 4-10      Review of Systems  Constitutional: Negative for chills and fever.  Respiratory: Negative for chest tightness and shortness of breath.   Musculoskeletal: Positive for back pain.       Positive for bilateral leg pain.  Neurological: Negative for tremors, weakness and numbness.      Objective:    BP (!) 146/90 (BP Location: Left Arm, Patient Position: Sitting, Cuff Size: Large)   Pulse 73   Temp 98.3 F (36.8 C) (Oral)   Resp 18   Ht 5\' 5"  (1.651 m)   Wt 287 lb 12.8 oz (130.5 kg)   SpO2 96%   BMI 47.89 kg/m  Nursing note and vital signs reviewed.  Wt Readings from Last 3 Encounters:  03/11/17 286 lb (129.7 kg)  03/11/17 287 lb 12.8 oz (130.5 kg)  02/26/17 285 lb (129.3 kg)    Physical Exam  Constitutional: She is oriented to person, place, and time. She appears well-developed and well-nourished. No distress.  Cardiovascular: Normal rate, regular rhythm, normal heart sounds and intact distal pulses.   Pulmonary/Chest: Effort normal and breath sounds normal.  Musculoskeletal:  Low back - No obvious deformity, discoloration, or edema. Thre is increased lordotic curve. Palpable tenderness of lumbar transverse processes and paraspinal musculature. Range of motion slightly limited in flexion and normal in remaining directions. Distal pulses and sensation are intact and appropriate. Negative straight leg  raise.  Bilateral lower legs - No obvious deformity, discoloration,or edema. No palpable calf tenderness. Ankle ROM within normal limits. Distal pulses and sensation are intact and appropriate.   Neurological: She is alert and oriented to person, place, and time.  Skin: Skin is warm and dry.  Psychiatric: She has a normal mood and affect. Her behavior is normal. Judgment and thought content normal.       Assessment & Plan:   Problem List Items Addressed This Visit      Cardiovascular and Mediastinum   PAD (peripheral artery disease) ([place])    Continues to work with vascular with concern for neurogenic claudication. Given  normal ABI, cannot be ruled out. Obtain MRI. Encouraged to work on heat, ice, and home exercise therapy pending MRI results. May need neurological consult if symptoms do not improve.       Relevant Orders   MR Lumbar Spine Wo Contrast     Other   Morbid obesity ([place]) - Primary    Recommend aggressive weight loss through caloric restriction of 1200-1500 calories daily. Encouraged to investigate [employer] Patience, Weight Watchers, and Nutrisystem. She is on Wellbutrin currently, unsure that Contrave would make any significant difference. Does qualify for bariatric surgery given obesity and associated co-morbid conditions including hypertension and obstructive sleep apnea. There is concern for diabetes. Obtain A1c. Recommend calorie tracking software which she has not started. Continue to monitor.       Relevant Orders   Amb ref to Medical Nutrition Therapy-MNT   Hemoglobin A1c (Completed)       I have discontinued Ms. Hakimi's aspirin EC. I am also having her maintain her carvedilol, losartan, omeprazole, spironolactone, atorvastatin, dicyclomine, buPROPion, doxazosin, calcium carbonate, and ibuprofen.   Follow-up: Return if symptoms worsen or fail to improve.  Mauricio Po, FNP

## 2017-03-11 NOTE — Patient Instructions (Addendum)
Thank you for choosing [facility].  SUMMARY AND INSTRUCTIONS:  They will call to schedule your appointment with nutrition and for your MRI.  Recommend a 1200-1500 calorie diet.  Exercise as tolerated.  Use MyFitnessPal to track calories.  Goal of 10,000 steps per day as able.   Labs:  Please stop by the lab on the lower level of the building for your blood work. Your results will be released to [place] (or called to you) after review, usually within 72 hours after test completion. If any changes need to be made, you will be notified at that same time.  1.) The lab is open from 7:30am to 5:30 pm Monday-Friday 2.) No appointment is necessary 3.) Fasting (if needed) is 6-8 hours after food and drink; black coffee and water are okay   Follow up:  If your symptoms worsen or fail to improve, please contact our office for further instruction, or in case of emergency go directly to the emergency room at the closest medical facility.    Calorie Counting for Weight Loss Calories are units of energy. Your body needs a certain amount of calories from food to keep you going throughout the day. When you eat more calories than your body needs, your body stores the extra calories as fat. When you eat fewer calories than your body needs, your body burns fat to get the energy it needs. Calorie counting means keeping track of how many calories you eat and drink each day. Calorie counting can be helpful if you need to lose weight. If you make sure to eat fewer calories than your body needs, you should lose weight. Ask your health care provider what a healthy weight is for you. For calorie counting to work, you will need to eat the right number of calories in a day in order to lose a healthy amount of weight per week. A dietitian can help you determine how many calories you need in a day and will give you suggestions on how to reach your calorie goal.  A healthy amount of weight to lose per week  is usually 1-2 lb (0.5-0.9 kg). This usually means that your daily calorie intake should be reduced by 500-750 calories.  Eating 1,200 - 1,500 calories per day can help most women lose weight.  Eating 1,500 - 1,800 calories per day can help most men lose weight. What is my plan? My goal is to have __________ calories per day. If I have this many calories per day, I should lose around __________ pounds per week. What do I need to know about calorie counting? In order to meet your daily calorie goal, you will need to:  Find out how many calories are in each food you would like to eat. Try to do this before you eat.  Decide how much of the food you plan to eat.  Write down what you ate and how many calories it had. Doing this is called keeping a food log. To successfully lose weight, it is important to balance calorie counting with a healthy lifestyle that includes regular activity. Aim for 150 minutes of moderate exercise (such as walking) or 75 minutes of vigorous exercise (such as running) each week. Where do I find calorie information?   The number of calories in a food can be found on a Nutrition Facts label. If a food does not have a Nutrition Facts label, try to look up the calories online or ask your dietitian for help. Remember that calories  are listed per serving. If you choose to have more than one serving of a food, you will have to multiply the calories per serving by the amount of servings you plan to eat. For example, the label on a package of bread might say that a serving size is 1 slice and that there are 90 calories in a serving. If you eat 1 slice, you will have eaten 90 calories. If you eat 2 slices, you will have eaten 180 calories. How do I keep a food log? Immediately after each meal, record the following information in your food log:  What you ate. Don't forget to include toppings, sauces, and other extras on the food.  How much you ate. This can be measured in cups,  ounces, or number of items.  How many calories each food and drink had.  The total number of calories in the meal. Keep your food log near you, such as in a small notebook in your pocket, or use a mobile app or website. Some programs will calculate calories for you and show you how many calories you have left for the day to meet your goal. What are some calorie counting tips?  Use your calories on foods and drinks that will fill you up and not leave you hungry:  Some examples of foods that fill you up are nuts and nut butters, vegetables, lean proteins, and high-fiber foods like whole grains. High-fiber foods are foods with more than 5 g fiber per serving.  Drinks such as sodas, specialty coffee drinks, alcohol, and juices have a lot of calories, yet do not fill you up.  Eat nutritious foods and avoid empty calories. Empty calories are calories you get from foods or beverages that do not have many vitamins or protein, such as candy, sweets, and soda. It is better to have a nutritious high-calorie food (such as an avocado) than a food with few nutrients (such as a bag of chips).  Know how many calories are in the foods you eat most often. This will help you calculate calorie counts faster.  Pay attention to calories in drinks. Low-calorie drinks include water and unsweetened drinks.  Pay attention to nutrition labels for "low fat" or "fat free" foods. These foods sometimes have the same amount of calories or more calories than the full fat versions. They also often have added sugar, starch, or salt, to make up for flavor that was removed with the fat.  Find a way of tracking calories that works for you. Get creative. Try different apps or programs if writing down calories does not work for you. What are some portion control tips?  Know how many calories are in a serving. This will help you know how many servings of a certain food you can have.  Use a measuring cup to measure serving sizes.  You could also try weighing out portions on a kitchen scale. With time, you will be able to estimate serving sizes for some foods.  Take some time to put servings of different foods on your favorite plates, bowls, and cups so you know what a serving looks like.  Try not to eat straight from a bag or box. Doing this can lead to overeating. Put the amount you would like to eat in a cup or on a plate to make sure you are eating the right portion.  Use smaller plates, glasses, and bowls to prevent overeating.  Try not to multitask (for example, watch TV or use your  computer) while eating. If it is time to eat, sit down at a table and enjoy your food. This will help you to know when you are full. It will also help you to be aware of what you are eating and how much you are eating. What are tips for following this plan? Reading food labels   Check the calorie count compared to the serving size. The serving size may be smaller than what you are used to eating.  Check the source of the calories. Make sure the food you are eating is high in vitamins and protein and low in saturated and trans fats. Shopping   Read nutrition labels while you shop. This will help you make healthy decisions before you decide to purchase your food.  Make a grocery list and stick to it. Cooking   Try to cook your favorite foods in a healthier way. For example, try baking instead of frying.  Use low-fat dairy products. Meal planning   Use more fruits and vegetables. Half of your plate should be fruits and vegetables.  Include lean proteins like poultry and fish. How do I count calories when eating out?  Ask for smaller portion sizes.  Consider sharing an entree and sides instead of getting your own entree.  If you get your own entree, eat only half. Ask for a box at the beginning of your meal and put the rest of your entree in it so you are not tempted to eat it.  If calories are listed on the menu, choose the  lower calorie options.  Choose dishes that include vegetables, fruits, whole grains, low-fat dairy products, and lean protein.  Choose items that are boiled, broiled, grilled, or steamed. Stay away from items that are buttered, battered, fried, or served with cream sauce. Items labeled "crispy" are usually fried, unless stated otherwise.  Choose water, low-fat milk, unsweetened iced tea, or other drinks without added sugar. If you want an alcoholic beverage, choose a lower calorie option such as a glass of wine or light beer.  Ask for dressings, sauces, and syrups on the side. These are usually high in calories, so you should limit the amount you eat.  If you want a salad, choose a garden salad and ask for grilled meats. Avoid extra toppings like bacon, cheese, or fried items. Ask for the dressing on the side, or ask for olive oil and vinegar or lemon to use as dressing.  Estimate how many servings of a food you are given. For example, a serving of cooked rice is  cup or about the size of half a baseball. Knowing serving sizes will help you be aware of how much food you are eating at restaurants. The list below tells you how big or small some common portion sizes are based on everyday objects:  1 oz-4 stacked dice.  3 oz-1 deck of cards.  1 tsp-1 die.  1 Tbsp- a ping-pong ball.  2 Tbsp-1 ping-pong ball.   cup- baseball.  1 cup-1 baseball. Summary  Calorie counting means keeping track of how many calories you eat and drink each day. If you eat fewer calories than your body needs, you should lose weight.  A healthy amount of weight to lose per week is usually 1-2 lb (0.5-0.9 kg). This usually means reducing your daily calorie intake by 500-750 calories.  The number of calories in a food can be found on a Nutrition Facts label. If a food does not have a Nutrition Facts label,  try to look up the calories online or ask your dietitian for help.  Use your calories on foods and drinks  that will fill you up, and not on foods and drinks that will leave you hungry.  Use smaller plates, glasses, and bowls to prevent overeating. This information is not intended to replace advice given to you by your health care provider. Make sure you discuss any questions you have with your health care provider. Document Released: 11/16/2005 Document Revised: 10/16/2016 Document Reviewed: 10/16/2016 Elsevier Interactive Patient Education  2017 [employer].

## 2017-03-11 NOTE — Progress Notes (Signed)
Referring: [name]  Patient name: Lisa [name] MRN: [id number] DOB: February 28, 1961 Sex: female  REASON FOR CONSULT: Claudication  HPI: Lisa [name] is a 56 y.o. female with several month history of pain in her right hip and foot which occurs with walking. When she rests this pain resolves fairly quickly. She also complains of occasional right thigh numbness. She also has back pain as being worked up for pseudo-claudication and has an MRI pending for 03/25/2017. She denies rest pain. She denies nonhealing wounds. She currently smokes 1 pack of cigarettes per day. Greater than 3 minutes today spent regarding smoking cessation counseling. Other medical problems include obesity, asthma, hypertension, sleep apnea all of which are currently stable. She is on a statin. She states she is intolerant of aspirin. She gets abdominal cramping with this.  Past Medical History:  Diagnosis Date  . Allergic rhinitis   . Asthma   . Breast nodule    right breast, to see dr Helane Rima 06-20-2012 for   . Chest pain, non-cardiac   . Chronic headaches   . Cigarette smoker   . Complication of anesthesia 05-17-2012   trouble breathing after colonscopy, needed nebulizer  . Diverticulosis   . GERD (gastroesophageal reflux disease)   . Hepatic hemangioma   . Hypertension   . IBS (irritable bowel syndrome)   . Obesity   . Pyloric erosion   . Sleep apnea    CPAP setting varies from 4-10   Past Surgical History:  Procedure Laterality Date  . CARDIAC CATHETERIZATION  12/28/2007   EF 75%. IT REVEALS NORMAL/SUPRANORMAL LEFT VENTRICULAR SYSTOLIC FUNCTION  . CARDIOVASCULAR STRESS TEST  03/25/2007   EF 78%  . CESAREAN SECTION  2003, 2009   x 2  . CHOLECYSTECTOMY  1993  . COLONOSCOPY  2014  . ENDOMETRIAL ABLATION  09/2010   and D&C  . HERNIA REPAIR  6237   umbilical  . LAPAROSCOPY    . TUBAL LIGATION    . UMBILICAL HERNIA REPAIR  2004  . US ECHOCARDIOGRAPHY  05/08/2008   EF 55-60%  . VENTRAL  HERNIA REPAIR  06/15/2012   Procedure: LAPAROSCOPIC VENTRAL HERNIA;  Surgeon: Edward Jolly, MD;  Location: [facility];  Service: General;  Laterality: N/A;  LAPAROSCOPIC REPAIR VENTRAL HERNIA    Family History  Problem Relation Age of Onset  . Hypertension Mother   . Lung cancer Mother   . Diabetes Maternal Grandmother   . Heart disease Father   . Heart attack Paternal Uncle   . Breast cancer    . Colon cancer Neg Hx     SOCIAL HISTORY: Social History   Social History  . Marital status: Married    Spouse name: Science writer  . Number of children: 2  . Years of education: 14+   Occupational History  . 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 Smoking status: Current Every Day Smoker    Packs/day: 0.50    Years: 25.00    Types: Cigarettes    Last attempt to quit: 09/18/2016  . Smokeless tobacco: Never Used  . Alcohol use 0.0 oz/week     Comment: very rare  . Drug use: No  . Sexual activity: Not Currently   Other Topics Concern  . Not on file   Social History Narrative   Patient lives at her home with her family.   Caffeine Use: 1 soda occasionally    Allergies  Allergen Reactions  . Ceftin [Cefuroxime] Other (See Comments)    Severe stomach pain - Diverticulitis flare up  . Guaifenesin & Derivatives Other (See Comments)    Restlessness, jittery  . Hctz [Hydrochlorothiazide] Other (See Comments)    Causes a headache  . Morphine And Related Other (See Comments)    Moodiness.  Causes anger.  . Naproxen Other (See Comments)    Insomnia  . Lisinopril Cough       . Percocet [Oxycodone-Acetaminophen] Other (See Comments)    Current Outpatient Prescriptions  Medication Sig Dispense Refill  . atorvastatin (LIPITOR) 40 MG tablet Take 1 tablet (40 mg total) by mouth daily. 90 tablet 3  . buPROPion (WELLBUTRIN XL) 150 MG 24 hr tablet Take 150 mg by mouth daily.    . calcium carbonate (TUMS - DOSED IN MG ELEMENTAL CALCIUM) 500 MG chewable tablet  Chew 3 tablets by mouth daily as needed for indigestion or heartburn.     . carvedilol (COREG) 25 MG tablet Take 1 tablet (25 mg total) by mouth 2 (two) times daily. 180 tablet 3  . dicyclomine (BENTYL) 10 MG capsule TAKE 1 CAPSULE (10 MG TOTAL) BY MOUTH 3 (THREE) TIMES DAILY AS NEEDED FOR SPASMS. 90 capsule 3  . doxazosin (CARDURA) 2 MG tablet Take 2 mg by mouth 2 (two) times daily.     Marland Kitchen ibuprofen (ADVIL,MOTRIN) 200 MG tablet Take 600 mg by mouth every 6 (six) hours as needed for moderate pain.    Marland Kitchen losartan (COZAAR) 100 MG tablet Take 1 tablet (100 mg total) by mouth daily. 90 tablet 3  . omeprazole (PRILOSEC) 40 MG capsule TAKE 1 CAPSULE (40 MG TOTAL) BY MOUTH 2 (TWO) TIMES DAILY. 180 capsule 1  . spironolactone (ALDACTONE) 50 MG tablet Take 1 tablet (50 mg total) by mouth daily. 90 tablet 2   No current facility-administered medications for this visit.     ROS:   General:  No weight loss, Fever, chills  HEENT: No recent headaches, no nasal bleeding, no visual changes, no sore throat  Neurologic: No dizziness, blackouts, seizures. No recent symptoms of stroke or mini- stroke. No recent episodes of slurred speech, or temporary blindness.  Cardiac: No recent episodes of chest pain/pressure, no shortness of breath at rest.  + shortness of breath with exertion.  Denies history of atrial fibrillation or irregular heartbeat  Vascular: No history of rest pain in feet.  No history of claudication.  No history of non-healing ulcer, No history of DVT   Pulmonary: No home oxygen, no productive cough, no hemoptysis,  No asthma or wheezing  Musculoskeletal:  [X]  Arthritis, [X]  Low back pain,  [X]  Joint pain  Hematologic:No history of hypercoagulable state.  No history of easy bleeding.  No history of anemia  Gastrointestinal: No hematochezia or melena,  No gastroesophageal reflux, no trouble swallowing  Urinary: [ ]  chronic Kidney disease, [ ]  on HD - [ ]  MWF or [ ]  TTHS, [ ]  Burning with  urination, [ ]  Frequent urination, [ ]  Difficulty urinating;   Skin: No rashes  Psychological: No history of anxiety,  No history of depression   Physical Examination  Vitals:   03/11/17 1219  BP: 134/84  Pulse: 68  Resp: 20  Temp: 98.3 F (36.8 C)  TempSrc: Oral  SpO2: 96%  Weight: 286 lb (129.7 kg)  Height: 5\' 5"  (1.651 m)    Body mass index is 47.59 kg/m.  General:  Alert and oriented, no acute distress HEENT: Normal  Neck: No bruit or JVD Pulmonary: Clear to auscultation bilaterally Cardiac: Regular Rate and Rhythm without murmur Abdomen: Soft, non-tender, non-distended, no mass, Obese Skin: No rash or ulcer Extremity Pulses:  2+ radial, brachial, 2+ right 1+ left femoral, absent dorsalis pedis, posterior tibial pulses bilaterally Musculoskeletal: No deformity or edema  Neurologic: Upper and lower extremity motor 5/5 and symmetric  DATA:  Patient had bilateral ABIs performed 01/15/2017. They were 0.98 on the right 0.94 on the left  ASSESSMENT:  Patient's history and clinical exam have some elements of peripheral arterial disease. However, I agree that she also has some elements of pseudo-claudication that may be related to problems with her back. I discussed with her today that she is currently not at risk of limb loss. I think conservative management first with a walking program of 30 minutes daily and trying to stop smoking would be the first course of action. If she fails conservative management and her MRI of the back does not really show any other cause for her leg problems we could consider an arteriogram.   PLAN:  Follow-up 6 weeks plan as above   Ruta Hinds, MD [facility]: [phone] Pager: [phone]

## 2017-03-11 NOTE — Assessment & Plan Note (Signed)
Continues to work with vascular with concern for neurogenic claudication. Given normal ABI, cannot be ruled out. Obtain MRI. Encouraged to work on heat, ice, and home exercise therapy pending MRI results. May need neurological consult if symptoms do not improve.

## 2017-03-11 NOTE — Assessment & Plan Note (Signed)
Recommend aggressive weight loss through caloric restriction of 1200-1500 calories daily. Encouraged to investigate [employer] Patience, Weight Watchers, and Nutrisystem. She is on Wellbutrin currently, unsure that Contrave would make any significant difference. Does qualify for bariatric surgery given obesity and associated co-morbid conditions including hypertension and obstructive sleep apnea. There is concern for diabetes. Obtain A1c. Recommend calorie tracking software which she has not started. Continue to monitor.

## 2017-03-12 NOTE — Addendum Note (Signed)
Addended by: Lianne Cure A on: 03/12/2017 09:13 AM   Modules accepted: Orders

## 2017-03-16 ENCOUNTER — Telehealth: Payer: Self-pay | Admitting: Family

## 2017-03-16 NOTE — Telephone Encounter (Signed)
Pt called wanting results from 4/12 Please call back

## 2017-03-18 NOTE — Telephone Encounter (Signed)
Pt aware of results 

## 2017-03-24 ENCOUNTER — Encounter: Payer: Self-pay | Admitting: Family

## 2017-03-24 ENCOUNTER — Ambulatory Visit (INDEPENDENT_AMBULATORY_CARE_PROVIDER_SITE_OTHER): Payer: Managed Care, Other (non HMO) | Admitting: Family

## 2017-03-24 VITALS — BP 128/82 | HR 73 | Temp 98.8°F | Resp 16 | Ht 65.0 in | Wt 279.0 lb

## 2017-03-24 DIAGNOSIS — E1165 Type 2 diabetes mellitus with hyperglycemia: Secondary | ICD-10-CM | POA: Diagnosis not present

## 2017-03-24 MED ORDER — ONETOUCH ULTRA MINI W/DEVICE KIT: PACK | 0 refills | Status: DC

## 2017-03-24 MED ORDER — GLUCOSE BLOOD VI STRP: ORAL_STRIP | 12 refills | Status: DC

## 2017-03-24 MED ORDER — ONETOUCH SURESOFT LANCING DEV MISC: 1 refills | Status: DC

## 2017-03-24 NOTE — Assessment & Plan Note (Signed)
Newly diagnosed with Type 2 diabetes and has made significant improvements to her current nutritional intake losing 10 pounds since previous office visit. Blood glucose monitoring discussed with meter and supplies sent to pharmacy. Recommend checking blood sugars 1-2 times per day. Foot exam completed today. Encouraged to complete diabetic eye exam independently. Pneumovax up to date. Continue current dosage of metformin with follow up A1c in 2 months.

## 2017-03-24 NOTE — Patient Instructions (Addendum)
Thank you for choosing [facility].  SUMMARY AND INSTRUCTIONS:  BREATHE!  They will call to schedule your appointment with diabetes education.  Check blood sugars 1-2 times per day.   Medication:  Continue to take your medications as prescribed.   Follow up:  If your symptoms worsen or fail to improve, please contact our office for further instruction, or in case of emergency go directly to the emergency room at the closest medical facility.    Diabetes Mellitus and Exercise Exercising regularly is important for your overall health, especially when you have diabetes (diabetes mellitus). Exercising is not only about losing weight. It has many health benefits, such as increasing muscle strength and bone density and reducing body fat and stress. This leads to improved fitness, flexibility, and endurance, all of which result in better overall health. Exercise has additional benefits for people with diabetes, including:  Reducing appetite.  Helping to lower and control blood glucose.  Lowering blood pressure.  Helping to control amounts of fatty substances (lipids) in the blood, such as cholesterol and triglycerides.  Helping the body to respond better to insulin (improving insulin sensitivity).  Reducing how much insulin the body needs.  Decreasing the risk for heart disease by:  Lowering cholesterol and triglyceride levels.  Increasing the levels of good cholesterol.  Lowering blood glucose levels. What is my activity plan? Your health care provider or certified diabetes educator can help you make a plan for the type and frequency of exercise (activity plan) that works for you. Make sure that you:  Do at least 150 minutes of moderate-intensity or vigorous-intensity exercise each week. This could be brisk walking, biking, or water aerobics.  Do stretching and strength exercises, such as yoga or weightlifting, at least 2 times a week.  Spread out your activity over  at least 3 days of the week.  Get some form of physical activity every day.  Do not go more than 2 days in a row without some kind of physical activity.  Avoid being inactive for more than 90 minutes at a time. Take frequent breaks to walk or stretch.  Choose a type of exercise or activity that you enjoy, and set realistic goals.  Start slowly, and gradually increase the intensity of your exercise over time. What do I need to know about managing my diabetes?  Check your blood glucose before and after exercising.  If your blood glucose is higher than 240 mg/dL (13.3 mmol/L) before you exercise, check your urine for ketones. If you have ketones in your urine, do not exercise until your blood glucose returns to normal.  Know the symptoms of low blood glucose (hypoglycemia) and how to treat it. Your risk for hypoglycemia increases during and after exercise. Common symptoms of hypoglycemia can include:  Hunger.  Anxiety.  Sweating and feeling clammy.  Confusion.  Dizziness or feeling light-headed.  Increased heart rate or palpitations.  Blurry vision.  Tingling or numbness around the mouth, lips, or tongue.  Tremors or shakes.  Irritability.  Keep a rapid-acting carbohydrate snack available before, during, and after exercise to help prevent or treat hypoglycemia.  Avoid injecting insulin into areas of the body that are going to be exercised. For example, avoid injecting insulin into:  The arms, when playing tennis.  The legs, when jogging.  Keep records of your exercise habits. Doing this can help you and your health care provider adjust your diabetes management plan as needed. Write down:  Food that you eat before and  after you exercise.  Blood glucose levels before and after you exercise.  The type and amount of exercise you have done.  When your insulin is expected to peak, if you use insulin. Avoid exercising at times when your insulin is peaking.  When you  start a new exercise or activity, work with your health care provider to make sure the activity is safe for you, and to adjust your insulin, medicines, or food intake as needed.  Drink plenty of water while you exercise to prevent dehydration or heat stroke. Drink enough fluid to keep your urine clear or pale yellow. This information is not intended to replace advice given to you by your health care provider. Make sure you discuss any questions you have with your health care provider. Document Released: 02/06/2004 Document Revised: 06/05/2016 Document Reviewed: 04/27/2016 Elsevier Interactive Patient Education  2017 [place].  Diabetes Mellitus and Food It is important for you to manage your blood sugar (glucose) level. Your blood glucose level can be greatly affected by what you eat. Eating healthier foods in the appropriate amounts throughout the day at about the same time each day will help you control your blood glucose level. It can also help slow or prevent worsening of your diabetes mellitus. Healthy eating may even help you improve the level of your blood pressure and reach or maintain a healthy weight. General recommendations for healthful eating and cooking habits include:  Eating meals and snacks regularly. Avoid going long periods of time without eating to lose weight.  Eating a diet that consists mainly of plant-based foods, such as fruits, vegetables, nuts, legumes, and whole grains.  Using low-heat cooking methods, such as baking, instead of high-heat cooking methods, such as deep frying. Work with your dietitian to make sure you understand how to use the Nutrition Facts information on food labels. How can food affect me? Carbohydrates  Carbohydrates affect your blood glucose level more than any other type of food. Your dietitian will help you determine how many carbohydrates to eat at each meal and teach you how to count carbohydrates. Counting carbohydrates is important to  keep your blood glucose at a healthy level, especially if you are using insulin or taking certain medicines for diabetes mellitus. Alcohol  Alcohol can cause sudden decreases in blood glucose (hypoglycemia), especially if you use insulin or take certain medicines for diabetes mellitus. Hypoglycemia can be a life-threatening condition. Symptoms of hypoglycemia (sleepiness, dizziness, and disorientation) are similar to symptoms of having too much alcohol. If your health care provider has given you approval to drink alcohol, do so in moderation and use the following guidelines:  Women should not have more than one drink per day, and men should not have more than two drinks per day. One drink is equal to:  12 oz of beer.  5 oz of wine.  1 oz of hard liquor.  Do not drink on an empty stomach.  Keep yourself hydrated. Have water, diet soda, or unsweetened iced tea.  Regular soda, juice, and other mixers might contain a lot of carbohydrates and should be counted. What foods are not recommended? As you make food choices, it is important to remember that all foods are not the same. Some foods have fewer nutrients per serving than other foods, even though they might have the same number of calories or carbohydrates. It is difficult to get your body what it needs when you eat foods with fewer nutrients. Examples of foods that you should avoid that  are high in calories and carbohydrates but low in nutrients include:  Trans fats (most processed foods list trans fats on the Nutrition Facts label).  Regular soda.  Juice.  Candy.  Sweets, such as cake, pie, doughnuts, and cookies.  Fried foods. What foods can I eat? Eat nutrient-rich foods, which will nourish your body and keep you healthy. The food you should eat also will depend on several factors, including:  The calories you need.  The medicines you take.  Your weight.  Your blood glucose level.  Your blood pressure level.  Your  cholesterol level. You should eat a variety of foods, including:  Protein.  Lean cuts of meat.  Proteins low in saturated fats, such as fish, egg whites, and beans. Avoid processed meats.  Fruits and vegetables.  Fruits and vegetables that may help control blood glucose levels, such as apples, mangoes, and yams.  Dairy products.  Choose fat-free or low-fat dairy products, such as milk, yogurt, and cheese.  Grains, bread, pasta, and rice.  Choose whole grain products, such as multigrain bread, whole oats, and brown rice. These foods may help control blood pressure.  Fats.  Foods containing healthful fats, such as nuts, avocado, olive oil, canola oil, and fish. Does everyone with diabetes mellitus have the same meal plan? Because every person with diabetes mellitus is different, there is not one meal plan that works for everyone. It is very important that you meet with a dietitian who will help you create a meal plan that is just right for you. This information is not intended to replace advice given to you by your health care provider. Make sure you discuss any questions you have with your health care provider. Document Released: 08/13/2005 Document Revised: 04/23/2016 Document Reviewed: 10/13/2013 Elsevier Interactive Patient Education  2017 [place]:  Current A1c:  Lab Results  Component Value Date   HGBA1C 7.7 (H) 03/11/2017    A1C Goal: <7.0%  Fasting Blood Sugar Goal: 80-130 Blood sugar check that you take after fasting for at least 8 hours which is usually in the morning before eating.  Post-prandial Blood Sugar Goal: <180 Blood sugar check approximately 1-2 hours after the start of a meal.   Diabetes Prevention Screens:  1.) Ensure you have an annual eye exam by an ophthalmologist or optometrist.   2,) Ensure you have an annual foot exam or when any changes are noted including new onset numbness/tingling or wounds. Check your  feet daily!  3.) The American Diabetes Association recommends the Pneumovax vaccination against pneumonia every 5 years.  4.) We will check your kidney function with a urine test on an annual basis.

## 2017-03-24 NOTE — Progress Notes (Signed)
Subjective:    Patient ID: Lisa [name], female    DOB: 1961-08-10, 57 y.o.   MRN: [id number]  Chief Complaint  Patient presents with  . Follow-up    diabetes    HPI:  Lisa [name] is a 56 y.o. female who  has a past medical history of Allergic rhinitis; Asthma; Breast nodule; Chest pain, non-cardiac; Chronic headaches; Cigarette smoker; Complication of anesthesia (05-17-2012); Diverticulosis; GERD (gastroesophageal reflux disease); Hepatic hemangioma; Hypertension; IBS (irritable bowel syndrome); Obesity; Pyloric erosion; Sleep apnea; and Type 2 diabetes mellitus ([place]) (03/11/2017). and presents today for a follow up office visit.  1.) Diabetes - Newly diagnosed with Type 2 diabetes and started on metformin. Reports taking the medication as prescribed and continues to have some looser stools and she is able to tolerate it. Has lost 10 pounds since previous office visit. Denies excessive, hunger, thirst or urination. No symptoms of end organ damage. Working on improving her nutritional intake.   Lab Results  Component Value Date   HGBA1C 7.7 (H) 03/11/2017     Allergies  Allergen Reactions  . Ceftin [Cefuroxime] Other (See Comments)    Severe stomach pain - Diverticulitis flare up  . Guaifenesin & Derivatives Other (See Comments)    Restlessness, jittery  . Hctz [Hydrochlorothiazide] Other (See Comments)    Causes a headache  . Morphine And Related Other (See Comments)    Moodiness.  Causes anger.  . Naproxen Other (See Comments)    Insomnia  . Lisinopril Cough       . Percocet [Oxycodone-Acetaminophen] Other (See Comments)      Outpatient Medications Prior to Visit  Medication Sig Dispense Refill  . atorvastatin (LIPITOR) 40 MG tablet Take 1 tablet (40 mg total) by mouth daily. 90 tablet 3  . buPROPion (WELLBUTRIN XL) 150 MG 24 hr tablet Take 150 mg by mouth daily.    . calcium carbonate (TUMS - DOSED IN MG ELEMENTAL CALCIUM) 500 MG chewable tablet Chew 3 tablets  by mouth daily as needed for indigestion or heartburn.     . carvedilol (COREG) 25 MG tablet Take 1 tablet (25 mg total) by mouth 2 (two) times daily. 180 tablet 3  . dicyclomine (BENTYL) 10 MG capsule TAKE 1 CAPSULE (10 MG TOTAL) BY MOUTH 3 (THREE) TIMES DAILY AS NEEDED FOR SPASMS. 90 capsule 3  . doxazosin (CARDURA) 2 MG tablet Take 2 mg by mouth 2 (two) times daily.     Marland Kitchen ibuprofen (ADVIL,MOTRIN) 200 MG tablet Take 600 mg by mouth every 6 (six) hours as needed for moderate pain.    Marland Kitchen losartan (COZAAR) 100 MG tablet Take 1 tablet (100 mg total) by mouth daily. 90 tablet 3  . metFORMIN (GLUCOPHAGE XR) 500 MG 24 hr tablet Take 1 tablet (500 mg total) by mouth daily with breakfast. 30 tablet 1  . omeprazole (PRILOSEC) 40 MG capsule TAKE 1 CAPSULE (40 MG TOTAL) BY MOUTH 2 (TWO) TIMES DAILY. 180 capsule 1  . spironolactone (ALDACTONE) 50 MG tablet Take 1 tablet (50 mg total) by mouth daily. 90 tablet 2   No facility-administered medications prior to visit.      Review of Systems  Constitutional: Negative for chills, fatigue and fever.  Eyes:       Denies changes in vision  Respiratory: Negative for chest tightness and shortness of breath.   Cardiovascular: Negative for chest pain, palpitations and leg swelling.  Endocrine: Negative for polydipsia, polyphagia and polyuria.  Neurological: Negative for numbness.  Objective:    BP 128/82 (BP Location: Left Arm, Patient Position: Sitting, Cuff Size: Large)   Pulse 73   Temp 98.8 F (37.1 C) (Oral)   Resp 16   Ht '5\' 5"'$  (1.651 m)   Wt 279 lb (126.6 kg)   SpO2 96%   BMI 46.43 kg/m  Nursing note and vital signs reviewed.  Physical Exam  Constitutional: She is oriented to person, place, and time. She appears well-developed and well-nourished. No distress.  Cardiovascular: Normal rate, regular rhythm, normal heart sounds and intact distal pulses.   Pulmonary/Chest: Effort normal and breath sounds normal.  Neurological: She is alert  and oriented to person, place, and time.  Diabetic Foot Exam - Simple   Simple Foot Form Diabetic Foot exam was performed with the following findings:  Yes  03/24/2017  2:01 PM  Visual Inspection No deformities, no ulcerations, no other skin breakdown bilaterally:  Yes Sensation Testing Intact to touch and monofilament testing bilaterally:  Yes Pulse Check Posterior Tibialis and Dorsalis pulse intact bilaterally:  Yes Comments  Skin: Skin is warm and dry.  Psychiatric: She has a normal mood and affect. Her behavior is normal. Judgment and thought content normal.       Assessment & Plan:   Problem List Items Addressed This Visit      Endocrine   Type 2 diabetes mellitus ([place]) - Primary    Newly diagnosed with Type 2 diabetes and has made significant improvements to her current nutritional intake losing 10 pounds since previous office visit. Blood glucose monitoring discussed with meter and supplies sent to pharmacy. Recommend checking blood sugars 1-2 times per day. Foot exam completed today. Encouraged to complete diabetic eye exam independently. Pneumovax up to date. Continue current dosage of metformin with follow up A1c in 2 months.       Relevant Medications   Lancets Misc. (ONE TOUCH SURESOFT) MISC   glucose blood (ONE TOUCH ULTRA TEST) test strip   Blood Glucose Monitoring Suppl (ONE TOUCH ULTRA MINI) w/Device KIT   Other Relevant Orders   Ambulatory referral to diabetic education       I am having [name] start on ONE TOUCH SURESOFT, glucose blood, and ONE TOUCH ULTRA MINI. I am also having her maintain her carvedilol, losartan, omeprazole, spironolactone, atorvastatin, dicyclomine, buPROPion, doxazosin, calcium carbonate, ibuprofen, and metFORMIN.   Meds ordered this encounter  Medications  . Lancets Misc. (ONE TOUCH SURESOFT) MISC    Sig: Use 1 lancet per test. Test blood sugars 1-4 times per day as instructed.    Dispense:  100 each    Refill:  1     Substitution permissible per insurance coverage. Dx E11.9.    Order Specific Question:   Supervising Provider    Answer:   Pricilla Holm A [[id number]]  . glucose blood (ONE TOUCH ULTRA TEST) test strip    Sig: Use one strip per test. Test blood sugars 1-4 times daily as instructed.    Dispense:  50 each    Refill:  12    Substitution permissible per insurance coverage. Dx E11.9.    Order Specific Question:   Supervising Provider    Answer:   Pricilla Holm A [[id number]]  . Blood Glucose Monitoring Suppl (ONE TOUCH ULTRA MINI) w/Device KIT    Sig: Use meter to check blood sugars 1-4 times daily as instructed.    Dispense:  1 each    Refill:  0    Substitution permissible per insurance coverage. Dx  E11.9.    Order Specific Question:   Supervising Provider    Answer:   Pricilla Holm A [[id number]]     Follow-up: Return in about 2 months (around 05/24/2017).  Mauricio Po, FNP

## 2017-03-25 ENCOUNTER — Ambulatory Visit
Admission: RE | Admit: 2017-03-25 | Discharge: 2017-03-25 | Disposition: A | Discharge status: Home / Self Care | Payer: Managed Care, Other (non HMO) | Source: Ambulatory Visit | Attending: Family | Admitting: Family

## 2017-03-25 DIAGNOSIS — I739 Peripheral vascular disease, unspecified: Secondary | ICD-10-CM

## 2017-03-28 ENCOUNTER — Encounter: Payer: Self-pay | Admitting: Family

## 2017-04-09 ENCOUNTER — Telehealth: Payer: Self-pay | Admitting: *Deleted

## 2017-04-09 MED ORDER — ONETOUCH LANCETS MISC: 1 refills | Status: DC

## 2017-04-09 NOTE — Telephone Encounter (Signed)
Rec'd call pt states was given rx for one touch BS monitor, and needing rx for lancets sent to [facility]. Verified chart inform pt will updated and sent rx electronically to [facility].../lmb

## 2017-04-14 LAB — HM DIABETES EYE EXAM

## 2017-04-15 ENCOUNTER — Encounter (INDEPENDENT_AMBULATORY_CARE_PROVIDER_SITE_OTHER): Payer: Managed Care, Other (non HMO) | Admitting: Family Medicine

## 2017-04-16 ENCOUNTER — Encounter: Payer: Self-pay | Admitting: Vascular Surgery

## 2017-04-20 ENCOUNTER — Encounter: Payer: Self-pay | Admitting: Family

## 2017-04-22 ENCOUNTER — Encounter: Payer: Self-pay | Admitting: Vascular Surgery

## 2017-04-22 ENCOUNTER — Encounter (INDEPENDENT_AMBULATORY_CARE_PROVIDER_SITE_OTHER): Payer: Self-pay | Admitting: Family Medicine

## 2017-04-22 ENCOUNTER — Encounter: Payer: Self-pay | Admitting: *Deleted

## 2017-04-22 ENCOUNTER — Ambulatory Visit (HOSPITAL_COMMUNITY)
Admission: RE | Admit: 2017-04-22 | Discharge: 2017-04-22 | Disposition: A | Discharge status: Home / Self Care | Payer: Managed Care, Other (non HMO) | Source: Ambulatory Visit | Attending: Vascular Surgery | Admitting: Vascular Surgery

## 2017-04-22 ENCOUNTER — Ambulatory Visit (INDEPENDENT_AMBULATORY_CARE_PROVIDER_SITE_OTHER): Payer: Managed Care, Other (non HMO) | Admitting: Family Medicine

## 2017-04-22 ENCOUNTER — Ambulatory Visit (INDEPENDENT_AMBULATORY_CARE_PROVIDER_SITE_OTHER): Payer: Managed Care, Other (non HMO) | Admitting: Vascular Surgery

## 2017-04-22 ENCOUNTER — Other Ambulatory Visit: Payer: Self-pay | Admitting: *Deleted

## 2017-04-22 VITALS — BP 144/84 | HR 68 | Temp 98.9°F | Resp 20 | Ht 65.0 in | Wt 271.0 lb

## 2017-04-22 VITALS — BP 109/64 | HR 82 | Temp 98.5°F | Resp 16 | Ht 65.5 in | Wt 271.0 lb

## 2017-04-22 DIAGNOSIS — E1165 Type 2 diabetes mellitus with hyperglycemia: Secondary | ICD-10-CM | POA: Diagnosis not present

## 2017-04-22 DIAGNOSIS — I10 Essential (primary) hypertension: Secondary | ICD-10-CM | POA: Diagnosis not present

## 2017-04-22 DIAGNOSIS — I5030 Unspecified diastolic (congestive) heart failure: Secondary | ICD-10-CM | POA: Diagnosis not present

## 2017-04-22 DIAGNOSIS — R5383 Other fatigue: Secondary | ICD-10-CM | POA: Diagnosis not present

## 2017-04-22 DIAGNOSIS — Z1331 Encounter for screening for depression: Secondary | ICD-10-CM

## 2017-04-22 DIAGNOSIS — Z9189 Other specified personal risk factors, not elsewhere classified: Secondary | ICD-10-CM | POA: Diagnosis not present

## 2017-04-22 DIAGNOSIS — Z0289 Encounter for other administrative examinations: Secondary | ICD-10-CM

## 2017-04-22 DIAGNOSIS — Z1389 Encounter for screening for other disorder: Secondary | ICD-10-CM | POA: Diagnosis not present

## 2017-04-22 DIAGNOSIS — E784 Other hyperlipidemia: Secondary | ICD-10-CM

## 2017-04-22 DIAGNOSIS — Z48812 Encounter for surgical aftercare following surgery on the circulatory system: Secondary | ICD-10-CM | POA: Diagnosis not present

## 2017-04-22 DIAGNOSIS — E7849 Other hyperlipidemia: Secondary | ICD-10-CM

## 2017-04-22 DIAGNOSIS — I739 Peripheral vascular disease, unspecified: Secondary | ICD-10-CM | POA: Diagnosis not present

## 2017-04-22 DIAGNOSIS — R0602 Shortness of breath: Secondary | ICD-10-CM

## 2017-04-22 NOTE — Progress Notes (Signed)
Office: [phone]  /  Fax: [phone]   Dear Lisa [name], [name],   Thank you for referring Lisa [name] to our clinic. The following note includes my evaluation and treatment recommendations.  HPI:   Chief Complaint: OBESITY  Lisa [name] has been referred by Lisa [name]. Lisa [name], [place] for consultation regarding her obesity and obesity related comorbidities.  Lisa [name] (MR# [id number]) is a 56 y.o. female who presents on 04/22/2017 for obesity evaluation and treatment. Current BMI is Body mass index is 45.1 kg/m.Marland Kitchen Lisa [name] has struggled with obesity for years and has been unsuccessful in either losing weight or maintaining long term weight loss. Lisa [name] attended our information session and states she is currently in the action stage of change and ready to dedicate time achieving and maintaining a healthier weight.  Lisa [name] states her family eats meals together she thinks her family will eat healthier with  her her desired weight loss is 89 to 94 lbs she started gaining weight after having babies her heaviest weight ever was 292 lbs. she is a picky eater and doesn't like to eat healthier foods  she has significant food cravings issues  she snacks frequently in the evenings she skips meals frequently she struggles with emotional eating    Fatigue Lisa [name] feels her energy is lower than it should be. This has worsened with weight gain and has not worsened recently. Lisa [name] admits to daytime somnolence and  denies waking up still tired. Patient is at risk for obstructive sleep apnea. Patent has a history of symptoms of daytime fatigue, morning headache and hypertension. Patient generally gets 7 or 8 hours of sleep per night, and states they generally have restful sleep. Snoring is not present. Apneic episodes are not present. Epworth Sleepiness Score is 5  Dyspnea on exertion Lisa [name] notes increasing shortness of breath with exercising and seems to be worsening over time with weight  gain. She notes getting out of breath sooner with activity than she used to. This has not gotten worse recently. Lisa [name] denies orthopnea.  Diabetes II with complications Lisa [name] has a diagnosis of diabetes type II. [name] states BGs range between 126 and 163, she recently started Metformin, but is having significant GI upset, so stopped taking today. She has been working on intensive lifestyle modifications including diet, exercise, and weight loss to help control her blood glucose levels.  Hypertension Lisa [name] is a 56 y.o. female with hypertension. Her blood pressure is elevated today and she is on multiple medications. Lisa [name] denies chest pain or shortness at rest or orthopnea. She is working weight loss to help control her blood pressure with the goal of decreasing her risk of heart attack and stroke. Lisa [name] blood pressure is not currently controlled.  Congestive Heart Failure Lisa [name] has a diagnosis of congestive heart failure with Grade I diastolic dysfunction. She had a normal ejection fraction by echocardiogram in 2016. She denies chest pain, shortness of breath at rest or orthopnea.  Hyperlipidemia Lisa [name] has hyperlipidemia and is currently on statin. She has been trying to improve her cholesterol levels with intensive lifestyle modification including a low saturated fat diet, exercise and weight loss. She denies any chest pain, claudication or myalgias.  At risk for cardiovascular disease Lisa [name] is at a higher than average risk for cardiovascular disease due to obesity, hyperlipidemia, hypertension, congestive heart failure and diabetes. She currently denies any chest pain.   Depression Screen Lisa [name]'s Food and Mood (modified PHQ-9) score was  Depression screen  PHQ 2/9 04/22/2017  Decreased Interest 1  Down, Depressed, Hopeless 1  PHQ - 2 Score 2  Altered sleeping 0  Tired, decreased energy 3  Change in appetite 0  Feeling bad or failure about yourself  1  Trouble  concentrating 0  Moving slowly or fidgety/restless 0  PHQ-9 Score 6    ALLERGIES: Allergies  Allergen Reactions  . Ceftin [Cefuroxime] Other (See Comments)    Severe stomach pain - Diverticulitis flare up  . Guaifenesin & Derivatives Other (See Comments)    Restlessness, jittery  . Hctz [Hydrochlorothiazide] Other (See Comments)    Causes a headache  . Morphine And Related Other (See Comments)    Moodiness.  Causes anger.  . Naproxen Other (See Comments)    Insomnia  . Lisinopril Cough       . Percocet [Oxycodone-Acetaminophen] Other (See Comments)    MEDICATIONS: Current Outpatient Prescriptions on File Prior to Visit  Medication Sig Dispense Refill  . atorvastatin (LIPITOR) 40 MG tablet Take 1 tablet (40 mg total) by mouth daily. 90 tablet 3  . Blood Glucose Monitoring Suppl (ONE TOUCH ULTRA MINI) w/Device KIT Use meter to check blood sugars 1-4 times daily as instructed. 1 each 0  . buPROPion (WELLBUTRIN XL) 150 MG 24 hr tablet Take 150 mg by mouth daily.    . calcium carbonate (TUMS - DOSED IN MG ELEMENTAL CALCIUM) 500 MG chewable tablet Chew 3 tablets by mouth daily as needed for indigestion or heartburn.     . carvedilol (COREG) 25 MG tablet Take 1 tablet (25 mg total) by mouth 2 (two) times daily. 180 tablet 3  . dicyclomine (BENTYL) 10 MG capsule TAKE 1 CAPSULE (10 MG TOTAL) BY MOUTH 3 (THREE) TIMES DAILY AS NEEDED FOR SPASMS. 90 capsule 3  . doxazosin (CARDURA) 2 MG tablet Take 2 mg by mouth 2 (two) times daily.     Marland Kitchen glucose blood (ONE TOUCH ULTRA TEST) test strip Use one strip per test. Test blood sugars 1-4 times daily as instructed. 50 each 12  . ibuprofen (ADVIL,MOTRIN) 200 MG tablet Take 600 mg by mouth every 6 (six) hours as needed for moderate pain.    . Lancets Misc. (ONE TOUCH SURESOFT) MISC Use 1 lancet per test. Test blood sugars 1-4 times per day as instructed. 100 each 1  . losartan (COZAAR) 100 MG tablet Take 1 tablet (100 mg total) by mouth daily. 90  tablet 3  . omeprazole (PRILOSEC) 40 MG capsule TAKE 1 CAPSULE (40 MG TOTAL) BY MOUTH 2 (TWO) TIMES DAILY. 180 capsule 1  . ONE TOUCH LANCETS MISC Use to check blood sugars twice a day 200 each 1  . spironolactone (ALDACTONE) 50 MG tablet Take 1 tablet (50 mg total) by mouth daily. 90 tablet 2  . metFORMIN (GLUCOPHAGE XR) 500 MG 24 hr tablet Take 1 tablet (500 mg total) by mouth daily with breakfast. (Patient not taking: Reported on 04/22/2017) 30 tablet 1   No current facility-administered medications on file prior to visit.     PAST MEDICAL HISTORY: Past Medical History:  Diagnosis Date  . Allergic rhinitis   . Asthma   . Breast nodule    right breast, to see dr Helane Rima 06-20-2012 for   . Chest pain, non-cardiac   . Chronic headaches   . Cigarette smoker   . Complication of anesthesia 05-17-2012   trouble breathing after colonscopy, needed nebulizer  . Diverticulosis   . GERD (gastroesophageal reflux disease)   . Hepatic  hemangioma   . Hypertension   . IBS (irritable bowel syndrome)   . Obesity   . Pyloric erosion   . Sleep apnea    CPAP setting varies from 4-10  . Type 2 diabetes mellitus ([place]) 03/11/2017    PAST SURGICAL HISTORY: Past Surgical History:  Procedure Laterality Date  . CARDIAC CATHETERIZATION  12/28/2007   EF 75%. IT REVEALS NORMAL/SUPRANORMAL LEFT VENTRICULAR SYSTOLIC FUNCTION  . CARDIOVASCULAR STRESS TEST  03/25/2007   EF 78%  . CESAREAN SECTION  2003, 2009   x 2  . CHOLECYSTECTOMY  1993  . COLONOSCOPY  2014  . ENDOMETRIAL ABLATION  09/2010   and D&C  . HERNIA REPAIR  9449   umbilical  . LAPAROSCOPY    . TUBAL LIGATION    . UMBILICAL HERNIA REPAIR  2004  . US ECHOCARDIOGRAPHY  05/08/2008   EF 55-60%  . VENTRAL HERNIA REPAIR  06/15/2012   Procedure: LAPAROSCOPIC VENTRAL HERNIA;  Surgeon: Edward Jolly, MD;  Location: [facility];  Service: General;  Laterality: N/A;  LAPAROSCOPIC REPAIR VENTRAL HERNIA    SOCIAL HISTORY: Social History  Substance Use  Topics  . Smoking status: Current Every Day Smoker    Packs/day: 0.50    Years: 25.00    Types: Cigarettes    Last attempt to quit: 09/18/2016  . Smokeless tobacco: Never Used  . Alcohol use 0.0 oz/week     Comment: very rare    FAMILY HISTORY: Family History  Problem Relation Age of Onset  . Hypertension Mother   . Lung cancer Mother   . Diabetes Maternal Grandmother   . Heart disease Father   . Heart attack Paternal Uncle   . Breast cancer Unknown   . Colon cancer Neg Hx     ROS: Review of Systems  Constitutional: Positive for malaise/fatigue.  HENT:       Ringing in the the ears (left)  Eyes: Positive for blurred vision.       Vision Changes Wear Glasses or Contacts Flashes of Light  Respiratory: Positive for cough, shortness of breath (on exertion) and wheezing ("every now and then").   Cardiovascular: Negative for chest pain, orthopnea and claudication.       Calf/Leg Pain with Walking Leg Cramping Negative shortness of breath at rest  Gastrointestinal: Positive for diarrhea and nausea.  Genitourinary: Positive for frequency ("due to drinking lots of H2O").  Musculoskeletal: Negative for myalgias.  Skin: Positive for itching.       dryness  Neurological: Positive for headaches.    PHYSICAL EXAM: Blood pressure (!) 144/84, pulse 68, temperature 98.9 F (37.2 C), temperature source Oral, resp. rate 20, height _0  (1.651 m), weight 271 lb (122.9 kg), SpO2 98 %. Body mass index is 45.1 kg/m. Physical Exam  Constitutional: She is oriented to person, place, and time. She appears well-developed and well-nourished.  Cardiovascular: Normal rate.   Pulmonary/Chest: Effort normal.  Abdominal: Soft.  Rectus Abdominis Diastasis  Musculoskeletal: She exhibits edema (+1 edema in right lower leg).  Neurological: She is oriented to person, place, and time.  Skin: Skin is warm and dry.  Psychiatric: She has a normal mood and affect. Her behavior is normal.  Vitals  reviewed.   RECENT LABS AND TESTS: BMET    Component Value Date/Time   NA 140 02/18/2017 1201   K 4.0 02/18/2017 1201   CL 104 02/18/2017 1201   CO2 25 02/18/2017 1201   GLUCOSE 126 (H) 02/18/2017 1201   BUN  16 02/18/2017 1201   CREATININE 0.98 02/18/2017 1201   CREATININE 1.08 (H) 11/12/2016 1040   CALCIUM 10.5 (H) 02/18/2017 1201   GFRNONAA >60 02/18/2017 1201   GFRAA >60 02/18/2017 1201   Lab Results  Component Value Date   HGBA1C 7.7 (H) 03/11/2017   No results found for: INSULIN CBC    Component Value Date/Time   WBC 8.1 02/18/2017 1201   RBC 5.08 02/18/2017 1201   HGB 15.6 (H) 02/18/2017 1201   HCT 45.6 02/18/2017 1201   PLT 252 02/18/2017 1201   MCV 89.8 02/18/2017 1201   MCH 30.7 02/18/2017 1201   MCHC 34.2 02/18/2017 1201   RDW 13.6 02/18/2017 1201   LYMPHSABS 2.2 02/28/2016 1414   MONOABS 0.6 02/28/2016 1414   EOSABS 0.2 02/28/2016 1414   BASOSABS 0.0 02/28/2016 1414   Iron/TIBC/Ferritin/ %Sat No results found for: IRON, TIBC, FERRITIN, IRONPCTSAT Lipid Panel     Component Value Date/Time   CHOL 215 (H) 05/26/2016 1134   TRIG 130.0 05/26/2016 1134   HDL 34.50 (L) 05/26/2016 1134   CHOLHDL 6 05/26/2016 1134   VLDL 26.0 05/26/2016 1134   LDLCALC 155 (H) 05/26/2016 1134   Hepatic Function Panel     ECG  shows NSR with a rate of 66 BPM INDIRECT CALORIMETER done today shows a VO2 of 290 and a REE of 2018.    ASSESSMENT AND PLAN: Other fatigue - Plan: EKG 12-Lead, CBC With Differential, Folate, T3, T4, free, TSH, VITAMIN D 25 Hydroxy (Vit-D Deficiency, Fractures), Vitamin B12  Shortness of breath on exertion  Type 2 diabetes mellitus with hyperglycemia, without long-term current use of insulin (HCC) - Plan: Comprehensive metabolic panel, Hemoglobin A1c, Insulin, random, Microalbumin / creatinine urine ratio  Essential hypertension  Diastolic congestive heart failure, unspecified HF chronicity (HCC)  Other hyperlipidemia - Plan: Lipid Panel  With LDL/HDL Ratio  Depression screening  At risk for heart disease  Morbid obesity ([place])  PLAN: Fatigue Michaelyn was informed that her fatigue may be related to obesity, depression or many other causes. Labs will be ordered, and in the meanwhile Alajia has agreed to work on diet, exercise and weight loss to help with fatigue. Proper sleep hygiene was discussed including the need for 7-8 hours of quality sleep each night. A sleep study was not ordered based on symptoms and Epworth score.  Dyspnea on exertion Marrissa's shortness of breath appears to be obesity related and exercise induced. She has agreed to work on weight loss and gradually increase exercise to treat her exercise induced shortness of breath. If Florella follows our instructions and loses weight without improvement of her shortness of breath, we will plan to refer to pulmonology. We will monitor this condition regularly. Luca agrees to this plan.  Diabetes II with complications Niva has been given extensive diabetes education by myself today including ideal fasting and post-prandial blood glucose readings, individual ideal Hgb A1c goals  and hypoglycemia prevention. We discussed the importance of good blood sugar control to decrease the likelihood of diabetic complications such as nephropathy, neuropathy, limb loss, blindness, coronary artery disease, and death. We discussed the importance of intensive lifestyle modification including diet, exercise and weight loss as the first line treatment for diabetes. Lyndie advised okay to hold Metformin. She agrees to work on diet, exercise and weight loss and will follow up at the agreed upon time. We will check labs and will adjust medications after lab result discussed at next visit.  Hypertension We discussed sodium restriction,  working on healthy weight loss, and a regular exercise program as the means to achieve improved blood pressure control. Lynelle agreed with this plan and agreed to follow  up as directed. We will check labs and will re-check blood pressure in 2 weeks. We will continue to monitor her blood pressure as well as her progress with the above lifestyle modifications. She will continue her medications as prescribed and will watch for signs of hypotension as she continues her lifestyle modifications.  Congestive Heart Failure Dorien agrees to work on diet, exercise and weight loss. She agrees to decrease sodium and follow up with our clinic in 2 weeks.  Hyperlipidemia Lauryl was informed of the American Heart Association Guidelines emphasizing intensive lifestyle modifications as the first line treatment for hyperlipidemia. We discussed many lifestyle modifications today in depth, and Myeesha will continue to work on decreasing saturated fats such as fatty red meat, butter and many fried foods. She will also increase vegetables and lean protein in her diet and continue to work on exercise and weight loss efforts. We will check labs and Oyuki agrees to follow up at agreed upon time.  Cardiovascular risk counselling Hajira was given extended (at least 15 minutes) coronary artery disease prevention counseling today. She is 56 y.o. female and has risk factors for heart disease including obesity, hyperlipidemia, hypertension, congestive heart failure and diabetes. We discussed intensive lifestyle modifications today with an emphasis on specific weight loss instructions and strategies. Pt was also informed of the importance of increasing exercise and decreasing saturated fats to help prevent heart disease.  Depression Screen Heidemarie had a mildly positive depression screening. Depression is commonly associated with obesity and often results in emotional eating behaviors. We will monitor this closely and work on CBT to help improve the non-hunger eating patterns. Referral to Psychology may be required if no improvement is seen as she continues in our clinic.  Obesity Sakiya is currently in the  action stage of change and her goal is to continue with weight loss efforts. I recommend Dinara begin the structured treatment plan as follows:  She has agreed to follow the Category 3 plan Islay has been instructed to eventually work up to a goal of 150 minutes of combined cardio and strengthening exercise per week for weight loss and overall health benefits. We discussed the following Behavioral Modification Strategies today: increasing lean protein intake, decrease eating out and work on meal planning and easy cooking plans  Due to apparent resistance to making dietary changes recommended, I question whether Marielouise is ready for the action stage of change.  Capucine has agreed to join our obesity program and follow up with our clinic in 2 weeks. She was informed of the importance of frequent follow up visits to maximize her success with intensive lifestyle modifications for her multiple health conditions. She was informed we would discuss her lab results at her next visit unless there is a critical issue that needs to be addressed sooner. [place] agreed to keep her next visit at the agreed upon time to discuss these results.  I, Doreene Nest, am acting as scribe for Dennard Nip, MD  I have reviewed the above documentation for accuracy and completeness, and I agree with the above. -Dennard Nip, MD   OBESITY BEHAVIORAL INTERVENTION VISIT  Today's visit was # 1 out of 22.  Starting weight: 271 lbs Starting date: 04/22/17 Todays weight : 271 lbs Total lbs lost to date: 0 (Patients must lose 7 lbs in the first 6  months to continue with counseling)   ASK: We discussed the diagnosis of obesity with Lisa [name] today and Hiroko agreed to give [name] permission to discuss obesity behavioral modification therapy today.  ASSESS: Anadia has the diagnosis of obesity and her BMI today is 73.2 Laelynn is in the action stage of change   ADVISE: Yennifer was educated on the multiple health risks of obesity  as well as the benefit of weight loss to improve her health. She was advised of the need for long term treatment and the importance of lifestyle modifications.  AGREE: Multiple dietary modification options and treatment options were discussed and  Yisell agreed to follow the Category 3 plan We discussed the following Behavioral Modification Strategies today: increasing lean protein intake, decrease eating out and work on meal planning and easy cooking plans

## 2017-04-22 NOTE — Progress Notes (Signed)
Patient is a 57 year old female who returns for follow-up today. She was originally seen 03/11/2017. She has symptoms of exertional fatigue in both lower extremities that occurs at about one half block of walking. This is not improved from her last office visit. She did have an MRI of the spine which showed some disc disease L5-S1 L4-L5 but primarily compromising the left leg which does not really explain her right leg symptoms. She has also been told that some of her problems may be related to her obesity and degenerative joint disease. This certainly may be a contributor as well. Her recent ABIs were 0.99 on the right 0.94 on the left but were biphasic rather than triphasic. She denies rest pain. She has no history of nonhealing wounds.  Review of systems: She has shortness of breath with exertion. She denies chest pain.  Physical exam:  Vitals:   04/22/17 1359  BP: 109/64  Pulse: 82  Resp: 16  Temp: 98.5 F (36.9 C)  SpO2: 96%  Weight: 271 lb (122.9 kg)  Height: 5' 5.5" (1.664 m)    Extremities: Absent dorsalis pedis and posterior tibial pulses bilaterally  Skin: No ulceration or open wound  Assessment: Patient with lower extremity weakness pain consistent with multifactorial issues but need to rule out arterial occlusive disease of the contributor of this. Since her MRI findings did not really show an explanation for right leg symptoms I believe she warrants an arteriogram.  Plan: Aortogram bilateral lower extremity runoff possible intervention scheduled for 04/30/2017. Risks benefits possible complications and procedure details were discussed with the patient today including but limited to bleeding infection vessel injury contrast reaction. She understands and agrees to proceed.  Ruta Hinds, MD [facility] Office: [phone] Pager: [phone]

## 2017-04-23 LAB — MICROALBUMIN / CREATININE URINE RATIO
Creatinine, Urine: 102.1 mg/dL
Microalb/Creat Ratio: 28.6 mg/g creat (ref 0.0–30.0)
Microalbumin, Urine: 29.2 ug/mL

## 2017-04-27 LAB — LIPID PANEL WITH LDL/HDL RATIO
CHOLESTEROL TOTAL: 130 mg/dL (ref 100–199)
HDL: 25 mg/dL — AB (ref >39)
LDL CALC: 77 mg/dL (ref 0–99)
LDL/HDL RATIO: 3.1 ratio (ref 0.0–3.2)
TRIGLYCERIDES: 141 mg/dL (ref 0–149)
VLDL Cholesterol Cal: 28 mg/dL (ref 5–40)

## 2017-04-27 LAB — COMPREHENSIVE METABOLIC PANEL
A/G RATIO: 1.3 (ref 1.2–2.2)
ALBUMIN: 4.3 g/dL (ref 3.5–5.5)
ALT: 15 IU/L (ref 0–32)
AST: 16 IU/L (ref 0–40)
Alkaline Phosphatase: 87 IU/L (ref 39–117)
BUN/Creatinine Ratio: 19 (ref 9–23)
BUN: 19 mg/dL (ref 6–24)
Bilirubin Total: 0.4 mg/dL (ref 0.0–1.2)
CALCIUM: 9.8 mg/dL (ref 8.7–10.2)
CO2: 19 mmol/L (ref 18–29)
CREATININE: 1.01 mg/dL — AB (ref 0.57–1.00)
Chloride: 102 mmol/L (ref 96–106)
GFR, EST AFRICAN AMERICAN: 73 mL/min/{1.73_m2} (ref >59)
GFR, EST NON AFRICAN AMERICAN: 64 mL/min/{1.73_m2} (ref >59)
GLOBULIN, TOTAL: 3.2 g/dL (ref 1.5–4.5)
Glucose: 101 mg/dL — ABNORMAL HIGH (ref 65–99)
POTASSIUM: 4 mmol/L (ref 3.5–5.2)
SODIUM: 138 mmol/L (ref 134–144)
TOTAL PROTEIN: 7.5 g/dL (ref 6.0–8.5)

## 2017-04-27 LAB — HEMOGLOBIN A1C
Est. average glucose Bld gHb Est-mCnc: 154 mg/dL
Hgb A1c MFr Bld: 7 % — ABNORMAL HIGH (ref 4.8–5.6)

## 2017-04-27 LAB — CBC WITH DIFFERENTIAL
BASOS: 0 %
Basophils Absolute: 0 10*3/uL (ref 0.0–0.2)
EOS (ABSOLUTE): 0.1 10*3/uL (ref 0.0–0.4)
EOS: 2 %
HEMATOCRIT: 42.3 % (ref 34.0–46.6)
HEMOGLOBIN: 14.3 g/dL (ref 11.1–15.9)
Immature Grans (Abs): 0.1 10*3/uL (ref 0.0–0.1)
Immature Granulocytes: 1 %
Lymphocytes Absolute: 2.5 10*3/uL (ref 0.7–3.1)
Lymphs: 34 %
MCH: 30.4 pg (ref 26.6–33.0)
MCHC: 33.8 g/dL (ref 31.5–35.7)
MCV: 90 fL (ref 79–97)
MONOCYTES: 5 %
MONOS ABS: 0.4 10*3/uL (ref 0.1–0.9)
Neutrophils Absolute: 4.3 10*3/uL (ref 1.4–7.0)
Neutrophils: 58 %
RBC: 4.7 x10E6/uL (ref 3.77–5.28)
RDW: 13.9 % (ref 12.3–15.4)
WBC: 7.4 10*3/uL (ref 3.4–10.8)

## 2017-04-27 LAB — T3: T3, Total: 121 ng/dL (ref 71–180)

## 2017-04-27 LAB — T4, FREE: Free T4: 1.51 ng/dL (ref 0.82–1.77)

## 2017-04-27 LAB — VITAMIN B12: Vitamin B-12: 729 pg/mL (ref 232–1245)

## 2017-04-27 LAB — TSH: TSH: 1.04 u[IU]/mL (ref 0.450–4.500)

## 2017-04-27 LAB — FOLATE: Folate: 19.8 ng/mL (ref >3.0)

## 2017-04-27 LAB — VITAMIN D 25 HYDROXY (VIT D DEFICIENCY, FRACTURES): VIT D 25 HYDROXY: 40.7 ng/mL (ref 30.0–100.0)

## 2017-04-27 LAB — INSULIN, RANDOM: INSULIN: 19.6 u[IU]/mL (ref 2.6–24.9)

## 2017-04-28 ENCOUNTER — Ambulatory Visit: Payer: Managed Care, Other (non HMO) | Admitting: *Deleted

## 2017-04-30 ENCOUNTER — Telehealth: Payer: Self-pay | Admitting: Vascular Surgery

## 2017-04-30 ENCOUNTER — Encounter (HOSPITAL_COMMUNITY): Payer: Self-pay | Admitting: *Deleted

## 2017-04-30 ENCOUNTER — Encounter (HOSPITAL_COMMUNITY)
Admission: RE | Disposition: A | Discharge status: Home / Self Care | Payer: Self-pay | Source: Ambulatory Visit | Attending: Vascular Surgery

## 2017-04-30 ENCOUNTER — Ambulatory Visit (HOSPITAL_COMMUNITY)
Admission: RE | Admit: 2017-04-30 | Discharge: 2017-04-30 | Disposition: A | Discharge status: Home / Self Care | Payer: Managed Care, Other (non HMO) | Source: Ambulatory Visit | Attending: Vascular Surgery | Admitting: Vascular Surgery

## 2017-04-30 DIAGNOSIS — M545 Low back pain: Secondary | ICD-10-CM | POA: Insufficient documentation

## 2017-04-30 DIAGNOSIS — I70212 Atherosclerosis of native arteries of extremities with intermittent claudication, left leg: Secondary | ICD-10-CM | POA: Diagnosis not present

## 2017-04-30 DIAGNOSIS — Z6841 Body Mass Index (BMI) 40.0 and over, adult: Secondary | ICD-10-CM | POA: Insufficient documentation

## 2017-04-30 DIAGNOSIS — E669 Obesity, unspecified: Secondary | ICD-10-CM | POA: Diagnosis not present

## 2017-04-30 DIAGNOSIS — I70213 Atherosclerosis of native arteries of extremities with intermittent claudication, bilateral legs: Secondary | ICD-10-CM | POA: Diagnosis not present

## 2017-04-30 DIAGNOSIS — I739 Peripheral vascular disease, unspecified: Secondary | ICD-10-CM | POA: Diagnosis present

## 2017-04-30 HISTORY — PX: ABDOMINAL AORTOGRAM W/LOWER EXTREMITY: CATH118223

## 2017-04-30 HISTORY — PX: PERIPHERAL VASCULAR BALLOON ANGIOPLASTY: CATH118281

## 2017-04-30 LAB — POCT I-STAT, CHEM 8
BUN: 36 mg/dL — ABNORMAL HIGH (ref 6–20)
Calcium, Ion: 1.37 mmol/L (ref 1.15–1.40)
Chloride: 107 mmol/L (ref 101–111)
Creatinine, Ser: 1.1 mg/dL — ABNORMAL HIGH (ref 0.44–1.00)
Glucose, Bld: 139 mg/dL — ABNORMAL HIGH (ref 65–99)
HEMATOCRIT: 42 % (ref 36.0–46.0)
HEMOGLOBIN: 14.3 g/dL (ref 12.0–15.0)
Potassium: 3.8 mmol/L (ref 3.5–5.1)
SODIUM: 141 mmol/L (ref 135–145)
TCO2: 25 mmol/L (ref 0–100)

## 2017-04-30 LAB — POCT ACTIVATED CLOTTING TIME
ACTIVATED CLOTTING TIME: 241 s
ACTIVATED CLOTTING TIME: 296 s
ACTIVATED CLOTTING TIME: 180 s

## 2017-04-30 SURGERY — ABDOMINAL AORTOGRAM W/LOWER EXTREMITY
Anesthesia: LOCAL

## 2017-04-30 MED ORDER — ASPIRIN 325 MG PO TABS
ORAL_TABLET | ORAL | Status: AC
  Filled 2017-04-30: qty 1

## 2017-04-30 MED ORDER — ACETAMINOPHEN 325 MG PO TABS: 325.0000 mg | ORAL_TABLET | ORAL | Status: DC | PRN

## 2017-04-30 MED ORDER — LIDOCAINE HCL (PF) 1 % IJ SOLN
INTRAMUSCULAR | Status: DC | PRN
  Administered 2017-04-30: 20 mL

## 2017-04-30 MED ORDER — OXYCODONE HCL 5 MG PO TABS
ORAL_TABLET | ORAL | Status: DC
Start: 2017-04-30 — End: 2017-04-30
  Filled 2017-04-30: qty 2

## 2017-04-30 MED ORDER — HEPARIN SODIUM (PORCINE) 1000 UNIT/ML IJ SOLN
INTRAMUSCULAR | Status: DC | PRN
  Administered 2017-04-30: 10000 [IU] via INTRAVENOUS

## 2017-04-30 MED ORDER — ASPIRIN 325 MG PO TBEC
325.0000 mg | DELAYED_RELEASE_TABLET | Freq: Every day | ORAL | 10 refills | Status: DC
Start: 2017-04-30 — End: 2017-04-30

## 2017-04-30 MED ORDER — FENTANYL CITRATE (PF) 100 MCG/2ML IJ SOLN
INTRAMUSCULAR | Status: DC | PRN
  Administered 2017-04-30: 25 ug via INTRAVENOUS

## 2017-04-30 MED ORDER — IODIXANOL 320 MG/ML IV SOLN
INTRAVENOUS | Status: DC | PRN
  Administered 2017-04-30: 145 mL via INTRA_ARTERIAL

## 2017-04-30 MED ORDER — CLOPIDOGREL BISULFATE 75 MG PO TABS: 75.0000 mg | ORAL_TABLET | Freq: Every day | ORAL | 11 refills | Status: DC

## 2017-04-30 MED ORDER — GUAIFENESIN-DM 100-10 MG/5ML PO SYRP
15.0000 mL | ORAL_SOLUTION | ORAL | Status: DC | PRN
  Filled 2017-04-30: qty 15

## 2017-04-30 MED ORDER — PHENOL 1.4 % MT LIQD
1.0000 | OROMUCOSAL | Status: DC | PRN
  Filled 2017-04-30: qty 177

## 2017-04-30 MED ORDER — HEPARIN (PORCINE) IN NACL 2-0.9 UNIT/ML-% IJ SOLN
INTRAMUSCULAR | Status: AC | PRN
  Administered 2017-04-30: 1000 mL

## 2017-04-30 MED ORDER — CLOPIDOGREL BISULFATE 300 MG PO TABS
300.0000 mg | ORAL_TABLET | Freq: Once | ORAL | Status: AC
  Administered 2017-04-30: 300 mg via ORAL

## 2017-04-30 MED ORDER — OXYCODONE HCL 5 MG PO TABS
5.0000 mg | ORAL_TABLET | ORAL | Status: DC | PRN
  Administered 2017-04-30: 10 mg via ORAL

## 2017-04-30 MED ORDER — ASPIRIN 325 MG PO TBEC: 325.0000 mg | DELAYED_RELEASE_TABLET | Freq: Every day | ORAL | 10 refills | Status: DC

## 2017-04-30 MED ORDER — HYDROMORPHONE HCL 1 MG/ML IJ SOLN: 1.0000 mg | Freq: Once | INTRAMUSCULAR | Status: DC

## 2017-04-30 MED ORDER — ACETAMINOPHEN 325 MG RE SUPP
325.0000 mg | RECTAL | Status: DC | PRN
  Filled 2017-04-30: qty 2

## 2017-04-30 MED ORDER — MIDAZOLAM HCL 2 MG/2ML IJ SOLN
INTRAMUSCULAR | Status: DC | PRN
  Administered 2017-04-30: 1 mg via INTRAVENOUS

## 2017-04-30 MED ORDER — HEPARIN (PORCINE) IN NACL 2-0.9 UNIT/ML-% IJ SOLN
INTRAMUSCULAR | Status: AC
  Filled 2017-04-30: qty 1000

## 2017-04-30 MED ORDER — DOCUSATE SODIUM 100 MG PO CAPS
100.0000 mg | ORAL_CAPSULE | Freq: Every day | ORAL | Status: DC
Start: 2017-05-01 — End: 2017-04-30

## 2017-04-30 MED ORDER — HEPARIN SODIUM (PORCINE) 1000 UNIT/ML IJ SOLN
INTRAMUSCULAR | Status: AC
  Filled 2017-04-30: qty 1

## 2017-04-30 MED ORDER — CLOPIDOGREL BISULFATE 300 MG PO TABS
ORAL_TABLET | ORAL | Status: AC
  Filled 2017-04-30: qty 1

## 2017-04-30 MED ORDER — CLOPIDOGREL BISULFATE 75 MG PO TABS: 75.0000 mg | ORAL_TABLET | Freq: Every day | ORAL | Status: DC

## 2017-04-30 MED ORDER — ONDANSETRON HCL 4 MG/2ML IJ SOLN: 4.0000 mg | Freq: Four times a day (QID) | INTRAMUSCULAR | Status: DC | PRN

## 2017-04-30 MED ORDER — ASPIRIN 325 MG PO TABS
325.0000 mg | ORAL_TABLET | Freq: Every day | ORAL | Status: DC
  Administered 2017-04-30: 325 mg via ORAL
  Filled 2017-04-30: qty 1

## 2017-04-30 MED ORDER — HYDRALAZINE HCL 20 MG/ML IJ SOLN: 5.0000 mg | INTRAMUSCULAR | Status: DC | PRN

## 2017-04-30 MED ORDER — MORPHINE SULFATE (PF) 10 MG/ML IV SOLN
2.0000 mg | INTRAVENOUS | Status: DC | PRN
  Administered 2017-04-30: 2 mg via INTRAVENOUS

## 2017-04-30 MED ORDER — SODIUM CHLORIDE 0.9 % IV SOLN
INTRAVENOUS | Status: DC
  Administered 2017-04-30: 07:00:00 via INTRAVENOUS

## 2017-04-30 MED ORDER — NITROGLYCERIN 0.4 MG SL SUBL
SUBLINGUAL_TABLET | SUBLINGUAL | Status: AC
  Administered 2017-04-30: 0.4 mg
  Filled 2017-04-30: qty 1

## 2017-04-30 MED ORDER — FENTANYL CITRATE (PF) 100 MCG/2ML IJ SOLN
INTRAMUSCULAR | Status: AC
  Filled 2017-04-30: qty 2

## 2017-04-30 MED ORDER — PANTOPRAZOLE SODIUM 40 MG PO TBEC
40.0000 mg | DELAYED_RELEASE_TABLET | Freq: Every day | ORAL | Status: DC
  Administered 2017-04-30: 40 mg via ORAL
  Filled 2017-04-30: qty 1

## 2017-04-30 MED ORDER — MORPHINE SULFATE (PF) 4 MG/ML IV SOLN
INTRAVENOUS | Status: AC
  Filled 2017-04-30: qty 1

## 2017-04-30 MED ORDER — LABETALOL HCL 5 MG/ML IV SOLN: 10.0000 mg | INTRAVENOUS | Status: DC | PRN

## 2017-04-30 MED ORDER — SODIUM CHLORIDE 0.45 % IV SOLN
INTRAVENOUS | Status: DC
  Administered 2017-04-30: 100 mL/h via INTRAVENOUS

## 2017-04-30 MED ORDER — ALUM & MAG HYDROXIDE-SIMETH 200-200-20 MG/5ML PO SUSP
15.0000 mL | ORAL | Status: DC | PRN
Start: 2017-04-30 — End: 2017-04-30
  Filled 2017-04-30: qty 30

## 2017-04-30 MED ORDER — MIDAZOLAM HCL 2 MG/2ML IJ SOLN
INTRAMUSCULAR | Status: AC
  Filled 2017-04-30: qty 2

## 2017-04-30 MED ORDER — METOPROLOL TARTRATE 5 MG/5ML IV SOLN: 2.0000 mg | INTRAVENOUS | Status: DC | PRN

## 2017-04-30 MED ORDER — HYDROMORPHONE HCL 1 MG/ML IJ SOLN
INTRAMUSCULAR | Status: AC
  Filled 2017-04-30: qty 1

## 2017-04-30 SURGICAL SUPPLY — 21 items
BALLN LUTONIX DCB 5X40X130 (BALLOONS) ×3
BALLN MUSTANG 5X20X135 (BALLOONS) ×3
BALLOON LUTONIX DCB 5X40X130 (BALLOONS) IMPLANT
BALLOON MUSTANG 5X20X135 (BALLOONS) IMPLANT
CATH ANGIO 5F PIGTAIL 65CM (CATHETERS) ×1 IMPLANT
CATH CROSS OVER TEMPO 5F (CATHETERS) ×1 IMPLANT
CATH TEMPO AQUA 5F 100CM (CATHETERS) ×1 IMPLANT
COVER PRB 48X5XTLSCP FOLD TPE (BAG) IMPLANT
COVER PROBE 5X48 (BAG) ×3
DEVICE CONTINUOUS FLUSH (MISCELLANEOUS) ×1 IMPLANT
GUIDEWIRE ANGLED .035X150CM (WIRE) ×1 IMPLANT
KIT ENCORE 26 ADVANTAGE (KITS) ×1 IMPLANT
KIT PV (KITS) ×3 IMPLANT
SHEATH PINNACLE 5F 10CM (SHEATH) ×1 IMPLANT
SHEATH PINNACLE ST 7F 45CM (SHEATH) ×1 IMPLANT
SYR MEDRAD MARK V 150ML (SYRINGE) ×3 IMPLANT
TRANSDUCER W/STOPCOCK (MISCELLANEOUS) ×3 IMPLANT
TRAY PV CATH (CUSTOM PROCEDURE TRAY) ×3 IMPLANT
WIRE AMPLATZ SS-J .035X180CM (WIRE) ×1 IMPLANT
WIRE HITORQ VERSACORE ST 145CM (WIRE) ×1 IMPLANT
WIRE VERSACORE LOC 115CM (WIRE) ×1 IMPLANT

## 2017-04-30 NOTE — Op Note (Addendum)
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 The patient was placed in supine position on the Angio table. Both groins were prepped and draped in usual sterile fashion. Ultrasound was used to identify the right common femoral artery. Local anesthesia was infiltrated over the right common femoral artery. An introducer needle was then used to cannulate the right common femoral artery without difficulty. An 035 versacore wire was then threaded up the abdominal aorta under fluoroscopic guidance. A 5 French sheath was placed over the guidewire in the right common femoral artery. This was thoroughly flushed with heparin saline. A 5 French pigtail catheter was then placed over the guidewire into the abdominal aorta and abdominal aortogram was obtained in AP projection. The left and right renal arteries are widely patent. The infrarenal abdominal aorta is widely patent. There is a 90% stenosis of the origin of the left internal iliac artery. The left external iliac artery is widely patent. The right external and internal iliac arteries are widely patent.  Next the pigtail catheter was pulled down just above the aortic bifurcation and lower extremity runoff views were obtained through the pigtail catheter.  In the right lower extremity, the right common femoral profunda femoris superficial femoral artery is patent. There is a 25% narrowing of the right  superficial femoral artery in its midportion. The right popliteal artery is widely patent. There is two-vessel runoff via the anterior and posterior tibial arteries. The peroneal artery is diminutive 2 absent.  In the left lower extremity, the left common femoral profunda femoris and superficial femoral arteries are patent. However, in the midportion of the left superficial femoral artery there is a 90% less than 1 cm length stenosis. The popliteal artery is patent. There is two-vessel runoff via anterior and posterior tibial arteries. The peroneal artery is absent 2 diminutive.  At this point it was decided to intervene on the left superficial femoral artery stenosis. The pectoral catheter was exchanged over a guidewire for a 5 [place] crossover catheter. I used this to selectively catheterize the left common iliac artery. I could not get the versacore wire to advance down the left iliac system. Therefore an 035 angled Glidewire was used to cross over the aortic bifurcation and down into the distal left external iliac artery. I then attempted to place the crossover catheter down into the distal left external iliac artery and exchanged this for the versacore wire. However, due to the patient's sleep aortic bifurcation the wire eventually came back up into the abdominal aorta. To get the wire straightened a 5 French straight long catheter was used and advanced over the wire and the versacore wire removed. The 5 [place] crossover catheter was then placed over an 035 Amplatz wire and again the left common iliac artery was selected and a Amplatz wire directed down into the mid left superficial femoral artery. The crossover catheter was then removed the 5 French sheath was removed and a 7 [place] destination sheath advanced over the Amplatz wire up and over the aortic bifurcation and into the proximal left  superficial femoral artery. The patient was given 10,000 units of intravenous heparin. An 035 versacore wire was  then advanced across the lesion. The stenosis was marked and the area angioplastied with a Mustang 5 mm x 2 mm balloon. This was inflated to 10 atm 30 seconds. This balloon was then removed and replaced with a 5 x 4 mm Lutonix drug coated balloon. This was inflated to 8 atm for 3 minutes. This was then deflated and a completion arteriogram was performed which showed 0 residual stenosis in the left superficial femoral artery and 2 vessel runoff via the posterior tibial and anterior tibial arteries. At this point the 7 French sheath was pulled back over the aortic bifurcation into the right hemipelvis. This was left in place to be pulled after the ACT is less than 175. The patient's final ACT for the procedure was 290. The patient tolerated procedure well and there were no complications. The patient was taken to the holding area in stable condition.  Operative management: Successful angioplasty of left mid superficial femoral artery stenosis. The patient will be scheduled for a follow-up office visit in 3 months. She will be placed on Plavix and aspirin.  Ruta Hinds, MD [facility] Office: [phone] Pager: [phone]

## 2017-04-30 NOTE — H&P (View-Only) (Signed)
Patient is a 56 year old female who returns for follow-up today. She was originally seen 03/11/2017. She has symptoms of exertional fatigue in both lower extremities that occurs at about one half block of walking. This is not improved from her last office visit. She did have an MRI of the spine which showed some disc disease L5-S1 L4-L5 but primarily compromising the left leg which does not really explain her right leg symptoms. She has also been told that some of her problems may be related to her obesity and degenerative joint disease. This certainly may be a contributor as well. Her recent ABIs were 0.99 on the right 0.94 on the left but were biphasic rather than triphasic. She denies rest pain. She has no history of nonhealing wounds.  Review of systems: She has shortness of breath with exertion. She denies chest pain.  Physical exam:  Vitals:   04/22/17 1359  BP: 109/64  Pulse: 82  Resp: 16  Temp: 98.5 F (36.9 C)  SpO2: 96%  Weight: 271 lb (122.9 kg)  Height: 5' 5.5" (1.664 m)    Extremities: Absent dorsalis pedis and posterior tibial pulses bilaterally  Skin: No ulceration or open wound  Assessment: Patient with lower extremity weakness pain consistent with multifactorial issues but need to rule out arterial occlusive disease of the contributor of this. Since her MRI findings did not really show an explanation for right leg symptoms I believe she warrants an arteriogram.  Plan: Aortogram bilateral lower extremity runoff possible intervention scheduled for 04/30/2017. Risks benefits possible complications and procedure details were discussed with the patient today including but limited to bleeding infection vessel injury contrast reaction. She understands and agrees to proceed.  Ruta Hinds, MD [facility] Office: [phone] Pager: [phone]

## 2017-04-30 NOTE — Progress Notes (Signed)
Notified Dr Oneida Alar that patient had episode of chest pain 4 out of 10, EKG done no changes from EKG on 5/24, 1 SL Ntg given, O2 applied, Ok to do procedure

## 2017-04-30 NOTE — Progress Notes (Signed)
Site area: right groin a 7 french long sheath was removed  Site Prior to Removal:  Level 0  Pressure Applied For 20 MINUTES    Besdrest Beginning at 1110am  Manual:   Yes.    Patient Status During Pull:  stable  Post Pull Groin Site:  Level 0  Post Pull Instructions Given:  Yes.    Post Pull Pulses Present:  Yes.    Dressing Applied:  Yes.    Comments:  VS remain stable

## 2017-04-30 NOTE — Interval H&P Note (Signed)
History and Physical Interval Note:  04/30/2017 7:21 AM  Lisa [name]  has presented today for surgery, with the diagnosis of pvd  The various methods of treatment have been discussed with the patient and family. After consideration of risks, benefits and other options for treatment, the patient has consented to  Procedure(s): Abdominal Aortogram w/Lower Extremity (N/A) as a surgical intervention .  The patient's history has been reviewed, patient examined, no change in status, stable for surgery.  I have reviewed the patient's chart and labs.  Questions were answered to the patient's satisfaction.     Ruta Hinds

## 2017-04-30 NOTE — Telephone Encounter (Signed)
-----   Message from Mena Goes, RN sent at 04/30/2017 10:11 AM EDT ----- Regarding: 3 months, labs and NP   ----- Message ----- From: Elam Dutch, MD Sent: 04/30/2017   9:51 AM To: [facility] Charge Pool  Procedure: Abdominal aortogram with bilateral lower extremity runoff angioplasty left superficial femoral artery (5 x 4 Lutonix)   The patient needs a follow-up office visit in 3 months. She can see Vinnie Level at that visit.  She will need bilat ABI and duplex left SFA. She will be placed on Plavix and aspirin.  Ruta Hinds, MD [facility] Office: [phone] Pager: [phone]

## 2017-04-30 NOTE — Telephone Encounter (Signed)
Sched appt 08/12/17; labs at 10:00 and MD at 11:00. Mailed appt letter.

## 2017-04-30 NOTE — Progress Notes (Addendum)
Client awakened crying with pain and states having 8/10 pelvic pain and 8/10 lower back pain; called Dr Oneida Alar and he was in procedure, called Samantha,PA at the office and then called Dr Early and Dr Early in to check client; client refused dilaudid and took oxycodone IR; right groin stable and soft, no bleeding or hematoma noted

## 2017-04-30 NOTE — Progress Notes (Addendum)
Patient began to c/o 4/10 chest pain/ tightness at 0636.  Vital signs were taken.  Patient was placed on oxygen 2L [place].  Another EKG was done.  Called and talked with Eliberto Ivory RN who works with Dr. Oneida Alar.  Patient was given 0.4mg  Nitroglycerin sublingual.  Patient stated that pain after five minutes was now 2/10.  Will continue to monitor the patient.   Patient states pain is 2/10 and is "getting better".  Dr. Oneida Alar requested patient to be brought to cath lab for scheduled procedure.

## 2017-04-30 NOTE — Discharge Instructions (Signed)
NO GLUCOPHAGE/METFORMIN FOR 2 DAYS   Femoral Site Care Refer to this sheet in the next few weeks. These instructions provide you with information about caring for yourself after your procedure. Your health care provider may also give you more specific instructions. Your treatment has been planned according to current medical practices, but problems sometimes occur. Call your health care provider if you have any problems or questions after your procedure. What can I expect after the procedure? After your procedure, it is typical to have the following:  Bruising at the site that usually fades within 1-2 weeks.  Blood collecting in the tissue (hematoma) that may be painful to the touch. It should usually decrease in size and tenderness within 1-2 weeks.  Follow these instructions at home:  Take medicines only as directed by your health care provider.  You may shower 24-48 hours after the procedure or as directed by your health care provider. Remove the bandage (dressing) and gently wash the site with plain soap and water. Pat the area dry with a clean towel. Do not rub the site, because this may cause bleeding.  Do not take baths, swim, or use a hot tub until your health care provider approves.  Check your insertion site every day for redness, swelling, or drainage.  Do not apply powder or lotion to the site.  Limit use of stairs to twice a day for the first 2-3 days or as directed by your health care provider.  Do not squat for the first 2-3 days or as directed by your health care provider.  Do not lift over 10 lb (4.5 kg) for 5 days after your procedure or as directed by your health care provider.  Ask your health care provider when it is okay to: ? Return to work or school. ? Resume usual physical activities or sports. ? Resume sexual activity.  Do not drive home if you are discharged the same day as the procedure. Have someone else drive you.  You may drive 24 hours after the  procedure unless otherwise instructed by your health care provider.  Do not operate machinery or power tools for 24 hours after the procedure or as directed by your health care provider.  If your procedure was done as an outpatient procedure, which means that you went home the same day as your procedure, a responsible adult should be with you for the first 24 hours after you arrive home.  Keep all follow-up visits as directed by your health care provider. This is important. Contact a health care provider if:  You have a fever.  You have chills.  You have increased bleeding from the site. Hold pressure on the site. Get help right away if:  You have unusual pain at the site.  You have redness, warmth, or swelling at the site.  You have drainage (other than a small amount of blood on the dressing) from the site.  The site is bleeding, and the bleeding does not stop after 30 minutes of holding steady pressure on the site.  Your leg or foot becomes pale, cool, tingly, or numb. This information is not intended to replace advice given to you by your health care provider. Make sure you discuss any questions you have with your health care provider. Document Released: 07/20/2014 Document Revised: 04/23/2016 Document Reviewed: 06/05/2014 Elsevier Interactive Patient Education  [employer].

## 2017-05-04 ENCOUNTER — Ambulatory Visit (INDEPENDENT_AMBULATORY_CARE_PROVIDER_SITE_OTHER): Payer: Managed Care, Other (non HMO) | Admitting: Family Medicine

## 2017-05-06 ENCOUNTER — Ambulatory Visit (INDEPENDENT_AMBULATORY_CARE_PROVIDER_SITE_OTHER): Payer: Managed Care, Other (non HMO) | Admitting: Family Medicine

## 2017-05-06 ENCOUNTER — Other Ambulatory Visit: Payer: Self-pay

## 2017-05-06 VITALS — BP 120/74 | HR 64 | Temp 98.1°F | Ht 65.0 in | Wt 267.0 lb

## 2017-05-06 DIAGNOSIS — E784 Other hyperlipidemia: Secondary | ICD-10-CM

## 2017-05-06 DIAGNOSIS — E119 Type 2 diabetes mellitus without complications: Secondary | ICD-10-CM | POA: Diagnosis not present

## 2017-05-06 DIAGNOSIS — E7849 Other hyperlipidemia: Secondary | ICD-10-CM

## 2017-05-06 MED ORDER — LIRAGLUTIDE 18 MG/3ML ~~LOC~~ SOPN: 0.6000 mg | PEN_INJECTOR | Freq: Every morning | SUBCUTANEOUS | 0 refills | Status: DC

## 2017-05-06 MED ORDER — GLUCOSE BLOOD VI STRP: ORAL_STRIP | 0 refills | Status: DC

## 2017-05-06 MED ORDER — INSULIN PEN NEEDLE 32G X 4 MM MISC: 1.0000 | Freq: Every morning | 0 refills | Status: DC

## 2017-05-06 MED ORDER — METFORMIN HCL ER 500 MG PO TB24: 500.0000 mg | ORAL_TABLET | Freq: Every day | ORAL | 1 refills | Status: DC

## 2017-05-06 NOTE — Progress Notes (Signed)
Office: [phone]  /  Fax: [phone]   HPI:   Chief Complaint: OBESITY Lisa [name] is here to discuss her progress with her obesity treatment plan. She is on the  follow the Category 3 plan and is following her eating plan approximately 99 % of the time. She states she is walking 30 minutes 5 times per week. Daziyah did well with weight loss but struggled with hunger and snacking. She also struggled to eat all her food. Her weight is 267 lb (121.1 kg) today and has had a weight loss of 4 pounds over a period of 2 weeks since her last visit. She has lost 4 lbs since starting treatment with [name].  Diabetes II Nahla has a diagnosis of diabetes type II. Trudy stopped her metformin and blood sugars increased to between 130 and 160's. Last A1c was at 7.0 She has been working on intensive lifestyle modifications including diet, exercise, and weight loss to help control her blood glucose levels. She did well on her diabetic diet prescription.  Hyperlipidemia Jayanna has hyperlipidemia and is on statin. She has been trying to improve her cholesterol levels with intensive lifestyle modification including a low saturated fat diet, exercise and weight loss. She denies any chest pain, claudication or myalgias.  ALLERGIES: Allergies  Allergen Reactions  . Ceftin [Cefuroxime] Other (See Comments)    Severe stomach pain - Diverticulitis flare up  . Guaifenesin & Derivatives Other (See Comments)    Restlessness, jittery  . Hctz [Hydrochlorothiazide] Other (See Comments)    Causes a headache  . Morphine And Related Other (See Comments)    Moodiness.  Causes anger.  . Naproxen Other (See Comments)    Insomnia  . Lisinopril Cough         MEDICATIONS: Current Outpatient Prescriptions on File Prior to Visit  Medication Sig Dispense Refill  . aspirin 325 MG EC tablet Take 1 tablet (325 mg total) by mouth daily. 30 tablet 10  . atorvastatin (LIPITOR) 40 MG tablet Take 1 tablet (40 mg total) by mouth  daily. 90 tablet 3  . Blood Glucose Monitoring Suppl (ONE TOUCH ULTRA MINI) w/Device KIT Use meter to check blood sugars 1-4 times daily as instructed. 1 each 0  . buPROPion (WELLBUTRIN SR) 150 MG 12 hr tablet TAKE 1 TABLET (150 MG TOTAL) BY MOUTH DAILY  0  . carvedilol (COREG) 25 MG tablet Take 1 tablet (25 mg total) by mouth 2 (two) times daily. 180 tablet 3  . clopidogrel (PLAVIX) 75 MG tablet Take 1 tablet (75 mg total) by mouth daily. 30 tablet 11  . dicyclomine (BENTYL) 10 MG capsule TAKE 1 CAPSULE (10 MG TOTAL) BY MOUTH 3 (THREE) TIMES DAILY AS NEEDED FOR SPASMS. 90 capsule 3  . doxazosin (CARDURA) 2 MG tablet Take 2 mg by mouth 2 (two) times daily.     . fluticasone (FLONASE) 50 MCG/ACT nasal spray Place 2 sprays into both nostrils daily as needed for allergies or rhinitis.    Marland Kitchen glucose blood (ONE TOUCH ULTRA TEST) test strip Use one strip per test. Test blood sugars 1-4 times daily as instructed. 50 each 12  . ibuprofen (ADVIL,MOTRIN) 200 MG tablet Take 400-600 mg by mouth 2 (two) times daily as needed for moderate pain.     . Lancets Misc. (ONE TOUCH SURESOFT) MISC Use 1 lancet per test. Test blood sugars 1-4 times per day as instructed. 100 each 1  . losartan (COZAAR) 100 MG tablet Take 1 tablet (100 mg total) by  mouth daily. 90 tablet 3  . Multiple Vitamin (MULTIVITAMIN WITH MINERALS) TABS tablet Take 1 tablet by mouth daily.    Marland Kitchen omeprazole (PRILOSEC) 40 MG capsule TAKE 1 CAPSULE (40 MG TOTAL) BY MOUTH 2 (TWO) TIMES DAILY. (Patient taking differently: Take 40 mg by mouth daily. ) 180 capsule 1  . ONE TOUCH LANCETS MISC Use to check blood sugars twice a day 200 each 1  . spironolactone (ALDACTONE) 50 MG tablet Take 1 tablet (50 mg total) by mouth daily. 90 tablet 2  . calcium carbonate (TUMS - DOSED IN MG ELEMENTAL CALCIUM) 500 MG chewable tablet Chew 3 tablets by mouth daily as needed for indigestion or heartburn.      No current facility-administered medications on file prior to  visit.     PAST MEDICAL HISTORY: Past Medical History:  Diagnosis Date  . Allergic rhinitis   . Asthma   . Breast nodule    right breast, to see dr Helane Rima 06-20-2012 for   . Chest pain, non-cardiac   . Chronic headaches   . Cigarette smoker   . Complication of anesthesia 05-17-2012   trouble breathing after colonscopy, needed nebulizer  . Diverticulosis   . GERD (gastroesophageal reflux disease)   . Hepatic hemangioma   . Hypertension   . IBS (irritable bowel syndrome)   . Obesity   . Pyloric erosion   . Sleep apnea    CPAP setting varies from 4-10  . Type 2 diabetes mellitus ([place]) 03/11/2017    PAST SURGICAL HISTORY: Past Surgical History:  Procedure Laterality Date  . ABDOMINAL AORTOGRAM W/LOWER EXTREMITY N/A 04/30/2017   Procedure: Abdominal Aortogram w/Lower Extremity;  Surgeon: Elam Dutch, MD;  Location: [facility];  Service: Cardiovascular;  Laterality: N/A;  . CARDIAC CATHETERIZATION  12/28/2007   EF 75%. IT REVEALS NORMAL/SUPRANORMAL LEFT VENTRICULAR SYSTOLIC FUNCTION  . CARDIOVASCULAR STRESS TEST  03/25/2007   EF 78%  . CESAREAN SECTION  2003, 2009   x 2  . CHOLECYSTECTOMY  1993  . COLONOSCOPY  2014  . ENDOMETRIAL ABLATION  09/2010   and D&C  . HERNIA REPAIR  5993   umbilical  . LAPAROSCOPY    . PERIPHERAL VASCULAR BALLOON ANGIOPLASTY Left 04/30/2017   Procedure: Peripheral Vascular Balloon Angioplasty;  Surgeon: Elam Dutch, MD;  Location: [facility];  Service: Cardiovascular;  Laterality: Left;  SFA  . TUBAL LIGATION    . UMBILICAL HERNIA REPAIR  2004  . US ECHOCARDIOGRAPHY  05/08/2008   EF 55-60%  . VENTRAL HERNIA REPAIR  06/15/2012   Procedure: LAPAROSCOPIC VENTRAL HERNIA;  Surgeon: Edward Jolly, MD;  Location: [facility];  Service: General;  Laterality: N/A;  LAPAROSCOPIC REPAIR VENTRAL HERNIA    SOCIAL HISTORY: Social History  Substance Use Topics  . Smoking status: Current Every Day Smoker    Packs/day: 0.50    Years:  25.00    Types: Cigarettes    Last attempt to quit: 09/18/2016  . Smokeless tobacco: Never Used  . Alcohol use 0.0 oz/week     Comment: very rare    FAMILY HISTORY: Family History  Problem Relation Age of Onset  . Hypertension Mother   . Lung cancer Mother   . Diabetes Maternal Grandmother   . Heart disease Father   . Heart attack Paternal Uncle   . Breast cancer Unknown   . Colon cancer Neg Hx     ROS: Review of Systems  Constitutional: Positive for weight loss.  Cardiovascular:  Negative for chest pain and claudication.  Musculoskeletal: Negative for myalgias.    PHYSICAL EXAM: Blood pressure 120/74, pulse 64, temperature 98.1 F (36.7 C), temperature source Oral, height 5' 5" (1.651 m), weight 267 lb (121.1 kg), SpO2 99 %. Body mass index is 44.43 kg/m. Physical Exam  Constitutional: She is oriented to person, place, and time. She appears well-developed and well-nourished.  Cardiovascular: Normal rate.   Pulmonary/Chest: Effort normal.  Musculoskeletal: Normal range of motion.  Neurological: She is oriented to person, place, and time.  Skin: Skin is warm and dry.  Psychiatric: She has a normal mood and affect. Her behavior is normal.  Vitals reviewed.   RECENT LABS AND TESTS: BMET    Component Value Date/Time   NA 141 04/30/2017 0628   NA 138 04/22/2017 1145   K 3.8 04/30/2017 0628   CL 107 04/30/2017 0628   CO2 19 04/22/2017 1145   GLUCOSE 139 (H) 04/30/2017 0628   BUN 36 (H) 04/30/2017 0628   BUN 19 04/22/2017 1145   CREATININE 1.10 (H) 04/30/2017 0628   CREATININE 1.08 (H) 11/12/2016 1040   CALCIUM 9.8 04/22/2017 1145   GFRNONAA 64 04/22/2017 1145   GFRAA 73 04/22/2017 1145   Lab Results  Component Value Date   HGBA1C 7.0 (H) 04/22/2017   HGBA1C 7.7 (H) 03/11/2017   HGBA1C 6.2 02/13/2014   Lab Results  Component Value Date   INSULIN 19.6 04/22/2017   CBC    Component Value Date/Time   WBC 7.4 04/22/2017 1145   WBC 8.1 02/18/2017 1201    RBC 4.70 04/22/2017 1145   RBC 5.08 02/18/2017 1201   HGB 14.3 04/30/2017 0628   HGB 14.3 04/22/2017 1145   HCT 42.0 04/30/2017 0628   HCT 42.3 04/22/2017 1145   PLT 252 02/18/2017 1201   MCV 90 04/22/2017 1145   MCH 30.4 04/22/2017 1145   MCH 30.7 02/18/2017 1201   MCHC 33.8 04/22/2017 1145   MCHC 34.2 02/18/2017 1201   RDW 13.9 04/22/2017 1145   LYMPHSABS 2.5 04/22/2017 1145   MONOABS 0.6 02/28/2016 1414   EOSABS 0.1 04/22/2017 1145   BASOSABS 0.0 04/22/2017 1145   Iron/TIBC/Ferritin/ %Sat No results found for: IRON, TIBC, FERRITIN, IRONPCTSAT Lipid Panel     Component Value Date/Time   CHOL 130 04/22/2017 1145   TRIG 141 04/22/2017 1145   HDL 25 (L) 04/22/2017 1145   CHOLHDL 6 05/26/2016 1134   VLDL 26.0 05/26/2016 1134   LDLCALC 77 04/22/2017 1145   Hepatic Function Panel     Component Value Date/Time   PROT 7.5 04/22/2017 1145   ALBUMIN 4.3 04/22/2017 1145   AST 16 04/22/2017 1145   ALT 15 04/22/2017 1145   ALKPHOS 87 04/22/2017 1145   BILITOT 0.4 04/22/2017 1145   BILIDIR 0.1 02/28/2016 1414     ASSESSMENT AND PLAN: Type 2 diabetes mellitus without complication, without long-term current use of insulin ([place]) - Plan: liraglutide 18 MG/3ML SOPN, glucose blood (ONE TOUCH ULTRA TEST) test strip, Insulin Pen Needle 32G X 4 MM MISC  Other hyperlipidemia  Morbid obesity ([place])  PLAN:  Diabetes II Simara has been given extensive diabetes education by myself today including ideal fasting and post-prandial blood glucose readings, individual ideal Hgb A1c goals  and hypoglycemia prevention. We discussed the importance of good blood sugar control to decrease the likelihood of diabetic complications such as nephropathy, neuropathy, limb loss, blindness, coronary artery disease, and death. We discussed the importance of intensive lifestyle modification including  diet, exercise and weight loss as the first line treatment for diabetes. Mykelti agrees to start victoza 0.6 mg  every morning #1 pen with no refills and we will refill test strips and nano needles #100 with no refills and she will follow up at the agreed upon time.  Hyperlipidemia Lavida was informed of the American Heart Association Guidelines emphasizing intensive lifestyle modifications as the first line treatment for hyperlipidemia. We discussed many lifestyle modifications today in depth, and Sinthia will continue to work on decreasing saturated fats such as fatty red meat, butter and many fried foods. She will also increase vegetables and lean protein in her diet and continue to work on exercise and weight loss efforts. We will re-check labs in 3 months and she agrees to continue medications as prescribed and will follow up with our clinic in 2 weeks.  Obesity Konner is currently in the action stage of change. As such, her goal is to continue with weight loss efforts She has agreed to follow the Category 3 plan Ahmari has been instructed to work up to a goal of 150 minutes of combined cardio and strengthening exercise per week for weight loss and overall health benefits. We discussed the following Behavioral Modification Strategies today: no skipping meals, meal planning & cooking strategies, increasing lean protein intake and decrease eating out  Kenae has agreed to follow up with our clinic in 2 weeks. She was informed of the importance of frequent follow up visits to maximize her success with intensive lifestyle modifications for her multiple health conditions.  I, Doreene Nest, am acting as scribe for Dennard Nip, MD  I have reviewed the above documentation for accuracy and completeness, and I agree with the above. -Dennard Nip, MD  OBESITY BEHAVIORAL INTERVENTION VISIT  Today's visit was # 2 out of 23.  Starting weight: 271 lbs Starting date: 04/22/17 Today's weight : 267 lbs  Today's date: 05/06/2017 Total lbs lost to date: 4 (Patients must lose 7 lbs in the first 6 months to continue with  counseling)   ASK: We discussed the diagnosis of obesity with [name] today and Chalene agreed to give [name] permission to discuss obesity behavioral modification therapy today.  ASSESS: Itha has the diagnosis of obesity and her BMI today is 27.5 Zayne is in the action stage of change   ADVISE: Madasyn was educated on the multiple health risks of obesity as well as the benefit of weight loss to improve her health. She was advised of the need for long term treatment and the importance of lifestyle modifications.  AGREE: Multiple dietary modification options and treatment options were discussed and  Dannya agreed to follow the Category 3 plan We discussed the following Behavioral Modification Strategies today: no skipping meals, meal planning & cooking strategies, increasing lean protein intake and decrease eating out

## 2017-05-13 ENCOUNTER — Other Ambulatory Visit (INDEPENDENT_AMBULATORY_CARE_PROVIDER_SITE_OTHER): Payer: Self-pay

## 2017-05-13 ENCOUNTER — Encounter (INDEPENDENT_AMBULATORY_CARE_PROVIDER_SITE_OTHER): Payer: Self-pay

## 2017-05-13 ENCOUNTER — Encounter (INDEPENDENT_AMBULATORY_CARE_PROVIDER_SITE_OTHER): Payer: Self-pay | Admitting: Family Medicine

## 2017-05-13 DIAGNOSIS — E119 Type 2 diabetes mellitus without complications: Secondary | ICD-10-CM

## 2017-05-13 MED ORDER — LIRAGLUTIDE 18 MG/3ML ~~LOC~~ SOPN: 0.6000 mg | PEN_INJECTOR | Freq: Every morning | SUBCUTANEOUS | 0 refills | Status: DC

## 2017-05-25 ENCOUNTER — Ambulatory Visit (INDEPENDENT_AMBULATORY_CARE_PROVIDER_SITE_OTHER): Payer: Managed Care, Other (non HMO) | Admitting: Family Medicine

## 2017-05-25 VITALS — BP 134/76 | HR 79 | Temp 97.8°F | Ht 65.0 in | Wt 264.0 lb

## 2017-05-25 DIAGNOSIS — Z6841 Body Mass Index (BMI) 40.0 and over, adult: Secondary | ICD-10-CM

## 2017-05-25 DIAGNOSIS — IMO0001 Reserved for inherently not codable concepts without codable children: Secondary | ICD-10-CM

## 2017-05-25 DIAGNOSIS — K5909 Other constipation: Secondary | ICD-10-CM | POA: Diagnosis not present

## 2017-05-25 DIAGNOSIS — E669 Obesity, unspecified: Secondary | ICD-10-CM

## 2017-05-25 DIAGNOSIS — E119 Type 2 diabetes mellitus without complications: Secondary | ICD-10-CM | POA: Diagnosis not present

## 2017-05-26 ENCOUNTER — Ambulatory Visit: Payer: Managed Care, Other (non HMO) | Admitting: Family

## 2017-05-26 MED ORDER — POLYETHYLENE GLYCOL 3350 17 GM/SCOOP PO POWD: 1.0000 | Freq: Once | ORAL | 0 refills | Status: AC

## 2017-05-26 NOTE — Progress Notes (Signed)
Office: [phone]  /  Fax: [phone]   HPI:   Chief Complaint: OBESITY Lisa [name] is here to discuss her progress with her obesity treatment plan. She is on the  follow the Category 3 plan and is following her eating plan approximately 80 to 85 % of the time. She states she is exercising 0 minutes 0 times per week. Lisa [name] continues to do well with weight loss. Hunger is controlled. Her weight is 264 lb (119.7 kg) today and has had a weight loss of 3 pounds over a period of 3 weeks since her last visit. She has lost 7 lbs since starting treatment with [name].  Constipation Lisa [name] notes constipation for the last few weeks, worse since attempting weight loss. She states BM are less frequent and are not hard and painful. She denies hematochezia or melena.   Diabetes II Lisa [name] has a diagnosis of diabetes type II. Lisa [name] states fasting BGs range between 112 and 158 and denies any hypoglycemic episodes or polyphagia. She has been working on intensive lifestyle modifications including diet, exercise, and weight loss to help control her blood glucose levels.   ALLERGIES: Allergies  Allergen Reactions  . Ceftin [Cefuroxime] Other (See Comments)    Severe stomach pain - Diverticulitis flare up  . Guaifenesin & Derivatives Other (See Comments)    Restlessness, jittery  . Hctz [Hydrochlorothiazide] Other (See Comments)    Causes a headache  . Morphine And Related Other (See Comments)    Moodiness.  Causes anger.  . Naproxen Other (See Comments)    Insomnia  . Lisinopril Cough         MEDICATIONS: Current Outpatient Prescriptions on File Prior to Visit  Medication Sig Dispense Refill  . aspirin 325 MG EC tablet Take 1 tablet (325 mg total) by mouth daily. 30 tablet 10  . atorvastatin (LIPITOR) 40 MG tablet Take 1 tablet (40 mg total) by mouth daily. 90 tablet 3  . Blood Glucose Monitoring Suppl (ONE TOUCH ULTRA MINI) w/Device KIT Use meter to check blood sugars 1-4 times daily as instructed. 1  each 0  . buPROPion (WELLBUTRIN SR) 150 MG 12 hr tablet TAKE 1 TABLET (150 MG TOTAL) BY MOUTH DAILY  0  . calcium carbonate (TUMS - DOSED IN MG ELEMENTAL CALCIUM) 500 MG chewable tablet Chew 3 tablets by mouth daily as needed for indigestion or heartburn.     . carvedilol (COREG) 25 MG tablet Take 1 tablet (25 mg total) by mouth 2 (two) times daily. 180 tablet 3  . clopidogrel (PLAVIX) 75 MG tablet Take 1 tablet (75 mg total) by mouth daily. 30 tablet 11  . dicyclomine (BENTYL) 10 MG capsule TAKE 1 CAPSULE (10 MG TOTAL) BY MOUTH 3 (THREE) TIMES DAILY AS NEEDED FOR SPASMS. 90 capsule 3  . doxazosin (CARDURA) 2 MG tablet Take 2 mg by mouth 2 (two) times daily.     . fluticasone (FLONASE) 50 MCG/ACT nasal spray Place 2 sprays into both nostrils daily as needed for allergies or rhinitis.    Marland Kitchen glucose blood (ONE TOUCH ULTRA TEST) test strip Use one strip per test. Test blood sugars 1-4 times daily as instructed. 100 each 0  . ibuprofen (ADVIL,MOTRIN) 200 MG tablet Take 400-600 mg by mouth 2 (two) times daily as needed for moderate pain.     . Insulin Pen Needle 32G X 4 MM MISC 1 Package by Does not apply route every morning. 100 each 0  . Lancets Misc. (ONE TOUCH SURESOFT) MISC Use 1  lancet per test. Test blood sugars 1-4 times per day as instructed. 100 each 1  . liraglutide (VICTOZA) 18 MG/3ML SOPN Inject 0.1 mLs (0.6 mg total) into the skin every morning. (Patient taking differently: Inject 0.9 mg into the skin every morning. ) 1 pen 0  . losartan (COZAAR) 100 MG tablet Take 1 tablet (100 mg total) by mouth daily. 90 tablet 3  . metFORMIN (GLUCOPHAGE XR) 500 MG 24 hr tablet Take 1 tablet (500 mg total) by mouth daily with breakfast. (Patient not taking: Reported on 05/06/2017) 90 tablet 1  . Multiple Vitamin (MULTIVITAMIN WITH MINERALS) TABS tablet Take 1 tablet by mouth daily.    Marland Kitchen omeprazole (PRILOSEC) 40 MG capsule TAKE 1 CAPSULE (40 MG TOTAL) BY MOUTH 2 (TWO) TIMES DAILY. (Patient taking differently:  Take 40 mg by mouth daily. ) 180 capsule 1  . ONE TOUCH LANCETS MISC Use to check blood sugars twice a day 200 each 1  . spironolactone (ALDACTONE) 50 MG tablet Take 1 tablet (50 mg total) by mouth daily. 90 tablet 2   No current facility-administered medications on file prior to visit.     PAST MEDICAL HISTORY: Past Medical History:  Diagnosis Date  . Allergic rhinitis   . Asthma   . Breast nodule    right breast, to see dr Helane Rima 06-20-2012 for   . Chest pain, non-cardiac   . Chronic headaches   . Cigarette smoker   . Complication of anesthesia 05-17-2012   trouble breathing after colonscopy, needed nebulizer  . Diverticulosis   . GERD (gastroesophageal reflux disease)   . Hepatic hemangioma   . Hypertension   . IBS (irritable bowel syndrome)   . Obesity   . Pyloric erosion   . Sleep apnea    CPAP setting varies from 4-10  . Type 2 diabetes mellitus ([place]) 03/11/2017    PAST SURGICAL HISTORY: Past Surgical History:  Procedure Laterality Date  . ABDOMINAL AORTOGRAM W/LOWER EXTREMITY N/A 04/30/2017   Procedure: Abdominal Aortogram w/Lower Extremity;  Surgeon: Elam Dutch, MD;  Location: [facility];  Service: Cardiovascular;  Laterality: N/A;  . CARDIAC CATHETERIZATION  12/28/2007   EF 75%. IT REVEALS NORMAL/SUPRANORMAL LEFT VENTRICULAR SYSTOLIC FUNCTION  . CARDIOVASCULAR STRESS TEST  03/25/2007   EF 78%  . CESAREAN SECTION  2003, 2009   x 2  . CHOLECYSTECTOMY  1993  . COLONOSCOPY  2014  . ENDOMETRIAL ABLATION  09/2010   and D&C  . HERNIA REPAIR  8338   umbilical  . LAPAROSCOPY    . PERIPHERAL VASCULAR BALLOON ANGIOPLASTY Left 04/30/2017   Procedure: Peripheral Vascular Balloon Angioplasty;  Surgeon: Elam Dutch, MD;  Location: [facility];  Service: Cardiovascular;  Laterality: Left;  SFA  . TUBAL LIGATION    . UMBILICAL HERNIA REPAIR  2004  . US ECHOCARDIOGRAPHY  05/08/2008   EF 55-60%  . VENTRAL HERNIA REPAIR  06/15/2012   Procedure:  LAPAROSCOPIC VENTRAL HERNIA;  Surgeon: Edward Jolly, MD;  Location: [facility];  Service: General;  Laterality: N/A;  LAPAROSCOPIC REPAIR VENTRAL HERNIA    SOCIAL HISTORY: Social History  Substance Use Topics  . Smoking status: Current Every Day Smoker    Packs/day: 0.50    Years: 25.00    Types: Cigarettes    Last attempt to quit: 09/18/2016  . Smokeless tobacco: Never Used  . Alcohol use 0.0 oz/week     Comment: very rare    FAMILY HISTORY: Family History  Problem  Relation Age of Onset  . Hypertension Mother   . Lung cancer Mother   . Diabetes Maternal Grandmother   . Heart disease Father   . Heart attack Paternal Uncle   . Breast cancer Unknown   . Colon cancer Neg Hx     ROS: Review of Systems  Constitutional: Positive for weight loss.  Gastrointestinal: Positive for constipation. Negative for melena.  Endo/Heme/Allergies:       Negative polyphagia Negative hypoglycemia    PHYSICAL EXAM: Blood pressure 134/76, pulse 79, temperature 97.8 F (36.6 C), temperature source Oral, height '5\' 5"'$  (1.651 m), weight 264 lb (119.7 kg), SpO2 97 %. Body mass index is 43.93 kg/m. Physical Exam  RECENT LABS AND TESTS: BMET    Component Value Date/Time   NA 141 04/30/2017 0628   NA 138 04/22/2017 1145   K 3.8 04/30/2017 0628   CL 107 04/30/2017 0628   CO2 19 04/22/2017 1145   GLUCOSE 139 (H) 04/30/2017 0628   BUN 36 (H) 04/30/2017 0628   BUN 19 04/22/2017 1145   CREATININE 1.10 (H) 04/30/2017 0628   CREATININE 1.08 (H) 11/12/2016 1040   CALCIUM 9.8 04/22/2017 1145   GFRNONAA 64 04/22/2017 1145   GFRAA 73 04/22/2017 1145   Lab Results  Component Value Date   HGBA1C 7.0 (H) 04/22/2017   HGBA1C 7.7 (H) 03/11/2017   HGBA1C 6.2 02/13/2014   Lab Results  Component Value Date   INSULIN 19.6 04/22/2017   CBC    Component Value Date/Time   WBC 7.4 04/22/2017 1145   WBC 8.1 02/18/2017 1201   RBC 4.70 04/22/2017 1145   RBC 5.08 02/18/2017 1201   HGB 14.3  04/30/2017 0628   HGB 14.3 04/22/2017 1145   HCT 42.0 04/30/2017 0628   HCT 42.3 04/22/2017 1145   PLT 252 02/18/2017 1201   MCV 90 04/22/2017 1145   MCH 30.4 04/22/2017 1145   MCH 30.7 02/18/2017 1201   MCHC 33.8 04/22/2017 1145   MCHC 34.2 02/18/2017 1201   RDW 13.9 04/22/2017 1145   LYMPHSABS 2.5 04/22/2017 1145   MONOABS 0.6 02/28/2016 1414   EOSABS 0.1 04/22/2017 1145   BASOSABS 0.0 04/22/2017 1145   Iron/TIBC/Ferritin/ %Sat No results found for: IRON, TIBC, FERRITIN, IRONPCTSAT Lipid Panel     Component Value Date/Time   CHOL 130 04/22/2017 1145   TRIG 141 04/22/2017 1145   HDL 25 (L) 04/22/2017 1145   CHOLHDL 6 05/26/2016 1134   VLDL 26.0 05/26/2016 1134   LDLCALC 77 04/22/2017 1145   Hepatic Function Panel     Component Value Date/Time   PROT 7.5 04/22/2017 1145   ALBUMIN 4.3 04/22/2017 1145   AST 16 04/22/2017 1145   ALT 15 04/22/2017 1145   ALKPHOS 87 04/22/2017 1145   BILITOT 0.4 04/22/2017 1145   BILIDIR 0.1 02/28/2016 1414      ASSESSMENT AND PLAN: Type 2 diabetes mellitus without complication, without long-term current use of insulin (HCC)  Other constipation - Plan: polyethylene glycol powder (MIRALAX) powder  Class 3 obesity with serious comorbidity and body mass index (BMI) of 40.0 to 44.9 in adult, unspecified obesity type ([place])  PLAN:  Constipation Lisa [name] was informed decrease bowel movement frequency is normal while losing weight, but stools should not be hard or painful. She was advised to increase her H20 intake and work on increasing her fiber intake. High fiber foods were discussed today. Lisa [name] agrees to start Miralax 17 grams once daily and follow up at agreed upon  time.  Diabetes II Lisa [name] has been given extensive diabetes education by myself today including ideal fasting and post-prandial blood glucose readings, individual ideal HgA1c goals  and hypoglycemia prevention. We discussed the importance of good blood sugar control to decrease  the likelihood of diabetic complications such as nephropathy, neuropathy, limb loss, blindness, coronary artery disease, and death. We discussed the importance of intensive lifestyle modification including diet, exercise and weight loss as the first line treatment for diabetes. Lisa [name] agrees to increase victoza to 0.9 mg and continue metformin and will follow up at the agreed upon time.  Obesity Lisa [name] is currently in the action stage of change. As such, her goal is to continue with weight loss efforts She has agreed to follow the Category 3 plan Lisa [name] has been instructed to work up to a goal of 150 minutes of combined cardio and strengthening exercise per week for weight loss and overall health benefits. We discussed the following Behavioral Modification Strategies today: increasing lean protein intake and keeping healthy foods in the home  Lisa [name] has agreed to follow up with our clinic in 2 to 3 weeks. She was informed of the importance of frequent follow up visits to maximize her success with intensive lifestyle modifications for her multiple health conditions.  I, Lisa [name] Nest, am acting as transcriptionist for Dennard Nip, MD  I have reviewed the above documentation for accuracy and completeness, and I agree with the above. -Dennard Nip, MD  OBESITY BEHAVIORAL INTERVENTION VISIT  Today's visit was # 3 out of 22.  Starting weight: 271 lbs Starting date: 04/22/17 Today's weight : 264 lbs  Today's date: 05/25/2017 Total lbs lost to date: 7 (Patients must lose 7 lbs in the first 6 months to continue with counseling)   ASK: We discussed the diagnosis of obesity with [name] today and Tasnim agreed to give [name] permission to discuss obesity behavioral modification therapy today.  ASSESS: Adelene has the diagnosis of obesity and her BMI today is [place] is in the action stage of change   ADVISE: Calyn was educated on the multiple health risks of obesity as well as the benefit of  weight loss to improve her health. She was advised of the need for long term treatment and the importance of lifestyle modifications.  AGREE: Multiple dietary modification options and treatment options were discussed and  [place] agreed to follow the Category 3 plan We discussed the following Behavioral Modification Strategies today: increasing lean protein intake and keeping healthy foods in the home

## 2017-06-02 ENCOUNTER — Other Ambulatory Visit: Payer: Self-pay | Admitting: Family

## 2017-06-03 ENCOUNTER — Other Ambulatory Visit (INDEPENDENT_AMBULATORY_CARE_PROVIDER_SITE_OTHER): Payer: Self-pay

## 2017-06-03 DIAGNOSIS — E119 Type 2 diabetes mellitus without complications: Secondary | ICD-10-CM

## 2017-06-03 MED ORDER — GLUCOSE BLOOD VI STRP: 1.0000 | ORAL_STRIP | 0 refills | Status: DC

## 2017-06-04 ENCOUNTER — Other Ambulatory Visit: Payer: Self-pay | Admitting: Gastroenterology

## 2017-06-12 ENCOUNTER — Other Ambulatory Visit: Payer: Self-pay | Admitting: Family

## 2017-06-15 ENCOUNTER — Ambulatory Visit (INDEPENDENT_AMBULATORY_CARE_PROVIDER_SITE_OTHER): Payer: Managed Care, Other (non HMO) | Admitting: Family Medicine

## 2017-06-15 VITALS — BP 137/84 | HR 75 | Temp 98.6°F | Ht 65.0 in | Wt 262.0 lb

## 2017-06-15 DIAGNOSIS — K5909 Other constipation: Secondary | ICD-10-CM

## 2017-06-15 DIAGNOSIS — E669 Obesity, unspecified: Secondary | ICD-10-CM | POA: Diagnosis not present

## 2017-06-15 DIAGNOSIS — E119 Type 2 diabetes mellitus without complications: Secondary | ICD-10-CM

## 2017-06-15 DIAGNOSIS — Z6841 Body Mass Index (BMI) 40.0 and over, adult: Secondary | ICD-10-CM

## 2017-06-15 DIAGNOSIS — IMO0001 Reserved for inherently not codable concepts without codable children: Secondary | ICD-10-CM

## 2017-06-15 MED ORDER — LIRAGLUTIDE 18 MG/3ML ~~LOC~~ SOPN: 1.2000 mg | PEN_INJECTOR | Freq: Every morning | SUBCUTANEOUS | 0 refills | Status: DC

## 2017-06-15 MED ORDER — POLYETHYLENE GLYCOL 3350 17 GM/SCOOP PO POWD: 17.0000 g | Freq: Every day | ORAL | 0 refills | Status: AC

## 2017-06-16 ENCOUNTER — Telehealth (INDEPENDENT_AMBULATORY_CARE_PROVIDER_SITE_OTHER): Payer: Self-pay | Admitting: Family Medicine

## 2017-06-16 NOTE — Telephone Encounter (Signed)
Spoke to pharm, [facility] [address]  Confirmed pt did get #100 pen needles on 05/06/17. Pt only uses once a day.  Pharm did not know why pt is req for early refill and then stated she is not sure who was requested the refill of pen needles.  We have denied refill at this time.   Lyn Records CMA

## 2017-06-16 NOTE — Telephone Encounter (Signed)
[facility] on [facility] - Lisa [name] with pharmacy

## 2017-06-16 NOTE — Progress Notes (Signed)
Office: [phone]  /  Fax: [phone]   HPI:   Chief Complaint: OBESITY Lisa [name] is here to discuss her progress with her obesity treatment plan. She is on the  follow the Category 3 plan and is following her eating plan approximately 85 % of the time. She states she is walking at work. [name] continues to do well with weight loss on category 3 plan but has increased family sabotage and changing work hours which make planning meals difficult. Her weight is 262 lb (118.8 kg) today and has had a weight loss of 2 pounds over a period of 3 weeks since her last visit. She has lost 9 lbs since starting treatment with [name].  Diabetes II [name] has a diagnosis of diabetes type II. [name] states fasting BGs range between 98 and 130's, had one 248 reading and denies any hypoglycemic episodes. She has been working on intensive lifestyle modifications including diet, exercise, and weight loss to help control her blood glucose levels.  Constipation [name] notes constipation has not improved with increased H2O intake and fiber. She states BM are less frequent and are not hard and painful. She denies hematochezia or melena.    ALLERGIES: Allergies  Allergen Reactions  . Ceftin [Cefuroxime] Other (See Comments)    Severe stomach pain - Diverticulitis flare up  . Guaifenesin & Derivatives Other (See Comments)    Restlessness, jittery  . Hctz [Hydrochlorothiazide] Other (See Comments)    Causes a headache  . Morphine And Related Other (See Comments)    Moodiness.  Causes anger.  . Naproxen Other (See Comments)    Insomnia  . Lisinopril Cough         MEDICATIONS: Current Outpatient Prescriptions on File Prior to Visit  Medication Sig Dispense Refill  . aspirin 325 MG EC tablet Take 1 tablet (325 mg total) by mouth daily. 30 tablet 10  . atorvastatin (LIPITOR) 40 MG tablet Take 1 tablet (40 mg total) by mouth daily. 90 tablet 3  . Blood Glucose Monitoring Suppl (ONE TOUCH ULTRA MINI) w/Device KIT  Use meter to check blood sugars 1-4 times daily as instructed. 1 each 0  . buPROPion (WELLBUTRIN SR) 150 MG 12 hr tablet TAKE 1 TABLET (150 MG TOTAL) BY MOUTH DAILY  0  . calcium carbonate (TUMS - DOSED IN MG ELEMENTAL CALCIUM) 500 MG chewable tablet Chew 3 tablets by mouth daily as needed for indigestion or heartburn.     . carvedilol (COREG) 25 MG tablet Take 1 tablet (25 mg total) by mouth 2 (two) times daily. 180 tablet 3  . clopidogrel (PLAVIX) 75 MG tablet Take 1 tablet (75 mg total) by mouth daily. 30 tablet 11  . dicyclomine (BENTYL) 10 MG capsule TAKE 1 CAPSULE (10 MG TOTAL) BY MOUTH 3 (THREE) TIMES DAILY AS NEEDED FOR SPASMS. 90 capsule 3  . doxazosin (CARDURA) 2 MG tablet Take 2 mg by mouth 2 (two) times daily.     . fluticasone (FLONASE) 50 MCG/ACT nasal spray Place 2 sprays into both nostrils daily as needed for allergies or rhinitis.    Marland Kitchen glucose blood (ONE TOUCH ULTRA TEST) test strip 1 each by Other route as directed. Use one strip per test. Test blood sugars 1-4 times daily as instructed. 100 each 0  . ibuprofen (ADVIL,MOTRIN) 200 MG tablet Take 400-600 mg by mouth 2 (two) times daily as needed for moderate pain.     . Insulin Pen Needle 32G X 4 MM MISC 1 Package by Does not  apply route every morning. 100 each 0  . Lancets Misc. (ONE TOUCH SURESOFT) MISC Use 1 lancet per test. Test blood sugars 1-4 times per day as instructed. 100 each 1  . losartan (COZAAR) 100 MG tablet TAKE 1 TABLET (100 MG TOTAL) BY MOUTH DAILY. 90 tablet 1  . metFORMIN (GLUCOPHAGE-XR) 500 MG 24 hr tablet TAKE 1 TABLET (500 MG TOTAL) BY MOUTH DAILY WITH BREAKFAST. 30 tablet 0  . Multiple Vitamin (MULTIVITAMIN WITH MINERALS) TABS tablet Take 1 tablet by mouth daily.    Marland Kitchen omeprazole (PRILOSEC) 40 MG capsule TAKE 1 CAPSULE (40 MG TOTAL) BY MOUTH 2 (TWO) TIMES DAILY. 180 capsule 0  . ONE TOUCH LANCETS MISC Use to check blood sugars twice a day 200 each 1  . spironolactone (ALDACTONE) 50 MG tablet Take 1 tablet (50  mg total) by mouth daily. 90 tablet 2   No current facility-administered medications on file prior to visit.     PAST MEDICAL HISTORY: Past Medical History:  Diagnosis Date  . Allergic rhinitis   . Asthma   . Breast nodule    right breast, to see dr Helane Rima 06-20-2012 for   . Chest pain, non-cardiac   . Chronic headaches   . Cigarette smoker   . Complication of anesthesia 05-17-2012   trouble breathing after colonscopy, needed nebulizer  . Diverticulosis   . GERD (gastroesophageal reflux disease)   . Hepatic hemangioma   . Hypertension   . IBS (irritable bowel syndrome)   . Obesity   . Pyloric erosion   . Sleep apnea    CPAP setting varies from 4-10  . Type 2 diabetes mellitus ([place]) 03/11/2017    PAST SURGICAL HISTORY: Past Surgical History:  Procedure Laterality Date  . ABDOMINAL AORTOGRAM W/LOWER EXTREMITY N/A 04/30/2017   Procedure: Abdominal Aortogram w/Lower Extremity;  Surgeon: Elam Dutch, MD;  Location: [facility];  Service: Cardiovascular;  Laterality: N/A;  . CARDIAC CATHETERIZATION  12/28/2007   EF 75%. IT REVEALS NORMAL/SUPRANORMAL LEFT VENTRICULAR SYSTOLIC FUNCTION  . CARDIOVASCULAR STRESS TEST  03/25/2007   EF 78%  . CESAREAN SECTION  2003, 2009   x 2  . CHOLECYSTECTOMY  1993  . COLONOSCOPY  2014  . ENDOMETRIAL ABLATION  09/2010   and D&C  . HERNIA REPAIR  8768   umbilical  . LAPAROSCOPY    . PERIPHERAL VASCULAR BALLOON ANGIOPLASTY Left 04/30/2017   Procedure: Peripheral Vascular Balloon Angioplasty;  Surgeon: Elam Dutch, MD;  Location: [facility];  Service: Cardiovascular;  Laterality: Left;  SFA  . TUBAL LIGATION    . UMBILICAL HERNIA REPAIR  2004  . US ECHOCARDIOGRAPHY  05/08/2008   EF 55-60%  . VENTRAL HERNIA REPAIR  06/15/2012   Procedure: LAPAROSCOPIC VENTRAL HERNIA;  Surgeon: Edward Jolly, MD;  Location: [facility];  Service: General;  Laterality: N/A;  LAPAROSCOPIC REPAIR VENTRAL HERNIA    SOCIAL HISTORY: Social  History  Substance Use Topics  . Smoking status: Current Every Day Smoker    Packs/day: 0.50    Years: 25.00    Types: Cigarettes    Last attempt to quit: 09/18/2016  . Smokeless tobacco: Never Used  . Alcohol use 0.0 oz/week     Comment: very rare    FAMILY HISTORY: Family History  Problem Relation Age of Onset  . Hypertension Mother   . Lung cancer Mother   . Diabetes Maternal Grandmother   . Heart disease Father   . Heart attack Paternal Uncle   .  Breast cancer Unknown   . Colon cancer Neg Hx     ROS: Review of Systems  Constitutional: Positive for weight loss.  Gastrointestinal: Positive for constipation. Negative for melena.  Endo/Heme/Allergies:       Negative hypoglycemia    PHYSICAL EXAM: Blood pressure 137/84, pulse 75, temperature 98.6 F (37 C), temperature source Oral, height '5\' 5"'$  (1.651 m), weight 262 lb (118.8 kg), SpO2 99 %. Body mass index is 43.6 kg/m. Physical Exam  Constitutional: She is oriented to person, place, and time. She appears well-developed and well-nourished.  Cardiovascular: Normal rate.   Pulmonary/Chest: Effort normal.  Musculoskeletal: Normal range of motion.  Neurological: She is oriented to person, place, and time.  Skin: Skin is warm and dry.  Psychiatric: She has a normal mood and affect. Her behavior is normal.  Vitals reviewed.   RECENT LABS AND TESTS: BMET    Component Value Date/Time   NA 141 04/30/2017 0628   NA 138 04/22/2017 1145   K 3.8 04/30/2017 0628   CL 107 04/30/2017 0628   CO2 19 04/22/2017 1145   GLUCOSE 139 (H) 04/30/2017 0628   BUN 36 (H) 04/30/2017 0628   BUN 19 04/22/2017 1145   CREATININE 1.10 (H) 04/30/2017 0628   CREATININE 1.08 (H) 11/12/2016 1040   CALCIUM 9.8 04/22/2017 1145   GFRNONAA 64 04/22/2017 1145   GFRAA 73 04/22/2017 1145   Lab Results  Component Value Date   HGBA1C 7.0 (H) 04/22/2017   HGBA1C 7.7 (H) 03/11/2017   HGBA1C 6.2 02/13/2014   Lab Results  Component Value Date     INSULIN 19.6 04/22/2017   CBC    Component Value Date/Time   WBC 7.4 04/22/2017 1145   WBC 8.1 02/18/2017 1201   RBC 4.70 04/22/2017 1145   RBC 5.08 02/18/2017 1201   HGB 14.3 04/30/2017 0628   HGB 14.3 04/22/2017 1145   HCT 42.0 04/30/2017 0628   HCT 42.3 04/22/2017 1145   PLT 252 02/18/2017 1201   MCV 90 04/22/2017 1145   MCH 30.4 04/22/2017 1145   MCH 30.7 02/18/2017 1201   MCHC 33.8 04/22/2017 1145   MCHC 34.2 02/18/2017 1201   RDW 13.9 04/22/2017 1145   LYMPHSABS 2.5 04/22/2017 1145   MONOABS 0.6 02/28/2016 1414   EOSABS 0.1 04/22/2017 1145   BASOSABS 0.0 04/22/2017 1145   Iron/TIBC/Ferritin/ %Sat No results found for: IRON, TIBC, FERRITIN, IRONPCTSAT Lipid Panel     Component Value Date/Time   CHOL 130 04/22/2017 1145   TRIG 141 04/22/2017 1145   HDL 25 (L) 04/22/2017 1145   CHOLHDL 6 05/26/2016 1134   VLDL 26.0 05/26/2016 1134   LDLCALC 77 04/22/2017 1145   Hepatic Function Panel     Component Value Date/Time   PROT 7.5 04/22/2017 1145   ALBUMIN 4.3 04/22/2017 1145   AST 16 04/22/2017 1145   ALT 15 04/22/2017 1145   ALKPHOS 87 04/22/2017 1145   BILITOT 0.4 04/22/2017 1145   BILIDIR 0.1 02/28/2016 1414     ASSESSMENT AND PLAN: Type 2 diabetes mellitus without complication, without long-term current use of insulin (HCC) - Plan: liraglutide 18 MG/3ML SOPN  Other constipation - Plan: polyethylene glycol powder (GLYCOLAX/MIRALAX) powder  Class 3 obesity with serious comorbidity and body mass index (BMI) of 40.0 to 44.9 in adult, unspecified obesity type ([place])  PLAN:  Diabetes II Kemara has been given extensive diabetes education by myself today including ideal fasting and post-prandial blood glucose readings, individual ideal Hgb A1c goals  and hypoglycemia prevention. We discussed the importance of good blood sugar control to decrease the likelihood of diabetic complications such as nephropathy, neuropathy, limb loss, blindness, coronary artery  disease, and death. We discussed the importance of intensive lifestyle modification including diet, exercise and weight loss as the first line treatment for diabetes. [place]ola agrees to continue victoza, we will refill for 1 month and she will follow up at the agreed upon time.  Constipation Mica was informed decrease bowel movement frequency is normal while losing weight, but stools should not be hard or painful. She was advised to increase her H20 intake and work on increasing her fiber intake. High fiber foods were discussed today. Doneshia agrees to start miralax 17 grams qd #30 days with no refills and will follow up with our clinic in 2 weeks.  Obesity Jaliya is currently in the action stage of change. As such, her goal is to continue with weight loss efforts She has agreed to follow the Category 3 plan Tiawana has been instructed to work up to a goal of 150 minutes of combined cardio and strengthening exercise per week for weight loss and overall health benefits. We discussed the following Behavioral Modification Strategies today: meal planning & cooking strategies, increasing lean protein intake and decrease eating out  Nithila has agreed to follow up with our clinic in 2 weeks. She was informed of the importance of frequent follow up visits to maximize her success with intensive lifestyle modifications for her multiple health conditions.  I, Doreene Nest, am acting as transcriptionist for Dennard Nip, MD  I have reviewed the above documentation for accuracy and completeness, and I agree with the above. -Dennard Nip, MD   OBESITY BEHAVIORAL INTERVENTION VISIT  Today's visit was # 4 out of 22.  Starting weight: 271 lbs Starting date: 04/22/17 Today's weight : 262 lbs Today's date: 06/15/2017 Total lbs lost to date: 9 (Patients must lose 7 lbs in the first 6 months to continue with counseling)   ASK: We discussed the diagnosis of obesity with [name] today and Annick agreed to  give [name] permission to discuss obesity behavioral modification therapy today.  ASSESS: Dorene has the diagnosis of obesity and her BMI today is 43.7 Huma is in the action stage of change   ADVISE: Alayha was educated on the multiple health risks of obesity as well as the benefit of weight loss to improve her health. She was advised of the need for long term treatment and the importance of lifestyle modifications.  AGREE: Multiple dietary modification options and treatment options were discussed and  Paolina agreed to follow the Category 3 plan We discussed the following Behavioral Modification Strategies today: meal planning & cooking strategies, increasing lean protein intake and decrease eating out

## 2017-06-17 ENCOUNTER — Other Ambulatory Visit: Payer: Self-pay | Admitting: Family

## 2017-06-23 ENCOUNTER — Emergency Department (HOSPITAL_COMMUNITY): Payer: Managed Care, Other (non HMO)

## 2017-06-23 ENCOUNTER — Telehealth: Payer: Self-pay | Admitting: Family

## 2017-06-23 ENCOUNTER — Emergency Department (HOSPITAL_COMMUNITY)
Admission: EM | Admit: 2017-06-23 | Discharge: 2017-06-23 | Disposition: A | Discharge status: Home / Self Care | Payer: Managed Care, Other (non HMO) | Attending: Emergency Medicine | Admitting: Emergency Medicine

## 2017-06-23 ENCOUNTER — Encounter (HOSPITAL_COMMUNITY): Payer: Self-pay

## 2017-06-23 DIAGNOSIS — E119 Type 2 diabetes mellitus without complications: Secondary | ICD-10-CM | POA: Insufficient documentation

## 2017-06-23 DIAGNOSIS — R51 Headache: Secondary | ICD-10-CM | POA: Diagnosis not present

## 2017-06-23 DIAGNOSIS — Z794 Long term (current) use of insulin: Secondary | ICD-10-CM | POA: Insufficient documentation

## 2017-06-23 DIAGNOSIS — Z7984 Long term (current) use of oral hypoglycemic drugs: Secondary | ICD-10-CM | POA: Diagnosis not present

## 2017-06-23 DIAGNOSIS — Z7902 Long term (current) use of antithrombotics/antiplatelets: Secondary | ICD-10-CM | POA: Insufficient documentation

## 2017-06-23 DIAGNOSIS — Z79899 Other long term (current) drug therapy: Secondary | ICD-10-CM | POA: Diagnosis not present

## 2017-06-23 DIAGNOSIS — I10 Essential (primary) hypertension: Secondary | ICD-10-CM | POA: Insufficient documentation

## 2017-06-23 DIAGNOSIS — F17210 Nicotine dependence, cigarettes, uncomplicated: Secondary | ICD-10-CM | POA: Diagnosis not present

## 2017-06-23 DIAGNOSIS — J449 Chronic obstructive pulmonary disease, unspecified: Secondary | ICD-10-CM | POA: Diagnosis not present

## 2017-06-23 DIAGNOSIS — J45909 Unspecified asthma, uncomplicated: Secondary | ICD-10-CM | POA: Diagnosis not present

## 2017-06-23 DIAGNOSIS — R4701 Aphasia: Secondary | ICD-10-CM | POA: Diagnosis not present

## 2017-06-23 DIAGNOSIS — R519 Headache, unspecified: Secondary | ICD-10-CM

## 2017-06-23 DIAGNOSIS — Z7982 Long term (current) use of aspirin: Secondary | ICD-10-CM | POA: Diagnosis not present

## 2017-06-23 HISTORY — DX: Presence of artificial eye: Z97.0

## 2017-06-23 LAB — I-STAT CHEM 8, ED
BUN: 27 mg/dL — ABNORMAL HIGH (ref 6–20)
CHLORIDE: 106 mmol/L (ref 101–111)
Calcium, Ion: 1.22 mmol/L (ref 1.15–1.40)
Creatinine, Ser: 1.2 mg/dL — ABNORMAL HIGH (ref 0.44–1.00)
GLUCOSE: 132 mg/dL — AB (ref 65–99)
HCT: 43 % (ref 36.0–46.0)
Hemoglobin: 14.6 g/dL (ref 12.0–15.0)
POTASSIUM: 3.6 mmol/L (ref 3.5–5.1)
Sodium: 143 mmol/L (ref 135–145)
TCO2: 25 mmol/L (ref 0–100)

## 2017-06-23 LAB — COMPREHENSIVE METABOLIC PANEL
ALT: 18 U/L (ref 14–54)
AST: 17 U/L (ref 15–41)
Albumin: 3.6 g/dL (ref 3.5–5.0)
Alkaline Phosphatase: 85 U/L (ref 38–126)
Anion gap: 7 (ref 5–15)
BUN: 23 mg/dL — ABNORMAL HIGH (ref 6–20)
CO2: 24 mmol/L (ref 22–32)
CREATININE: 1.18 mg/dL — AB (ref 0.44–1.00)
Calcium: 9.5 mg/dL (ref 8.9–10.3)
Chloride: 109 mmol/L (ref 101–111)
GFR calc non Af Amer: 52 mL/min — ABNORMAL LOW (ref >60)
GFR, EST AFRICAN AMERICAN: 60 mL/min — AB (ref >60)
Glucose, Bld: 136 mg/dL — ABNORMAL HIGH (ref 65–99)
Potassium: 3.7 mmol/L (ref 3.5–5.1)
SODIUM: 140 mmol/L (ref 135–145)
Total Bilirubin: 0.6 mg/dL (ref 0.3–1.2)
Total Protein: 7.3 g/dL (ref 6.5–8.1)

## 2017-06-23 LAB — DIFFERENTIAL
BASOS ABS: 0 10*3/uL (ref 0.0–0.1)
BASOS PCT: 0 %
EOS ABS: 0.1 10*3/uL (ref 0.0–0.7)
Eosinophils Relative: 2 %
Lymphocytes Relative: 31 %
Lymphs Abs: 2.4 10*3/uL (ref 0.7–4.0)
MONOS PCT: 6 %
Monocytes Absolute: 0.5 10*3/uL (ref 0.1–1.0)
NEUTROS PCT: 61 %
Neutro Abs: 4.7 10*3/uL (ref 1.7–7.7)

## 2017-06-23 LAB — I-STAT TROPONIN, ED: Troponin i, poc: 0 ng/mL (ref 0.00–0.08)

## 2017-06-23 LAB — CBC
HCT: 42.1 % (ref 36.0–46.0)
Hemoglobin: 14.4 g/dL (ref 12.0–15.0)
MCH: 30.1 pg (ref 26.0–34.0)
MCHC: 34.2 g/dL (ref 30.0–36.0)
MCV: 88.1 fL (ref 78.0–100.0)
Platelets: 228 10*3/uL (ref 150–400)
RBC: 4.78 MIL/uL (ref 3.87–5.11)
RDW: 13.6 % (ref 11.5–15.5)
WBC: 7.7 10*3/uL (ref 4.0–10.5)

## 2017-06-23 LAB — PROTIME-INR
INR: 0.99
PROTHROMBIN TIME: 13.1 s (ref 11.4–15.2)

## 2017-06-23 LAB — APTT: APTT: 36 s (ref 24–36)

## 2017-06-23 NOTE — ED Provider Notes (Signed)
Patient accepted signout from Dr. Eulis Foster to follow up on MRI results and consult neuro hospitalist. MRI does not show acute CVA or other acute findings. Patient is alert and appropriate. She shows no signs of confusion, aphasia or neurologic dysfunction. She describes an episode whereby she felt like someone took her hand and grabbed onto her for head and face. She at the same time developed inability to speak. She reports walking at that time and feeling like everything was kind of light around her. She went to her possible was trying to talk in then ultimately was able to start getting her words out. This occurred yesterday and she was referred to the emergency department for assessment. I reviewed case with Dr. Aldean Jewett of neurology. Symptoms are very atypical for CVA\TIA. This in conjunction with MRI showing no ischemic event is reassuring. He advises patient is appropriate for follow-up with outpatient neurology.   Charlesetta Shanks, MD 06/23/17 2111

## 2017-06-23 NOTE — ED Notes (Signed)
Patient transported to MRI 

## 2017-06-23 NOTE — ED Provider Notes (Signed)
[facility] DEPT Provider Note   CSN: [id number] Arrival date & time: 06/23/17  1144     History   Chief Complaint Chief Complaint  Patient presents with  . Cerebrovascular Accident    HPI Lisa [name] is a 56 y.o. female.   Patient was at work yesterday when she suddenly developed inability to speak.  This lasted about 10 minutes and resolved spontaneously.  Since that time she has had a mild headache, and has troubles walking secondary to "dizziness" characterized as a loss of balance.  She denies palpitations, near syncope, fever, chills, shortness of breath, weakness or paresthesia.  No similar problem in the past.  There are no other known modifying factors.  HPI  Past Medical History:  Diagnosis Date  . Allergic rhinitis   . Asthma   . Breast nodule    right breast, to see dr Helane Rima 06-20-2012 for   . Chest pain, non-cardiac   . Chronic headaches   . Cigarette smoker   . Complication of anesthesia 05-17-2012   trouble breathing after colonscopy, needed nebulizer  . Diverticulosis   . GERD (gastroesophageal reflux disease)   . Hepatic hemangioma   . Hypertension   . IBS (irritable bowel syndrome)   . Obesity   . Prosthetic eye globe   . Pyloric erosion   . Sleep apnea    CPAP setting varies from 4-10  . Type 2 diabetes mellitus ([place]) 03/11/2017    Patient Active Problem List   Diagnosis Date Noted  . Type 2 diabetes mellitus ([place]) 03/11/2017  . Atypical chest pain 02/26/2017  . PAD (peripheral artery disease) ([place]) 01/19/2017  . 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 Lower abdominal pain 10/05/2016  . Head congestion 08/21/2016  . Pneumonitis 07/08/2016  . Routine general medical examination at a health care facility 05/26/2016  . Left knee pain 05/15/2016  . Flank pain 04/21/2016  . Abdominal pain 02/28/2016  . Dysuria 02/28/2016  . GERD (gastroesophageal reflux disease) 02/14/2016  . Morbid obesity  ([place]) 02/14/2016  . Tobacco use disorder 08/12/2015  . Pulmonary nodule, right 04/11/2015  . COPD (chronic obstructive pulmonary disease) ([place]) 01/18/2015  . Mediastinal lymphadenopathy 01/18/2015  . DOE (dyspnea on exertion) 03/08/2014  . Abdominal pain, left lower quadrant 08/23/2013  . Migraine without aura 07/26/2013  . Incisional hernia 02/11/2012  . Sleep apnea 02/11/2012  . Chest discomfort 10/12/2011  . HTN (hypertension) 10/12/2011    Past Surgical History:  Procedure Laterality Date  . ABDOMINAL AORTOGRAM W/LOWER EXTREMITY N/A 04/30/2017   Procedure: Abdominal Aortogram w/Lower Extremity;  Surgeon: Elam Dutch, MD;  Location: [facility];  Service: Cardiovascular;  Laterality: N/A;  . CARDIAC CATHETERIZATION  12/28/2007   EF 75%. IT REVEALS NORMAL/SUPRANORMAL LEFT VENTRICULAR SYSTOLIC FUNCTION  . CARDIOVASCULAR STRESS TEST  03/25/2007   EF 78%  . CESAREAN SECTION  2003, 2009   x 2  . CHOLECYSTECTOMY  1993  . COLONOSCOPY  2014  . ENDOMETRIAL ABLATION  09/2010   and D&C  . HERNIA REPAIR  8119   umbilical  . LAPAROSCOPY    . PERIPHERAL VASCULAR BALLOON ANGIOPLASTY Left 04/30/2017   Procedure: Peripheral Vascular Balloon Angioplasty;  Surgeon: Elam Dutch, MD;  Location: [facility];  Service: Cardiovascular;  Laterality: Left;  SFA  . TUBAL LIGATION    . UMBILICAL HERNIA REPAIR  2004  . US ECHOCARDIOGRAPHY  05/08/2008   EF 55-60%  . VENTRAL HERNIA REPAIR  06/15/2012  Procedure: LAPAROSCOPIC VENTRAL HERNIA;  Surgeon: Edward Jolly, MD;  Location: [facility];  Service: General;  Laterality: N/A;  LAPAROSCOPIC REPAIR VENTRAL HERNIA    OB History    Gravida Para Term Preterm AB Living   '2 2 2     2   '$ SAB TAB Ectopic Multiple Live Births                   Home Medications    Prior to Admission medications   Medication Sig Start Date End Date Taking? Authorizing Provider  acetaminophen (TYLENOL) 325 MG tablet Take 650 mg by mouth every 6  (six) hours as needed (for pain).   Yes [provider]  aspirin 325 MG EC tablet Take 1 tablet (325 mg total) by mouth daily. 04/30/17  Yes Fields, Jessy Oto, MD  atorvastatin (LIPITOR) 40 MG tablet Take 1 tablet (40 mg total) by mouth daily. 01/19/17  Yes Nche, Charlene Brooke, NP  buPROPion [facility] SR) 150 MG 12 hr tablet Take 150 mg by mouth in the morning 04/17/17  Yes [provider]  calcium carbonate (TUMS - DOSED IN MG ELEMENTAL CALCIUM) 500 MG chewable tablet Chew 1-3 tablets by mouth daily as needed for indigestion or heartburn.    Yes [provider]  carvedilol (COREG) 25 MG tablet TAKE 1 TABLET (25 MG TOTAL) BY MOUTH 2 (TWO) TIMES DAILY. 06/17/17  Yes Golden Circle, FNP  clopidogrel (PLAVIX) 75 MG tablet Take 1 tablet (75 mg total) by mouth daily. 04/30/17  Yes Fields, Jessy Oto, MD  dicyclomine (BENTYL) 10 MG capsule TAKE 1 CAPSULE (10 MG TOTAL) BY MOUTH 3 (THREE) TIMES DAILY AS NEEDED FOR SPASMS. 02/04/17  Yes Ladene Artist, MD  doxazosin (CARDURA) 2 MG tablet Take 2 mg by mouth 2 (two) times daily.  12/24/16  Yes [provider]  fluticasone (FLONASE) 50 MCG/ACT nasal spray Place 2 sprays into both nostrils daily as needed for allergies or rhinitis.   Yes [provider]  liraglutide 18 MG/3ML SOPN Inject 0.2 mLs (1.2 mg total) into the skin every morning. 06/15/17  Yes Beasley, Caren D, MD  losartan (COZAAR) 100 MG tablet TAKE 1 TABLET (100 MG TOTAL) BY MOUTH DAILY. 06/03/17  Yes Golden Circle, FNP  Multiple Vitamin (MULTIVITAMIN WITH MINERALS) TABS tablet Take 1 tablet by mouth daily.   Yes [provider]  omeprazole (PRILOSEC) 40 MG capsule TAKE 1 CAPSULE (40 MG TOTAL) BY MOUTH 2 (TWO) TIMES DAILY. Patient taking differently: Take 40 mg by mouth See admin instructions. ONE TO TWO TIMES A DAY 06/04/17  Yes Ladene Artist, MD  spironolactone (ALDACTONE) 50 MG tablet Take 1 tablet (50 mg total) by mouth daily. 12/08/16  Yes Nahser,  Wonda Cheng, MD  Blood Glucose Monitoring Suppl (ONE TOUCH ULTRA MINI) w/Device KIT Use meter to check blood sugars 1-4 times daily as instructed. 03/24/17   Golden Circle, FNP  glucose blood (ONE TOUCH ULTRA TEST) test strip 1 each by Other route as directed. Use one strip per test. Test blood sugars 1-4 times daily as instructed. 06/03/17   Dennard Nip D, MD  Insulin Pen Needle 32G X 4 MM MISC 1 Package by Does not apply route every morning. 05/06/17   Dennard Nip D, MD  Lancets Misc. (ONE TOUCH SURESOFT) MISC Use 1 lancet per test. Test blood sugars 1-4 times per day as instructed. 03/24/17   Golden Circle, FNP  metFORMIN (GLUCOPHAGE-XR) 500 MG 24 hr  tablet TAKE 1 TABLET (500 MG TOTAL) BY MOUTH DAILY WITH BREAKFAST. Patient not taking: Reported on 06/23/2017 06/14/17   Golden Circle, FNP  ONE TOUCH LANCETS MISC Use to check blood sugars twice a day 04/09/17   Golden Circle, FNP  polyethylene glycol powder (GLYCOLAX/MIRALAX) powder Take 17 g by mouth daily. 06/15/17 07/15/17  Starlyn Skeans, MD    Family History Family History  Problem Relation Age of Onset  . Hypertension Mother   . Lung cancer Mother   . Diabetes Maternal Grandmother   . Heart disease Father   . Heart attack Paternal Uncle   . Breast cancer Unknown   . Colon cancer Neg Hx     Social History Social History  Substance Use Topics  . Smoking status: Current Every Day Smoker    Packs/day: 0.50    Years: 25.00    Types: Cigarettes    Last attempt to quit: 09/18/2016  . Smokeless tobacco: Never Used  . Alcohol use 0.0 oz/week     Comment: very rare     Allergies   Almond oil; Apple; Ceftin [cefuroxime]; Cherry extract; Other; Guaifenesin & derivatives; Hctz [hydrochlorothiazide]; Morphine and related; Naproxen; and Lisinopril   Review of Systems Review of Systems  All other systems reviewed and are negative.    Physical Exam Updated Vital Signs BP (!) 110/95   Pulse 73   Temp 98 F (36.7 C)    Resp 18   SpO2 100%   Physical Exam  Constitutional: She is oriented to person, place, and time. She appears well-developed and well-nourished. No distress.  HENT:  Head: Normocephalic and atraumatic.  Eyes: Conjunctivae are normal.  Prosthetic left eye.  Mild left leg lag, which patient states is chronic.  Reactive right pupil and intact right external ocular muscles.  Neck: Normal range of motion and phonation normal. Neck supple.  Cardiovascular: Normal rate and regular rhythm.   Pulmonary/Chest: Effort normal and breath sounds normal. She exhibits no tenderness.  Abdominal: Soft. She exhibits no distension. There is no tenderness. There is no guarding.  Musculoskeletal: Normal range of motion.  Neurological: She is alert and oriented to person, place, and time. She exhibits normal muscle tone.  No dysarthria aphasia or nystagmus.  No dysmetria.  Slight gait instability with ambulation.  Skin: Skin is warm and dry.  Psychiatric: She has a normal mood and affect. Her behavior is normal. Judgment and thought content normal.  Nursing note and vitals reviewed.    ED Treatments / Results  Labs (all labs ordered are listed, but only abnormal results are displayed) Labs Reviewed  COMPREHENSIVE METABOLIC PANEL - Abnormal; Notable for the following:       Result Value   Glucose, Bld 136 (*)    BUN 23 (*)    Creatinine, Ser 1.18 (*)    GFR calc non Af Amer 52 (*)    GFR calc Af Amer 60 (*)    All other components within normal limits  I-STAT CHEM 8, ED - Abnormal; Notable for the following:    BUN 27 (*)    Creatinine, Ser 1.20 (*)    Glucose, Bld 132 (*)    All other components within normal limits  PROTIME-INR  APTT  CBC  DIFFERENTIAL  I-STAT TROPONIN, ED  CBG MONITORING, ED    EKG  EKG Interpretation None       Radiology Ct Head Wo Contrast  Result Date: 06/23/2017 CLINICAL DATA:  Aphasia EXAM: CT HEAD WITHOUT  CONTRAST TECHNIQUE: Contiguous axial images were  obtained from the base of the skull through the vertex without intravenous contrast. COMPARISON:  04/03/2008 FINDINGS: Brain: Stable lateral ventricular enlargement is noted. No findings to suggest acute hemorrhage, acute infarction or space-occupying mass lesion are noted. Vascular: No hyperdense vessel or unexpected calcification. Skull: Normal. Negative for fracture or focal lesion. Sinuses/Orbits: Sinuses are well aerated bilaterally. Normal-appearing right orbit is seen. Calcific changes in the left globe are noted with stable decrease in size of left globe. A prosthesis is noted anteriorly also stable from the prior exam. Other: None. IMPRESSION: Chronic changes without acute abnormality. Electronically Signed   By: Inez Catalina M.D.   On: 06/23/2017 13:31    Procedures Procedures (including critical care time)  Medications Ordered in ED Medications - No data to display   Initial Impression / Assessment and Plan / ED Course  I have reviewed the triage vital signs and the nursing notes.  Pertinent labs & imaging results that were available during my care of the patient were reviewed by me and considered in my medical decision making (see chart for details).  Clinical Course as of Jun 23 1701  Wed Jun 23, 2017  1659 MRI ordered to evaluate for acute CVA, head CT negative.  [EW]    Clinical Course User Index [EW] Daleen Bo, MD    Patient Vitals for the past 24 hrs:  BP Temp Temp src Pulse Resp SpO2  06/23/17 1615 (!) 110/95 - - 73 - 100 %  06/23/17 1545 130/67 - - 71 - 100 %  06/23/17 1512 - 98 F (36.7 C) - - - -  06/23/17 1333 123/75 98 F (36.7 C) Oral 71 18 99 %  06/23/17 1213 125/67 - - - - -  06/23/17 1207 - 98.4 F (36.9 C) Oral 78 17 95 %    4:59 PM Reevaluation with update and discussion. After initial assessment and treatment, an updated evaluation reveals no change in clinical status.  MRI pending.  Patient updated.Daleen Bo L    Final Clinical  Impressions(s) / ED Diagnoses   Final diagnoses:  Aphasia  Nonintractable headache, unspecified chronicity pattern, unspecified headache type    Transient inability to speak, consistent with a aphasia, spontaneously resolved.  Mild additional symptom of left facial numbness, and headache.  Advanced imaging ordered to evaluate for occult stroke.  Nursing Notes Reviewed/ Care Coordinated Applicable Imaging Reviewed Interpretation of Laboratory Data incorporated into ED treatment  Plan-MRI imaging, and discuss results with neuro hospitalist, prior to disposition.  New Prescriptions New Prescriptions   No medications on file     Daleen Bo, MD 06/23/17 [phone]

## 2017-06-23 NOTE — Telephone Encounter (Signed)
Pt stated that yesterday while she was at work she was in the middle of talking to a customer and she had this weird swimmy feeling in her head, all the sudden could not speak, feeling cold, shaky, dry mouth. Checked her sugar around 3pm and it was 127. States that she started feeling better after that episode but still not feeling normal. Today has sxs of swimmy headedness states she has had a tingling sensation between jaw and ear every now and then not constant and has had annoying headaches that have been going on for a week that she thinks could be stress related. Checked her BP at 6:30 this morning and it was 138/81 and her sugar at 7:00 it was 134. Please advise on what pt should do.

## 2017-06-23 NOTE — ED Notes (Signed)
Called MRI, pt next on list and informed of delay

## 2017-06-23 NOTE — ED Triage Notes (Signed)
Pt presents to the ed with complaints of yesterday having dizziness, headache and tingling in the left side of her face with an episode of trouble speaking. Pt is now speaking with out difficulty remaining symptoms are still present mildly. Denies any trauma to her head and does take blood thinners. Pt is alert and oriented ambulatory with a steady gait

## 2017-06-23 NOTE — Telephone Encounter (Signed)
Called pt to let her know response. Pt agreed and stated she will go to ED.

## 2017-06-23 NOTE — Telephone Encounter (Signed)
Pt would like a call back to explain her symptoms, states she had a diabetic episode yesterday and she would not tell me anymore just stated she wanted a nurse to call her

## 2017-06-23 NOTE — Telephone Encounter (Signed)
Based on her symptoms I would be concern for the possibility of a TIA or mini-stroke and needs to be evaluated for further work up at the ED.

## 2017-06-23 NOTE — ED Notes (Signed)
Per dr pt allowed to eat and drink, given crackers, [place] sandwich and water

## 2017-06-28 ENCOUNTER — Ambulatory Visit (INDEPENDENT_AMBULATORY_CARE_PROVIDER_SITE_OTHER): Payer: Managed Care, Other (non HMO) | Admitting: Physician Assistant

## 2017-06-28 VITALS — BP 127/84 | HR 74 | Temp 98.8°F | Ht 65.0 in | Wt 260.0 lb

## 2017-06-28 DIAGNOSIS — Z6841 Body Mass Index (BMI) 40.0 and over, adult: Secondary | ICD-10-CM | POA: Diagnosis not present

## 2017-06-28 DIAGNOSIS — E119 Type 2 diabetes mellitus without complications: Secondary | ICD-10-CM

## 2017-06-28 DIAGNOSIS — E669 Obesity, unspecified: Secondary | ICD-10-CM

## 2017-06-28 DIAGNOSIS — I10 Essential (primary) hypertension: Secondary | ICD-10-CM

## 2017-06-28 DIAGNOSIS — IMO0001 Reserved for inherently not codable concepts without codable children: Secondary | ICD-10-CM

## 2017-06-28 MED ORDER — LIRAGLUTIDE 18 MG/3ML ~~LOC~~ SOPN: 1.2000 mg | PEN_INJECTOR | Freq: Every morning | SUBCUTANEOUS | 0 refills | Status: DC

## 2017-06-28 MED ORDER — GLUCOSE BLOOD VI STRP: 1.0000 | ORAL_STRIP | 0 refills | Status: DC

## 2017-06-29 NOTE — Progress Notes (Signed)
Office: [phone]  /  Fax: [phone]   HPI:   Chief Complaint: OBESITY Lisa [name] is here to discuss her progress with her obesity treatment plan. She is on the  follow the Category 3 plan and is following her eating plan approximately 85 % of the time. She states she is walking 5 to 7 times per week. Lisa [name] continues to do well with weight loss. She is planning ahead well and is controlling her portions and making smarter choices. Her weight is 260 lb (117.9 kg) today and has had a weight loss of 2 pounds over a period of 2 weeks since her last visit. She has lost 11 lbs since starting treatment with Lisa [name].  Diabetes II Lisa [name] has a diagnosis of diabetes type II. Lisa [name] states BGs range between 110 and 160's and denies any hypoglycemic episodes. She has been working on intensive lifestyle modifications including diet, exercise, and weight loss to help control her blood glucose levels.  Hypertension Lisa [name] is a 56 y.o. female with hypertension.  Lisa [name] denies chest pain, headache or shortness of breath on exertion. She is working weight loss to help control her blood pressure with the goal of decreasing her risk of heart attack and stroke. Lisa [name] blood pressure is not currently controlled.   ALLERGIES: Allergies  Allergen Reactions  . Almond Oil Anaphylaxis and Nausea And Vomiting  . Apple Anaphylaxis  . Ceftin [Cefuroxime] Other (See Comments)    Severe stomach pain - Diverticulitis flares up  . Cherry Extract Anaphylaxis  . Other Anaphylaxis and Itching    Most fruits- Are NOT tolerated (PLEASE ASK PATIENT BEFORE GIVING, AS CERTAIN FRUITS ARE TOLERATED IN LIMITED QUANTITIES!!)  . Guaifenesin & Derivatives Other (See Comments)    Restlessness, jittery  . Hctz [Hydrochlorothiazide] Other (See Comments)    Causes a headache  . Morphine And Related Other (See Comments)    Moodiness.  Causes anger.  . Naproxen Other (See Comments)    Insomnia  . Lisinopril Cough           MEDICATIONS: Current Outpatient Prescriptions on File Prior to Visit  Medication Sig Dispense Refill  . acetaminophen (TYLENOL) 325 MG tablet Take 650 mg by mouth every 6 (six) hours as needed (for pain).    Marland Kitchen aspirin 325 MG EC tablet Take 1 tablet (325 mg total) by mouth daily. 30 tablet 10  . atorvastatin (LIPITOR) 40 MG tablet Take 1 tablet (40 mg total) by mouth daily. 90 tablet 3  . Blood Glucose Monitoring Suppl (ONE TOUCH ULTRA MINI) w/Device KIT Use meter to check blood sugars 1-4 times daily as instructed. 1 each 0  . buPROPion (WELLBUTRIN SR) 150 MG 12 hr tablet Take 150 mg by mouth in the morning  0  . calcium carbonate (TUMS - DOSED IN MG ELEMENTAL CALCIUM) 500 MG chewable tablet Chew 1-3 tablets by mouth daily as needed for indigestion or heartburn.     . carvedilol (COREG) 25 MG tablet TAKE 1 TABLET (25 MG TOTAL) BY MOUTH 2 (TWO) TIMES DAILY. 180 tablet 3  . clopidogrel (PLAVIX) 75 MG tablet Take 1 tablet (75 mg total) by mouth daily. 30 tablet 11  . dicyclomine (BENTYL) 10 MG capsule TAKE 1 CAPSULE (10 MG TOTAL) BY MOUTH 3 (THREE) TIMES DAILY AS NEEDED FOR SPASMS. 90 capsule 3  . doxazosin (CARDURA) 2 MG tablet Take 2 mg by mouth 2 (two) times daily.     . fluticasone (FLONASE) 50 MCG/ACT nasal spray Place  2 sprays into both nostrils daily as needed for allergies or rhinitis.    . Insulin Pen Needle 32G X 4 MM MISC 1 Package by Does not apply route every morning. 100 each 0  . Lancets Misc. (ONE TOUCH SURESOFT) MISC Use 1 lancet per test. Test blood sugars 1-4 times per day as instructed. 100 each 1  . losartan (COZAAR) 100 MG tablet TAKE 1 TABLET (100 MG TOTAL) BY MOUTH DAILY. 90 tablet 1  . metFORMIN (GLUCOPHAGE-XR) 500 MG 24 hr tablet TAKE 1 TABLET (500 MG TOTAL) BY MOUTH DAILY WITH BREAKFAST. 30 tablet 0  . Multiple Vitamin (MULTIVITAMIN WITH MINERALS) TABS tablet Take 1 tablet by mouth daily.    Marland Kitchen omeprazole (PRILOSEC) 40 MG capsule TAKE 1 CAPSULE (40 MG TOTAL) BY  MOUTH 2 (TWO) TIMES DAILY. (Patient taking differently: Take 40 mg by mouth See admin instructions. ONE TO TWO TIMES A DAY) 180 capsule 0  . ONE TOUCH LANCETS MISC Use to check blood sugars twice a day 200 each 1  . polyethylene glycol powder (GLYCOLAX/MIRALAX) powder Take 17 g by mouth daily. 3350 g 0  . spironolactone (ALDACTONE) 50 MG tablet Take 1 tablet (50 mg total) by mouth daily. 90 tablet 2   No current facility-administered medications on file prior to visit.     PAST MEDICAL HISTORY: Past Medical History:  Diagnosis Date  . Allergic rhinitis   . Asthma   . Breast nodule    right breast, to see dr Helane Rima 06-20-2012 for   . Chest pain, non-cardiac   . Chronic headaches   . Cigarette smoker   . Complication of anesthesia 05-17-2012   trouble breathing after colonscopy, needed nebulizer  . Diverticulosis   . GERD (gastroesophageal reflux disease)   . Hepatic hemangioma   . Hypertension   . IBS (irritable bowel syndrome)   . Obesity   . Prosthetic eye globe   . Pyloric erosion   . Sleep apnea    CPAP setting varies from 4-10  . Type 2 diabetes mellitus (Lisa [name]) 03/11/2017    PAST SURGICAL HISTORY: Past Surgical History:  Procedure Laterality Date  . ABDOMINAL AORTOGRAM W/LOWER EXTREMITY N/A 04/30/2017   Procedure: Abdominal Aortogram w/Lower Extremity;  Surgeon: Elam Dutch, MD;  Location: [facility];  Service: Cardiovascular;  Laterality: N/A;  . CARDIAC CATHETERIZATION  12/28/2007   EF 75%. IT REVEALS NORMAL/SUPRANORMAL LEFT VENTRICULAR SYSTOLIC FUNCTION  . CARDIOVASCULAR STRESS TEST  03/25/2007   EF 78%  . CESAREAN SECTION  2003, 2009   x 2  . CHOLECYSTECTOMY  1993  . COLONOSCOPY  2014  . ENDOMETRIAL ABLATION  09/2010   and D&C  . HERNIA REPAIR  1624   umbilical  . LAPAROSCOPY    . PERIPHERAL VASCULAR BALLOON ANGIOPLASTY Left 04/30/2017   Procedure: Peripheral Vascular Balloon Angioplasty;  Surgeon: Elam Dutch, MD;  Location: [facility];   Service: Cardiovascular;  Laterality: Left;  SFA  . TUBAL LIGATION    . UMBILICAL HERNIA REPAIR  2004  . Lisa [name] ECHOCARDIOGRAPHY  05/08/2008   EF 55-60%  . VENTRAL HERNIA REPAIR  06/15/2012   Procedure: LAPAROSCOPIC VENTRAL HERNIA;  Surgeon: Edward Jolly, MD;  Location: [facility];  Service: General;  Laterality: N/A;  LAPAROSCOPIC REPAIR VENTRAL HERNIA    SOCIAL HISTORY: Social History  Substance Use Topics  . Smoking status: Current Every Day Smoker    Packs/day: 0.50    Years: 25.00    Types: Cigarettes  Last attempt to quit: 09/18/2016  . Smokeless tobacco: Never Used  . Alcohol use 0.0 oz/week     Comment: very rare    FAMILY HISTORY: Family History  Problem Relation Age of Onset  . Hypertension Mother   . Lung cancer Mother   . Diabetes Maternal Grandmother   . Heart disease Father   . Heart attack Paternal Uncle   . Breast cancer Unknown   . Colon cancer Neg Hx     ROS: Review of Systems  Constitutional: Positive for weight loss.  Respiratory: Negative for shortness of breath (on exertion).   Cardiovascular: Negative for chest pain.  Neurological: Negative for headaches.  Endo/Heme/Allergies:       Negative hypoglycemia    PHYSICAL EXAM: Blood pressure 127/84, pulse 74, temperature 98.8 F (37.1 C), temperature source Oral, height _0  (1.651 m), weight 260 lb (117.9 kg), SpO2 99 %. Body mass index is 43.27 kg/m. Physical Exam  Constitutional: She is oriented to person, place, and time. She appears well-developed and well-nourished.  Cardiovascular: Normal rate.   Pulmonary/Chest: Effort normal.  Musculoskeletal: Normal range of motion.  Neurological: She is oriented to person, place, and time.  Skin: Skin is warm and dry.  Psychiatric: She has a normal mood and affect. Her behavior is normal.  Vitals reviewed.   RECENT LABS AND TESTS: BMET    Component Value Date/Time   NA 143 06/23/2017 1247   NA 138 04/22/2017 1145   K 3.6 06/23/2017 1247    CL 106 06/23/2017 1247   CO2 24 06/23/2017 1221   GLUCOSE 132 (H) 06/23/2017 1247   BUN 27 (H) 06/23/2017 1247   BUN 19 04/22/2017 1145   CREATININE 1.20 (H) 06/23/2017 1247   CREATININE 1.08 (H) 11/12/2016 1040   CALCIUM 9.5 06/23/2017 1221   GFRNONAA 52 (L) 06/23/2017 1221   GFRAA 60 (L) 06/23/2017 1221   Lab Results  Component Value Date   HGBA1C 7.0 (H) 04/22/2017   HGBA1C 7.7 (H) 03/11/2017   HGBA1C 6.2 02/13/2014   Lab Results  Component Value Date   INSULIN 19.6 04/22/2017   CBC    Component Value Date/Time   WBC 7.7 06/23/2017 1221   RBC 4.78 06/23/2017 1221   HGB 14.6 06/23/2017 1247   HGB 14.3 04/22/2017 1145   HCT 43.0 06/23/2017 1247   HCT 42.3 04/22/2017 1145   PLT 228 06/23/2017 1221   MCV 88.1 06/23/2017 1221   MCV 90 04/22/2017 1145   MCH 30.1 06/23/2017 1221   MCHC 34.2 06/23/2017 1221   RDW 13.6 06/23/2017 1221   RDW 13.9 04/22/2017 1145   LYMPHSABS 2.4 06/23/2017 1221   LYMPHSABS 2.5 04/22/2017 1145   MONOABS 0.5 06/23/2017 1221   EOSABS 0.1 06/23/2017 1221   EOSABS 0.1 04/22/2017 1145   BASOSABS 0.0 06/23/2017 1221   BASOSABS 0.0 04/22/2017 1145   Iron/TIBC/Ferritin/ %Sat No results found for: IRON, TIBC, FERRITIN, IRONPCTSAT Lipid Panel     Component Value Date/Time   CHOL 130 04/22/2017 1145   TRIG 141 04/22/2017 1145   HDL 25 (L) 04/22/2017 1145   CHOLHDL 6 05/26/2016 1134   VLDL 26.0 05/26/2016 1134   LDLCALC 77 04/22/2017 1145   Hepatic Function Panel     Component Value Date/Time   PROT 7.3 06/23/2017 1221   PROT 7.5 04/22/2017 1145   ALBUMIN 3.6 06/23/2017 1221   ALBUMIN 4.3 04/22/2017 1145   AST 17 06/23/2017 1221   ALT 18 06/23/2017 1221   ALKPHOS  85 06/23/2017 1221   BILITOT 0.6 06/23/2017 1221   BILITOT 0.4 04/22/2017 1145   BILIDIR 0.1 02/28/2016 1414     ASSESSMENT AND PLAN: Type 2 diabetes mellitus without complication, without long-term current use of insulin (HCC) - Plan: liraglutide 18 MG/3ML SOPN, glucose  blood (ONE TOUCH ULTRA TEST) test strip  Essential hypertension  Class 3 obesity with serious comorbidity and body mass index (BMI) of 40.0 to 44.9 in adult, unspecified obesity type (HCC)  PLAN:  Diabetes II Infant has been given extensive diabetes education by myself today including ideal fasting and post-prandial blood glucose readings, individual ideal Hgb A1c goals  and hypoglycemia prevention. We discussed the importance of good blood sugar control to decrease the likelihood of diabetic complications such as nephropathy, neuropathy, limb loss, blindness, coronary artery disease, and death. We discussed the importance of intensive lifestyle modification including diet, exercise and weight loss as the first line treatment for diabetes. Lametria agrees to continue Victoza 1.2 mg daily we will refill #2 pen and test strips and she will follow up at the agreed upon time.  Hypertension We discussed sodium restriction, working on healthy weight loss, and a regular exercise program as the means to achieve improved blood pressure control. Alejandra agreed with this plan and agreed to follow up as directed. We will continue to monitor her blood pressure as well as her progress with the above lifestyle modifications. She will continue her medications as prescribed and will watch for signs of hypotension as she continues her lifestyle modifications.  Obesity Earnestene is currently in the action stage of change. As such, her goal is to continue with weight loss efforts She has agreed to follow the Category 3 plan Kessler has been instructed to work up to a goal of 150 minutes of combined cardio and strengthening exercise per week for weight loss and overall health benefits. We discussed the following Behavioral Modification Strategies today: increasing lean protein intake and better snacking choices  Becci has agreed to follow up with our clinic in 2 weeks. She was informed of the importance of frequent follow up  visits to maximize her success with intensive lifestyle modifications for her multiple health conditions.  I, Doreene Nest, am acting as transcriptionist for Lacy Duverney, PA-C  I have reviewed the above documentation for accuracy and completeness, and I agree with the above. -Lacy Duverney, PA-C  I have reviewed the above note and agree with the plan. -Dennard Nip, MD   OBESITY BEHAVIORAL INTERVENTION VISIT  Today's visit was # 5 out of 22.  Starting weight: 271 lbs Starting date: 04/22/17 Today's weight : 260 lbs Today's date: 06/28/2017 Total lbs lost to date: 11 (Patients must lose 7 lbs in the first 6 months to continue with counseling)   ASK: We discussed the diagnosis of obesity with Lisa [name] today and Saje agreed to give Lisa [name] permission to discuss obesity behavioral modification therapy today.  ASSESS: Jonnette has the diagnosis of obesity and her BMI today is 24.4 Bellarose is in the action stage of change   ADVISE: Hoa was educated on the multiple health risks of obesity as well as the benefit of weight loss to improve her health. She was advised of the need for long term treatment and the importance of lifestyle modifications.  AGREE: Multiple dietary modification options and treatment options were discussed and  Rendi agreed to follow the Category 3 plan We discussed the following Behavioral Modification Strategies today: increasing lean protein intake and better  snacking choices

## 2017-07-08 ENCOUNTER — Encounter (INDEPENDENT_AMBULATORY_CARE_PROVIDER_SITE_OTHER): Payer: Self-pay | Admitting: Family Medicine

## 2017-07-12 ENCOUNTER — Encounter (INDEPENDENT_AMBULATORY_CARE_PROVIDER_SITE_OTHER): Payer: Self-pay

## 2017-07-12 ENCOUNTER — Ambulatory Visit (INDEPENDENT_AMBULATORY_CARE_PROVIDER_SITE_OTHER): Payer: Managed Care, Other (non HMO) | Admitting: Physician Assistant

## 2017-07-12 NOTE — Telephone Encounter (Signed)
Lisa [name] needs to r/s her appt.  Thanks Ninoshka Wainwright

## 2017-07-15 ENCOUNTER — Ambulatory Visit (INDEPENDENT_AMBULATORY_CARE_PROVIDER_SITE_OTHER)
Admission: RE | Admit: 2017-07-15 | Discharge: 2017-07-15 | Disposition: A | Discharge status: Home / Self Care | Payer: Managed Care, Other (non HMO) | Source: Ambulatory Visit | Attending: Emergency Medicine | Admitting: Emergency Medicine

## 2017-07-15 DIAGNOSIS — R911 Solitary pulmonary nodule: Secondary | ICD-10-CM

## 2017-07-29 ENCOUNTER — Other Ambulatory Visit: Payer: Self-pay

## 2017-07-29 ENCOUNTER — Ambulatory Visit (INDEPENDENT_AMBULATORY_CARE_PROVIDER_SITE_OTHER): Payer: Managed Care, Other (non HMO) | Admitting: Family

## 2017-07-29 ENCOUNTER — Encounter: Payer: Self-pay | Admitting: Family

## 2017-07-29 VITALS — BP 128/80 | HR 80 | Temp 98.5°F | Resp 16 | Ht 65.0 in | Wt 263.0 lb

## 2017-07-29 DIAGNOSIS — E1165 Type 2 diabetes mellitus with hyperglycemia: Secondary | ICD-10-CM

## 2017-07-29 DIAGNOSIS — M25559 Pain in unspecified hip: Secondary | ICD-10-CM | POA: Diagnosis not present

## 2017-07-29 DIAGNOSIS — G8929 Other chronic pain: Secondary | ICD-10-CM | POA: Diagnosis not present

## 2017-07-29 NOTE — Progress Notes (Signed)
Subjective:    Patient ID: Lisa [name], female    DOB: [birth year], 56 y.o.   MRN: [id number]  Chief Complaint  Patient presents with  . Follow-up    diabetes, bilateral hip pain     HPI:  Lisa [name] is a 56 y.o. female who  has a past medical history of Allergic rhinitis; Asthma; Breast nodule; Chest pain, non-cardiac; Chronic headaches; Cigarette smoker; Complication of anesthesia (05-17-2012); Diverticulosis; GERD (gastroesophageal reflux disease); Hepatic hemangioma; Hypertension; IBS (irritable bowel syndrome); Obesity; Prosthetic eye globe; Pyloric erosion; Sleep apnea; and Type 2 diabetes mellitus ([place]) (03/11/2017). and presents today for an office visit.  1.) Diabetes - Currently prescribed Victoza and not currently taking the medication secondary to cost. Blood sugars have been fairly well controlled. Control is improved when she does take the medication. No excessive hunger, thirst and urination. Denies changes to vision. Working on nutrition with the health/wellness. She is working on improving physical activity.   2.) Hip pain - Associated symptom of pain located in her bilateral hips has been going on for several years waxing and waning. Over the past several weeks the pain has worsened and is effecting her ability to walk at times. No trauma or injuries. Modifying factors include Motrin which helped in the past.  Allergies  Allergen Reactions  . Almond Oil Anaphylaxis and Nausea And Vomiting  . Apple Anaphylaxis  . Ceftin [Cefuroxime] Other (See Comments)    Severe stomach pain - Diverticulitis flares up  . Cherry Extract Anaphylaxis  . Other Anaphylaxis and Itching    Most fruits- Are NOT tolerated (PLEASE ASK PATIENT BEFORE GIVING, AS CERTAIN FRUITS ARE TOLERATED IN LIMITED QUANTITIES!!)  . Guaifenesin & Derivatives Other (See Comments)    Restlessness, jittery  . Hctz [Hydrochlorothiazide] Other (See Comments)    Causes a headache  . Morphine And Related  Other (See Comments)    Moodiness.  Causes anger.  . Naproxen Other (See Comments)    Insomnia  . Lisinopril Cough           Outpatient Medications Prior to Visit  Medication Sig Dispense Refill  . acetaminophen (TYLENOL) 325 MG tablet Take 650 mg by mouth every 6 (six) hours as needed (for pain).    Marland Kitchen aspirin 325 MG EC tablet Take 1 tablet (325 mg total) by mouth daily. 30 tablet 10  . atorvastatin (LIPITOR) 40 MG tablet Take 1 tablet (40 mg total) by mouth daily. 90 tablet 3  . Blood Glucose Monitoring Suppl (ONE TOUCH ULTRA MINI) w/Device KIT Use meter to check blood sugars 1-4 times daily as instructed. 1 each 0  . buPROPion (WELLBUTRIN SR) 150 MG 12 hr tablet Take 150 mg by mouth in the morning  0  . calcium carbonate (TUMS - DOSED IN MG ELEMENTAL CALCIUM) 500 MG chewable tablet Chew 1-3 tablets by mouth daily as needed for indigestion or heartburn.     . carvedilol (COREG) 25 MG tablet TAKE 1 TABLET (25 MG TOTAL) BY MOUTH 2 (TWO) TIMES DAILY. 180 tablet 3  . clopidogrel (PLAVIX) 75 MG tablet Take 1 tablet (75 mg total) by mouth daily. 30 tablet 11  . dicyclomine (BENTYL) 10 MG capsule TAKE 1 CAPSULE (10 MG TOTAL) BY MOUTH 3 (THREE) TIMES DAILY AS NEEDED FOR SPASMS. 90 capsule 3  . doxazosin (CARDURA) 2 MG tablet Take 2 mg by mouth 2 (two) times daily.     . fluticasone (FLONASE) 50 MCG/ACT nasal spray Place 2 sprays into both  nostrils daily as needed for allergies or rhinitis.    Marland Kitchen glucose blood (ONE TOUCH ULTRA TEST) test strip 1 each by Other route as directed. Use one strip per test. Test blood sugars 1-4 times daily as instructed. 100 each 0  . Insulin Pen Needle 32G X 4 MM MISC 1 Package by Does not apply route every morning. 100 each 0  . Lancets Misc. (ONE TOUCH SURESOFT) MISC Use 1 lancet per test. Test blood sugars 1-4 times per day as instructed. 100 each 1  . liraglutide 18 MG/3ML SOPN Inject 0.2 mLs (1.2 mg total) into the skin every morning. 2 pen 0  . losartan (COZAAR)  100 MG tablet TAKE 1 TABLET (100 MG TOTAL) BY MOUTH DAILY. 90 tablet 1  . Multiple Vitamin (MULTIVITAMIN WITH MINERALS) TABS tablet Take 1 tablet by mouth daily.    Marland Kitchen omeprazole (PRILOSEC) 40 MG capsule TAKE 1 CAPSULE (40 MG TOTAL) BY MOUTH 2 (TWO) TIMES DAILY. (Patient taking differently: Take 40 mg by mouth See admin instructions. ONE TO TWO TIMES A DAY) 180 capsule 0  . ONE TOUCH LANCETS MISC Use to check blood sugars twice a day 200 each 1  . spironolactone (ALDACTONE) 50 MG tablet Take 1 tablet (50 mg total) by mouth daily. 90 tablet 2  . metFORMIN (GLUCOPHAGE-XR) 500 MG 24 hr tablet TAKE 1 TABLET (500 MG TOTAL) BY MOUTH DAILY WITH BREAKFAST. 30 tablet 0   No facility-administered medications prior to visit.       Past Surgical History:  Procedure Laterality Date  . ABDOMINAL AORTOGRAM W/LOWER EXTREMITY N/A 04/30/2017   Procedure: Abdominal Aortogram w/Lower Extremity;  Surgeon: Sherren Kerns, MD;  Location: [facility];  Service: Cardiovascular;  Laterality: N/A;  . CARDIAC CATHETERIZATION  12/28/2007   EF 75%. IT REVEALS NORMAL/SUPRANORMAL LEFT VENTRICULAR SYSTOLIC FUNCTION  . CARDIOVASCULAR STRESS TEST  03/25/2007   EF 78%  . CESAREAN SECTION  2003, 2009   x 2  . CHOLECYSTECTOMY  1993  . COLONOSCOPY  2014  . ENDOMETRIAL ABLATION  09/2010   and D&C  . HERNIA REPAIR  2004   umbilical  . LAPAROSCOPY    . PERIPHERAL VASCULAR BALLOON ANGIOPLASTY Left 04/30/2017   Procedure: Peripheral Vascular Balloon Angioplasty;  Surgeon: Sherren Kerns, MD;  Location: [facility];  Service: Cardiovascular;  Laterality: Left;  SFA  . TUBAL LIGATION    . UMBILICAL HERNIA REPAIR  2004  . US ECHOCARDIOGRAPHY  05/08/2008   EF 55-60%  . VENTRAL HERNIA REPAIR  06/15/2012   Procedure: LAPAROSCOPIC VENTRAL HERNIA;  Surgeon: Mariella Saa, MD;  Location: [facility];  Service: General;  Laterality: N/A;  LAPAROSCOPIC REPAIR VENTRAL HERNIA      Past Medical History:  Diagnosis Date    . Allergic rhinitis   . Asthma   . Breast nodule    right breast, to see dr Vincente Poli 06-20-2012 for   . Chest pain, non-cardiac   . Chronic headaches   . Cigarette smoker   . Complication of anesthesia 05-17-2012   trouble breathing after colonscopy, needed nebulizer  . Diverticulosis   . GERD (gastroesophageal reflux disease)   . Hepatic hemangioma   . Hypertension   . IBS (irritable bowel syndrome)   . Obesity   . Prosthetic eye globe   . Pyloric erosion   . Sleep apnea    CPAP setting varies from 4-10  . Type 2 diabetes mellitus (HCC) 03/11/2017      Review  of Systems  Constitutional: Negative for chills, fatigue and fever.  Eyes:       Denies changes in vision  Respiratory: Negative for chest tightness, shortness of breath and wheezing.   Cardiovascular: Negative for chest pain, palpitations and leg swelling.  Endocrine: Negative for polydipsia, polyphagia and polyuria.  Musculoskeletal:       Positive for bilateral hip pain.   Neurological: Negative for weakness and numbness.      Objective:    BP 128/80 (BP Location: Left Arm, Patient Position: Sitting, Cuff Size: Large)   Pulse 80   Temp 98.5 F (36.9 C) (Oral)   Resp 16   Ht '5\' 5"'$  (1.651 m)   Wt 263 lb (119.3 kg)   SpO2 98%   BMI 43.77 kg/m  Nursing note and vital signs reviewed.  Physical Exam  Constitutional: She is oriented to person, place, and time. She appears well-developed and well-nourished. No distress.  Cardiovascular: Normal rate, regular rhythm, normal heart sounds and intact distal pulses.  Exam reveals no gallop and no friction rub.   No murmur heard. Pulmonary/Chest: Effort normal and breath sounds normal. No respiratory distress. She has no wheezes. She has no rales. She exhibits no tenderness.  Musculoskeletal:  Bilateral hips with no obvious deformity, discoloration, or edema. There is mild limited range of motion in internal Rotation. Discomfort noted when standing from a seated  position. Strength is normal. Negative Faber's. Distal pulses and sensation are intact and appropriate.  Neurological: She is alert and oriented to person, place, and time.  Skin: Skin is warm and dry.  Psychiatric: She has a normal mood and affect. Her behavior is normal. Judgment and thought content normal.       Assessment & Plan:   Problem List Items Addressed This Visit      Endocrine   Type 2 diabetes mellitus ([place]) - Primary    Continues to lose weight with blood sugars being in control when taking medication. Unable to afford medication at times. Obtain hemoglobin A1c. Encouraged to complete diabetic eye exam independently. Continue to monitor blood sugars at home. Sample of Victoza provided. Follow-up pending A1c results. Continue to work with      Relevant Orders   Hemoglobin A1c     Other   Chronic hip pain    Chronic bilateral hip pain likely related to osteoarthritis and previous weight with no significant findings on exam today. Cannot rule out bursitis. Obtain x-rays. Treat conservatively with ice/moist heat and over-the-counter medications as needed for symptom relief and supportive care. Start home exercise therapy with information provided and after visit summary. If symptoms worsen or do not improve consider referral to physical therapy and or orthopedics for further treatment.      Relevant Orders   DG HIP UNILAT WITH PELVIS 2-3 VIEWS RIGHT   DG HIP UNILAT WITH PELVIS 2-3 VIEWS LEFT       I have discontinued Ms. Vanderheyden's metFORMIN. I am also having her maintain her spironolactone, atorvastatin, dicyclomine, doxazosin, calcium carbonate, ONE TOUCH SURESOFT, ONE TOUCH ULTRA MINI, ONE TOUCH LANCETS, buPROPion, multivitamin with minerals, fluticasone, aspirin, clopidogrel, Insulin Pen Needle, losartan, omeprazole, carvedilol, acetaminophen, liraglutide, and glucose blood.   Follow-up: Return in about 3 months (around 10/29/2017), or if symptoms worsen or fail to  improve.  Mauricio Po, FNP

## 2017-07-29 NOTE — Assessment & Plan Note (Signed)
Continues to lose weight with blood sugars being in control when taking medication. Unable to afford medication at times. Obtain hemoglobin A1c. Encouraged to complete diabetic eye exam independently. Continue to monitor blood sugars at home. Sample of Victoza provided. Follow-up pending A1c results. Continue to work with

## 2017-07-29 NOTE — Assessment & Plan Note (Signed)
Chronic bilateral hip pain likely related to osteoarthritis and previous weight with no significant findings on exam today. Cannot rule out bursitis. Obtain x-rays. Treat conservatively with ice/moist heat and over-the-counter medications as needed for symptom relief and supportive care. Start home exercise therapy with information provided and after visit summary. If symptoms worsen or do not improve consider referral to physical therapy and or orthopedics for further treatment.

## 2017-07-29 NOTE — Patient Instructions (Addendum)
Thank you for choosing [facility].  SUMMARY AND INSTRUCTIONS:  You are doing great with weight loss!  Ice your hips x 20 minutes every other hour.   Tylenol at 500-650 mg 3x daily as needed.   Stretches and exercises daily.   Labs:  Please stop by the lab on the lower level of the building for your blood work. Your results will be released to [place] (or called to you) after review, usually within 72 hours after test completion. If any changes need to be made, you will be notified at that same time.  1.) The lab is open from 7:30am to 5:30 pm Monday-Friday 2.) No appointment is necessary 3.) Fasting (if needed) is 6-8 hours after food and drink; black coffee and water are okay    Follow up:  If your symptoms worsen or fail to improve, please contact our office for further instruction, or in case of emergency go directly to the emergency room at the closest medical facility.    Hip Exercises Ask your health care provider which exercises are safe for you. Do exercises exactly as told by your health care provider and adjust them as directed. It is normal to feel mild stretching, pulling, tightness, or discomfort as you do these exercises, but you should stop right away if you feel sudden pain or your pain gets worse.Do not begin these exercises until told by your health care provider. STRETCHING AND RANGE OF MOTION EXERCISES These exercises warm up your muscles and joints and improve the movement and flexibility of your hip. These exercises also help to relieve pain, numbness, and tingling. Exercise A: Hamstrings, Supine  1. Lie on your back. 2. Loop a belt or towel over the ball of your left / rightfoot. The ball of your foot is on the walking surface, right under your toes. 3. Straighten your left / rightknee and slowly pull on the belt to raise your leg. ? Do not let your left / right knee bend while you do this. ? Keep your other leg flat on the floor. ? Raise the  left / right leg until you feel a gentle stretch behind your left / right knee or thigh. 4. Hold this position for __________ seconds. 5. Slowly return your leg to the starting position. Repeat __________ times. Complete this stretch __________ times a day. Exercise B: Hip Rotators  1. Lie on your back on a firm surface. 2. Hold your left / right knee with your left / right hand. Hold your ankle with your other hand. 3. Gently pull your left / right knee and rotate your lower leg toward your other shoulder. ? Pull until you feel a stretch in your buttocks. ? Keep your hips and shoulders firmly planted while you do this stretch. 4. Hold this position for __________ seconds. Repeat __________ times. Complete this stretch __________ times a day. Exercise C: V-Sit (Hamstrings and Adductors)  1. Sit on the floor with your legs extended in a large "V" shape. Keep your knees straight during this exercise. 2. Start with your head and chest upright, then bend at your waist to reach for your left foot (position A). You should feel a stretch in your right inner thigh. 3. Hold this position for __________ seconds. Then slowly return to the upright position. 4. Bend at your waist to reach forward (position B). You should feel a stretch behind both of your thighs and knees. 5. Hold this position for __________ seconds. Then slowly return to the  upright position. 6. Bend at your waist to reach for your right foot (position C). You should feel a stretch in your left inner thigh. 7. Hold this position for __________ seconds. Then slowly return to the upright position. Repeat __________ times. Complete this stretch __________ times a day. Exercise D: Lunge (Hip Flexors)  1. Place your left / right knee on the floor and bend your other knee so that is directly over your ankle. You should be half-kneeling. 2. Keep good posture with your head over your shoulders. 3. Tighten your buttocks to point your tailbone  downward. This helps your back to keep from arching too much. 4. You should feel a gentle stretch in the front of your left / right thigh and hip. If you do not feel any resistance, slightly slide your other foot forward and then slowly lunge forward so your knee once again lines up over your ankle. 5. Make sure your tailbone continues to point downward. 6. Hold this position for __________ seconds. Repeat __________ times. Complete this stretch __________ times a day. STRENGTHENING EXERCISES These exercises build strength and endurance in your hip. Endurance is the ability to use your muscles for a long time, even after they get tired. Exercise E: Bridge (Hip Extensors)  1. Lie on your back on a firm surface with your knees bent and your feet flat on the floor. 2. Tighten your buttocks muscles and lift your bottom off the floor until the trunk of your body is level with your thighs. ? Do not arch your back. ? You should feel the muscles working in your buttocks and the back of your thighs. If you do not feel these muscles, slide your feet 1-2 inches (2.5-5 cm) farther away from your buttocks. 3. Hold this position for __________ seconds. 4. Slowly lower your hips to the starting position. 5. Let your muscles relax completely between repetitions. 6. If this exercise is too easy, try doing it with your arms crossed over your chest. Repeat __________ times. Complete this exercise __________ times a day. Exercise F: Straight Leg Raises - Hip Abductors  1. Lie on your side with your left / right leg in the top position. Lie so your head, shoulder, knee, and hip line up with each other. You may bend your bottom knee to help you balance. 2. Roll your hips slightly forward, so your hips are stacked directly over each other and your left / right knee is facing forward. 3. Leading with your heel, lift your top leg 4-6 inches (10-15 cm). You should feel the muscles in your outer hip lifting. ? Do not  let your foot drift forward. ? Do not let your knee roll toward the ceiling. 4. Hold this position for __________ seconds. 5. Slowly return to the starting position. 6. Let your muscles relax completely between repetitions. Repeat __________ times. Complete this exercise __________ times a day. Exercise G: Straight Leg Raises - Hip Adductors  1. Lie on your side with your left / right leg in the bottom position. Lie so your head, shoulder, knee, and hip line up. You may place your upper foot in front to help you balance. 2. Roll your hips slightly forward, so your hips are stacked directly over each other and your left / right knee is facing forward. 3. Tense the muscles in your inner thigh and lift your bottom leg 4-6 inches (10-15 cm). 4. Hold this position for __________ seconds. 5. Slowly return to the starting position. 6. Let  your muscles relax completely between repetitions. Repeat __________ times. Complete this exercise __________ times a day. Exercise H: Straight Leg Raises - Quadriceps  1. Lie on your back with your left / right leg extended and your other knee bent. 2. Tense the muscles in the front of your left / right thigh. When you do this, you should see your kneecap slide up or see increased dimpling just above your knee. 3. Tighten these muscles even more and raise your leg 4-6 inches (10-15 cm) off the floor. 4. Hold this position for __________ seconds. 5. Keep these muscles tense as you lower your leg. 6. Relax the muscles slowly and completely between repetitions. Repeat __________ times. Complete this exercise __________ times a day. Exercise I: Hip Abductors, Standing 1. Tie one end of a rubber exercise band or tubing to a secure surface, such as a table or pole. 2. Loop the other end of the band or tubing around your left / right ankle. 3. Keeping your ankle with the band or tubing directly opposite of the secured end, step away until there is tension in the  tubing or band. Hold onto a chair as needed for balance. 4. Lift your left / right leg out to your side. While you do this: ? Keep your back upright. ? Keep your shoulders over your hips. ? Keep your toes pointing forward. ? Make sure to use your hip muscles to lift your leg. Do not "throw" your leg or tip your body to lift your leg. 5. Hold this position for __________ seconds. 6. Slowly return to the starting position. Repeat __________ times. Complete this exercise __________ times a day. Exercise J: Squats (Quadriceps) 1. Stand in a door frame so your feet and knees are in line with the frame. You may place your hands on the frame for balance. 2. Slowly bend your knees and lower your hips like you are going to sit in a chair. ? Keep your lower legs in a straight-up-and-down position. ? Do not let your hips go lower than your knees. ? Do not bend your knees lower than told by your health care provider. ? If your hip pain increases, do not bend as low. 3. Hold this position for ___________ seconds. 4. Slowly push with your legs to return to standing. Do not use your hands to pull yourself to standing. Repeat __________ times. Complete this exercise __________ times a day. This information is not intended to replace advice given to you by your health care provider. Make sure you discuss any questions you have with your health care provider. Document Released: 12/04/2005 Document Revised: 08/10/2016 Document Reviewed: 11/11/2015 Elsevier Interactive Patient Education  [employer].

## 2017-07-29 NOTE — Addendum Note (Signed)
Addended by: Lianne Cure A on: 07/29/2017 03:39 PM   Modules accepted: Orders

## 2017-07-30 ENCOUNTER — Other Ambulatory Visit: Payer: Self-pay | Admitting: Family

## 2017-07-30 ENCOUNTER — Ambulatory Visit (INDEPENDENT_AMBULATORY_CARE_PROVIDER_SITE_OTHER): Payer: Managed Care, Other (non HMO) | Admitting: Emergency Medicine

## 2017-07-30 ENCOUNTER — Other Ambulatory Visit (INDEPENDENT_AMBULATORY_CARE_PROVIDER_SITE_OTHER): Payer: Managed Care, Other (non HMO)

## 2017-07-30 ENCOUNTER — Encounter: Payer: Self-pay | Admitting: Emergency Medicine

## 2017-07-30 ENCOUNTER — Ambulatory Visit (INDEPENDENT_AMBULATORY_CARE_PROVIDER_SITE_OTHER)
Admission: RE | Admit: 2017-07-30 | Discharge: 2017-07-30 | Disposition: A | Discharge status: Home / Self Care | Payer: Managed Care, Other (non HMO) | Source: Ambulatory Visit | Attending: Family | Admitting: Family

## 2017-07-30 DIAGNOSIS — M25559 Pain in unspecified hip: Principal | ICD-10-CM

## 2017-07-30 DIAGNOSIS — Z48812 Encounter for surgical aftercare following surgery on the circulatory system: Secondary | ICD-10-CM

## 2017-07-30 DIAGNOSIS — M25552 Pain in left hip: Secondary | ICD-10-CM

## 2017-07-30 DIAGNOSIS — G8929 Other chronic pain: Secondary | ICD-10-CM

## 2017-07-30 DIAGNOSIS — J449 Chronic obstructive pulmonary disease, unspecified: Secondary | ICD-10-CM

## 2017-07-30 DIAGNOSIS — E1165 Type 2 diabetes mellitus with hyperglycemia: Secondary | ICD-10-CM

## 2017-07-30 DIAGNOSIS — M25551 Pain in right hip: Secondary | ICD-10-CM | POA: Diagnosis not present

## 2017-07-30 LAB — HEMOGLOBIN A1C: HEMOGLOBIN A1C: 6.3 % (ref 4.6–6.5)

## 2017-07-30 MED ORDER — ALBUTEROL SULFATE HFA 108 (90 BASE) MCG/ACT IN AERS: 2.0000 | INHALATION_SPRAY | Freq: Four times a day (QID) | RESPIRATORY_TRACT | 3 refills | Status: DC | PRN

## 2017-07-30 NOTE — Progress Notes (Signed)
Subjective:    Patient ID: Lisa [name], female    DOB: October 28, 1961, 56 y.o.   MRN: [id number]  Shortness of Breath  Pertinent negatives include no ear pain, fever, headaches, leg swelling, rash, rhinorrhea, sore throat, vomiting or wheezing.   ROV 12/17/15 -- hx of multifactorial dyspnea, tobacco abuse, obstructive sleep apnea on CPAP. 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  She tells me that she has increased her smoking to 2 pks a day. She doesn't have a primary MD, does follow with Dr Acie Fredrickson. She describes sx of depression. She had trouble when she took Zoloft, more depression. She tells me that her breathing has been stable. She denies any increase in wheeze or mucous. Not currently taking BD's.   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 This showed that her nodule is unchanged and based on usual criteria is benign. She had some improvement in her inflammatory changes although she does still have some infiltrate consistent with RB-ILD. She was able to decrease her cigarettes significantly for a period of time since our last visit. Currently smoking 15 cigarettes a day. She stopped wellbutrin about 3 weeks ago. She is having continued cough. She has some atypical chest discomfort - mid to left chest, bothers her after eating, sometimes w exertion. Sharp pain, has sometimes gone to her L arm. She is followed by Dr Acie Fredrickson and has had a reassuring eval thus far. She indicates that she is benefiting from CPAP.   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 She has a 5.69mm RML nodule, most recent CT chest 06/2016. She had stopped smoking in 1-/17 to 12/17, currently using about 1/2 pack a day. She  is on wellbutrin. She is very reliable with her CPAP, benefits from it. Feels that her energy is better when she uses it. She is having ABI tomorrow for LE pain. She uses albuterol rarely.   ROV 07/30/17 -- follow-up visit for patient with a history of COPD and interstitial lung disease, mixed disease by spirometry. Her imaging has been most consistent with RB-ILD. Has been quite difficult for her to stop smoking. She still smokes approximately . A repeat CT scan of her chest was done on 07/15/17 that I have personally reviewed. This shows diffuse mosaic attenuation throughout both lungs consistent with a bronchiolitis. She has a stable 5 mm noncalcified right middle lobe nodule.  Her breathing is doing fairly well. She has a dry cough. She was on flonase, now off of it. Uses omeprazole qd    Review of Systems  Constitutional: Negative for fever and unexpected weight change.  HENT: Negative for congestion, dental problem, ear pain, nosebleeds, postnasal drip, rhinorrhea, sinus pressure, sneezing, sore throat and trouble swallowing.   Eyes: Negative for redness and itching.  Respiratory: Positive for cough. Negative for chest tightness, shortness of breath and wheezing.   Cardiovascular: Negative for palpitations and leg swelling.  Gastrointestinal: Negative for nausea and vomiting.  Genitourinary: Negative for dysuria.  Musculoskeletal: Negative for joint swelling.  Skin: Negative for rash.  Neurological: Negative for headaches.  Hematological: Does not bruise/bleed easily.  Psychiatric/Behavioral: Negative for dysphoric mood. The patient  is not nervous/anxious.         Objective:   Physical Exam Vitals:   07/30/17 1414  BP: 124/78  Pulse: 78  SpO2: 100%  Weight: 262 lb 6.4 oz (119 kg)  Height: 5\' 5"  (1.651 m)   Gen: Pleasant, obese, in no distress,  normal affect  ENT: No lesions,  mouth clear, L ptosis  Neck: No JVD, no TMG, no carotid bruits  Lungs: No use of accessory  muscles, clear without rales or rhonchi  Cardiovascular: RRR, heart sounds normal, no murmur or gallops, trace peripheral edema  Musculoskeletal: No deformities, no cyanosis or clubbing  Neuro: alert, non focal  Skin: Warm, no lesions or rashes  07/06/16 --  COMPARISON:  07/26/2015, 01/24/2015, and 09/25/2008  FINDINGS: Cardiovascular: Stable mild cardiomegaly. Aortic atherosclerosis noted.  Mediastinum/Nodes: No masses or pathologically enlarged lymph nodes identified on this un-enhanced exam. Sub-cm mediastinal lymph nodes in the right paratracheal region are stable.  Lungs/Pleura: Diffuse mosaic attenuation again seen throughout both lungs, without significant change. No evidence of pulmonary consolidation or pleural effusion.  Previously seen small ground-glass opacity in the peripheral left upper lobe has resolved. 5 mm right middle lobe pulmonary nodule on image 89/3 remains stable.  Upper Abdomen: No acute findings.  Musculoskeletal: No chest wall mass or suspicious bone lesions identified.  IMPRESSION: Stable 5 mm right middle lobe pulmonary nodule, consistent with benign etiology. No further imaging followup required. This follows the consensus statement: Guidelines for Management of Small Pulmonary Nodules Detected on CT Images: From the Fleischner Society 2017; published online before print (10.1148/[id number]).  Interval resolution of small left upper lobe ground-glass opacity.  Stable mosaic attenuation throughout both lungs, with differential diagnosis including small airways disease, respiratory bronchiolitis, and hypersensitivity pneumonitis.     Assessment & Plan:  COPD (chronic obstructive pulmonary disease) ([place]) Not currently requiring bronchodilators. I do believe she needs to keep a short-acting bronchodilator available. We will teach her how to do albuterol, fill this for her today.  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 Discussed smoking cessation with her in detail today. She will continue to work on this. I'll follow-up with her in 6 months. We will plan the timing of her repeat CT scan of the chest at our next visit.  Please continue to work on stopping smoking Please keep albuterol available to use 2 puffs if needed for shortness of breath. .  Get the flu shot this Fall Follow with Dr Lamonte Sakai in 6 months or sooner if you have any problems  GERD (gastroesophageal reflux disease) With associated cough. Continue omeprazole  Baltazar Apo, MD, PhD 07/30/2017, 2:39 PM [facility] [phone] or if no answer [phone]

## 2017-07-30 NOTE — Assessment & Plan Note (Signed)
Subtle evidence for bronchiolitis on her CT scan, unchanged. 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 Discussed smoking cessation with her in detail today. She will continue to work on this. I'll follow-up with her in 6 months. We will plan the timing of her repeat CT scan of the chest at our next visit.  Please continue to work on stopping smoking Please keep albuterol available to use 2 puffs if needed for shortness of breath. .  Get the flu shot this Fall Follow with Dr Lamonte Sakai in 6 months or sooner if you have any problems

## 2017-07-30 NOTE — Assessment & Plan Note (Signed)
With associated cough. Continue omeprazole

## 2017-07-30 NOTE — Patient Instructions (Addendum)
Please continue to work on stopping smoking Please keep albuterol available to use 2 puffs if needed for shortness of breath.  Continue omeprazole daily.  Get the flu shot this Fall Follow with Dr Lamonte Sakai in 6 months or sooner if you have any problems

## 2017-07-30 NOTE — Assessment & Plan Note (Signed)
Not currently requiring bronchodilators. I do believe she needs to keep a short-acting bronchodilator available. We will teach her how to do albuterol, fill this for her today.

## 2017-08-02 ENCOUNTER — Encounter: Payer: Self-pay | Admitting: Family

## 2017-08-03 ENCOUNTER — Ambulatory Visit (INDEPENDENT_AMBULATORY_CARE_PROVIDER_SITE_OTHER): Payer: Managed Care, Other (non HMO) | Admitting: Family

## 2017-08-03 ENCOUNTER — Encounter: Payer: Self-pay | Admitting: Family

## 2017-08-03 DIAGNOSIS — N63 Unspecified lump in unspecified breast: Secondary | ICD-10-CM | POA: Insufficient documentation

## 2017-08-03 MED ORDER — ACETAMINOPHEN-CODEINE #3 300-30 MG PO TABS: 1.0000 | ORAL_TABLET | ORAL | 0 refills | Status: DC | PRN

## 2017-08-03 MED ORDER — DOXYCYCLINE HYCLATE 100 MG PO TABS: 100.0000 mg | ORAL_TABLET | Freq: Two times a day (BID) | ORAL | 0 refills | Status: DC

## 2017-08-03 NOTE — Patient Instructions (Addendum)
Thank you for choosing [facility].  SUMMARY AND INSTRUCTIONS:  Please start doxycycline.   A referral to the breast center has been placed for further evaluation.  Tylenol #3 as needed for pain.  Medication:  Your prescription(s) have been submitted to your pharmacy or been printed and provided for you. Please take as directed and contact our office if you believe you are having problem(s) with the medication(s) or have any questions.  Referrals:  Referrals have been made during this visit. You should expect to hear back from our schedulers in about 7-10 days in regards to establishing an appointment with the specialists we discussed.   Follow up:  If your symptoms worsen or fail to improve, please contact our office for further instruction, or in case of emergency go directly to the emergency room at the closest medical facility.    Breast Tenderness Breast tenderness is a common problem for women of all ages. Breast tenderness may cause mild discomfort to severe pain. The pain usually comes and goes in association with your menstrual cycle, but it can be constant. Breast tenderness has many possible causes, including hormone changes and some medicines. Your health care provider may order tests, such as a mammogram or an ultrasound, to check for any unusual findings. Having breast tenderness usually does not mean that you have breast cancer. Follow these instructions at home: Sometimes, reassurance that you do not have breast cancer is all that is needed. In general, follow these home care instructions: Managing pain and discomfort  If directed, apply ice to the area: ? Put ice in a plastic bag. ? Place a towel between your skin and the bag. ? Leave the ice on for 20 minutes, 2-3 times a day.  Make sure you are wearing a supportive bra, especially during exercise. You may also want to wear a supportive bra while sleeping if your breasts are very tender. Medicines  Take  over-the-counter and prescription medicines only as told by your health care provider. If the cause of your pain is infection, you may be prescribed an antibiotic medicine.  If you were prescribed an antibiotic, take it as told by your health care provider. Do not stop taking the antibiotic even if you start to feel better. General instructions  Your health care provider may recommend that you reduce the amount of fat in your diet. You can do this by: ? Limiting fried foods. ? Cooking foods using methods, such as baking, boiling, grilling, and broiling.  Decrease the amount of caffeine in your diet. You can do this by drinking more water and choosing caffeine-free options.  Keep a log of the days and times when your breasts are most tender.  Ask your health care provider how to do breast exams at home. This will help you notice if you have an unusual growth or lump. Contact a health care provider if:  Any part of your breast is hard, red, and hot to the touch. This may be a sign of infection.  You are not breastfeeding and you have fluid, especially blood or pus, coming out of your nipples.  You have a fever.  You have a new or painful lump in your breast that remains after your menstrual period ends.  Your pain does not improve or it gets worse.  Your pain is interfering with your daily activities. This information is not intended to replace advice given to you by your health care provider. Make sure you discuss any questions you have  with your health care provider. Document Released: 10/29/2008 Document Revised: 08/14/2016 Document Reviewed: 08/14/2016 Elsevier Interactive Patient Education  [employer].

## 2017-08-03 NOTE — Assessment & Plan Note (Signed)
New onset breast mass located at the 2-3 o'clock position with concern for cyst versus mass. Obtain diagnostic mammogram and ultrasound. Start doxycycline to cover for mastitis or other related infection. Follow up and additional treatment pending imaging results.

## 2017-08-03 NOTE — Progress Notes (Signed)
Subjective:    Patient ID: Lisa [name], female    DOB: 1961/02/10, 56 y.o.   MRN: [id number]  Chief Complaint  Patient presents with  . Breast Mass    noticed last night that she has a knot on her left breast, very tender and swollen around her nipple    HPI:  Lisa [name] is a 56 y.o. female who  has a past medical history of Allergic rhinitis; Asthma; Breast nodule; Chest pain, non-cardiac; Chronic headaches; Cigarette smoker; Complication of anesthesia (05-17-2012); Diverticulosis; GERD (gastroesophageal reflux disease); Hepatic hemangioma; Hypertension; IBS (irritable bowel syndrome); Obesity; Prosthetic eye globe; Pyloric erosion; Sleep apnea; and Type 2 diabetes mellitus ([place]) (03/11/2017). and presents today for an acute office visit.   This is a new problem. Associated symptom of a know located on her right breast described and tender and swollen was first noted last night. Lesion is located to the right of her nipple at the 2-3 o'clock position. No nipple discharge or changes in skin texture. No fevers. No history of breast cancer in her family. Course of the symptoms has stayed about the same. Severity of the pain ranges. No modifying factors or attempted treatments.   Allergies  Allergen Reactions  . Almond Oil Anaphylaxis and Nausea And Vomiting  . Apple Anaphylaxis  . Ceftin [Cefuroxime] Other (See Comments)    Severe stomach pain - Diverticulitis flares up  . Cherry Extract Anaphylaxis  . Other Anaphylaxis and Itching    Most fruits- Are NOT tolerated (PLEASE ASK PATIENT BEFORE GIVING, AS CERTAIN FRUITS ARE TOLERATED IN LIMITED QUANTITIES!!)  . Guaifenesin & Derivatives Other (See Comments)    Restlessness, jittery  . Hctz [Hydrochlorothiazide] Other (See Comments)    Causes a headache  . Morphine And Related Other (See Comments)    Moodiness.  Causes anger.  . Naproxen Other (See Comments)    Insomnia  . Lisinopril Cough           Outpatient Medications  Prior to Visit  Medication Sig Dispense Refill  . acetaminophen (TYLENOL) 325 MG tablet Take 650 mg by mouth every 6 (six) hours as needed (for pain).    Marland Kitchen albuterol (PROVENTIL HFA;VENTOLIN HFA) 108 (90 Base) MCG/ACT inhaler Inhale 2 puffs into the lungs every 6 (six) hours as needed for wheezing or shortness of breath. 1 Inhaler 3  . aspirin 325 MG EC tablet Take 1 tablet (325 mg total) by mouth daily. 30 tablet 10  . atorvastatin (LIPITOR) 40 MG tablet Take 1 tablet (40 mg total) by mouth daily. 90 tablet 3  . Blood Glucose Monitoring Suppl (ONE TOUCH ULTRA MINI) w/Device KIT Use meter to check blood sugars 1-4 times daily as instructed. 1 each 0  . buPROPion (WELLBUTRIN SR) 150 MG 12 hr tablet Take 150 mg by mouth in the morning  0  . calcium carbonate (TUMS - DOSED IN MG ELEMENTAL CALCIUM) 500 MG chewable tablet Chew 1-3 tablets by mouth daily as needed for indigestion or heartburn.     . carvedilol (COREG) 25 MG tablet TAKE 1 TABLET (25 MG TOTAL) BY MOUTH 2 (TWO) TIMES DAILY. 180 tablet 3  . clopidogrel (PLAVIX) 75 MG tablet Take 1 tablet (75 mg total) by mouth daily. 30 tablet 11  . dicyclomine (BENTYL) 10 MG capsule TAKE 1 CAPSULE (10 MG TOTAL) BY MOUTH 3 (THREE) TIMES DAILY AS NEEDED FOR SPASMS. 90 capsule 3  . doxazosin (CARDURA) 2 MG tablet Take 2 mg by mouth 2 (two) times  daily.     . fluticasone (FLONASE) 50 MCG/ACT nasal spray Place 2 sprays into both nostrils daily as needed for allergies or rhinitis.    Marland Kitchen glucose blood (ONE TOUCH ULTRA TEST) test strip 1 each by Other route as directed. Use one strip per test. Test blood sugars 1-4 times daily as instructed. 100 each 0  . Insulin Pen Needle 32G X 4 MM MISC 1 Package by Does not apply route every morning. 100 each 0  . Lancets Misc. (ONE TOUCH SURESOFT) MISC Use 1 lancet per test. Test blood sugars 1-4 times per day as instructed. 100 each 1  . liraglutide 18 MG/3ML SOPN Inject 0.2 mLs (1.2 mg total) into the skin every morning. 2 pen  0  . losartan (COZAAR) 100 MG tablet TAKE 1 TABLET (100 MG TOTAL) BY MOUTH DAILY. 90 tablet 1  . Multiple Vitamin (MULTIVITAMIN WITH MINERALS) TABS tablet Take 1 tablet by mouth daily.    Marland Kitchen omeprazole (PRILOSEC) 40 MG capsule TAKE 1 CAPSULE (40 MG TOTAL) BY MOUTH 2 (TWO) TIMES DAILY. (Patient taking differently: Take 40 mg by mouth See admin instructions. ONE TO TWO TIMES A DAY) 180 capsule 0  . ONE TOUCH LANCETS MISC Use to check blood sugars twice a day 200 each 1  . spironolactone (ALDACTONE) 50 MG tablet Take 1 tablet (50 mg total) by mouth daily. 90 tablet 2   No facility-administered medications prior to visit.       Past Surgical History:  Procedure Laterality Date  . ABDOMINAL AORTOGRAM W/LOWER EXTREMITY N/A 04/30/2017   Procedure: Abdominal Aortogram w/Lower Extremity;  Surgeon: Elam Dutch, MD;  Location: [facility];  Service: Cardiovascular;  Laterality: N/A;  . CARDIAC CATHETERIZATION  12/28/2007   EF 75%. IT REVEALS NORMAL/SUPRANORMAL LEFT VENTRICULAR SYSTOLIC FUNCTION  . CARDIOVASCULAR STRESS TEST  03/25/2007   EF 78%  . CESAREAN SECTION  2003, 2009   x 2  . CHOLECYSTECTOMY  1993  . COLONOSCOPY  2014  . ENDOMETRIAL ABLATION  09/2010   and D&C  . HERNIA REPAIR  6644   umbilical  . LAPAROSCOPY    . PERIPHERAL VASCULAR BALLOON ANGIOPLASTY Left 04/30/2017   Procedure: Peripheral Vascular Balloon Angioplasty;  Surgeon: Elam Dutch, MD;  Location: [facility];  Service: Cardiovascular;  Laterality: Left;  SFA  . TUBAL LIGATION    . UMBILICAL HERNIA REPAIR  2004  . US ECHOCARDIOGRAPHY  05/08/2008   EF 55-60%  . VENTRAL HERNIA REPAIR  06/15/2012   Procedure: LAPAROSCOPIC VENTRAL HERNIA;  Surgeon: Edward Jolly, MD;  Location: [facility];  Service: General;  Laterality: N/A;  LAPAROSCOPIC REPAIR VENTRAL HERNIA      Past Medical History:  Diagnosis Date  . Allergic rhinitis   . Asthma   . Breast nodule    right breast, to see dr Helane Rima 06-20-2012  for   . Chest pain, non-cardiac   . Chronic headaches   . Cigarette smoker   . Complication of anesthesia 05-17-2012   trouble breathing after colonscopy, needed nebulizer  . Diverticulosis   . GERD (gastroesophageal reflux disease)   . Hepatic hemangioma   . Hypertension   . IBS (irritable bowel syndrome)   . Obesity   . Prosthetic eye globe   . Pyloric erosion   . Sleep apnea    CPAP setting varies from 4-10  . Type 2 diabetes mellitus ([place]) 03/11/2017      Review of Systems  Constitutional: Negative for chills,  fatigue, fever and unexpected weight change.  Respiratory: Negative for cough, chest tightness and shortness of breath.        Positive for mass of right breast.      Objective:    BP 132/76 (BP Location: Left Arm, Patient Position: Sitting, Cuff Size: Large)   Pulse 83   Temp 98.4 F (36.9 C) (Oral)   Resp 16   Ht '5\' 5"'$  (1.651 m)   Wt 267 lb (121.1 kg)   SpO2 97%   BMI 44.43 kg/m  Nursing note and vital signs reviewed.  Physical Exam  Constitutional: She is oriented to person, place, and time. She appears well-developed and well-nourished. No distress.  Cardiovascular: Normal rate, regular rhythm, normal heart sounds and intact distal pulses.   Pulmonary/Chest: Effort normal and breath sounds normal. No respiratory distress. She has no wheezes. She has no rales. She exhibits no tenderness. Right breast exhibits mass and tenderness. Right breast exhibits no inverted nipple, no nipple discharge and no skin change. Left breast exhibits no inverted nipple, no mass, no nipple discharge, no skin change and no tenderness.    Neurological: She is alert and oriented to person, place, and time.  Skin: Skin is warm and dry.  Psychiatric: She has a normal mood and affect. Her behavior is normal. Judgment and thought content normal.       Assessment & Plan:   Problem List Items Addressed This Visit      Other   Breast mass    New onset breast mass located at the  2-3 o'clock position with concern for cyst versus mass. Obtain diagnostic mammogram and ultrasound. Start doxycycline to cover for mastitis or other related infection. Follow up and additional treatment pending imaging results.       Relevant Orders   MM DIAG BREAST TOMO BILATERAL   US BREAST LTD UNI LEFT INC AXILLA   US BREAST LTD UNI RIGHT INC AXILLA       I am having [name] start on doxycycline and acetaminophen-codeine. I am also having her maintain her spironolactone, atorvastatin, dicyclomine, doxazosin, calcium carbonate, ONE TOUCH SURESOFT, ONE TOUCH ULTRA MINI, ONE TOUCH LANCETS, buPROPion, multivitamin with minerals, fluticasone, aspirin, clopidogrel, Insulin Pen Needle, losartan, omeprazole, carvedilol, acetaminophen, liraglutide, glucose blood, and albuterol.   Meds ordered this encounter  Medications  . doxycycline (VIBRA-TABS) 100 MG tablet    Sig: Take 1 tablet (100 mg total) by mouth 2 (two) times daily.    Dispense:  20 tablet    Refill:  0    Order Specific Question:   Supervising Provider    Answer:   Pricilla Holm A [[id number]]  . acetaminophen-codeine (TYLENOL #3) 300-30 MG tablet    Sig: Take 1 tablet by mouth every 4 (four) hours as needed for moderate pain.    Dispense:  60 tablet    Refill:  0    Order Specific Question:   Supervising Provider    Answer:   Pricilla Holm A [[id number]]     Follow-up: Return in about 3 weeks (around 08/24/2017), or if symptoms worsen or fail to improve.  Mauricio Po, FNP

## 2017-08-05 ENCOUNTER — Ambulatory Visit (INDEPENDENT_AMBULATORY_CARE_PROVIDER_SITE_OTHER): Payer: Managed Care, Other (non HMO) | Admitting: Physician Assistant

## 2017-08-05 VITALS — BP 116/66 | HR 77 | Temp 98.3°F | Ht 65.0 in | Wt 263.0 lb

## 2017-08-05 DIAGNOSIS — IMO0001 Reserved for inherently not codable concepts without codable children: Secondary | ICD-10-CM

## 2017-08-05 DIAGNOSIS — Z6841 Body Mass Index (BMI) 40.0 and over, adult: Secondary | ICD-10-CM

## 2017-08-05 DIAGNOSIS — I10 Essential (primary) hypertension: Secondary | ICD-10-CM | POA: Diagnosis not present

## 2017-08-05 DIAGNOSIS — E1121 Type 2 diabetes mellitus with diabetic nephropathy: Secondary | ICD-10-CM | POA: Diagnosis not present

## 2017-08-05 DIAGNOSIS — E669 Obesity, unspecified: Secondary | ICD-10-CM

## 2017-08-05 MED ORDER — LIRAGLUTIDE 18 MG/3ML ~~LOC~~ SOPN: 1.2000 mg | PEN_INJECTOR | Freq: Every morning | SUBCUTANEOUS | 0 refills | Status: DC

## 2017-08-05 NOTE — Progress Notes (Signed)
Office: [phone]  /  Fax: [phone]   HPI:   Chief Complaint: OBESITY Lisa [name] is here to discuss her progress with her obesity treatment plan. She is on the Category 3 plan and is following her eating plan approximately 65 to 70 % of the time. She states she is exercising 0 minutes 0 times per week. Lisa [name] has not been taking her diuretic and feels she is retaining fluid. She is making smarter food choices but is having a hard time planning meals ahead. Her weight is 263 lb (119.3 kg) today and has had a weight gain of 3 pounds over a period of 1 week since her last visit. She has lost 8 lbs since starting treatment with [name].  Diabetes II with Diabetic Nephropathy Lisa [name] has a diagnosis of diabetes type II. [name] states fasting BGs range in the 120's approximately and denies any hypoglycemic episodes. Last A1c improved to 6.3 She has been working on intensive lifestyle modifications including diet, exercise, and weight loss to help control her blood glucose levels.  Hypertension Lisa [name] is a 56 y.o. female with hypertension and has not been on spironolactone due to costs. She will get today. Lisa [name] denies chest pain or shortness of breath on exertion. She is working weight loss to help control her blood pressure with the goal of decreasing her risk of heart attack and stroke. Lisa [name] blood pressure is currently controlled.  ALLERGIES: Allergies  Allergen Reactions  . Almond Oil Anaphylaxis and Nausea And Vomiting  . Apple Anaphylaxis  . Ceftin [Cefuroxime] Other (See Comments)    Severe stomach pain - Diverticulitis flares up  . Cherry Extract Anaphylaxis  . Other Anaphylaxis and Itching    Most fruits- Are NOT tolerated (PLEASE ASK PATIENT BEFORE GIVING, AS CERTAIN FRUITS ARE TOLERATED IN LIMITED QUANTITIES!!)  . Guaifenesin & Derivatives Other (See Comments)    Restlessness, jittery  . Hctz [Hydrochlorothiazide] Other (See Comments)    Causes a headache  .  Morphine And Related Other (See Comments)    Moodiness.  Causes anger.  . Naproxen Other (See Comments)    Insomnia  . Lisinopril Cough         MEDICATIONS: Current Outpatient Prescriptions on File Prior to Visit  Medication Sig Dispense Refill  . acetaminophen (TYLENOL) 325 MG tablet Take 650 mg by mouth every 6 (six) hours as needed (for pain).    Marland Kitchen acetaminophen-codeine (TYLENOL #3) 300-30 MG tablet Take 1 tablet by mouth every 4 (four) hours as needed for moderate pain. 60 tablet 0  . albuterol (PROVENTIL HFA;VENTOLIN HFA) 108 (90 Base) MCG/ACT inhaler Inhale 2 puffs into the lungs every 6 (six) hours as needed for wheezing or shortness of breath. 1 Inhaler 3  . aspirin 325 MG EC tablet Take 1 tablet (325 mg total) by mouth daily. 30 tablet 10  . atorvastatin (LIPITOR) 40 MG tablet Take 1 tablet (40 mg total) by mouth daily. 90 tablet 3  . Blood Glucose Monitoring Suppl (ONE TOUCH ULTRA MINI) w/Device KIT Use meter to check blood sugars 1-4 times daily as instructed. 1 each 0  . buPROPion (WELLBUTRIN SR) 150 MG 12 hr tablet Take 150 mg by mouth in the morning  0  . calcium carbonate (TUMS - DOSED IN MG ELEMENTAL CALCIUM) 500 MG chewable tablet Chew 1-3 tablets by mouth daily as needed for indigestion or heartburn.     . carvedilol (COREG) 25 MG tablet TAKE 1 TABLET (25 MG TOTAL) BY MOUTH 2 (  TWO) TIMES DAILY. 180 tablet 3  . clopidogrel (PLAVIX) 75 MG tablet Take 1 tablet (75 mg total) by mouth daily. 30 tablet 11  . dicyclomine (BENTYL) 10 MG capsule TAKE 1 CAPSULE (10 MG TOTAL) BY MOUTH 3 (THREE) TIMES DAILY AS NEEDED FOR SPASMS. 90 capsule 3  . doxazosin (CARDURA) 2 MG tablet Take 2 mg by mouth 2 (two) times daily.     Marland Kitchen doxycycline (VIBRA-TABS) 100 MG tablet Take 1 tablet (100 mg total) by mouth 2 (two) times daily. 20 tablet 0  . fluticasone (FLONASE) 50 MCG/ACT nasal spray Place 2 sprays into both nostrils daily as needed for allergies or rhinitis.    Marland Kitchen glucose blood (ONE TOUCH  ULTRA TEST) test strip 1 each by Other route as directed. Use one strip per test. Test blood sugars 1-4 times daily as instructed. 100 each 0  . Insulin Pen Needle 32G X 4 MM MISC 1 Package by Does not apply route every morning. 100 each 0  . Lancets Misc. (ONE TOUCH SURESOFT) MISC Use 1 lancet per test. Test blood sugars 1-4 times per day as instructed. 100 each 1  . losartan (COZAAR) 100 MG tablet TAKE 1 TABLET (100 MG TOTAL) BY MOUTH DAILY. 90 tablet 1  . Multiple Vitamin (MULTIVITAMIN WITH MINERALS) TABS tablet Take 1 tablet by mouth daily.    Marland Kitchen omeprazole (PRILOSEC) 40 MG capsule TAKE 1 CAPSULE (40 MG TOTAL) BY MOUTH 2 (TWO) TIMES DAILY. (Patient taking differently: Take 40 mg by mouth See admin instructions. ONE TO TWO TIMES A DAY) 180 capsule 0  . ONE TOUCH LANCETS MISC Use to check blood sugars twice a day 200 each 1  . spironolactone (ALDACTONE) 50 MG tablet Take 1 tablet (50 mg total) by mouth daily. 90 tablet 2   No current facility-administered medications on file prior to visit.     PAST MEDICAL HISTORY: Past Medical History:  Diagnosis Date  . Allergic rhinitis   . Asthma   . Breast nodule    right breast, to see dr Helane Rima 06-20-2012 for   . Chest pain, non-cardiac   . Chronic headaches   . Cigarette smoker   . Complication of anesthesia 05-17-2012   trouble breathing after colonscopy, needed nebulizer  . Diverticulosis   . GERD (gastroesophageal reflux disease)   . Hepatic hemangioma   . Hypertension   . IBS (irritable bowel syndrome)   . Obesity   . Prosthetic eye globe   . Pyloric erosion   . Sleep apnea    CPAP setting varies from 4-10  . Type 2 diabetes mellitus ([place]) 03/11/2017    PAST SURGICAL HISTORY: Past Surgical History:  Procedure Laterality Date  . ABDOMINAL AORTOGRAM W/LOWER EXTREMITY N/A 04/30/2017   Procedure: Abdominal Aortogram w/Lower Extremity;  Surgeon: Elam Dutch, MD;  Location: [facility];  Service: Cardiovascular;  Laterality:  N/A;  . CARDIAC CATHETERIZATION  12/28/2007   EF 75%. IT REVEALS NORMAL/SUPRANORMAL LEFT VENTRICULAR SYSTOLIC FUNCTION  . CARDIOVASCULAR STRESS TEST  03/25/2007   EF 78%  . CESAREAN SECTION  2003, 2009   x 2  . CHOLECYSTECTOMY  1993  . COLONOSCOPY  2014  . ENDOMETRIAL ABLATION  09/2010   and D&C  . HERNIA REPAIR  0258   umbilical  . LAPAROSCOPY    . PERIPHERAL VASCULAR BALLOON ANGIOPLASTY Left 04/30/2017   Procedure: Peripheral Vascular Balloon Angioplasty;  Surgeon: Elam Dutch, MD;  Location: [facility];  Service: Cardiovascular;  Laterality:  Left;  SFA  . TUBAL LIGATION    . UMBILICAL HERNIA REPAIR  2004  . US ECHOCARDIOGRAPHY  05/08/2008   EF 55-60%  . VENTRAL HERNIA REPAIR  06/15/2012   Procedure: LAPAROSCOPIC VENTRAL HERNIA;  Surgeon: Mariella Saa, MD;  Location: [facility];  Service: General;  Laterality: N/A;  LAPAROSCOPIC REPAIR VENTRAL HERNIA    SOCIAL HISTORY: Social History  Substance Use Topics  . Smoking status: Current Every Day Smoker    Packs/day: 1.00    Years: 25.00    Types: Cigarettes    Last attempt to quit: 09/18/2016  . Smokeless tobacco: Never Used  . Alcohol use 0.0 oz/week     Comment: very rare    FAMILY HISTORY: Family History  Problem Relation Age of Onset  . Hypertension Mother   . Lung cancer Mother   . Diabetes Maternal Grandmother   . Heart disease Father   . Heart attack Paternal Uncle   . Breast cancer Unknown   . Colon cancer Neg Hx     ROS: Review of Systems  Constitutional: Negative for weight loss.  Respiratory: Negative for shortness of breath (on exertion).   Cardiovascular: Negative for chest pain.  Endo/Heme/Allergies:       Negative hypoglycemia    PHYSICAL EXAM: Blood pressure 116/66, pulse 77, temperature 98.3 F (36.8 C), temperature source Oral, height 5\' 5"  (1.651 m), weight 263 lb (119.3 kg), SpO2 98 %. Body mass index is 43.77 kg/m. Physical Exam  Constitutional: She is oriented to person,  place, and time. She appears well-developed and well-nourished.  Cardiovascular: Normal rate.   Pulmonary/Chest: Effort normal.  Musculoskeletal: Normal range of motion.  Neurological: She is oriented to person, place, and time.  Skin: Skin is warm and dry.  Psychiatric: She has a normal mood and affect. Her behavior is normal.  Vitals reviewed.   RECENT LABS AND TESTS: BMET    Component Value Date/Time   NA 143 06/23/2017 1247   NA 138 04/22/2017 1145   K 3.6 06/23/2017 1247   CL 106 06/23/2017 1247   CO2 24 06/23/2017 1221   GLUCOSE 132 (H) 06/23/2017 1247   BUN 27 (H) 06/23/2017 1247   BUN 19 04/22/2017 1145   CREATININE 1.20 (H) 06/23/2017 1247   CREATININE 1.08 (H) 11/12/2016 1040   CALCIUM 9.5 06/23/2017 1221   GFRNONAA 52 (L) 06/23/2017 1221   GFRAA 60 (L) 06/23/2017 1221   Lab Results  Component Value Date   HGBA1C 6.3 07/30/2017   HGBA1C 7.0 (H) 04/22/2017   HGBA1C 7.7 (H) 03/11/2017   HGBA1C 6.2 02/13/2014   Lab Results  Component Value Date   INSULIN 19.6 04/22/2017   CBC    Component Value Date/Time   WBC 7.7 06/23/2017 1221   RBC 4.78 06/23/2017 1221   HGB 14.6 06/23/2017 1247   HGB 14.3 04/22/2017 1145   HCT 43.0 06/23/2017 1247   HCT 42.3 04/22/2017 1145   PLT 228 06/23/2017 1221   MCV 88.1 06/23/2017 1221   MCV 90 04/22/2017 1145   MCH 30.1 06/23/2017 1221   MCHC 34.2 06/23/2017 1221   RDW 13.6 06/23/2017 1221   RDW 13.9 04/22/2017 1145   LYMPHSABS 2.4 06/23/2017 1221   LYMPHSABS 2.5 04/22/2017 1145   MONOABS 0.5 06/23/2017 1221   EOSABS 0.1 06/23/2017 1221   EOSABS 0.1 04/22/2017 1145   BASOSABS 0.0 06/23/2017 1221   BASOSABS 0.0 04/22/2017 1145   Iron/TIBC/Ferritin/ %Sat No results found for: IRON, TIBC, FERRITIN, IRONPCTSAT  Lipid Panel     Component Value Date/Time   CHOL 130 04/22/2017 1145   TRIG 141 04/22/2017 1145   HDL 25 (L) 04/22/2017 1145   CHOLHDL 6 05/26/2016 1134   VLDL 26.0 05/26/2016 1134   LDLCALC 77 04/22/2017  1145   Hepatic Function Panel     Component Value Date/Time   PROT 7.3 06/23/2017 1221   PROT 7.5 04/22/2017 1145   ALBUMIN 3.6 06/23/2017 1221   ALBUMIN 4.3 04/22/2017 1145   AST 17 06/23/2017 1221   ALT 18 06/23/2017 1221   ALKPHOS 85 06/23/2017 1221   BILITOT 0.6 06/23/2017 1221   BILITOT 0.4 04/22/2017 1145   BILIDIR 0.1 02/28/2016 1414     ASSESSMENT AND PLAN: Type 2 diabetes mellitus with diabetic nephropathy, without long-term current use of insulin ([place]) - Plan: liraglutide 18 MG/3ML SOPN  Essential hypertension  Class 3 obesity with serious comorbidity and body mass index (BMI) of 40.0 to 44.9 in adult, unspecified obesity type ([place])  PLAN:  Diabetes II with Diabetic Nephropathy Monesha has been given extensive diabetes education by myself today including ideal fasting and post-prandial blood glucose readings, individual ideal Hgb A1c goals  and hypoglycemia prevention. We discussed the importance of good blood sugar control to decrease the likelihood of diabetic complications such as nephropathy, neuropathy, limb loss, blindness, coronary artery disease, and death. We discussed the importance of intensive lifestyle modification including diet, exercise and weight loss as the first line treatment for diabetes. Delois agrees to increase victoza to 1.2 mg, we will refill for 2 pens and she will follow up at the agreed upon time.  Hypertension We discussed sodium restriction, working on healthy weight loss, and a regular exercise program as the means to achieve improved blood pressure control. Amirah agreed with this plan and agreed to follow up as directed. We will continue to monitor her blood pressure as well as her progress with the above lifestyle modifications. She agrees to continue her medications as prescribed and to take all medications. She will watch for signs of hypotension as she continues her lifestyle modifications.  Obesity Yovanna is currently in the action stage  of change. As such, her goal is to continue with weight loss efforts She has agreed to follow the Category 3 plan Sybrina has been instructed to work up to a goal of 150 minutes of combined cardio and strengthening exercise per week for weight loss and overall health benefits. We discussed the following Behavioral Modification Strategies today: increasing lean protein intake and work on meal planning and easy cooking plans  Chanell has agreed to follow up with our clinic in 2 weeks. She was informed of the importance of frequent follow up visits to maximize her success with intensive lifestyle modifications for her multiple health conditions.  I, Doreene Nest, am acting as transcriptionist for Lacy Duverney, PA-C  I have reviewed the above documentation for accuracy and completeness, and I agree with the above. -Lacy Duverney, PA-C  I have reviewed the above note and agree with the plan. -Dennard Nip, MD   OBESITY BEHAVIORAL INTERVENTION VISIT  Today's visit was # 6 out of 22.  Starting weight: 271 lbs Starting date: 04/22/17 Today's weight : 263 lbs Today's date: 08/05/2017 Total lbs lost to date: 8 (Patients must lose 7 lbs in the first 6 months to continue with counseling)   ASK: We discussed the diagnosis of obesity with Lisa [name] today and Jabrea agreed to give [name] permission to discuss obesity  behavioral modification therapy today.  ASSESS: Brenetta has the diagnosis of obesity and her BMI today is 43.77 Lashawnna is in the action stage of change   ADVISE: Pari was educated on the multiple health risks of obesity as well as the benefit of weight loss to improve her health. She was advised of the need for long term treatment and the importance of lifestyle modifications.  AGREE: Multiple dietary modification options and treatment options were discussed and  Shaketha agreed to follow the Category 3 plan We discussed the following Behavioral Modification Strategies today: increasing lean  protein intake and work on meal planning and easy cooking plans

## 2017-08-07 ENCOUNTER — Encounter (HOSPITAL_COMMUNITY): Payer: Self-pay | Admitting: *Deleted

## 2017-08-10 ENCOUNTER — Encounter: Payer: Self-pay | Admitting: Diagnostic Neuroimaging

## 2017-08-10 ENCOUNTER — Ambulatory Visit (INDEPENDENT_AMBULATORY_CARE_PROVIDER_SITE_OTHER): Payer: Managed Care, Other (non HMO) | Admitting: Diagnostic Neuroimaging

## 2017-08-10 ENCOUNTER — Encounter: Payer: Self-pay | Admitting: Vascular Surgery

## 2017-08-10 VITALS — BP 127/71 | HR 81 | Ht 65.0 in | Wt 265.8 lb

## 2017-08-10 DIAGNOSIS — G43009 Migraine without aura, not intractable, without status migrainosus: Secondary | ICD-10-CM | POA: Diagnosis not present

## 2017-08-10 DIAGNOSIS — G459 Transient cerebral ischemic attack, unspecified: Secondary | ICD-10-CM | POA: Diagnosis not present

## 2017-08-10 NOTE — Patient Instructions (Signed)
  TRANSIENT APHASIA (TIA vs COMPLICATED MIGRAINE) - check carotid ultrasound and TTE (echo) - continue plavix 75mg  + aspirin 325mg  - continue BP, diabetes and lipid control - try to stop smoking   MIGRAINE TREATMENT - consider topiramate, botox for migraine or CGRP antagonist for migraine prevention - tylenol as needed for HA (will avoid triptans due to recent possible TIA)

## 2017-08-10 NOTE — Progress Notes (Signed)
[facility]  PATIENT: Lisa [name] DOB: February 26, 1961  REFERRING CLINICIAN:  HISTORY FROM: patient REASON FOR VISIT: follow up   HISTORICAL  CHIEF COMPLAINT:  Chief Complaint  Patient presents with  . New Consult  Er Referral  . Headache  . Dizziness    HISTORY OF PRESENT ILLNESS:   UPDATE 08/10/17: Since last visit, patient was doing better in 2014-2015, then stopped following up in our clinic. Overall her prior severe HA have essentially resolved. Her mother passed away in 01-16-14, and patient was under high stress prior to her mother passing away. 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   In 06/22/17, had 30 second episode of inability to speak, with generalized tremors and shakiness, but no loss of consciousness, CP, SOB, numbness, weakness. Then felt head pressure sensation. Went to ER the next day for evaluation due to persistent headache. Had MRI brain which was negative for acute stroke. Then d/c home with follow up in neurology clinic.   Since then having intermittent HA. Using tylenol as needed for HA. Has phonophobia. No nausea, vomiting, photophobia. No vision changes with HA. HA are occurring 8 days per month.   UPDATE 07/26/13: Since last visit patient's headaches have slightly changed. Now she has a throbbing bilateral temporal, facial, neck pain and headache sensation. No nausea vomiting. She has a sensitivity to light. Sometimes wakes up in the middle the night with these headaches. No more thunderclap headaches. Patient is on CPAP for sleep apnea. Patient is also asking about possible Bell's palsy, which her grandson's endocrinologist was concerned may have developed over the past 3 months. Patient denies any new unilateral facial weakness or numbness.  UPDATE 04/15/12: Since last visit, HA reduced. Had 2 HA since last visit. Not taken any meds for this. Questions about dx and tx reviewed again. No  aggravating factors.  PRIOR HPI (03/01/12): 56 year old left-handed female with history of hypertension, sleep apnea, here for evaluation of increasing headaches.  Patient has at least 10 year history of intermittent headaches.  Her typical headaches consist of pressure and throbbing sensation, nausea.  Patient developed a new type of headache in 17-Jan-2012 while straining on the bathroom.  She states felt a sensation of "fabric ripping" in her head.  Peak headache intensity was quite rapid.  After 30 min. headaches started to reduced in severity.  Few days later patient had developed a similar headache sensation while having sexual intercourse near climax. Patient has had CT of the head, MRI brain, MRA head since that time.  She is taking hydrocodone with Tylenol as needed for headache.  Since that time her headaches have subsided, but not fully recovered.  No specific triggering factors.   REVIEW OF SYSTEMS: Full 14 system review of systems performed and negative except: ringing in ears dizziness joint pain blood in urine.   ALLERGIES: Allergies  Allergen Reactions  . Almond Oil Anaphylaxis and Nausea And Vomiting  . Apple Anaphylaxis  . Ceftin [Cefuroxime] Other (See Comments)    Severe stomach pain - Diverticulitis flares up  . Cherry Extract Anaphylaxis  . Other Anaphylaxis and Itching    Most fruits- Are NOT tolerated (PLEASE ASK PATIENT BEFORE GIVING, AS CERTAIN FRUITS ARE TOLERATED IN LIMITED QUANTITIES!!)  . Guaifenesin & Derivatives Other (See Comments)    Restlessness, jittery  . Hctz [Hydrochlorothiazide] Other (See Comments)    Causes a headache  . Morphine And Related Other (See Comments)  Moodiness.  Causes anger.  . Naproxen Other (See Comments)    Insomnia  . Lisinopril Cough         HOME MEDICATIONS:  Outpatient Medications Prior to Visit  Medication Sig Dispense Refill  . acetaminophen (TYLENOL) 325 MG tablet Take 650 mg by mouth every 6 (six) hours as needed  (for pain).    Marland Kitchen acetaminophen-codeine (TYLENOL #3) 300-30 MG tablet Take 1 tablet by mouth every 4 (four) hours as needed for moderate pain. 60 tablet 0  . albuterol (PROVENTIL HFA;VENTOLIN HFA) 108 (90 Base) MCG/ACT inhaler Inhale 2 puffs into the lungs every 6 (six) hours as needed for wheezing or shortness of breath. 1 Inhaler 3  . aspirin 325 MG EC tablet Take 1 tablet (325 mg total) by mouth daily. 30 tablet 10  . atorvastatin (LIPITOR) 40 MG tablet Take 1 tablet (40 mg total) by mouth daily. 90 tablet 3  . Blood Glucose Monitoring Suppl (ONE TOUCH ULTRA MINI) w/Device KIT Use meter to check blood sugars 1-4 times daily as instructed. 1 each 0  . buPROPion (WELLBUTRIN SR) 150 MG 12 hr tablet Take 150 mg by mouth in the morning  0  . calcium carbonate (TUMS - DOSED IN MG ELEMENTAL CALCIUM) 500 MG chewable tablet Chew 1-3 tablets by mouth daily as needed for indigestion or heartburn.     . carvedilol (COREG) 25 MG tablet TAKE 1 TABLET (25 MG TOTAL) BY MOUTH 2 (TWO) TIMES DAILY. 180 tablet 3  . clopidogrel (PLAVIX) 75 MG tablet Take 1 tablet (75 mg total) by mouth daily. 30 tablet 11  . dicyclomine (BENTYL) 10 MG capsule TAKE 1 CAPSULE (10 MG TOTAL) BY MOUTH 3 (THREE) TIMES DAILY AS NEEDED FOR SPASMS. 90 capsule 3  . doxazosin (CARDURA) 2 MG tablet Take 2 mg by mouth 2 (two) times daily.     Marland Kitchen doxycycline (VIBRA-TABS) 100 MG tablet Take 1 tablet (100 mg total) by mouth 2 (two) times daily. 20 tablet 0  . fluticasone (FLONASE) 50 MCG/ACT nasal spray Place 2 sprays into both nostrils daily as needed for allergies or rhinitis.    Marland Kitchen glucose blood (ONE TOUCH ULTRA TEST) test strip 1 each by Other route as directed. Use one strip per test. Test blood sugars 1-4 times daily as instructed. 100 each 0  . Insulin Pen Needle 32G X 4 MM MISC 1 Package by Does not apply route every morning. 100 each 0  . Lancets Misc. (ONE TOUCH SURESOFT) MISC Use 1 lancet per test. Test blood sugars 1-4 times per day as  instructed. 100 each 1  . liraglutide 18 MG/3ML SOPN Inject 0.2 mLs (1.2 mg total) into the skin every morning. 2 pen 0  . losartan (COZAAR) 100 MG tablet TAKE 1 TABLET (100 MG TOTAL) BY MOUTH DAILY. 90 tablet 1  . Multiple Vitamin (MULTIVITAMIN WITH MINERALS) TABS tablet Take 1 tablet by mouth daily.    Marland Kitchen omeprazole (PRILOSEC) 40 MG capsule TAKE 1 CAPSULE (40 MG TOTAL) BY MOUTH 2 (TWO) TIMES DAILY. (Patient taking differently: Take 40 mg by mouth See admin instructions. ONE TO TWO TIMES A DAY) 180 capsule 0  . ONE TOUCH LANCETS MISC Use to check blood sugars twice a day 200 each 1  . spironolactone (ALDACTONE) 50 MG tablet Take 1 tablet (50 mg total) by mouth daily. 90 tablet 2   No facility-administered medications prior to visit.     PAST MEDICAL HISTORY: Past Medical History:  Diagnosis Date  . Allergic  rhinitis   . Asthma   . Breast nodule    right breast, to see dr Helane Rima 06-20-2012 for   . Chest pain, non-cardiac   . Chronic headaches   . Cigarette smoker   . Complication of anesthesia 05-17-2012   trouble breathing after colonscopy, needed nebulizer  . Diverticulosis   . GERD (gastroesophageal reflux disease)   . Hepatic hemangioma   . Hypertension   . IBS (irritable bowel syndrome)   . Obesity   . Prosthetic eye globe   . Pyloric erosion   . Sleep apnea    CPAP setting varies from 4-10  . Type 2 diabetes mellitus ([place]) 03/11/2017    PAST SURGICAL HISTORY: Past Surgical History:  Procedure Laterality Date  . ABDOMINAL AORTOGRAM W/LOWER EXTREMITY N/A 04/30/2017   Procedure: Abdominal Aortogram w/Lower Extremity;  Surgeon: Elam Dutch, MD;  Location: [facility];  Service: Cardiovascular;  Laterality: N/A;  . CARDIAC CATHETERIZATION  12/28/2007   EF 75%. IT REVEALS NORMAL/SUPRANORMAL LEFT VENTRICULAR SYSTOLIC FUNCTION  . CARDIOVASCULAR STRESS TEST  03/25/2007   EF 78%  . CESAREAN SECTION  2003, 2009   x 2  . CHOLECYSTECTOMY  1993  . COLONOSCOPY  2014  .  ENDOMETRIAL ABLATION  09/2010   and D&C  . HERNIA REPAIR  0626   umbilical  . LAPAROSCOPY    . PERIPHERAL VASCULAR BALLOON ANGIOPLASTY Left 04/30/2017   Procedure: Peripheral Vascular Balloon Angioplasty;  Surgeon: Elam Dutch, MD;  Location: [facility];  Service: Cardiovascular;  Laterality: Left;  SFA  . TUBAL LIGATION    . UMBILICAL HERNIA REPAIR  2004  . US ECHOCARDIOGRAPHY  05/08/2008   EF 55-60%  . VENTRAL HERNIA REPAIR  06/15/2012   Procedure: LAPAROSCOPIC VENTRAL HERNIA;  Surgeon: Edward Jolly, MD;  Location: [facility];  Service: General;  Laterality: N/A;  LAPAROSCOPIC REPAIR VENTRAL HERNIA    FAMILY HISTORY: Family History  Problem Relation Age of Onset  . Hypertension Mother   . Lung cancer Mother   . Diabetes Maternal Grandmother   . Heart disease Father   . Heart attack Paternal Uncle   . Breast cancer Unknown   . Colon cancer Neg Hx     SOCIAL HISTORY:  Social History   Social History  . Marital status: Married    Spouse name: Science writer  . Number of children: 2  . Years of education: 14+   Occupational History  . 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 Smoking status: Current Every Day Smoker    Packs/day: 1.00    Years: 25.00    Types: Cigarettes    Last attempt to quit: 09/18/2016  . Smokeless tobacco: Never Used  . Alcohol use 0.0 oz/week     Comment: very rare  . Drug use: No  . Sexual activity: Not Currently   Other Topics Concern  . Not on file   Social History Narrative   Patient lives at her home with her family.   Caffeine Use: 1 soda occasionally        PHYSICAL EXAM  GENERAL EXAM/CONSTITUTIONAL: Vitals:  Vitals:   08/10/17 1057  BP: 127/71  Pulse: 81  Weight: 265 lb 12.8 oz (120.6 kg)  Height: _0  (1.651 m)     Patient is in no distress; well developed, nourished and groomed; neck is supple  CARDIOVASCULAR:  Examination of carotid arteries is normal; no carotid bruits  Regular  rate and rhythm, no murmurs  Examination of peripheral vascular system by observation and palpation is normal  EYES:  Ophthalmoscopic exam of optic discs and posterior segments is normal; no papilledema or hemorrhages  MUSCULOSKELETAL:  Gait, strength, tone, movements noted in Neurologic exam below  NEUROLOGIC: MENTAL STATUS:   awake, alert, oriented to person, place and time  recent and remote memory intact  normal attention and concentration  language fluent, comprehension intact, naming intact,   fund of knowledge appropriate  CRANIAL NERVE:   2nd - no papilledema on fundoscopic exam  2nd, 3rd, 4th, 6th - LEFT EYE CONGENTIAL MALFORMATION WITH PROSTHESIS. NO LIGHT PERCEPTION. NO RXN TO LIGHT. DECREASED LEFT EYE MOVEMENTS. LEFT EYELID LOWER THAN RIGHT. RIGHT LOWER FACIAL MOVEMENTS DECREASED, WITH DECR RIGHT NL FOLD. APPEARS SIMILAR TO [address] LICENCE PICTURE. RIGHT PUPIUL REACTIVE. Visual field IN RIGHT EYE NORMAL. Visual fields full to confrontation, extraocular muscles intact, no nystagmus  5th - facial sensation symmetric  7th - facial strength symmetric  8th - hearing intact  9th - palate elevates symmetrically, uvula midline  11th - shoulder shrug symmetric  12th - tongue protrusion midline  MOTOR:   normal bulk and tone, full strength in the BUE, BLE; DECR RIGHT HAND GRIP DUE TO PAIN  SENSORY:   normal and symmetric to light touch  COORDINATION:   finger-nose-finger, fine finger movements SLOW ON RIGHT HAND  REFLEXES:   deep tendon reflexes TRACE and symmetric  GAIT/STATION:   narrow based gait; ANTALGIC     DIAGNOSTIC DATA (LABS, IMAGING, TESTING) - I reviewed patient records, labs, notes, testing and imaging myself where available.  Lab Results  Component Value Date   WBC 7.7 06/23/2017   HGB 14.6 06/23/2017   HCT 43.0 06/23/2017   MCV 88.1 06/23/2017   PLT 228 06/23/2017      Component Value Date/Time   NA 143 06/23/2017  1247   NA 138 04/22/2017 1145   K 3.6 06/23/2017 1247   CL 106 06/23/2017 1247   CO2 24 06/23/2017 1221   GLUCOSE 132 (H) 06/23/2017 1247   BUN 27 (H) 06/23/2017 1247   BUN 19 04/22/2017 1145   CREATININE 1.20 (H) 06/23/2017 1247   CREATININE 1.08 (H) 11/12/2016 1040   CALCIUM 9.5 06/23/2017 1221   PROT 7.3 06/23/2017 1221   PROT 7.5 04/22/2017 1145   ALBUMIN 3.6 06/23/2017 1221   ALBUMIN 4.3 04/22/2017 1145   AST 17 06/23/2017 1221   ALT 18 06/23/2017 1221   ALKPHOS 85 06/23/2017 1221   BILITOT 0.6 06/23/2017 1221   BILITOT 0.4 04/22/2017 1145   GFRNONAA 52 (L) 06/23/2017 1221   GFRAA 60 (L) 06/23/2017 1221   Lab Results  Component Value Date   CHOL 130 04/22/2017   Lab Results  Component Value Date   HGBA1C 6.3 07/30/2017   Lab Results  Component Value Date   [id number] 812 04/22/2017   Lab Results  Component Value Date   TSH 1.795 *Test methodology is 3rd generation TSH* 12/27/2007    02/12/12 MRI brain - monoventricle, absent septum pellucidum; stable from MRI on 04/03/08  02/12/12 MRA head - normal  07/25/15 TTE - Normal LV systolic function; grade 1 diastolic dysfunction; mild LAE.  06/23/17 MRI brain [I reviewed images myself and agree with interpretation. -VRP]  1. No acute intracranial infarct or other process identified. 2. 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 No transependymal flow of CSF. Overall, appearance is stable  relative to 2009. 3. Otherwise unremarkable brain MRI.     ASSESSMENT AND PLAN  56 y.o. female with history of headaches since 2003, with new type of headache in February 2013 (primary thunderclap headache and primary coital headache vs. migraine variant).  Now with new transient aphasia event on 12/11/14 (TIA vs complicated migraine).   Also MRI findings (monoventricle) are stable over time.  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   Dx:  Transient cerebral ischemia, unspecified type - Plan: ECHOCARDIOGRAM COMPLETE, VAS US CAROTID  Migraine without aura and without status migrainosus, not intractable    PLAN:  TRANSIENT APHASIA (TIA vs COMPLICATED MIGRAINE) - check carotid u/s and TTE - continue plavix 53m + aspirin 3289m(rx'd for peripheral arterial disease) - continue BP, diabetes and lipid control - try to stop smoking   MIGRAINE TREATMENT - consider topiramate, botox for migraine or CGRP antagonist for migraine prevention - tylenol as needed for HA (will avoid triptans due to recent possible TIA)  Orders Placed This Encounter  Procedures  . ECHOCARDIOGRAM COMPLETE   Return in about 3 months (around 11/09/2017).  I reviewed images, labs, notes, records myself. I summarized findings and reviewed with patient, for this high risk condition (TIA vs complicated migraine) requiring high complexity decision making.    VIPenni BombardMD 08/02/43/[id number]:25M Certified in Neurology, Neurophysiology and Neuroimaging  [facility] [address]SuTyndall AFBrHelenNC [phone]

## 2017-08-11 ENCOUNTER — Ambulatory Visit (INDEPENDENT_AMBULATORY_CARE_PROVIDER_SITE_OTHER): Payer: Managed Care, Other (non HMO) | Admitting: Physician Assistant

## 2017-08-12 ENCOUNTER — Ambulatory Visit (INDEPENDENT_AMBULATORY_CARE_PROVIDER_SITE_OTHER)
Admit: 2017-08-12 | Discharge: 2017-08-12 | Disposition: A | Discharge status: Home / Self Care | Payer: Managed Care, Other (non HMO) | Attending: Vascular Surgery | Admitting: Vascular Surgery

## 2017-08-12 ENCOUNTER — Encounter: Payer: Self-pay | Admitting: Vascular Surgery

## 2017-08-12 ENCOUNTER — Ambulatory Visit (INDEPENDENT_AMBULATORY_CARE_PROVIDER_SITE_OTHER): Payer: Managed Care, Other (non HMO) | Admitting: Vascular Surgery

## 2017-08-12 ENCOUNTER — Ambulatory Visit (HOSPITAL_COMMUNITY)
Admit: 2017-08-12 | Discharge: 2017-08-12 | Disposition: A | Discharge status: Home / Self Care | Payer: Managed Care, Other (non HMO) | Attending: Vascular Surgery | Admitting: Vascular Surgery

## 2017-08-12 VITALS — BP 120/69 | HR 82 | Temp 96.8°F | Ht 65.0 in | Wt 263.0 lb

## 2017-08-12 DIAGNOSIS — Z48812 Encounter for surgical aftercare following surgery on the circulatory system: Secondary | ICD-10-CM | POA: Diagnosis not present

## 2017-08-12 DIAGNOSIS — I739 Peripheral vascular disease, unspecified: Secondary | ICD-10-CM | POA: Diagnosis not present

## 2017-08-12 LAB — VAS US LOWER EXTREMITY ARTERIAL DUPLEX
Left ant tibial distal sys: 50 cm/s
Left super femoral dist sys PSV: -94 cm/s
Left super femoral mid sys PSV: -126 cm/s
Left super femoral prox sys PSV: 153 cm/s
left post tibial dist sys: 59 cm/s

## 2017-08-12 NOTE — Progress Notes (Signed)
Patient is a 56 year old female returns for follow-up today for peripheral arterial disease. She recently underwent angioplasty of a left superficial femoral artery stenosis for claudication symptoms. She states her left leg is much improved. She still complains of some pain in her back with ambulation. She has known lumbar spine disc disease.  Review of systems: She has shortness of breath with exertion. She denies chest pain.  Physical exam:  Vitals:   08/12/17 1053  BP: 120/69  Pulse: 82  Temp: (!) 96.8 F (36 C)  TempSrc: Oral  SpO2: 100%  Weight: 263 lb (119.3 kg)  Height: 5\' 5"  (1.651 m)    Extremities: Dorsalis pedis and posterior tibial pulses bilaterally  Skin: No ulcerations or open wound  Data: Patient had a duplex ultrasound of her left lower extremity today. This showed widely patent left superficial femoral artery.  ABI on the left was 0.94 right was 0.91  Assessment: Doing well status post angioplasty left superficial femoral artery. The patient currently is on Plavix and aspirin. She is wishing to start back on ibuprofen because of her degenerative arthritis problems. I told her today she could stop her Plavix and continue just aspirin alone.  She will follow-up in 3 months time for repeat duplex of her left leg as well as bilateral ABIs. She'll see our nurse practitioner that office visit.  Ruta Hinds, MD [facility] Office: [phone] Pager: [phone]

## 2017-08-16 ENCOUNTER — Ambulatory Visit
Admission: RE | Admit: 2017-08-16 | Discharge: 2017-08-16 | Disposition: A | Discharge status: Home / Self Care | Payer: Managed Care, Other (non HMO) | Source: Ambulatory Visit | Attending: Family | Admitting: Family

## 2017-08-16 ENCOUNTER — Other Ambulatory Visit: Payer: Self-pay | Admitting: Family

## 2017-08-16 DIAGNOSIS — N63 Unspecified lump in unspecified breast: Secondary | ICD-10-CM

## 2017-08-18 ENCOUNTER — Other Ambulatory Visit: Payer: Self-pay

## 2017-08-18 ENCOUNTER — Ambulatory Visit (HOSPITAL_COMMUNITY): Payer: Managed Care, Other (non HMO) | Attending: Diagnostic Neuroimaging

## 2017-08-18 DIAGNOSIS — E119 Type 2 diabetes mellitus without complications: Secondary | ICD-10-CM | POA: Diagnosis not present

## 2017-08-18 DIAGNOSIS — I10 Essential (primary) hypertension: Secondary | ICD-10-CM | POA: Insufficient documentation

## 2017-08-18 DIAGNOSIS — G4730 Sleep apnea, unspecified: Secondary | ICD-10-CM | POA: Diagnosis not present

## 2017-08-18 DIAGNOSIS — G459 Transient cerebral ischemic attack, unspecified: Secondary | ICD-10-CM | POA: Insufficient documentation

## 2017-08-18 DIAGNOSIS — Z720 Tobacco use: Secondary | ICD-10-CM | POA: Insufficient documentation

## 2017-08-19 ENCOUNTER — Ambulatory Visit (INDEPENDENT_AMBULATORY_CARE_PROVIDER_SITE_OTHER): Payer: Managed Care, Other (non HMO) | Admitting: Physician Assistant

## 2017-08-19 VITALS — BP 120/75 | HR 66 | Temp 98.2°F | Ht 65.0 in | Wt 257.0 lb

## 2017-08-19 DIAGNOSIS — Z6841 Body Mass Index (BMI) 40.0 and over, adult: Secondary | ICD-10-CM

## 2017-08-19 DIAGNOSIS — E669 Obesity, unspecified: Secondary | ICD-10-CM

## 2017-08-19 DIAGNOSIS — E119 Type 2 diabetes mellitus without complications: Secondary | ICD-10-CM

## 2017-08-19 DIAGNOSIS — IMO0001 Reserved for inherently not codable concepts without codable children: Secondary | ICD-10-CM

## 2017-08-19 NOTE — Progress Notes (Signed)
Office: [phone]  /  Fax: [phone]   HPI:   Chief Complaint: OBESITY Lisa [name] is here to discuss her progress with her obesity treatment plan. She is on the  follow the Category 3 plan and is following her eating plan approximately 85 % of the time. She states she is exercising 0 minutes 0 times per week. Lisa [name] continues to do well with weight loss. Lisa [name] states she has been taking her diuretic which helped with previous fluid retention. She is motivated about weight loss and has been following the plan. Her weight is 257 lb (116.6 kg) today and has had a weight loss of 6 pounds over a period of 2 weeks since her last visit. She has lost 14 lbs since starting treatment with [name].  Diabetes II Lisa [name] has a diagnosis of diabetes type II. Lisa [name] states fasting BGs range in the 130's and denies any hypoglycemic episodes. She has been working on intensive lifestyle modifications including diet, exercise, and weight loss to help control her blood glucose levels.  ALLERGIES: Allergies  Allergen Reactions  . Almond Oil Anaphylaxis and Nausea And Vomiting    Almond-Nuts Only  . Apple Anaphylaxis  . Ceftin [Cefuroxime] Other (See Comments)    Severe stomach pain - Diverticulitis flares up  . Cherry Extract Anaphylaxis    Cherry fruit only   . Other Anaphylaxis and Itching    Most fruits- Are NOT tolerated (PLEASE ASK PATIENT BEFORE GIVING, AS CERTAIN FRUITS ARE TOLERATED IN LIMITED QUANTITIES!!)  . Guaifenesin & Derivatives Other (See Comments)    Restlessness, jittery  . Hctz [Hydrochlorothiazide] Other (See Comments)    Causes a headache  . Morphine And Related Other (See Comments)    Moodiness.  Causes anger.  . Naproxen Other (See Comments)    Insomnia  . Lisinopril Cough         MEDICATIONS: Current Outpatient Prescriptions on File Prior to Visit  Medication Sig Dispense Refill  . acetaminophen (TYLENOL) 325 MG tablet Take 650 mg by mouth every 6 (six) hours as needed (for  pain).    Marland Kitchen acetaminophen-codeine (TYLENOL #3) 300-30 MG tablet Take 1 tablet by mouth every 4 (four) hours as needed for moderate pain. 60 tablet 0  . albuterol (PROVENTIL HFA;VENTOLIN HFA) 108 (90 Base) MCG/ACT inhaler Inhale 2 puffs into the lungs every 6 (six) hours as needed for wheezing or shortness of breath. 1 Inhaler 3  . aspirin 325 MG EC tablet Take 1 tablet (325 mg total) by mouth daily. 30 tablet 10  . atorvastatin (LIPITOR) 40 MG tablet Take 1 tablet (40 mg total) by mouth daily. 90 tablet 3  . Blood Glucose Monitoring Suppl (ONE TOUCH ULTRA MINI) w/Device KIT Use meter to check blood sugars 1-4 times daily as instructed. 1 each 0  . buPROPion (WELLBUTRIN SR) 150 MG 12 hr tablet Take 150 mg by mouth in the morning  0  . calcium carbonate (TUMS - DOSED IN MG ELEMENTAL CALCIUM) 500 MG chewable tablet Chew 1-3 tablets by mouth daily as needed for indigestion or heartburn.     . carvedilol (COREG) 25 MG tablet TAKE 1 TABLET (25 MG TOTAL) BY MOUTH 2 (TWO) TIMES DAILY. 180 tablet 3  . dicyclomine (BENTYL) 10 MG capsule TAKE 1 CAPSULE (10 MG TOTAL) BY MOUTH 3 (THREE) TIMES DAILY AS NEEDED FOR SPASMS. 90 capsule 3  . doxazosin (CARDURA) 2 MG tablet Take 2 mg by mouth 2 (two) times daily.     . fluticasone (FLONASE) 50  MCG/ACT nasal spray Place 2 sprays into both nostrils daily as needed for allergies or rhinitis.    Marland Kitchen glucose blood (ONE TOUCH ULTRA TEST) test strip 1 each by Other route as directed. Use one strip per test. Test blood sugars 1-4 times daily as instructed. 100 each 0  . liraglutide 18 MG/3ML SOPN Inject 0.2 mLs (1.2 mg total) into the skin every morning. 2 pen 0  . losartan (COZAAR) 100 MG tablet TAKE 1 TABLET (100 MG TOTAL) BY MOUTH DAILY. 90 tablet 1  . Multiple Vitamin (MULTIVITAMIN WITH MINERALS) TABS tablet Take 1 tablet by mouth daily.    Marland Kitchen omeprazole (PRILOSEC) 40 MG capsule TAKE 1 CAPSULE (40 MG TOTAL) BY MOUTH 2 (TWO) TIMES DAILY. (Patient taking differently: Take 40 mg  by mouth See admin instructions. ONE TO TWO TIMES A DAY) 180 capsule 0  . ONE TOUCH LANCETS MISC Use to check blood sugars twice a day 200 each 1  . spironolactone (ALDACTONE) 50 MG tablet Take 1 tablet (50 mg total) by mouth daily. 90 tablet 2   No current facility-administered medications on file prior to visit.     PAST MEDICAL HISTORY: Past Medical History:  Diagnosis Date  . Allergic rhinitis   . Asthma   . Breast nodule    right breast, to see dr Helane Rima 06-20-2012 for   . Chest pain, non-cardiac   . Chronic headaches   . Cigarette smoker   . Complication of anesthesia 05-17-2012   trouble breathing after colonscopy, needed nebulizer  . Diverticulosis   . GERD (gastroesophageal reflux disease)   . Hepatic hemangioma   . Hypertension   . IBS (irritable bowel syndrome)   . Obesity   . Prosthetic eye globe   . Pyloric erosion   . Sleep apnea    CPAP setting varies from 4-10  . Type 2 diabetes mellitus ([place]) 03/11/2017    PAST SURGICAL HISTORY: Past Surgical History:  Procedure Laterality Date  . ABDOMINAL AORTOGRAM W/LOWER EXTREMITY N/A 04/30/2017   Procedure: Abdominal Aortogram w/Lower Extremity;  Surgeon: Elam Dutch, MD;  Location: [facility];  Service: Cardiovascular;  Laterality: N/A;  . CARDIAC CATHETERIZATION  12/28/2007   EF 75%. IT REVEALS NORMAL/SUPRANORMAL LEFT VENTRICULAR SYSTOLIC FUNCTION  . CARDIOVASCULAR STRESS TEST  03/25/2007   EF 78%  . CESAREAN SECTION  2003, 2009   x 2  . CHOLECYSTECTOMY  1993  . COLONOSCOPY  2014  . ENDOMETRIAL ABLATION  09/2010   and D&C  . HERNIA REPAIR  6073   umbilical  . LAPAROSCOPY    . PERIPHERAL VASCULAR BALLOON ANGIOPLASTY Left 04/30/2017   Procedure: Peripheral Vascular Balloon Angioplasty;  Surgeon: Elam Dutch, MD;  Location: [facility];  Service: Cardiovascular;  Laterality: Left;  SFA  . TUBAL LIGATION    . UMBILICAL HERNIA REPAIR  2004  . US ECHOCARDIOGRAPHY  05/08/2008   EF 55-60%  .  VENTRAL HERNIA REPAIR  06/15/2012   Procedure: LAPAROSCOPIC VENTRAL HERNIA;  Surgeon: Edward Jolly, MD;  Location: [facility];  Service: General;  Laterality: N/A;  LAPAROSCOPIC REPAIR VENTRAL HERNIA    SOCIAL HISTORY: Social History  Substance Use Topics  . Smoking status: Current Every Day Smoker    Packs/day: 1.00    Years: 25.00    Types: Cigarettes    Last attempt to quit: 09/18/2016  . Smokeless tobacco: Never Used  . Alcohol use 0.0 oz/week     Comment: very rare  FAMILY HISTORY: Family History  Problem Relation Age of Onset  . Hypertension Mother   . Lung cancer Mother   . Diabetes Maternal Grandmother   . Heart disease Father   . Heart attack Paternal Uncle   . Breast cancer Unknown   . Colon cancer Neg Hx     ROS: Review of Systems  Constitutional: Positive for weight loss.  Endo/Heme/Allergies:       Negative hypoglycemia    PHYSICAL EXAM: Blood pressure 120/75, pulse 66, temperature 98.2 F (36.8 C), temperature source Oral, height '5\' 5"'$  (1.651 m), weight 257 lb (116.6 kg), SpO2 99 %. Body mass index is 42.77 kg/m. Physical Exam  Constitutional: She is oriented to person, place, and time. She appears well-developed and well-nourished.  Cardiovascular: Normal rate.   Pulmonary/Chest: Effort normal.  Musculoskeletal: Normal range of motion.  Neurological: She is oriented to person, place, and time.  Skin: Skin is warm and dry.  Psychiatric: She has a normal mood and affect. Her behavior is normal.  Vitals reviewed.   RECENT LABS AND TESTS: BMET    Component Value Date/Time   NA 143 06/23/2017 1247   NA 138 04/22/2017 1145   K 3.6 06/23/2017 1247   CL 106 06/23/2017 1247   CO2 24 06/23/2017 1221   GLUCOSE 132 (H) 06/23/2017 1247   BUN 27 (H) 06/23/2017 1247   BUN 19 04/22/2017 1145   CREATININE 1.20 (H) 06/23/2017 1247   CREATININE 1.08 (H) 11/12/2016 1040   CALCIUM 9.5 06/23/2017 1221   GFRNONAA 52 (L) 06/23/2017 1221   GFRAA 60 (L)  06/23/2017 1221   Lab Results  Component Value Date   HGBA1C 6.3 07/30/2017   HGBA1C 7.0 (H) 04/22/2017   HGBA1C 7.7 (H) 03/11/2017   HGBA1C 6.2 02/13/2014   Lab Results  Component Value Date   INSULIN 19.6 04/22/2017   CBC    Component Value Date/Time   WBC 7.7 06/23/2017 1221   RBC 4.78 06/23/2017 1221   HGB 14.6 06/23/2017 1247   HGB 14.3 04/22/2017 1145   HCT 43.0 06/23/2017 1247   HCT 42.3 04/22/2017 1145   PLT 228 06/23/2017 1221   MCV 88.1 06/23/2017 1221   MCV 90 04/22/2017 1145   MCH 30.1 06/23/2017 1221   MCHC 34.2 06/23/2017 1221   RDW 13.6 06/23/2017 1221   RDW 13.9 04/22/2017 1145   LYMPHSABS 2.4 06/23/2017 1221   LYMPHSABS 2.5 04/22/2017 1145   MONOABS 0.5 06/23/2017 1221   EOSABS 0.1 06/23/2017 1221   EOSABS 0.1 04/22/2017 1145   BASOSABS 0.0 06/23/2017 1221   BASOSABS 0.0 04/22/2017 1145   Iron/TIBC/Ferritin/ %Sat No results found for: IRON, TIBC, FERRITIN, IRONPCTSAT Lipid Panel     Component Value Date/Time   CHOL 130 04/22/2017 1145   TRIG 141 04/22/2017 1145   HDL 25 (L) 04/22/2017 1145   CHOLHDL 6 05/26/2016 1134   VLDL 26.0 05/26/2016 1134   LDLCALC 77 04/22/2017 1145   Hepatic Function Panel     Component Value Date/Time   PROT 7.3 06/23/2017 1221   PROT 7.5 04/22/2017 1145   ALBUMIN 3.6 06/23/2017 1221   ALBUMIN 4.3 04/22/2017 1145   AST 17 06/23/2017 1221   ALT 18 06/23/2017 1221   ALKPHOS 85 06/23/2017 1221   BILITOT 0.6 06/23/2017 1221   BILITOT 0.4 04/22/2017 1145   BILIDIR 0.1 02/28/2016 1414   TSH 1.040  04/22/2017 1145 TSH  1.70   02/13/2014  1145 TSH  1.795  12/27/2007  1447  ASSESSMENT AND PLAN: Type 2 diabetes mellitus without complication, without long-term current use of insulin ([place]) - Plan: Ambulatory referral to Family Practice  Class 3 obesity with serious comorbidity and body mass index (BMI) of 40.0 to 44.9 in adult, unspecified obesity type ([place])  PLAN:  Diabetes II Lisa [name] has been given extensive  diabetes education by myself today including ideal fasting and post-prandial blood glucose readings, individual ideal Hgb A1c goals  and hypoglycemia prevention. We discussed the importance of good blood sugar control to decrease the likelihood of diabetic complications such as nephropathy, neuropathy, limb loss, blindness, coronary artery disease, and death. We discussed the importance of intensive lifestyle modification including diet, exercise and weight loss as the first line treatment for diabetes. Lisa [name] agrees to continue her diabetes medications and will follow up at the agreed upon time.  We spent > than 50% of the 15 minute visit on the counseling as documented in the note.  Obesity Lisa [name] is currently in the action stage of change. As such, her goal is to continue with weight loss efforts She has agreed to follow the Category 3 plan Lisa [name] has been instructed to work up to a goal of 150 minutes of combined cardio and strengthening exercise per week for weight loss and overall health benefits. We discussed the following Behavioral Modification Strategies today: increasing lean protein intake and work on meal planning and easy cooking plans  Lisa [name] has agreed to follow up with our clinic in 2 weeks. She was informed of the importance of frequent follow up visits to maximize her success with intensive lifestyle modifications for her multiple health conditions.  Lisa [name], Lisa [name], am acting as transcriptionist for Lisa Duverney, PA-C  Lisa [name] have reviewed the above documentation for accuracy and completeness, and Lisa [name] agree with the above. -Lisa Duverney, PA-C  Lisa [name] have reviewed the above note and agree with the plan. -Lisa Nip, MD  OBESITY BEHAVIORAL INTERVENTION VISIT  Today's visit was # 7 out of 22.  Starting weight: 271 lbs Starting date: 04/22/17 Today's weight : 257 lbs  Today's date: 08/19/2017 Total lbs lost to date: 14 (Patients must lose 7 lbs in the first 6 months to continue with  counseling)   ASK: We discussed the diagnosis of obesity with Lisa [name] today and Lisa [name] agreed to give [name] permission to discuss obesity behavioral modification therapy today.  ASSESS: Lisa [name] has the diagnosis of obesity and her BMI today is 42.77 Lisa [name] is in the action stage of change   ADVISE: Lisa [name] was educated on the multiple health risks of obesity as well as the benefit of weight loss to improve her health. She was advised of the need for long term treatment and the importance of lifestyle modifications.  AGREE: Multiple dietary modification options and treatment options were discussed and  Lisa [name] agreed to follow the Category 3 plan We discussed the following Behavioral Modification Strategies today: increasing lean protein intake and work on meal planning and easy cooking plans

## 2017-08-25 ENCOUNTER — Telehealth: Payer: Self-pay | Admitting: Family Medicine

## 2017-08-25 NOTE — Telephone Encounter (Signed)
Called patient to schedule new patient appointment. Left VM.

## 2017-08-30 ENCOUNTER — Telehealth (INDEPENDENT_AMBULATORY_CARE_PROVIDER_SITE_OTHER): Payer: Self-pay | Admitting: Family Medicine

## 2017-08-30 NOTE — Telephone Encounter (Signed)
ONE TOUCH LANCETS MISC  [facility]  [address] needs new script for 3 month supply

## 2017-08-31 ENCOUNTER — Other Ambulatory Visit (INDEPENDENT_AMBULATORY_CARE_PROVIDER_SITE_OTHER): Payer: Self-pay

## 2017-08-31 DIAGNOSIS — E119 Type 2 diabetes mellitus without complications: Secondary | ICD-10-CM

## 2017-08-31 MED ORDER — ONETOUCH LANCETS MISC: 0 refills | Status: DC

## 2017-08-31 NOTE — Telephone Encounter (Signed)
Prescription was sent to the pharmacy as requested. Lisa [name], [place]

## 2017-09-02 ENCOUNTER — Telehealth (INDEPENDENT_AMBULATORY_CARE_PROVIDER_SITE_OTHER): Payer: Self-pay | Admitting: Family Medicine

## 2017-09-02 ENCOUNTER — Ambulatory Visit (INDEPENDENT_AMBULATORY_CARE_PROVIDER_SITE_OTHER): Payer: Managed Care, Other (non HMO) | Admitting: Physician Assistant

## 2017-09-02 VITALS — BP 106/68 | HR 63 | Temp 98.6°F | Ht 65.0 in | Wt 262.0 lb

## 2017-09-02 DIAGNOSIS — E119 Type 2 diabetes mellitus without complications: Secondary | ICD-10-CM | POA: Diagnosis not present

## 2017-09-02 DIAGNOSIS — Z6841 Body Mass Index (BMI) 40.0 and over, adult: Secondary | ICD-10-CM | POA: Diagnosis not present

## 2017-09-02 MED ORDER — GLUCOSE BLOOD VI STRP: 1.0000 | ORAL_STRIP | 0 refills | Status: DC

## 2017-09-02 NOTE — Telephone Encounter (Signed)
Prescription sent over to the pharmacy. Lisa [name], [place]

## 2017-09-02 NOTE — Telephone Encounter (Signed)
Test strips glucose  Pt needs 90 day supply for insurance to pay [facility] on [address]   Urgent pt has only 2 strips

## 2017-09-06 ENCOUNTER — Encounter: Payer: Self-pay | Admitting: Family Medicine

## 2017-09-06 ENCOUNTER — Ambulatory Visit (INDEPENDENT_AMBULATORY_CARE_PROVIDER_SITE_OTHER): Payer: Managed Care, Other (non HMO) | Admitting: Family Medicine

## 2017-09-06 VITALS — BP 114/70 | HR 80 | Temp 98.7°F | Ht 65.0 in | Wt 264.2 lb

## 2017-09-06 DIAGNOSIS — E1159 Type 2 diabetes mellitus with other circulatory complications: Secondary | ICD-10-CM

## 2017-09-06 DIAGNOSIS — F17200 Nicotine dependence, unspecified, uncomplicated: Secondary | ICD-10-CM

## 2017-09-06 DIAGNOSIS — I10 Essential (primary) hypertension: Secondary | ICD-10-CM

## 2017-09-06 DIAGNOSIS — I152 Hypertension secondary to endocrine disorders: Secondary | ICD-10-CM

## 2017-09-06 DIAGNOSIS — E785 Hyperlipidemia, unspecified: Secondary | ICD-10-CM

## 2017-09-06 DIAGNOSIS — M79644 Pain in right finger(s): Secondary | ICD-10-CM

## 2017-09-06 DIAGNOSIS — E1121 Type 2 diabetes mellitus with diabetic nephropathy: Secondary | ICD-10-CM

## 2017-09-06 DIAGNOSIS — Z23 Encounter for immunization: Secondary | ICD-10-CM | POA: Diagnosis not present

## 2017-09-06 DIAGNOSIS — E1169 Type 2 diabetes mellitus with other specified complication: Secondary | ICD-10-CM

## 2017-09-06 NOTE — Assessment & Plan Note (Signed)
On wellbutrin 150mg  BID- through Dr. Lamonte Sakai Has tried e cigarettes, smoking cessation classes, lozenges. Never on patch. Gum didn't help.  States only smokes when she is bored- 1 to 1.5 packs per day- states out of boredom.   Advised 21mg  patch of nicotine. In addition may also use up to 8 pieces of gum per day (2 mg). Advise 2 week recheck.

## 2017-09-06 NOTE — Assessment & Plan Note (Addendum)
S: controlled on  cardura 2mg , losartan 100mg , spironolactone 50mg , coreg 25mg  BID.  BP Readings from Last 3 Encounters:  09/06/17 114/70  09/02/17 106/68  08/19/17 120/75  A/P: We discussed blood pressure goal of <140/90. Continue current meds. She is interested in reducing meds- could consider going down on spironolactone to 25mg . [facility] wanted to consider lasix - consider in future

## 2017-09-06 NOTE — Progress Notes (Signed)
Phone: [phone]  Subjective:  Patient presents today to establish care with me as their new primary care provider. Patient was formerly a patient of Terri Piedra. Chief complaint-noted.   See problem oriented charting ROS- intermittent edema- none at present. At times with anxiety can get some chest tightness- nothing exertional. No worsening shortness of breath- rare  The following were reviewed and entered/updated in epic: Past Medical History:  Diagnosis Date  . Allergic rhinitis   . Asthma   . Breast nodule    right breast, to see dr Helane Rima 06-20-2012 for   . Chest pain, non-cardiac   . Chronic headaches   . Cigarette smoker   . Complication of anesthesia 05-17-2012   trouble breathing after colonscopy, needed nebulizer  . Diverticulosis   . GERD (gastroesophageal reflux disease)   . Hepatic hemangioma   . Hypertension   . IBS (irritable bowel syndrome)   . Obesity   . Prosthetic eye globe   . Pyloric erosion   . Sleep apnea    CPAP setting varies from 4-10  . Type 2 diabetes mellitus ([place]) 03/11/2017   Patient Active Problem List   Diagnosis Date Noted  . Type 2 diabetes mellitus ([place]) 03/11/2017    Priority: High  . PAD (peripheral artery disease) ([place]) 01/19/2017    Priority: High  . 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 Morbid obesity ([place]) 02/14/2016    Priority: High  . Tobacco use disorder 08/12/2015    Priority: High  . COPD (chronic obstructive pulmonary disease) ([place]) 01/18/2015    Priority: High  . Breast mass 08/03/2017    Priority: Medium  . GERD (gastroesophageal reflux disease) 02/14/2016    Priority: Medium  . DOE (dyspnea on exertion) 03/08/2014    Priority: Medium  . Migraine without aura 07/26/2013    Priority: Medium  . Sleep apnea 02/11/2012    Priority: Medium  . Chest discomfort 10/12/2011    Priority: Medium  . Hypertension associated with diabetes ([place]) 10/12/2011    Priority: Medium  . Chronic hip pain 07/29/2017    Priority: Low  . Left knee pain 05/15/2016    Priority: Low  . Flank pain 04/21/2016    Priority: Low  . Abdominal pain 02/28/2016    Priority: Low  . Pulmonary nodule, right 04/11/2015    Priority: Low  . Mediastinal lymphadenopathy 01/18/2015    Priority: Low  . Hyperlipidemia associated with type 2 diabetes mellitus ([place]) 09/06/2017   Past Surgical History:  Procedure Laterality Date  . ABDOMINAL AORTOGRAM W/LOWER EXTREMITY N/A 04/30/2017   Procedure: Abdominal Aortogram w/Lower Extremity;  Surgeon: Elam Dutch, MD;  Location: [facility];  Service: Cardiovascular;  Laterality: N/A;  . CARDIAC CATHETERIZATION  12/28/2007   EF 75%. IT REVEALS NORMAL/SUPRANORMAL LEFT VENTRICULAR SYSTOLIC FUNCTION  . CARDIOVASCULAR STRESS TEST  03/25/2007   EF 78%  . CESAREAN SECTION  2003, 2009   x 2  . CHOLECYSTECTOMY  1993  . COLONOSCOPY  2014  . ENDOMETRIAL ABLATION  09/2010   and D&C  . HERNIA REPAIR  9179   umbilical  . LAPAROSCOPY    . PERIPHERAL VASCULAR BALLOON ANGIOPLASTY Left 04/30/2017   Procedure: Peripheral Vascular Balloon Angioplasty;  Surgeon: Elam Dutch, MD;  Location: [facility];  Service: Cardiovascular;  Laterality: Left;  SFA  . TUBAL LIGATION    . UMBILICAL HERNIA REPAIR  2004  . US ECHOCARDIOGRAPHY  05/08/2008  EF 55-60%  . VENTRAL HERNIA REPAIR  06/15/2012   Procedure: LAPAROSCOPIC VENTRAL HERNIA;  Surgeon: Edward Jolly, MD;  Location: [facility];  Service: General;  Laterality: N/A;  LAPAROSCOPIC REPAIR VENTRAL HERNIA    Family History  Problem Relation Age of Onset  . Hypertension Mother   . Lung cancer Mother   . Diabetes Maternal Grandmother   . Heart disease Father   . Heart attack Paternal Uncle   . Breast cancer Unknown   . Colon cancer Neg Hx     Medications- reviewed and updated Current Outpatient Prescriptions  Medication Sig Dispense Refill  . acetaminophen (TYLENOL)  325 MG tablet Take 650 mg by mouth every 6 (six) hours as needed (for pain).    Marland Kitchen acetaminophen-codeine (TYLENOL #3) 300-30 MG tablet Take 1 tablet by mouth every 4 (four) hours as needed for moderate pain. 60 tablet 0  . albuterol (PROVENTIL HFA;VENTOLIN HFA) 108 (90 Base) MCG/ACT inhaler Inhale 2 puffs into the lungs every 6 (six) hours as needed for wheezing or shortness of breath. 1 Inhaler 3  . aspirin 325 MG EC tablet Take 1 tablet (325 mg total) by mouth daily. 30 tablet 10  . atorvastatin (LIPITOR) 40 MG tablet Take 1 tablet (40 mg total) by mouth daily. 90 tablet 3  . Blood Glucose Monitoring Suppl (ONE TOUCH ULTRA MINI) w/Device KIT Use meter to check blood sugars 1-4 times daily as instructed. 1 each 0  . buPROPion (WELLBUTRIN SR) 150 MG 12 hr tablet Take 150 mg by mouth in the morning  0  . calcium carbonate (TUMS - DOSED IN MG ELEMENTAL CALCIUM) 500 MG chewable tablet Chew 1-3 tablets by mouth daily as needed for indigestion or heartburn.     . carvedilol (COREG) 25 MG tablet TAKE 1 TABLET (25 MG TOTAL) BY MOUTH 2 (TWO) TIMES DAILY. 180 tablet 3  . dicyclomine (BENTYL) 10 MG capsule TAKE 1 CAPSULE (10 MG TOTAL) BY MOUTH 3 (THREE) TIMES DAILY AS NEEDED FOR SPASMS. 90 capsule 3  . doxazosin (CARDURA) 2 MG tablet Take 2 mg by mouth 2 (two) times daily.     . fluticasone (FLONASE) 50 MCG/ACT nasal spray Place 2 sprays into both nostrils daily as needed for allergies or rhinitis.    Marland Kitchen glucose blood (ONE TOUCH ULTRA TEST) test strip 1 each by Other route as directed. Use one strip per test. Test blood sugars 1-4 times daily as instructed. 100 each 0  . liraglutide 18 MG/3ML SOPN Inject 0.2 mLs (1.2 mg total) into the skin every morning. 2 pen 0  . losartan (COZAAR) 100 MG tablet TAKE 1 TABLET (100 MG TOTAL) BY MOUTH DAILY. 90 tablet 1  . Multiple Vitamin (MULTIVITAMIN WITH MINERALS) TABS tablet Take 1 tablet by mouth daily.    Marland Kitchen omeprazole (PRILOSEC) 40 MG capsule TAKE 1 CAPSULE (40 MG TOTAL)  BY MOUTH 2 (TWO) TIMES DAILY. (Patient taking differently: Take 40 mg by mouth See admin instructions. ONE TO TWO TIMES A DAY) 180 capsule 0  . ONE TOUCH LANCETS MISC Use to check blood sugars twice a day 200 each 0  . spironolactone (ALDACTONE) 50 MG tablet Take 1 tablet (50 mg total) by mouth daily. 90 tablet 2   No current facility-administered medications for this visit.     Allergies-reviewed and updated Allergies  Allergen Reactions  . Almond Oil Anaphylaxis and Nausea And Vomiting    Almond-Nuts Only  . Apple Anaphylaxis  . Ceftin [Cefuroxime] Other (See Comments)  Severe stomach pain - Diverticulitis flares up  . Cherry Extract Anaphylaxis    Cherry fruit only   . Other Anaphylaxis and Itching    Most fruits- Are NOT tolerated (PLEASE ASK PATIENT BEFORE GIVING, AS CERTAIN FRUITS ARE TOLERATED IN LIMITED QUANTITIES!!)  . Guaifenesin & Derivatives Other (See Comments)    Restlessness, jittery  . Hctz [Hydrochlorothiazide] Other (See Comments)    Causes a headache  . Morphine And Related Other (See Comments)    Moodiness.  Causes anger.  . Naproxen Other (See Comments)    Insomnia  . Lisinopril Cough         Social History   Social History  . Marital status: Married    Spouse name: Science writer  . Number of children: 2  . Years of education: 14+   Occupational History  . 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 Smoking status: Current Every Day Smoker    Packs/day: 1.00    Years: 25.00    Types: Cigarettes    Last attempt to quit: 09/18/2016  . Smokeless tobacco: Never Used  . Alcohol use 0.0 oz/week     Comment: very rare  . Drug use: No  . Sexual activity: Not Currently   Other Topics Concern  . None   Social History Narrative   Patient lives at her home with her family.   Caffeine Use: 1 soda occasionally       Objective: BP 114/70 (BP Location: Left Arm, Patient Position: Sitting, Cuff Size: Large)   Pulse 80   Temp 98.7  F (37.1 C) (Oral)   Ht _0  (1.651 m)   Wt 264 lb 3.2 oz (119.8 kg)   SpO2 94%   BMI 43.97 kg/m  Gen: NAD, resting comfortably HEENT: Mucous membranes are moist. Oropharynx normal Neck: no thyromegaly CV: RRR no murmurs rubs or gallops Lungs: CTAB no crackles, wheeze, rhonchi Abdomen: soft/nontender/nondistended/normal bowel sounds. obese Ext: no edema Skin: warm, dry MSK: right middle finger very tender to palpation from MCP to PIP joint  Assessment/Plan:  Pain of right middle finger - Plan: Ambulatory referral to Sports Medicine S:Patient left handed. Right middle finger pain for several months- severe pain in between MCP and PIP mainly. Severe pain when she flexes finger.   Told years ago that she had carpal tunnel years ago in that hand and was also told cubital tunnel.  A/P: Entire digit slightly swollen- given chronicity do not strongly suspect something like gout- and pain not in joint itself. unclear cause of pain- refer to Dr. Paulla Fore for evaluation. Splint trial- would prefer long aluminum - but only have individual finger  Flu shot given - will be input  Monitor creatiine and GFR- appears GFR is right around 60  Hypertension associated with diabetes ([place]) S: controlled on  cardura 64m, losartan 1022m spironolactone 5062mcoreg 80m64mD.  BP Readings from Last 3 Encounters:  09/06/17 114/70  09/02/17 106/68  08/19/17 120/75  A/P: We discussed blood pressure goal of <140/90. Continue current meds. She is interested in reducing meds- could consider going down on spironolactone to [facility] wanted to consider lasix - consider in future  Tobacco use disorder On wellbutrin 150mg 34m through Dr. Byrum Lamonte Sakairied e cigarettes, smoking cessation classes, lozenges. Never on patch. Gum didn't help.  States only smokes when she is bored- 1 to 1.5 packs per day- states out of boredom.  Advised 21m patch of nicotine. In addition may also use up  to 8 pieces of gum per day (2 mg). Advise 2 week recheck.   Type 2 diabetes mellitus ([place] S: well controlled. On victoza 1.275mas well as weight loss efforts Lab Results  Component Value Date   HGBA1C 6.3 07/30/2017   HGBA1C 7.0 (H) 04/22/2017   HGBA1C 7.7 (H) 03/11/2017   A/P: continue current medications   Hyperlipidemia associated with type 2 diabetes mellitus (HCTrooperS: suspect controlled on atorvastatin 4036mNo myalgias.  Lab Results  Component Value Date   CHOL 130 04/22/2017   HDL 25 (L) 04/22/2017   LDLCALC 77 04/22/2017   TRIG 141 04/22/2017   CHOLHDL 6 05/26/2016   A/P: update LDL today with target LDL under 70   Future Appointments Date Time Provider DepPisgah0/07/2017 11:00 AM MC-CV NL VASC 1 MC-SECVI CHMGNL  09/16/2017 11:30 AM OsmWaldon MerlA-C MWM-MWM None  11/11/2017 11:00 AM MC-CV HS VASC 4 MC-HCVI VVS  11/11/2017 12:00 PM MC-CV HS VASC 4 MC-HCVI VVS  11/11/2017 12:15 PM Nickel, SuzSharmon LeydenP VVS-[facility] VVS  12/15/2017 3:30 PM Penumalli, VikEarlean PolkaD GNA-GNA None  02/14/2018 11:10 AM GI-[facility]G [place]-BCGUS [facility]   Return in about 2 weeks (around 09/20/2017).  Orders Placed This Encounter  Procedures  . Ambulatory referral to Sports Medicine    Referral Priority:   Routine    Referral Type:   Consultation    Referred to Provider:   RigGerda DissO    Number of Visits Requested:   1   Return precautions advised.  SteGarret ReddishD

## 2017-09-06 NOTE — Progress Notes (Signed)
Office: [phone]  /  Fax: [phone]   HPI:   Chief Complaint: OBESITY Lisa [name] is here to discuss her progress with her obesity treatment plan. She is on the  follow the Category 3 plan and is following her eating plan approximately 80 % of the time. She states she is exercising 20 minutes 7 times per week. Laisa is retaining fluids. States she has swelling of the left ankle. She is scheduled to follow up with her PCP in 3 days. She would like more meal planning ideas.  Her weight is 262 lb (118.8 kg) today and has a 5 lb weight gain since her last visit. She has lost 9 lbs since starting treatment with [name].  Diabetes II Kaitlan has a diagnosis of diabetes type II. Amariya states patient does not check sugars and denies any hypoglycemic episodes. Patient has not been taking Victoza the past 2 weeks due to cost but states she will restart the medicine today. She declines any other more affordable diabetes medicine.  however she plans on starting back on the medication tomorrow.  Last A1c was 6.3 on 07/30/17.   She has been working on intensive lifestyle modifications including diet, exercise, and weight loss to help control her blood glucose levels.  ALLERGIES: Allergies  Allergen Reactions  . Almond Oil Anaphylaxis and Nausea And Vomiting    Almond-Nuts Only  . Apple Anaphylaxis  . Ceftin [Cefuroxime] Other (See Comments)    Severe stomach pain - Diverticulitis flares up  . Cherry Extract Anaphylaxis    Cherry fruit only   . Other Anaphylaxis and Itching    Most fruits- Are NOT tolerated (PLEASE ASK PATIENT BEFORE GIVING, AS CERTAIN FRUITS ARE TOLERATED IN LIMITED QUANTITIES!!)  . Guaifenesin & Derivatives Other (See Comments)    Restlessness, jittery  . Hctz [Hydrochlorothiazide] Other (See Comments)    Causes a headache  . Morphine And Related Other (See Comments)    Moodiness.  Causes anger.  . Naproxen Other (See Comments)    Insomnia  . Lisinopril Cough          MEDICATIONS: Current Outpatient Prescriptions on File Prior to Visit  Medication Sig Dispense Refill  . acetaminophen (TYLENOL) 325 MG tablet Take 650 mg by mouth every 6 (six) hours as needed (for pain).    Marland Kitchen acetaminophen-codeine (TYLENOL #3) 300-30 MG tablet Take 1 tablet by mouth every 4 (four) hours as needed for moderate pain. 60 tablet 0  . albuterol (PROVENTIL HFA;VENTOLIN HFA) 108 (90 Base) MCG/ACT inhaler Inhale 2 puffs into the lungs every 6 (six) hours as needed for wheezing or shortness of breath. 1 Inhaler 3  . aspirin 325 MG EC tablet Take 1 tablet (325 mg total) by mouth daily. 30 tablet 10  . atorvastatin (LIPITOR) 40 MG tablet Take 1 tablet (40 mg total) by mouth daily. 90 tablet 3  . Blood Glucose Monitoring Suppl (ONE TOUCH ULTRA MINI) w/Device KIT Use meter to check blood sugars 1-4 times daily as instructed. 1 each 0  . buPROPion (WELLBUTRIN SR) 150 MG 12 hr tablet Take 150 mg by mouth in the morning  0  . calcium carbonate (TUMS - DOSED IN MG ELEMENTAL CALCIUM) 500 MG chewable tablet Chew 1-3 tablets by mouth daily as needed for indigestion or heartburn.     . carvedilol (COREG) 25 MG tablet TAKE 1 TABLET (25 MG TOTAL) BY MOUTH 2 (TWO) TIMES DAILY. 180 tablet 3  . dicyclomine (BENTYL) 10 MG capsule TAKE 1 CAPSULE (10 MG  TOTAL) BY MOUTH 3 (THREE) TIMES DAILY AS NEEDED FOR SPASMS. 90 capsule 3  . doxazosin (CARDURA) 2 MG tablet Take 2 mg by mouth 2 (two) times daily.     . fluticasone (FLONASE) 50 MCG/ACT nasal spray Place 2 sprays into both nostrils daily as needed for allergies or rhinitis.    Marland Kitchen liraglutide 18 MG/3ML SOPN Inject 0.2 mLs (1.2 mg total) into the skin every morning. 2 pen 0  . losartan (COZAAR) 100 MG tablet TAKE 1 TABLET (100 MG TOTAL) BY MOUTH DAILY. 90 tablet 1  . Multiple Vitamin (MULTIVITAMIN WITH MINERALS) TABS tablet Take 1 tablet by mouth daily.    Marland Kitchen omeprazole (PRILOSEC) 40 MG capsule TAKE 1 CAPSULE (40 MG TOTAL) BY MOUTH 2 (TWO) TIMES DAILY.  (Patient taking differently: Take 40 mg by mouth See admin instructions. ONE TO TWO TIMES A DAY) 180 capsule 0  . ONE TOUCH LANCETS MISC Use to check blood sugars twice a day 200 each 0  . spironolactone (ALDACTONE) 50 MG tablet Take 1 tablet (50 mg total) by mouth daily. 90 tablet 2   No current facility-administered medications on file prior to visit.     PAST MEDICAL HISTORY: Past Medical History:  Diagnosis Date  . Allergic rhinitis   . Asthma   . Breast nodule    right breast, to see dr Helane Rima 06-20-2012 for   . Chest pain, non-cardiac   . Chronic headaches   . Cigarette smoker   . Complication of anesthesia 05-17-2012   trouble breathing after colonscopy, needed nebulizer  . Diverticulosis   . GERD (gastroesophageal reflux disease)   . Hepatic hemangioma   . Hypertension   . IBS (irritable bowel syndrome)   . Obesity   . Prosthetic eye globe   . Pyloric erosion   . Sleep apnea    CPAP setting varies from 4-10  . Type 2 diabetes mellitus ([place]) 03/11/2017    PAST SURGICAL HISTORY: Past Surgical History:  Procedure Laterality Date  . ABDOMINAL AORTOGRAM W/LOWER EXTREMITY N/A 04/30/2017   Procedure: Abdominal Aortogram w/Lower Extremity;  Surgeon: Elam Dutch, MD;  Location: [facility];  Service: Cardiovascular;  Laterality: N/A;  . CARDIAC CATHETERIZATION  12/28/2007   EF 75%. IT REVEALS NORMAL/SUPRANORMAL LEFT VENTRICULAR SYSTOLIC FUNCTION  . CARDIOVASCULAR STRESS TEST  03/25/2007   EF 78%  . CESAREAN SECTION  2003, 2009   x 2  . CHOLECYSTECTOMY  1993  . COLONOSCOPY  2014  . ENDOMETRIAL ABLATION  09/2010   and D&C  . HERNIA REPAIR  2671   umbilical  . LAPAROSCOPY    . PERIPHERAL VASCULAR BALLOON ANGIOPLASTY Left 04/30/2017   Procedure: Peripheral Vascular Balloon Angioplasty;  Surgeon: Elam Dutch, MD;  Location: [facility];  Service: Cardiovascular;  Laterality: Left;  SFA  . TUBAL LIGATION    . UMBILICAL HERNIA REPAIR  2004  . US  ECHOCARDIOGRAPHY  05/08/2008   EF 55-60%  . VENTRAL HERNIA REPAIR  06/15/2012   Procedure: LAPAROSCOPIC VENTRAL HERNIA;  Surgeon: Edward Jolly, MD;  Location: [facility];  Service: General;  Laterality: N/A;  LAPAROSCOPIC REPAIR VENTRAL HERNIA    SOCIAL HISTORY: Social History  Substance Use Topics  . Smoking status: Current Every Day Smoker    Packs/day: 1.00    Years: 25.00    Types: Cigarettes    Last attempt to quit: 09/18/2016  . Smokeless tobacco: Never Used  . Alcohol use 0.0 oz/week     Comment:  very rare    FAMILY HISTORY: Family History  Problem Relation Age of Onset  . Hypertension Mother   . Lung cancer Mother   . Diabetes Maternal Grandmother   . Heart disease Father   . Heart attack Paternal Uncle   . Breast cancer Unknown   . Colon cancer Neg Hx     ROS: Review of Systems  Constitutional: Negative for weight loss.  Endo/Heme/Allergies:       Negative hypoglycemia    PHYSICAL EXAM: Blood pressure 106/68, pulse 63, temperature 98.6 F (37 C), temperature source Oral, height 5' 5" (1.651 m), weight 262 lb (118.8 kg), SpO2 96 %. Body mass index is 43.6 kg/m. Physical Exam  Constitutional: She is oriented to person, place, and time. She appears well-developed and well-nourished.  HENT:  Head: Normocephalic.  Cardiovascular: Normal rate.   Pulmonary/Chest: Effort normal.  Neurological: She is alert and oriented to person, place, and time.  Psychiatric: She has a normal mood and affect.  Vitals reviewed.   RECENT LABS AND TESTS: BMET    Component Value Date/Time   NA 143 06/23/2017 1247   NA 138 04/22/2017 1145   K 3.6 06/23/2017 1247   CL 106 06/23/2017 1247   CO2 24 06/23/2017 1221   GLUCOSE 132 (H) 06/23/2017 1247   BUN 27 (H) 06/23/2017 1247   BUN 19 04/22/2017 1145   CREATININE 1.20 (H) 06/23/2017 1247   CREATININE 1.08 (H) 11/12/2016 1040   CALCIUM 9.5 06/23/2017 1221   GFRNONAA 52 (L) 06/23/2017 1221   GFRAA 60 (L) 06/23/2017 1221    Lab Results  Component Value Date   HGBA1C 6.3 07/30/2017   HGBA1C 7.0 (H) 04/22/2017   HGBA1C 7.7 (H) 03/11/2017   HGBA1C 6.2 02/13/2014   Lab Results  Component Value Date   INSULIN 19.6 04/22/2017   CBC    Component Value Date/Time   WBC 7.7 06/23/2017 1221   RBC 4.78 06/23/2017 1221   HGB 14.6 06/23/2017 1247   HGB 14.3 04/22/2017 1145   HCT 43.0 06/23/2017 1247   HCT 42.3 04/22/2017 1145   PLT 228 06/23/2017 1221   MCV 88.1 06/23/2017 1221   MCV 90 04/22/2017 1145   MCH 30.1 06/23/2017 1221   MCHC 34.2 06/23/2017 1221   RDW 13.6 06/23/2017 1221   RDW 13.9 04/22/2017 1145   LYMPHSABS 2.4 06/23/2017 1221   LYMPHSABS 2.5 04/22/2017 1145   MONOABS 0.5 06/23/2017 1221   EOSABS 0.1 06/23/2017 1221   EOSABS 0.1 04/22/2017 1145   BASOSABS 0.0 06/23/2017 1221   BASOSABS 0.0 04/22/2017 1145   Iron/TIBC/Ferritin/ %Sat No results found for: IRON, TIBC, FERRITIN, IRONPCTSAT Lipid Panel     Component Value Date/Time   CHOL 130 04/22/2017 1145   TRIG 141 04/22/2017 1145   HDL 25 (L) 04/22/2017 1145   CHOLHDL 6 05/26/2016 1134   VLDL 26.0 05/26/2016 1134   LDLCALC 77 04/22/2017 1145   Hepatic Function Panel     Component Value Date/Time   PROT 7.3 06/23/2017 1221   PROT 7.5 04/22/2017 1145   ALBUMIN 3.6 06/23/2017 1221   ALBUMIN 4.3 04/22/2017 1145   AST 17 06/23/2017 1221   ALT 18 06/23/2017 1221   ALKPHOS 85 06/23/2017 1221   BILITOT 0.6 06/23/2017 1221   BILITOT 0.4 04/22/2017 1145   BILIDIR 0.1 02/28/2016 1414    TSH   1.040 on 04/22/17 TSH   1.70 on 02/13/17 TSH   1.795 on 12/27/2007   ASSESSMENT AND PLAN: Diabetes  mellitus type II, non insulin dependent (HCC)  Class 3 severe obesity with serious comorbidity and body mass index (BMI) of 40.0 to 44.9 in adult, unspecified obesity type ([place])  Type 2 diabetes mellitus without complication, without long-term current use of insulin ([place]) - Plan: glucose blood (ONE TOUCH ULTRA TEST) test  strip  PLAN:  Diabetes II Sacha has been given extensive diabetes education by myself today including ideal fasting and post-prandial blood glucose readings, individual ideal HgA1c goals  and hypoglycemia prevention. We discussed the importance of good blood sugar control to decrease the likelihood of diabetic complications such as nephropathy, neuropathy, limb loss, blindness, coronary artery disease, and death. We discussed the importance of intensive lifestyle modification including diet, exercise and weight loss as the first line treatment for diabetes. Dejanay agrees to continue her diabetes medications and will follow up at the agreed upon time.  Obesity Lorain is currently in the action stage of change. As such, her goal is to continue with weight loss efforts She has agreed to follow the Category 3 plan Miliana has been instructed to work up to a goal of 150 minutes of combined cardio and strengthening exercise per week for weight loss and overall health benefits. We discussed the following Behavioral Modification Stratagies today: increasing lean protein intake and work on meal planning and easy cooking plans  We spent > than 50% of the 15 minute visit on the counseling as documented in the note.  Melvenia has agreed to follow up with our clinic in 2 weeks. She was informed of the importance of frequent follow up visits to maximize her success with intensive lifestyle modifications for her multiple health conditions.  I, April Moore, am acting as Location manager for [employer], PA-C  I have reviewed the above documentation for accuracy and completeness, and I agree with the above. -Lacy Duverney, PA-C  I have reviewed the above note and agree with the plan. Dennard Nip, MD   Office: [phone]  /  Fax: [phone]  [place]  Today's visit was # 7 out of 22.  Starting weight: 271 Starting date: 04/22/17 Today's weight : Weight: 262 lb (118.8 kg)   Today's date: 09/07/2017 Total lbs lost to date: 9 (Patients must lose 7 lbs in the first 6 months to continue with counseling)   ASK: We discussed the diagnosis of obesity with [name] today and Janazia agreed to give [name] permission to discuss obesity behavioral modification therapy today.  ASSESS: Achsah has the diagnosis of obesity and her BMI today is 78.9 Radonna is in the action stage of change   ADVISE: Clarice was educated on the multiple health risks of obesity as well as the benefit of weight loss to improve her health. She was advised of the need for long term treatment and the importance of lifestyle modifications.  AGREE: Multiple dietary modification options and treatment options were discussed and  Oluwaseun agreed to follow the Category 3 plan We discussed the following Behavioral Modification Stratagies today: increasing lean protein intake and work on meal planning and easy cooking plans

## 2017-09-06 NOTE — Patient Instructions (Addendum)
Stop by check out desk and schedule a convenient time for you for Dr. Paulla Fore to evaluate your right middle finger  Advised 21mg  patch of nicotine. In addition may also use up to 8 pieces of gum per day (2 mg). Continue wellbutrin.   Since you have had your eye exam within the last year, please sign release of information at check out desk.  Please stop by lab before you go

## 2017-09-06 NOTE — Addendum Note (Signed)
Addended by: Lyndle Herrlich on: 09/06/2017 02:23 PM   Modules accepted: Orders

## 2017-09-06 NOTE — Telephone Encounter (Signed)
done

## 2017-09-06 NOTE — Addendum Note (Signed)
Addended by: Kayren Eaves T on: 09/06/2017 02:34 PM   Modules accepted: Orders

## 2017-09-06 NOTE — Assessment & Plan Note (Signed)
S: suspect controlled on atorvastatin 40mg . No myalgias.  Lab Results  Component Value Date   CHOL 130 04/22/2017   HDL 25 (L) 04/22/2017   LDLCALC 77 04/22/2017   TRIG 141 04/22/2017   CHOLHDL 6 05/26/2016   A/P: update LDL today with target LDL under 70

## 2017-09-06 NOTE — Assessment & Plan Note (Signed)
S: well controlled. On victoza 1.2mg  as well as weight loss efforts Lab Results  Component Value Date   HGBA1C 6.3 07/30/2017   HGBA1C 7.0 (H) 04/22/2017   HGBA1C 7.7 (H) 03/11/2017   A/P: continue current medications

## 2017-09-07 ENCOUNTER — Ambulatory Visit (HOSPITAL_COMMUNITY)
Admission: RE | Admit: 2017-09-07 | Discharge: 2017-09-07 | Disposition: A | Discharge status: Home / Self Care | Payer: Managed Care, Other (non HMO) | Source: Ambulatory Visit | Attending: Cardiovascular Disease | Admitting: Cardiovascular Disease

## 2017-09-07 DIAGNOSIS — G459 Transient cerebral ischemic attack, unspecified: Secondary | ICD-10-CM

## 2017-09-08 ENCOUNTER — Ambulatory Visit (INDEPENDENT_AMBULATORY_CARE_PROVIDER_SITE_OTHER): Payer: Managed Care, Other (non HMO)

## 2017-09-08 ENCOUNTER — Ambulatory Visit: Payer: Self-pay

## 2017-09-08 ENCOUNTER — Ambulatory Visit (INDEPENDENT_AMBULATORY_CARE_PROVIDER_SITE_OTHER): Payer: Managed Care, Other (non HMO) | Admitting: Sports Medicine

## 2017-09-08 ENCOUNTER — Encounter: Payer: Self-pay | Admitting: Sports Medicine

## 2017-09-08 VITALS — BP 122/84 | HR 77 | Ht 65.0 in | Wt 262.6 lb

## 2017-09-08 DIAGNOSIS — M79644 Pain in right finger(s): Secondary | ICD-10-CM

## 2017-09-08 NOTE — Patient Instructions (Signed)

## 2017-09-08 NOTE — Progress Notes (Signed)
OFFICE VISIT NOTE Lisa [name]. [name] [phone]  Lisa [name] - 56 y.o. female MRN [id number]  Date of birth: July 13, 1961  Visit Date: 09/08/2017  PCP: Shelva Majestic, MD   Referred by: Shelva Majestic, MD  Orlie Dakin, CMA acting as scribe for Dr. Berline Chough.  SUBJECTIVE:   Chief Complaint  Patient presents with  . New Patient (Initial Visit)    pain in RT 3rd finger   HPI: As below and per problem based documentation when appropriate.  Lisa [name] is a new patient presenting today for evaluation of pain in the RT third phalange, mainly between the MCP and PIP.  Pain has been present x several months.  No known injury or trauma.   The pain is described as stinging and throbbing and is rated as 4/10 at rest but 10+/10 with flexion.  Worsened with flexing the finger, slight swelling has been present.  Improves with rest Therapies tried include : She has been wearing a finger splint with minimal relief. She has tried taking Tylenol #3 with minimal relief.   Other associated symptoms include: Pain radiates into the palm of the hand and occasionally into the other fingers.   No recent xray    Review of Systems  Constitutional: Positive for chills.  Respiratory: Negative.   Cardiovascular: Negative.   Gastrointestinal: Positive for heartburn.       IBS  Genitourinary: Positive for hematuria.       Followed by urology  Musculoskeletal: Positive for joint pain and myalgias.  Neurological: Positive for tingling. Negative for dizziness and headaches.    Otherwise per HPI.  HISTORY & PERTINENT PRIOR DATA:  No specialty comments available. She reports that she has been smoking Cigarettes.  She has a 25.00 pack-year smoking history. She has never used smokeless tobacco.   Recent Labs  03/11/17 1025 04/22/17 1145 07/30/17 1446  HGBA1C 7.7* 7.0* 6.3   Allergies reviewed per EMR Prior to Admission  medications   Medication Sig Start Date End Date Taking? Authorizing Provider  acetaminophen (TYLENOL) 325 MG tablet Take 650 mg by mouth every 6 (six) hours as needed (for pain).   Yes [provider]  acetaminophen-codeine (TYLENOL #3) 300-30 MG tablet Take 1 tablet by mouth every 4 (four) hours as needed for moderate pain. 08/03/17  Yes Veryl Speak, FNP  albuterol (PROVENTIL HFA;VENTOLIN HFA) 108 (90 Base) MCG/ACT inhaler Inhale 2 puffs into the lungs every 6 (six) hours as needed for wheezing or shortness of breath. 07/30/17  Yes Leslye Peer, MD  aspirin 325 MG EC tablet Take 1 tablet (325 mg total) by mouth daily. 04/30/17  Yes Fields, Janetta Hora, MD  atorvastatin (LIPITOR) 40 MG tablet Take 1 tablet (40 mg total) by mouth daily. 01/19/17  Yes Nche, Bonna Gains, NP  Blood Glucose Monitoring Suppl (ONE TOUCH ULTRA MINI) w/Device KIT Use meter to check blood sugars 1-4 times daily as instructed. 03/24/17  Yes Veryl Speak, FNP  buPROPion [facility] SR) 150 MG 12 hr tablet Take 150 mg by mouth in the morning 04/17/17  Yes [provider]  calcium carbonate (TUMS - DOSED IN MG ELEMENTAL CALCIUM) 500 MG chewable tablet Chew 1-3 tablets by mouth daily as needed for indigestion or heartburn.    Yes [provider]  carvedilol (COREG) 25 MG tablet TAKE 1 TABLET (25 MG TOTAL) BY MOUTH 2 (TWO) TIMES DAILY. 06/17/17  Yes Jeanine Luz  D, FNP  dicyclomine (BENTYL) 10 MG capsule TAKE 1 CAPSULE (10 MG TOTAL) BY MOUTH 3 (THREE) TIMES DAILY AS NEEDED FOR SPASMS. 02/04/17  Yes Ladene Artist, MD  doxazosin (CARDURA) 2 MG tablet Take 2 mg by mouth 2 (two) times daily.  12/24/16  Yes [provider]  fluticasone (FLONASE) 50 MCG/ACT nasal spray Place 2 sprays into both nostrils daily as needed for allergies or rhinitis.   Yes [provider]  glucose blood (ONE TOUCH ULTRA TEST) test strip 1 each by Other route as directed. Use one strip per test. Test blood  sugars 1-4 times daily as instructed. 09/02/17  Yes Alene Mires, Sahar M, PA-C  liraglutide 18 MG/3ML SOPN Inject 0.2 mLs (1.2 mg total) into the skin every morning. 08/05/17  Yes Alene Mires, Sahar M, PA-C  losartan (COZAAR) 100 MG tablet TAKE 1 TABLET (100 MG TOTAL) BY MOUTH DAILY. 06/03/17  Yes Golden Circle, FNP  Multiple Vitamin (MULTIVITAMIN WITH MINERALS) TABS tablet Take 1 tablet by mouth daily.   Yes [provider]  omeprazole (PRILOSEC) 40 MG capsule TAKE 1 CAPSULE (40 MG TOTAL) BY MOUTH 2 (TWO) TIMES DAILY. Patient taking differently: Take 40 mg by mouth See admin instructions. ONE TO TWO TIMES A DAY 06/04/17  Yes Ladene Artist, MD  ONE TOUCH LANCETS MISC Use to check blood sugars twice a day 08/31/17  Yes Leafy Ro, Caren D, MD  spironolactone (ALDACTONE) 50 MG tablet Take 1 tablet (50 mg total) by mouth daily. 12/08/16  Yes Nahser, Wonda Cheng, MD   Patient Active Problem List   Diagnosis Date Noted  . Hyperlipidemia associated with type 2 diabetes mellitus ([place]) 09/06/2017  . Breast mass 08/03/2017  . Chronic hip pain 07/29/2017  . Type 2 diabetes mellitus ([place]) 03/11/2017  . PAD (peripheral artery disease) ([place]) 01/19/2017  . 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 Left knee pain 05/15/2016  . Flank pain 04/21/2016  . Abdominal pain 02/28/2016  . GERD (gastroesophageal reflux disease) 02/14/2016  . Morbid obesity ([place]) 02/14/2016  . Tobacco use disorder 08/12/2015  . Pulmonary nodule, right 04/11/2015  . COPD (chronic obstructive pulmonary disease) ([place]) 01/18/2015  . Mediastinal lymphadenopathy 01/18/2015  . DOE (dyspnea on exertion) 03/08/2014  . Migraine without aura 07/26/2013  . Sleep apnea 02/11/2012  . Chest discomfort 10/12/2011  . Hypertension associated with diabetes ([place]) 10/12/2011   Past Medical History:  Diagnosis Date  . Allergic rhinitis   . Asthma   . Breast nodule    right breast, to see dr Helane Rima 06-20-2012 for     . Chest pain, non-cardiac   . Chronic headaches   . Cigarette smoker   . Complication of anesthesia 05-17-2012   trouble breathing after colonscopy, needed nebulizer  . Diverticulosis   . GERD (gastroesophageal reflux disease)   . Hepatic hemangioma   . Hypertension   . IBS (irritable bowel syndrome)   . Obesity   . Prosthetic eye globe   . Pyloric erosion   . Sleep apnea    CPAP setting varies from 4-10  . Type 2 diabetes mellitus ([place]) 03/11/2017   Family History  Problem Relation Age of Onset  . Hypertension Mother   . Lung cancer Mother        76  . Diabetes Maternal Grandmother   . Heart disease Father        pacemaker or defibrillator  . Heart attack Paternal Uncle   . Breast cancer Unknown   .  Colon cancer Neg Hx    Past Surgical History:  Procedure Laterality Date  . ABDOMINAL AORTOGRAM W/LOWER EXTREMITY N/A 04/30/2017   Procedure: Abdominal Aortogram w/Lower Extremity;  Surgeon: Elam Dutch, MD;  Location: [facility];  Service: Cardiovascular;  Laterality: N/A;  . CARDIAC CATHETERIZATION  12/28/2007   EF 75%. IT REVEALS NORMAL/SUPRANORMAL LEFT VENTRICULAR SYSTOLIC FUNCTION  . CARDIOVASCULAR STRESS TEST  03/25/2007   EF 78%  . CESAREAN SECTION  2003, 2009   x 2  . CHOLECYSTECTOMY  1993  . COLONOSCOPY  2014  . ENDOMETRIAL ABLATION  09/2010   and D&C  . HERNIA REPAIR  9449   umbilical  . LAPAROSCOPY    . PERIPHERAL VASCULAR BALLOON ANGIOPLASTY Left 04/30/2017   Procedure: Peripheral Vascular Balloon Angioplasty;  Surgeon: Elam Dutch, MD;  Location: [facility];  Service: Cardiovascular;  Laterality: Left;  SFA  . TUBAL LIGATION    . UMBILICAL HERNIA REPAIR  2004  . US ECHOCARDIOGRAPHY  05/08/2008   EF 55-60%  . VENTRAL HERNIA REPAIR  06/15/2012   Procedure: LAPAROSCOPIC VENTRAL HERNIA;  Surgeon: Edward Jolly, MD;  Location: [facility];  Service: General;  Laterality: N/A;  LAPAROSCOPIC REPAIR VENTRAL HERNIA   Social History    Occupational History  . 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 Smoking status: Current Every Day Smoker    Packs/day: 1.00    Years: 25.00    Types: Cigarettes    Last attempt to quit: 09/18/2016  . Smokeless tobacco: Never Used  . Alcohol use No  . Drug use: No  . Sexual activity: Not Currently    OBJECTIVE:  VS:  HT:'5\' 5"'$  (165.1 cm)   WT:262 lb 9.6 oz (119.1 kg)  BMI:43.7    BP:122/84  HR:77bpm  TEMP: ( )  RESP:97 % EXAM: Findings:  WDWN, NAD, Non-toxic appearing Alert & appropriately interactive Not depressed or anxious appearing No increased work of breathing. Pupils are equal. EOM intact without nystagmus No clubbing or cyanosis of the extremities appreciated No significant rashes/lesions/ulcerations overlying the examined area. Radial pulses 2+/4.  No significant generalized UE edema. Sensation intact to light touch in upper extremities.  Right hand: Overall well aligned.  No significant deformity. She does have swelling of the right third finger and marked pain to palpation over the A1 pulley.  She has limited extension as well as pain with terminal flexion.  Good capillary refill.  No significant overlying skin changes.    RADIOLOGY: Dg Hand Complete Right  Result Date: 09/08/2017 CLINICAL DATA:  Right hand pain EXAM: RIGHT HAND - COMPLETE 3+ VIEW COMPARISON:  None. FINDINGS: There is no evidence of fracture or dislocation. There is no evidence of arthropathy or other focal bone abnormality. Soft tissues are unremarkable. IMPRESSION: No acute osseous injury of the right hand. Electronically Signed   By: Kathreen Devoid   On: 09/08/2017 16:08    ASSESSMENT & PLAN:     ICD-10-CM   1. Pain of finger of right hand M79.644 DG Hand Complete Right    [name] LIMITED JOINT SPACE STRUCTURES UP RIGHT(NO LINKED CHARGES)   ================================================================= No problem-specific Assessment & Plan notes found for this  encounter.  PROCEDURE NOTE -  ULTRASOUND GUIDEDINJECTION: Right 3rd Finger Trigger finger Images were obtained and interpreted by myself, Teresa Coombs, DO  Images have been saved and stored to PACS system. Images obtained on: GE S7 Ultrasound machine  ULTRASOUND FINDINGS: Thickening of the  flexor tendon and hypertrophy of the A1 pulley.  DESCRIPTION OF PROCEDURE:  The patient's clinical condition is marked by substantial pain and/or significant functional disability. Other conservative therapy has not provided relief, is contraindicated, or not appropriate. There is a reasonable likelihood that injection will significantly improve the patient's pain and/or functional impairment. After discussing the risks, benefits and expected outcomes of the injection and all questions were reviewed and answered, the patient wished to undergo the above named procedure. Verbal consent was obtained. The ultrasound was used to identify the target structure and adjacent neurovascular structures. The skin was then prepped in sterile fashion and the target structure was injected under direct visualization using sterile technique as below: PREP: Alcohol, Ethel Chloride, 3 cc 1% lidocaine with 25-gauge needle APPROACH: Direct inplane, 25g 1.5" needle INJECTATE: 0.5cc 1% lidocaine, 0.5cc 0.5% marcaine, 0.5cc '40mg'$  DepoMedrol ASPIRATE: N/A DRESSING: Band-Aid  Post procedural instructions including recommending icing and warning signs for infection were reviewed. This procedure was well tolerated and there were no complications.   IMPRESSION: Succesful US Guided Injection    ================================================================= Patient Instructions  You had an injection today.  Things to be aware of after injection are listed below: . You may experience no significant improvement or even a slight worsening in your symptoms during the first 24 to 48 hours.  After that we expect your symptoms to  improve gradually over the next 2 weeks for the medicine to have its maximal effect.  You should continue to have improvement out to 6 weeks after your injection. . Dr. Paulla Fore recommends icing the site of the injection for 20 minutes  1-2 times the day of your injection . You may shower but no swimming, tub bath or Jacuzzi for 24 hours. . If your bandage falls off this does not need to be replaced.  It is appropriate to remove the bandage after 4 hours. . You may resume light activities as tolerated unless otherwise directed per Dr. Paulla Fore during your visit  POSSIBLE STEROID SIDE EFFECTS:  Side effects from injectable steroids tend to be less than when taken orally however you may experience some of the symptoms listed below.  If experienced these should only last for a short period of time. Change in menstrual flow  Edema (swelling)  Increased appetite Skin flushing (redness)  Skin rash/acne  Thrush (oral) Yeast vaginitis    Increased sweating  Depression Increased blood glucose levels Cramping and leg/calf  Euphoria (feeling happy)  POSSIBLE PROCEDURE SIDE EFFECTS: The side effects of the injection are usually fairly minimal however if you may experience some of the following side effects that are usually self-limited and will is off on their own.  If you are concerned please feel free to call the office with questions:  Increased numbness or tingling  Nausea or vomiting  Swelling or bruising at the injection site   Please call our office if if you experience any of the following symptoms over the next week as these can be signs of infection:   Fever greater than 100.89F  Significant swelling at the injection site  Significant redness or drainage from the injection site  If after 2 weeks you are continuing to have worsening symptoms please call our office to discuss what the next appropriate actions should be including the potential for a return office visit or other diagnostic  testing.    =================================================================  Follow-up: Return in about 4 weeks (around 10/06/2017).   CMA/ATC served as  scribe during this visit. History, Physical, and Plan performed by medical provider. Documentation and orders reviewed and attested to.      Teresa Coombs, [place] [facility] Physician

## 2017-09-09 ENCOUNTER — Telehealth: Payer: Self-pay | Admitting: *Deleted

## 2017-09-09 NOTE — Telephone Encounter (Signed)
Called and spoke with patient about unremarkable findings, no major findings. Continue current plan. Pt verbalized understanding.

## 2017-09-09 NOTE — Telephone Encounter (Signed)
-----   Message from Penni Bombard, MD sent at 09/09/2017 11:57 AM EDT ----- Unremarkable study. No major findings. Please call patient. Continue current plan. -VRP

## 2017-09-10 NOTE — Procedures (Signed)
PROCEDURE NOTE -  ULTRASOUND GUIDEDINJECTION: Right 3rd Finger Trigger finger Images were obtained and interpreted by myself, Teresa Coombs, DO  Images have been saved and stored to PACS system. Images obtained on: GE S7 Ultrasound machine  ULTRASOUND FINDINGS: Thickening of the  flexor tendon and hypertrophy of the A1 pulley.  DESCRIPTION OF PROCEDURE:  The patient's clinical condition is marked by substantial pain and/or significant functional disability. Other conservative therapy has not provided relief, is contraindicated, or not appropriate. There is a reasonable likelihood that injection will significantly improve the patient's pain and/or functional impairment. After discussing the risks, benefits and expected outcomes of the injection and all questions were reviewed and answered, the patient wished to undergo the above named procedure. Verbal consent was obtained. The ultrasound was used to identify the target structure and adjacent neurovascular structures. The skin was then prepped in sterile fashion and the target structure was injected under direct visualization using sterile technique as below: PREP: Alcohol, Ethel Chloride, 3 cc 1% lidocaine with 25-gauge needle APPROACH: Direct inplane, 25g 1.5" needle INJECTATE: 0.5cc 1% lidocaine, 0.5cc 0.5% marcaine, 0.5cc 40mg  DepoMedrol ASPIRATE: N/A DRESSING: Band-Aid  Post procedural instructions including recommending icing and warning signs for infection were reviewed. This procedure was well tolerated and there were no complications.   IMPRESSION: Succesful US Guided Injection

## 2017-09-13 NOTE — Telephone Encounter (Signed)
Error complete

## 2017-09-16 ENCOUNTER — Ambulatory Visit (INDEPENDENT_AMBULATORY_CARE_PROVIDER_SITE_OTHER): Payer: Managed Care, Other (non HMO) | Admitting: Physician Assistant

## 2017-09-16 VITALS — BP 120/76 | HR 77 | Temp 98.4°F | Ht 65.0 in | Wt 257.0 lb

## 2017-09-16 DIAGNOSIS — Z6841 Body Mass Index (BMI) 40.0 and over, adult: Secondary | ICD-10-CM | POA: Diagnosis not present

## 2017-09-16 DIAGNOSIS — Z9189 Other specified personal risk factors, not elsewhere classified: Secondary | ICD-10-CM

## 2017-09-16 DIAGNOSIS — I10 Essential (primary) hypertension: Secondary | ICD-10-CM | POA: Diagnosis not present

## 2017-09-16 DIAGNOSIS — E118 Type 2 diabetes mellitus with unspecified complications: Secondary | ICD-10-CM

## 2017-09-16 DIAGNOSIS — F419 Anxiety disorder, unspecified: Secondary | ICD-10-CM

## 2017-09-16 MED ORDER — GLUCOSE BLOOD VI STRP: 1.0000 | ORAL_STRIP | 3 refills | Status: DC

## 2017-09-16 NOTE — Progress Notes (Signed)
Office: [phone]  /  Fax: [phone]   HPI:   Chief Complaint: OBESITY Lisa [name] is here to discuss her progress with her obesity treatment plan. She is on the Category 3 plan and is following her eating plan approximately 0 % of the time. She states she is exercising 0 minutes 0 times per week. Lisa [name] is tearful in the office, stating she is under financial restraints and her house might go up for foreclosure. She states her husband reassures her that he will have the financial means, however, she is worried about her finances. Due to her circumstances, she has not been able to afford foods from the meal plan.  Her weight is 257 lb (116.6 kg) today and has had a weight loss of 5 pounds over a period of 2 weeks since her last visit. She has lost 14 lbs since starting treatment with [name].  Anxiety Patient is anxious in the office and is tearful, stating due to her financial situation, she might lose her home. Patient denies any SI, HI. She declines starting any anxiety meds.    Hypertension Lisa [name] is a 56 y.o. female with hypertension. Lisa [name] denies chest pain or shortness of breath. She is working weight loss to help control her blood pressure with the goal of decreasing her risk of heart attack and stroke. Lisa [name] blood pressure is stable.  Diabetes II Lisa [name] has a diagnosis of diabetes type II. Lisa [name] states she is not checking BGs at home and denies any hypoglycemic episodes. Last A1c was 6.3 on 07/30/17. She has been working on intensive lifestyle modifications including diet, exercise, and weight loss to help control her blood glucose levels.  At risk for cardiovascular disease Lisa [name] is at a higher than average risk for cardiovascular disease due to obesity, hypertension, and diabetes. She currently denies any chest pain.  ALLERGIES: Allergies  Allergen Reactions  . Almond Oil Anaphylaxis and Nausea And Vomiting    Almond-Nuts Only  . Apple Anaphylaxis  . Ceftin  [Cefuroxime] Other (See Comments)    Severe stomach pain - Diverticulitis flares up  . Cherry Extract Anaphylaxis    Cherry fruit only   . Other Anaphylaxis and Itching    Most fruits- Are NOT tolerated (PLEASE ASK PATIENT BEFORE GIVING, AS CERTAIN FRUITS ARE TOLERATED IN LIMITED QUANTITIES!!)  . Guaifenesin & Derivatives Other (See Comments)    Restlessness, jittery  . Hctz [Hydrochlorothiazide] Other (See Comments)    Causes a headache  . Morphine And Related Other (See Comments)    Moodiness.  Causes anger.  . Naproxen Other (See Comments)    Insomnia  . Lisinopril Cough         MEDICATIONS: Current Outpatient Prescriptions on File Prior to Visit  Medication Sig Dispense Refill  . acetaminophen (TYLENOL) 325 MG tablet Take 650 mg by mouth every 6 (six) hours as needed (for pain).    Marland Kitchen acetaminophen-codeine (TYLENOL #3) 300-30 MG tablet Take 1 tablet by mouth every 4 (four) hours as needed for moderate pain. 60 tablet 0  . albuterol (PROVENTIL HFA;VENTOLIN HFA) 108 (90 Base) MCG/ACT inhaler Inhale 2 puffs into the lungs every 6 (six) hours as needed for wheezing or shortness of breath. 1 Inhaler 3  . aspirin 325 MG EC tablet Take 1 tablet (325 mg total) by mouth daily. 30 tablet 10  . atorvastatin (LIPITOR) 40 MG tablet Take 1 tablet (40 mg total) by mouth daily. 90 tablet 3  . Blood Glucose Monitoring Suppl (ONE  TOUCH ULTRA MINI) w/Device KIT Use meter to check blood sugars 1-4 times daily as instructed. 1 each 0  . buPROPion (WELLBUTRIN SR) 150 MG 12 hr tablet Take 150 mg by mouth in the morning  0  . calcium carbonate (TUMS - DOSED IN MG ELEMENTAL CALCIUM) 500 MG chewable tablet Chew 1-3 tablets by mouth daily as needed for indigestion or heartburn.     . carvedilol (COREG) 25 MG tablet TAKE 1 TABLET (25 MG TOTAL) BY MOUTH 2 (TWO) TIMES DAILY. 180 tablet 3  . dicyclomine (BENTYL) 10 MG capsule TAKE 1 CAPSULE (10 MG TOTAL) BY MOUTH 3 (THREE) TIMES DAILY AS NEEDED FOR SPASMS. 90  capsule 3  . doxazosin (CARDURA) 2 MG tablet Take 2 mg by mouth 2 (two) times daily.     . fluticasone (FLONASE) 50 MCG/ACT nasal spray Place 2 sprays into both nostrils daily as needed for allergies or rhinitis.    Marland Kitchen liraglutide 18 MG/3ML SOPN Inject 0.2 mLs (1.2 mg total) into the skin every morning. 2 pen 0  . losartan (COZAAR) 100 MG tablet TAKE 1 TABLET (100 MG TOTAL) BY MOUTH DAILY. 90 tablet 1  . Multiple Vitamin (MULTIVITAMIN WITH MINERALS) TABS tablet Take 1 tablet by mouth daily.    Marland Kitchen omeprazole (PRILOSEC) 40 MG capsule TAKE 1 CAPSULE (40 MG TOTAL) BY MOUTH 2 (TWO) TIMES DAILY. (Patient taking differently: Take 40 mg by mouth See admin instructions. ONE TO TWO TIMES A DAY) 180 capsule 0  . ONE TOUCH LANCETS MISC Use to check blood sugars twice a day 200 each 0  . spironolactone (ALDACTONE) 50 MG tablet Take 1 tablet (50 mg total) by mouth daily. 90 tablet 2   No current facility-administered medications on file prior to visit.     PAST MEDICAL HISTORY: Past Medical History:  Diagnosis Date  . Allergic rhinitis   . Asthma   . Breast nodule    right breast, to see dr Helane Rima 06-20-2012 for   . Chest pain, non-cardiac   . Chronic headaches   . Cigarette smoker   . Complication of anesthesia 05-17-2012   trouble breathing after colonscopy, needed nebulizer  . Diverticulosis   . GERD (gastroesophageal reflux disease)   . Hepatic hemangioma   . Hypertension   . IBS (irritable bowel syndrome)   . Obesity   . Prosthetic eye globe   . Pyloric erosion   . Sleep apnea    CPAP setting varies from 4-10  . Type 2 diabetes mellitus ([place]) 03/11/2017    PAST SURGICAL HISTORY: Past Surgical History:  Procedure Laterality Date  . ABDOMINAL AORTOGRAM W/LOWER EXTREMITY N/A 04/30/2017   Procedure: Abdominal Aortogram w/Lower Extremity;  Surgeon: Elam Dutch, MD;  Location: [facility];  Service: Cardiovascular;  Laterality: N/A;  . CARDIAC CATHETERIZATION  12/28/2007   EF 75%.  IT REVEALS NORMAL/SUPRANORMAL LEFT VENTRICULAR SYSTOLIC FUNCTION  . CARDIOVASCULAR STRESS TEST  03/25/2007   EF 78%  . CESAREAN SECTION  2003, 2009   x 2  . CHOLECYSTECTOMY  1993  . COLONOSCOPY  2014  . ENDOMETRIAL ABLATION  09/2010   and D&C  . HERNIA REPAIR  6160   umbilical  . LAPAROSCOPY    . PERIPHERAL VASCULAR BALLOON ANGIOPLASTY Left 04/30/2017   Procedure: Peripheral Vascular Balloon Angioplasty;  Surgeon: Elam Dutch, MD;  Location: [facility];  Service: Cardiovascular;  Laterality: Left;  SFA  . TUBAL LIGATION    . UMBILICAL HERNIA REPAIR  2004  .  US ECHOCARDIOGRAPHY  05/08/2008   EF 55-60%  . VENTRAL HERNIA REPAIR  06/15/2012   Procedure: LAPAROSCOPIC VENTRAL HERNIA;  Surgeon: Edward Jolly, MD;  Location: [facility];  Service: General;  Laterality: N/A;  LAPAROSCOPIC REPAIR VENTRAL HERNIA    SOCIAL HISTORY: Social History  Substance Use Topics  . Smoking status: Current Every Day Smoker    Packs/day: 1.00    Years: 25.00    Types: Cigarettes    Last attempt to quit: 09/18/2016  . Smokeless tobacco: Never Used  . Alcohol use No    FAMILY HISTORY: Family History  Problem Relation Age of Onset  . Hypertension Mother   . Lung cancer Mother        30  . Diabetes Maternal Grandmother   . Heart disease Father        pacemaker or defibrillator  . Heart attack Paternal Uncle   . Breast cancer Unknown   . Colon cancer Neg Hx     ROS: Review of Systems  Constitutional: Positive for weight loss.  Respiratory: Negative for shortness of breath.   Cardiovascular: Negative for chest pain.  Endo/Heme/Allergies:       Negative hypoglycemia    PHYSICAL EXAM: Blood pressure 120/76, pulse 77, temperature 98.4 F (36.9 C), temperature source Oral, height '5\' 5"'$  (1.651 m), weight 257 lb (116.6 kg), SpO2 97 %. Body mass index is 42.77 kg/m. Physical Exam  Constitutional: She is oriented to person, place, and time. She appears well-developed and  well-nourished.  Cardiovascular: Normal rate.   Pulmonary/Chest: Effort normal.  Musculoskeletal: Normal range of motion.  Neurological: She is oriented to person, place, and time.  Skin: Skin is warm and dry.  Psychiatric: She has a normal mood and affect. Her behavior is normal.  Vitals reviewed.   RECENT LABS AND TESTS: BMET    Component Value Date/Time   NA 143 06/23/2017 1247   NA 138 04/22/2017 1145   K 3.6 06/23/2017 1247   CL 106 06/23/2017 1247   CO2 24 06/23/2017 1221   GLUCOSE 132 (H) 06/23/2017 1247   BUN 27 (H) 06/23/2017 1247   BUN 19 04/22/2017 1145   CREATININE 1.20 (H) 06/23/2017 1247   CREATININE 1.08 (H) 11/12/2016 1040   CALCIUM 9.5 06/23/2017 1221   GFRNONAA 52 (L) 06/23/2017 1221   GFRAA 60 (L) 06/23/2017 1221   Lab Results  Component Value Date   HGBA1C 6.3 07/30/2017   HGBA1C 7.0 (H) 04/22/2017   HGBA1C 7.7 (H) 03/11/2017   HGBA1C 6.2 02/13/2014   Lab Results  Component Value Date   INSULIN 19.6 04/22/2017   CBC    Component Value Date/Time   WBC 7.7 06/23/2017 1221   RBC 4.78 06/23/2017 1221   HGB 14.6 06/23/2017 1247   HGB 14.3 04/22/2017 1145   HCT 43.0 06/23/2017 1247   HCT 42.3 04/22/2017 1145   PLT 228 06/23/2017 1221   MCV 88.1 06/23/2017 1221   MCV 90 04/22/2017 1145   MCH 30.1 06/23/2017 1221   MCHC 34.2 06/23/2017 1221   RDW 13.6 06/23/2017 1221   RDW 13.9 04/22/2017 1145   LYMPHSABS 2.4 06/23/2017 1221   LYMPHSABS 2.5 04/22/2017 1145   MONOABS 0.5 06/23/2017 1221   EOSABS 0.1 06/23/2017 1221   EOSABS 0.1 04/22/2017 1145   BASOSABS 0.0 06/23/2017 1221   BASOSABS 0.0 04/22/2017 1145   Iron/TIBC/Ferritin/ %Sat No results found for: IRON, TIBC, FERRITIN, IRONPCTSAT Lipid Panel     Component Value Date/Time   CHOL  130 04/22/2017 1145   TRIG 141 04/22/2017 1145   HDL 25 (L) 04/22/2017 1145   CHOLHDL 6 05/26/2016 1134   VLDL 26.0 05/26/2016 1134   LDLCALC 77 04/22/2017 1145   Hepatic Function Panel     Component  Value Date/Time   PROT 7.3 06/23/2017 1221   PROT 7.5 04/22/2017 1145   ALBUMIN 3.6 06/23/2017 1221   ALBUMIN 4.3 04/22/2017 1145   AST 17 06/23/2017 1221   ALT 18 06/23/2017 1221   ALKPHOS 85 06/23/2017 1221   BILITOT 0.6 06/23/2017 1221   BILITOT 0.4 04/22/2017 1145   BILIDIR 0.1 02/28/2016 1414     ASSESSMENT AND PLAN: Essential hypertension  Controlled type 2 diabetes mellitus with complication, without long-term current use of insulin ([place]) - Plan: glucose blood (ONE TOUCH ULTRA TEST) test strip  At risk for heart disease  Anxiety  Class 3 severe obesity with serious comorbidity and body mass index (BMI) of 40.0 to 44.9 in adult, unspecified obesity type ([place])  PLAN:  Anxiety She is offered medicine but declines any initiation of medicine today.  Patient is encouraged to reach out to [name] if her anxiety is worse or if she would like to consider starting any medicine.  She is advised to go to the ER if anxiety worsens, or if she develops any suicial/homicial ideations.   Hypertension We discussed sodium restriction, working on healthy weight loss, and a regular exercise program as the means to achieve improved blood pressure control. Lisa [name] agreed with this plan and agreed to follow up as directed. We will continue to monitor her blood pressure as well as her progress with the above lifestyle modifications. She agrees to continue her medications as prescribed and will watch for signs of hypotension as she continues her lifestyle modifications. Lisa [name] agrees to follow up with our clinic in 2 weeks.  Diabetes II Lisa [name] has been given extensive diabetes education by myself today including ideal fasting and post-prandial blood glucose readings, individual ideal HgA1c goals  and hypoglycemia prevention. We discussed the importance of good blood sugar control to decrease the likelihood of diabetic complications such as nephropathy, neuropathy, limb loss, blindness, coronary artery disease,  and death. We discussed the importance of intensive lifestyle modification including diet, exercise and weight loss as the first line treatment for diabetes. Lisa [name] agrees to continue her diabetes medications as prescribed and we will refill test strips for 90 day supply. Lisa [name] agrees to follow up with our clinic in 2 weeks.  Cardiovascular risk counselling Lisa [name] was given extended (15 minutes) coronary artery disease prevention counseling today. She is 56 y.o. female and has risk factors for heart disease including obesity. We discussed intensive lifestyle modifications today with an emphasis on specific weight loss instructions and strategies. Pt was also informed of the importance of increasing exercise and decreasing saturated fats to help prevent heart disease.  Obesity Lisa [name] is currently in the action stage of change. As such, her goal is to continue with weight loss efforts She has agreed to follow the Category 3 plan Lisa [name] has been instructed to work up to a goal of 150 minutes of combined cardio and strengthening exercise per week for weight loss and overall health benefits. We discussed the following Behavioral Modification Strategies today: increasing lean protein intake and work on meal planning and easy cooking plans   Lisa [name] has agreed to follow up with our clinic in 2 weeks. She was informed of the importance of frequent follow up visits to maximize her  success with intensive lifestyle modifications for her multiple health conditions.  I, Trixie Dredge, am acting as transcriptionist for Lisa Duverney, PA-C  I have reviewed the above documentation for accuracy and completeness, and I agree with the above. -Lisa Duverney, PA-C  I have reviewed the above note and agree with the plan. -Dennard Nip, MD     Today's visit was # 9 out of 22.  Starting weight: 271 lbs Starting date: 04/22/17 Today's weight : 257 lbs  Today's date: 09/16/2017 Total lbs lost to date: 14 (Patients must  lose 7 lbs in the first 6 months to continue with counseling)   ASK: We discussed the diagnosis of obesity with Lisa [name] today and Daleigh agreed to give [name] permission to discuss obesity behavioral modification therapy today.  ASSESS: Hellen has the diagnosis of obesity and her BMI today is [address] is in the action stage of change   ADVISE: Percilla was educated on the multiple health risks of obesity as well as the benefit of weight loss to improve her health. She was advised of the need for long term treatment and the importance of lifestyle modifications.  AGREE: Multiple dietary modification options and treatment options were discussed and  Zinia agreed to follow the Category 3 plan We discussed the following Behavioral Modification Strategies today: increasing lean protein intake and work on meal planning and easy cooking plans

## 2017-09-17 ENCOUNTER — Telehealth: Payer: Self-pay | Admitting: Family Medicine

## 2017-09-17 ENCOUNTER — Encounter: Payer: Self-pay | Admitting: Family Medicine

## 2017-09-17 ENCOUNTER — Ambulatory Visit (INDEPENDENT_AMBULATORY_CARE_PROVIDER_SITE_OTHER): Payer: Managed Care, Other (non HMO) | Admitting: Family Medicine

## 2017-09-17 VITALS — BP 118/68 | HR 67 | Ht 65.0 in | Wt 260.0 lb

## 2017-09-17 DIAGNOSIS — R109 Unspecified abdominal pain: Secondary | ICD-10-CM | POA: Diagnosis not present

## 2017-09-17 DIAGNOSIS — M549 Dorsalgia, unspecified: Secondary | ICD-10-CM | POA: Diagnosis not present

## 2017-09-17 LAB — POC URINALSYSI DIPSTICK (AUTOMATED)
BILIRUBIN UA: NEGATIVE
GLUCOSE UA: NEGATIVE
Ketones, UA: NEGATIVE
LEUKOCYTES UA: NEGATIVE
NITRITE UA: NEGATIVE
Protein, UA: NEGATIVE
RBC UA: NEGATIVE
Spec Grav, UA: 1.025 (ref 1.010–1.025)
UROBILINOGEN UA: 0.2 U/dL
pH, UA: 6 (ref 5.0–8.0)

## 2017-09-17 MED ORDER — LINACLOTIDE 145 MCG PO CAPS: 145.0000 ug | ORAL_CAPSULE | Freq: Every day | ORAL | 1 refills | Status: DC

## 2017-09-17 NOTE — Progress Notes (Signed)
    Subjective:  Lisa [name] is a 56 y.o. female who presents today with a chief complaint of abdominal pain.   HPI:  Abdominal Pain, Acute issue Symptoms started yesterday around 5 AM.  Pain is mostly located in her upper abdomen and will radiate to her left flank.  She has mild pain at rest.  Pain is worse with movement.  It is described as a spasmodic, sharp pain.  She has a history of constipation.  Her last bowel movement was hard and difficult to pass.  Has a history of diverticulitis, however reports that her current symptoms are not consistent with past episodes of diverticulitis. No nausea or vomiting.  Had a bowel movement yesterday without significant relief of her abdominal pain.  No melena or hematochezia.  She has also tried passing gas and burping without significant improvement.  Patient's gallbladder has been removed.  No obvious precipitating events.  No fevers or chills.  She has a history of microscopic hematuria.  ROS: Per HPI  PMH: Smoking history reviewed.  Current smoker.  Objective:  Physical Exam: BP 118/68   Pulse 67   Ht 5\' 5"  (1.651 m)   Wt 260 lb (117.9 kg)   SpO2 99%   BMI 43.27 kg/m   Gen: NAD, resting comfortably CV: RRR with no murmurs appreciated Pulm: NWOB, CTAB with no crackles, wheezes, or rhonchi GI: Morbidly obese.  Surgical scars present.  No other deformities noted.  Tender to palpation in left upper quadrant.  No rebound or guarding.  Murphy sign negative.  No tenderness at McBurney's point.  No left lower quadrant tenderness.  Normal bowel sounds. MSK: Mild left-sided CVA tenderness.  Results for orders placed or performed in visit on 09/17/17 (from the past 72 hour(s))  POCT Urinalysis Dipstick (Automated)     Status: Normal   Collection Time: 09/17/17  2:19 PM  Result Value Ref Range   Color, UA Yellow    Clarity, UA Clear    Glucose, UA Negative    Bilirubin, UA Negative    Ketones, UA Negative    Spec Grav, UA 1.025 1.010 -  1.025   Blood, UA Negative    pH, UA 6.0 5.0 - 8.0   Protein, UA Negative    Urobilinogen, UA 0.2 0.2 or 1.0 E.U./dL   Nitrite, UA Negative    Leukocytes, UA Negative Negative     Assessment/Plan:  Abdominal pain Broad differential.  Likely secondary to constipation.  She also has underlying IBS which is likely contributing.  UA negative.  Abdominal exam without peritoneal signs.  Patient without AAA, nephrolithiasis, and status post cholecystectomy based on her CT scan last year.  Discussed obtaining CT scan today, however patient deferred.  Treat constipation with Linzess.  Return precautions reviewed.  If symptoms worsening or not improving would have a low threshold to repeat CT scan.  Algis Greenhouse. Jerline Pain, MD 09/17/2017 2:49 PM

## 2017-09-17 NOTE — Telephone Encounter (Signed)
Noted. Appointment scheduled for today 09/17/17 at 1:30.

## 2017-09-17 NOTE — Patient Instructions (Addendum)
Start the [place].  If this is too expensive, please let us know and we can try something else.  You can also try doubling your Bentyl dose.  We may need to repeat your CT scan if your symptoms do not improve within the next few days, or if your symptoms worsen.  Come back if your symptoms are not improving soon.  Take care, Dr. Jerline Pain

## 2017-09-17 NOTE — Telephone Encounter (Signed)
Patient Name: Lisa [name]  DOB: [birth year]    Initial Comment Nurah Petrides states she is having pain from her abdomen around to her back anytime she turns.   Nurse Assessment  Nurse: Cherie Dark RN, Jarrett Soho Date/Time (Eastern Time): 09/17/2017 10:02:04 AM  Confirm and document reason for call. If symptomatic, describe symptoms. ---Caller states she is having some pain in her stomach that wraps around to her back when she turns. She is having some spasms as well. She was turning to get out of the car and felt some pain. There is a twitching on the left side.  Does the patient have any new or worsening symptoms? ---Yes  Will a triage be completed? ---Yes  Related visit to physician within the last 2 weeks? ---No  Does the PT have any chronic conditions? (i.e. diabetes, asthma, etc.) ---Yes  List chronic conditions. ---diabetes, HTN, respiratory interstitial lung disease,  Is the patient pregnant or possibly pregnant? (Ask all females between the ages of 42-55) ---No  Is this a behavioral health or substance abuse call? ---No     Guidelines    Guideline Title Affirmed Question Affirmed Notes  Abdominal Pain - Female [1] MILD-MODERATE pain AND [2] constant AND [3] present > 2 hours    Final Disposition User   See Physician within 4 Hours (or PCP triage) Cherie Dark, RN, Jarrett Soho    Comments  warm transferred patient with Leah at the office to make an appt for today and disconnected call.   Referrals  REFERRED TO PCP OFFICE   Caller Disagree/Comply Comply  Caller Understands Yes  PreDisposition Call Doctor

## 2017-09-17 NOTE — Telephone Encounter (Signed)
ROI faxed to [facility]

## 2017-09-17 NOTE — Telephone Encounter (Signed)
Patient called in asking if she could take tylenol 3 for diaphragm pain. I transferred to team health to advise. Awaiting team health note.

## 2017-09-20 ENCOUNTER — Ambulatory Visit (INDEPENDENT_AMBULATORY_CARE_PROVIDER_SITE_OTHER): Payer: Managed Care, Other (non HMO) | Admitting: Family Medicine

## 2017-09-20 ENCOUNTER — Encounter: Payer: Self-pay | Admitting: Family Medicine

## 2017-09-20 ENCOUNTER — Other Ambulatory Visit: Payer: Managed Care, Other (non HMO)

## 2017-09-20 VITALS — BP 118/80 | HR 81 | Ht 65.0 in | Wt 264.0 lb

## 2017-09-20 DIAGNOSIS — E118 Type 2 diabetes mellitus with unspecified complications: Secondary | ICD-10-CM

## 2017-09-20 DIAGNOSIS — E1121 Type 2 diabetes mellitus with diabetic nephropathy: Secondary | ICD-10-CM | POA: Diagnosis not present

## 2017-09-20 DIAGNOSIS — R109 Unspecified abdominal pain: Secondary | ICD-10-CM

## 2017-09-20 LAB — GLUCOSE, POCT (MANUAL RESULT ENTRY): POC Glucose: 89 mg/dl (ref 70–99)

## 2017-09-20 MED ORDER — GLYCERIN (LAXATIVE) 1 G RE SUPP: RECTAL | 1 refills | Status: DC

## 2017-09-20 MED ORDER — SENNA 8.6 MG PO TABS: 1.0000 | ORAL_TABLET | Freq: Every day | ORAL | 0 refills | Status: DC | PRN

## 2017-09-20 MED ORDER — POLYETHYLENE GLYCOL 3350 17 G PO PACK: 17.0000 g | PACK | Freq: Every day | ORAL | 0 refills | Status: DC

## 2017-09-20 MED ORDER — GLUCOSE BLOOD VI STRP: 1.0000 | ORAL_STRIP | 3 refills | Status: DC

## 2017-09-20 NOTE — Progress Notes (Signed)
    Subjective:  Lisa [name] is a 56 y.o. female who presents today with a chief complaint of abdominal pain follow-up.   HPI:  Abdominal Pain, follow-up visit, worsening Patient seen last week for abdominal pain and was diagnosed with constipation.  Patient was prescribed Linzess at that time, however she has not started this medication.  Reports that her pharmacy told her that she was not able to fill this medication.  Since her last visit her symptoms are slightly worse.  She has had more nausea without any episodes of vomiting.  She has not had a bowel movement in approximately 1 week.  No fevers or chills.  Normal appetite.  She has been passing gas.  She is not currently on any stool softener or laxative.  DIABETES Type II, Chronic, Stable Medications: Victoza 1.2 daily, compliant with this though has some constipation. Interim History-ran out of test strips for her meter and purchased a cheaper glucometer.  Notes that this cheaper glucometer last week read a blood sugar of 98 however her old meter of a reading of 444.  She rechecked her blood sugar and it was 97 on the cheaper meter.  She has been compliant with her medications.  Endorses occasional dietary indiscretion.  ROS: Denies Polyuria,Polydipsia, Denies  Hypoglycemia symptoms (palpitations, tremors, anxiousness)   ROS: Per HPI  PMH: Smoking history reviewed.  Current smoker.  Objective:  Physical Exam: BP 118/80 (BP Location: Left Arm, Patient Position: Sitting, Cuff Size: Normal)   Pulse 81   Ht 5\' 5"  (1.651 m)   Wt 264 lb (119.7 kg)   SpO2 97%   BMI 43.93 kg/m   Gen: NAD, resting comfortably GI: Morbidly obese, hyperactive bowel sounds present.  Nontender.  Nondistended.    Results for orders placed or performed in visit on 09/20/17 (from the past 24 hour(s))  POCT glucose (manual entry)     Status: Normal   Collection Time: 09/20/17  2:02 PM  Result Value Ref Range   POC Glucose 89 70 - 99 mg/dl     Assessment/Plan:  Type 2 diabetes mellitus ([place]) Point of care glucose 89 today.  Continue Victoza 1.2 mg daily.  Send in a new prescription for patient's test strips.  Due for repeat A1c in approximately 6 weeks.  Abdominal pain Likely secondary to constipation with some element of IBS playing a role.  No red flag signs or symptoms today.  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  Start MiraLAX and senna today.  Also gave prescription for glycerin suppository if no bowel movement in 1-2 days.  Consider enema if still having difficulty with bowel movement.  Advised patient that she should be on a stool softener daily going forward.  Return precautions reviewed.  Follow-up as needed.  Algis Greenhouse. Jerline Pain, MD 09/20/2017 2:15 PM

## 2017-09-20 NOTE — Patient Instructions (Signed)
Start the miralax and senna. Use the suppository if no bowel movement after a day.  Come back to see [name] if your pain worsens or you do not have a bowel movement in 2-3 days.  Come back to see Dr Yong Channel in 2-3 months.   Take care,  Dr Jerline Pain

## 2017-09-20 NOTE — Assessment & Plan Note (Signed)
Point of care glucose 89 today.  Continue Victoza 1.2 mg daily.  Send in a new prescription for patient's test strips.  Due for repeat A1c in approximately 6 weeks.

## 2017-09-30 ENCOUNTER — Ambulatory Visit (INDEPENDENT_AMBULATORY_CARE_PROVIDER_SITE_OTHER): Payer: Managed Care, Other (non HMO) | Admitting: Physician Assistant

## 2017-09-30 ENCOUNTER — Encounter: Payer: Self-pay | Admitting: Family Medicine

## 2017-09-30 VITALS — BP 116/70 | HR 69 | Temp 98.3°F | Ht 65.0 in | Wt 257.0 lb

## 2017-09-30 DIAGNOSIS — F419 Anxiety disorder, unspecified: Secondary | ICD-10-CM

## 2017-09-30 DIAGNOSIS — E1121 Type 2 diabetes mellitus with diabetic nephropathy: Secondary | ICD-10-CM | POA: Diagnosis not present

## 2017-09-30 DIAGNOSIS — Z6841 Body Mass Index (BMI) 40.0 and over, adult: Secondary | ICD-10-CM | POA: Diagnosis not present

## 2017-09-30 NOTE — Progress Notes (Signed)
Office: [phone]  /  Fax: [phone]   HPI:   Chief Complaint: OBESITY Lisa [name] is here to discuss her progress with her obesity treatment plan. She is on the  follow the Category 3 plan and is following her eating plan approximately 75 % of the time. She states she is walking 15-30 minutes 6 times per week. Lisa [name] states she has been more stressed and due to financial restrains she has not been able to make better food choices. She also has been snacking more. Her weight is 257 lb (116.6 kg) today and has not lost weight since her last visit. She has lost 14 lbs since starting treatment with [name].  Anxiety Lisa [name] admits to increased stress as she is unemployed and has many financial obligations that she cant meet. States that she had suicidal thoughts, and wanted to "take all the tylenol pills" 2 days ago. States since then, she has talked to her family and today, she feels much better, no longer feels overwhelmed, and no longer has thoughts of self harm or suicide. Today, Lisa [name]'s mood is stable, and has normal thought process and judgement. She denies any suicidal or homicidal ideations.   Diabetes II Lisa [name] has a diagnosis of diabetes type II. [place] states she has not been checking her BS at home. She denies any hypoglycemic episodes. Last A1c was 7.0 on 04/22/17.   She has been working on intensive lifestyle modifications including diet, exercise, and weight loss to help control her blood glucose levels.  ALLERGIES: Allergies  Allergen Reactions  . Almond Oil Anaphylaxis and Nausea And Vomiting    Almond-Nuts Only  . Apple Anaphylaxis  . Ceftin [Cefuroxime] Other (See Comments)    Severe stomach pain - Diverticulitis flares up  . Cherry Extract Anaphylaxis    Cherry fruit only   . Other Anaphylaxis and Itching    Most fruits- Are NOT tolerated (PLEASE ASK PATIENT BEFORE GIVING, AS CERTAIN FRUITS ARE TOLERATED IN LIMITED QUANTITIES!!)  . Guaifenesin & Derivatives Other (See  Comments)    Restlessness, jittery  . Hctz [Hydrochlorothiazide] Other (See Comments)    Causes a headache  . Morphine And Related Other (See Comments)    Moodiness.  Causes anger.  . Naproxen Other (See Comments)    Insomnia  . Lisinopril Cough         MEDICATIONS: Current Outpatient Prescriptions on File Prior to Visit  Medication Sig Dispense Refill  . acetaminophen (TYLENOL) 325 MG tablet Take 650 mg by mouth every 6 (six) hours as needed (for pain).    Marland Kitchen albuterol (PROVENTIL HFA;VENTOLIN HFA) 108 (90 Base) MCG/ACT inhaler Inhale 2 puffs into the lungs every 6 (six) hours as needed for wheezing or shortness of breath. 1 Inhaler 3  . aspirin 325 MG EC tablet Take 1 tablet (325 mg total) by mouth daily. 30 tablet 10  . atorvastatin (LIPITOR) 40 MG tablet Take 1 tablet (40 mg total) by mouth daily. 90 tablet 3  . Blood Glucose Monitoring Suppl (ONE TOUCH ULTRA MINI) w/Device KIT Use meter to check blood sugars 1-4 times daily as instructed. 1 each 0  . buPROPion (WELLBUTRIN SR) 150 MG 12 hr tablet Take 150 mg by mouth in the morning  0  . calcium carbonate (TUMS - DOSED IN MG ELEMENTAL CALCIUM) 500 MG chewable tablet Chew 1-3 tablets by mouth daily as needed for indigestion or heartburn.     . carvedilol (COREG) 25 MG tablet TAKE 1 TABLET (25 MG TOTAL) BY MOUTH 2 (  TWO) TIMES DAILY. 180 tablet 3  . dicyclomine (BENTYL) 10 MG capsule TAKE 1 CAPSULE (10 MG TOTAL) BY MOUTH 3 (THREE) TIMES DAILY AS NEEDED FOR SPASMS. 90 capsule 3  . doxazosin (CARDURA) 2 MG tablet Take 2 mg by mouth 2 (two) times daily.     . fluticasone (FLONASE) 50 MCG/ACT nasal spray Place 2 sprays into both nostrils daily as needed for allergies or rhinitis.    Marland Kitchen glucose blood (ONE TOUCH ULTRA TEST) test strip 1 each by Other route as directed. Use one strip per test. Test blood sugars 1-4 times daily as instructed. 100 each 3  . Glycerin, Laxative, 1 g SUPP Use rectally as needed if no bowel movement after laxatives 12  suppository 1  . liraglutide 18 MG/3ML SOPN Inject 0.2 mLs (1.2 mg total) into the skin every morning. 2 pen 0  . losartan (COZAAR) 100 MG tablet TAKE 1 TABLET (100 MG TOTAL) BY MOUTH DAILY. 90 tablet 1  . Multiple Vitamin (MULTIVITAMIN WITH MINERALS) TABS tablet Take 1 tablet by mouth daily.    Marland Kitchen omeprazole (PRILOSEC) 40 MG capsule TAKE 1 CAPSULE (40 MG TOTAL) BY MOUTH 2 (TWO) TIMES DAILY. (Patient taking differently: Take 40 mg by mouth See admin instructions. ONE TO TWO TIMES A DAY) 180 capsule 0  . ONE TOUCH LANCETS MISC Use to check blood sugars twice a day 200 each 0  . polyethylene glycol (MIRALAX / GLYCOLAX) packet Take 17 g by mouth daily. 14 each 0  . senna (SENOKOT) 8.6 MG TABS tablet Take 1 tablet (8.6 mg total) by mouth daily as needed for mild constipation. 120 each 0  . spironolactone (ALDACTONE) 50 MG tablet Take 1 tablet (50 mg total) by mouth daily. 90 tablet 2   No current facility-administered medications on file prior to visit.     PAST MEDICAL HISTORY: Past Medical History:  Diagnosis Date  . Allergic rhinitis   . Asthma   . Breast nodule    right breast, to see dr Helane Rima 06-20-2012 for   . Chest pain, non-cardiac   . Chronic headaches   . Cigarette smoker   . Complication of anesthesia 05-17-2012   trouble breathing after colonscopy, needed nebulizer  . Diverticulosis   . GERD (gastroesophageal reflux disease)   . Hepatic hemangioma   . Hypertension   . IBS (irritable bowel syndrome)   . Obesity   . Prosthetic eye globe   . Pyloric erosion   . Sleep apnea    CPAP setting varies from 4-10  . Type 2 diabetes mellitus ([place]) 03/11/2017    PAST SURGICAL HISTORY: Past Surgical History:  Procedure Laterality Date  . ABDOMINAL AORTOGRAM W/LOWER EXTREMITY N/A 04/30/2017   Procedure: Abdominal Aortogram w/Lower Extremity;  Surgeon: Elam Dutch, MD;  Location: [facility];  Service: Cardiovascular;  Laterality: N/A;  . CARDIAC CATHETERIZATION  12/28/2007     EF 75%. IT REVEALS NORMAL/SUPRANORMAL LEFT VENTRICULAR SYSTOLIC FUNCTION  . CARDIOVASCULAR STRESS TEST  03/25/2007   EF 78%  . CESAREAN SECTION  2003, 2009   x 2  . CHOLECYSTECTOMY  1993  . COLONOSCOPY  2014  . ENDOMETRIAL ABLATION  09/2010   and D&C  . HERNIA REPAIR  9163   umbilical  . LAPAROSCOPY    . PERIPHERAL VASCULAR BALLOON ANGIOPLASTY Left 04/30/2017   Procedure: Peripheral Vascular Balloon Angioplasty;  Surgeon: Elam Dutch, MD;  Location: [facility];  Service: Cardiovascular;  Laterality: Left;  SFA  .  TUBAL LIGATION    . UMBILICAL HERNIA REPAIR  2004  . US ECHOCARDIOGRAPHY  05/08/2008   EF 55-60%  . VENTRAL HERNIA REPAIR  06/15/2012   Procedure: LAPAROSCOPIC VENTRAL HERNIA;  Surgeon: Edward Jolly, MD;  Location: [facility];  Service: General;  Laterality: N/A;  LAPAROSCOPIC REPAIR VENTRAL HERNIA    SOCIAL HISTORY: Social History  Substance Use Topics  . Smoking status: Current Every Day Smoker    Packs/day: 1.00    Years: 25.00    Types: Cigarettes    Last attempt to quit: 09/18/2016  . Smokeless tobacco: Never Used  . Alcohol use No    FAMILY HISTORY: Family History  Problem Relation Age of Onset  . Hypertension Mother   . Lung cancer Mother        54  . Diabetes Maternal Grandmother   . Heart disease Father        pacemaker or defibrillator  . Heart attack Paternal Uncle   . Breast cancer Unknown   . Colon cancer Neg Hx     ROS: Review of Systems  Constitutional: Negative for weight loss.  Endo/Heme/Allergies:       Negative hypoglycemia  Psychiatric/Behavioral: Positive for depression. Negative for suicidal ideas. The patient is nervous/anxious.        Negative homicidal ideation    PHYSICAL EXAM: Blood pressure 116/70, pulse 69, temperature 98.3 F (36.8 C), height '5\' 5"'$  (1.651 m), weight 257 lb (116.6 kg), SpO2 97 %. Body mass index is 42.77 kg/m. Physical Exam  Constitutional: She is oriented to person, place, and time.  She appears well-developed and well-nourished.  Cardiovascular: Normal rate.   Pulmonary/Chest: Effort normal.  Musculoskeletal: Normal range of motion.  Neurological: She is alert and oriented to person, place, and time.  Skin: Skin is warm and dry.  Psychiatric: She has a normal mood and affect. Her behavior is normal. Judgment and thought content normal.    RECENT LABS AND TESTS: BMET    Component Value Date/Time   NA 143 06/23/2017 1247   NA 138 04/22/2017 1145   K 3.6 06/23/2017 1247   CL 106 06/23/2017 1247   CO2 24 06/23/2017 1221   GLUCOSE 132 (H) 06/23/2017 1247   BUN 27 (H) 06/23/2017 1247   BUN 19 04/22/2017 1145   CREATININE 1.20 (H) 06/23/2017 1247   CREATININE 1.08 (H) 11/12/2016 1040   CALCIUM 9.5 06/23/2017 1221   GFRNONAA 52 (L) 06/23/2017 1221   GFRAA 60 (L) 06/23/2017 1221   Lab Results  Component Value Date   HGBA1C 6.3 07/30/2017   HGBA1C 7.0 (H) 04/22/2017   HGBA1C 7.7 (H) 03/11/2017   HGBA1C 6.2 02/13/2014   Lab Results  Component Value Date   INSULIN 19.6 04/22/2017   CBC    Component Value Date/Time   WBC 7.7 06/23/2017 1221   RBC 4.78 06/23/2017 1221   HGB 14.6 06/23/2017 1247   HGB 14.3 04/22/2017 1145   HCT 43.0 06/23/2017 1247   HCT 42.3 04/22/2017 1145   PLT 228 06/23/2017 1221   MCV 88.1 06/23/2017 1221   MCV 90 04/22/2017 1145   MCH 30.1 06/23/2017 1221   MCHC 34.2 06/23/2017 1221   RDW 13.6 06/23/2017 1221   RDW 13.9 04/22/2017 1145   LYMPHSABS 2.4 06/23/2017 1221   LYMPHSABS 2.5 04/22/2017 1145   MONOABS 0.5 06/23/2017 1221   EOSABS 0.1 06/23/2017 1221   EOSABS 0.1 04/22/2017 1145   BASOSABS 0.0 06/23/2017 1221   BASOSABS 0.0 04/22/2017 1145  Iron/TIBC/Ferritin/ %Sat No results found for: IRON, TIBC, FERRITIN, IRONPCTSAT Lipid Panel     Component Value Date/Time   CHOL 130 04/22/2017 1145   TRIG 141 04/22/2017 1145   HDL 25 (L) 04/22/2017 1145   CHOLHDL 6 05/26/2016 1134   VLDL 26.0 05/26/2016 1134   LDLCALC 77  04/22/2017 1145   Hepatic Function Panel     Component Value Date/Time   PROT 7.3 06/23/2017 1221   PROT 7.5 04/22/2017 1145   ALBUMIN 3.6 06/23/2017 1221   ALBUMIN 4.3 04/22/2017 1145   AST 17 06/23/2017 1221   ALT 18 06/23/2017 1221   ALKPHOS 85 06/23/2017 1221   BILITOT 0.6 06/23/2017 1221   BILITOT 0.4 04/22/2017 1145   BILIDIR 0.1 02/28/2016 1414    TSH 1.040  04/22/17 1145 TSH  1.70  02/13/14  1145 TSH  1.795 12/27/07  1447  ASSESSMENT AND PLAN: Anxiety  Class 3 severe obesity with serious comorbidity and body mass index (BMI) of 40.0 to 44.9 in adult, unspecified obesity type ([place])  Type 2 diabetes mellitus with diabetic nephropathy, without long-term current use of insulin ([facility])  PLAN:   Anxiety  Lisa [name] has been offered anxiety and depression medicine but declines. She is advised that if she has any thought of self harm or suicide, to go to the nearest ER or call 911. She voices understanding, and agrees to follow up with [name] as planned.   Diabetes II Lisa [name] has been given extensive diabetes education by myself today including ideal fasting and post-prandial blood glucose readings, individual ideal HgA1c goals  and hypoglycemia prevention. We discussed the importance of good blood sugar control to decrease the likelihood of diabetic complications such as nephropathy, neuropathy, limb loss, blindness, coronary artery disease, and death. We discussed the importance of intensive lifestyle modification including diet, exercise and weight loss as the first line treatment for diabetes. Lisa [name] agrees to continue her diabetes medications and will follow up at the agreed upon time.  Obesity Lisa [name] is currently in the action stage of change. As such, her goal is to continue with weight loss efforts She has agreed to follow the Category 3 plan Lisa [name] has been instructed to work up to a goal of 150 minutes of combined cardio and strengthening exercise per week for weight loss and  overall health benefits. We discussed the following Behavioral Modification Stratagies today: increasing lean protein intake and emotional eating strategies   Lisa [name] has agreed to follow up with our clinic in 2 weeks. She was informed of the importance of frequent follow up visits to maximize her success with intensive lifestyle modifications for her multiple health conditions.  We spent > than 50% of the 15 minute visit on the counseling as documented in the note.  I have reviewed the above documentation for accuracy and completeness, and I agree with the above. -Lisa Duverney, PA-C  I have reviewed the above note and agree with the plan. Lisa Nip, MD   Office: [phone]  /  Fax: [phone]  [place]  Today's visit was # 10 out of 22.  Starting weight: 271 Starting date: 04/22/2017 Today's weight : Weight: 257 lb (116.6 kg)  Today's date: 09/30/2017 Total lbs lost to date: 14 (Patients must lose 7 lbs in the first 6 months to continue with counseling)   ASK: We discussed the diagnosis of obesity with Lisa [name] today and Lisa [name] agreed to give [name] permission to discuss obesity behavioral modification therapy today.  ASSESS: Lisa [name]  has the diagnosis of obesity and her BMI today is 42.7 Lisa [name] is in the action stage of change   ADVISE: Lisa [name] was educated on the multiple health risks of obesity as well as the benefit of weight loss to improve her health. She was advised of the need for long term treatment and the importance of lifestyle modifications.  AGREE: Multiple dietary modification options and treatment options were discussed and  Lisa [name] agreed to follow the Category 3 plan We discussed the following Behavioral Modification Stratagies today: increasing lean protein intake and emotional eating strategies

## 2017-10-01 ENCOUNTER — Encounter: Payer: Self-pay | Admitting: Physical Therapy

## 2017-10-06 ENCOUNTER — Ambulatory Visit: Payer: Managed Care, Other (non HMO) | Admitting: Sports Medicine

## 2017-10-13 ENCOUNTER — Other Ambulatory Visit: Payer: Self-pay | Admitting: Cardiovascular Disease

## 2017-10-14 ENCOUNTER — Ambulatory Visit (INDEPENDENT_AMBULATORY_CARE_PROVIDER_SITE_OTHER): Payer: Managed Care, Other (non HMO) | Admitting: Physician Assistant

## 2017-10-15 ENCOUNTER — Emergency Department (HOSPITAL_COMMUNITY)
Admission: EM | Admit: 2017-10-15 | Discharge: 2017-10-15 | Disposition: A | Discharge status: Home / Self Care | Payer: Managed Care, Other (non HMO) | Attending: Emergency Medicine | Admitting: Emergency Medicine

## 2017-10-15 ENCOUNTER — Telehealth: Payer: Self-pay | Admitting: Emergency Medicine

## 2017-10-15 ENCOUNTER — Emergency Department (HOSPITAL_COMMUNITY): Payer: Managed Care, Other (non HMO)

## 2017-10-15 ENCOUNTER — Encounter (HOSPITAL_COMMUNITY): Payer: Self-pay | Admitting: Emergency Medicine

## 2017-10-15 DIAGNOSIS — J189 Pneumonia, unspecified organism: Secondary | ICD-10-CM

## 2017-10-15 DIAGNOSIS — R079 Chest pain, unspecified: Secondary | ICD-10-CM

## 2017-10-15 DIAGNOSIS — Z79899 Other long term (current) drug therapy: Secondary | ICD-10-CM | POA: Diagnosis not present

## 2017-10-15 DIAGNOSIS — F17210 Nicotine dependence, cigarettes, uncomplicated: Secondary | ICD-10-CM | POA: Diagnosis not present

## 2017-10-15 DIAGNOSIS — I10 Essential (primary) hypertension: Secondary | ICD-10-CM | POA: Insufficient documentation

## 2017-10-15 DIAGNOSIS — Z7982 Long term (current) use of aspirin: Secondary | ICD-10-CM | POA: Diagnosis not present

## 2017-10-15 DIAGNOSIS — E119 Type 2 diabetes mellitus without complications: Secondary | ICD-10-CM | POA: Diagnosis not present

## 2017-10-15 DIAGNOSIS — Z794 Long term (current) use of insulin: Secondary | ICD-10-CM | POA: Insufficient documentation

## 2017-10-15 DIAGNOSIS — J449 Chronic obstructive pulmonary disease, unspecified: Secondary | ICD-10-CM | POA: Insufficient documentation

## 2017-10-15 DIAGNOSIS — J45909 Unspecified asthma, uncomplicated: Secondary | ICD-10-CM | POA: Insufficient documentation

## 2017-10-15 LAB — CBC
HEMATOCRIT: 41.9 % (ref 36.0–46.0)
HEMOGLOBIN: 14.4 g/dL (ref 12.0–15.0)
MCH: 30.6 pg (ref 26.0–34.0)
MCHC: 34.4 g/dL (ref 30.0–36.0)
MCV: 89.1 fL (ref 78.0–100.0)
Platelets: 232 10*3/uL (ref 150–400)
RBC: 4.7 MIL/uL (ref 3.87–5.11)
RDW: 13.7 % (ref 11.5–15.5)
WBC: 11.5 10*3/uL — AB (ref 4.0–10.5)

## 2017-10-15 LAB — BASIC METABOLIC PANEL
ANION GAP: 8 (ref 5–15)
BUN: 21 mg/dL — ABNORMAL HIGH (ref 6–20)
CO2: 23 mmol/L (ref 22–32)
Calcium: 9.7 mg/dL (ref 8.9–10.3)
Chloride: 106 mmol/L (ref 101–111)
Creatinine, Ser: 1.26 mg/dL — ABNORMAL HIGH (ref 0.44–1.00)
GFR, EST AFRICAN AMERICAN: 55 mL/min — AB (ref >60)
GFR, EST NON AFRICAN AMERICAN: 47 mL/min — AB (ref >60)
Glucose, Bld: 107 mg/dL — ABNORMAL HIGH (ref 65–99)
POTASSIUM: 3.2 mmol/L — AB (ref 3.5–5.1)
SODIUM: 137 mmol/L (ref 135–145)

## 2017-10-15 LAB — I-STAT TROPONIN, ED: Troponin i, poc: 0 ng/mL (ref 0.00–0.08)

## 2017-10-15 MED ORDER — GI COCKTAIL ~~LOC~~
30.0000 mL | Freq: Once | ORAL | Status: AC
  Administered 2017-10-15: 30 mL via ORAL
  Filled 2017-10-15: qty 30

## 2017-10-15 MED ORDER — LEVOFLOXACIN 500 MG PO TABS: 500.0000 mg | ORAL_TABLET | Freq: Every day | ORAL | 0 refills | Status: DC

## 2017-10-15 MED ORDER — HYDROMORPHONE HCL 1 MG/ML IJ SOLN
1.0000 mg | Freq: Once | INTRAMUSCULAR | Status: DC
  Filled 2017-10-15: qty 1

## 2017-10-15 MED ORDER — IOPAMIDOL (ISOVUE-370) INJECTION 76%
INTRAVENOUS | Status: AC
  Administered 2017-10-15: 100 mL
  Filled 2017-10-15: qty 100

## 2017-10-15 NOTE — ED Notes (Signed)
pts blood drawn  Pt having intermittent sharp lt upper abd pain or lt breast pain for 4 days when she has the sharp pains  She cannot breathe deeply  During the 30-60 seconds of pain

## 2017-10-15 NOTE — ED Notes (Signed)
IV nurse inserted 20g. IV saline lock at left ac.

## 2017-10-15 NOTE — ED Provider Notes (Signed)
[facility] EMERGENCY DEPARTMENT Provider Note   CSN: [id number] Arrival date & time: 10/15/17  0020     History   Chief Complaint Chief Complaint  Patient presents with  . Chest Pain    HPI STAPHANY DITTON is a 56 y.o. female.  HPI Patient is a 56 year old female presents to the emergency department with complaints of chest discomfort for the past 4 days with some shortness of breath.  She also reports a sharp pain in her epigastric region.  Some cough.  No unilateral leg swelling.  No history of DVT or pulmonary embolism.  Symptoms are moderate in severity  Past Medical History:  Diagnosis Date  . Allergic rhinitis   . Asthma   . Breast nodule    right breast, to see dr Helane Rima 06-20-2012 for   . Chest pain, non-cardiac   . Chronic headaches   . Cigarette smoker   . Complication of anesthesia 05-17-2012   trouble breathing after colonscopy, needed nebulizer  . Diverticulosis   . GERD (gastroesophageal reflux disease)   . Hepatic hemangioma   . Hypertension   . IBS (irritable bowel syndrome)   . Obesity   . Prosthetic eye globe   . Pyloric erosion   . Sleep apnea    CPAP setting varies from 4-10  . Type 2 diabetes mellitus ([place]) 03/11/2017    Patient Active Problem List   Diagnosis Date Noted  . Hyperlipidemia associated with type 2 diabetes mellitus ([place]) 09/06/2017  . Breast mass 08/03/2017  . Chronic hip pain 07/29/2017  . Type 2 diabetes mellitus ([place]) 03/11/2017  . PAD (peripheral artery disease) ([place]) 01/19/2017  . 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 Left knee pain 05/15/2016  . Flank pain 04/21/2016  . Abdominal pain 02/28/2016  . GERD (gastroesophageal reflux disease) 02/14/2016  . Morbid obesity ([place]) 02/14/2016  . Tobacco use disorder 08/12/2015  . Pulmonary nodule, right 04/11/2015  . COPD (chronic obstructive pulmonary disease) ([place]) 01/18/2015  . Mediastinal lymphadenopathy  01/18/2015  . DOE (dyspnea on exertion) 03/08/2014  . Migraine without aura 07/26/2013  . Sleep apnea 02/11/2012  . Chest discomfort 10/12/2011  . Hypertension associated with diabetes ([place]) 10/12/2011    Past Surgical History:  Procedure Laterality Date  . ABDOMINAL AORTOGRAM W/LOWER EXTREMITY N/A 04/30/2017   Procedure: Abdominal Aortogram w/Lower Extremity;  Surgeon: Elam Dutch, MD;  Location: [facility];  Service: Cardiovascular;  Laterality: N/A;  . CARDIAC CATHETERIZATION  12/28/2007   EF 75%. IT REVEALS NORMAL/SUPRANORMAL LEFT VENTRICULAR SYSTOLIC FUNCTION  . CARDIOVASCULAR STRESS TEST  03/25/2007   EF 78%  . CESAREAN SECTION  2003, 2009   x 2  . CHOLECYSTECTOMY  1993  . COLONOSCOPY  2014  . ENDOMETRIAL ABLATION  09/2010   and D&C  . HERNIA REPAIR  8768   umbilical  . LAPAROSCOPY    . PERIPHERAL VASCULAR BALLOON ANGIOPLASTY Left 04/30/2017   Procedure: Peripheral Vascular Balloon Angioplasty;  Surgeon: Elam Dutch, MD;  Location: [facility];  Service: Cardiovascular;  Laterality: Left;  SFA  . TUBAL LIGATION    . UMBILICAL HERNIA REPAIR  2004  . US ECHOCARDIOGRAPHY  05/08/2008   EF 55-60%  . VENTRAL HERNIA REPAIR  06/15/2012   Procedure: LAPAROSCOPIC VENTRAL HERNIA;  Surgeon: Edward Jolly, MD;  Location: [facility];  Service: General;  Laterality: N/A;  LAPAROSCOPIC REPAIR VENTRAL HERNIA    OB History    Gravida Para Term  Preterm AB Living   _0 SAB TAB Ectopic Multiple Live Births                   Home Medications    Prior to Admission medications   Medication Sig Start Date End Date Taking? Authorizing Provider  albuterol (PROVENTIL HFA;VENTOLIN HFA) 108 (90 Base) MCG/ACT inhaler Inhale 2 puffs into the lungs every 6 (six) hours as needed for wheezing or shortness of breath. 07/30/17  Yes Collene Gobble, MD  aspirin 325 MG EC tablet Take 1 tablet (325 mg total) by mouth daily. 04/30/17  Yes Fields, Jessy Oto, MD  atorvastatin  (LIPITOR) 40 MG tablet Take 1 tablet (40 mg total) by mouth daily. 01/19/17  Yes Nche, Charlene Brooke, NP  buPROPion (WELLBUTRIN SR) 150 MG 12 hr tablet Take 150 mg by mouth in the morning 04/17/17  Yes [provider]  carvedilol (COREG) 25 MG tablet TAKE 1 TABLET (25 MG TOTAL) BY MOUTH 2 (TWO) TIMES DAILY. 06/17/17  Yes Golden Circle, FNP  dicyclomine (BENTYL) 10 MG capsule TAKE 1 CAPSULE (10 MG TOTAL) BY MOUTH 3 (THREE) TIMES DAILY AS NEEDED FOR SPASMS. Patient taking differently: Take 10 mg daily by mouth.  02/04/17  Yes Ladene Artist, MD  doxazosin (CARDURA) 2 MG tablet Take 2 mg by mouth 2 (two) times daily.  12/24/16  Yes [provider]  liraglutide 18 MG/3ML SOPN Inject 0.2 mLs (1.2 mg total) into the skin every morning. 08/05/17  Yes Alene Mires, Sahar M, PA-C  losartan (COZAAR) 100 MG tablet TAKE 1 TABLET (100 MG TOTAL) BY MOUTH DAILY. 06/03/17  Yes Golden Circle, FNP  Multiple Vitamin (MULTIVITAMIN WITH MINERALS) TABS tablet Take 1 tablet by mouth daily.   Yes [provider]  omeprazole (PRILOSEC) 40 MG capsule TAKE 1 CAPSULE (40 MG TOTAL) BY MOUTH 2 (TWO) TIMES DAILY. Patient taking differently: Take 40 mg daily by mouth.  06/04/17  Yes Ladene Artist, MD  spironolactone (ALDACTONE) 50 MG tablet Take 1 tablet (50 mg total) by mouth daily. 12/08/16  Yes Nahser, Wonda Cheng, MD  Blood Glucose Monitoring Suppl (ONE TOUCH ULTRA MINI) w/Device KIT Use meter to check blood sugars 1-4 times daily as instructed. 03/24/17   Golden Circle, FNP  doxazosin (CARDURA) 2 MG tablet TAKE 1 TABLET (2 MG TOTAL) BY MOUTH 2 (TWO) TIMES DAILY. Patient not taking: Reported on 10/15/2017 10/13/17   Nahser, Wonda Cheng, MD  glucose blood (ONE TOUCH ULTRA TEST) test strip 1 each by Other route as directed. Use one strip per test. Test blood sugars 1-4 times daily as instructed. 09/20/17   Vivi Barrack, MD  Glycerin, Laxative, 1 g SUPP Use rectally as needed if no bowel movement after  laxatives Patient not taking: Reported on 10/15/2017 09/20/17   Vivi Barrack, MD  ONE [facility] LANCETS MISC Use to check blood sugars twice a day 08/31/17   Dennard Nip D, MD  polyethylene glycol (MIRALAX / GLYCOLAX) packet Take 17 g by mouth daily. Patient not taking: Reported on 10/15/2017 09/20/17   Vivi Barrack, MD  senna (SENOKOT) 8.6 MG TABS tablet Take 1 tablet (8.6 mg total) by mouth daily as needed for mild constipation. Patient not taking: Reported on 10/15/2017 09/20/17   Vivi Barrack, MD    Family History Family History  Problem Relation Age of Onset  . Hypertension Mother   . Lung cancer Mother  86  . Diabetes Maternal Grandmother   . Heart disease Father        pacemaker or defibrillator  . Heart attack Paternal Uncle   . Breast cancer Unknown   . Colon cancer Neg Hx     Social History Social History   Tobacco Use  . Smoking status: Current Every Day Smoker    Packs/day: 1.00    Years: 25.00    Pack years: 25.00    Types: Cigarettes    Last attempt to quit: 09/18/2016    Years since quitting: 1.0  . Smokeless tobacco: Never Used  Substance Use Topics  . Alcohol use: No    Alcohol/week: 0.0 oz  . Drug use: No     Allergies   Almond oil; Apple; Ceftin [cefuroxime]; Cherry extract; Other; Guaifenesin & derivatives; Hctz [hydrochlorothiazide]; Morphine and related; Naproxen; and Lisinopril   Review of Systems Review of Systems  All other systems reviewed and are negative.    Physical Exam Updated Vital Signs BP (!) 143/121   Pulse 67   Temp 98.2 F (36.8 C) (Oral)   Resp 17   Ht _0  (1.676 m)   Wt 113.4 kg (250 lb)   SpO2 98%   BMI 40.35 kg/m   Physical Exam  Constitutional: She is oriented to person, place, and time. She appears well-developed and well-nourished. No distress.  HENT:  Head: Normocephalic and atraumatic.  Eyes: EOM are normal.  Neck: Normal range of motion.  Cardiovascular: Normal rate, regular rhythm and  normal heart sounds.  Pulmonary/Chest: Effort normal and breath sounds normal.  Abdominal: Soft. She exhibits no distension. There is no tenderness.  Musculoskeletal: Normal range of motion. She exhibits no edema.  Neurological: She is alert and oriented to person, place, and time.  Skin: Skin is warm and dry.  Psychiatric: She has a normal mood and affect. Judgment normal.  Nursing note and vitals reviewed.    ED Treatments / Results  Labs (all labs ordered are listed, but only abnormal results are displayed) Labs Reviewed  BASIC METABOLIC PANEL - Abnormal; Notable for the following components:      Result Value   Potassium 3.2 (*)    Glucose, Bld 107 (*)    BUN 21 (*)    Creatinine, Ser 1.26 (*)    GFR calc non Af Amer 47 (*)    GFR calc Af Amer 55 (*)    All other components within normal limits  CBC - Abnormal; Notable for the following components:   WBC 11.5 (*)    All other components within normal limits  HEPATIC FUNCTION PANEL  LIPASE, BLOOD  I-STAT TROPONIN, ED    EKG  EKG Interpretation  Date/Time:  Friday October 15 2017 00:28:20 EST Ventricular Rate:  68 PR Interval:    QRS Duration: 107 QT Interval:  400 QTC Calculation: 426 R Axis:   -5 Text Interpretation:  Sinus rhythm Borderline prolonged PR interval No significant change was found Confirmed by Jola Schmidt [phone]) on 10/15/2017 1:59:52 AM       Radiology Dg Chest 2 View  Result Date: 10/15/2017 CLINICAL DATA:  56 year old female with chest pain. EXAM: CHEST  2 VIEW COMPARISON:  Chest CT dated 07/15/2017 FINDINGS: Mild hazy appearance of the mid to lower lung fields likely corresponding to the mosaic appearance seen on the prior CT. There is no focal consolidation, pleural effusion, or pneumothorax. The cardiac silhouette is within normal limits. No acute osseous pathology. IMPRESSION: No focal consolidation.  Electronically Signed   By: Anner Crete M.D.   On: 10/15/2017 01:24     Procedures Procedures (including critical care time)  Medications Ordered in ED Medications - No data to display   Initial Impression / Assessment and Plan / ED Course  I have reviewed the triage vital signs and the nursing notes.  Pertinent labs & imaging results that were available during my care of the patient were reviewed by me and considered in my medical decision making (see chart for details).     Patient is overall well-appearing.  She feels much better at this time.  CT imaging negative for acute pulmonary embolism.  Questionable infiltrate.  Home with antibiotics.  Primary care follow-up.  Abdominal exam is nontender.  All questions answered.  She understands to return to the emergency department for new or worsening symptoms.  Doubt ACS  Final Clinical Impressions(s) / ED Diagnoses   Final diagnoses:  Chest pain, unspecified type  Community acquired pneumonia, unspecified laterality    ED Discharge Orders    None       Jola Schmidt, MD 10/15/17 [phone]

## 2017-10-15 NOTE — Telephone Encounter (Signed)
Spoke with pt, she had pneumonia according to CT scan. She feels they may have been looking at her spots that have always been there. I advised her that radiologist are trained to read x-rays, She had pain in the chest, arms, and neck, and thought she was having a heart attack. RB can you review the scans?

## 2017-10-15 NOTE — ED Notes (Signed)
Unable to get blood or start another iv   Iv team contacted

## 2017-10-15 NOTE — ED Triage Notes (Signed)
Pt arrives via EMS from home with c/o chest pain x4 days, states it's worse today. States pain is intermittent. Described as sharp, like someone is punching her. Pt given 1SL nitro and 324 MG aspirin by EMS, reports nitro was effective for pain. IV established in field. Dry cough noted.

## 2017-10-15 NOTE — ED Notes (Signed)
Patient transported to CT scan . 

## 2017-10-19 ENCOUNTER — Ambulatory Visit (INDEPENDENT_AMBULATORY_CARE_PROVIDER_SITE_OTHER): Payer: Managed Care, Other (non HMO) | Admitting: Physician Assistant

## 2017-10-19 ENCOUNTER — Encounter: Payer: Self-pay | Admitting: Family Medicine

## 2017-10-19 ENCOUNTER — Ambulatory Visit (INDEPENDENT_AMBULATORY_CARE_PROVIDER_SITE_OTHER): Payer: Managed Care, Other (non HMO) | Admitting: Family Medicine

## 2017-10-19 ENCOUNTER — Encounter (INDEPENDENT_AMBULATORY_CARE_PROVIDER_SITE_OTHER): Payer: Self-pay

## 2017-10-19 ENCOUNTER — Ambulatory Visit: Payer: Managed Care, Other (non HMO) | Admitting: Sports Medicine

## 2017-10-19 VITALS — BP 124/80 | HR 71 | Temp 98.6°F | Ht 66.0 in | Wt 262.6 lb

## 2017-10-19 DIAGNOSIS — R079 Chest pain, unspecified: Secondary | ICD-10-CM

## 2017-10-19 NOTE — Patient Instructions (Addendum)
The number one thing you can do to reduce your risk of early death is quit smoking  Luckily you had a great workup in the ER and there is no cancer or blood clot  Dr. Acie Fredrickson mentioned repeating stress test potentially- I want cardiology to strongly consider this and we have referred you back to them.   I would also try the albuterol to see if that helps at all- it may not but it would be a good additional piece of information to have

## 2017-10-19 NOTE — Progress Notes (Signed)
Subjective:  Lisa [name] is a 56 y.o. year old very pleasant female patient who presents for/with See problem oriented charting ROS- admits to left sided chest pain now largely resolved. Has some shortness of breath includign with exertion. No worsening edema. No nausea or vomiting.    Past Medical History-  Patient Active Problem List   Diagnosis Date Noted  . Type 2 diabetes mellitus ([place]) 03/11/2017    Priority: High  . PAD (peripheral artery disease) ([place]) 01/19/2017    Priority: High  . 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 Morbid obesity ([place]) 02/14/2016    Priority: High  . Tobacco use disorder 08/12/2015    Priority: High  . COPD (chronic obstructive pulmonary disease) ([place]) 01/18/2015    Priority: High  . Breast mass 08/03/2017    Priority: Medium  . GERD (gastroesophageal reflux disease) 02/14/2016    Priority: Medium  . DOE (dyspnea on exertion) 03/08/2014    Priority: Medium  . Migraine without aura 07/26/2013    Priority: Medium  . Sleep apnea 02/11/2012    Priority: Medium  . Chest discomfort 10/12/2011    Priority: Medium  . Hypertension associated with diabetes ([place]) 10/12/2011    Priority: Medium  . Chronic hip pain 07/29/2017    Priority: Low  . Left knee pain 05/15/2016    Priority: Low  . Flank pain 04/21/2016    Priority: Low  . Abdominal pain 02/28/2016    Priority: Low  . Pulmonary nodule, right 04/11/2015    Priority: Low  . Mediastinal lymphadenopathy 01/18/2015    Priority: Low  . Hyperlipidemia associated with type 2 diabetes mellitus ([place]) 09/06/2017    Medications- reviewed and updated Current Outpatient Medications  Medication Sig Dispense Refill  . albuterol (PROVENTIL HFA;VENTOLIN HFA) 108 (90 Base) MCG/ACT inhaler Inhale 2 puffs into the lungs every 6 (six) hours as needed for wheezing or shortness of breath. 1 Inhaler 3  . aspirin 325 MG EC tablet Take 1 tablet (325  mg total) by mouth daily. 30 tablet 10  . atorvastatin (LIPITOR) 40 MG tablet Take 1 tablet (40 mg total) by mouth daily. 90 tablet 3  . Blood Glucose Monitoring Suppl (ONE TOUCH ULTRA MINI) w/Device KIT Use meter to check blood sugars 1-4 times daily as instructed. 1 each 0  . buPROPion (WELLBUTRIN SR) 150 MG 12 hr tablet Take 150 mg by mouth in the morning  0  . carvedilol (COREG) 25 MG tablet TAKE 1 TABLET (25 MG TOTAL) BY MOUTH 2 (TWO) TIMES DAILY. 180 tablet 3  . dicyclomine (BENTYL) 10 MG capsule TAKE 1 CAPSULE (10 MG TOTAL) BY MOUTH 3 (THREE) TIMES DAILY AS NEEDED FOR SPASMS. (Patient taking differently: Take 10 mg daily by mouth. ) 90 capsule 3  . doxazosin (CARDURA) 2 MG tablet TAKE 1 TABLET (2 MG TOTAL) BY MOUTH 2 (TWO) TIMES DAILY. 180 tablet 0  . glucose blood (ONE TOUCH ULTRA TEST) test strip 1 each by Other route as directed. Use one strip per test. Test blood sugars 1-4 times daily as instructed. 100 each 3  . Glycerin, Laxative, 1 g SUPP Use rectally as needed if no bowel movement after laxatives 12 suppository 1  . levofloxacin (LEVAQUIN) 500 MG tablet Take 1 tablet (500 mg total) daily by mouth. 7 tablet 0  . liraglutide 18 MG/3ML SOPN Inject 0.2 mLs (1.2 mg total) into the skin every morning. 2 pen 0  . losartan (COZAAR)  100 MG tablet TAKE 1 TABLET (100 MG TOTAL) BY MOUTH DAILY. 90 tablet 1  . Multiple Vitamin (MULTIVITAMIN WITH MINERALS) TABS tablet Take 1 tablet by mouth daily.    Marland Kitchen omeprazole (PRILOSEC) 40 MG capsule TAKE 1 CAPSULE (40 MG TOTAL) BY MOUTH 2 (TWO) TIMES DAILY. (Patient taking differently: Take 40 mg daily by mouth. ) 180 capsule 0  . ONE TOUCH LANCETS MISC Use to check blood sugars twice a day 200 each 0  . polyethylene glycol (MIRALAX / GLYCOLAX) packet Take 17 g by mouth daily. 14 each 0  . senna (SENOKOT) 8.6 MG TABS tablet Take 1 tablet (8.6 mg total) by mouth daily as needed for mild constipation. 120 each 0  . spironolactone (ALDACTONE) 50 MG tablet Take 1  tablet (50 mg total) by mouth daily. 90 tablet 2   No current facility-administered medications for this visit.     Objective: BP 124/80 (BP Location: Left Arm, Patient Position: Sitting, Cuff Size: Large)   Pulse 71   Temp 98.6 F (37 C) (Oral)   Ht '5\' 6"'$  (1.676 m)   Wt 262 lb 9.6 oz (119.1 kg)   SpO2 96%   BMI 42.38 kg/m  Gen: NAD, resting comfortably CV: RRR no murmurs rubs or gallops Lungs: CTAB no crackles, wheeze, rhonchi No chest wall pain Abdomen: soft/nontender/nondistended/normal bowel sounds. No rebound or guarding. Morbid obesity Ext: stable edema Skin: warm, dry  Assessment/Plan:  Chest pain, unspecified type - Plan: Ambulatory referral to Cardiology S: Patient presented to ER on 10/15/17 with chest discomfort for 4 days along with shortness of breath. When went to hospital she was having left sided chest pain that felt like someone was punching her heart- nitroglycerin and aspirin did not help at all. They were going to give her dilaudid- she did not want this due to morphine in past causing anger. Also with some sharp epigastric pain, mild cough. Troponin negative- EKG reassuringShe had CT imaging to rule out pulmonary embolism which was negative but did have an infiltrate ("Diffuse hazy bilateral airspace opacities may reflect pulmonary edema or possibly infection.")which was thought to respresent CAP. Sent home on 7 days of levaquin and advised PCP follow up. Abdominal exam was benign.   Overall she feels she has improved slightly- pain has improved in chest but not resolved. She states when went up her 3 stairs last night she had worsening chest pain slightly and was very winded. Felt improved with rest- both shortness of breath and chest pain went back down. Pain level at present about 5-6/10. Does not complain of abdominal pain  Still smoking 3/4 PPD. Worried about early death but not willing to quit at this time A/P: 89 F with high cardiac risk with smoking, PAD,  diabetes, morbid obesity presenting for chest pain follow up. MI and PE have been ruled out. Still does not rule out potential cardiac cause. Last visit with Dr. Acie Fredrickson mentioned low threshold to repeat stress testing- I would ask for his opinion on that at this time.   Additionally with potential COPD I have encouraged patient to trial inhaler. Extensive counseling in patient with high anxiety. We also spent time discussing quitting smoking which she is not ready to do.  Patient Instructions  The number one thing you can do to reduce your risk of early death is quit smoking  Luckily you had a great workup in the ER and there is no cancer or blood clot  Dr. Acie Fredrickson mentioned repeating stress test  potentially- I want cardiology to strongly consider this and we have referred you back to them.   I would also try the albuterol to see if that helps at all- it may not but it would be a good additional piece of information to have  Future Appointments  Date Time Provider [place]  10/25/2017  3:00 PM Leonie Man, MD [facility]  10/28/2017 11:20 AM Gerda Diss, DO [facility] None  11/11/2017 11:00 AM [facility] 4 [facility]  11/11/2017 12:00 PM [facility]  11/11/2017 12:15 PM Nickel, Sharmon Leyden, NP [facility]  12/28/2017 11:00 AM Penumalli, Earlean Polka, MD [facility] None  02/14/2018 11:10 AM [facility] [name] 1 [facility]   Orders Placed This Encounter  Procedures  . Ambulatory referral to Cardiology    Referral Priority:   Urgent    Referral Type:   Consultation    Referral Reason:   Specialty Services Required    Requested Specialty:   Cardiology    Number of Visits Requested:   1   The duration of face-to-face time during this visit was greater than 25 minutes. Greater than 50% of this time was spent in counseling, explanation of diagnosis, planning of further management, and/or coordination of care including discussing potential causes of pain,  explaining prior and future workup, discussing risk factors particularly smoking contributing to her risks.   Return precautions advised.  Garret Reddish, MD

## 2017-10-20 NOTE — Telephone Encounter (Signed)
LMTCB

## 2017-10-22 NOTE — Telephone Encounter (Signed)
LMTCB x 2; stating RB is back in the office on Tuesday 10/26/17 and will ask that he advise then.

## 2017-10-25 ENCOUNTER — Ambulatory Visit: Payer: Managed Care, Other (non HMO) | Admitting: Cardiology

## 2017-10-25 ENCOUNTER — Telehealth: Payer: Self-pay | Admitting: Cardiovascular Disease

## 2017-10-25 NOTE — Telephone Encounter (Signed)
Lisa [name] is calling because she had some issues this morning with pian in her neck (like a numbing from her Jaw down to the top of her shoulder ) heart rate was 104.  No Chest Pains .Marland Kitchen Heart rate has gone down to 88, and she has taken her medicine .Marland KitchenPlease call .Marland Kitchen Thanks

## 2017-10-25 NOTE — Telephone Encounter (Signed)
Spoke with patient who states she woke up this morning and noticed HR was approximately 104 bpm. She states she took her morning medications and HR came down to 90's bpm. She states she was having pain and tingling in her neck and shoulder that has now resolved. She reports recent increase in life stressors. She has been diagnosed with diabetes and has been working on weight loss and quitting smoking. She states she has not been feeling well recently and was taken to the hospital on 11/16 by EMS because she thought she was having a heart attack. She states she was diagnosed with pneumonia and had f/u with PCP last week. She states he advised that he did not think she had pneumonia and that he thinks her problems are heart related. He scheduled her an appointment at our office today but with the wrong MD. She is scheduled to see Dr. Acie Fredrickson on 12/5 but feels that she needs to be seen this week. I scheduled her with Ermalinda Barrios, PA tomorrow, 11/27. I advised her to increase her dietary intake of K+ as last level on 11/16 was 3.2 mmol/L and to make sure she is staying well hydrated as her BUN and serum creatinine were also elevated. She verbalized understanding and agreement with plan and thanked me for the call.

## 2017-10-25 NOTE — Telephone Encounter (Signed)
Left message for patient to call back  

## 2017-10-25 NOTE — Telephone Encounter (Signed)
Follow up ° ° ° °Patient returning call.  Please call °

## 2017-10-26 ENCOUNTER — Encounter: Payer: Self-pay | Admitting: Physician Assistant

## 2017-10-26 ENCOUNTER — Ambulatory Visit (INDEPENDENT_AMBULATORY_CARE_PROVIDER_SITE_OTHER): Payer: Managed Care, Other (non HMO) | Admitting: Physician Assistant

## 2017-10-26 ENCOUNTER — Telehealth (HOSPITAL_COMMUNITY): Payer: Self-pay | Admitting: *Deleted

## 2017-10-26 VITALS — BP 120/80 | HR 81 | Resp 16 | Ht 65.75 in | Wt 261.0 lb

## 2017-10-26 DIAGNOSIS — E1169 Type 2 diabetes mellitus with other specified complication: Secondary | ICD-10-CM | POA: Diagnosis not present

## 2017-10-26 DIAGNOSIS — R0609 Other forms of dyspnea: Secondary | ICD-10-CM | POA: Diagnosis not present

## 2017-10-26 DIAGNOSIS — I10 Essential (primary) hypertension: Secondary | ICD-10-CM | POA: Diagnosis not present

## 2017-10-26 DIAGNOSIS — R079 Chest pain, unspecified: Secondary | ICD-10-CM | POA: Diagnosis not present

## 2017-10-26 DIAGNOSIS — E785 Hyperlipidemia, unspecified: Secondary | ICD-10-CM

## 2017-10-26 DIAGNOSIS — I152 Hypertension secondary to endocrine disorders: Secondary | ICD-10-CM

## 2017-10-26 DIAGNOSIS — E1159 Type 2 diabetes mellitus with other circulatory complications: Secondary | ICD-10-CM | POA: Diagnosis not present

## 2017-10-26 NOTE — Telephone Encounter (Signed)
Please let her know that I reviewed her CT scan of the chest.  It shows areas of haziness and inflammation that are similar to but more prominent than those we have seen on her prior CT scans.  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  If she is flaring, feeling worse, having more shortness of breath and I believe we need to set her up for an office visit to review the scan and her symptoms.

## 2017-10-26 NOTE — Patient Instructions (Signed)
Medication Instructions:  1. Your physician recommends that you continue on your current medications as directed. Please refer to the Current Medication list given to you today.    Labwork: TODAY BMET  Testing/Procedures: Your physician has requested that you have a lexiscan myoview. For further information please visit [email or web address]. Please follow instruction sheet, as given. THIS WEEK PER MICHELE LENZE, PAC     Follow-Up: KEEP YOUR APPT WITH DR. Cathie Olden NEXT WEEK  Any Other Special Instructions Will Be Listed Below (If Applicable).     If you need a refill on your cardiac medications before your next appointment, please call your pharmacy.

## 2017-10-26 NOTE — Telephone Encounter (Signed)
Called pt and advised message from the provider. Pt understood and verbalized understanding. Nothing further is needed.   She made an appt with RB tomorrow at 1:30pm.

## 2017-10-26 NOTE — Telephone Encounter (Signed)
Patient called to give instructions for nuclear on 11/01/17. Patient starting a new job 12/03 and not sure if she will be able to talk with her husband. She has a Dr appt with pulmonary tomorrow and wants to see what he thinks. She will call us back tomorrow and let us know.Evynn Boutelle, Ranae Palms

## 2017-10-26 NOTE — Progress Notes (Signed)
Cardiology Office Note    Date:  10/26/2017   ID:  Tamya, Denardo Jul 19, 1961, MRN [id number]  PCP:  Marin Olp, MD  Cardiologist: Nahser  Chief Complaint  Patient presents with  . Hospitalization Follow-up    CP, dizziness     History of Present Illness:  Lisa [name] is a 56 y.o. female with history of hypertension and chest pain with normal cardiac cath 2009, Myoview 2015 was negative.  Has hypertension, PAD status post angioplasty of the left mid superficial femoral artery 04/30/17, COPD with ongoing cigarette abuse.  2D echo 3/32/95 normal systolic function ejection fraction 60-65% suggest bubble study to rule out PFO.    Patient was in the ER 10/15/17 with sharp chest pain and shortness of breath.CTA 09/2017 no evidence of PE diffuse hazy bilateral airspace opacities may reflect pulmonary edema or possible infection, borderline prominent bilateral hilar nodes previously noted right middle lobe pulmonary nodule relatively stable 5-6 mm.  WBC was slightly elevated.  Creatinine 1.26, potassium 3.2, troponins negative she was sent home on antibiotics.  Saw primary care back 10/19/17 and chest pain had resolved.  He felt that some of her symptoms may be cardiac and referred her back here.  Patient comes in today saying she has different types of chest pain in the center of her chest that sometimes radiate to her right chest and other times radiate to her left chest.  Also feels like a fist was hitting her chest.  It occurs when she moves around.  Chronic dyspnea on exertion.    Past Medical History:  Diagnosis Date  . Allergic rhinitis   . Asthma   . Breast nodule    right breast, to see dr Helane Rima 06-20-2012 for   . Chest pain, non-cardiac   . Chronic headaches   . Cigarette smoker   . Complication of anesthesia 05-17-2012   trouble breathing after colonscopy, needed nebulizer  . Diverticulosis   . GERD (gastroesophageal reflux disease)   . Hepatic hemangioma     . Hypertension   . IBS (irritable bowel syndrome)   . Obesity   . Prosthetic eye globe   . Pyloric erosion   . Sleep apnea    CPAP setting varies from 4-10  . Type 2 diabetes mellitus ([place]) 03/11/2017    Past Surgical History:  Procedure Laterality Date  . ABDOMINAL AORTOGRAM W/LOWER EXTREMITY N/A 04/30/2017   Procedure: Abdominal Aortogram w/Lower Extremity;  Surgeon: Elam Dutch, MD;  Location: [facility];  Service: Cardiovascular;  Laterality: N/A;  . CARDIAC CATHETERIZATION  12/28/2007   EF 75%. IT REVEALS NORMAL/SUPRANORMAL LEFT VENTRICULAR SYSTOLIC FUNCTION  . CARDIOVASCULAR STRESS TEST  03/25/2007   EF 78%  . CESAREAN SECTION  2003, 2009   x 2  . CHOLECYSTECTOMY  1993  . COLONOSCOPY  2014  . ENDOMETRIAL ABLATION  09/2010   and D&C  . HERNIA REPAIR  1884   umbilical  . LAPAROSCOPY    . PERIPHERAL VASCULAR BALLOON ANGIOPLASTY Left 04/30/2017   Procedure: Peripheral Vascular Balloon Angioplasty;  Surgeon: Elam Dutch, MD;  Location: [facility];  Service: Cardiovascular;  Laterality: Left;  SFA  . TUBAL LIGATION    . UMBILICAL HERNIA REPAIR  2004  . US ECHOCARDIOGRAPHY  05/08/2008   EF 55-60%  . VENTRAL HERNIA REPAIR  06/15/2012   Procedure: LAPAROSCOPIC VENTRAL HERNIA;  Surgeon: Edward Jolly, MD;  Location: [facility];  Service: General;  Laterality: N/A;  LAPAROSCOPIC REPAIR VENTRAL HERNIA    Current Medications: Current Meds  Medication Sig  . albuterol (PROVENTIL HFA;VENTOLIN HFA) 108 (90 Base) MCG/ACT inhaler Inhale 2 puffs into the lungs every 6 (six) hours as needed for wheezing or shortness of breath.  Marland Kitchen aspirin 325 MG EC tablet Take 1 tablet (325 mg total) by mouth daily.  Marland Kitchen atorvastatin (LIPITOR) 40 MG tablet Take 1 tablet (40 mg total) by mouth daily.  . Blood Glucose Monitoring Suppl (ONE TOUCH ULTRA MINI) w/Device KIT Use meter to check blood sugars 1-4 times daily as instructed.  Marland Kitchen buPROPion (WELLBUTRIN SR) 150 MG 12 hr tablet Take  150 mg by mouth in the morning  . carvedilol (COREG) 25 MG tablet TAKE 1 TABLET (25 MG TOTAL) BY MOUTH 2 (TWO) TIMES DAILY.  Marland Kitchen dicyclomine (BENTYL) 10 MG capsule TAKE 1 CAPSULE (10 MG TOTAL) BY MOUTH 3 (THREE) TIMES DAILY AS NEEDED FOR SPASMS. (Patient taking differently: Take 10 mg daily by mouth. )  . doxazosin (CARDURA) 2 MG tablet TAKE 1 TABLET (2 MG TOTAL) BY MOUTH 2 (TWO) TIMES DAILY.  Marland Kitchen glucose blood (ONE TOUCH ULTRA TEST) test strip 1 each by Other route as directed. Use one strip per test. Test blood sugars 1-4 times daily as instructed.  . Glycerin, Laxative, 1 g SUPP Use rectally as needed if no bowel movement after laxatives  . levofloxacin (LEVAQUIN) 500 MG tablet Take 1 tablet (500 mg total) daily by mouth.  . liraglutide 18 MG/3ML SOPN Inject 0.2 mLs (1.2 mg total) into the skin every morning.  Marland Kitchen losartan (COZAAR) 100 MG tablet TAKE 1 TABLET (100 MG TOTAL) BY MOUTH DAILY.  . Multiple Vitamin (MULTIVITAMIN WITH MINERALS) TABS tablet Take 1 tablet by mouth daily.  Marland Kitchen omeprazole (PRILOSEC) 40 MG capsule TAKE 1 CAPSULE (40 MG TOTAL) BY MOUTH 2 (TWO) TIMES DAILY. (Patient taking differently: Take 40 mg daily by mouth. )  . ONE TOUCH LANCETS MISC Use to check blood sugars twice a day  . polyethylene glycol (MIRALAX / GLYCOLAX) packet Take 17 g by mouth daily.  Marland Kitchen senna (SENOKOT) 8.6 MG TABS tablet Take 1 tablet (8.6 mg total) by mouth daily as needed for mild constipation.  Marland Kitchen spironolactone (ALDACTONE) 50 MG tablet Take 1 tablet (50 mg total) by mouth daily.     Allergies:   Almond oil; Apple; Ceftin [cefuroxime]; Cherry extract; Other; Guaifenesin & derivatives; Hctz [hydrochlorothiazide]; Morphine and related; Naproxen; and Lisinopril   Social History   Socioeconomic History  . Marital status: Married    Spouse name: Science writer  . Number of children: 2  . Years of education: 14+  . Highest education level: None  Social Needs  . Financial resource strain: None  . Food insecurity -  worry: None  . Food insecurity - inability: None  . Transportation needs - medical: None  . Transportation needs - non-medical: None  Occupational History  . Occupation: stay at home mom  . Occupation: Ship broker  Tobacco Use  . Smoking status: Current Every Day Smoker    Packs/day: 1.00    Years: 25.00    Pack years: 25.00    Types: Cigarettes    Last attempt to quit: 09/18/2016    Years since quitting: 1.1  . Smokeless tobacco: Never Used  Substance and Sexual Activity  . Alcohol use: No    Alcohol/week: 0.0 oz  . Drug use: No  . Sexual activity: Not Currently  Other Topics Concern  . None  Social History Narrative  Married 1997. 2 sons 46 and 86 years old in 10/18. Wants to see children grow up.       Customer Service/accounts payable.    Went to school for medical assisting- has had a hard time finding work      Hobbies: [employer], enjoying [employer], getting out of home, wants to take [employer]     Family History:  The patient's family history includes Breast cancer in her unknown relative; Diabetes in her maternal grandmother; Heart attack in her paternal uncle; Heart disease in her father; Hypertension in her mother; Lung cancer in her mother.   ROS:   Please see the history of present illness.    Review of Systems  Constitution: Negative.  HENT: Negative.   Eyes: Negative.   Cardiovascular: Positive for chest pain.  Respiratory: Negative.   Hematologic/Lymphatic: Negative.   Musculoskeletal: Positive for back pain. Negative for joint pain.  Gastrointestinal: Negative.   Genitourinary: Negative.   Neurological: Positive for dizziness, headaches and loss of balance.   All other systems reviewed and are negative.   PHYSICAL EXAM:   VS:  BP 120/80   Pulse 81   Resp 16   Ht 5' 5.75" (1.67 m)   Wt 261 lb (118.4 kg)   SpO2 97%   BMI 42.45 kg/m   Physical Exam  GEN: Obese, in no acute distress  Neck: no JVD, carotid bruits, or masses Cardiac:RRR; distant heart  sounds no murmurs, rubs, or gallops  Respiratory: Decreased breath sounds with scattered wheezing GI: soft, nontender, nondistended, + BS Ext: without cyanosis, clubbing, or edema, Good distal pulses bilaterally Neuro:  Alert and Oriented x 3 Psych: euthymic mood, full affect  Wt Readings from Last 3 Encounters:  10/26/17 261 lb (118.4 kg)  10/19/17 262 lb 9.6 oz (119.1 kg)  10/15/17 250 lb (113.4 kg)      Studies/Labs Reviewed:   EKG:  EKG is not ordered today.  The ekg ordered today demonstrates normal sinus rhythm with no acute change  Recent Labs: 12/24/2016: Magnesium 2.0 04/22/2017: TSH 1.040 06/23/2017: ALT 18 10/15/2017: BUN 21; Creatinine, Ser 1.26; Hemoglobin 14.4; Platelets 232; Potassium 3.2; Sodium 137   Lipid Panel    Component Value Date/Time   CHOL 130 04/22/2017 1145   TRIG 141 04/22/2017 1145   HDL 25 (L) 04/22/2017 1145   CHOLHDL 6 05/26/2016 1134   VLDL 26.0 05/26/2016 1134   LDLCALC 77 04/22/2017 1145    Additional studies/ records that were reviewed today include:  2Decho 08/18/17 Study Conclusions   - Left ventricle: The cavity size was normal. Wall thickness was   increased in a pattern of mild LVH. Systolic function was normal.   The estimated ejection fraction was in the range of 60% to 65%.   Left ventricular diastolic function parameters were normal. - Left atrium: The atrium was mildly dilated. - Impressions: Suggest bubble study to r/o PFO if clinically   indicated.   Impressions:   - Suggest bubble study to r/o PFO if clinically indicated.   IMPRESSION: 1. No evidence of pulmonary embolus. 2. Diffuse hazy bilateral airspace opacities may reflect pulmonary edema or possibly infection. 3. Borderline prominent bilateral hilar nodes noted, measuring up to 1.2 cm in short axis. 4. Previously noted right middle lobe pulmonary nodule is relatively stable, measuring 5-6 mm.      ASSESSMENT:    1. DOE (dyspnea on exertion)   2. Chest  pain, unspecified type   3. Morbid obesity ([place])  4. Hyperlipidemia associated with type 2 diabetes mellitus ([place])   5. Hypertension associated with diabetes ([place])      PLAN:  In order of problems listed above:  Chest pain dyspnea on exertion.  Recently treated for pneumonia but symptoms continue.  Patient has history of normal cath in 2009- Myoview in 2015.  Symptoms seem to have progressed.  We will repeat Lexiscan Myoview.  Follow-up with Dr. Acie Fredrickson.  Morbid obesity weight loss essential  Hyperlipidemia continue Lipitor  Hypertension patient's blood pressure stable today    Medication Adjustments/Labs and Tests Ordered: Current medicines are reviewed at length with the patient today.  Concerns regarding medicines are outlined above.  Medication changes, Labs and Tests ordered today are listed in the Patient Instructions below. Patient Instructions  Medication Instructions:  1. Your physician recommends that you continue on your current medications as directed. Please refer to the Current Medication list given to you today.    Labwork: TODAY BMET  Testing/Procedures: Your physician has requested that you have a lexiscan myoview. For further information please visit [email or web address]. Please follow instruction sheet, as given. THIS WEEK PER Lisa [name], PAC     Follow-Up: KEEP YOUR APPT WITH DR. Cathie Olden NEXT WEEK  Any Other Special Instructions Will Be Listed Below (If Applicable).     If you need a refill on your cardiac medications before your next appointment, please call your pharmacy.      Sumner Boast, PA-C  10/26/2017 2:47 PM    [facility] [address], [place], [place]  [address] Phone: [phone]; Fax: [phone]

## 2017-10-27 ENCOUNTER — Encounter: Payer: Self-pay | Admitting: Emergency Medicine

## 2017-10-27 ENCOUNTER — Ambulatory Visit (INDEPENDENT_AMBULATORY_CARE_PROVIDER_SITE_OTHER): Payer: Managed Care, Other (non HMO) | Admitting: Emergency Medicine

## 2017-10-27 DIAGNOSIS — F17200 Nicotine dependence, unspecified, uncomplicated: Secondary | ICD-10-CM | POA: Diagnosis not present

## 2017-10-27 DIAGNOSIS — J84115 Respiratory bronchiolitis interstitial lung disease: Secondary | ICD-10-CM | POA: Diagnosis not present

## 2017-10-27 DIAGNOSIS — J449 Chronic obstructive pulmonary disease, unspecified: Secondary | ICD-10-CM | POA: Diagnosis not present

## 2017-10-27 DIAGNOSIS — R0789 Other chest pain: Secondary | ICD-10-CM | POA: Diagnosis not present

## 2017-10-27 DIAGNOSIS — R911 Solitary pulmonary nodule: Secondary | ICD-10-CM | POA: Diagnosis not present

## 2017-10-27 NOTE — Progress Notes (Signed)
Subjective:    Patient ID: Lisa [name], female    DOB: 1961-02-15, 56 y.o.   MRN: [id number]  Shortness of Breath  Pertinent negatives include no ear pain, fever, headaches, leg swelling, rash, rhinorrhea, sore throat, vomiting or wheezing.   ROV 12/17/15 -- hx of multifactorial dyspnea, tobacco abuse, obstructive sleep apnea on CPAP. 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  She tells me that she has increased her smoking to 2 pks a day. She doesn't have a primary MD, does follow with Dr Acie Fredrickson. She describes sx of depression. She had trouble when she took Zoloft, more depression. She tells me that her breathing has been stable. She denies any increase in wheeze or mucous. Not currently taking BD's.   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 This showed that her nodule is unchanged and based on usual criteria is benign. She had some improvement in her inflammatory changes although she does still have some infiltrate consistent with RB-ILD. She was able to decrease her cigarettes significantly for a period of time since our last visit. Currently smoking 15 cigarettes a day. She stopped wellbutrin about 3 weeks ago. She is having continued cough. She has some atypical chest discomfort - mid to left chest, bothers her after eating, sometimes w exertion. Sharp pain, has sometimes gone to her L arm. She is followed by Dr Acie Fredrickson and has had a reassuring eval thus far. She indicates that she is benefiting from CPAP.   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 She has a 5.45m RML nodule, most recent CT chest 06/2016. She had stopped smoking in 1-/17 to 12/17, currently using about 1/2 pack a day. She  is on wellbutrin. She is very reliable with her CPAP, benefits from it. Feels that her energy is better when she uses it. She is having ABI tomorrow for LE pain. She uses albuterol rarely.   ROV 07/30/17 -- follow-up visit for patient with a history of COPD and interstitial lung disease, mixed disease by spirometry. Her imaging has been most consistent with RB-ILD. Has been quite difficult for her to stop smoking. She still smokes approximately . A repeat CT scan of her chest was done on 07/15/17 that I have personally reviewed. This shows diffuse mosaic attenuation throughout both lungs consistent with a bronchiolitis. She has a stable 5 mm noncalcified right middle lobe nodule.  Her breathing is doing fairly well. She has a dry cough. She was on flonase, now off of it. Uses omeprazole qd  ROV 10/27/17 --patient is a [age]year old smoker with history of obstructive lung disease and diffuse interstitial lung disease by CT scan of the chest.  The imaging has been most consistent with RB-ILD although no biopsy has been performed.  She also has a noncalcified right middle lobe nodule which we have followed.  Her most recent scan from 07/15/17 showed diffuse scattered mosaic attenuation, probable bronchiolitis.  She was in the emergency department 10/15/17 for mid-CP for about 3-4 days; did not respond to Tums. Then she had acute SOB and pain when she laid flat the evening of admission. She improved but still has some fleeting,  migratory mid CP.  She is on omeprazole.   A CT scan of the chest was done on that date which I have reviewed.  This shows diffuse, multi lobar, hazy bilateral airspace opacities that are certainly more prominent than her prior scan.  Borderline prominent bilateral hilar nodes up to 1.2 cm and stable right middle lobe nodule.  She has decreased her cigarettes to 5 cigarettes daily.  She was treated empirically for PNA, although no fever, sputum, cough, etc.  We discussed options - I have  recommenced that we attempt to find a tissue dx, probably start with FOB. May need to consider VATS bx at some point. She is nervous and upset, tearful with discussing.        Review of Systems  Constitutional: Negative for fever and unexpected weight change.  HENT: Negative for congestion, dental problem, ear pain, nosebleeds, postnasal drip, rhinorrhea, sinus pressure, sneezing, sore throat and trouble swallowing.   Eyes: Negative for redness and itching.  Respiratory: Positive for cough. Negative for chest tightness, shortness of breath and wheezing.   Cardiovascular: Negative for palpitations and leg swelling.  Gastrointestinal: Negative for nausea and vomiting.  Genitourinary: Negative for dysuria.  Musculoskeletal: Negative for joint swelling.  Skin: Negative for rash.  Neurological: Negative for headaches.  Hematological: Does not bruise/bleed easily.  Psychiatric/Behavioral: Negative for dysphoric mood. The patient is not nervous/anxious.         Objective:   Physical Exam Vitals:   10/27/17 1335 10/27/17 1337  BP:  122/70  Pulse:  77  SpO2:  100%  Weight: 266 lb (120.7 kg)   Height: 5' 5.5" (1.664 m)    Gen: Pleasant, obese, in no distress,  normal affect  ENT: No lesions,  mouth clear, L ptosis  Neck: No JVD, no TMG, no carotid bruits  Lungs: No use of accessory muscles, clear without rales or rhonchi  Cardiovascular: RRR, heart sounds normal, no murmur or gallops, trace peripheral edema  Musculoskeletal: No deformities, no cyanosis or clubbing  Neuro: alert, non focal  Skin: Warm, no lesions or rashes       Assessment & Plan:  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  At this point I believe there is enough question regarding the actual diagnosis that we need to pursue more information.  I would start with bronchoscopy to see if BAL is consistent with RB ILD,  opportunistic infection.  She may ultimately require a VATS biopsy to give a definitive diagnosis.  I discussed this with her, also discussed the possible plan of empiric prednisone without a true diagnosis.  I recommended the bronchoscopy and she agrees.  We will try to arrange this on a Friday to allow it to be possible with her job.  Discussed smoking cessation in detail  COPD (chronic obstructive pulmonary disease) (HCC) Currently on albuterol as needed.  Consider repeat pulmonary function testing in the future.  Pulmonary nodule, right Stable by serial CT chest  Chest discomfort Sounds atypical in nature but she is following with cardiology and is planning to undergo a cardiac stress test.  Tobacco use disorder Discussed cessation with her today.  Stressed the importance.  She wants to get it done, is under a lot of stress, was tearful during our discussion.  Baltazar Apo, MD, PhD 10/27/2017, 2:02 PM [facility] [phone] or if no answer [phone]

## 2017-10-27 NOTE — Assessment & Plan Note (Signed)
Stable by serial CT chest

## 2017-10-27 NOTE — Assessment & Plan Note (Signed)
Sounds atypical in nature but she is following with cardiology and is planning to undergo a cardiac stress test.

## 2017-10-27 NOTE — Assessment & Plan Note (Signed)
Progressive infiltrates on a CT scan chest from 10/15/17.  At this point I believe there is enough question regarding the actual diagnosis that we need to pursue more information.  I would start with bronchoscopy to see if BAL is consistent with RB ILD, opportunistic infection.  She may ultimately require a VATS biopsy to give a definitive diagnosis.  I discussed this with her, also discussed the possible plan of empiric prednisone without a true diagnosis.  I recommended the bronchoscopy and she agrees.  We will try to arrange this on a Friday to allow it to be possible with her job.  Discussed smoking cessation in detail

## 2017-10-27 NOTE — H&P (View-Only) (Signed)
Subjective:    Patient ID: Lisa [name], female    DOB: 1961/08/15, 56 y.o.   MRN: [id number]  Shortness of Breath  Pertinent negatives include no ear pain, fever, headaches, leg swelling, rash, rhinorrhea, sore throat, vomiting or wheezing.   ROV 12/17/15 -- hx of multifactorial dyspnea, tobacco abuse, obstructive sleep apnea on CPAP. 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  She tells me that she has increased her smoking to 2 pks a day. She doesn't have a primary MD, does follow with Dr Acie Fredrickson. She describes sx of depression. She had trouble when she took Zoloft, more depression. She tells me that her breathing has been stable. She denies any increase in wheeze or mucous. Not currently taking BD's.   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 This showed that her nodule is unchanged and based on usual criteria is benign. She had some improvement in her inflammatory changes although she does still have some infiltrate consistent with RB-ILD. She was able to decrease her cigarettes significantly for a period of time since our last visit. Currently smoking 15 cigarettes a day. She stopped wellbutrin about 3 weeks ago. She is having continued cough. She has some atypical chest discomfort - mid to left chest, bothers her after eating, sometimes w exertion. Sharp pain, has sometimes gone to her L arm. She is followed by Dr Acie Fredrickson and has had a reassuring eval thus far. She indicates that she is benefiting from CPAP.   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 She has a 5.15m RML nodule, most recent CT chest 06/2016. She had stopped smoking in 1-/17 to 12/17, currently using about 1/2 pack a day. She  is on wellbutrin. She is very reliable with her CPAP, benefits from it. Feels that her energy is better when she uses it. She is having ABI tomorrow for LE pain. She uses albuterol rarely.   ROV 07/30/17 -- follow-up visit for patient with a history of COPD and interstitial lung disease, mixed disease by spirometry. Her imaging has been most consistent with RB-ILD. Has been quite difficult for her to stop smoking. She still smokes approximately . A repeat CT scan of her chest was done on 07/15/17 that I have personally reviewed. This shows diffuse mosaic attenuation throughout both lungs consistent with a bronchiolitis. She has a stable 5 mm noncalcified right middle lobe nodule.  Her breathing is doing fairly well. She has a dry cough. She was on flonase, now off of it. Uses omeprazole qd  ROV 10/27/17 --patient is a 56year old smoker with history of obstructive lung disease and diffuse interstitial lung disease by CT scan of the chest.  The imaging has been most consistent with RB-ILD although no biopsy has been performed.  She also has a noncalcified right middle lobe nodule which we have followed.  Her most recent scan from 07/15/17 showed diffuse scattered mosaic attenuation, probable bronchiolitis.  She was in the emergency department 10/15/17 for mid-CP for about 3-4 days; did not respond to Tums. Then she had acute SOB and pain when she laid flat the evening of admission. She improved but still has some fleeting,  migratory mid CP.  She is on omeprazole.   A CT scan of the chest was done on that date which I have reviewed.  This shows diffuse, multi lobar, hazy bilateral airspace opacities that are certainly more prominent than her prior scan.  Borderline prominent bilateral hilar nodes up to 1.2 cm and stable right middle lobe nodule.  She has decreased her cigarettes to 5 cigarettes daily.  She was treated empirically for PNA, although no fever, sputum, cough, etc.  We discussed options - I have  recommenced that we attempt to find a tissue dx, probably start with FOB. May need to consider VATS bx at some point. She is nervous and upset, tearful with discussing.        Review of Systems  Constitutional: Negative for fever and unexpected weight change.  HENT: Negative for congestion, dental problem, ear pain, nosebleeds, postnasal drip, rhinorrhea, sinus pressure, sneezing, sore throat and trouble swallowing.   Eyes: Negative for redness and itching.  Respiratory: Positive for cough. Negative for chest tightness, shortness of breath and wheezing.   Cardiovascular: Negative for palpitations and leg swelling.  Gastrointestinal: Negative for nausea and vomiting.  Genitourinary: Negative for dysuria.  Musculoskeletal: Negative for joint swelling.  Skin: Negative for rash.  Neurological: Negative for headaches.  Hematological: Does not bruise/bleed easily.  Psychiatric/Behavioral: Negative for dysphoric mood. The patient is not nervous/anxious.         Objective:   Physical Exam Vitals:   10/27/17 1335 10/27/17 1337  BP:  122/70  Pulse:  77  SpO2:  100%  Weight: 266 lb (120.7 kg)   Height: 5' 5.5" (1.664 m)    Gen: Pleasant, obese, in no distress,  normal affect  ENT: No lesions,  mouth clear, L ptosis  Neck: No JVD, no TMG, no carotid bruits  Lungs: No use of accessory muscles, clear without rales or rhonchi  Cardiovascular: RRR, heart sounds normal, no murmur or gallops, trace peripheral edema  Musculoskeletal: No deformities, no cyanosis or clubbing  Neuro: alert, non focal  Skin: Warm, no lesions or rashes       Assessment & Plan:  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  At this point I believe there is enough question regarding the actual diagnosis that we need to pursue more information.  I would start with bronchoscopy to see if BAL is consistent with RB ILD,  opportunistic infection.  She may ultimately require a VATS biopsy to give a definitive diagnosis.  I discussed this with her, also discussed the possible plan of empiric prednisone without a true diagnosis.  I recommended the bronchoscopy and she agrees.  We will try to arrange this on a Friday to allow it to be possible with her job.  Discussed smoking cessation in detail  COPD (chronic obstructive pulmonary disease) (HCC) Currently on albuterol as needed.  Consider repeat pulmonary function testing in the future.  Pulmonary nodule, right Stable by serial CT chest  Chest discomfort Sounds atypical in nature but she is following with cardiology and is planning to undergo a cardiac stress test.  Tobacco use disorder Discussed cessation with her today.  Stressed the importance.  She wants to get it done, is under a lot of stress, was tearful during our discussion.  Baltazar Apo, MD, PhD 10/27/2017, 2:02 PM [facility] [phone] or if no answer [phone]

## 2017-10-27 NOTE — Assessment & Plan Note (Signed)
Currently on albuterol as needed.  Consider repeat pulmonary function testing in the future.

## 2017-10-27 NOTE — Assessment & Plan Note (Signed)
Discussed cessation with her today.  Stressed the importance.  She wants to get it done, is under a lot of stress, was tearful during our discussion.

## 2017-10-27 NOTE — Patient Instructions (Addendum)
We will work on scheduling a bronchoscopy to further evaluate your interstitial changes on CT scan Take albuterol 2 puffs up to every 4 hours if needed for shortness of breath You need to continue to work hard to try and decrease her smoking.  Our goal will be to stop completely as soon as possible. Follow with Dr Lamonte Sakai in 1 month

## 2017-10-28 ENCOUNTER — Ambulatory Visit: Payer: Managed Care, Other (non HMO) | Admitting: Sports Medicine

## 2017-10-29 ENCOUNTER — Telehealth: Payer: Self-pay | Admitting: Emergency Medicine

## 2017-10-29 NOTE — Addendum Note (Signed)
Addended by: Lianne Cure A on: 10/29/2017 02:21 PM   Modules accepted: Orders

## 2017-10-29 NOTE — Telephone Encounter (Signed)
Spoke with pt, she states she would want the procedure done Wednesday or Thursday if possible. Lisa [name]

## 2017-11-01 ENCOUNTER — Ambulatory Visit (HOSPITAL_COMMUNITY): Payer: Managed Care, Other (non HMO)

## 2017-11-01 ENCOUNTER — Other Ambulatory Visit: Payer: Self-pay | Admitting: Cardiovascular Disease

## 2017-11-02 ENCOUNTER — Ambulatory Visit (HOSPITAL_COMMUNITY): Payer: Managed Care, Other (non HMO)

## 2017-11-02 NOTE — Telephone Encounter (Signed)
I already told the front desk I dont schedule the bronch just ebus and enb you will have to ask dr B about this

## 2017-11-02 NOTE — Telephone Encounter (Signed)
[facility] can you help me with this order?   Thanks

## 2017-11-02 NOTE — Telephone Encounter (Signed)
Pt calling back scheduling of the bronoscopy and she needs to teel her new job as soon as possible.  Please leave her and message and she will call back during her break.-tr

## 2017-11-02 NOTE — Telephone Encounter (Signed)
Pt needs to be scheduled for a bronchoscopy. Triage can you please assist with scheduling this?

## 2017-11-02 NOTE — Telephone Encounter (Signed)
I have called respiratory and spoke with Baxter Flattery. Pt has scheduled for the bronch. Bronch has been scheduled for 11/09/17 at 7:30am at [facility]. Pt is aware of this information. I will verbally tell RB about this. Nothing further was needed.

## 2017-11-02 NOTE — Telephone Encounter (Signed)
RB when would you like to do the bronch for this pt? I will forward to [place] as well.

## 2017-11-03 ENCOUNTER — Ambulatory Visit: Payer: Managed Care, Other (non HMO) | Admitting: Cardiovascular Disease

## 2017-11-09 ENCOUNTER — Inpatient Hospital Stay (HOSPITAL_COMMUNITY)
Admission: RE | Admit: 2017-11-09 | Discharge: 2017-11-09 | Disposition: A | Discharge status: Home / Self Care | Payer: Managed Care, Other (non HMO) | Source: Ambulatory Visit

## 2017-11-09 ENCOUNTER — Encounter (HOSPITAL_COMMUNITY): Admission: RE | Payer: Self-pay | Source: Ambulatory Visit

## 2017-11-09 ENCOUNTER — Ambulatory Visit (HOSPITAL_COMMUNITY)
Admission: RE | Admit: 2017-11-09 | Payer: Managed Care, Other (non HMO) | Source: Ambulatory Visit | Admitting: Emergency Medicine

## 2017-11-09 SURGERY — BRONCHOSCOPY, WITH FLUOROSCOPY
Anesthesia: Moderate Sedation | Laterality: Bilateral

## 2017-11-10 ENCOUNTER — Telehealth: Payer: Self-pay | Admitting: Emergency Medicine

## 2017-11-10 NOTE — Telephone Encounter (Signed)
Lisa [name] calling back saying that pt is able to come on Monday for her bronch.  Call back [phone]

## 2017-11-10 NOTE — Telephone Encounter (Signed)
Noted  

## 2017-11-11 ENCOUNTER — Ambulatory Visit (INDEPENDENT_AMBULATORY_CARE_PROVIDER_SITE_OTHER)
Admission: RE | Admit: 2017-11-11 | Discharge: 2017-11-11 | Disposition: A | Discharge status: Home / Self Care | Payer: Managed Care, Other (non HMO) | Source: Ambulatory Visit | Attending: Family | Admitting: Family

## 2017-11-11 ENCOUNTER — Encounter: Payer: Self-pay | Admitting: Family

## 2017-11-11 ENCOUNTER — Ambulatory Visit (HOSPITAL_COMMUNITY)
Admission: RE | Admit: 2017-11-11 | Payer: Managed Care, Other (non HMO) | Source: Ambulatory Visit | Admitting: Emergency Medicine

## 2017-11-11 ENCOUNTER — Ambulatory Visit (HOSPITAL_COMMUNITY)
Admission: RE | Admit: 2017-11-11 | Discharge: 2017-11-11 | Disposition: A | Discharge status: Home / Self Care | Payer: Managed Care, Other (non HMO) | Source: Ambulatory Visit | Attending: Family | Admitting: Family

## 2017-11-11 ENCOUNTER — Ambulatory Visit (INDEPENDENT_AMBULATORY_CARE_PROVIDER_SITE_OTHER): Payer: Managed Care, Other (non HMO) | Admitting: Family

## 2017-11-11 VITALS — BP 130/70 | HR 68 | Temp 98.7°F | Resp 18 | Wt 261.9 lb

## 2017-11-11 DIAGNOSIS — I739 Peripheral vascular disease, unspecified: Secondary | ICD-10-CM

## 2017-11-11 DIAGNOSIS — Z9862 Peripheral vascular angioplasty status: Secondary | ICD-10-CM | POA: Diagnosis not present

## 2017-11-11 LAB — VAS US LOWER EXTREMITY ARTERIAL DUPLEX
LEFT PERO DIST SYS: -32 cm/s
LSFMPSV: -154 cm/s
Left super femoral dist sys PSV: 52 cm/s
Left super femoral prox sys PSV: -217 cm/s
left post tibial dist sys: -41 cm/s

## 2017-11-11 NOTE — Progress Notes (Signed)
[facility] [place]   CC: Follow up peripheral artery occlusive disease  History of Present Illness Lisa [name] is a 56 y.o. female returns for follow-up today for peripheral arterial disease. She is s/p angioplasty of a left superficial femoral artery stenosis on 04-30-17 by Dr. Oneida Alar for claudication symptoms. She states her left leg is much improved. She still complains of some pain in her back with ambulation. She has known lumbar spine disc disease. She took Plavix until September 2018 as advised.   Review of systems: She has shortness of breath with exertion. She denies chest pain. She is scheduled for a bronchoscopy on 11-15-17 to evaluate a nodule known since 2016.   Dr. Oneida Alar last evaluated pt on 08-12-17. At that time duplex ultrasound of her left lower extremity showed a widely patent left superficial femoral artery. ABI on the left was 0.94 right was 0.91 The patient currently is on aspirin. She is wishing to start back on ibuprofen because of her degenerative arthritis problems. She was to follow-up in 3 months for repeat duplex of her left leg as well as bilateral ABIs. She'll see our nurse practitioner that office visit.  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 She has been walking a great deal at her current job.  She denies any history of stroke or MI.  She had an event in which she briefly could not speak, was evaluated, and states she was told that she likely had a migraine with no pain and no aura.    Diabetic: Yes, last A1C was 6.3 on 07-30-17 Tobacco use: smoker  (1 ppd, decreased from 2-3 ppd, started in late teens)  Pt meds include: Statin :Yes Betablocker: yes ASA: Yes, but stopped due to upcoming bronchoscopy at her pulmonologist advice   Other anticoagulants/antiplatelets: no  Past Medical History:  Diagnosis Date  . Allergic rhinitis   . Asthma   . Breast nodule    right breast, to see dr Helane Rima  06-20-2012 for   . Chest pain, non-cardiac   . Chronic headaches   . Cigarette smoker   . Complication of anesthesia 05-17-2012   trouble breathing after colonscopy, needed nebulizer  . Diverticulosis   . GERD (gastroesophageal reflux disease)   . Hepatic hemangioma   . Hypertension   . IBS (irritable bowel syndrome)   . Obesity   . Prosthetic eye globe   . Pyloric erosion   . Sleep apnea    CPAP setting varies from 4-10  . Type 2 diabetes mellitus ([place]) 03/11/2017    Social History Social History   Tobacco Use  . Smoking status: Current Every Day Smoker    Packs/day: 1.00    Years: 25.00    Pack years: 25.00    Types: Cigarettes  . Smokeless tobacco: Never Used  Substance Use Topics  . Alcohol use: No    Alcohol/week: 0.0 oz  . Drug use: No    Family History Family History  Problem Relation Age of Onset  . Hypertension Mother   . Lung cancer Mother        67  . Diabetes Maternal Grandmother   . Heart disease Father        pacemaker or defibrillator  . Heart attack Paternal Uncle   . Breast cancer Unknown   . Colon cancer Neg Hx     Past Surgical History:  Procedure Laterality Date  . ABDOMINAL AORTOGRAM W/LOWER EXTREMITY N/A 04/30/2017  Procedure: Abdominal Aortogram w/Lower Extremity;  Surgeon: Elam Dutch, MD;  Location: [facility];  Service: Cardiovascular;  Laterality: N/A;  . CARDIAC CATHETERIZATION  12/28/2007   EF 75%. IT REVEALS NORMAL/SUPRANORMAL LEFT VENTRICULAR SYSTOLIC FUNCTION  . CARDIOVASCULAR STRESS TEST  03/25/2007   EF 78%  . CESAREAN SECTION  2003, 2009   x 2  . CHOLECYSTECTOMY  1993  . COLONOSCOPY  2014  . ENDOMETRIAL ABLATION  09/2010   and D&C  . HERNIA REPAIR  2633   umbilical  . LAPAROSCOPY    . PERIPHERAL VASCULAR BALLOON ANGIOPLASTY Left 04/30/2017   Procedure: Peripheral Vascular Balloon Angioplasty;  Surgeon: Elam Dutch, MD;  Location: [place] [facility];  Service: Cardiovascular;  Laterality: Left;  SFA  .  TUBAL LIGATION    . UMBILICAL HERNIA REPAIR  2004  . US ECHOCARDIOGRAPHY  05/08/2008   EF 55-60%  . VENTRAL HERNIA REPAIR  06/15/2012   Procedure: LAPAROSCOPIC VENTRAL HERNIA;  Surgeon: Edward Jolly, MD;  Location: [facility];  Service: General;  Laterality: N/A;  LAPAROSCOPIC REPAIR VENTRAL HERNIA    Allergies  Allergen Reactions  . Almond Oil Anaphylaxis and Nausea And Vomiting    Almond-Nuts Only  . Apple Anaphylaxis  . Ceftin [Cefuroxime] Other (See Comments)    Severe stomach pain - Diverticulitis flares up  . Cherry Extract Anaphylaxis    Cherry fruit only   . Other Anaphylaxis and Itching    Most fruits- Are NOT tolerated (PLEASE ASK PATIENT BEFORE GIVING, AS CERTAIN FRUITS ARE TOLERATED IN LIMITED QUANTITIES!!)  . Guaifenesin & Derivatives Other (See Comments)    Restlessness, jittery  . Hctz [Hydrochlorothiazide] Other (See Comments)    Causes a headache  . Morphine And Related Other (See Comments)    Moodiness.  Causes anger.  . Naproxen Other (See Comments)    Insomnia  . Lisinopril Cough         Current Outpatient Medications  Medication Sig Dispense Refill  . albuterol (PROVENTIL HFA;VENTOLIN HFA) 108 (90 Base) MCG/ACT inhaler Inhale 2 puffs into the lungs every 6 (six) hours as needed for wheezing or shortness of breath. 1 Inhaler 3  . atorvastatin (LIPITOR) 40 MG tablet Take 1 tablet (40 mg total) by mouth daily. 90 tablet 3  . Blood Glucose Monitoring Suppl (ONE TOUCH ULTRA MINI) w/Device KIT Use meter to check blood sugars 1-4 times daily as instructed. 1 each 0  . buPROPion (WELLBUTRIN SR) 150 MG 12 hr tablet Take 150 mg by mouth in the morning  0  . carvedilol (COREG) 25 MG tablet TAKE 1 TABLET (25 MG TOTAL) BY MOUTH 2 (TWO) TIMES DAILY. 180 tablet 3  . dicyclomine (BENTYL) 10 MG capsule TAKE 1 CAPSULE (10 MG TOTAL) BY MOUTH 3 (THREE) TIMES DAILY AS NEEDED FOR SPASMS. (Patient taking differently: Take 10 mg daily by mouth. ) 90 capsule 3  . doxazosin  (CARDURA) 2 MG tablet TAKE 1 TABLET (2 MG TOTAL) BY MOUTH 2 (TWO) TIMES DAILY. 180 tablet 0  . glucose blood (ONE TOUCH ULTRA TEST) test strip 1 each by Other route as directed. Use one strip per test. Test blood sugars 1-4 times daily as instructed. 100 each 3  . linaclotide (LINZESS) 145 MCG CAPS capsule Take 145 mcg by mouth daily before breakfast.    . liraglutide 18 MG/3ML SOPN Inject 0.2 mLs (1.2 mg total) into the skin every morning. 2 pen 0  . losartan (COZAAR) 100 MG tablet TAKE 1 TABLET (  100 MG TOTAL) BY MOUTH DAILY. 90 tablet 1  . Multiple Vitamin (MULTIVITAMIN WITH MINERALS) TABS tablet Take 1 tablet by mouth daily.    Marland Kitchen omeprazole (PRILOSEC) 40 MG capsule TAKE 1 CAPSULE (40 MG TOTAL) BY MOUTH 2 (TWO) TIMES DAILY. (Patient taking differently: Take 40 mg daily by mouth. ) 180 capsule 0  . ONE TOUCH LANCETS MISC Use to check blood sugars twice a day 200 each 0  . polyethylene glycol (MIRALAX / GLYCOLAX) packet Take 17 g by mouth daily. 14 each 0  . senna (SENOKOT) 8.6 MG TABS tablet Take 1 tablet (8.6 mg total) by mouth daily as needed for mild constipation. 120 each 0  . spironolactone (ALDACTONE) 50 MG tablet TAKE 1 TABLET (50 MG TOTAL) BY MOUTH DAILY. 90 tablet 3  . aspirin 325 MG EC tablet Take 1 tablet (325 mg total) by mouth daily. (Patient not taking: Reported on 11/11/2017) 30 tablet 10   No current facility-administered medications for this visit.     ROS: See HPI for pertinent positives and negatives.   Physical Examination  Vitals:   11/11/17 1234 11/11/17 1241  BP: 130/80 130/70  Pulse: 68   Resp: 18   Temp: 98.7 F (37.1 C)   TempSrc: Oral   SpO2: 99%   Weight: 261 lb 14.4 oz (118.8 kg)    Body mass index is 42.92 kg/m.  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  Neck: large neck, Acanthosis nigricans encircling neck Pulmonary:  Respirations are non labored, CTAB, distant breath sounds in all fields Cardiac: regular rhythm and rate, no detected murmur.         Carotid Bruits Right Left   Negative Negative   Radial pulses are 2+ palpable bilaterally   Adominal aortic pulse is not palpable                         VASCULAR EXAM: Extremities without ischemic changes, without Gangrene; without open wounds. Long toenails, a few are thick                                                                                                          LE Pulses Right Left       FEMORAL  not palpable (large panus)  1+ palpable        POPLITEAL  not palpable   not palpable       POSTERIOR TIBIAL  not palpable   2+ palpable        DORSALIS PEDIS      ANTERIOR TIBIAL 2+ palpable  2+ palpable    Abdomen: soft, NT, no palpable masses, large panus. Skin: no rashes, no ulcers noted. Musculoskeletal: no muscle wasting or atrophy.  Neurologic: A&O X 3; appropriate affect; SENSATION: normal; MOTOR FUNCTION: moving all extremities equally, motor strength 5/5 throughout. Speech is fluent/normal. CN 2-12 intact.    ASSESSMENT: Lisa [name] is a 56 y.o.  female who is s/p angioplasty of a left superficial femoral artery stenosis on 04-30-17. She no longer has claudication symptoms with walking, but her walking seems limited by painful hip and back problems, she states she has arthritis.   There are no signs of ischemia in her feet or legs.   Her atherosclerotic risk factors include heavy smoking to present (started in her teens), currently controlled DM, morbid obesity (states she has lost weight recently), and obstructive sleep apnea (uses CPAP).  DATA  Left LE Arterial Duplex (11/11/17): Mixed plaque visible in the left CFA and proximal SFA. Velocity in the proximal SFA doubles (181/389 cm/s) (was 153 cm/s) (with visualized plaque) suggesting 50-74% stenosis.  Increased velocity in the left proximal SFA since the prior exam  on 08-12-17.   ABI (Date: 11/11/2017):  R:   ABI: 1.07 (was 0.91 on 08-12-17),   PT: tri  DP: tri  TBI:  0.81 (was 0.75)  L:   ABI: 1.04 (was 0.94),   PT: tri  DP: tri  TBI: 0.73 (was 0.86)  Improved and normal bilateral ABI with all triphasic waveforms.     PLAN:  Based on the patient's vascular studies and examination, pt will return to clinic in 3 months with left LE arterial duplex and ABI's.  I advised her to notify us if she develops concerns re the circulation in her feet or legs.   I discussed in depth with the patient the nature of atherosclerosis, and emphasized the importance of maximal medical management including strict control of blood pressure, blood glucose, and lipid levels, obtaining regular exercise, and cessation of smoking.  The patient is aware that without maximal medical management the underlying atherosclerotic disease process will progress, limiting the benefit of any interventions.  The patient was given information about PAD including signs, symptoms, treatment, what symptoms should prompt the patient to seek immediate medical care, and risk reduction measures to take.  Clemon Chambers, RN, MSN, FNP-C [facility] Phone: [phone]  Clinic MD: [facility]  11/11/17 12:53 PM

## 2017-11-11 NOTE — Patient Instructions (Signed)
Steps to Quit Smoking Smoking tobacco can be bad for your health. It can also affect almost every organ in your body. Smoking puts you and people around you at risk for many serious long-lasting (chronic) diseases. Quitting smoking is hard, but it is one of the best things that you can do for your health. It is never too late to quit. What are the benefits of quitting smoking? When you quit smoking, you lower your risk for getting serious diseases and conditions. They can include:  Lung cancer or lung disease.  Heart disease.  Stroke.  Heart attack.  Not being able to have children (infertility).  Weak bones (osteoporosis) and broken bones (fractures).  If you have coughing, wheezing, and shortness of breath, those symptoms may get better when you quit. You may also get sick less often. If you are pregnant, quitting smoking can help to lower your chances of having a baby of low birth weight. What can I do to help me quit smoking? Talk with your doctor about what can help you quit smoking. Some things you can do (strategies) include:  Quitting smoking totally, instead of slowly cutting back how much you smoke over a period of time.  Going to in-person counseling. You are more likely to quit if you go to many counseling sessions.  Using resources and support systems, such as: ? Online chats with a counselor. ? Phone quitlines. ? Printed self-help materials. ? Support groups or group counseling. ? Text messaging programs. ? Mobile phone apps or applications.  Taking medicines. Some of these medicines may have nicotine in them. If you are pregnant or breastfeeding, do not take any medicines to quit smoking unless your doctor says it is okay. Talk with your doctor about counseling or other things that can help you.  Talk with your doctor about using more than one strategy at the same time, such as taking medicines while you are also going to in-person counseling. This can help make  quitting easier. What things can I do to make it easier to quit? Quitting smoking might feel very hard at first, but there is a lot that you can do to make it easier. Take these steps:  Talk to your family and friends. Ask them to support and encourage you.  Call phone quitlines, reach out to support groups, or work with a counselor.  Ask people who smoke to not smoke around you.  Avoid places that make you want (trigger) to smoke, such as: ? Bars. ? Parties. ? Smoke-break areas at work.  Spend time with people who do not smoke.  Lower the stress in your life. Stress can make you want to smoke. Try these things to help your stress: ? Getting regular exercise. ? Deep-breathing exercises. ? Yoga. ? Meditating. ? Doing a body scan. To do this, close your eyes, focus on one area of your body at a time from head to toe, and notice which parts of your body are tense. Try to relax the muscles in those areas.  Download or buy apps on your mobile phone or tablet that can help you stick to your quit plan. There are many free apps, such as QuitGuide from the CDC (Centers for Disease Control and Prevention). You can find more support from smokefree.gov and other websites.  This information is not intended to replace advice given to you by your health care provider. Make sure you discuss any questions you have with your health care provider. Document Released: 09/12/2009 Document   Revised: 07/14/2016 Document Reviewed: 04/02/2015 Elsevier Interactive Patient Education  2018 [employer]     Peripheral Vascular Disease Peripheral vascular disease (PVD) is a disease of the blood vessels that are not part of your heart and brain. A simple term for PVD is poor circulation. In most cases, PVD narrows the blood vessels that carry blood from your heart to the rest of your body. This can result in a decreased supply of blood to your arms, legs, and internal organs, like your stomach or kidneys.  However, it most often affects a person's lower legs and feet. There are two types of PVD.  Organic PVD. This is the more common type. It is caused by damage to the structure of blood vessels.  Functional PVD. This is caused by conditions that make blood vessels contract and tighten (spasm).  Without treatment, PVD tends to get worse over time. PVD can also lead to acute ischemic limb. This is when an arm or limb suddenly has trouble getting enough blood. This is a medical emergency. Follow these instructions at home:  Take medicines only as told by your doctor.  Do not use any tobacco products, including cigarettes, chewing tobacco, or electronic cigarettes. If you need help quitting, ask your doctor.  Lose weight if you are overweight, and maintain a healthy weight as told by your doctor.  Eat a diet that is low in fat and cholesterol. If you need help, ask your doctor.  Exercise regularly. Ask your doctor for some good activities for you.  Take good care of your feet. ? Wear comfortable shoes that fit well. ? Check your feet often for any cuts or sores. Contact a doctor if:  You have cramps in your legs while walking.  You have leg pain when you are at rest.  You have coldness in a leg or foot.  Your skin changes.  You are unable to get or have an erection (erectile dysfunction).  You have cuts or sores on your feet that are not healing. Get help right away if:  Your arm or leg turns cold and blue.  Your arms or legs become red, warm, swollen, painful, or numb.  You have chest pain or trouble breathing.  You suddenly have weakness in your face, arm, or leg.  You become very confused or you cannot speak.  You suddenly have a very bad headache.  You suddenly cannot see. This information is not intended to replace advice given to you by your health care provider. Make sure you discuss any questions you have with your health care provider. Document Released:  02/10/2010 Document Revised: 04/23/2016 Document Reviewed: 04/26/2014 Elsevier Interactive Patient Education  2017 Elsevier Inc.  

## 2017-11-12 NOTE — Addendum Note (Signed)
Addended by: Lianne Cure A on: 11/12/2017 10:07 AM   Modules accepted: Orders

## 2017-11-15 ENCOUNTER — Ambulatory Visit (HOSPITAL_COMMUNITY): Payer: Managed Care, Other (non HMO)

## 2017-11-15 ENCOUNTER — Encounter (HOSPITAL_COMMUNITY): Payer: Self-pay | Admitting: Respiratory Therapy

## 2017-11-15 ENCOUNTER — Encounter (HOSPITAL_COMMUNITY)
Admission: RE | Disposition: A | Discharge status: Home / Self Care | Payer: Self-pay | Source: Ambulatory Visit | Attending: Emergency Medicine

## 2017-11-15 ENCOUNTER — Ambulatory Visit (HOSPITAL_COMMUNITY)
Admission: RE | Admit: 2017-11-15 | Discharge: 2017-11-15 | Disposition: A | Discharge status: Home / Self Care | Payer: Managed Care, Other (non HMO) | Source: Ambulatory Visit | Attending: Emergency Medicine | Admitting: Emergency Medicine

## 2017-11-15 DIAGNOSIS — F17210 Nicotine dependence, cigarettes, uncomplicated: Secondary | ICD-10-CM | POA: Diagnosis not present

## 2017-11-15 DIAGNOSIS — J449 Chronic obstructive pulmonary disease, unspecified: Secondary | ICD-10-CM | POA: Diagnosis not present

## 2017-11-15 DIAGNOSIS — F329 Major depressive disorder, single episode, unspecified: Secondary | ICD-10-CM | POA: Insufficient documentation

## 2017-11-15 DIAGNOSIS — R918 Other nonspecific abnormal finding of lung field: Secondary | ICD-10-CM | POA: Diagnosis not present

## 2017-11-15 DIAGNOSIS — K219 Gastro-esophageal reflux disease without esophagitis: Secondary | ICD-10-CM | POA: Diagnosis not present

## 2017-11-15 DIAGNOSIS — E669 Obesity, unspecified: Secondary | ICD-10-CM | POA: Insufficient documentation

## 2017-11-15 DIAGNOSIS — G4733 Obstructive sleep apnea (adult) (pediatric): Secondary | ICD-10-CM | POA: Insufficient documentation

## 2017-11-15 DIAGNOSIS — J84115 Respiratory bronchiolitis interstitial lung disease: Secondary | ICD-10-CM

## 2017-11-15 DIAGNOSIS — Z9889 Other specified postprocedural states: Secondary | ICD-10-CM | POA: Diagnosis not present

## 2017-11-15 HISTORY — PX: VIDEO BRONCHOSCOPY: SHX5072

## 2017-11-15 LAB — BODY FLUID CELL COUNT WITH DIFFERENTIAL
Eos, Fluid: 1 %
Lymphs, Fluid: 9 %
Monocyte-Macrophage-Serous Fluid: 74 % (ref 50–90)
Neutrophil Count, Fluid: 16 % (ref 0–25)
WBC FLUID: 50 uL (ref 0–1000)

## 2017-11-15 LAB — GLUCOSE, CAPILLARY: GLUCOSE-CAPILLARY: 83 mg/dL (ref 65–99)

## 2017-11-15 SURGERY — BRONCHOSCOPY, WITH FLUOROSCOPY
Anesthesia: Moderate Sedation | Laterality: Bilateral

## 2017-11-15 MED ORDER — OMEPRAZOLE 40 MG PO CPDR
40.0000 mg | DELAYED_RELEASE_CAPSULE | Freq: Every day | ORAL | Status: DC
Start: 2017-11-15 — End: 2018-07-28

## 2017-11-15 MED ORDER — LIDOCAINE HCL 2 % EX GEL: 1.0000 "application " | Freq: Once | CUTANEOUS | Status: DC

## 2017-11-15 MED ORDER — SODIUM CHLORIDE 0.9 % IV SOLN
INTRAVENOUS | Status: DC
  Administered 2017-11-15: 11:00:00 via INTRAVENOUS

## 2017-11-15 MED ORDER — PHENYLEPHRINE HCL 0.25 % NA SOLN
NASAL | Status: DC | PRN
  Administered 2017-11-15: 2 via NASAL

## 2017-11-15 MED ORDER — LIDOCAINE HCL 1 % IJ SOLN
INTRAMUSCULAR | Status: DC | PRN
  Administered 2017-11-15: 6 mL via RESPIRATORY_TRACT

## 2017-11-15 MED ORDER — MIDAZOLAM HCL 5 MG/ML IJ SOLN
INTRAMUSCULAR | Status: AC
  Filled 2017-11-15: qty 2

## 2017-11-15 MED ORDER — FENTANYL CITRATE (PF) 100 MCG/2ML IJ SOLN
INTRAMUSCULAR | Status: DC | PRN
  Administered 2017-11-15 (×3): 25 ug via INTRAVENOUS
  Administered 2017-11-15: 50 ug via INTRAVENOUS

## 2017-11-15 MED ORDER — MIDAZOLAM HCL 10 MG/2ML IJ SOLN
INTRAMUSCULAR | Status: DC | PRN
  Administered 2017-11-15: 2 mg via INTRAVENOUS
  Administered 2017-11-15 (×4): 1 mg via INTRAVENOUS

## 2017-11-15 MED ORDER — FENTANYL CITRATE (PF) 100 MCG/2ML IJ SOLN
INTRAMUSCULAR | Status: AC
  Filled 2017-11-15: qty 4

## 2017-11-15 MED ORDER — DICYCLOMINE HCL 10 MG PO CAPS: 10.0000 mg | ORAL_CAPSULE | Freq: Every day | ORAL | Status: DC

## 2017-11-15 MED ORDER — LIDOCAINE HCL 2 % EX GEL
CUTANEOUS | Status: DC | PRN
  Administered 2017-11-15: 1

## 2017-11-15 MED ORDER — PHENYLEPHRINE HCL 0.25 % NA SOLN: 1.0000 | Freq: Four times a day (QID) | NASAL | Status: DC | PRN

## 2017-11-15 NOTE — Discharge Instructions (Signed)
Flexible Bronchoscopy, Care After These instructions give you information on caring for yourself after your procedure. Your doctor may also give you more specific instructions. Call your doctor if you have any problems or questions after your procedure. Follow these instructions at home:  Do not eat or drink anything for 2 hours after your procedure. If you try to eat or drink before the medicine wears off, food or drink could go into your lungs. You could also burn yourself.  After 2 hours have passed and when you can cough and gag normally, you may eat soft food and drink liquids slowly.  The day after the test, you may eat your normal diet.  You may do your normal activities.  Keep all doctor visits. Get help right away if:  You get more and more short of breath.  You get light-headed.  You feel like you are going to pass out (faint).  You have chest pain.  You have new problems that worry you.  You cough up more than a little blood.  You cough up more blood than before. This information is not intended to replace advice given to you by your health care provider. Make sure you discuss any questions you have with your health care provider. Document Released: 09/13/2009 Document Revised: 04/23/2016 Document Reviewed: 07/21/2013 Elsevier Interactive Patient Education  2017 [place].  Nothing to eat and drink until 1:30 pm today    11/15/2017.  Call the office at [phone]  with any questions or concerns

## 2017-11-15 NOTE — Interval H&P Note (Signed)
PCCM Interval Note  Pt presents today for further w/u of her ground glass and micronodular infiltrates,.  No new issues, stable cough.  Discussed risks and benefits, she understands and agrees to proceed.   Vitals:   11/15/17 1055 11/15/17 1100 11/15/17 1105 11/15/17 1110  BP: 123/77 (!) 125/105 (!) 141/57   Resp: _0 Temp:      TempSrc:      SpO2: 100%  100% 100%   CT chest reviewed.  Exam stable.   Impression:  B infiltrates, micronodular disease. Etiology unclear Plan for FOB, TBBx's, BAL  Baltazar Apo, MD, PhD 11/15/2017, 11:30 AM [facility] [phone] or if no answer [phone]

## 2017-11-15 NOTE — Op Note (Signed)
[facility] Cardiop[facility] Patient Name: Lisa [name] Procedure Date: 11/15/2017 MRN: [id number] Attending MD: Collene Gobble , MD Date of Birth: 03/09/1961 CSN: [id number] Age: 56 Admit Type: Outpatient Ethnicity: Not Hispanic or Latino Procedure:            Bronchoscopy Indications:          Bilateral infiltrate, Diffuse infiltrate, Unresolving                        bilateral infiltrates Providers:            Collene Gobble, MD, Andre Lefort RRT,RCP, Ashley Mariner RRT,RCP Referring MD:          Medicines:            Midazolam 6 mg IV, Fentanyl 125 mcg IV, Lidocaine 1%                        applied to cords 12 mL, Lidocaine 1% applied to the                        tracheobronchial tree 16 mL Complications:        No immediate complications Estimated Blood Loss: Estimated blood loss was minimal. Procedure:      Pre-Anesthesia Assessment:      - A History and Physical has been performed. Patient meds and allergies       have been reviewed. The risks and benefits of the procedure and the       sedation options and risks were discussed with the patient. All       questions were answered and informed consent was obtained. Patient       identification and proposed procedure were verified prior to the       procedure by the physician in the procedure room. Mental Status       Examination: alert and oriented. Airway Examination: normal       oropharyngeal airway. Respiratory Examination: clear to auscultation. CV       Examination: regular rate and rhythm. ASA Grade Assessment: II - A       patient with mild systemic disease. After reviewing the risks and       benefits, the patient was deemed in satisfactory condition to undergo       the procedure. The anesthesia plan was to use moderate sedation /       analgesia (conscious sedation). Immediately prior to administration of       medications, the patient was re-assessed for adequacy to  receive       sedatives. The heart rate, respiratory rate, oxygen saturations, blood       pressure, adequacy of pulmonary ventilation, and response to care were       monitored throughout the procedure. The physical status of the patient       was re-assessed after the procedure.      After obtaining informed consent, the bronchoscope was passed under       direct vision. Throughout the procedure, the patient's blood pressure,       pulse, and oxygen saturations were monitored continuously. the EB [id number]       [id number] Bronchoscope was introduced through the right nostril and       advanced  to the tracheobronchial tree. The procedure was accomplished       without difficulty. The patient tolerated the procedure well. Total       fluoroscopy time was 45 seconds. The total duration of the procedure was       24 minutes. Findings:      The nasopharynx/oropharynx appears normal.      Larynx: Hyperplastic changes were found diffusely, throughout the       larynx. The changes are not obstructing the airway. 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 GERD findings were visualized       including intra-arytenoid mucosal thickening (posterior laryngitis).      The vocal cords appear normal. The subglottic space is normal. The       trachea is of normal caliber. The carina is sharp. The tracheobronchial       tree was examined to at least the first subsegmental level. Bronchial       mucosa and anatomy are normal; there are no endobronchial lesions, and       no secretions.      Transbronchial biopsies of an area of infiltration were performed in the       lateral segment of the right middle lobe and in the medial segment of       the right middle lobe using alligator forceps and sent for       histopathology examination. The procedure was guided by fluoroscopy.       Transbronchial biopsy technique was selected because the sampling site       was not visible endoscopically. Six  biopsy passes were performed. Five       biopsy samples were obtained.      Bronchoalveolar lavage was performed in the RUL apical segment (B1) of       the lung and sent for cell count, bacterial culture, viral smears &       culture, and fungal & AFB analysis and cytology. 60 mL of fluid were       instilled. 20 mL were returned. The return was blood-tinged and cloudy.       There were no mucoid plugs in the return fluid. Impression:      - Bilateral infiltrate      - Diffuse infiltrate      - Unresolving bilateral infiltrates      - Hyperplastic changes were seen diffusely, throughout the larynx.      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      - Intra-arytenoid mucosal thickening suspected to be secondary to       gastroesophageal reflux disease (GERD) was found.      - The airway examination was normal.      - Transbronchial lung biopsies were performed in the RML.      - Bronchoalveolar lavage was performed in the RUL. Moderate Sedation:      Moderate (conscious) sedation was personally administered by the       endoscopist. The following parameters were monitored: oxygen saturation,       heart rate, blood pressure, respiratory rate, EKG, adequacy of pulmonary       ventilation, and response to care. Total physician intraservice time was       24 minutes. Recommendation:      - Await BAL and biopsy results.      - Discharge patient to home (ambulatory). Procedure Code(s):      --- Professional ---  31628, Bronchoscopy, rigid or flexible, including fluoroscopic guidance,       when performed; with transbronchial lung biopsy(s), single lobe      48250, Bronchoscopy, rigid or flexible, including fluoroscopic guidance,       when performed; with bronchial alveolar lavage      99152, Moderate sedation services provided by the same physician or       other qualified health care professional performing the diagnostic or       therapeutic service that the  sedation supports, requiring the presence       of an independent trained observer to assist in the monitoring of the       patient's level of consciousness and physiological status; initial 15       minutes of intraservice time, patient age 40 years or older      [id number], Moderate sedation services; each additional 15 minutes       intraservice time Diagnosis Code(s):      --- Professional ---      R91.8, Other nonspecific abnormal finding of lung field CPT copyright 2016 [employer] Medical [employer]. All rights reserved. The codes documented in this report are preliminary and upon coder review may  be revised to meet current compliance requirements. Collene Gobble, MD Collene Gobble, MD 11/15/2017 12:09:09 PM Number of Addenda: 0 Scope In: 11:38:05 AM Scope Out: 11:54:19 AM

## 2017-11-15 NOTE — Progress Notes (Signed)
Video Bronchoscopy done  Intervention Bronchial washing done Intervention Bronchial biopsy done  Procedure tolerated well 

## 2017-11-17 ENCOUNTER — Encounter (HOSPITAL_COMMUNITY): Payer: Self-pay | Admitting: Emergency Medicine

## 2017-11-18 ENCOUNTER — Encounter (HOSPITAL_COMMUNITY): Payer: Self-pay | Admitting: Emergency Medicine

## 2017-11-18 LAB — CULTURE, BAL-QUANTITATIVE W GRAM STAIN
Culture: 30000 — AB
Special Requests: NORMAL

## 2017-11-18 LAB — ACID FAST SMEAR (AFB, MYCOBACTERIA): Acid Fast Smear: NEGATIVE

## 2017-11-18 LAB — CULTURE, BAL-QUANTITATIVE

## 2017-11-18 LAB — ACID FAST SMEAR (AFB)

## 2017-11-19 ENCOUNTER — Telehealth: Payer: Self-pay | Admitting: Emergency Medicine

## 2017-11-19 NOTE — Telephone Encounter (Signed)
Spoke with patient, aware that the results are not back at this time from [place]. Will forward to RB to advise.

## 2017-11-19 NOTE — Telephone Encounter (Signed)
Pt returning call about results. Cb is [phone].

## 2017-11-19 NOTE — Telephone Encounter (Signed)
lmtcb for pt.  

## 2017-11-27 ENCOUNTER — Other Ambulatory Visit: Payer: Self-pay | Admitting: Family

## 2017-11-29 NOTE — Telephone Encounter (Signed)
Please let the patient know that all of her testing from the bronchoscopy has come back normal.  No evidence of cancer.  Some of her culture results are still pending and we can discuss when they are finalized

## 2017-11-29 NOTE — Telephone Encounter (Signed)
RB please advise. Thanks.  

## 2017-12-01 NOTE — Telephone Encounter (Signed)
lmtcb x1 for pt. 

## 2017-12-01 NOTE — Telephone Encounter (Signed)
Called and spoke with pt letting her know the results of her bronch. Pt expressed understanding. Told pt that when we had the results from the cultures back that we would call her to let her know the results of that. Nothing further needed.

## 2017-12-01 NOTE — Telephone Encounter (Signed)
Pt is calling back [phone]

## 2017-12-15 ENCOUNTER — Ambulatory Visit: Payer: Managed Care, Other (non HMO) | Admitting: Diagnostic Neuroimaging

## 2017-12-16 LAB — FUNGUS CULTURE WITH STAIN

## 2017-12-16 LAB — FUNGUS CULTURE RESULT

## 2017-12-16 LAB — FUNGAL ORGANISM REFLEX

## 2017-12-20 ENCOUNTER — Ambulatory Visit (INDEPENDENT_AMBULATORY_CARE_PROVIDER_SITE_OTHER): Payer: Managed Care, Other (non HMO) | Admitting: Physician Assistant

## 2017-12-20 VITALS — BP 151/83 | HR 74 | Temp 98.3°F | Ht 65.0 in | Wt 264.0 lb

## 2017-12-20 DIAGNOSIS — E1121 Type 2 diabetes mellitus with diabetic nephropathy: Secondary | ICD-10-CM

## 2017-12-20 DIAGNOSIS — Z9189 Other specified personal risk factors, not elsewhere classified: Secondary | ICD-10-CM | POA: Diagnosis not present

## 2017-12-20 DIAGNOSIS — Z6841 Body Mass Index (BMI) 40.0 and over, adult: Secondary | ICD-10-CM | POA: Diagnosis not present

## 2017-12-20 DIAGNOSIS — I10 Essential (primary) hypertension: Secondary | ICD-10-CM

## 2017-12-20 DIAGNOSIS — Z794 Long term (current) use of insulin: Secondary | ICD-10-CM

## 2017-12-20 MED ORDER — LIRAGLUTIDE 18 MG/3ML ~~LOC~~ SOPN: 1.2000 mg | PEN_INJECTOR | Freq: Every morning | SUBCUTANEOUS | 0 refills | Status: DC

## 2017-12-20 MED ORDER — INSULIN PEN NEEDLE 32G X 4 MM MISC: 1.0000 | Freq: Every day | 0 refills | Status: DC

## 2017-12-20 MED ORDER — ONETOUCH LANCETS MISC: 0 refills | Status: AC

## 2017-12-21 NOTE — Progress Notes (Addendum)
Office: [phone]  /  Fax: [phone]   HPI:   Chief Complaint: OBESITY Lisa [name] is here to discuss her progress with her obesity treatment plan. She is on the Category 3 plan and is following her eating plan approximately 60 % of the time. She states she is walking for 20 minutes 5 times per week. [place] has been off track with her eating. She would like to get back on track and continue with weight loss.  Her weight is 264 lb (119.7 kg) today and has gained 7 pounds since her last visit. She has lost 7 lbs since starting treatment with [name].  Hypertension ANISE HARBIN is a 57 y.o. female with hypertension. Lisa [name]'s blood pressure is elevated. She states she has not been taking her blood pressure medications daily due to financial restraints. She declines any adjustment of her medications. She denies chest pain or shortness of breath. She is working weight loss to help control her blood pressure with the goal of decreasing her risk of heart attack and stroke. [name]'s blood pressure is not currently controlled.  At risk for cardiovascular disease Lisa [name] is at a higher than average risk for cardiovascular disease due to obesity and hypertension. She currently denies any chest pain.  Diabetes II with Chronic Kidney Disease Lisa [name] has a diagnosis of diabetes type II. Lisa [name] states she has not been checking her BGs at home. She is on Victoza and states due to cost, she has not been taking it daily. She declines metformin or any other diabetes medications today that may be a cheaper option for her. She denies any hypoglycemic episodes. Last A1c was 6.3 on 07/30/17. She has been working on intensive lifestyle modifications including diet, exercise, and weight loss to help control her blood glucose levels.  ALLERGIES: Allergies  Allergen Reactions  . Almond Oil Anaphylaxis and Nausea And Vomiting    Almond-Nuts Only  . Apple Anaphylaxis  . Ceftin [Cefuroxime] Other (See Comments)    Severe  stomach pain - Diverticulitis flares up  . Cherry Extract Anaphylaxis    Cherry fruit only   . Other Anaphylaxis and Itching    Most fruits- Are NOT tolerated (PLEASE ASK PATIENT BEFORE GIVING, AS CERTAIN FRUITS ARE TOLERATED IN LIMITED QUANTITIES!!)  . Guaifenesin & Derivatives Other (See Comments)    Restlessness, jittery  . Hctz [Hydrochlorothiazide] Other (See Comments)    Causes a headache  . Morphine And Related Other (See Comments)    Moodiness.  Causes anger.  . Naproxen Other (See Comments)    Insomnia  . Lisinopril Cough         MEDICATIONS: Current Outpatient Medications on File Prior to Visit  Medication Sig Dispense Refill  . albuterol (PROVENTIL HFA;VENTOLIN HFA) 108 (90 Base) MCG/ACT inhaler Inhale 2 puffs into the lungs every 6 (six) hours as needed for wheezing or shortness of breath. 1 Inhaler 3  . aspirin 325 MG EC tablet Take 1 tablet (325 mg total) by mouth daily. 30 tablet 10  . atorvastatin (LIPITOR) 40 MG tablet Take 1 tablet (40 mg total) by mouth daily. 90 tablet 3  . Blood Glucose Monitoring Suppl (ONE TOUCH ULTRA MINI) w/Device KIT Use meter to check blood sugars 1-4 times daily as instructed. 1 each 0  . buPROPion (WELLBUTRIN SR) 150 MG 12 hr tablet Take 150 mg by mouth in the morning  0  . carvedilol (COREG) 25 MG tablet TAKE 1 TABLET (25 MG TOTAL) BY MOUTH 2 (TWO) TIMES DAILY. 180 tablet  3  . dicyclomine (BENTYL) 10 MG capsule Take 1 capsule (10 mg total) by mouth daily.    Marland Kitchen doxazosin (CARDURA) 2 MG tablet TAKE 1 TABLET (2 MG TOTAL) BY MOUTH 2 (TWO) TIMES DAILY. 180 tablet 0  . glucose blood (ONE TOUCH ULTRA TEST) test strip 1 each by Other route as directed. Use one strip per test. Test blood sugars 1-4 times daily as instructed. 100 each 3  . linaclotide (LINZESS) 145 MCG CAPS capsule Take 145 mcg by mouth daily before breakfast.    . losartan (COZAAR) 100 MG tablet TAKE 1 TABLET (100 MG TOTAL) BY MOUTH DAILY. 90 tablet 1  . Multiple Vitamin  (MULTIVITAMIN WITH MINERALS) TABS tablet Take 1 tablet by mouth daily.    Marland Kitchen omeprazole (PRILOSEC) 40 MG capsule Take 1 capsule (40 mg total) by mouth daily.    . polyethylene glycol (MIRALAX / GLYCOLAX) packet Take 17 g by mouth daily. 14 each 0  . senna (SENOKOT) 8.6 MG TABS tablet Take 1 tablet (8.6 mg total) by mouth daily as needed for mild constipation. 120 each 0  . spironolactone (ALDACTONE) 50 MG tablet TAKE 1 TABLET (50 MG TOTAL) BY MOUTH DAILY. 90 tablet 3   No current facility-administered medications on file prior to visit.     PAST MEDICAL HISTORY: Past Medical History:  Diagnosis Date  . Allergic rhinitis   . Asthma   . Breast nodule    right breast, to see dr Helane Rima 06-20-2012 for   . Chest pain, non-cardiac   . Chronic headaches   . Cigarette smoker   . Complication of anesthesia 05-17-2012   trouble breathing after colonscopy, needed nebulizer  . Diverticulosis   . GERD (gastroesophageal reflux disease)   . Hepatic hemangioma   . Hypertension   . IBS (irritable bowel syndrome)   . Obesity   . Prosthetic eye globe   . Pyloric erosion   . Sleep apnea    CPAP setting varies from 4-10  . Type 2 diabetes mellitus ([place]) 03/11/2017    PAST SURGICAL HISTORY: Past Surgical History:  Procedure Laterality Date  . ABDOMINAL AORTOGRAM W/LOWER EXTREMITY N/A 04/30/2017   Procedure: Abdominal Aortogram w/Lower Extremity;  Surgeon: Elam Dutch, MD;  Location: [facility];  Service: Cardiovascular;  Laterality: N/A;  . CARDIAC CATHETERIZATION  12/28/2007   EF 75%. IT REVEALS NORMAL/SUPRANORMAL LEFT VENTRICULAR SYSTOLIC FUNCTION  . CARDIOVASCULAR STRESS TEST  03/25/2007   EF 78%  . CESAREAN SECTION  2003, 2009   x 2  . CHOLECYSTECTOMY  1993  . COLONOSCOPY  2014  . ENDOMETRIAL ABLATION  09/2010   and D&C  . HERNIA REPAIR  3614   umbilical  . LAPAROSCOPY    . PERIPHERAL VASCULAR BALLOON ANGIOPLASTY Left 04/30/2017   Procedure: Peripheral Vascular Balloon  Angioplasty;  Surgeon: Elam Dutch, MD;  Location: [facility];  Service: Cardiovascular;  Laterality: Left;  SFA  . TUBAL LIGATION    . UMBILICAL HERNIA REPAIR  2004  . US ECHOCARDIOGRAPHY  05/08/2008   EF 55-60%  . VENTRAL HERNIA REPAIR  06/15/2012   Procedure: LAPAROSCOPIC VENTRAL HERNIA;  Surgeon: Edward Jolly, MD;  Location: [facility];  Service: General;  Laterality: N/A;  LAPAROSCOPIC REPAIR VENTRAL HERNIA  . VIDEO BRONCHOSCOPY Bilateral 11/15/2017   Procedure: VIDEO BRONCHOSCOPY WITH FLUORO;  Surgeon: Collene Gobble, MD;  Location: [facility];  Service: Cardiopulmonary;  Laterality: Bilateral;    SOCIAL HISTORY: Social History   Tobacco  Use  . Smoking status: Current Every Day Smoker    Packs/day: 1.00    Years: 25.00    Pack years: 25.00    Types: Cigarettes  . Smokeless tobacco: Never Used  Substance Use Topics  . Alcohol use: No    Alcohol/week: 0.0 oz  . Drug use: No    FAMILY HISTORY: Family History  Problem Relation Age of Onset  . Hypertension Mother   . Lung cancer Mother        49  . Diabetes Maternal Grandmother   . Heart disease Father        pacemaker or defibrillator  . Heart attack Paternal Uncle   . Breast cancer Unknown   . Colon cancer Neg Hx     ROS: Review of Systems  Constitutional: Negative for weight loss.  Respiratory: Negative for shortness of breath.   Cardiovascular: Negative for chest pain.  Endo/Heme/Allergies:       Negative hypoglycemia    PHYSICAL EXAM: Blood pressure (!) 151/83, pulse 74, temperature 98.3 F (36.8 C), temperature source Oral, height '5\' 5"'$  (1.651 m), weight 264 lb (119.7 kg), SpO2 97 %. Body mass index is 43.93 kg/m. Physical Exam  Constitutional: She is oriented to person, place, and time. She appears well-developed and well-nourished.  Cardiovascular: Normal rate.  Pulmonary/Chest: Effort normal.  Musculoskeletal: Normal range of motion.  Neurological: She is oriented to person,  place, and time.  Skin: Skin is warm and dry.  Psychiatric: She has a normal mood and affect. Her behavior is normal.  Vitals reviewed.   RECENT LABS AND TESTS: BMET    Component Value Date/Time   NA 137 10/15/2017 0112   NA 138 04/22/2017 1145   K 3.2 (L) 10/15/2017 0112   CL 106 10/15/2017 0112   CO2 23 10/15/2017 0112   GLUCOSE 107 (H) 10/15/2017 0112   BUN 21 (H) 10/15/2017 0112   BUN 19 04/22/2017 1145   CREATININE 1.26 (H) 10/15/2017 0112   CREATININE 1.08 (H) 11/12/2016 1040   CALCIUM 9.7 10/15/2017 0112   GFRNONAA 47 (L) 10/15/2017 0112   GFRAA 55 (L) 10/15/2017 0112   Lab Results  Component Value Date   HGBA1C 6.3 07/30/2017   HGBA1C 7.0 (H) 04/22/2017   HGBA1C 7.7 (H) 03/11/2017   HGBA1C 6.2 02/13/2014   Lab Results  Component Value Date   INSULIN 19.6 04/22/2017   CBC    Component Value Date/Time   WBC 11.5 (H) 10/15/2017 0112   RBC 4.70 10/15/2017 0112   HGB 14.4 10/15/2017 0112   HGB 14.3 04/22/2017 1145   HCT 41.9 10/15/2017 0112   HCT 42.3 04/22/2017 1145   PLT 232 10/15/2017 0112   MCV 89.1 10/15/2017 0112   MCV 90 04/22/2017 1145   MCH 30.6 10/15/2017 0112   MCHC 34.4 10/15/2017 0112   RDW 13.7 10/15/2017 0112   RDW 13.9 04/22/2017 1145   LYMPHSABS 2.4 06/23/2017 1221   LYMPHSABS 2.5 04/22/2017 1145   MONOABS 0.5 06/23/2017 1221   EOSABS 0.1 06/23/2017 1221   EOSABS 0.1 04/22/2017 1145   BASOSABS 0.0 06/23/2017 1221   BASOSABS 0.0 04/22/2017 1145   Iron/TIBC/Ferritin/ %Sat No results found for: IRON, TIBC, FERRITIN, IRONPCTSAT Lipid Panel     Component Value Date/Time   CHOL 130 04/22/2017 1145   TRIG 141 04/22/2017 1145   HDL 25 (L) 04/22/2017 1145   CHOLHDL 6 05/26/2016 1134   VLDL 26.0 05/26/2016 1134   LDLCALC 77 04/22/2017 1145   Hepatic  Function Panel     Component Value Date/Time   PROT 7.3 06/23/2017 1221   PROT 7.5 04/22/2017 1145   ALBUMIN 3.6 06/23/2017 1221   ALBUMIN 4.3 04/22/2017 1145   AST 17 06/23/2017 1221    ALT 18 06/23/2017 1221   ALKPHOS 85 06/23/2017 1221   BILITOT 0.6 06/23/2017 1221   BILITOT 0.4 04/22/2017 1145   BILIDIR 0.1 02/28/2016 1414      Component Value Date/Time   TSH 1.040 04/22/2017 1145   TSH 1.70 02/13/2014 1145   TSH 1.795 Test methodology is 3rd generation TSH 12/27/2007 1447    ASSESSMENT AND PLAN: Essential hypertension  Type 2 diabetes mellitus with diabetic nephropathy, with long-term current use of insulin (HCC) - Plan: liraglutide (VICTOZA) 18 MG/3ML SOPN, ONE TOUCH LANCETS MISC, Insulin Pen Needle 32G X 4 MM MISC  At risk for heart disease  Class 3 severe obesity with serious comorbidity and body mass index (BMI) of 40.0 to 44.9 in adult, unspecified obesity type ([place])  PLAN:  Hypertension We discussed sodium restriction, working on healthy weight loss, and a regular exercise program as the means to achieve improved blood pressure control. Hien agreed with this plan and agreed to follow up as directed. We will continue to monitor her blood pressure as well as her progress with the above lifestyle modifications. She will continue her medications as prescribed and will watch for signs of hypotension as she continues her lifestyle modifications. Kassadie agrees to follow up with our clinic in 2 weeks.  Cardiovascular risk counselling Kaityln was given extended (15 minutes) coronary artery disease prevention counseling today. She is 57 y.o. female and has risk factors for heart disease including obesity. We discussed intensive lifestyle modifications today with an emphasis on specific weight loss instructions and strategies. Pt was also informed of the importance of increasing exercise and decreasing saturated fats to help prevent heart disease.  Diabetes II with Chronic Kidney Disease Dijon has been given extensive diabetes education by myself today including ideal fasting and post-prandial blood glucose readings, individual ideal Hgb A1c goals and hypoglycemia  prevention. We discussed the importance of good blood sugar control to decrease the likelihood of diabetic complications such as nephropathy, neuropathy, limb loss, blindness, coronary artery disease, and death. We discussed the importance of intensive lifestyle modification including diet, exercise and weight loss as the first line treatment for diabetes. Merriel agrees to continue Victoza 3 pens 1.2 mg qd and we will refill for 1 month and we will refill pen needles and Lancets for 1 month. Nathan agrees to follow up with our clinic in 2 weeks.  Obesity Sherrilyn is currently in the action stage of change. As such, her goal is to continue with weight loss efforts She has agreed to follow the Category 3 plan Britanie has been instructed to work up to a goal of 150 minutes of combined cardio and strengthening exercise per week for weight loss and overall health benefits. We discussed the following Behavioral Modification Strategies today: increasing lean protein intake, decrease eating out, better snacking choices, and planning for success   Romana has agreed to follow up with our clinic in 2 weeks. She was informed of the importance of frequent follow up visits to maximize her success with intensive lifestyle modifications for her multiple health conditions.   OBESITY BEHAVIORAL INTERVENTION VISIT  Today's visit was # 11 out of 22.  Starting weight: 271 lbs Starting date: 04/22/17 Today's weight : 264 lbs  Today's date: 12/20/2017 Total  lbs lost to date: 7 (Patients must lose 7 lbs in the first 6 months to continue with counseling)   ASK: We discussed the diagnosis of obesity with [name] today and Elfa agreed to give [name] permission to discuss obesity behavioral modification therapy today.  ASSESS: Shawnda has the diagnosis of obesity and her BMI today is 43.93 Amandamarie is in the action stage of change   ADVISE: Jenisa was educated on the multiple health risks of obesity as well as the benefit  of weight loss to improve her health. She was advised of the need for long term treatment and the importance of lifestyle modifications.  AGREE: Multiple dietary modification options and treatment options were discussed and  Mechele agreed to the above obesity treatment plan.   Wilhemena Durie, am acting as transcriptionist for Lacy Duverney, PA-C I, Lacy Duverney [facility], have reviewed this note and agree with its contents.   I have reviewed the above documentation for accuracy and completeness, and I agree with the above. -Dennard Nip, MD

## 2017-12-28 ENCOUNTER — Ambulatory Visit: Payer: Managed Care, Other (non HMO) | Admitting: Diagnostic Neuroimaging

## 2017-12-28 ENCOUNTER — Encounter: Payer: Self-pay | Admitting: Diagnostic Neuroimaging

## 2017-12-28 VITALS — BP 131/77 | HR 73 | Ht 65.0 in | Wt 269.4 lb

## 2017-12-28 DIAGNOSIS — G43009 Migraine without aura, not intractable, without status migrainosus: Secondary | ICD-10-CM

## 2017-12-28 DIAGNOSIS — G459 Transient cerebral ischemic attack, unspecified: Secondary | ICD-10-CM | POA: Diagnosis not present

## 2017-12-28 NOTE — Progress Notes (Signed)
[facility]  PATIENT: Lisa [name] DOB: 1961/01/23  REFERRING CLINICIAN:  HISTORY FROM: patient REASON FOR VISIT: follow up   HISTORICAL  CHIEF COMPLAINT:  Chief Complaint  Patient presents with  . Follow-up  . Migraine    still has flashing lights R (has come back lately).     HISTORY OF PRESENT ILLNESS:   UPDATE (12/28/17, VRP): Since last visit, doing well. Tolerating meds. No alleviating or aggravating factors. No further spells except for intermittent "flashing lights" sensations.  UPDATE 08/10/17: Since last visit, patient was doing better in 2014-2015, then stopped following up in our clinic. Overall her prior severe HA have essentially resolved. Her mother passed away in Jan 07, 2014, and patient was under high stress prior to her mother passing away. 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   In 06/22/17, had 30 second episode of inability to speak, with generalized tremors and shakiness, but no loss of consciousness, CP, SOB, numbness, weakness. Then felt head pressure sensation. Went to ER the next day for evaluation due to persistent headache. Had MRI brain which was negative for acute stroke. Then d/c home with follow up in neurology clinic.   Since then having intermittent HA. Using tylenol as needed for HA. Has phonophobia. No nausea, vomiting, photophobia. No vision changes with HA. HA are occurring 8 days per month.   UPDATE 07/26/13: Since last visit patient's headaches have slightly changed. Now she has a throbbing bilateral temporal, facial, neck pain and headache sensation. No nausea vomiting. She has a sensitivity to light. Sometimes wakes up in the middle the night with these headaches. No more thunderclap headaches. Patient is on CPAP for sleep apnea. Patient is also asking about possible Bell's palsy, which her grandson's endocrinologist was concerned may have developed over the past 3 months. Patient  denies any new unilateral facial weakness or numbness.  UPDATE 04/15/12: Since last visit, HA reduced. Had 2 HA since last visit. Not taken any meds for this. Questions about dx and tx reviewed again. No aggravating factors.  PRIOR HPI (03/01/12): 57 year old left-handed female with history of hypertension, sleep apnea, here for evaluation of increasing headaches.  Patient has at least 10 year history of intermittent headaches.  Her typical headaches consist of pressure and throbbing sensation, nausea.  Patient developed a new type of headache in February 2013 while straining on the bathroom.  She states felt a sensation of "fabric ripping" in her head.  Peak headache intensity was quite rapid.  After 30 min. headaches started to reduced in severity.  Few days later patient had developed a similar headache sensation while having sexual intercourse near climax. Patient has had CT of the head, MRI brain, MRA head since that time.  She is taking hydrocodone with Tylenol as needed for headache.  Since that time her headaches have subsided, but not fully recovered.  No specific triggering factors.   REVIEW OF SYSTEMS: Full 14 system review of systems performed and negative except: ringing in ears dizziness joint pain blood in urine.   ALLERGIES: Allergies  Allergen Reactions  . Almond Oil Anaphylaxis and Nausea And Vomiting    Almond-Nuts Only  . Apple Anaphylaxis  . Ceftin [Cefuroxime] Other (See Comments)    Severe stomach pain - Diverticulitis flares up  . Cherry Extract Anaphylaxis    Cherry fruit only   . Other Anaphylaxis and Itching    Most fruits- Are NOT tolerated (PLEASE ASK PATIENT BEFORE  GIVING, AS CERTAIN FRUITS ARE TOLERATED IN LIMITED QUANTITIES!!)  . Guaifenesin & Derivatives Other (See Comments)    Restlessness, jittery  . Hctz [Hydrochlorothiazide] Other (See Comments)    Causes a headache  . Morphine And Related Other (See Comments)    Moodiness.  Causes anger.  . Naproxen  Other (See Comments)    Insomnia  . Lisinopril Cough         HOME MEDICATIONS:  Outpatient Medications Prior to Visit  Medication Sig Dispense Refill  . albuterol (PROVENTIL HFA;VENTOLIN HFA) 108 (90 Base) MCG/ACT inhaler Inhale 2 puffs into the lungs every 6 (six) hours as needed for wheezing or shortness of breath. 1 Inhaler 3  . aspirin 325 MG EC tablet Take 1 tablet (325 mg total) by mouth daily. 30 tablet 10  . atorvastatin (LIPITOR) 40 MG tablet Take 1 tablet (40 mg total) by mouth daily. 90 tablet 3  . Blood Glucose Monitoring Suppl (ONE TOUCH ULTRA MINI) w/Device KIT Use meter to check blood sugars 1-4 times daily as instructed. 1 each 0  . buPROPion (WELLBUTRIN SR) 150 MG 12 hr tablet Take 150 mg by mouth in the morning  0  . carvedilol (COREG) 25 MG tablet TAKE 1 TABLET (25 MG TOTAL) BY MOUTH 2 (TWO) TIMES DAILY. 180 tablet 3  . dicyclomine (BENTYL) 10 MG capsule Take 1 capsule (10 mg total) by mouth daily.    Marland Kitchen doxazosin (CARDURA) 2 MG tablet TAKE 1 TABLET (2 MG TOTAL) BY MOUTH 2 (TWO) TIMES DAILY. 180 tablet 0  . glucose blood (ONE TOUCH ULTRA TEST) test strip 1 each by Other route as directed. Use one strip per test. Test blood sugars 1-4 times daily as instructed. 100 each 3  . liraglutide (VICTOZA) 18 MG/3ML SOPN Inject 0.2 mLs (1.2 mg total) into the skin every morning. 2 pen 0  . losartan (COZAAR) 100 MG tablet TAKE 1 TABLET (100 MG TOTAL) BY MOUTH DAILY. 90 tablet 1  . Multiple Vitamin (MULTIVITAMIN WITH MINERALS) TABS tablet Take 1 tablet by mouth daily.    Marland Kitchen omeprazole (PRILOSEC) 40 MG capsule Take 1 capsule (40 mg total) by mouth daily.    . ONE TOUCH LANCETS MISC Use to check blood sugars twice a day 200 each 0  . spironolactone (ALDACTONE) 50 MG tablet TAKE 1 TABLET (50 MG TOTAL) BY MOUTH DAILY. 90 tablet 3  . Insulin Pen Needle 32G X 4 MM MISC 1 Package by Does not apply route daily at 12 noon. 100 each 0  . linaclotide (LINZESS) 145 MCG CAPS capsule Take 145 mcg by  mouth daily before breakfast.    . polyethylene glycol (MIRALAX / GLYCOLAX) packet Take 17 g by mouth daily. (Patient not taking: Reported on 12/28/2017) 14 each 0  . senna (SENOKOT) 8.6 MG TABS tablet Take 1 tablet (8.6 mg total) by mouth daily as needed for mild constipation. (Patient not taking: Reported on 12/28/2017) 120 each 0   No facility-administered medications prior to visit.     PAST MEDICAL HISTORY: Past Medical History:  Diagnosis Date  . Allergic rhinitis   . Asthma   . Breast nodule    right breast, to see dr Helane Rima 06-20-2012 for   . Chest pain, non-cardiac   . Chronic headaches   . Cigarette smoker   . Complication of anesthesia 05-17-2012   trouble breathing after colonscopy, needed nebulizer  . Diverticulosis   . GERD (gastroesophageal reflux disease)   . Hepatic hemangioma   . Hypertension   .  IBS (irritable bowel syndrome)   . Obesity   . Prosthetic eye globe   . Pyloric erosion   . Sleep apnea    CPAP setting varies from 4-10  . Type 2 diabetes mellitus ([place]) 03/11/2017    PAST SURGICAL HISTORY: Past Surgical History:  Procedure Laterality Date  . ABDOMINAL AORTOGRAM W/LOWER EXTREMITY N/A 04/30/2017   Procedure: Abdominal Aortogram w/Lower Extremity;  Surgeon: Elam Dutch, MD;  Location: [facility];  Service: Cardiovascular;  Laterality: N/A;  . CARDIAC CATHETERIZATION  12/28/2007   EF 75%. IT REVEALS NORMAL/SUPRANORMAL LEFT VENTRICULAR SYSTOLIC FUNCTION  . CARDIOVASCULAR STRESS TEST  03/25/2007   EF 78%  . CESAREAN SECTION  2003, 2009   x 2  . CHOLECYSTECTOMY  1993  . COLONOSCOPY  2014  . ENDOMETRIAL ABLATION  09/2010   and D&C  . HERNIA REPAIR  4315   umbilical  . LAPAROSCOPY    . PERIPHERAL VASCULAR BALLOON ANGIOPLASTY Left 04/30/2017   Procedure: Peripheral Vascular Balloon Angioplasty;  Surgeon: Elam Dutch, MD;  Location: [facility];  Service: Cardiovascular;  Laterality: Left;  SFA  . TUBAL LIGATION    . UMBILICAL  HERNIA REPAIR  2004  . US ECHOCARDIOGRAPHY  05/08/2008   EF 55-60%  . VENTRAL HERNIA REPAIR  06/15/2012   Procedure: LAPAROSCOPIC VENTRAL HERNIA;  Surgeon: Edward Jolly, MD;  Location: [facility];  Service: General;  Laterality: N/A;  LAPAROSCOPIC REPAIR VENTRAL HERNIA  . VIDEO BRONCHOSCOPY Bilateral 11/15/2017   Procedure: VIDEO BRONCHOSCOPY WITH FLUORO;  Surgeon: Collene Gobble, MD;  Location: [facility];  Service: Cardiopulmonary;  Laterality: Bilateral;    FAMILY HISTORY: Family History  Problem Relation Age of Onset  . Hypertension Mother   . Lung cancer Mother        65  . Diabetes Maternal Grandmother   . Heart disease Father        pacemaker or defibrillator  . Heart attack Paternal Uncle   . Breast cancer Unknown   . Colon cancer Neg Hx     SOCIAL HISTORY:  Social History   Socioeconomic History  . Marital status: Married    Spouse name: Science writer  . Number of children: 2  . Years of education: 14+  . Highest education level: Not on file  Social Needs  . Financial resource strain: Not on file  . Food insecurity - worry: Not on file  . Food insecurity - inability: Not on file  . Transportation needs - medical: Not on file  . Transportation needs - non-medical: Not on file  Occupational History  . Occupation: stay at home mom  . Occupation: Ship broker  Tobacco Use  . Smoking status: Current Every Day Smoker    Packs/day: 1.00    Years: 25.00    Pack years: 25.00    Types: Cigarettes  . Smokeless tobacco: Never Used  Substance and Sexual Activity  . Alcohol use: No    Alcohol/week: 0.0 oz  . Drug use: No  . Sexual activity: Not Currently  Other Topics Concern  . Not on file  Social History Narrative   Married 1997. 2 sons 50 and 27 years old in 10/18. Wants to see children grow up.       Customer Service/accounts payable.    Went to school for medical assisting- has had a hard time finding work      Hobbies: [employer], enjoying [place], getting out of home,  wants to take [employer]  PHYSICAL EXAM  GENERAL EXAM/CONSTITUTIONAL: Vitals:  Vitals:   12/28/17 1054  BP: 131/77  Pulse: 73  Weight: 269 lb 6.4 oz (122.2 kg)  Height: 5' 5" (1.651 m)   Wt Readings from Last 3 Encounters:  12/28/17 269 lb 6.4 oz (122.2 kg)  12/20/17 264 lb (119.7 kg)  11/11/17 261 lb 14.4 oz (118.8 kg)    Patient is in no distress; well developed, nourished and groomed; neck is supple  CARDIOVASCULAR:  Examination of carotid arteries is normal; no carotid bruits  Regular rate and rhythm, no murmurs  Examination of peripheral vascular system by observation and palpation is normal  EYES:  Ophthalmoscopic exam of optic discs and posterior segments is normal; no papilledema or hemorrhages  MUSCULOSKELETAL:  Gait, strength, tone, movements noted in Neurologic exam below  NEUROLOGIC: MENTAL STATUS:   awake, alert, oriented to person, place and time  recent and remote memory intact  normal attention and concentration  language fluent, comprehension intact, naming intact,   fund of knowledge appropriate  CRANIAL NERVE:   2nd - no papilledema on fundoscopic exam  2nd, 3rd, 4th, 6th - LEFT EYE CONGENTIAL MALFORMATION WITH PROSTHESIS. NO LIGHT PERCEPTION. NO RXN TO LIGHT. DECREASED LEFT EYE MOVEMENTS. LEFT EYELID LOWER THAN RIGHT. RIGHT LOWER FACIAL MOVEMENTS DECREASED, WITH DECR RIGHT NL FOLD. APPEARS SIMILAR TO [address]'S LICENCE PICTURE. RIGHT PUPIUL REACTIVE. Visual field IN RIGHT EYE NORMAL. Visual fields full to confrontation, extraocular muscles intact, no nystagmus  5th - facial sensation symmetric  7th - facial strength symmetric  8th - hearing intact  9th - palate elevates symmetrically, uvula midline  11th - shoulder shrug symmetric  12th - tongue protrusion midline  MOTOR:   normal bulk and tone, full strength in the BUE, BLE; DECR RIGHT HAND GRIP DUE TO PAIN  SENSORY:   normal and symmetric to light  touch  COORDINATION:   finger-nose-finger, fine finger movements SLOW ON RIGHT HAND  REFLEXES:   deep tendon reflexes TRACE and symmetric  GAIT/STATION:   narrow based gait; ANTALGIC     DIAGNOSTIC DATA (LABS, IMAGING, TESTING) - I reviewed patient records, labs, notes, testing and imaging myself where available.  Lab Results  Component Value Date   WBC 11.5 (H) 10/15/2017   HGB 14.4 10/15/2017   HCT 41.9 10/15/2017   MCV 89.1 10/15/2017   PLT 232 10/15/2017      Component Value Date/Time   NA 137 10/15/2017 0112   NA 138 04/22/2017 1145   K 3.2 (L) 10/15/2017 0112   CL 106 10/15/2017 0112   CO2 23 10/15/2017 0112   GLUCOSE 107 (H) 10/15/2017 0112   BUN 21 (H) 10/15/2017 0112   BUN 19 04/22/2017 1145   CREATININE 1.26 (H) 10/15/2017 0112   CREATININE 1.08 (H) 11/12/2016 1040   CALCIUM 9.7 10/15/2017 0112   PROT 7.3 06/23/2017 1221   PROT 7.5 04/22/2017 1145   ALBUMIN 3.6 06/23/2017 1221   ALBUMIN 4.3 04/22/2017 1145   AST 17 06/23/2017 1221   ALT 18 06/23/2017 1221   ALKPHOS 85 06/23/2017 1221   BILITOT 0.6 06/23/2017 1221   BILITOT 0.4 04/22/2017 1145   GFRNONAA 47 (L) 10/15/2017 0112   GFRAA 55 (L) 10/15/2017 0112   Lab Results  Component Value Date   CHOL 130 04/22/2017   Lab Results  Component Value Date   HGBA1C 6.3 07/30/2017   Lab Results  Component Value Date   ZDGLOVFI43 329 04/22/2017   Lab Results  Component Value Date  TSH 1.795 *Test methodology is 3rd generation TSH* 12/27/2007    02/12/12 MRI brain - monoventricle, absent septum pellucidum; stable from MRI on 04/03/08  02/12/12 MRA head - normal  07/25/15 TTE - Normal LV systolic function; grade 1 diastolic dysfunction; mild LAE.  06/23/17 MRI brain [I reviewed images myself and agree with interpretation. -VRP]  1. No acute intracranial infarct or other process identified. 2. 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 No transependymal flow of CSF. Overall, appearance is stable relative to 2009. 3. Otherwise unremarkable brain MRI.  08/18/17 TTE - Left ventricle: The cavity size was normal. Wall thickness was   increased in a pattern of mild LVH. Systolic function was normal.   The estimated ejection fraction was in the range of 60% to 65%.   Left ventricular diastolic function parameters were normal. - Left atrium: The atrium was mildly dilated. - Impressions: Suggest bubble study to r/o PFO if clinically   indicated.  09/07/17 carotid u/s Right Carotid: There is evidence in the right ICA of a less than 40% stenosis. Left Carotid: There is evidence in the left ICA of a less than 40% stenosis. Vertebrals: Both vertebral arteries were patent with antegrade flow. Subclavians: Normal flow hemodynamics were seen in bilateral subclavian arteries.      ASSESSMENT AND PLAN  57 y.o. female with history of headaches since 2003, with new type of headache in February 2013 (primary thunderclap headache and primary coital headache vs. migraine variant).  Now with new transient aphasia event on 08/13/77 (TIA vs complicated migraine).   Also MRI findings (monoventricle) are stable over time.  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   Dx:  Transient cerebral ischemia, unspecified type  Migraine without aura and without status migrainosus, not intractable    PLAN:  TRANSIENT APHASIA (TIA vs COMPLICATED MIGRAINE) - continue plavix 31m + aspirin 3245m(rx'd for peripheral arterial disease) - continue BP, diabetes and lipid control - try to stop smoking  MIGRAINE TREATMENT - consider topiramate, botox for migraine or CGRP antagonist for migraine prevention - tylenol as needed for HA (will avoid triptans due to recent possible TIA)  Return if symptoms worsen or fail to improve, for return to  PCP.    VIPenni BombardMD 12/01/93/[id number]:65M Certified in Neurology, Neurophysiology and Neuroimaging  [facility] [address] Gro[place]Deer Creek[place] [id number] [phone]

## 2017-12-31 LAB — ACID FAST CULTURE WITH REFLEXED SENSITIVITIES: ACID FAST CULTURE - AFSCU3: NEGATIVE

## 2018-01-04 ENCOUNTER — Ambulatory Visit (INDEPENDENT_AMBULATORY_CARE_PROVIDER_SITE_OTHER): Payer: Managed Care, Other (non HMO) | Admitting: Physician Assistant

## 2018-01-04 VITALS — BP 143/89 | HR 70 | Temp 98.4°F | Ht 65.0 in | Wt 267.0 lb

## 2018-01-04 DIAGNOSIS — E1129 Type 2 diabetes mellitus with other diabetic kidney complication: Secondary | ICD-10-CM

## 2018-01-04 DIAGNOSIS — I10 Essential (primary) hypertension: Secondary | ICD-10-CM | POA: Diagnosis not present

## 2018-01-05 ENCOUNTER — Other Ambulatory Visit: Payer: Self-pay | Admitting: Family

## 2018-01-05 ENCOUNTER — Other Ambulatory Visit: Payer: Self-pay | Admitting: Emergency Medicine

## 2018-01-05 NOTE — Progress Notes (Signed)
Office: [phone]  /  Fax: [phone]   HPI:   Chief Complaint: OBESITY Lisa [name] is here to discuss her progress with her obesity treatment plan. She is on the Category 3 plan and is following her eating plan approximately 80 % of the time. She states she is walking for 30 minutes 7 times per week. Litha states she is retaining fluids in her ankles as she is not on her diuretic due to lost. She skips meals on occasions and also has been drinking more liquid calories.  Her weight is 267 lb (121.1 kg) today and has gained 3 pounds since her last visit. She has lost 4 lbs since starting treatment with [name].  Hypertension ELVENIA GODDEN is a 57 y.o. female with hypertension. Lillia's blood pressure is elevated. She has not been on spironolactone due to cost. She denies any adjustments to her medications. She denies chest pain or shortness of breath. She is working weight loss to help control her blood pressure with the goal of decreasing her risk of heart attack and stroke. Amalia's blood pressure is not currently controlled.  Diabetes II with Chronic Kidney Disease Xiao has a diagnosis of diabetes type II. Jahnavi states she is not checking BGs at home. She states she is unable to afford Victoza. She was advised on going on metformin to afford it but declines any changes in her medications. She denies any hypoglycemic episodes. Last A1c was 6.3 on 07/30/17. She has been working on intensive lifestyle modifications including diet, exercise, and weight loss to help control her blood glucose levels.  ALLERGIES: Allergies  Allergen Reactions  . Almond Oil Anaphylaxis and Nausea And Vomiting    Almond-Nuts Only  . Apple Anaphylaxis  . Ceftin [Cefuroxime] Other (See Comments)    Severe stomach pain - Diverticulitis flares up  . Cherry Extract Anaphylaxis    Cherry fruit only   . Other Anaphylaxis and Itching    Most fruits- Are NOT tolerated (PLEASE ASK PATIENT BEFORE GIVING, AS CERTAIN FRUITS  ARE TOLERATED IN LIMITED QUANTITIES!!)  . Guaifenesin & Derivatives Other (See Comments)    Restlessness, jittery  . Hctz [Hydrochlorothiazide] Other (See Comments)    Causes a headache  . Morphine And Related Other (See Comments)    Moodiness.  Causes anger.  . Naproxen Other (See Comments)    Insomnia  . Lisinopril Cough         MEDICATIONS: Current Outpatient Medications on File Prior to Visit  Medication Sig Dispense Refill  . albuterol (PROVENTIL HFA;VENTOLIN HFA) 108 (90 Base) MCG/ACT inhaler Inhale 2 puffs into the lungs every 6 (six) hours as needed for wheezing or shortness of breath. 1 Inhaler 3  . aspirin 325 MG EC tablet Take 1 tablet (325 mg total) by mouth daily. 30 tablet 10  . atorvastatin (LIPITOR) 40 MG tablet Take 1 tablet (40 mg total) by mouth daily. 90 tablet 3  . Blood Glucose Monitoring Suppl (ONE TOUCH ULTRA MINI) w/Device KIT Use meter to check blood sugars 1-4 times daily as instructed. 1 each 0  . buPROPion (WELLBUTRIN SR) 150 MG 12 hr tablet Take 150 mg by mouth in the morning  0  . carvedilol (COREG) 25 MG tablet TAKE 1 TABLET (25 MG TOTAL) BY MOUTH 2 (TWO) TIMES DAILY. 180 tablet 3  . dicyclomine (BENTYL) 10 MG capsule Take 1 capsule (10 mg total) by mouth daily.    Marland Kitchen doxazosin (CARDURA) 2 MG tablet TAKE 1 TABLET (2 MG TOTAL) BY MOUTH  2 (TWO) TIMES DAILY. 180 tablet 0  . glucose blood (ONE TOUCH ULTRA TEST) test strip 1 each by Other route as directed. Use one strip per test. Test blood sugars 1-4 times daily as instructed. 100 each 3  . Insulin Pen Needle 32G X 4 MM MISC 1 Package by Does not apply route daily at 12 noon. 100 each 0  . linaclotide (LINZESS) 145 MCG CAPS capsule Take 145 mcg by mouth daily before breakfast.    . liraglutide (VICTOZA) 18 MG/3ML SOPN Inject 0.2 mLs (1.2 mg total) into the skin every morning. 2 pen 0  . losartan (COZAAR) 100 MG tablet TAKE 1 TABLET (100 MG TOTAL) BY MOUTH DAILY. 90 tablet 1  . Multiple Vitamin (MULTIVITAMIN  WITH MINERALS) TABS tablet Take 1 tablet by mouth daily.    Marland Kitchen omeprazole (PRILOSEC) 40 MG capsule Take 1 capsule (40 mg total) by mouth daily.    . ONE TOUCH LANCETS MISC Use to check blood sugars twice a day 200 each 0  . spironolactone (ALDACTONE) 50 MG tablet TAKE 1 TABLET (50 MG TOTAL) BY MOUTH DAILY. 90 tablet 3   No current facility-administered medications on file prior to visit.     PAST MEDICAL HISTORY: Past Medical History:  Diagnosis Date  . Allergic rhinitis   . Asthma   . Breast nodule    right breast, to see dr Helane Rima 06-20-2012 for   . Chest pain, non-cardiac   . Chronic headaches   . Cigarette smoker   . Complication of anesthesia 05-17-2012   trouble breathing after colonscopy, needed nebulizer  . Diverticulosis   . GERD (gastroesophageal reflux disease)   . Hepatic hemangioma   . Hypertension   . IBS (irritable bowel syndrome)   . Obesity   . Prosthetic eye globe   . Pyloric erosion   . Sleep apnea    CPAP setting varies from 4-10  . Type 2 diabetes mellitus ([place]) 03/11/2017    PAST SURGICAL HISTORY: Past Surgical History:  Procedure Laterality Date  . ABDOMINAL AORTOGRAM W/LOWER EXTREMITY N/A 04/30/2017   Procedure: Abdominal Aortogram w/Lower Extremity;  Surgeon: Elam Dutch, MD;  Location: [facility];  Service: Cardiovascular;  Laterality: N/A;  . CARDIAC CATHETERIZATION  12/28/2007   EF 75%. IT REVEALS NORMAL/SUPRANORMAL LEFT VENTRICULAR SYSTOLIC FUNCTION  . CARDIOVASCULAR STRESS TEST  03/25/2007   EF 78%  . CESAREAN SECTION  2003, 2009   x 2  . CHOLECYSTECTOMY  1993  . COLONOSCOPY  2014  . ENDOMETRIAL ABLATION  09/2010   and D&C  . HERNIA REPAIR  2330   umbilical  . LAPAROSCOPY    . PERIPHERAL VASCULAR BALLOON ANGIOPLASTY Left 04/30/2017   Procedure: Peripheral Vascular Balloon Angioplasty;  Surgeon: Elam Dutch, MD;  Location: [facility];  Service: Cardiovascular;  Laterality: Left;  SFA  . TUBAL LIGATION    . UMBILICAL  HERNIA REPAIR  2004  . US ECHOCARDIOGRAPHY  05/08/2008   EF 55-60%  . VENTRAL HERNIA REPAIR  06/15/2012   Procedure: LAPAROSCOPIC VENTRAL HERNIA;  Surgeon: Edward Jolly, MD;  Location: [facility];  Service: General;  Laterality: N/A;  LAPAROSCOPIC REPAIR VENTRAL HERNIA  . VIDEO BRONCHOSCOPY Bilateral 11/15/2017   Procedure: VIDEO BRONCHOSCOPY WITH FLUORO;  Surgeon: Collene Gobble, MD;  Location: [facility];  Service: Cardiopulmonary;  Laterality: Bilateral;    SOCIAL HISTORY: Social History   Tobacco Use  . Smoking status: Current Every Day Smoker    Packs/day: 1.00  Years: 25.00    Pack years: 25.00    Types: Cigarettes  . Smokeless tobacco: Never Used  Substance Use Topics  . Alcohol use: No    Alcohol/week: 0.0 oz  . Drug use: No    FAMILY HISTORY: Family History  Problem Relation Age of Onset  . Hypertension Mother   . Lung cancer Mother        47  . Diabetes Maternal Grandmother   . Heart disease Father        pacemaker or defibrillator  . Heart attack Paternal Uncle   . Breast cancer Unknown   . Colon cancer Neg Hx     ROS: Review of Systems  Constitutional: Negative for weight loss.  Respiratory: Negative for shortness of breath.   Cardiovascular: Negative for chest pain.  Endo/Heme/Allergies:       Negative hypoglycemia    PHYSICAL EXAM: Blood pressure (!) 143/89, pulse 70, temperature 98.4 F (36.9 C), temperature source Oral, height _0  (1.651 m), weight 267 lb (121.1 kg), SpO2 98 %. Body mass index is 44.43 kg/m. Physical Exam  Constitutional: She is oriented to person, place, and time. She appears well-developed and well-nourished.  Cardiovascular: Normal rate.  Pulmonary/Chest: Effort normal.  Musculoskeletal: Normal range of motion.  Neurological: She is oriented to person, place, and time.  Skin: Skin is warm and dry.  Psychiatric: She has a normal mood and affect. Her behavior is normal.  Vitals reviewed.   RECENT LABS AND  TESTS: BMET    Component Value Date/Time   NA 137 10/15/2017 0112   NA 138 04/22/2017 1145   K 3.2 (L) 10/15/2017 0112   CL 106 10/15/2017 0112   CO2 23 10/15/2017 0112   GLUCOSE 107 (H) 10/15/2017 0112   BUN 21 (H) 10/15/2017 0112   BUN 19 04/22/2017 1145   CREATININE 1.26 (H) 10/15/2017 0112   CREATININE 1.08 (H) 11/12/2016 1040   CALCIUM 9.7 10/15/2017 0112   GFRNONAA 47 (L) 10/15/2017 0112   GFRAA 55 (L) 10/15/2017 0112   Lab Results  Component Value Date   HGBA1C 6.3 07/30/2017   HGBA1C 7.0 (H) 04/22/2017   HGBA1C 7.7 (H) 03/11/2017   HGBA1C 6.2 02/13/2014   Lab Results  Component Value Date   INSULIN 19.6 04/22/2017   CBC    Component Value Date/Time   WBC 11.5 (H) 10/15/2017 0112   RBC 4.70 10/15/2017 0112   HGB 14.4 10/15/2017 0112   HGB 14.3 04/22/2017 1145   HCT 41.9 10/15/2017 0112   HCT 42.3 04/22/2017 1145   PLT 232 10/15/2017 0112   MCV 89.1 10/15/2017 0112   MCV 90 04/22/2017 1145   MCH 30.6 10/15/2017 0112   MCHC 34.4 10/15/2017 0112   RDW 13.7 10/15/2017 0112   RDW 13.9 04/22/2017 1145   LYMPHSABS 2.4 06/23/2017 1221   LYMPHSABS 2.5 04/22/2017 1145   MONOABS 0.5 06/23/2017 1221   EOSABS 0.1 06/23/2017 1221   EOSABS 0.1 04/22/2017 1145   BASOSABS 0.0 06/23/2017 1221   BASOSABS 0.0 04/22/2017 1145   Iron/TIBC/Ferritin/ %Sat No results found for: IRON, TIBC, FERRITIN, IRONPCTSAT Lipid Panel     Component Value Date/Time   CHOL 130 04/22/2017 1145   TRIG 141 04/22/2017 1145   HDL 25 (L) 04/22/2017 1145   CHOLHDL 6 05/26/2016 1134   VLDL 26.0 05/26/2016 1134   LDLCALC 77 04/22/2017 1145   Hepatic Function Panel     Component Value Date/Time   PROT 7.3 06/23/2017 1221  PROT 7.5 04/22/2017 1145   ALBUMIN 3.6 06/23/2017 1221   ALBUMIN 4.3 04/22/2017 1145   AST 17 06/23/2017 1221   ALT 18 06/23/2017 1221   ALKPHOS 85 06/23/2017 1221   BILITOT 0.6 06/23/2017 1221   BILITOT 0.4 04/22/2017 1145   BILIDIR 0.1 02/28/2016 1414       Component Value Date/Time   TSH 1.040 04/22/2017 1145   TSH 1.70 02/13/2014 1145   TSH 1.795 Test methodology is 3rd generation TSH 12/27/2007 1447    ASSESSMENT AND PLAN: Essential hypertension  Type 2 diabetes mellitus with other diabetic kidney complication, without long-term current use of insulin (HCC)  PLAN:  Hypertension We discussed sodium restriction, working on healthy weight loss, and a regular exercise program as the means to achieve improved blood pressure control. Courtnie agreed with this plan and agreed to follow up as directed. We will continue to monitor her blood pressure as well as her progress with the above lifestyle modifications. She will continue her medications as prescribed and will watch for signs of hypotension as she continues her lifestyle modifications. Aunesti agrees to follow up with our clinic in 2 weeks.  Diabetes II with Chronic Kidney Disease Velva has been given extensive diabetes education by myself today including ideal fasting and post-prandial blood glucose readings, individual ideal Hgb A1c goals and hypoglycemia prevention. We discussed the importance of good blood sugar control to decrease the likelihood of diabetic complications such as nephropathy, neuropathy, limb loss, blindness, coronary artery disease, and death. We discussed the importance of intensive lifestyle modification including diet, exercise and weight loss as the first line treatment for diabetes. Jacquelyne agrees to continue her diabetes medications as prescribed. Jatziry agrees to follow up with our clinic in 2 weeks.  We spent > than 50% of the 15 minute visit on the counseling as documented in the note.  Obesity Michalle is currently in the action stage of change. As such, her goal is to continue with weight loss efforts She has agreed to follow the Category 3 plan Shevelle has been instructed to work up to a goal of 150 minutes of combined cardio and strengthening exercise per week for  weight loss and overall health benefits. We discussed the following Behavioral Modification Strategies today: increasing lean protein intake, decrease liquid calories, and no skipping meals   Callen has agreed to follow up with our clinic in 2 weeks. She was informed of the importance of frequent follow up visits to maximize her success with intensive lifestyle modifications for her multiple health conditions.   OBESITY BEHAVIORAL INTERVENTION VISIT  Today's visit was # 12 out of 22.  Starting weight: 271 lbs Starting date: 04/22/17 Today's weight : 267 lbs  Today's date: 01/04/2018 Total lbs lost to date: 4 (Patients must lose 7 lbs in the first 6 months to continue with counseling)   ASK: We discussed the diagnosis of obesity with [name] today and Diondra agreed to give [name] permission to discuss obesity behavioral modification therapy today.  ASSESS: Melessa has the diagnosis of obesity and her BMI today is 44.43 Dotti is in the action stage of change   ADVISE: Jaquelyne was educated on the multiple health risks of obesity as well as the benefit of weight loss to improve her health. She was advised of the need for long term treatment and the importance of lifestyle modifications.  AGREE: Multiple dietary modification options and treatment options were discussed and  Avalina agreed to the above obesity treatment plan.  Wilhemena Durie, am acting as transcriptionist for Lacy Duverney, PA-C I, Lacy Duverney [facility], have reviewed this note and agree with its content.

## 2018-01-06 ENCOUNTER — Other Ambulatory Visit (INDEPENDENT_AMBULATORY_CARE_PROVIDER_SITE_OTHER): Payer: Self-pay

## 2018-01-06 ENCOUNTER — Telehealth: Payer: Self-pay | Admitting: Family Medicine

## 2018-01-06 ENCOUNTER — Telehealth (INDEPENDENT_AMBULATORY_CARE_PROVIDER_SITE_OTHER): Payer: Self-pay | Admitting: Physician Assistant

## 2018-01-06 DIAGNOSIS — Z794 Long term (current) use of insulin: Principal | ICD-10-CM

## 2018-01-06 DIAGNOSIS — E1121 Type 2 diabetes mellitus with diabetic nephropathy: Secondary | ICD-10-CM

## 2018-01-06 MED ORDER — LIRAGLUTIDE 18 MG/3ML ~~LOC~~ SOPN
1.2000 mg | PEN_INJECTOR | Freq: Every morning | SUBCUTANEOUS | 0 refills | Status: DC
Start: 2018-01-06 — End: 2018-02-08

## 2018-01-06 NOTE — Telephone Encounter (Signed)
Sent in a 90 day supply per [place]. Yechezkel Fertig, [place]

## 2018-01-06 NOTE — Telephone Encounter (Signed)
Insurance will only pay for 90 day supply for victoza , please resend [facility]AP [facility] [address]

## 2018-01-06 NOTE — Telephone Encounter (Signed)
Copied from [place]. Topic: Quick Communication - See Telephone Encounter >> Jan 06, 2018  9:08 AM Cleaster Corin, NT wrote: CRM for notification. See Telephone encounter for:   01/06/18. Pt. Calling to let Dr. Gwyndolyn Kaufman that her insurance will no longer cover her one touch diabetic supplies. Pt. Insurance will cover accu chek diabetes supplies. (lancets,test strips, and meter) pt. Can be reached at [phone]  [id number] [place] [place] [address] Phone: [phone] Fax: [phone]

## 2018-01-10 ENCOUNTER — Other Ambulatory Visit: Payer: Self-pay | Admitting: Cardiovascular Disease

## 2018-01-11 ENCOUNTER — Encounter (INDEPENDENT_AMBULATORY_CARE_PROVIDER_SITE_OTHER): Payer: Self-pay

## 2018-01-11 NOTE — Telephone Encounter (Signed)
Ms Lomanto stated that her pharmacy didn't receive a 90 day supply of Victoza.  She said that her insurance will not pay for anything less then 90 days.  She is out of the medication and this morning her sugars registered at 219.  Please send refill to [facility]. Thank you.

## 2018-01-11 NOTE — Telephone Encounter (Signed)
[facility] and sent the patient a my chart message to that affect. Lisa [name], Lisa [name]

## 2018-01-24 ENCOUNTER — Ambulatory Visit (INDEPENDENT_AMBULATORY_CARE_PROVIDER_SITE_OTHER): Payer: Managed Care, Other (non HMO) | Admitting: Family Medicine

## 2018-01-24 ENCOUNTER — Ambulatory Visit (INDEPENDENT_AMBULATORY_CARE_PROVIDER_SITE_OTHER): Payer: Managed Care, Other (non HMO) | Admitting: Physician Assistant

## 2018-01-24 ENCOUNTER — Encounter: Payer: Self-pay | Admitting: Family Medicine

## 2018-01-24 ENCOUNTER — Encounter (INDEPENDENT_AMBULATORY_CARE_PROVIDER_SITE_OTHER): Payer: Self-pay

## 2018-01-24 VITALS — BP 148/66 | HR 76 | Temp 99.2°F | Ht 65.0 in | Wt 271.8 lb

## 2018-01-24 DIAGNOSIS — R509 Fever, unspecified: Secondary | ICD-10-CM | POA: Diagnosis not present

## 2018-01-24 DIAGNOSIS — J101 Influenza due to other identified influenza virus with other respiratory manifestations: Secondary | ICD-10-CM

## 2018-01-24 DIAGNOSIS — J441 Chronic obstructive pulmonary disease with (acute) exacerbation: Secondary | ICD-10-CM | POA: Diagnosis not present

## 2018-01-24 LAB — POC INFLUENZA A&B (BINAX/QUICKVUE)
Influenza A, POC: POSITIVE — AB
Influenza B, POC: NEGATIVE

## 2018-01-24 MED ORDER — OSELTAMIVIR PHOSPHATE 75 MG PO CAPS
75.0000 mg | ORAL_CAPSULE | Freq: Two times a day (BID) | ORAL | 0 refills | Status: DC
Start: 2018-01-24 — End: 2018-02-11

## 2018-01-24 MED ORDER — PREDNISONE 50 MG PO TABS
50.0000 mg | ORAL_TABLET | Freq: Every day | ORAL | 0 refills | Status: DC
Start: 2018-01-24 — End: 2018-02-11

## 2018-01-24 NOTE — Patient Instructions (Addendum)
Schedule next available visit with me so we can go over your diabetes. Also need to recheck blood pressure when you are not ill.   Flu-like illness:  History and exam today are suggestive of viral process. Patients influenza test was positive.  Pretest probability of influenza was moderate. High risk condition: yes.   I am also concerned her COPD has flared up given her wheeze and mild shortness of breath- will treat this with prednisone  Patient will be treated with Tamiflu. Symptomatic treatment with: rest, hydration  Finally, we reviewed reasons to return to care including if symptoms worsen or persist or new concerns arise. She was advised to remain out of work the rest of the week. Also with symptoms worsening today and this potentially being start of the flu- discussed symptoms may worsen in next day or two and discussed reasons to go to the hospital.   Meds ordered this encounter  Medications  . oseltamivir (TAMIFLU) 75 MG capsule    Sig: Take 1 capsule (75 mg total) by mouth 2 (two) times daily.    Dispense:  10 capsule    Refill:  0  . predniSONE (DELTASONE) 50 MG tablet    Sig: Take 1 tablet (50 mg total) by mouth daily with breakfast.    Dispense:  5 tablet    Refill:  0

## 2018-01-24 NOTE — Progress Notes (Signed)
PCP: Marin Olp, MD  Subjective:  Lisa [name] is a 57 y.o. year old very pleasant female patient who presents with flu like symptoms including chills, cough, congestion, headaches -other symptoms: pain in sides from coughing, looser stools post nasal drip, fatigue.  - she also is having a mild increase in shortness of rbeath as well as wheeze. Thick white nose and cough. Normally wouldn't cough anything -started: 4 days ago- symptoms got far worse today (started with chills and subjective fevers) -inside 48 hour treatment window if needed for tamiflu: possibly (potential influenza starting today on top of URI starting a few days ago) -high risk condition (children <5, adults >65, chronic pulmonary or cardiac condition, immunosuppression, pregnancy, nursing home resident, morbid obesity) : yes -symptoms are worsening -previous treatments: acetaminophen - patient did  receive flu shot this year.  - positive sick contact- son; specifically influenza: no  ROS-denies SOB, NVD, sinus or dental pain  Pertinent Past Medical History-  Patient Active Problem List   Diagnosis Date Noted  . Type 2 diabetes mellitus ([place]) 03/11/2017    Priority: High  . PAD (peripheral artery disease) ([place]) 01/19/2017    Priority: High  . 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 Morbid obesity ([place]) 02/14/2016    Priority: High  . Tobacco use disorder 08/12/2015    Priority: High  . COPD (chronic obstructive pulmonary disease) ([place]) 01/18/2015    Priority: High  . Breast mass 08/03/2017    Priority: Medium  . GERD (gastroesophageal reflux disease) 02/14/2016    Priority: Medium  . DOE (dyspnea on exertion) 03/08/2014    Priority: Medium  . Migraine without aura 07/26/2013    Priority: Medium  . Sleep apnea 02/11/2012    Priority: Medium  . Chest discomfort 10/12/2011    Priority: Medium  . Hypertension associated with diabetes ([place])  10/12/2011    Priority: Medium  . Chronic hip pain 07/29/2017    Priority: Low  . Left knee pain 05/15/2016    Priority: Low  . Flank pain 04/21/2016    Priority: Low  . Abdominal pain 02/28/2016    Priority: Low  . Pulmonary nodule, right 04/11/2015    Priority: Low  . Mediastinal lymphadenopathy 01/18/2015    Priority: Low  . Essential hypertension 12/20/2017  . Pulmonary infiltrates   . S/P bronchoscopy with biopsy   . Hyperlipidemia associated with type 2 diabetes mellitus ([place]) 09/06/2017    Medications- reviewed  Current Outpatient Medications  Medication Sig Dispense Refill  . albuterol (PROVENTIL HFA;VENTOLIN HFA) 108 (90 Base) MCG/ACT inhaler Inhale 2 puffs into the lungs every 6 (six) hours as needed for wheezing or shortness of breath. 1 Inhaler 3  . aspirin EC 81 MG tablet Take 81 mg by mouth daily.    Marland Kitchen atorvastatin (LIPITOR) 40 MG tablet Take 1 tablet (40 mg total) by mouth daily. 90 tablet 3  . buPROPion (WELLBUTRIN SR) 150 MG 12 hr tablet Take 150 mg by mouth in the morning  0  . carvedilol (COREG) 25 MG tablet TAKE 1 TABLET (25 MG TOTAL) BY MOUTH 2 (TWO) TIMES DAILY. 180 tablet 3  . dicyclomine (BENTYL) 10 MG capsule Take 1 capsule (10 mg total) by mouth daily.    Marland Kitchen doxazosin (CARDURA) 2 MG tablet TAKE 1 TABLET (2 MG TOTAL) BY MOUTH 2 (TWO) TIMES DAILY. 180 tablet 2  . glucose blood (ONE TOUCH ULTRA TEST) test strip 1 each by Other  route as directed. Use one strip per test. Test blood sugars 1-4 times daily as instructed. 100 each 3  . Insulin Pen Needle 32G X 4 MM MISC 1 Package by Does not apply route daily at 12 noon. 100 each 0  . liraglutide (VICTOZA) 18 MG/3ML SOPN Inject 0.2 mLs (1.2 mg total) into the skin every morning. 15 mL 0  . losartan (COZAAR) 100 MG tablet TAKE 1 TABLET (100 MG TOTAL) BY MOUTH DAILY. 90 tablet 1  . Multiple Vitamin (MULTIVITAMIN WITH MINERALS) TABS tablet Take 1 tablet by mouth daily.    Marland Kitchen omeprazole (PRILOSEC) 40 MG capsule Take 1  capsule (40 mg total) by mouth daily.    . ONE TOUCH LANCETS MISC Use to check blood sugars twice a day 200 each 0  . spironolactone (ALDACTONE) 50 MG tablet TAKE 1 TABLET (50 MG TOTAL) BY MOUTH DAILY. 90 tablet 3  . linaclotide (LINZESS) 145 MCG CAPS capsule Take 145 mcg by mouth daily before breakfast.     No current facility-administered medications for this visit.     Objective: BP (!) 148/66 (BP Location: Left Arm, Patient Position: Sitting, Cuff Size: Large)   Pulse 76   Temp 99.2 F (37.3 C) (Oral)   Ht 5\' 5"  (1.651 m)   Wt 271 lb 12.8 oz (123.3 kg)   SpO2 96%   BMI 45.23 kg/m  Gen: NAD, appears fatigued, coughs fair amount during visit HEENT: Turbinates erythematous with clear discharge, TM normal, pharynx mildly erythematous with no tonsilar exudate or edema, no sinus tenderness CV: RRR no murmurs rubs or gallops Lungs: CTAB no crackles, wheeze, rhonchi Abdomen: soft/nontender/nondistended/normal bowel sounds.obese Skin: warm, dry, no rash  Results for orders placed or performed in visit on 01/24/18 (from the past 24 hour(s))  POC Influenza A&B(BINAX/QUICKVUE)     Status: Abnormal   Collection Time: 01/24/18  3:49 PM  Result Value Ref Range   Influenza A, POC Positive (A) Negative   Influenza B, POC Negative Negative    Assessment/Plan:  Flu-like illness:  History and exam today are suggestive of viral process. Patients influenza test was positive.  Pretest probability of influenza was moderate. High risk condition: yes.   I am also concerned her COPD has flared up given her wheeze and mild shortness of breath- will treat this with prednisone  Patient will be treated with Tamiflu. Symptomatic treatment with: rest, hydration  Finally, we reviewed reasons to return to care including if symptoms worsen or persist or new concerns arise. She was advised to remain out of work the rest of the week. Also with symptoms worsening today and this potentially being start of the  flu- discussed symptoms may worsen in next day or two and discussed reasons to go to the hospital.   Meds ordered this encounter  Medications  . oseltamivir (TAMIFLU) 75 MG capsule    Sig: Take 1 capsule (75 mg total) by mouth 2 (two) times daily.    Dispense:  10 capsule    Refill:  0  . predniSONE (DELTASONE) 50 MG tablet    Sig: Take 1 tablet (50 mg total) by mouth daily with breakfast.    Dispense:  5 tablet    Refill:  0    Garret Reddish, MD

## 2018-01-25 ENCOUNTER — Other Ambulatory Visit: Payer: Self-pay | Admitting: Family

## 2018-01-26 ENCOUNTER — Telehealth: Payer: Self-pay | Admitting: Family Medicine

## 2018-01-26 ENCOUNTER — Other Ambulatory Visit: Payer: Self-pay | Admitting: Family Medicine

## 2018-01-26 ENCOUNTER — Other Ambulatory Visit: Payer: Self-pay | Admitting: Gastroenterology

## 2018-01-26 NOTE — Telephone Encounter (Signed)
Copied from [place] [id number]. Topic: Quick Communication - Rx Refill/Question >> Jan 26, 2018  1:19 PM Celedonio Savage L wrote: Medication: losartan (COZAAR) 100 MG tablet   Patient is out of this medicine has been for 2 weeks now   Has the patient contacted their pharmacy? Yes.  Pharmacy called pt   (Agent: If no, request that the patient contact the pharmacy for the refill.)    Preferred Pharmacy (with phone number or street name):   [id number] [address], [place] [phone] (Phone) [phone] (Fax)     Agent: Please be advised that RX refills may take up to 3 business days. We ask that you follow-up with your pharmacy.

## 2018-01-26 NOTE — Telephone Encounter (Signed)
See note

## 2018-01-26 NOTE — Telephone Encounter (Signed)
Called and spoke with patient who states her concern is her cough. She states she is feeling better than she did a couple of days ago. I told her if her symptoms worsened or didn't improve by Friday to call back and we would see her. She states she hasn't had any fever. She verbalized understanding

## 2018-01-26 NOTE — Telephone Encounter (Signed)
Patient is completely out of the Losartan for 2 weeks now

## 2018-01-26 NOTE — Telephone Encounter (Signed)
No I said "Also with symptoms worsening today and this potentially being start of the flu- discussed symptoms may worsen in next day or two and discussed reasons to go to the hospital. " It is 2 more days in and as we discussed may have some worsening- if she is concerned about symptoms I suggest follow up visit with [name]. The antbiotic plan was before I knew she was flu positive- please apologize to her for any confusion

## 2018-01-26 NOTE — Telephone Encounter (Signed)
Copied from [place]. Topic: Quick Communication - See Telephone Encounter >> Jan 26, 2018  9:33 AM Ahmed Prima L wrote: CRM for notification. See Telephone encounter for:   01/26/18.  Patient said that she seed Dr Yong Channel Monday 2/25 & was diagnosed with the flu. She said that she is not feeling any better, he said to call if she did not. He said he would send her an antibiotic in. Mainly chest congestion  [id number] - [place], [place] - [address]

## 2018-01-27 MED ORDER — LOSARTAN POTASSIUM 100 MG PO TABS: 100.0000 mg | ORAL_TABLET | Freq: Every day | ORAL | 1 refills | Status: DC

## 2018-01-27 NOTE — Telephone Encounter (Signed)
LOV: 01/24/18  PCP: Dr. Yong Channel  [facility] [address]

## 2018-01-27 NOTE — Telephone Encounter (Signed)
See note

## 2018-01-28 ENCOUNTER — Other Ambulatory Visit: Payer: Self-pay

## 2018-01-28 MED ORDER — GLUCOSE BLOOD VI STRP: ORAL_STRIP | 3 refills | Status: DC

## 2018-01-28 MED ORDER — BLOOD GLUCOSE METER KIT: PACK | 0 refills | Status: DC

## 2018-02-01 ENCOUNTER — Encounter (INDEPENDENT_AMBULATORY_CARE_PROVIDER_SITE_OTHER): Payer: Self-pay

## 2018-02-08 ENCOUNTER — Ambulatory Visit (INDEPENDENT_AMBULATORY_CARE_PROVIDER_SITE_OTHER): Payer: Managed Care, Other (non HMO) | Admitting: Physician Assistant

## 2018-02-08 VITALS — BP 120/75 | HR 82 | Temp 98.4°F | Ht 65.0 in | Wt 263.0 lb

## 2018-02-08 DIAGNOSIS — I10 Essential (primary) hypertension: Secondary | ICD-10-CM | POA: Diagnosis not present

## 2018-02-08 DIAGNOSIS — Z6841 Body Mass Index (BMI) 40.0 and over, adult: Secondary | ICD-10-CM | POA: Diagnosis not present

## 2018-02-08 DIAGNOSIS — Z9189 Other specified personal risk factors, not elsewhere classified: Secondary | ICD-10-CM | POA: Diagnosis not present

## 2018-02-08 DIAGNOSIS — E1122 Type 2 diabetes mellitus with diabetic chronic kidney disease: Secondary | ICD-10-CM

## 2018-02-08 MED ORDER — METFORMIN HCL 500 MG PO TABS: 500.0000 mg | ORAL_TABLET | Freq: Two times a day (BID) | ORAL | 0 refills | Status: DC

## 2018-02-08 MED ORDER — ACCU-CHEK AVIVA DEVI: 0 refills | Status: DC

## 2018-02-08 MED ORDER — GLUCOSE BLOOD VI STRP: ORAL_STRIP | 0 refills | Status: DC

## 2018-02-08 MED ORDER — ACCU-CHEK SAFE-T PRO LANCETS MISC
0 refills | Status: DC
Start: 2018-02-08 — End: 2018-04-12

## 2018-02-09 NOTE — Progress Notes (Signed)
Office: [phone]  /  Fax: [phone]   HPI:   Chief Complaint: OBESITY Lisa [name] is here to discuss her progress with her obesity treatment plan. She is on the Category 3 plan and is following her eating plan approximately 70 % of the time. She states she is doing stairs and walking for 20 minutes 5 times per week. [name] continues to do well with weight loss. She is back on the meal plan and states her hunger is well controlled.  Her weight is 263 lb (119.3 kg) today and has had a weight loss of 4 pounds over a period of 5 weeks since her last visit. She has lost 8 lbs since starting treatment with [name].  Diabetes II non insulin with CKD Lisa [name] has a diagnosis of diabetes type II. Lisa [name] is not checking blood sugar at home and denies any hypoglycemic episodes. She has been out of her medications. She has been working on intensive lifestyle modifications including diet, exercise, and weight loss to help control her blood glucose levels.  Hypertension Lisa [name] is a 57 y.o. female with hypertension and is now back on her medications. [name] denies chest pain or shortness of breath on exertion. She is working weight loss to help control her blood pressure with the goal of decreasing her risk of heart attack and stroke. Lisa [name] blood pressure is stable.  ALLERGIES: Allergies  Allergen Reactions  . Almond Oil Anaphylaxis and Nausea And Vomiting    Almond-Nuts Only  . Apple Anaphylaxis  . Ceftin [Cefuroxime] Other (See Comments)    Severe stomach pain - Diverticulitis flares up  . Cherry Extract Anaphylaxis    Cherry fruit only   . Other Anaphylaxis and Itching    Most fruits- Are NOT tolerated (PLEASE ASK PATIENT BEFORE GIVING, AS CERTAIN FRUITS ARE TOLERATED IN LIMITED QUANTITIES!!)  . Guaifenesin & Derivatives Other (See Comments)    Restlessness, jittery  . Hctz [Hydrochlorothiazide] Other (See Comments)    Causes a headache  . Morphine And Related Other (See Comments)      Moodiness.  Causes anger.  . Naproxen Other (See Comments)    Insomnia  . Lisinopril Cough         MEDICATIONS: Current Outpatient Medications on File Prior to Visit  Medication Sig Dispense Refill  . albuterol (PROVENTIL HFA;VENTOLIN HFA) 108 (90 Base) MCG/ACT inhaler Inhale 2 puffs into the lungs every 6 (six) hours as needed for wheezing or shortness of breath. 1 Inhaler 3  . aspirin EC 81 MG tablet Take 81 mg by mouth daily.    Marland Kitchen atorvastatin (LIPITOR) 40 MG tablet Take 1 tablet (40 mg total) by mouth daily. 90 tablet 3  . blood glucose meter kit and supplies Use to check blood sugar twice a day. 1 each 0  . buPROPion (WELLBUTRIN SR) 150 MG 12 hr tablet Take 150 mg by mouth in the morning  0  . carvedilol (COREG) 25 MG tablet TAKE 1 TABLET (25 MG TOTAL) BY MOUTH 2 (TWO) TIMES DAILY. 180 tablet 3  . dicyclomine (BENTYL) 10 MG capsule Take 1 capsule (10 mg total) by mouth daily.    Marland Kitchen doxazosin (CARDURA) 2 MG tablet TAKE 1 TABLET (2 MG TOTAL) BY MOUTH 2 (TWO) TIMES DAILY. 180 tablet 2  . glucose blood test strip Use to check blood sugar twice a day 200 each 3  . Insulin Pen Needle 32G X 4 MM MISC 1 Package by Does not apply route daily at 12  noon. 100 each 0  . linaclotide (LINZESS) 145 MCG CAPS capsule Take 145 mcg by mouth daily before breakfast.    . losartan (COZAAR) 100 MG tablet Take 1 tablet (100 mg total) by mouth daily. 90 tablet 1  . Multiple Vitamin (MULTIVITAMIN WITH MINERALS) TABS tablet Take 1 tablet by mouth daily.    Marland Kitchen omeprazole (PRILOSEC) 40 MG capsule Take 1 capsule (40 mg total) by mouth daily.    . ONE TOUCH LANCETS MISC Use to check blood sugars twice a day 200 each 0  . oseltamivir (TAMIFLU) 75 MG capsule Take 1 capsule (75 mg total) by mouth 2 (two) times daily. 10 capsule 0  . predniSONE (DELTASONE) 50 MG tablet Take 1 tablet (50 mg total) by mouth daily with breakfast. 5 tablet 0  . spironolactone (ALDACTONE) 50 MG tablet TAKE 1 TABLET (50 MG TOTAL) BY MOUTH  DAILY. 90 tablet 3   No current facility-administered medications on file prior to visit.     PAST MEDICAL HISTORY: Past Medical History:  Diagnosis Date  . Allergic rhinitis   . Asthma   . Breast nodule    right breast, to see dr Helane Rima 06-20-2012 for   . Chest pain, non-cardiac   . Chronic headaches   . Cigarette smoker   . Complication of anesthesia 05-17-2012   trouble breathing after colonscopy, needed nebulizer  . Diverticulosis   . GERD (gastroesophageal reflux disease)   . Hepatic hemangioma   . Hypertension   . IBS (irritable bowel syndrome)   . Obesity   . Prosthetic eye globe   . Pyloric erosion   . Sleep apnea    CPAP setting varies from 4-10  . Type 2 diabetes mellitus ([place]) 03/11/2017    PAST SURGICAL HISTORY: Past Surgical History:  Procedure Laterality Date  . ABDOMINAL AORTOGRAM W/LOWER EXTREMITY N/A 04/30/2017   Procedure: Abdominal Aortogram w/Lower Extremity;  Surgeon: Elam Dutch, MD;  Location: [facility];  Service: Cardiovascular;  Laterality: N/A;  . CARDIAC CATHETERIZATION  12/28/2007   EF 75%. IT REVEALS NORMAL/SUPRANORMAL LEFT VENTRICULAR SYSTOLIC FUNCTION  . CARDIOVASCULAR STRESS TEST  03/25/2007   EF 78%  . CESAREAN SECTION  2003, 2009   x 2  . CHOLECYSTECTOMY  1993  . COLONOSCOPY  2014  . ENDOMETRIAL ABLATION  09/2010   and D&C  . HERNIA REPAIR  9242   umbilical  . LAPAROSCOPY    . PERIPHERAL VASCULAR BALLOON ANGIOPLASTY Left 04/30/2017   Procedure: Peripheral Vascular Balloon Angioplasty;  Surgeon: Elam Dutch, MD;  Location: [facility];  Service: Cardiovascular;  Laterality: Left;  SFA  . TUBAL LIGATION    . UMBILICAL HERNIA REPAIR  2004  . US ECHOCARDIOGRAPHY  05/08/2008   EF 55-60%  . VENTRAL HERNIA REPAIR  06/15/2012   Procedure: LAPAROSCOPIC VENTRAL HERNIA;  Surgeon: Edward Jolly, MD;  Location: [facility];  Service: General;  Laterality: N/A;  LAPAROSCOPIC REPAIR VENTRAL HERNIA  . VIDEO BRONCHOSCOPY  Bilateral 11/15/2017   Procedure: VIDEO BRONCHOSCOPY WITH FLUORO;  Surgeon: Collene Gobble, MD;  Location: [facility];  Service: Cardiopulmonary;  Laterality: Bilateral;    SOCIAL HISTORY: Social History   Tobacco Use  . Smoking status: Current Every Day Smoker    Packs/day: 1.00    Years: 25.00    Pack years: 25.00    Types: Cigarettes  . Smokeless tobacco: Never Used  Substance Use Topics  . Alcohol use: No    Alcohol/week: 0.0 oz  .  Drug use: No    FAMILY HISTORY: Family History  Problem Relation Age of Onset  . Hypertension Mother   . Lung cancer Mother        71  . Diabetes Maternal Grandmother   . Heart disease Father        pacemaker or defibrillator  . Heart attack Paternal Uncle   . Breast cancer Unknown   . Colon cancer Neg Hx     ROS: Review of Systems  Constitutional: Positive for weight loss.  Respiratory: Negative for shortness of breath (on exertion).   Cardiovascular: Negative for chest pain.  Endo/Heme/Allergies:       Negative for hypoglcyemia    PHYSICAL EXAM: Blood pressure 120/75, pulse 82, temperature 98.4 F (36.9 C), temperature source Oral, height _0  (1.651 m), weight 263 lb (119.3 kg), SpO2 97 %. Body mass index is 43.77 kg/m. Physical Exam  Constitutional: She is oriented to person, place, and time. She appears well-developed and well-nourished.  Cardiovascular: Normal rate.  Pulmonary/Chest: Effort normal.  Musculoskeletal: Normal range of motion.  Neurological: She is oriented to person, place, and time.  Skin: Skin is warm and dry.  Psychiatric: She has a normal mood and affect. Her behavior is normal.  Vitals reviewed.   RECENT LABS AND TESTS: BMET    Component Value Date/Time   NA 137 10/15/2017 0112   NA 138 04/22/2017 1145   K 3.2 (L) 10/15/2017 0112   CL 106 10/15/2017 0112   CO2 23 10/15/2017 0112   GLUCOSE 107 (H) 10/15/2017 0112   BUN 21 (H) 10/15/2017 0112   BUN 19 04/22/2017 1145   CREATININE 1.26 (H)  10/15/2017 0112   CREATININE 1.08 (H) 11/12/2016 1040   CALCIUM 9.7 10/15/2017 0112   GFRNONAA 47 (L) 10/15/2017 0112   GFRAA 55 (L) 10/15/2017 0112   Lab Results  Component Value Date   HGBA1C 6.3 07/30/2017   HGBA1C 7.0 (H) 04/22/2017   HGBA1C 7.7 (H) 03/11/2017   HGBA1C 6.2 02/13/2014   Lab Results  Component Value Date   INSULIN 19.6 04/22/2017   CBC    Component Value Date/Time   WBC 11.5 (H) 10/15/2017 0112   RBC 4.70 10/15/2017 0112   HGB 14.4 10/15/2017 0112   HGB 14.3 04/22/2017 1145   HCT 41.9 10/15/2017 0112   HCT 42.3 04/22/2017 1145   PLT 232 10/15/2017 0112   MCV 89.1 10/15/2017 0112   MCV 90 04/22/2017 1145   MCH 30.6 10/15/2017 0112   MCHC 34.4 10/15/2017 0112   RDW 13.7 10/15/2017 0112   RDW 13.9 04/22/2017 1145   LYMPHSABS 2.4 06/23/2017 1221   LYMPHSABS 2.5 04/22/2017 1145   MONOABS 0.5 06/23/2017 1221   EOSABS 0.1 06/23/2017 1221   EOSABS 0.1 04/22/2017 1145   BASOSABS 0.0 06/23/2017 1221   BASOSABS 0.0 04/22/2017 1145   Iron/TIBC/Ferritin/ %Sat No results found for: IRON, TIBC, FERRITIN, IRONPCTSAT Lipid Panel     Component Value Date/Time   CHOL 130 04/22/2017 1145   TRIG 141 04/22/2017 1145   HDL 25 (L) 04/22/2017 1145   CHOLHDL 6 05/26/2016 1134   VLDL 26.0 05/26/2016 1134   LDLCALC 77 04/22/2017 1145   Hepatic Function Panel     Component Value Date/Time   PROT 7.3 06/23/2017 1221   PROT 7.5 04/22/2017 1145   ALBUMIN 3.6 06/23/2017 1221   ALBUMIN 4.3 04/22/2017 1145   AST 17 06/23/2017 1221   ALT 18 06/23/2017 1221   ALKPHOS 85 06/23/2017 1221  BILITOT 0.6 06/23/2017 1221   BILITOT 0.4 04/22/2017 1145   BILIDIR 0.1 02/28/2016 1414      Component Value Date/Time   TSH 1.040 04/22/2017 1145   TSH 1.70 02/13/2014 1145   TSH 1.795 Test methodology is 3rd generation TSH 12/27/2007 1447    ASSESSMENT AND PLAN: Type 2 diabetes mellitus with chronic kidney disease, without long-term current use of insulin, unspecified CKD  stage ([place]) - Plan: Blood Glucose Monitoring Suppl (ACCU-CHEK AVIVA) device, metFORMIN (GLUCOPHAGE) 500 MG tablet, Lancets (ACCU-CHEK SAFE-T PRO) lancets, glucose blood (ACCU-CHEK AVIVA) test strip  Essential hypertension  At risk for heart disease  Class 3 severe obesity with serious comorbidity and body mass index (BMI) of 40.0 to 44.9 in adult, unspecified obesity type (HCC)  PLAN:  Diabetes II non insulin with CKD Nancy has been given extensive diabetes education by myself today including ideal fasting and post-prandial blood glucose readings, individual ideal Hgb A1c goals and hypoglycemia prevention. We discussed the importance of good blood sugar control to decrease the likelihood of diabetic complications such as nephropathy, neuropathy, limb loss, blindness, coronary artery disease, and death. We discussed the importance of intensive lifestyle modification including diet, exercise and weight loss as the first line treatment for diabetes. Amily agrees to start Metformin 500 mg qd #30 with no refills and stop Victoza. Prescription was written today for Accu Chek meter with lancets and strips. Wing agreed to follow up at the agreed upon time.  Hypertension We discussed sodium restriction, working on healthy weight loss, and a regular exercise program as the means to achieve improved blood pressure control. Alonia agreed with this plan and agreed to follow up as directed. We will continue to monitor her blood pressure as well as her progress with the above lifestyle modifications. She will continue her medications as prescribed and will watch for signs of hypotension as she continues her lifestyle modifications.  Obesity Alandria is currently in the action stage of change. As such, her goal is to continue with weight loss efforts She has agreed to follow the Category 3 plan Tyona has been instructed to work up to a goal of 150 minutes of combined cardio and strengthening exercise per week for  weight loss and overall health benefits. We discussed the following Behavioral Modification Strategies today: increasing lean protein intake and keeping healthy foods in the home  Zaelynn has agreed to follow up with our clinic in 2 weeks. She was informed of the importance of frequent follow up visits to maximize her success with intensive lifestyle modifications for her multiple health conditions.   OBESITY BEHAVIORAL INTERVENTION VISIT  Today's visit was # 13 out of 22.  Starting weight: 271 lbs Starting date: 04/22/17 Today's weight : 263 lbs  Today's date: 02/09/2018 Total lbs lost to date: 8 (Patients must lose 7 lbs in the first 6 months to continue with counseling)   ASK: We discussed the diagnosis of obesity with [name] today and Natalyia agreed to give [name] permission to discuss obesity behavioral modification therapy today.  ASSESS: Emylie has the diagnosis of obesity and her BMI today is 43.77 Katasha is in the action stage of change   ADVISE: Elease was educated on the multiple health risks of obesity as well as the benefit of weight loss to improve her health. She was advised of the need for long term treatment and the importance of lifestyle modifications.  AGREE: Multiple dietary modification options and treatment options were discussed and  Athaliah agreed  to the above obesity treatment plan.   Corey Skains, am acting as transcriptionist for [employer], PA-C I, [facility], have reviewed this note and agree with its content

## 2018-02-11 ENCOUNTER — Ambulatory Visit (INDEPENDENT_AMBULATORY_CARE_PROVIDER_SITE_OTHER)
Admission: RE | Admit: 2018-02-11 | Discharge: 2018-02-11 | Disposition: A | Discharge status: Home / Self Care | Payer: Managed Care, Other (non HMO) | Source: Ambulatory Visit | Attending: Family | Admitting: Family

## 2018-02-11 ENCOUNTER — Other Ambulatory Visit: Payer: Self-pay

## 2018-02-11 ENCOUNTER — Encounter: Payer: Self-pay | Admitting: Family

## 2018-02-11 ENCOUNTER — Ambulatory Visit (HOSPITAL_COMMUNITY)
Admission: RE | Admit: 2018-02-11 | Discharge: 2018-02-11 | Disposition: A | Discharge status: Home / Self Care | Payer: Managed Care, Other (non HMO) | Source: Ambulatory Visit | Attending: Family | Admitting: Family

## 2018-02-11 ENCOUNTER — Ambulatory Visit (INDEPENDENT_AMBULATORY_CARE_PROVIDER_SITE_OTHER): Payer: Managed Care, Other (non HMO) | Admitting: Family

## 2018-02-11 VITALS — BP 127/83 | HR 73 | Temp 98.2°F | Resp 20 | Ht 65.0 in | Wt 269.0 lb

## 2018-02-11 DIAGNOSIS — I739 Peripheral vascular disease, unspecified: Secondary | ICD-10-CM | POA: Diagnosis not present

## 2018-02-11 DIAGNOSIS — Z9862 Peripheral vascular angioplasty status: Secondary | ICD-10-CM

## 2018-02-11 DIAGNOSIS — E1151 Type 2 diabetes mellitus with diabetic peripheral angiopathy without gangrene: Secondary | ICD-10-CM | POA: Insufficient documentation

## 2018-02-11 DIAGNOSIS — I70202 Unspecified atherosclerosis of native arteries of extremities, left leg: Secondary | ICD-10-CM | POA: Diagnosis not present

## 2018-02-11 DIAGNOSIS — E785 Hyperlipidemia, unspecified: Secondary | ICD-10-CM | POA: Insufficient documentation

## 2018-02-11 DIAGNOSIS — F17200 Nicotine dependence, unspecified, uncomplicated: Secondary | ICD-10-CM | POA: Diagnosis not present

## 2018-02-11 DIAGNOSIS — I10 Essential (primary) hypertension: Secondary | ICD-10-CM | POA: Insufficient documentation

## 2018-02-11 DIAGNOSIS — Z48812 Encounter for surgical aftercare following surgery on the circulatory system: Secondary | ICD-10-CM | POA: Diagnosis not present

## 2018-02-11 NOTE — Patient Instructions (Addendum)
Steps to Quit Smoking Smoking tobacco can be bad for your health. It can also affect almost every organ in your body. Smoking puts you and people around you at risk for many serious long-lasting (chronic) diseases. Quitting smoking is hard, but it is one of the best things that you can do for your health. It is never too late to quit. What are the benefits of quitting smoking? When you quit smoking, you lower your risk for getting serious diseases and conditions. They can include:  Lung cancer or lung disease.  Heart disease.  Stroke.  Heart attack.  Not being able to have children (infertility).  Weak bones (osteoporosis) and broken bones (fractures).  If you have coughing, wheezing, and shortness of breath, those symptoms may get better when you quit. You may also get sick less often. If you are pregnant, quitting smoking can help to lower your chances of having a baby of low birth weight. What can I do to help me quit smoking? Talk with your doctor about what can help you quit smoking. Some things you can do (strategies) include:  Quitting smoking totally, instead of slowly cutting back how much you smoke over a period of time.  Going to in-person counseling. You are more likely to quit if you go to many counseling sessions.  Using resources and support systems, such as: ? Online chats with a counselor. ? Phone quitlines. ? Printed self-help materials. ? Support groups or group counseling. ? Text messaging programs. ? Mobile phone apps or applications.  Taking medicines. Some of these medicines may have nicotine in them. If you are pregnant or breastfeeding, do not take any medicines to quit smoking unless your doctor says it is okay. Talk with your doctor about counseling or other things that can help you.  Talk with your doctor about using more than one strategy at the same time, such as taking medicines while you are also going to in-person counseling. This can help make  quitting easier. What things can I do to make it easier to quit? Quitting smoking might feel very hard at first, but there is a lot that you can do to make it easier. Take these steps:  Talk to your family and friends. Ask them to support and encourage you.  Call phone quitlines, reach out to support groups, or work with a counselor.  Ask people who smoke to not smoke around you.  Avoid places that make you want (trigger) to smoke, such as: ? Bars. ? Parties. ? Smoke-break areas at work.  Spend time with people who do not smoke.  Lower the stress in your life. Stress can make you want to smoke. Try these things to help your stress: ? Getting regular exercise. ? Deep-breathing exercises. ? Yoga. ? Meditating. ? Doing a body scan. To do this, close your eyes, focus on one area of your body at a time from head to toe, and notice which parts of your body are tense. Try to relax the muscles in those areas.  Download or buy apps on your mobile phone or tablet that can help you stick to your quit plan. There are many free apps, such as QuitGuide from the CDC (Centers for Disease Control and Prevention). You can find more support from smokefree.gov and other websites.  This information is not intended to replace advice given to you by your health care provider. Make sure you discuss any questions you have with your health care provider. Document Released: 09/12/2009 Document   Revised: 07/14/2016 Document Reviewed: 04/02/2015 Elsevier Interactive Patient Education  2018 Elsevier Inc.     Peripheral Vascular Disease Peripheral vascular disease (PVD) is a disease of the blood vessels that are not part of your heart and brain. A simple term for PVD is poor circulation. In most cases, PVD narrows the blood vessels that carry blood from your heart to the rest of your body. This can result in a decreased supply of blood to your arms, legs, and internal organs, like your stomach or kidneys.  However, it most often affects a person's lower legs and feet. There are two types of PVD.  Organic PVD. This is the more common type. It is caused by damage to the structure of blood vessels.  Functional PVD. This is caused by conditions that make blood vessels contract and tighten (spasm).  Without treatment, PVD tends to get worse over time. PVD can also lead to acute ischemic limb. This is when an arm or limb suddenly has trouble getting enough blood. This is a medical emergency. Follow these instructions at home:  Take medicines only as told by your doctor.  Do not use any tobacco products, including cigarettes, chewing tobacco, or electronic cigarettes. If you need help quitting, ask your doctor.  Lose weight if you are overweight, and maintain a healthy weight as told by your doctor.  Eat a diet that is low in fat and cholesterol. If you need help, ask your doctor.  Exercise regularly. Ask your doctor for some good activities for you.  Take good care of your feet. ? Wear comfortable shoes that fit well. ? Check your feet often for any cuts or sores. Contact a doctor if:  You have cramps in your legs while walking.  You have leg pain when you are at rest.  You have coldness in a leg or foot.  Your skin changes.  You are unable to get or have an erection (erectile dysfunction).  You have cuts or sores on your feet that are not healing. Get help right away if:  Your arm or leg turns cold and blue.  Your arms or legs become red, warm, swollen, painful, or numb.  You have chest pain or trouble breathing.  You suddenly have weakness in your face, arm, or leg.  You become very confused or you cannot speak.  You suddenly have a very bad headache.  You suddenly cannot see. This information is not intended to replace advice given to you by your health care provider. Make sure you discuss any questions you have with your health care provider. Document Released:  02/10/2010 Document Revised: 04/23/2016 Document Reviewed: 04/26/2014 Elsevier Interactive Patient Education  2017 Elsevier Inc.  

## 2018-02-11 NOTE — Progress Notes (Signed)
[facility] [place]   CC: Follow up peripheral artery occlusive disease   History of Present Illness Lisa [name] is a 57 y.o. female returns for follow-up today for peripheral arterial disease. She is s/p angioplasty of a left superficial femoral artery stenosis on 04-30-17 by Dr. Oneida Alar for claudication symptoms. She states her left leg is much improved. She still complains of some pain in her back with ambulation. She has known lumbar spine disc disease. She took Plavix until September 2018 as advised.   About 3/4 up a hill she walks to get from her car to the door of her job she has right calf claudication, relieved by rest. Since she changed jobs, she has to climb this hill, that's when she noticed it. Her previous job was sedentary with no inclines to walk, no claudication noticed then. She indicates minor claudication issues with left calf.    Dr. Oneida Alar last evaluated pt on 08-12-17. At that time duplex ultrasound of her left lower extremity showed a widely patent left superficial femoral artery. ABI on the left was 0.94 right was 0.91 The patient was on aspirin. She was wishing to start back on ibuprofen because of her degenerative arthritis problems. She was to follow-up in 3 months for repeat duplex of her left leg as well as bilateral ABIs.   She denies any history of stroke or MI.  She had an event in which she briefly could not speak, was evaluated, and states she was told that she likely had a migraine with no pain and no aura.    Diabetic: Yes, last A1C results on file was 6.3 on 07-30-17 Tobacco use: smoker  (1/2 ppd, decreased from 2-3 ppd, started in late teens), is taking Wellbutrin which has decreased her craving  Pt meds include: Statin :Yes Betablocker: yes ASA: Yes, 81 mg, had gastric pain with 325 mg daily, has no problems with 81 mg daily. She denies any GI bleed hx, denies GI ulcers hx, denies black stools, denies any other bleeding  issue Other anticoagulants/antiplatelets: no     Past Medical History:  Diagnosis Date  . Allergic rhinitis   . Asthma   . Breast nodule    right breast, to see dr Helane Rima 06-20-2012 for   . Chest pain, non-cardiac   . Chronic headaches   . Cigarette smoker   . Complication of anesthesia 05-17-2012   trouble breathing after colonscopy, needed nebulizer  . Diverticulosis   . GERD (gastroesophageal reflux disease)   . Hepatic hemangioma   . Hypertension   . IBS (irritable bowel syndrome)   . Obesity   . Prosthetic eye globe   . Pyloric erosion   . Sleep apnea    CPAP setting varies from 4-10  . Type 2 diabetes mellitus ([place]) 03/11/2017    Social History Social History   Tobacco Use  . Smoking status: Current Every Day Smoker    Years: 25.00    Types: Cigarettes  . Smokeless tobacco: Never Used  . Tobacco comment: 10 cigarettes per day  Substance Use Topics  . Alcohol use: No    Alcohol/week: 0.0 oz  . Drug use: No    Family History Family History  Problem Relation Age of Onset  . Hypertension Mother   . Lung cancer Mother        48  . Diabetes Maternal Grandmother   . Heart disease Father        pacemaker or defibrillator  . Heart  attack Paternal Uncle   . Breast cancer Unknown   . Colon cancer Neg Hx     Past Surgical History:  Procedure Laterality Date  . ABDOMINAL AORTOGRAM W/LOWER EXTREMITY N/A 04/30/2017   Procedure: Abdominal Aortogram w/Lower Extremity;  Surgeon: Elam Dutch, MD;  Location: [facility];  Service: Cardiovascular;  Laterality: N/A;  . CARDIAC CATHETERIZATION  12/28/2007   EF 75%. IT REVEALS NORMAL/SUPRANORMAL LEFT VENTRICULAR SYSTOLIC FUNCTION  . CARDIOVASCULAR STRESS TEST  03/25/2007   EF 78%  . CESAREAN SECTION  2003, 2009   x 2  . CHOLECYSTECTOMY  1993  . COLONOSCOPY  2014  . ENDOMETRIAL ABLATION  09/2010   and D&C  . HERNIA REPAIR  2130   umbilical  . LAPAROSCOPY    . PERIPHERAL VASCULAR BALLOON ANGIOPLASTY Left  04/30/2017   Procedure: Peripheral Vascular Balloon Angioplasty;  Surgeon: Elam Dutch, MD;  Location: [facility];  Service: Cardiovascular;  Laterality: Left;  SFA  . TUBAL LIGATION    . UMBILICAL HERNIA REPAIR  2004  . US ECHOCARDIOGRAPHY  05/08/2008   EF 55-60%  . VENTRAL HERNIA REPAIR  06/15/2012   Procedure: LAPAROSCOPIC VENTRAL HERNIA;  Surgeon: Edward Jolly, MD;  Location: [facility];  Service: General;  Laterality: N/A;  LAPAROSCOPIC REPAIR VENTRAL HERNIA  . VIDEO BRONCHOSCOPY Bilateral 11/15/2017   Procedure: VIDEO BRONCHOSCOPY WITH FLUORO;  Surgeon: Collene Gobble, MD;  Location: [facility];  Service: Cardiopulmonary;  Laterality: Bilateral;    Allergies  Allergen Reactions  . Almond Oil Anaphylaxis and Nausea And Vomiting    Almond-Nuts Only  . Apple Anaphylaxis  . Ceftin [Cefuroxime] Other (See Comments)    Severe stomach pain - Diverticulitis flares up  . Cherry Extract Anaphylaxis    Cherry fruit only   . Other Anaphylaxis and Itching    Most fruits- Are NOT tolerated (PLEASE ASK PATIENT BEFORE GIVING, AS CERTAIN FRUITS ARE TOLERATED IN LIMITED QUANTITIES!!)  . Guaifenesin & Derivatives Other (See Comments)    Restlessness, jittery  . Hctz [Hydrochlorothiazide] Other (See Comments)    Causes a headache  . Morphine And Related Other (See Comments)    Moodiness.  Causes anger.  . Naproxen Other (See Comments)    Insomnia  . Lisinopril Cough         Current Outpatient Medications  Medication Sig Dispense Refill  . albuterol (PROVENTIL HFA;VENTOLIN HFA) 108 (90 Base) MCG/ACT inhaler Inhale 2 puffs into the lungs every 6 (six) hours as needed for wheezing or shortness of breath. 1 Inhaler 3  . aspirin EC 81 MG tablet Take 81 mg by mouth daily.    Marland Kitchen atorvastatin (LIPITOR) 40 MG tablet Take 1 tablet (40 mg total) by mouth daily. 90 tablet 3  . blood glucose meter kit and supplies Use to check blood sugar twice a day. 1 each 0  . Blood Glucose  Monitoring Suppl (ACCU-CHEK AVIVA) device Use as instructed 1 each 0  . buPROPion (WELLBUTRIN SR) 150 MG 12 hr tablet Take 150 mg by mouth in the morning  0  . carvedilol (COREG) 25 MG tablet TAKE 1 TABLET (25 MG TOTAL) BY MOUTH 2 (TWO) TIMES DAILY. 180 tablet 3  . dicyclomine (BENTYL) 10 MG capsule Take 1 capsule (10 mg total) by mouth daily.    Marland Kitchen doxazosin (CARDURA) 2 MG tablet TAKE 1 TABLET (2 MG TOTAL) BY MOUTH 2 (TWO) TIMES DAILY. 180 tablet 2  . glucose blood (ACCU-CHEK AVIVA) test strip Use as  instructed 100 each 0  . glucose blood test strip Use to check blood sugar twice a day 200 each 3  . Insulin Pen Needle 32G X 4 MM MISC 1 Package by Does not apply route daily at 12 noon. 100 each 0  . Lancets (ACCU-CHEK SAFE-T PRO) lancets Use as instructed 100 each 0  . linaclotide (LINZESS) 145 MCG CAPS capsule Take 145 mcg by mouth daily before breakfast.    . losartan (COZAAR) 100 MG tablet Take 1 tablet (100 mg total) by mouth daily. 90 tablet 1  . metFORMIN (GLUCOPHAGE) 500 MG tablet Take 1 tablet (500 mg total) by mouth 2 (two) times daily with a meal. 60 tablet 0  . Multiple Vitamin (MULTIVITAMIN WITH MINERALS) TABS tablet Take 1 tablet by mouth daily.    Marland Kitchen omeprazole (PRILOSEC) 40 MG capsule Take 1 capsule (40 mg total) by mouth daily.    . ONE TOUCH LANCETS MISC Use to check blood sugars twice a day 200 each 0  . spironolactone (ALDACTONE) 50 MG tablet TAKE 1 TABLET (50 MG TOTAL) BY MOUTH DAILY. 90 tablet 3   No current facility-administered medications for this visit.     ROS: See HPI for pertinent positives and negatives.   Physical Examination  Vitals:   02/11/18 1556 02/11/18 1601  BP: 128/76 127/83  Pulse: 73   Resp: 20   Temp: 98.2 F (36.8 C)   TempSrc: Oral   SpO2: 93%   Weight: 269 lb (122 kg)   Height: '5\' 5"'$  (1.651 m)    Body mass index is 44.76 kg/m.  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  Neck: large neck, Acanthosis nigricans encircling neck Pulmonary: Respirations are non labored, CTAB, distant breath sounds in all fields Cardiac: regular rhythm and rate, no detected murmur.         Carotid Bruits Right Left   Negative Negative   Radial pulses are 2+ palpable bilaterally   Adominal aortic pulse is not palpable                         VASCULAR EXAM: Extremities without ischemic changes, without Gangrene; without open wounds. Several thick toenails                                                                                                                                                        LE Pulses Right Left       FEMORAL  not palpable (large panus)  1+ palpable        POPLITEAL  not palpable   not palpable       POSTERIOR TIBIAL  not palpable   2+ palpable  DORSALIS PEDIS      ANTERIOR TIBIAL 2+ palpable  2+ palpable    Abdomen: soft, NT, large asymptomatic reducible ventral hernia, large panus. Skin: no rashes, no cellulitis, no ulcers noted. Musculoskeletal: no muscle wasting or atrophy.  Neurologic: A&O X 3; appropriate affect, Sensation is normal; MOTOR FUNCTION:  moving all extremities equally, motor strength 5/5 throughout. Speech is fluent/normal. CN 2-12 intact. Psychiatric: Thought content is normal, mood appropriate for clinical situation.     ASSESSMENT: PAHOLA DIMMITT is a 57 y.o. female who is s/p angioplasty of a left superficial femoral artery stenosis on 04-30-17.  Recently she started a new job in which she walks more, has to walk up an incline from her parking spot; her previous job was sedentary, and she had no claudication until she had to walk up an incline; 3/4 way up the incline her right calf hurts, relieved by rest. She indicated that she rarely has issues with her left leg.   There are no signs of ischemia in her feet or legs.   Her  atherosclerotic risk factors include 2-3 ppd smoking until recently down to 1/2 ppd (started in her teens), currently controlled DM, morbid obesity , and obstructive sleep apnea (uses CPAP).  She takes an 81 mg ASA and a statin daily.    DATA  Left LE Arterial Duplex (02/11/18): 50-74% stenosis in the left proximal SFA (344 cm/s). No significant change compared to the exam on 11-11-17.  ABI (Date: 02/11/2018):  R:   ABI: 1.04 (was 1.07 on 11-11-17),   PT: bi (was tri)  DP: bi (was tri)  TBI:  0.78 (was 0.81)  L:   ABI: 1.04 (was 1.04),   PT: bi (was tri)  DP: bi (was tri)  TBI: 0.73 (was 0.73)  Bilateral ABI and TBI remain normal. Waveforms have declined from tri to biphasic.    PLAN:  Based on the patient's vascular studies and examination, and after discussing with Dr. Donzetta Matters pt HPI and duplex of LE results from today, pt will return to clinic in 6 months with bilateral LE arterial duplex and ABI's.  I advised her to notify us if she develops concerns re the circulation in her feet or legs.    The patient was counseled re smoking cessation and given several free resources re smoking cessation.   I discussed in depth with the patient the nature of atherosclerosis, and emphasized the importance of maximal medical management including strict control of blood pressure, blood glucose, and lipid levels, obtaining regular exercise, and cessation of smoking.  The patient is aware that without maximal medical management the underlying atherosclerotic disease process will progress, limiting the benefit of any interventions.  The patient was given information about PAD including signs, symptoms, treatment, what symptoms should prompt the patient to seek immediate medical care, and risk reduction measures to take.  Clemon Chambers, RN, MSN, FNP-C [facility] Phone: [phone]  Clinic MD: Donzetta Matters  02/11/18 4:13 PM

## 2018-02-14 ENCOUNTER — Other Ambulatory Visit (INDEPENDENT_AMBULATORY_CARE_PROVIDER_SITE_OTHER): Payer: Self-pay

## 2018-02-14 ENCOUNTER — Other Ambulatory Visit: Payer: Self-pay | Admitting: Family

## 2018-02-14 ENCOUNTER — Telehealth: Payer: Self-pay | Admitting: Family

## 2018-02-14 ENCOUNTER — Inpatient Hospital Stay: Admission: RE | Admit: 2018-02-14 | Payer: Managed Care, Other (non HMO) | Source: Ambulatory Visit

## 2018-02-14 ENCOUNTER — Other Ambulatory Visit: Payer: Self-pay

## 2018-02-14 DIAGNOSIS — I739 Peripheral vascular disease, unspecified: Secondary | ICD-10-CM

## 2018-02-14 DIAGNOSIS — E1122 Type 2 diabetes mellitus with diabetic chronic kidney disease: Secondary | ICD-10-CM

## 2018-02-14 MED ORDER — GLUCOSE BLOOD VI STRP: ORAL_STRIP | 0 refills | Status: DC

## 2018-02-14 MED ORDER — CLOPIDOGREL BISULFATE 75 MG PO TABS: 75.0000 mg | ORAL_TABLET | Freq: Every day | ORAL | 6 refills | Status: DC

## 2018-02-14 NOTE — Progress Notes (Signed)
After communicating with Dr. Oneida Alar re pt GI irritation from taking 325 mg daily ASA, but is tolerating 81 mg daily ASA, Dr. Oneida Alar suggested resuming daily Plavix if pt can tolerate it. I left a message on pt voice mail to resume Plavix, 75 mg daily, and that I sent a prescription to her pharmacy on file.

## 2018-02-14 NOTE — Telephone Encounter (Signed)
-----   Message from Viann Fish, NP sent at 02/11/2018  5:14 PM EDT ----- Regarding: bilateral instead of left LE arterial duplex Dear schedulers, When pt returns in 6 months I had ordered ABI's and left LE arterial duplex. Keep the ABI's and make the lower extremities arterial duplex bilateral instead of just left.  Thank you, Vinnie Level

## 2018-02-14 NOTE — Telephone Encounter (Signed)
Changed notes and sent order to [facility]

## 2018-02-16 ENCOUNTER — Other Ambulatory Visit: Payer: Self-pay

## 2018-02-24 ENCOUNTER — Ambulatory Visit (INDEPENDENT_AMBULATORY_CARE_PROVIDER_SITE_OTHER): Payer: Managed Care, Other (non HMO) | Admitting: Physician Assistant

## 2018-02-24 VITALS — BP 135/80 | HR 74 | Temp 97.5°F | Ht 65.0 in | Wt 264.0 lb

## 2018-02-24 DIAGNOSIS — I10 Essential (primary) hypertension: Secondary | ICD-10-CM | POA: Diagnosis not present

## 2018-02-24 DIAGNOSIS — E66813 Obesity, class 3: Secondary | ICD-10-CM

## 2018-02-24 DIAGNOSIS — Z6841 Body Mass Index (BMI) 40.0 and over, adult: Secondary | ICD-10-CM

## 2018-02-24 NOTE — Progress Notes (Signed)
Office: [phone]  /  Fax: [phone]   HPI:   Chief Complaint: OBESITY Dayane is here to discuss her progress with her obesity treatment plan. She is on the Category 3 plan and is following her eating plan approximately 80 % of the time. She states she is walking for 45 minutes 5 times per week. 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  Her weight is 264 lb (119.7 kg) today and has gained 1 pound since her last visit. She has lost 7 lbs since starting treatment with [name].  Hypertension ROSEY EIDE is a 57 y.o. female with hypertension. Sheniah's blood pressure is stable and she denies chest pain or shortness of breath. She is working weight loss to help control her blood pressure with the goal of decreasing her risk of heart attack and stroke. Rache's blood pressure is currently controlled.  ALLERGIES: Allergies  Allergen Reactions  . Almond Oil Anaphylaxis and Nausea And Vomiting    Almond-Nuts Only  . Apple Anaphylaxis  . Ceftin [Cefuroxime] Other (See Comments)    Severe stomach pain - Diverticulitis flares up  . Cherry Extract Anaphylaxis    Cherry fruit only   . Other Anaphylaxis and Itching    Most fruits- Are NOT tolerated (PLEASE ASK PATIENT BEFORE GIVING, AS CERTAIN FRUITS ARE TOLERATED IN LIMITED QUANTITIES!!)  . Guaifenesin & Derivatives Other (See Comments)    Restlessness, jittery  . Hctz [Hydrochlorothiazide] Other (See Comments)    Causes a headache  . Morphine And Related Other (See Comments)    Moodiness.  Causes anger.  . Naproxen Other (See Comments)    Insomnia  . Lisinopril Cough         MEDICATIONS: Current Outpatient Medications on File Prior to Visit  Medication Sig Dispense Refill  . albuterol (PROVENTIL HFA;VENTOLIN HFA) 108 (90 Base) MCG/ACT inhaler Inhale 2 puffs into the lungs every 6 (six) hours as needed for wheezing or shortness of breath. 1 Inhaler 3  . aspirin EC 81 MG  tablet Take 81 mg by mouth daily.    Marland Kitchen atorvastatin (LIPITOR) 40 MG tablet Take 1 tablet (40 mg total) by mouth daily. 90 tablet 3  . blood glucose meter kit and supplies Use to check blood sugar twice a day. 1 each 0  . Blood Glucose Monitoring Suppl (ACCU-CHEK AVIVA) device Use as instructed 1 each 0  . buPROPion (WELLBUTRIN SR) 150 MG 12 hr tablet Take 150 mg by mouth in the morning  0  . carvedilol (COREG) 25 MG tablet TAKE 1 TABLET (25 MG TOTAL) BY MOUTH 2 (TWO) TIMES DAILY. 180 tablet 3  . clopidogrel (PLAVIX) 75 MG tablet Take 1 tablet (75 mg total) by mouth daily. 30 tablet 6  . dicyclomine (BENTYL) 10 MG capsule Take 1 capsule (10 mg total) by mouth daily.    Marland Kitchen doxazosin (CARDURA) 2 MG tablet TAKE 1 TABLET (2 MG TOTAL) BY MOUTH 2 (TWO) TIMES DAILY. 180 tablet 2  . glucose blood (ACCU-CHEK AVIVA) test strip Test BS twice daily 100 each 0  . glucose blood test strip Use to check blood sugar twice a day 200 each 3  . Insulin Pen Needle 32G X 4 MM MISC 1 Package by Does not apply route daily at 12 noon. 100 each 0  . Lancets (ACCU-CHEK SAFE-T PRO) lancets Use as instructed 100 each 0  . linaclotide (LINZESS) 145 MCG CAPS capsule Take 145 mcg by mouth daily  before breakfast.    . losartan (COZAAR) 100 MG tablet Take 1 tablet (100 mg total) by mouth daily. 90 tablet 1  . metFORMIN (GLUCOPHAGE) 500 MG tablet Take 1 tablet (500 mg total) by mouth 2 (two) times daily with a meal. 60 tablet 0  . Multiple Vitamin (MULTIVITAMIN WITH MINERALS) TABS tablet Take 1 tablet by mouth daily.    Marland Kitchen omeprazole (PRILOSEC) 40 MG capsule Take 1 capsule (40 mg total) by mouth daily.    . ONE TOUCH LANCETS MISC Use to check blood sugars twice a day 200 each 0  . spironolactone (ALDACTONE) 50 MG tablet TAKE 1 TABLET (50 MG TOTAL) BY MOUTH DAILY. 90 tablet 3   No current facility-administered medications on file prior to visit.     PAST MEDICAL HISTORY: Past Medical History:  Diagnosis Date  . Allergic  rhinitis   . Asthma   . Breast nodule    right breast, to see dr Helane Rima 06-20-2012 for   . Chest pain, non-cardiac   . Chronic headaches   . Cigarette smoker   . Complication of anesthesia 05-17-2012   trouble breathing after colonscopy, needed nebulizer  . Diverticulosis   . GERD (gastroesophageal reflux disease)   . Hepatic hemangioma   . Hypertension   . IBS (irritable bowel syndrome)   . Obesity   . Prosthetic eye globe   . Pyloric erosion   . Sleep apnea    CPAP setting varies from 4-10  . Type 2 diabetes mellitus ([place]) 03/11/2017    PAST SURGICAL HISTORY: Past Surgical History:  Procedure Laterality Date  . ABDOMINAL AORTOGRAM W/LOWER EXTREMITY N/A 04/30/2017   Procedure: Abdominal Aortogram w/Lower Extremity;  Surgeon: Elam Dutch, MD;  Location: [facility];  Service: Cardiovascular;  Laterality: N/A;  . CARDIAC CATHETERIZATION  12/28/2007   EF 75%. IT REVEALS NORMAL/SUPRANORMAL LEFT VENTRICULAR SYSTOLIC FUNCTION  . CARDIOVASCULAR STRESS TEST  03/25/2007   EF 78%  . CESAREAN SECTION  2003, 2009   x 2  . CHOLECYSTECTOMY  1993  . COLONOSCOPY  2014  . ENDOMETRIAL ABLATION  09/2010   and D&C  . HERNIA REPAIR  4818   umbilical  . LAPAROSCOPY    . PERIPHERAL VASCULAR BALLOON ANGIOPLASTY Left 04/30/2017   Procedure: Peripheral Vascular Balloon Angioplasty;  Surgeon: Elam Dutch, MD;  Location: [facility];  Service: Cardiovascular;  Laterality: Left;  SFA  . TUBAL LIGATION    . UMBILICAL HERNIA REPAIR  2004  . US ECHOCARDIOGRAPHY  05/08/2008   EF 55-60%  . VENTRAL HERNIA REPAIR  06/15/2012   Procedure: LAPAROSCOPIC VENTRAL HERNIA;  Surgeon: Edward Jolly, MD;  Location: [facility];  Service: General;  Laterality: N/A;  LAPAROSCOPIC REPAIR VENTRAL HERNIA  . VIDEO BRONCHOSCOPY Bilateral 11/15/2017   Procedure: VIDEO BRONCHOSCOPY WITH FLUORO;  Surgeon: Collene Gobble, MD;  Location: [facility];  Service: Cardiopulmonary;  Laterality: Bilateral;     SOCIAL HISTORY: Social History   Tobacco Use  . Smoking status: Current Every Day Smoker    Years: 25.00    Types: Cigarettes  . Smokeless tobacco: Never Used  . Tobacco comment: 10 cigarettes per day  Substance Use Topics  . Alcohol use: No    Alcohol/week: 0.0 oz  . Drug use: No    FAMILY HISTORY: Family History  Problem Relation Age of Onset  . Hypertension Mother   . Lung cancer Mother        85  . Diabetes Maternal  Grandmother   . Heart disease Father        pacemaker or defibrillator  . Heart attack Paternal Uncle   . Breast cancer Unknown   . Colon cancer Neg Hx     ROS: Review of Systems  Constitutional: Negative for weight loss.  Respiratory: Negative for shortness of breath.   Cardiovascular: Negative for chest pain.    PHYSICAL EXAM: Blood pressure 135/80, pulse 74, temperature (!) 97.5 F (36.4 C), temperature source Oral, height '5\' 5"'$  (1.651 m), weight 264 lb (119.7 kg), SpO2 98 %. Body mass index is 43.93 kg/m. Physical Exam  Constitutional: She is oriented to person, place, and time. She appears well-developed and well-nourished.  Cardiovascular: Normal rate.  Pulmonary/Chest: Effort normal.  Musculoskeletal: Normal range of motion.  Neurological: She is oriented to person, place, and time.  Skin: Skin is warm and dry.  Psychiatric: She has a normal mood and affect. Her behavior is normal.  Vitals reviewed.   RECENT LABS AND TESTS: BMET    Component Value Date/Time   NA 137 10/15/2017 0112   NA 138 04/22/2017 1145   K 3.2 (L) 10/15/2017 0112   CL 106 10/15/2017 0112   CO2 23 10/15/2017 0112   GLUCOSE 107 (H) 10/15/2017 0112   BUN 21 (H) 10/15/2017 0112   BUN 19 04/22/2017 1145   CREATININE 1.26 (H) 10/15/2017 0112   CREATININE 1.08 (H) 11/12/2016 1040   CALCIUM 9.7 10/15/2017 0112   GFRNONAA 47 (L) 10/15/2017 0112   GFRAA 55 (L) 10/15/2017 0112   Lab Results  Component Value Date   HGBA1C 6.3 07/30/2017   HGBA1C 7.0 (H)  04/22/2017   HGBA1C 7.7 (H) 03/11/2017   HGBA1C 6.2 02/13/2014   Lab Results  Component Value Date   INSULIN 19.6 04/22/2017   CBC    Component Value Date/Time   WBC 11.5 (H) 10/15/2017 0112   RBC 4.70 10/15/2017 0112   HGB 14.4 10/15/2017 0112   HGB 14.3 04/22/2017 1145   HCT 41.9 10/15/2017 0112   HCT 42.3 04/22/2017 1145   PLT 232 10/15/2017 0112   MCV 89.1 10/15/2017 0112   MCV 90 04/22/2017 1145   MCH 30.6 10/15/2017 0112   MCHC 34.4 10/15/2017 0112   RDW 13.7 10/15/2017 0112   RDW 13.9 04/22/2017 1145   LYMPHSABS 2.4 06/23/2017 1221   LYMPHSABS 2.5 04/22/2017 1145   MONOABS 0.5 06/23/2017 1221   EOSABS 0.1 06/23/2017 1221   EOSABS 0.1 04/22/2017 1145   BASOSABS 0.0 06/23/2017 1221   BASOSABS 0.0 04/22/2017 1145   Iron/TIBC/Ferritin/ %Sat No results found for: IRON, TIBC, FERRITIN, IRONPCTSAT Lipid Panel     Component Value Date/Time   CHOL 130 04/22/2017 1145   TRIG 141 04/22/2017 1145   HDL 25 (L) 04/22/2017 1145   CHOLHDL 6 05/26/2016 1134   VLDL 26.0 05/26/2016 1134   LDLCALC 77 04/22/2017 1145   Hepatic Function Panel     Component Value Date/Time   PROT 7.3 06/23/2017 1221   PROT 7.5 04/22/2017 1145   ALBUMIN 3.6 06/23/2017 1221   ALBUMIN 4.3 04/22/2017 1145   AST 17 06/23/2017 1221   ALT 18 06/23/2017 1221   ALKPHOS 85 06/23/2017 1221   BILITOT 0.6 06/23/2017 1221   BILITOT 0.4 04/22/2017 1145   BILIDIR 0.1 02/28/2016 1414      Component Value Date/Time   TSH 1.040 04/22/2017 1145   TSH 1.70 02/13/2014 1145   TSH 1.795 Test methodology is 3rd generation TSH 12/27/2007 1447  ASSESSMENT AND PLAN: Essential hypertension  Class 3 severe obesity with serious comorbidity and body mass index (BMI) of 40.0 to 44.9 in adult, unspecified obesity type ([place])  PLAN:  Hypertension We discussed sodium restriction, working on healthy weight loss, and a regular exercise program as the means to achieve improved blood pressure control. Dorothe agreed  with this plan and agreed to follow up as directed. We will continue to monitor her blood pressure as well as her progress with the above lifestyle modifications. She will continue her medications as prescribed and will watch for signs of hypotension as she continues her lifestyle modifications. Audrianna agrees to follow up with our clinic in 2 weeks.  We spent > than 50% of the 15 minute visit on the counseling as documented in the note.  Obesity Aamya is currently in the action stage of change. As such, her goal is to continue with weight loss efforts She has agreed to portion control better and make smarter food choices, such as increase vegetables and decrease simple carbohydrates  Chantal has been instructed to work up to a goal of 150 minutes of combined cardio and strengthening exercise per week for weight loss and overall health benefits. We discussed the following Behavioral Modification Strategies today: work on meal planning and easy cooking plans and decrease liquid calories   Cincere has agreed to follow up with our clinic in 2 weeks. She was informed of the importance of frequent follow up visits to maximize her success with intensive lifestyle modifications for her multiple health conditions.   OBESITY BEHAVIORAL INTERVENTION VISIT  Today's visit was # 14 out of 22.  Starting weight: 271 lbs Starting date: 04/22/17 Today's weight : 264 lbs Today's date: 02/24/2018 Total lbs lost to date: 7 (Patients must lose 7 lbs in the first 6 months to continue with counseling)   ASK: We discussed the diagnosis of obesity with [name] today and Sparkles agreed to give [name] permission to discuss obesity behavioral modification therapy today.  ASSESS: Kristianna has the diagnosis of obesity and her BMI today is 43.93 Zorina is in the action stage of change   ADVISE: Neziah was educated on the multiple health risks of obesity as well as the benefit of weight loss to improve her health. She was  advised of the need for long term treatment and the importance of lifestyle modifications.  AGREE: Multiple dietary modification options and treatment options were discussed and  Io agreed to the above obesity treatment plan.   Wilhemena Durie, am acting as transcriptionist for Lacy Duverney, PA-C I, Lacy Duverney [facility], have reviewed this note and agree with its content

## 2018-02-28 ENCOUNTER — Other Ambulatory Visit: Payer: Managed Care, Other (non HMO)

## 2018-03-15 ENCOUNTER — Ambulatory Visit (INDEPENDENT_AMBULATORY_CARE_PROVIDER_SITE_OTHER): Payer: Managed Care, Other (non HMO) | Admitting: Physician Assistant

## 2018-03-15 VITALS — BP 156/80 | HR 73 | Temp 97.8°F | Ht 65.0 in | Wt 264.0 lb

## 2018-03-15 DIAGNOSIS — Z9189 Other specified personal risk factors, not elsewhere classified: Secondary | ICD-10-CM

## 2018-03-15 DIAGNOSIS — Z6841 Body Mass Index (BMI) 40.0 and over, adult: Secondary | ICD-10-CM | POA: Diagnosis not present

## 2018-03-15 DIAGNOSIS — R251 Tremor, unspecified: Secondary | ICD-10-CM

## 2018-03-15 DIAGNOSIS — E1165 Type 2 diabetes mellitus with hyperglycemia: Secondary | ICD-10-CM | POA: Diagnosis not present

## 2018-03-15 MED ORDER — METFORMIN HCL 1000 MG PO TABS: 1000.0000 mg | ORAL_TABLET | Freq: Two times a day (BID) | ORAL | 0 refills | Status: DC

## 2018-03-16 NOTE — Progress Notes (Addendum)
Office: [phone]  /  Fax: [phone]   HPI:   Chief Complaint: OBESITY Lisa [name] is here to discuss her progress with her obesity treatment plan. She is on the portion control better and make smarter food choices plan and is following her eating plan approximately 75 to 80 % of the time. She states she is walking for 30 minutes 5 times per week. Lisa [name] maintained her weight. She states she is making smarter food choices, however she continues to have challenges getting all the protein on her meal plan. Her weight is 264 lb (119.7 kg) today and has maintained weight over a period of 2 to 3 weeks since her last visit. She has lost 7 lbs since starting treatment with [name].  Diabetes II non insulin with hyperglycemia Lisa [name] has a diagnosis of diabetes type II. Lisa [name] is not checking her blood sugar at home and denies any hypoglycemic episodes. Lisa [name] is not taking Victoza because she can't afford it. She is on metformin 500 mg twice daily, but has only been taking it once daily. She has been working on intensive lifestyle modifications including diet, exercise, and weight loss to help control her blood [name] levels.  At risk for cardiovascular disease Lisa [name] is at a higher than average risk for cardiovascular disease due to obesity and diabetes. She currently denies any chest pain.  Lisa [name] Lisa [name] admits to feeling shaky and has a headache in the office today. She denies  diaphoresis, any pain,  visual disturbance, weakness, anxiousness, dyspnea or chest pain. She states her symptoms are typical to when she has a panic attack. Last panic attack was at work 2 weeks ago.Lisa [name] has relaxed during the office visit, and hand Lisa [name] has subsided. She verbalized that she is feeling fine and "attack" has subsided. CBG checked in the office is 142. ALLERGIES: Allergies  Allergen Reactions  . Almond Oil Anaphylaxis and Nausea And Vomiting    Almond-Nuts Only  . Apple Anaphylaxis  . Ceftin [Cefuroxime]  Other (See Comments)    Severe stomach pain - Diverticulitis flares up  . Cherry Extract Anaphylaxis    Cherry fruit only   . Other Anaphylaxis and Itching    Most fruits- Are NOT tolerated (PLEASE ASK PATIENT BEFORE GIVING, AS CERTAIN FRUITS ARE TOLERATED IN LIMITED QUANTITIES!!)  . Guaifenesin & Derivatives Other (See Comments)    Restlessness, jittery  . Hctz [Hydrochlorothiazide] Other (See Comments)    Causes a headache  . Morphine And Related Other (See Comments)    Moodiness.  Causes anger.  . Naproxen Other (See Comments)    Insomnia  . Lisinopril Cough         MEDICATIONS: Current Outpatient Medications on File Prior to Visit  Medication Sig Dispense Refill  . albuterol (PROVENTIL HFA;VENTOLIN HFA) 108 (90 Base) MCG/ACT inhaler Inhale 2 puffs into the lungs every 6 (six) hours as needed for wheezing or shortness of breath. 1 Inhaler 3  . aspirin EC 81 MG tablet Take 81 mg by mouth daily.    Marland Kitchen atorvastatin (LIPITOR) 40 MG tablet Take 1 tablet (40 mg total) by mouth daily. 90 tablet 3  . blood [name] meter kit and supplies Use to check blood sugar twice a day. 1 each 0  . Blood [name] Monitoring Suppl (ACCU-CHEK AVIVA) device Use as instructed 1 each 0  . buPROPion (WELLBUTRIN SR) 150 MG 12 hr tablet Take 150 mg by mouth in the morning  0  . carvedilol (COREG) 25 MG tablet TAKE 1 TABLET (25  MG TOTAL) BY MOUTH 2 (TWO) TIMES DAILY. 180 tablet 3  . clopidogrel (PLAVIX) 75 MG tablet Take 1 tablet (75 mg total) by mouth daily. 30 tablet 6  . dicyclomine (BENTYL) 10 MG capsule Take 1 capsule (10 mg total) by mouth daily.    Marland Kitchen doxazosin (CARDURA) 2 MG tablet TAKE 1 TABLET (2 MG TOTAL) BY MOUTH 2 (TWO) TIMES DAILY. 180 tablet 2  . [name] blood (ACCU-CHEK AVIVA) test strip Test BS twice daily 100 each 0  . [name] blood test strip Use to check blood sugar twice a day 200 each 3  . Insulin Pen Needle 32G X 4 MM MISC 1 Package by Does not apply route daily at 12 noon. 100 each 0  .  Lancets (ACCU-CHEK SAFE-T PRO) lancets Use as instructed 100 each 0  . linaclotide (LINZESS) 145 MCG CAPS capsule Take 145 mcg by mouth daily before breakfast.    . losartan (COZAAR) 100 MG tablet Take 1 tablet (100 mg total) by mouth daily. 90 tablet 1  . Multiple Vitamin (MULTIVITAMIN WITH MINERALS) TABS tablet Take 1 tablet by mouth daily.    Marland Kitchen omeprazole (PRILOSEC) 40 MG capsule Take 1 capsule (40 mg total) by mouth daily.    . ONE TOUCH LANCETS MISC Use to check blood sugars twice a day 200 each 0  . spironolactone (ALDACTONE) 50 MG tablet TAKE 1 TABLET (50 MG TOTAL) BY MOUTH DAILY. 90 tablet 3   No current facility-administered medications on file prior to visit.     PAST MEDICAL HISTORY: Past Medical History:  Diagnosis Date  . Allergic rhinitis   . Asthma   . Breast nodule    right breast, to see dr Helane Rima 06-20-2012 for   . Chest pain, non-cardiac   . Chronic headaches   . Cigarette smoker   . Complication of anesthesia 05-17-2012   trouble breathing after colonscopy, needed nebulizer  . Diverticulosis   . GERD (gastroesophageal reflux disease)   . Hepatic hemangioma   . Hypertension   . IBS (irritable bowel syndrome)   . Obesity   . Prosthetic eye globe   . Pyloric erosion   . Sleep apnea    CPAP setting varies from 4-10  . Type 2 diabetes mellitus ([place]) 03/11/2017    PAST SURGICAL HISTORY: Past Surgical History:  Procedure Laterality Date  . ABDOMINAL AORTOGRAM W/LOWER EXTREMITY N/A 04/30/2017   Procedure: Abdominal Aortogram w/Lower Extremity;  Surgeon: Elam Dutch, MD;  Location: [facility];  Service: Cardiovascular;  Laterality: N/A;  . CARDIAC CATHETERIZATION  12/28/2007   EF 75%. IT REVEALS NORMAL/SUPRANORMAL LEFT VENTRICULAR SYSTOLIC FUNCTION  . CARDIOVASCULAR STRESS TEST  03/25/2007   EF 78%  . CESAREAN SECTION  2003, 2009   x 2  . CHOLECYSTECTOMY  1993  . COLONOSCOPY  2014  . ENDOMETRIAL ABLATION  09/2010   and D&C  . HERNIA REPAIR  6546    umbilical  . LAPAROSCOPY    . PERIPHERAL VASCULAR BALLOON ANGIOPLASTY Left 04/30/2017   Procedure: Peripheral Vascular Balloon Angioplasty;  Surgeon: Elam Dutch, MD;  Location: [facility];  Service: Cardiovascular;  Laterality: Left;  SFA  . TUBAL LIGATION    . UMBILICAL HERNIA REPAIR  2004  . US ECHOCARDIOGRAPHY  05/08/2008   EF 55-60%  . VENTRAL HERNIA REPAIR  06/15/2012   Procedure: LAPAROSCOPIC VENTRAL HERNIA;  Surgeon: Edward Jolly, MD;  Location: [facility];  Service: General;  Laterality: N/A;  LAPAROSCOPIC REPAIR VENTRAL  HERNIA  . VIDEO BRONCHOSCOPY Bilateral 11/15/2017   Procedure: VIDEO BRONCHOSCOPY WITH FLUORO;  Surgeon: Collene Gobble, MD;  Location: [facility];  Service: Cardiopulmonary;  Laterality: Bilateral;    SOCIAL HISTORY: Social History   Tobacco Use  . Smoking status: Current Every Day Smoker    Years: 25.00    Types: Cigarettes  . Smokeless tobacco: Never Used  . Tobacco comment: 10 cigarettes per day  Substance Use Topics  . Alcohol use: No    Alcohol/week: 0.0 oz  . Drug use: No    FAMILY HISTORY: Family History  Problem Relation Age of Onset  . Hypertension Mother   . Lung cancer Mother        67  . Diabetes Maternal Grandmother   . Heart disease Father        pacemaker or defibrillator  . Heart attack Paternal Uncle   . Breast cancer Unknown   . Colon cancer Neg Hx     ROS: Review of Systems  Constitutional: Negative for weight loss.       Negative for pain Negative for diaphoresis  Eyes:       Negative for visual disturbance  Respiratory: Negative for shortness of breath.   Cardiovascular: Negative for chest pain.  Neurological: Positive for headaches. Negative for weakness.       Positive for mild Lisa [name] in bilateral hands  Endo/Heme/Allergies:       Negative for hypoglycemia Positive for hyperglycemia  Psychiatric/Behavioral: The patient is not nervous/anxious (anxiousness).     PHYSICAL EXAM: Blood pressure  (!) 156/80, pulse 73, temperature 97.8 F (36.6 C), temperature source Oral, height _0  (1.651 m), weight 264 lb (119.7 kg), SpO2 96 %. Body mass index is 43.93 kg/m. Physical Exam  Constitutional: She is oriented to person, place, and time. She appears well-developed and well-nourished.  Cardiovascular: Normal rate.  Pulmonary/Chest: Effort normal.  Musculoskeletal: Normal range of motion.  Neurological: She is oriented to person, place, and time.  Skin: Skin is warm and dry.  Psychiatric: She has a normal mood and affect. Her behavior is normal.  Vitals reviewed.   RECENT LABS AND TESTS: BMET    Component Value Date/Time   NA 137 10/15/2017 0112   NA 138 04/22/2017 1145   K 3.2 (L) 10/15/2017 0112   CL 106 10/15/2017 0112   CO2 23 10/15/2017 0112   [name] 107 (H) 10/15/2017 0112   BUN 21 (H) 10/15/2017 0112   BUN 19 04/22/2017 1145   CREATININE 1.26 (H) 10/15/2017 0112   CREATININE 1.08 (H) 11/12/2016 1040   CALCIUM 9.7 10/15/2017 0112   GFRNONAA 47 (L) 10/15/2017 0112   GFRAA 55 (L) 10/15/2017 0112   Lab Results  Component Value Date   HGBA1C 6.3 07/30/2017   HGBA1C 7.0 (H) 04/22/2017   HGBA1C 7.7 (H) 03/11/2017   HGBA1C 6.2 02/13/2014   Lab Results  Component Value Date   INSULIN 19.6 04/22/2017   CBC    Component Value Date/Time   WBC 11.5 (H) 10/15/2017 0112   RBC 4.70 10/15/2017 0112   HGB 14.4 10/15/2017 0112   HGB 14.3 04/22/2017 1145   HCT 41.9 10/15/2017 0112   HCT 42.3 04/22/2017 1145   PLT 232 10/15/2017 0112   MCV 89.1 10/15/2017 0112   MCV 90 04/22/2017 1145   MCH 30.6 10/15/2017 0112   MCHC 34.4 10/15/2017 0112   RDW 13.7 10/15/2017 0112   RDW 13.9 04/22/2017 1145   LYMPHSABS 2.4 06/23/2017 1221  LYMPHSABS 2.5 04/22/2017 1145   MONOABS 0.5 06/23/2017 1221   EOSABS 0.1 06/23/2017 1221   EOSABS 0.1 04/22/2017 1145   BASOSABS 0.0 06/23/2017 1221   BASOSABS 0.0 04/22/2017 1145   Iron/TIBC/Ferritin/ %Sat No results found for: IRON,  TIBC, FERRITIN, IRONPCTSAT Lipid Panel     Component Value Date/Time   CHOL 130 04/22/2017 1145   TRIG 141 04/22/2017 1145   HDL 25 (L) 04/22/2017 1145   CHOLHDL 6 05/26/2016 1134   VLDL 26.0 05/26/2016 1134   LDLCALC 77 04/22/2017 1145   Hepatic Function Panel     Component Value Date/Time   PROT 7.3 06/23/2017 1221   PROT 7.5 04/22/2017 1145   ALBUMIN 3.6 06/23/2017 1221   ALBUMIN 4.3 04/22/2017 1145   AST 17 06/23/2017 1221   ALT 18 06/23/2017 1221   ALKPHOS 85 06/23/2017 1221   BILITOT 0.6 06/23/2017 1221   BILITOT 0.4 04/22/2017 1145   BILIDIR 0.1 02/28/2016 1414      Component Value Date/Time   TSH 1.040 04/22/2017 1145   TSH 1.70 02/13/2014 1145   TSH 1.795 Test methodology is 3rd generation TSH 12/27/2007 1447   Results for TAKEIRA, YANES (MRN [id number]) as of 03/16/2018 08:33  Ref. Range 04/22/2017 11:45  Vitamin D, 25-Hydroxy Latest Ref Range: 30.0 - 100.0 ng/mL 40.7   ASSESSMENT AND PLAN: Type 2 diabetes mellitus with hyperglycemia, without long-term current use of insulin (HCC) - Plan: metFORMIN (GLUCOPHAGE) 1000 MG tablet  Lisa [name]  At risk for heart disease  Class 3 severe obesity with serious comorbidity and body mass index (BMI) of 40.0 to 44.9 in adult, unspecified obesity type ([place])  PLAN:  Diabetes II non insulin with hyperglycemia Lisa [name] has been given extensive diabetes education by myself today including ideal fasting and post-prandial blood [name] readings, individual ideal Hgb A1c goals and hypoglycemia prevention. We discussed the importance of good blood sugar control to decrease the likelihood of diabetic complications such as nephropathy, neuropathy, limb loss, blindness, coronary artery disease, and death. We discussed the importance of intensive lifestyle modification including diet, exercise and weight loss as the first line treatment for diabetes. Lisa [name] agrees to increase metformin to 1,000 mg bid #60 with no refills and follow up at the  agreed upon time.  Cardiovascular risk counseling Lisa [name] was given extended (15 minutes) coronary artery disease prevention counseling today. She is 57 y.o. female and has risk factors for heart disease including obesity and diabetes. We discussed intensive lifestyle modifications today with an emphasis on specific weight loss instructions and strategies. Pt was also informed of the importance of increasing exercise and decreasing saturated fats to help prevent heart disease.  Lisa [name] Lisa [name] was 142 and vitals were normal. Lisa [name] subsided by the end her visit. Patient encouraged to stay under our watch in the office and to avoid driving back home, however, she declined to stay and stated she is leave and drive back home. Lisa [name] agrees to follow up with our clinic in 2 weeks. She is advised to follow up with her PCP for further management of her panic attacks and any needed further investigation of her symptoms. If symptoms re-ocurr, she is to advised to go to the ER.  Obesity Lisa [name] is currently in the action stage of change. As such, her goal is to continue with weight loss efforts She has agreed to portion control better and make smarter food choices, such as increase vegetables and decrease simple carbohydrates  Lisa [name] has been instructed to work  up to a goal of 150 minutes of combined cardio and strengthening exercise per week for weight loss and overall health benefits. We discussed the following Behavioral Modification Strategies today: decreasing simple carbohydrates  and decrease junk food  Lisa [name] has agreed to follow up with our clinic in 2 weeks. She was informed of the importance of frequent follow up visits to maximize her success with intensive lifestyle modifications for her multiple health conditions.   OBESITY BEHAVIORAL INTERVENTION VISIT  Today's visit was # 15 out of 22.  Starting weight: 271 lbs Starting date: 04/22/17 Today's weight : 271 lbs  Today's  date: 03/15/2018 Total lbs lost to date: 7 (Patients must lose 7 lbs in the first 6 months to continue with counseling)   ASK: We discussed the diagnosis of obesity with Lisa [name] today and Lisa [name] agreed to give [name] permission to discuss obesity behavioral modification therapy today.  ASSESS: Lisa [name] has the diagnosis of obesity and her BMI today is 43.93 Lisa [name] is in the action stage of change   ADVISE: Lisa [name] was educated on the multiple health risks of obesity as well as the benefit of weight loss to improve her health. She was advised of the need for long term treatment and the importance of lifestyle modifications.  AGREE: Multiple dietary modification options and treatment options were discussed and  Lisa [name] agreed to the above obesity treatment plan.   Lisa [name], am acting as transcriptionist for [employer], PA-C I, Lacy Duverney [facility], have reviewed this note and agree with its content

## 2018-03-31 ENCOUNTER — Other Ambulatory Visit: Payer: Self-pay | Admitting: Emergency Medicine

## 2018-04-04 ENCOUNTER — Encounter (INDEPENDENT_AMBULATORY_CARE_PROVIDER_SITE_OTHER): Payer: Self-pay

## 2018-04-04 ENCOUNTER — Ambulatory Visit (INDEPENDENT_AMBULATORY_CARE_PROVIDER_SITE_OTHER): Payer: Managed Care, Other (non HMO) | Admitting: Physician Assistant

## 2018-04-12 ENCOUNTER — Encounter: Payer: Self-pay | Admitting: Family Medicine

## 2018-04-12 ENCOUNTER — Ambulatory Visit (INDEPENDENT_AMBULATORY_CARE_PROVIDER_SITE_OTHER): Payer: Managed Care, Other (non HMO) | Admitting: Family Medicine

## 2018-04-12 VITALS — BP 136/82 | HR 69 | Temp 98.2°F | Ht 65.0 in | Wt 273.2 lb

## 2018-04-12 DIAGNOSIS — Z124 Encounter for screening for malignant neoplasm of cervix: Secondary | ICD-10-CM | POA: Diagnosis not present

## 2018-04-12 DIAGNOSIS — Z1231 Encounter for screening mammogram for malignant neoplasm of breast: Secondary | ICD-10-CM

## 2018-04-12 DIAGNOSIS — E785 Hyperlipidemia, unspecified: Secondary | ICD-10-CM | POA: Diagnosis not present

## 2018-04-12 DIAGNOSIS — M79604 Pain in right leg: Secondary | ICD-10-CM | POA: Diagnosis not present

## 2018-04-12 DIAGNOSIS — E1165 Type 2 diabetes mellitus with hyperglycemia: Secondary | ICD-10-CM | POA: Diagnosis not present

## 2018-04-12 DIAGNOSIS — Z1239 Encounter for other screening for malignant neoplasm of breast: Secondary | ICD-10-CM

## 2018-04-12 DIAGNOSIS — I10 Essential (primary) hypertension: Secondary | ICD-10-CM | POA: Diagnosis not present

## 2018-04-12 DIAGNOSIS — M79605 Pain in left leg: Secondary | ICD-10-CM | POA: Diagnosis not present

## 2018-04-12 LAB — POCT GLYCOSYLATED HEMOGLOBIN (HGB A1C): HEMOGLOBIN A1C: 6.5

## 2018-04-12 NOTE — Assessment & Plan Note (Signed)
S: Reasonably controlled on atorvastatin 40 mg with last LDL 77 last May  A/P: Update lipids with next fasting labs

## 2018-04-12 NOTE — Assessment & Plan Note (Signed)
S:  controlled on Metformin 1000 mg twice daily Exercise and diet- weight is up 2 lbs since last visit here. She is not exercising.  Lab Results  Component Value Date   HGBA1C 6.5 04/12/2018   HGBA1C 6.3 07/30/2017   HGBA1C 7.0 (H) 04/22/2017   A/P: a1c trending up some- discussed importance of weight loss as has trended up some lately

## 2018-04-12 NOTE — Progress Notes (Signed)
Subjective:  Lisa [name] is a 57 y.o. year old very pleasant female patient who presents for/with See problem oriented charting ROS-difficulty losing weight- difficult to make lifestyle adjustments, baseline shortness of breath, no chest pain.  No increased edema  Past Medical History-  Patient Active Problem List   Diagnosis Date Noted  . Type 2 diabetes mellitus ([place]) 03/11/2017    Priority: High  . PAD (peripheral artery disease) ([place]) 01/19/2017    Priority: High  . 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 Morbid obesity ([place]) 02/14/2016    Priority: High  . Tobacco use disorder 08/12/2015    Priority: High  . COPD (chronic obstructive pulmonary disease) ([place]) 01/18/2015    Priority: High  . Hyperlipidemia 04/12/2018    Priority: Medium  . Breast mass 08/03/2017    Priority: Medium  . GERD (gastroesophageal reflux disease) 02/14/2016    Priority: Medium  . DOE (dyspnea on exertion) 03/08/2014    Priority: Medium  . Migraine without aura 07/26/2013    Priority: Medium  . Sleep apnea 02/11/2012    Priority: Medium  . Chest discomfort 10/12/2011    Priority: Medium  . Hypertension associated with diabetes ([place]) 10/12/2011    Priority: Medium  . Chronic hip pain 07/29/2017    Priority: Low  . Left knee pain 05/15/2016    Priority: Low  . Flank pain 04/21/2016    Priority: Low  . Abdominal pain 02/28/2016    Priority: Low  . Pulmonary nodule, right 04/11/2015    Priority: Low  . Mediastinal lymphadenopathy 01/18/2015    Priority: Low  . Bilateral leg pain 04/14/2018  . Essential hypertension 12/20/2017  . Pulmonary infiltrates   . S/P bronchoscopy with biopsy   . Hyperlipidemia associated with type 2 diabetes mellitus ([place]) 09/06/2017    Medications- reviewed and updated Current Outpatient Medications  Medication Sig Dispense Refill  . albuterol (PROVENTIL HFA;VENTOLIN HFA) 108 (90 Base) MCG/ACT  inhaler Inhale 2 puffs into the lungs every 6 (six) hours as needed for wheezing or shortness of breath. 1 Inhaler 3  . aspirin EC 81 MG tablet Take 81 mg by mouth daily.    Marland Kitchen atorvastatin (LIPITOR) 40 MG tablet Take 1 tablet (40 mg total) by mouth daily. 90 tablet 3  . buPROPion (WELLBUTRIN SR) 150 MG 12 hr tablet Take 150 mg by mouth in the morning  0  . carvedilol (COREG) 25 MG tablet TAKE 1 TABLET (25 MG TOTAL) BY MOUTH 2 (TWO) TIMES DAILY. 180 tablet 3  . clopidogrel (PLAVIX) 75 MG tablet Take 1 tablet (75 mg total) by mouth daily. 30 tablet 6  . dicyclomine (BENTYL) 10 MG capsule Take 1 capsule (10 mg total) by mouth daily.    Marland Kitchen doxazosin (CARDURA) 2 MG tablet TAKE 1 TABLET (2 MG TOTAL) BY MOUTH 2 (TWO) TIMES DAILY. 180 tablet 2  . linaclotide (LINZESS) 145 MCG CAPS capsule Take 145 mcg by mouth daily before breakfast.    . losartan (COZAAR) 100 MG tablet Take 1 tablet (100 mg total) by mouth daily. 90 tablet 1  . metFORMIN (GLUCOPHAGE) 1000 MG tablet Take 1 tablet (1,000 mg total) by mouth 2 (two) times daily with a meal. 60 tablet 0  . Multiple Vitamin (MULTIVITAMIN WITH MINERALS) TABS tablet Take 1 tablet by mouth daily.    Marland Kitchen omeprazole (PRILOSEC) 40 MG capsule Take 1 capsule (40 mg total) by mouth daily.    . ONE TOUCH  LANCETS MISC Use to check blood sugars twice a day 200 each 0  . spironolactone (ALDACTONE) 50 MG tablet TAKE 1 TABLET (50 MG TOTAL) BY MOUTH DAILY. 90 tablet 3   No current facility-administered medications for this visit.     Objective: BP 136/82 (BP Location: Left Arm, Patient Position: Sitting, Cuff Size: Large)   Pulse 69   Temp 98.2 F (36.8 C) (Oral)   Ht 5\' 5"  (1.651 m)   Wt 273 lb 3.2 oz (123.9 kg)   SpO2 97%   BMI 45.46 kg/m  Gen: NAD, resting comfortably Disconjugate gaze CV: RRR no murmurs rubs or gallops Lungs: CTAB no crackles, wheeze, rhonchi Abdomen: soft/nontender/obese Ext: no edema Skin: warm, dry Neuro: Normal speech and gait Diabetic  Foot Exam - Simple   Simple Foot Form Diabetic Foot exam was performed with the following findings:  Yes 04/12/2018  5:36 PM  Visual Inspection No deformities, no ulcerations, no other skin breakdown bilaterally:  Yes Sensation Testing Intact to touch and monofilament testing bilaterally:  Yes Pulse Check Posterior Tibialis and Dorsalis pulse intact bilaterally:  Yes Comments     Assessment/Plan:  Other notes: 1.Encouraged follow-up in November for chest pain with Dr. Acie Fredrickson. She saw Estella Husk who ordered updated stress test. Does not appear this was done on 11/01/17 as planned. I asked her to call to schedule follow up with Dr. Acie Fredrickson  2.  Encourage smoking cessation 3. Monitor GFR- in 50s last check- if persists on labs- will need to add ckd iii  Hypertension S: controlled on Cardura 2 mg losartan 100 mg, spironolactone 50 mg, Coreg 25 mg twice daily BP Readings from Last 3 Encounters:  04/12/18 136/82  03/15/18 (!) 156/80  02/24/18 135/80  A/P: We discussed blood pressure goal of <140/90. Continue current meds  Type 2 diabetes mellitus (HCC) S:  controlled on Metformin 1000 mg twice daily Exercise and diet- weight is up 2 lbs since last visit here. She is not exercising.  Lab Results  Component Value Date   HGBA1C 6.5 04/12/2018   HGBA1C 6.3 07/30/2017   HGBA1C 7.0 (H) 04/22/2017   A/P: a1c trending up some- discussed importance of weight loss as has trended up some lately  Hyperlipidemia S: Reasonably controlled on atorvastatin 40 mg with last LDL 77 last May  A/P: Update lipids with next fasting labs  Bilateral leg pain S: Patient with history of angioplasty in 04/30/2017 of left mid superficial femoral artery. 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 ABI on right was normal. Left leg with normal ABI  Vascular surgeon thought back could be the issues- she has pain down into both legs wrapping around knees- down  into calves and into feet. Gets some numbness with this.  Pain has been going on for years- has seen Dr. Tonita Cong in past for biceps tendon tear. Sees Dr. Paulla Fore for her hand.  A/P: Cared for Dr. Paulla Fore of sports medicine- also will message Dr. Oneida Alar about stenosis on right leg.   Message to Dr. Oneida Alar "Dr. 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  Thanks, Garret Reddish"   Future Appointments  Date Time Provider [place]  04/27/2018  4:30 PM Collene Gobble,  MD [facility] None  08/18/2018 12:00 PM [facility] - [facility]  08/18/2018  1:00 PM [facility] - [facility]  08/18/2018  1:15 PM Nickel, Sharmon Leyden, NP [facility]   No follow-ups on file.  Lab/Order associations: Type 2 diabetes mellitus with hyperglycemia, without long-term current use of insulin ([place]) - Plan: POCT glycosylated hemoglobin (Hb A1C)  Essential hypertension - Plan: Lipid panel, CBC, Comprehensive metabolic panel  Hyperlipidemia, unspecified hyperlipidemia type - Plan: Lipid panel, CBC, Comprehensive metabolic panel  Screening for breast cancer - Plan: Ambulatory referral to Breast Clinic  Screening for cervical cancer - Plan: Ambulatory referral to Gynecology  Bilateral leg pain - Plan: Ambulatory referral to Sports Medicine  No orders of the defined types were placed in this encounter.   Return precautions advised.  Garret Reddish, MD

## 2018-04-12 NOTE — Patient Instructions (Addendum)
Health Maintenance Due  Topic Date Due  . PAP SMEAR - refer to gynecology . Also referred to breast center. You should get a call within a week 10/16/2017  . HEMOGLOBIN A1C - Today at office visit 01/27/2018  . FOOT EXAM - Today at office visit 03/24/2018   Schedule a visit with Dr. Paulla Fore to follow up on the hand- ill put in a new referral for the back. You can schedule this if anyone at the desk or call back  Schedule a lab visit at the check out desk for 04/22/18 or later. Return for future fasting labs meaning nothing but water after midnight please. Ok to take your medications with water.

## 2018-04-14 DIAGNOSIS — M79605 Pain in left leg: Secondary | ICD-10-CM

## 2018-04-14 DIAGNOSIS — M79604 Pain in right leg: Secondary | ICD-10-CM | POA: Insufficient documentation

## 2018-04-14 NOTE — Assessment & Plan Note (Signed)
S: Patient with history of angioplasty in 04/30/2017 of left mid superficial femoral artery. 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 ABI on right was normal. Left leg with normal ABI  Vascular surgeon thought back could be the issues- she has pain down into both legs wrapping around knees- down into calves and into feet. Gets some numbness with this.  Pain has been going on for years- has seen Dr. Tonita Cong in past for biceps tendon tear. Sees Dr. Paulla Fore for her hand.  A/P: Cared for Dr. Paulla Fore of sports medicine- also will message Dr. Oneida Alar about stenosis on right leg.   Message to Dr. Oneida Alar "Dr. 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  Thanks, Lisa [name]"

## 2018-04-17 ENCOUNTER — Other Ambulatory Visit: Payer: Self-pay | Admitting: Family Medicine

## 2018-04-17 DIAGNOSIS — F17200 Nicotine dependence, unspecified, uncomplicated: Secondary | ICD-10-CM

## 2018-04-17 DIAGNOSIS — I739 Peripheral vascular disease, unspecified: Secondary | ICD-10-CM

## 2018-04-21 ENCOUNTER — Encounter: Payer: Self-pay | Admitting: Sports Medicine

## 2018-04-21 ENCOUNTER — Ambulatory Visit: Payer: Self-pay

## 2018-04-21 ENCOUNTER — Ambulatory Visit (INDEPENDENT_AMBULATORY_CARE_PROVIDER_SITE_OTHER): Payer: Managed Care, Other (non HMO) | Admitting: Sports Medicine

## 2018-04-21 VITALS — BP 124/82 | HR 72 | Ht 65.0 in | Wt 270.4 lb

## 2018-04-21 DIAGNOSIS — M79644 Pain in right finger(s): Secondary | ICD-10-CM

## 2018-04-21 DIAGNOSIS — M65331 Trigger finger, right middle finger: Secondary | ICD-10-CM

## 2018-04-21 NOTE — Progress Notes (Signed)
Lisa [name]. Lisa [name], [place] [facility]  Lisa [name] - 57 y.o. female MRN [id number]  Date of birth: 02-Apr-1961  Visit Date: 04/21/2018  PCP: Marin Olp, MD   Referred by: Marin Olp, MD  Scribe for today's visit: Josepha Pigg, CMA     SUBJECTIVE:  Lisa [name] is here for Follow-up (middle finger R hand)   09/08/2017: Lisa [name] is a new patient presenting today for evaluation of pain in the RT third phalange, mainly between the MCP and PIP.  Pain has been present x several months.  No known injury or trauma. The pain is described as stinging and throbbing and is rated as 4/10 at rest but 10+/10 with flexion. Worsened with flexing the finger, slight swelling has been present.  Improves with rest Therapies tried include : She has been wearing a finger splint with minimal relief. She has tried taking Tylenol #3 with minimal relief.  Other associated symptoms include: Pain radiates into the palm of the hand and occasionally into the other fingers.  No recent xray  04/21/2018: Compared to the last office visit, her previously described symptoms are worsening. Sx started to flare up about 1.5 weeks ago. She started a new job 10/2017 which requires her to do a lot of typing.  Current symptoms are mild pulling at rest & are non-radiating. Pain can be severe when picking up objects. She has trouble bending her finger first thing in the morning. She has noticed some swelling in the joint.  She is not current medications or modalities.  She had steroid injection 09/08/2018 and got good relief.   ROS Denies night time disturbances. Denies fevers, chills, or night sweats. Denies unexplained weight loss. Denies personal history of cancer. Reports changes in bowel or bladder habits - loose stools. Denies recent unreported falls. Denies new or worsening dyspnea or wheezing. Reports HA  Reports numbness,  tingling or weakness in R hand. Denies dizziness or presyncopal episodes Denies lower extremity edema    HISTORY & PERTINENT PRIOR DATA:  Prior History reviewed and updated per electronic medical record.  Significant/pertinent history, findings, studies include:  reports that she has been smoking cigarettes.  She has smoked for the past 25.00 years. She has never used smokeless tobacco. Recent Labs    07/30/17 1446 04/12/18 1656  HGBA1C 6.3 6.5   No specialty comments available. No problems updated.  OBJECTIVE:  VS:  HT:5\' 5"  (165.1 cm)   WT:270 lb 6.4 oz (122.7 kg)  BMI:45    BP:124/82  HR:72bpm  TEMP: ( )  RESP:96 %   PHYSICAL EXAM: Constitutional: WDWN, Non-toxic appearing. Psychiatric: Alert & appropriately interactive.  Not depressed or anxious appearing. Respiratory: No increased work of breathing.  Trachea Midline Eyes: Pupils are equal.  EOM intact without nystagmus.  No scleral icterus  Vascular Exam: warm to touch no edema  upper extremity neuro exam: unremarkable normal strength normal sensation  MSK Exam: Pain over the A1 pulley of the R 3rd finger   ASSESSMENT & PLAN:   1. Pain of finger of right hand   2. Trigger middle finger of right hand     PLAN:  US guided injection.   Follow-up: Return in about 2 weeks (around 05/05/2018).      Please see additional documentation for Objective, Assessment and Plan sections. Pertinent additional documentation may be included in corresponding procedure notes, imaging studies, problem based documentation and patient instructions. Please  see these sections of the encounter for additional information regarding this visit.  CMA/ATC served as Education administrator during this visit. History, Physical, and Plan performed by medical provider. Documentation and orders reviewed and attested to.      Gerda Diss, [facility] Physician

## 2018-04-21 NOTE — Procedures (Signed)
PROCEDURE NOTE:  Ultrasound Guided: Injection: Right 3rd finger - A1 pulley Images were obtained and interpreted by myself, Teresa Coombs, DO  Images have been saved and stored to PACS system. Images obtained on: GE S7 Ultrasound machine    DESCRIPTION OF PROCEDURE:  The patient's clinical condition is marked by substantial pain and/or significant functional disability. Other conservative therapy has not provided relief, is contraindicated, or not appropriate. There is a reasonable likelihood that injection will significantly improve the patient's pain and/or functional impairment.   After discussing the risks, benefits and expected outcomes of the injection and all questions were reviewed and answered, the patient wished to undergo the above named procedure.  Verbal consent was obtained.  The ultrasound was used to identify the target structure and adjacent neurovascular structures. The skin was then prepped in sterile fashion and the target structure was injected under direct visualization using sterile technique as below:  PREP: Alcohol, Ethel Chloride and 1 cc 1% lidocaine on Insulin Needle APPROACH: direct, single injection, 25g 1.5 in. INJECTATE: 0.5 cc 1% lidocaine, 0.5 cc 0.5% Marcaine and 0.5 cc 40mg /mL DepoMedrol ASPIRATE: None DRESSING: Band-Aid  Post procedural instructions including recommending icing and warning signs for infection were reviewed.    This procedure was well tolerated and there were no complications.   IMPRESSION: Succesful Ultrasound Guided: Injection

## 2018-04-21 NOTE — Patient Instructions (Signed)

## 2018-04-27 ENCOUNTER — Ambulatory Visit (INDEPENDENT_AMBULATORY_CARE_PROVIDER_SITE_OTHER): Payer: Managed Care, Other (non HMO) | Admitting: Emergency Medicine

## 2018-04-27 ENCOUNTER — Encounter: Payer: Self-pay | Admitting: Emergency Medicine

## 2018-04-27 DIAGNOSIS — G4730 Sleep apnea, unspecified: Secondary | ICD-10-CM | POA: Diagnosis not present

## 2018-04-27 DIAGNOSIS — J84115 Respiratory bronchiolitis interstitial lung disease: Secondary | ICD-10-CM | POA: Diagnosis not present

## 2018-04-27 DIAGNOSIS — J449 Chronic obstructive pulmonary disease, unspecified: Secondary | ICD-10-CM | POA: Diagnosis not present

## 2018-04-27 NOTE — Patient Instructions (Signed)
You need to work hard on decreasing your smoking Keep albuterol available to use 2 puffs as needed for shortness of breath, wheezing, chest tightness.  We will discus the timing of a repeat CT scan of your chest at your next visit Please continue to use your CPAP every night Follow with Dr Lamonte Sakai in 6 months or sooner if you have any problems

## 2018-04-27 NOTE — Assessment & Plan Note (Signed)
Her bronchoscopy from 11/15/2017 showed normal pathology, negative cultures.  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  Unfortunately she continues to smoke despite my advice to her that this disease is almost directly correlated with her smoke exposure.  We talked a lot today about stopping.  She has a few barriers most of which include stressors in her life.  She is on albuterol that she uses rarely.  She has chronic dyspnea due to her pulmonary infiltrates and her obesity.  No new intervention to make beyond smoking cessation and following her clinically, radiographically.

## 2018-04-27 NOTE — Assessment & Plan Note (Signed)
Albuterol as needed 

## 2018-04-27 NOTE — Assessment & Plan Note (Signed)
Discuss her CPAP usage with her.  She is compliant.  She is clinically benefiting.  Minimal naps.  More energy during the daytime.  She gets her supplies regularly every 3 to 6 months.

## 2018-04-27 NOTE — Progress Notes (Signed)
Subjective:    Patient ID: Lisa [name], female    DOB: 07-Nov-1961, 57 y.o.   MRN: [id number]  Shortness of Breath  Pertinent negatives include no ear pain, fever, headaches, leg swelling, rash, rhinorrhea, sore throat, vomiting or wheezing.   ROV 07/30/17 -- follow-up visit for patient with a history of COPD and interstitial lung disease, mixed disease by spirometry. Her imaging has been most consistent with RB-ILD. Has been quite difficult for her to stop smoking. She still smokes approximately . A repeat CT scan of her chest was done on 07/15/17 that I have personally reviewed. This shows diffuse mosaic attenuation throughout both lungs consistent with a bronchiolitis. She has a stable 5 mm noncalcified right middle lobe nodule.  Her breathing is doing fairly well. She has a dry cough. She was on flonase, now off of it. Uses omeprazole qd  ROV 10/27/17 --patient is a 57 year old smoker with history of obstructive lung disease and diffuse interstitial lung disease by CT scan of the chest.  The imaging has been most consistent with RB-ILD although no biopsy has been performed.  She also has a noncalcified right middle lobe nodule which we have followed.  Her most recent scan from 07/15/17 showed diffuse scattered mosaic attenuation, probable bronchiolitis.  She was in the emergency department 10/15/17 for mid-CP for about 3-4 days; did not respond to Tums. Then she had acute SOB and pain when she laid flat the evening of admission. She improved but still has some fleeting, migratory mid CP.  She is on omeprazole.   A CT scan of the chest was done on that date which I have reviewed.  This shows diffuse, multi lobar, hazy bilateral airspace opacities that are certainly more prominent than her prior scan.  Borderline prominent bilateral hilar nodes up to 1.2 cm and stable right middle lobe nodule.  She has decreased her cigarettes to 5 cigarettes daily.  She was treated empirically for PNA, although no  fever, sputum, cough, etc.  We discussed options - I have recommenced that we attempt to find a tissue dx, probably start with FOB. May need to consider VATS bx at some point. She is nervous and upset, tearful with discussing.        ROV 04/27/18 --this is a follow-up visit for heavy smoker with history of obstructive lung disease and diffuse bilateral groundglass and interstitial infiltrates on CT scan of the chest.  We performed a bronchoscopy on 11/15/2017 that was negative for malignancy on biopsies and cytology, negative cultures.  Her last CT scan of the chest was 10/15/2017. She has been having exertional dyspnea, especially going uphill. Continues to smoke about 15 a day. She is concerned about all the stressors in her life - has been unable to cut down. She is reliable with her CPAP, gets a good clinical response. Minimal daytime sleepiness. She has albuterol available, uses it very rarely.     Review of Systems  Constitutional: Negative for fever and unexpected weight change.  HENT: Negative for congestion, dental problem, ear pain, nosebleeds, postnasal drip, rhinorrhea, sinus pressure, sneezing, sore throat and trouble swallowing.   Eyes: Negative for redness and itching.  Respiratory: Positive for cough. Negative for chest tightness, shortness of breath and wheezing.   Cardiovascular: Negative for palpitations and leg swelling.  Gastrointestinal: Negative for nausea and vomiting.  Genitourinary: Negative for dysuria.  Musculoskeletal: Negative for joint swelling.  Skin: Negative for rash.  Neurological: Negative for headaches.  Hematological: Does not bruise/bleed  easily.  Psychiatric/Behavioral: Negative for dysphoric mood. The patient is not nervous/anxious.         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  Unfortunately she continues to smoke despite my advice to her that this disease is almost directly correlated with her smoke exposure.  We talked a lot today about stopping.  She has a few barriers most of which include stressors in her life.  She is on albuterol that she uses rarely.  She has chronic dyspnea due to her pulmonary infiltrates and her obesity.  No new intervention to make beyond smoking cessation and following her clinically, radiographically.  COPD (chronic obstructive pulmonary disease) (HCC) Albuterol as needed  Sleep apnea Discuss her CPAP usage with her.  She is compliant.  She is clinically benefiting.  Minimal naps.  More energy during the daytime.  She gets her supplies regularly every 3 to 6 months.  Baltazar Apo, MD, PhD 04/27/2018, 5:44 PM [facility] [phone] or if no answer [phone]

## 2018-05-01 ENCOUNTER — Encounter: Payer: Self-pay | Admitting: Sports Medicine

## 2018-05-09 ENCOUNTER — Ambulatory Visit: Payer: Managed Care, Other (non HMO) | Admitting: Sports Medicine

## 2018-05-09 DIAGNOSIS — Z0289 Encounter for other administrative examinations: Secondary | ICD-10-CM

## 2018-05-10 ENCOUNTER — Encounter: Payer: Self-pay | Admitting: Sports Medicine

## 2018-05-13 ENCOUNTER — Telehealth: Payer: Self-pay | Admitting: Gastroenterology

## 2018-05-13 MED ORDER — DICYCLOMINE HCL 10 MG PO CAPS: 10.0000 mg | ORAL_CAPSULE | Freq: Three times a day (TID) | ORAL | 2 refills | Status: DC

## 2018-05-13 NOTE — Telephone Encounter (Signed)
rx refill sent and follow up scheduled for 07/28/18

## 2018-05-24 ENCOUNTER — Encounter: Payer: Self-pay | Admitting: Women's Health

## 2018-05-24 ENCOUNTER — Ambulatory Visit (INDEPENDENT_AMBULATORY_CARE_PROVIDER_SITE_OTHER): Payer: Managed Care, Other (non HMO) | Admitting: Women's Health

## 2018-05-24 VITALS — BP 128/80 | Ht 65.0 in | Wt 275.0 lb

## 2018-05-24 DIAGNOSIS — Z01419 Encounter for gynecological examination (general) (routine) without abnormal findings: Secondary | ICD-10-CM | POA: Diagnosis not present

## 2018-05-24 NOTE — Progress Notes (Signed)
lab

## 2018-05-24 NOTE — Patient Instructions (Signed)
Health Maintenance for Postmenopausal Women Menopause is a normal process in which your reproductive ability comes to an end. This process happens gradually over a span of months to years, usually between the ages of 22 and 9. Menopause is complete when you have missed 12 consecutive menstrual periods. It is important to talk with your health care provider about some of the most common conditions that affect postmenopausal women, such as heart disease, cancer, and bone loss (osteoporosis). Adopting a healthy lifestyle and getting preventive care can help to promote your health and wellness. Those actions can also lower your chances of developing some of these common conditions. What should I know about menopause? During menopause, you may experience a number of symptoms, such as:  Moderate-to-severe hot flashes.  Night sweats.  Decrease in sex drive.  Mood swings.  Headaches.  Tiredness.  Irritability.  Memory problems.  Insomnia.  Choosing to treat or not to treat menopausal changes is an individual decision that you make with your health care provider. What should I know about hormone replacement therapy and supplements? Hormone therapy products are effective for treating symptoms that are associated with menopause, such as hot flashes and night sweats. Hormone replacement carries certain risks, especially as you become older. If you are thinking about using estrogen or estrogen with progestin treatments, discuss the benefits and risks with your health care provider. What should I know about heart disease and stroke? Heart disease, heart attack, and stroke become more likely as you age. This may be due, in part, to the hormonal changes that your body experiences during menopause. These can affect how your body processes dietary fats, triglycerides, and cholesterol. Heart attack and stroke are both medical emergencies. There are many things that you can do to help prevent heart disease  and stroke:  Have your blood pressure checked at least every 1-2 years. High blood pressure causes heart disease and increases the risk of stroke.  If you are 53-22 years old, ask your health care provider if you should take aspirin to prevent a heart attack or a stroke.  Do not use any tobacco products, including cigarettes, chewing tobacco, or electronic cigarettes. If you need help quitting, ask your health care provider.  It is important to eat a healthy diet and maintain a healthy weight. ? Be sure to include plenty of vegetables, fruits, low-fat dairy products, and lean protein. ? Avoid eating foods that are high in solid fats, added sugars, or salt (sodium).  Get regular exercise. This is one of the most important things that you can do for your health. ? Try to exercise for at least 150 minutes each week. The type of exercise that you do should increase your heart rate and make you sweat. This is known as moderate-intensity exercise. ? Try to do strengthening exercises at least twice each week. Do these in addition to the moderate-intensity exercise.  Know your numbers.Ask your health care provider to check your cholesterol and your blood glucose. Continue to have your blood tested as directed by your health care provider.  What should I know about cancer screening? There are several types of cancer. Take the following steps to reduce your risk and to catch any cancer development as early as possible. Breast Cancer  Practice breast self-awareness. ? This means understanding how your breasts normally appear and feel. ? It also means doing regular breast self-exams. Let your health care provider know about any changes, no matter how small.  If you are 40  or older, have a clinician do a breast exam (clinical breast exam or CBE) every year. Depending on your age, family history, and medical history, it may be recommended that you also have a yearly breast X-ray (mammogram).  If you  have a family history of breast cancer, talk with your health care provider about genetic screening.  If you are at high risk for breast cancer, talk with your health care provider about having an MRI and a mammogram every year.  Breast cancer (BRCA) gene test is recommended for women who have family members with BRCA-related cancers. Results of the assessment will determine the need for genetic counseling and BRCA1 and for BRCA2 testing. BRCA-related cancers include these types: ? Breast. This occurs in males or females. ? Ovarian. ? Tubal. This may also be called fallopian tube cancer. ? Cancer of the abdominal or pelvic lining (peritoneal cancer). ? Prostate. ? Pancreatic.  Cervical, Uterine, and Ovarian Cancer Your health care provider may recommend that you be screened regularly for cancer of the pelvic organs. These include your ovaries, uterus, and vagina. This screening involves a pelvic exam, which includes checking for microscopic changes to the surface of your cervix (Pap test).  For women ages 21-65, health care providers may recommend a pelvic exam and a Pap test every three years. For women ages 79-65, they may recommend the Pap test and pelvic exam, combined with testing for human papilloma virus (HPV), every five years. Some types of HPV increase your risk of cervical cancer. Testing for HPV may also be done on women of any age who have unclear Pap test results.  Other health care providers may not recommend any screening for nonpregnant women who are considered low risk for pelvic cancer and have no symptoms. Ask your health care provider if a screening pelvic exam is right for you.  If you have had past treatment for cervical cancer or a condition that could lead to cancer, you need Pap tests and screening for cancer for at least 20 years after your treatment. If Pap tests have been discontinued for you, your risk factors (such as having a new sexual partner) need to be  reassessed to determine if you should start having screenings again. Some women have medical problems that increase the chance of getting cervical cancer. In these cases, your health care provider may recommend that you have screening and Pap tests more often.  If you have a family history of uterine cancer or ovarian cancer, talk with your health care provider about genetic screening.  If you have vaginal bleeding after reaching menopause, tell your health care provider.  There are currently no reliable tests available to screen for ovarian cancer.  Lung Cancer Lung cancer screening is recommended for adults 69-62 years old who are at high risk for lung cancer because of a history of smoking. A yearly low-dose CT scan of the lungs is recommended if you:  Currently smoke.  Have a history of at least 30 pack-years of smoking and you currently smoke or have quit within the past 15 years. A pack-year is smoking an average of one pack of cigarettes per day for one year.  Yearly screening should:  Continue until it has been 15 years since you quit.  Stop if you develop a health problem that would prevent you from having lung cancer treatment.  Colorectal Cancer  This type of cancer can be detected and can often be prevented.  Routine colorectal cancer screening usually begins at  age 42 and continues through age 45.  If you have risk factors for colon cancer, your health care provider may recommend that you be screened at an earlier age.  If you have a family history of colorectal cancer, talk with your health care provider about genetic screening.  Your health care provider may also recommend using home test kits to check for hidden blood in your stool.  A small camera at the end of a tube can be used to examine your colon directly (sigmoidoscopy or colonoscopy). This is done to check for the earliest forms of colorectal cancer.  Direct examination of the colon should be repeated every  5-10 years until age 71. However, if early forms of precancerous polyps or small growths are found or if you have a family history or genetic risk for colorectal cancer, you may need to be screened more often.  Skin Cancer  Check your skin from head to toe regularly.  Monitor any moles. Be sure to tell your health care provider: ? About any new moles or changes in moles, especially if there is a change in a mole's shape or color. ? If you have a mole that is larger than the size of a pencil eraser.  If any of your family members has a history of skin cancer, especially at a young age, talk with your health care provider about genetic screening.  Always use sunscreen. Apply sunscreen liberally and repeatedly throughout the day.  Whenever you are outside, protect yourself by wearing long sleeves, pants, a wide-brimmed hat, and sunglasses.  What should I know about osteoporosis? Osteoporosis is a condition in which bone destruction happens more quickly than new bone creation. After menopause, you may be at an increased risk for osteoporosis. To help prevent osteoporosis or the bone fractures that can happen because of osteoporosis, the following is recommended:  If you are 46-71 years old, get at least 1,000 mg of calcium and at least 600 mg of vitamin D per day.  If you are older than age 55 but younger than age 65, get at least 1,200 mg of calcium and at least 600 mg of vitamin D per day.  If you are older than age 54, get at least 1,200 mg of calcium and at least 800 mg of vitamin D per day.  Smoking and excessive alcohol intake increase the risk of osteoporosis. Eat foods that are rich in calcium and vitamin D, and do weight-bearing exercises several times each week as directed by your health care provider. What should I know about how menopause affects my mental health? Depression may occur at any age, but it is more common as you become older. Common symptoms of depression  include:  Low or sad mood.  Changes in sleep patterns.  Changes in appetite or eating patterns.  Feeling an overall lack of motivation or enjoyment of activities that you previously enjoyed.  Frequent crying spells.  Talk with your health care provider if you think that you are experiencing depression. What should I know about immunizations? It is important that you get and maintain your immunizations. These include:  Tetanus, diphtheria, and pertussis (Tdap) booster vaccine.  Influenza every year before the flu season begins.  Pneumonia vaccine.  Shingles vaccine.  Your health care provider may also recommend other immunizations. This information is not intended to replace advice given to you by your health care provider. Make sure you discuss any questions you have with your health care provider. Document Released: 01/08/2006  Document Revised: 06/05/2016 Document Reviewed: 08/20/2015 Elsevier Interactive Patient Education  2018 [employer] Carbohydrate Counting for Diabetes Mellitus, Adult Carbohydrate counting is a method for keeping track of how many carbohydrates you eat. Eating carbohydrates naturally increases the amount of sugar (glucose) in the blood. Counting how many carbohydrates you eat helps keep your blood glucose within normal limits, which helps you manage your diabetes (diabetes mellitus). It is important to know how many carbohydrates you can safely have in each meal. This is different for every person. A diet and nutrition specialist (registered dietitian) can help you make a meal plan and calculate how many carbohydrates you should have at each meal and snack. Carbohydrates are found in the following foods:  Grains, such as breads and cereals.  Dried beans and soy products.  Starchy vegetables, such as potatoes, peas, and corn.  Fruit and fruit juices.  Milk and yogurt.  Sweets and snack foods, such as cake, cookies, candy, chips, and soft  drinks.  How do I count carbohydrates? There are two ways to count carbohydrates in food. You can use either of the methods or a combination of both. Reading "Nutrition Facts" on packaged food The "Nutrition Facts" list is included on the labels of almost all packaged foods and beverages in the [place] It includes:  The serving size.  Information about nutrients in each serving, including the grams (g) of carbohydrate per serving.  To use the "Nutrition Facts":  Decide how many servings you will have.  Multiply the number of servings by the number of carbohydrates per serving.  The resulting number is the total amount of carbohydrates that you will be having.  Learning standard serving sizes of other foods When you eat foods containing carbohydrates that are not packaged or do not include "Nutrition Facts" on the label, you need to measure the servings in order to count the amount of carbohydrates:  Measure the foods that you will eat with a food scale or measuring cup, if needed.  Decide how many standard-size servings you will eat.  Multiply the number of servings by 15. Most carbohydrate-rich foods have about 15 g of carbohydrates per serving. ? For example, if you eat 8 oz (170 g) of strawberries, you will have eaten 2 servings and 30 g of carbohydrates (2 servings x 15 g = 30 g).  For foods that have more than one food mixed, such as soups and casseroles, you must count the carbohydrates in each food that is included.  The following list contains standard serving sizes of common carbohydrate-rich foods. Each of these servings has about 15 g of carbohydrates:   hamburger bun or  English muffin.   oz (15 mL) syrup.   oz (14 g) jelly.  1 slice of bread.  1 six-inch tortilla.  3 oz (85 g) cooked rice or pasta.  4 oz (113 g) cooked dried beans.  4 oz (113 g) starchy vegetable, such as peas, corn, or potatoes.  4 oz (113 g) hot cereal.  4 oz (113 g) mashed potatoes  or  of a large baked potato.  4 oz (113 g) canned or frozen fruit.  4 oz (120 mL) fruit juice.  4-6 crackers.  6 chicken nuggets.  6 oz (170 g) unsweetened dry cereal.  6 oz (170 g) plain fat-free yogurt or yogurt sweetened with artificial sweeteners.  8 oz (240 mL) milk.  8 oz (170 g) fresh fruit or one small piece of fruit.  24 oz (680 g) popped   popcorn.  Example of carbohydrate counting Sample meal  3 oz (85 g) chicken breast.  6 oz (170 g) brown rice.  4 oz (113 g) corn.  8 oz (240 mL) milk.  8 oz (170 g) strawberries with sugar-free whipped topping. Carbohydrate calculation 1. Identify the foods that contain carbohydrates: ? Rice. ? Corn. ? Milk. ? Strawberries. 2. Calculate how many servings you have of each food: ? 2 servings rice. ? 1 serving corn. ? 1 serving milk. ? 1 serving strawberries. 3. Multiply each number of servings by 15 g: ? 2 servings rice x 15 g = 30 g. ? 1 serving corn x 15 g = 15 g. ? 1 serving milk x 15 g = 15 g. ? 1 serving strawberries x 15 g = 15 g. 4. Add together all of the amounts to find the total grams of carbohydrates eaten: ? 30 g + 15 g + 15 g + 15 g = 75 g of carbohydrates total. This information is not intended to replace advice given to you by your health care provider. Make sure you discuss any questions you have with your health care provider. Document Released: 11/16/2005 Document Revised: 06/05/2016 Document Reviewed: 04/29/2016 Elsevier Interactive Patient Education  2018 [employer] Inc[employer]  

## 2018-05-24 NOTE — Progress Notes (Signed)
Lisa [name] 55-Jul-1962 [id number]    History:    Presents for new established patient for annual exam.  Had first child delivered by our office who is now age 57, second child age 84 [facility].  Reports normal Paps.  Normal mammogram history.  04/2012 benign colon polyp 5-year follow-up scheduled next month with Dr. Fuller Plan.  Primary care manages hypertension, diabetes and COPD.  Smokes several cigarettes daily currently on Wellbutrin, trying to quit.  Postmenopausal on no HRT with no bleeding, same partner.  Past medical history, past surgical history, family history and social history were all reviewed and documented in the EPIC chart.  Works at [employer].  Graduated from  [employer] but was unable to find a job.  21 year old son morbidly obese and has heart issue.  ROS:  A ROS was performed and pertinent positives and negatives are included.  Exam:  Vitals:   05/24/18 1520  BP: 128/80  Weight: 275 lb (124.7 kg)  Height: 5\' 5"  (1.651 m)   Body mass index is 45.76 kg/m.   General appearance:  Normal Thyroid:  Symmetrical, normal in size, without palpable masses or nodularity. Respiratory  Auscultation:  Clear without wheezing or rhonchi Cardiovascular  Auscultation:  Regular rate, without rubs, murmurs or gallops  Edema/varicosities:  Not grossly evident Abdominal  Soft,nontender, without masses, guarding or rebound.  Liver/spleen:  No organomegaly noted  Hernia:  None appreciated  Skin  Inspection:  Grossly normal   Breasts: Examined lying and sitting.     Right: Without masses, retractions, discharge or axillary adenopathy.     Left: Without masses, retractions, discharge or axillary adenopathy. Gentitourinary   Inguinal/mons:  Normal without inguinal adenopathy  External genitalia:  Normal  BUS/Urethra/Skene's glands:  Normal  Vagina:  Normal  Cervix:  Normal  Uterus:  normal in size, shape and contour.  Midline and  mobile  Adnexa/parametria:     Rt: Without masses or tenderness.   Lt: Without masses or tenderness.  Anus and perineum: Normal  Digital rectal exam: Normal sphincter tone without palpated masses or tenderness  Assessment/Plan:  57 y.o. MB FG4P2 for annual exam with no GYN complaints.  Postmenopausal/no HRT/no bleeding with occasional hot flashes. Morbid obesity Smoker attempting to quit Hypertension, diabetes, COPD primary care managing labs and meds  Plan: Reports good sleep, reviewed vitamin E twice daily may help with hot flushes, reviewed risks of blood clots, strokes and breast cancer with HRT use.  SBEs, continue annual mammograms.  Reviewed importance of increasing exercise, decreasing calorie/carbs for weight loss.  Pap with HR HPV typing.    [place], 4:39 PM 05/24/2018

## 2018-05-26 LAB — PAP, TP IMAGING W/ HPV RNA, RFLX HPV TYPE 16,18/45: HPV DNA High Risk: NOT DETECTED

## 2018-05-27 LAB — HM DIABETES EYE EXAM

## 2018-05-31 ENCOUNTER — Encounter: Payer: Self-pay | Admitting: Physician Assistant

## 2018-05-31 ENCOUNTER — Ambulatory Visit (INDEPENDENT_AMBULATORY_CARE_PROVIDER_SITE_OTHER): Payer: Managed Care, Other (non HMO) | Admitting: Physician Assistant

## 2018-05-31 ENCOUNTER — Ambulatory Visit: Payer: Self-pay

## 2018-05-31 VITALS — BP 130/80 | HR 71 | Temp 98.5°F | Ht 65.0 in | Wt 273.0 lb

## 2018-05-31 DIAGNOSIS — E1165 Type 2 diabetes mellitus with hyperglycemia: Secondary | ICD-10-CM

## 2018-05-31 LAB — CBC
HCT: 42.1 % (ref 36.0–46.0)
HEMOGLOBIN: 14.4 g/dL (ref 12.0–15.0)
MCHC: 34.3 g/dL (ref 30.0–36.0)
MCV: 88.6 fl (ref 78.0–100.0)
Platelets: 270 10*3/uL (ref 150.0–400.0)
RBC: 4.75 Mil/uL (ref 3.87–5.11)
RDW: 14.4 % (ref 11.5–15.5)
WBC: 7.1 10*3/uL (ref 4.0–10.5)

## 2018-05-31 LAB — COMPREHENSIVE METABOLIC PANEL
ALT: 14 U/L (ref 0–35)
AST: 11 U/L (ref 0–37)
Albumin: 4.1 g/dL (ref 3.5–5.2)
Alkaline Phosphatase: 80 U/L (ref 39–117)
BILIRUBIN TOTAL: 0.5 mg/dL (ref 0.2–1.2)
BUN: 22 mg/dL (ref 6–23)
CO2: 25 meq/L (ref 19–32)
Calcium: 10 mg/dL (ref 8.4–10.5)
Chloride: 105 mEq/L (ref 96–112)
Creatinine, Ser: 1.01 mg/dL (ref 0.40–1.20)
GFR: 73.26 mL/min (ref >60.00)
GLUCOSE: 74 mg/dL (ref 70–99)
Potassium: 4.2 mEq/L (ref 3.5–5.1)
Sodium: 138 mEq/L (ref 135–145)
Total Protein: 7.3 g/dL (ref 6.0–8.3)

## 2018-05-31 LAB — POCT GLUCOSE (DEVICE FOR HOME USE): POC GLUCOSE: 78 mg/dL (ref 70–99)

## 2018-05-31 MED ORDER — METFORMIN HCL ER 750 MG PO TB24: 1500.0000 mg | ORAL_TABLET | Freq: Every day | ORAL | 1 refills | Status: DC

## 2018-05-31 NOTE — Progress Notes (Signed)
Lisa [name] is a 57 y.o. female here for a elevated glucose readings.  I acted as a Education administrator for [employer], PA-C Lisa Pickler, LPN  History of Present Illness:   Chief Complaint  Patient presents with  . Hyperglycemia    episode today    Diabetes  She presents for her follow-up (Pt c/o elevated blood sugars today fastng was 142 at 8:30 Am and then was eating breakfast and started to feel out of it and dizzy this was around 10:00 AM pt checked blood sugar again and it was 247) diabetic visit. She has type 2 diabetes mellitus. Hypoglycemia symptoms include confusion, dizziness, headaches and sleepiness. Pertinent negatives for hypoglycemia include no speech difficulty. (This happened around 10:00 am this morning.) Associated symptoms include blurred vision. Pertinent negatives for diabetes include no chest pain. There are no hypoglycemic complications. Risk factors for coronary artery disease include obesity, diabetes mellitus, post-menopausal, hypertension and dyslipidemia. Current diabetic treatment includes oral agent (monotherapy). She is compliant with treatment all of the time. Her weight is stable. She is following a low salt diet. Meal planning includes avoidance of concentrated sweets and calorie counting. She has had a previous visit with a dietitian. Exercise: walking into work. Her home blood glucose trend is increasing steadily. An ACE inhibitor/angiotensin II receptor blocker is being taken. She does not see a podiatrist.Eye exam is current.   Was on Victoza for 4 months after she was initially diagnosed with diabetes. Was switched to Metformin [name] to cost. She was seeing Lisa Noon [facility] but she has not returned for scheduled follow-up because she states that she has a bad debt balance with them and they won't let her re-schedule. She is currently running low on her Metformin and for the past two days has only taken 1 tablet instead of two.  Additionally, this morning, she consumed higher sugar yogurt than normal. Last night had a regular vitamin water, instead of a zero cal vitamin water.  She states that for the past 3 weeks she has been having "explosive diarrhea" that she attributes to the metformin.   Lab Results  Component Value Date   HGBA1C 6.5 04/12/2018     Wt Readings from Last 5 Encounters:  05/31/18 273 lb (123.8 kg)  05/24/18 275 lb (124.7 kg)  04/27/18 273 lb 9.6 oz (124.1 kg)  04/21/18 270 lb 6.4 oz (122.7 kg)  04/12/18 273 lb 3.2 oz (123.9 kg)      Past Medical History:  Diagnosis Date  . Allergic rhinitis   . Asthma   . Breast nodule    right breast, to see dr Helane Rima 06-20-2012 for   . Chest pain, non-cardiac   . Chronic headaches   . Cigarette smoker   . Complication of anesthesia 05-17-2012   trouble breathing after colonscopy, needed nebulizer  . Diverticulosis   . GERD (gastroesophageal reflux disease)   . Hepatic hemangioma   . Hypertension   . IBS (irritable bowel syndrome)   . Obesity   . Prosthetic eye globe   . Pyloric erosion   . Sleep apnea    CPAP setting varies from 4-10  . Type 2 diabetes mellitus ([place]) 03/11/2017     Social History   Socioeconomic History  . Marital status: Married    Spouse name: Science writer  . Number of children: 2  . Years of education: 14+  . Highest education level: Not on file  Occupational History  . Occupation: stay at  home mom  . Occupation: Ship broker  Social Needs  . Financial resource strain: Not on file  . Food insecurity:    Worry: Not on file    Inability: Not on file  . Transportation needs:    Medical: Not on file    Non-medical: Not on file  Tobacco Use  . Smoking status: Current Every Day Smoker    Years: 25.00    Types: Cigarettes  . Smokeless tobacco: Never Used  . Tobacco comment: 10 cigarettes per day  Substance and Sexual Activity  . Alcohol use: No    Alcohol/week: 0.0 oz  . Drug use: No  . Sexual activity: Yes     Comment: intercourse age 76, less than 5 sexual partners,des neg  Lifestyle  . Physical activity:    Days per week: Not on file    Minutes per session: Not on file  . Stress: Not on file  Relationships  . Social connections:    Talks on phone: Not on file    Gets together: Not on file    Attends religious service: Not on file    Active member of club or organization: Not on file    Attends meetings of clubs or organizations: Not on file    Relationship status: Not on file  . Intimate partner violence:    Fear of current or ex partner: Not on file    Emotionally abused: Not on file    Physically abused: Not on file    Forced sexual activity: Not on file  Other Topics Concern  . Not on file  Social History Narrative   Married 1997. 2 sons 98 and 62 years old in 10/18. Wants to see children grow up.       Customer Service/accounts payable.    Went to school for medical assisting- has had a hard time finding work      Hobbies: [employer], enjoying [place], getting out of home, wants to take [employer]    Past Surgical History:  Procedure Laterality Date  . ABDOMINAL AORTOGRAM W/LOWER EXTREMITY N/A 04/30/2017   Procedure: Abdominal Aortogram w/Lower Extremity;  Surgeon: Elam Dutch, MD;  Location: [facility];  Service: Cardiovascular;  Laterality: N/A;  . CARDIAC CATHETERIZATION  12/28/2007   EF 75%. IT REVEALS NORMAL/SUPRANORMAL LEFT VENTRICULAR SYSTOLIC FUNCTION  . CARDIOVASCULAR STRESS TEST  03/25/2007   EF 78%  . CESAREAN SECTION  2003, 2009   x 2  . CHOLECYSTECTOMY  1993  . COLONOSCOPY  2014  . ENDOMETRIAL ABLATION  09/2010   and D&C  . HERNIA REPAIR  9509   umbilical  . LAPAROSCOPY    . PERIPHERAL VASCULAR BALLOON ANGIOPLASTY Left 04/30/2017   Procedure: Peripheral Vascular Balloon Angioplasty;  Surgeon: Elam Dutch, MD;  Location: [facility];  Service: Cardiovascular;  Laterality: Left;  SFA  . TUBAL LIGATION    . UMBILICAL HERNIA REPAIR  2004  .  US ECHOCARDIOGRAPHY  05/08/2008   EF 55-60%  . VENTRAL HERNIA REPAIR  06/15/2012   Procedure: LAPAROSCOPIC VENTRAL HERNIA;  Surgeon: Edward Jolly, MD;  Location: [facility];  Service: General;  Laterality: N/A;  LAPAROSCOPIC REPAIR VENTRAL HERNIA  . VIDEO BRONCHOSCOPY Bilateral 11/15/2017   Procedure: VIDEO BRONCHOSCOPY WITH FLUORO;  Surgeon: Collene Gobble, MD;  Location: [facility];  Service: Cardiopulmonary;  Laterality: Bilateral;    Family History  Problem Relation Age of Onset  . Hypertension Mother   . Lung cancer Mother  66  . Diabetes Maternal Grandmother   . Heart disease Father        pacemaker or defibrillator  . Heart attack Paternal Uncle   . Breast cancer Unknown   . Colon cancer Neg Hx     Allergies  Allergen Reactions  . Almond Oil Anaphylaxis and Nausea And Vomiting    Almond-Nuts Only  . Apple Anaphylaxis  . Ceftin [Cefuroxime] Other (See Comments)    Severe stomach pain - Diverticulitis flares up  . Cherry Extract Anaphylaxis    Cherry fruit only   . Other Anaphylaxis and Itching    Most fruits- Are NOT tolerated (PLEASE ASK PATIENT BEFORE GIVING, AS CERTAIN FRUITS ARE TOLERATED IN LIMITED QUANTITIES!!)  . Guaifenesin & Derivatives Other (See Comments)    Restlessness, jittery  . Hctz [Hydrochlorothiazide] Other (See Comments)    Causes a headache  . Morphine And Related Other (See Comments)    Moodiness.  Causes anger.  . Naproxen Other (See Comments)    Insomnia  . Lisinopril Cough         Current Medications:   Current Outpatient Medications:  .  albuterol (PROVENTIL HFA;VENTOLIN HFA) 108 (90 Base) MCG/ACT inhaler, Inhale 2 puffs into the lungs every 6 (six) hours as needed for wheezing or shortness of breath., Disp: 1 Inhaler, Rfl: 3 .  aspirin EC 81 MG tablet, Take 81 mg by mouth daily., Disp: , Rfl:  .  atorvastatin (LIPITOR) 40 MG tablet, TAKE 1 TABLET (40 MG TOTAL) BY MOUTH DAILY., Disp: 90 tablet, Rfl: 3 .  buPROPion  (WELLBUTRIN SR) 150 MG 12 hr tablet, Take 150 mg by mouth in the morning, Disp: , Rfl: 0 .  carvedilol (COREG) 25 MG tablet, TAKE 1 TABLET (25 MG TOTAL) BY MOUTH 2 (TWO) TIMES DAILY., Disp: 180 tablet, Rfl: 3 .  dicyclomine (BENTYL) 10 MG capsule, Take 1 capsule (10 mg total) by mouth 3 (three) times daily before meals. As needed, Disp: 90 capsule, Rfl: 2 .  losartan (COZAAR) 100 MG tablet, Take 1 tablet (100 mg total) by mouth daily., Disp: 90 tablet, Rfl: 1 .  omeprazole (PRILOSEC) 40 MG capsule, Take 1 capsule (40 mg total) by mouth daily., Disp: , Rfl:  .  ONE TOUCH LANCETS MISC, Use to check blood sugars twice a day, Disp: 200 each, Rfl: 0 .  spironolactone (ALDACTONE) 50 MG tablet, TAKE 1 TABLET (50 MG TOTAL) BY MOUTH DAILY., Disp: 90 tablet, Rfl: 3 .  doxazosin (CARDURA) 2 MG tablet, TAKE 1 TABLET (2 MG TOTAL) BY MOUTH 2 (TWO) TIMES DAILY. (Patient not taking: Reported on 05/31/2018), Disp: 180 tablet, Rfl: 2 .  metFORMIN (GLUCOPHAGE-XR) 750 MG 24 hr tablet, Take 2 tablets (1,500 mg total) by mouth daily with breakfast., Disp: 60 tablet, Rfl: 1   Review of Systems:   Review of Systems  Eyes: Positive for blurred vision.  Cardiovascular: Negative for chest pain.  Neurological: Positive for dizziness and headaches. Negative for speech difficulty.  Psychiatric/Behavioral: Positive for confusion.    Vitals:   Vitals:   05/31/18 1322  BP: 130/80  Pulse: 71  Temp: 98.5 F (36.9 C)  TempSrc: Oral  SpO2: 96%  Weight: 273 lb (123.8 kg)  Height: 5\' 5"  (1.651 m)     Body mass index is 45.43 kg/m.  Physical Exam:   Physical Exam  Constitutional: She appears well-developed. She is cooperative.  Non-toxic appearance. She does not have a sickly appearance. She does not appear ill. No distress.  Cardiovascular:  Normal rate, regular rhythm, S1 normal, S2 normal, normal heart sounds and normal pulses.  No LE edema  Pulmonary/Chest: Effort normal and breath sounds normal.  Neurological:  She is alert. She has normal strength. No cranial nerve deficit or sensory deficit. GCS eye subscore is 4. GCS verbal subscore is 5. GCS motor subscore is 6.  Skin: Skin is warm, dry and intact.  Psychiatric: She has a normal mood and affect. Her speech is normal and behavior is normal.  Nursing note and vitals reviewed.  Results for orders placed or performed in visit on 05/31/18  POCT Glucose (Device for Home Use)  Result Value Ref Range   Glucose Fasting, POC  70 - 99 mg/dL   POC Glucose 78 70 - 99 mg/dl    Assessment and Plan:    Sahirah was seen today for hyperglycemia.  Diagnoses and all orders for this visit:  Type 2 diabetes mellitus with hyperglycemia, without long-term current use of insulin ([place]) Exam today benign. POCT glucose is 78. Will have her discontinue 2000 mg regular metformin and change to 1500 mg metformin XR to promote better GI tolerance. Will have her follow-up with PCP, Dr. Yong Channel, in 6 weeks to have HgbA1c re-checked. She is inquiring about switching to Victoza in the future, will defer to PCP. Provided ideal blood sugar parameters on AVS, recommended she call if she has significant highs and lows. Patient verbalized understanding.  -     POCT Glucose (Device for Home Use) -     CBC -     Comprehensive metabolic panel  She reports that she had DM eye exam within the past week [facility]. Will have nursing staff to call and request this record for our documentation.   Other orders -     metFORMIN (GLUCOPHAGE-XR) 750 MG 24 hr tablet; Take 2 tablets (1,500 mg total) by mouth daily with breakfast.   . Reviewed expectations re: course of current medical issues. . Discussed self-management of symptoms. . Outlined signs and symptoms indicating need for more acute intervention. . Patient verbalized understanding and all questions were answered. . See orders for this visit as documented in the electronic medical record. . Patient received an  After-Visit Summary.   CMA or LPN served as scribe during this visit. History, Physical, and Plan performed by medical provider. Documentation and orders reviewed and attested to.   Inda Coke, PA-C

## 2018-05-31 NOTE — Patient Instructions (Addendum)
Start Metformin XR 750 mg (may take all at once) starting tomorrow. Take approximately 15-20 min after a meal.  Follow-up with Dr. Yong Channel after Jul 13 2018 so we can re-check your HgbA1c.  Check your blood sugar twice a day.  Vary the time of day when you check, between before the 3 meals, and at bedtime.    Also check if you have symptoms of your blood sugar being too high or too low.  Please keep a record of the readings and bring it to your next appointment here.  You can write it on any piece of paper.    Please call us sooner if your blood sugar goes below 70, or if you have a lot of readings over 200.

## 2018-05-31 NOTE — Telephone Encounter (Signed)
Pt. Concerned about blood sugar. 142 before breakfast. Ate 3 egg whites, yogurt, pita .Checked 1 hour later and it was 247. Had a headache and felt dizzy. Still has symptoms. Reports BS run 129-140. "Never been this high." Had pt. Recheck and BS 250. Appointment scheduled for today.Instructed to continue to drink water and if symptoms worsen to go to ED. Verbalizes understanding. Reason for Disposition . [1] Symptoms of high blood sugar (e.g., frequent urination, weak, weight loss) AND [2] not able to test blood glucose  Answer Assessment - Initial Assessment Questions 1. BLOOD GLUCOSE: "What is your blood glucose level?"      Before breakfast 142  After breakfast is was 247  She felt dizzy and had a headache   2. ONSET: "When did you check the blood glucose?"     0815   And again  1000 3. USUAL RANGE: "What is your glucose level usually?" (e.g., usual fasting morning value, usual evening value)     129-140 4. KETONES: "Do you check for ketones (urine or blood test strips)?" If yes, ask: "What does the test show now?"      No 5. TYPE 1 or 2:  "Do you know what type of diabetes you have?"  (e.g., Type 1, Type 2, Gestational; doesn't know)      Type 2 6. INSULIN: "Do you take insulin?" "What type of insulin(s) do you use? What is the mode of delivery? (syringe, pen (e.g., injection or  pump)?"      No 7. DIABETES PILLS: "Do you take any pills for your diabetes?" If yes, ask: "Have you missed taking any pills recently?"     No missed doses 8. OTHER SYMPTOMS: "Do you have any symptoms?" (e.g., fever, frequent urination, difficulty breathing, dizziness, weakness, vomiting)     Has a headache and a little dizzy 9. PREGNANCY: "Is there any chance you are pregnant?" "When was your last menstrual period?"     No  Protocols used: DIABETES - HIGH BLOOD SUGAR-A-AH

## 2018-06-03 ENCOUNTER — Encounter: Payer: Self-pay | Admitting: Family Medicine

## 2018-06-15 ENCOUNTER — Other Ambulatory Visit: Payer: Self-pay | Admitting: Family

## 2018-06-20 ENCOUNTER — Ambulatory Visit: Payer: Self-pay

## 2018-06-20 NOTE — Telephone Encounter (Signed)
See note

## 2018-06-20 NOTE — Telephone Encounter (Signed)
Pt. called to report frequent episodes of diarrhea and abdominal cramping for past month.  Stated this correlates with starting on Metformin.  Denied being on an antibiotic recently. Reported stools vary from "jello or pudding consistency to watery consistency."  Reported "the diarrhea is  explosive at times."  Reported intermittent "red crystals or flecks in the stool."  Has eaten watermelon recently.  Reported there is no blood in the toilet water, or on the toilet tissue.  Reported color of toilet water as "green."  Stated the abdominal cramping can be severe at times; "sometimes it wakes me up in the morning."  Reported that it is becoming very difficult to deal with the diarrhea. Reported this AM, she had to stop x 2, on way to work, so she wouldn't have an accident.  Reported she missed her dose of Metformin on Saturday, due to family situation, and did not experience the diarrhea or cramping that day, at all.  Pt. reported she drinks 3-4, 24 oz. glasses of Crystal Light per day, or plain water.  Feels she is staying hydrated.  Appt. scheduled for 7/23 with Dr. Yong Channel.  Pt. questioned if she should hold her Metformin. Reported BS at this time is 198; ate 2 egg whites, [place] bacon, and 2 bites of watermelon this AM.   Advised I am not able to direct her to hold the Metformin at this time, but will forward note to Dr. Yong Channel.  Care advice given per protocol; verb. Understanding. Pt. Agrees with plan.           Reason for Disposition . [1] MODERATE diarrhea (e.g., 4-6 times / day more than normal) AND [2] present > 48 hours (2 days)  Answer Assessment - Initial Assessment Questions 1. DIARRHEA SEVERITY: "How bad is the diarrhea?" "How many extra stools have you had in the past 24 hours than normal?"    - NO DIARRHEA (SCALE 0)   - MILD (SCALE 1-3): Few loose or mushy BMs; increase of 1-3 stools over normal daily number of stools; mild increase in ostomy output.   -  MODERATE (SCALE 4-7): Increase of 4-6  stools daily over normal; moderate increase in ostomy output. * SEVERE (SCALE 8-10; OR 'WORST POSSIBLE'): Increase of 7 or more stools daily over normal; moderate increase in ostomy output; incontinence.     It varies; has had 5-6 stools / day on the Metformin 2. ONSET: "When did the diarrhea begin?"      About a month 3. BM CONSISTENCY: "How loose or watery is the diarrhea?"      Jello like to watery 4. VOMITING: "Are you also vomiting?" If so, ask: "How many times in the past 24 hours?"      Denied vomiting; only nausea 5. ABDOMINAL PAIN: "Are you having any abdominal pain?" If yes: "What does it feel like?" (e.g., crampy, dull, intermittent, constant)      Abdominal cramping; severe at times 6. ABDOMINAL PAIN SEVERITY: If present, ask: "How bad is the pain?"  (e.g., Scale 1-10; mild, moderate, or severe)   - MILD (1-3): doesn't interfere with normal activities, abdomen soft and not tender to touch    - MODERATE (4-7): interferes with normal activities or awakens from sleep, tender to touch    - SEVERE (8-10): excruciating pain, doubled over, unable to do any normal activities       Cramping can be severe at times; wakes her up in the morning   7. ORAL INTAKE: If vomiting, "Have you  been able to drink liquids?" "How much fluids have you had in the past 24 hours?"     Drinking well; trying to drink water with Crystal Light or plain water 8. HYDRATION: "Any signs of dehydration?" (e.g., dry mouth [not just dry lips], too weak to stand, dizziness, new weight loss) "When did you last urinate?"     Feels she is staying hydrated; drinks several 24 oz. Cups water/ day 9. EXPOSURE: "Have you traveled to a foreign country recently?" "Have you been exposed to anyone with diarrhea?" "Could you have eaten any food that was spoiled?"     Denied  10. ANTIBIOTIC USE: "Are you taking antibiotics now or have you taken antibiotics in the past 2 months?"      Denied  11. OTHER SYMPTOMS: "Do you have any other  symptoms?" (e.g., fever, blood in stool)       Intermittent red flecks in stool; denied red water or red on toilet tissue 12. PREGNANCY: "Is there any chance you are pregnant?" "When was your last menstrual period?"       Post-Menopausal  Protocols used: DIARRHEA-A-AH

## 2018-06-20 NOTE — Telephone Encounter (Signed)
Noted. Patient is scheduled for tomorrow

## 2018-06-21 ENCOUNTER — Encounter: Payer: Self-pay | Admitting: Family Medicine

## 2018-06-21 ENCOUNTER — Ambulatory Visit (INDEPENDENT_AMBULATORY_CARE_PROVIDER_SITE_OTHER): Payer: Managed Care, Other (non HMO) | Admitting: Family Medicine

## 2018-06-21 VITALS — BP 132/70 | HR 70 | Temp 98.3°F | Ht 65.0 in | Wt 269.6 lb

## 2018-06-21 DIAGNOSIS — F17200 Nicotine dependence, unspecified, uncomplicated: Secondary | ICD-10-CM | POA: Diagnosis not present

## 2018-06-21 DIAGNOSIS — E1165 Type 2 diabetes mellitus with hyperglycemia: Secondary | ICD-10-CM | POA: Diagnosis not present

## 2018-06-21 DIAGNOSIS — M7662 Achilles tendinitis, left leg: Secondary | ICD-10-CM | POA: Diagnosis not present

## 2018-06-21 LAB — POCT CBG (FASTING - GLUCOSE)-MANUAL ENTRY: GLUCOSE FASTING, POC: 104 mg/dL — AB (ref 70–99)

## 2018-06-21 NOTE — Assessment & Plan Note (Signed)
S: well controlled on metformin 1g BID prior- was changed to ER by Aldona Bar to see if GI intolerance could be improved. Seems this is causing diarrhea/abdominal discomfort though. She gets explosive diarrhea. Having to lay down, feels more fatigued. Also having some nausea.   She has skipped metformin a few times and notes full resolution of symptoms when off of this.  CBGs- 104 today in office- that has been pretty typical.   She is down 5 lbs- congratulated on effort. She would like to be on victoza but cost is prohibitive in long run Lab Results  Component Value Date   HGBA1C 6.5 04/12/2018   HGBA1C 6.3 07/30/2017   HGBA1C 7.0 (H) 04/22/2017   A/P: from avs "Take 1 week off of the metformin  Then use victoza 0.6 mg for 1 month  After 1 month go back to metformin at 750mg  extended release (so half dose). If this still causes stomach upset lets sit back down and talk about other options such as metformin 500mg  non extended release.   See you in august"  After visit went back and read HPI from visit with Inda Coke and patient diarrhea on metformin regular at that time- today she seemed to focus on the ER being her issue. I wondr if we could use ozempic with coupon card at follow up if cant tolerate metformin 750mg  ER or if a1c doesn't tolerate the lower dose

## 2018-06-21 NOTE — Progress Notes (Signed)
Subjective:  Lisa [name] is a 57 y.o. year old very pleasant female patient who presents for/with See problem oriented charting ROS- diarrhea for several weeks along with abdominal cramping and nausea   Past Medical History-  Patient Active Problem List   Diagnosis Date Noted  . Type 2 diabetes mellitus ([place]) 03/11/2017    Priority: High  . PAD (peripheral artery disease) ([place]) 01/19/2017    Priority: High  . 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 Morbid obesity ([place]) 02/14/2016    Priority: High  . Tobacco use disorder 08/12/2015    Priority: High  . COPD (chronic obstructive pulmonary disease) ([place]) 01/18/2015    Priority: High  . Hyperlipidemia 04/12/2018    Priority: Medium  . Breast mass 08/03/2017    Priority: Medium  . GERD (gastroesophageal reflux disease) 02/14/2016    Priority: Medium  . DOE (dyspnea on exertion) 03/08/2014    Priority: Medium  . Migraine without aura 07/26/2013    Priority: Medium  . Sleep apnea 02/11/2012    Priority: Medium  . Chest discomfort 10/12/2011    Priority: Medium  . Hypertension associated with diabetes ([place]) 10/12/2011    Priority: Medium  . Chronic hip pain 07/29/2017    Priority: Low  . Left knee pain 05/15/2016    Priority: Low  . Flank pain 04/21/2016    Priority: Low  . Abdominal pain 02/28/2016    Priority: Low  . Pulmonary nodule, right 04/11/2015    Priority: Low  . Mediastinal lymphadenopathy 01/18/2015    Priority: Low  . Bilateral leg pain 04/14/2018  . Essential hypertension 12/20/2017  . Pulmonary infiltrates   . S/P bronchoscopy with biopsy   . Hyperlipidemia associated with type 2 diabetes mellitus ([place]) 09/06/2017    Medications- reviewed and updated Current Outpatient Medications  Medication Sig Dispense Refill  . albuterol (PROVENTIL HFA;VENTOLIN HFA) 108 (90 Base) MCG/ACT inhaler Inhale 2 puffs into the lungs every 6 (six) hours as  needed for wheezing or shortness of breath. 1 Inhaler 3  . aspirin EC 81 MG tablet Take 81 mg by mouth daily.    Marland Kitchen atorvastatin (LIPITOR) 40 MG tablet TAKE 1 TABLET (40 MG TOTAL) BY MOUTH DAILY. 90 tablet 3  . buPROPion (WELLBUTRIN SR) 150 MG 12 hr tablet Take 150 mg by mouth in the morning  0  . carvedilol (COREG) 25 MG tablet TAKE 1 TABLET (25 MG TOTAL) BY MOUTH 2 (TWO) TIMES DAILY. 180 tablet 3  . dicyclomine (BENTYL) 10 MG capsule Take 1 capsule (10 mg total) by mouth 3 (three) times daily before meals. As needed 90 capsule 2  . doxazosin (CARDURA) 2 MG tablet TAKE 1 TABLET (2 MG TOTAL) BY MOUTH 2 (TWO) TIMES DAILY. (Patient not taking: Reported on 05/31/2018) 180 tablet 2  . losartan (COZAAR) 100 MG tablet Take 1 tablet (100 mg total) by mouth daily. 90 tablet 1  . metFORMIN (GLUCOPHAGE-XR) 750 MG 24 hr tablet Take 2 tablets (1,500 mg total) by mouth daily with breakfast. 60 tablet 1  . omeprazole (PRILOSEC) 40 MG capsule Take 1 capsule (40 mg total) by mouth daily.    . ONE TOUCH LANCETS MISC Use to check blood sugars twice a day 200 each 0  . spironolactone (ALDACTONE) 50 MG tablet TAKE 1 TABLET (50 MG TOTAL) BY MOUTH DAILY. 90 tablet 3   No current facility-administered medications for this visit.     Objective: BP 132/70 (BP  Location: Left Arm, Patient Position: Sitting, Cuff Size: Normal)   Pulse 70   Temp 98.3 F (36.8 C) (Oral)   Ht 5\' 5"  (1.651 m)   Wt 269 lb 9.6 oz (122.3 kg)   SpO2 97%   BMI 44.86 kg/m  Gen: NAD, resting comfortably CV: RRR no murmurs rubs or gallops Lungs: CTAB no crackles, wheeze, rhonchi Abdomen: soft/diffuse mild tenderness/nondistended/normal bowel sounds. Ext: no edema Skin: warm, dry Neuro: deviated left eye- prosthetic  Assessment/Plan:  Left achilles tendonitis S: left heel pain along achilles tendon for 2 months. She has gone from flats to tennis shoe helps some. Mild to moderate pain A/P: nsaids not ideal given PAD and smoking history and  dont want to place on prednisone with DM. Gave achlles tendonitis handout- 3x a week for 4 weeks as long as pain less than 1-2/10 then once a week for 2 more months advised  Type 2 diabetes mellitus ([place]) S: well controlled on metformin 1g BID prior- was changed to ER by Aldona Bar to see if GI intolerance could be improved. Seems this is causing diarrhea/abdominal discomfort though. She gets explosive diarrhea. Having to lay down, feels more fatigued. Also having some nausea.   She has skipped metformin a few times and notes full resolution of symptoms when off of this.  CBGs- 104 today in office- that has been pretty typical.   She is down 5 lbs- congratulated on effort. She would like to be on victoza but cost is prohibitive in long run Lab Results  Component Value Date   HGBA1C 6.5 04/12/2018   HGBA1C 6.3 07/30/2017   HGBA1C 7.0 (H) 04/22/2017   A/P: from avs "Take 1 week off of the metformin  Then use victoza 0.6 mg for 1 month  After 1 month go back to metformin at 750mg  extended release (so half dose). If this still causes stomach upset lets sit back down and talk about other options such as metformin 500mg  non extended release.   See you in august"  After visit went back and read HPI from visit with Inda Coke and patient diarrhea on metformin regular at that time- today she seemed to focus on the ER being her issue. I wondr if we could use ozempic with coupon card at follow up if cant tolerate metformin 750mg  ER or if a1c doesn't tolerate the lower dose  Tobacco use disorder S:  3/4 PPD.  A/P: encouraged full cessation- she is not ready to quit  Likely POC a1c next visit Future Appointments  Date Time Provider [place]  07/15/2018  3:30 PM Marin Olp, MD [facility]  07/28/2018  8:45 AM Ladene Artist, MD [facility]  08/18/2018 12:00 PM [facility] - [facility]  08/18/2018  1:00 PM [facility] 3 - EM [facility]  08/18/2018  1:15 PM  Nickel, Sharmon Leyden, NP [facility]  05/30/2019  3:30 PM Huel Cote, NP [facility]    Lab/Order associations: Type 2 diabetes mellitus with hyperglycemia, without long-term current use of insulin ([place]) - Plan: POCT CBG (Fasting - Glucose)  Tobacco use disorder  Return precautions advised.  Garret Reddish, MD

## 2018-06-21 NOTE — Patient Instructions (Addendum)
Take 1 week off of the metformin  Then use victoza 0.6 mg for 1 month  After 1 month go back to metformin at 750mg  extended release (so half dose). If this still causes stomach upset lets sit back down and talk about other options such as metformin 500mg  non extended release.   See you in august

## 2018-06-21 NOTE — Assessment & Plan Note (Signed)
S:  3/4 PPD.  A/P: encouraged full cessation- she is not ready to quit

## 2018-06-22 ENCOUNTER — Other Ambulatory Visit: Payer: Self-pay | Admitting: Physician Assistant

## 2018-07-15 ENCOUNTER — Emergency Department (HOSPITAL_COMMUNITY)
Admission: EM | Admit: 2018-07-15 | Discharge: 2018-07-15 | Disposition: A | Discharge status: Home / Self Care | Payer: Managed Care, Other (non HMO) | Attending: Emergency Medicine | Admitting: Emergency Medicine

## 2018-07-15 ENCOUNTER — Encounter: Payer: Self-pay | Admitting: Family Medicine

## 2018-07-15 ENCOUNTER — Encounter (HOSPITAL_COMMUNITY): Payer: Self-pay

## 2018-07-15 ENCOUNTER — Ambulatory Visit (INDEPENDENT_AMBULATORY_CARE_PROVIDER_SITE_OTHER): Payer: Managed Care, Other (non HMO) | Admitting: Family Medicine

## 2018-07-15 ENCOUNTER — Emergency Department (HOSPITAL_COMMUNITY): Payer: Managed Care, Other (non HMO)

## 2018-07-15 VITALS — BP 114/90 | HR 89 | Temp 98.4°F | Ht 65.0 in | Wt 270.8 lb

## 2018-07-15 DIAGNOSIS — R079 Chest pain, unspecified: Secondary | ICD-10-CM

## 2018-07-15 DIAGNOSIS — E119 Type 2 diabetes mellitus without complications: Secondary | ICD-10-CM | POA: Insufficient documentation

## 2018-07-15 DIAGNOSIS — J45909 Unspecified asthma, uncomplicated: Secondary | ICD-10-CM | POA: Diagnosis not present

## 2018-07-15 DIAGNOSIS — E1165 Type 2 diabetes mellitus with hyperglycemia: Secondary | ICD-10-CM | POA: Diagnosis not present

## 2018-07-15 DIAGNOSIS — R072 Precordial pain: Secondary | ICD-10-CM | POA: Insufficient documentation

## 2018-07-15 DIAGNOSIS — I10 Essential (primary) hypertension: Secondary | ICD-10-CM | POA: Diagnosis not present

## 2018-07-15 DIAGNOSIS — F17210 Nicotine dependence, cigarettes, uncomplicated: Secondary | ICD-10-CM | POA: Insufficient documentation

## 2018-07-15 LAB — BASIC METABOLIC PANEL
Anion gap: 10 (ref 5–15)
BUN: 16 mg/dL (ref 6–20)
CO2: 23 mmol/L (ref 22–32)
Calcium: 9.6 mg/dL (ref 8.9–10.3)
Chloride: 107 mmol/L (ref 98–111)
Creatinine, Ser: 1.06 mg/dL — ABNORMAL HIGH (ref 0.44–1.00)
GFR calc Af Amer: >60 mL/min (ref >60)
GFR, EST NON AFRICAN AMERICAN: 58 mL/min — AB (ref >60)
Glucose, Bld: 96 mg/dL (ref 70–99)
POTASSIUM: 3.7 mmol/L (ref 3.5–5.1)
SODIUM: 140 mmol/L (ref 135–145)

## 2018-07-15 LAB — I-STAT TROPONIN, ED: TROPONIN I, POC: 0.01 ng/mL (ref 0.00–0.08)

## 2018-07-15 LAB — HEPATIC FUNCTION PANEL
ALT: 19 U/L (ref 0–44)
AST: 18 U/L (ref 15–41)
Albumin: 3.5 g/dL (ref 3.5–5.0)
Alkaline Phosphatase: 82 U/L (ref 38–126)
BILIRUBIN INDIRECT: 0.5 mg/dL (ref 0.3–0.9)
Bilirubin, Direct: 0.1 mg/dL (ref 0.0–0.2)
TOTAL PROTEIN: 7.2 g/dL (ref 6.5–8.1)
Total Bilirubin: 0.6 mg/dL (ref 0.3–1.2)

## 2018-07-15 LAB — LIPASE, BLOOD: Lipase: 35 U/L (ref 11–51)

## 2018-07-15 LAB — CBC
HEMATOCRIT: 45.6 % (ref 36.0–46.0)
HEMOGLOBIN: 14.9 g/dL (ref 12.0–15.0)
MCH: 30 pg (ref 26.0–34.0)
MCHC: 32.7 g/dL (ref 30.0–36.0)
MCV: 91.9 fL (ref 78.0–100.0)
Platelets: 246 10*3/uL (ref 150–400)
RBC: 4.96 MIL/uL (ref 3.87–5.11)
RDW: 13.7 % (ref 11.5–15.5)
WBC: 8.2 10*3/uL (ref 4.0–10.5)

## 2018-07-15 MED ORDER — PANTOPRAZOLE SODIUM 20 MG PO TBEC: 20.0000 mg | DELAYED_RELEASE_TABLET | Freq: Every day | ORAL | 0 refills | Status: DC

## 2018-07-15 MED ORDER — TRAMADOL HCL 50 MG PO TABS: 50.0000 mg | ORAL_TABLET | Freq: Once | ORAL | Status: DC

## 2018-07-15 MED ORDER — SUCRALFATE 1 GM/10ML PO SUSP: 1.0000 g | Freq: Three times a day (TID) | ORAL | 0 refills | Status: DC

## 2018-07-15 MED ORDER — GI COCKTAIL ~~LOC~~
30.0000 mL | Freq: Once | ORAL | Status: AC
  Administered 2018-07-15: 30 mL via ORAL
  Filled 2018-07-15: qty 30

## 2018-07-15 MED ORDER — TRAMADOL HCL 50 MG PO TABS: 50.0000 mg | ORAL_TABLET | Freq: Four times a day (QID) | ORAL | 0 refills | Status: DC | PRN

## 2018-07-15 MED ORDER — SEMAGLUTIDE(0.25 OR 0.5MG/DOS) 2 MG/1.5ML ~~LOC~~ SOPN: 0.5000 mg | PEN_INJECTOR | SUBCUTANEOUS | 5 refills | Status: DC

## 2018-07-15 NOTE — Discharge Instructions (Signed)

## 2018-07-15 NOTE — Assessment & Plan Note (Signed)
S: suspect controlled on victoza for last month with metformin 1 BID then ER to see if GI intolerance would improve. She is still on the victoza though went up to sounds like 0.9 or 1.2mg . She has tolerated this well from GI perspective- much better than metformin Lab Results  Component Value Date   HGBA1C 6.5 04/12/2018   HGBA1C 6.3 07/30/2017   HGBA1C 7.0 (H) 04/22/2017    A/P: had planned on a1c today but patient needs eval for chest pain and sent to ER. We will send in ozempic for her as there is a good coupon/discount for it for private pay and she prefers once weekly. She will finish victoza first. She much prefers to remain off metformin given GI issues even on extended release

## 2018-07-15 NOTE — ED Triage Notes (Signed)
[facility] reports pt was a PCP for follow up and mentioned having right sided chest pain. Pt has hx of GERD, but is hi risk so they wanted her to get a cardiac workup. [facility] reports pt has bilat swelling in the last week. She also restarted Vicatoza x1 ago. She takes 81mg  ASA daily and refused to take more or NTG for EMS.  VS 168/82 hr 70 99% RA cbg 116

## 2018-07-15 NOTE — ED Notes (Signed)
Patient transported to X-ray 

## 2018-07-15 NOTE — ED Notes (Signed)
Patient ambulatory to bathroom with steady gait at this time 

## 2018-07-15 NOTE — ED Notes (Signed)
Pt has agreed to have chest xray. Rad notifed

## 2018-07-15 NOTE — ED Notes (Signed)
Pt returned from xray

## 2018-07-15 NOTE — Progress Notes (Signed)
Subjective:  Lisa [name] is a 57 y.o. year old very pleasant female patient who presents for/with See problem oriented charting ROS- complains of chest pain both central and right sided, belching. Some shortness of breath today. Symptoms worse with meals. Also has had dark black stool per her description.    Past Medical History-  Patient Active Problem List   Diagnosis Date Noted  . Type 2 diabetes mellitus ([place]) 03/11/2017    Priority: High  . PAD (peripheral artery disease) ([place]) 01/19/2017    Priority: High  . 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 Morbid obesity ([place]) 02/14/2016    Priority: High  . Tobacco use disorder 08/12/2015    Priority: High  . COPD (chronic obstructive pulmonary disease) ([place]) 01/18/2015    Priority: High  . Hyperlipidemia 04/12/2018    Priority: Medium  . Breast mass 08/03/2017    Priority: Medium  . GERD (gastroesophageal reflux disease) 02/14/2016    Priority: Medium  . DOE (dyspnea on exertion) 03/08/2014    Priority: Medium  . Migraine without aura 07/26/2013    Priority: Medium  . Sleep apnea 02/11/2012    Priority: Medium  . Chest discomfort 10/12/2011    Priority: Medium  . Hypertension associated with diabetes ([place]) 10/12/2011    Priority: Medium  . Chronic hip pain 07/29/2017    Priority: Low  . Left knee pain 05/15/2016    Priority: Low  . Flank pain 04/21/2016    Priority: Low  . Abdominal pain 02/28/2016    Priority: Low  . Pulmonary nodule, right 04/11/2015    Priority: Low  . Mediastinal lymphadenopathy 01/18/2015    Priority: Low  . Bilateral leg pain 04/14/2018  . Essential hypertension 12/20/2017  . Pulmonary infiltrates   . S/P bronchoscopy with biopsy   . Hyperlipidemia associated with type 2 diabetes mellitus ([place]) 09/06/2017    Medications- reviewed and updated Current Outpatient Medications  Medication Sig Dispense Refill  . albuterol  (PROVENTIL HFA;VENTOLIN HFA) 108 (90 Base) MCG/ACT inhaler Inhale 2 puffs into the lungs every 6 (six) hours as needed for wheezing or shortness of breath. 1 Inhaler 3  . aspirin EC 81 MG tablet Take 81 mg by mouth daily.    Marland Kitchen atorvastatin (LIPITOR) 40 MG tablet TAKE 1 TABLET (40 MG TOTAL) BY MOUTH DAILY. 90 tablet 3  . buPROPion (WELLBUTRIN SR) 150 MG 12 hr tablet Take 150 mg by mouth in the morning  0  . carvedilol (COREG) 25 MG tablet TAKE 1 TABLET (25 MG TOTAL) BY MOUTH 2 (TWO) TIMES DAILY. 180 tablet 3  . dicyclomine (BENTYL) 10 MG capsule Take 1 capsule (10 mg total) by mouth 3 (three) times daily before meals. As needed 90 capsule 2  . doxazosin (CARDURA) 2 MG tablet TAKE 1 TABLET (2 MG TOTAL) BY MOUTH 2 (TWO) TIMES DAILY. 180 tablet 2  . liraglutide (VICTOZA) 18 MG/3ML SOPN Inject 0.6 mg into the skin daily.    Marland Kitchen losartan (COZAAR) 100 MG tablet Take 1 tablet (100 mg total) by mouth daily. 90 tablet 1  . omeprazole (PRILOSEC) 40 MG capsule Take 1 capsule (40 mg total) by mouth daily.    . ONE TOUCH LANCETS MISC Use to check blood sugars twice a day 200 each 0  . spironolactone (ALDACTONE) 50 MG tablet TAKE 1 TABLET (50 MG TOTAL) BY MOUTH DAILY. 90 tablet 3   Objective: BP 114/90 (BP Location: Left Arm, Patient Position:  Sitting, Cuff Size: Large) Comment: Pt upset and crying  Pulse 89   Temp 98.4 F (36.9 C) (Oral)   Ht 5\' 5"  (1.651 m)   Wt 270 lb 12.8 oz (122.8 kg)   SpO2 97%   BMI 45.06 kg/m  Gen: NAD, resting comfortably CV: RRR no murmurs rubs or gallops Lungs: CTAB no crackles, wheeze, rhonchi Abdomen: soft/nontender/nondistended  Ext: no edema Skin: warm, dry  EKG:  NSR with rate 80, normal rhythm, normal axis, normal intervals, no hypertrophy, no st or t wave changes (though there was significant movement during exam- patient not comfortable laying down)  Assessment/Plan:  Chest pain S:  stopped omeprazole 40mg  a week ago, has been on OTC omeprazole 20mg . Over last 2  days developed pain in center of her chest. Has central chest pain 4/10 at present.  Mildly short of breath today. Worse since lunch. Feels that meals make it worse. nto sure if worse with exertion but hasnt been exerting herself much. Worse with anxiety. Grabs at chest at multiple times during visit. States has intermittent pain on right side of chest but has had a persistent central chest pain  Has also had dark black stool a few times within a few days). Having bowel movement every 2-3 days and then large volume stool- last one being dark black.  A/P: I strongly suspect this is gastric reflux related. We discussed would strongly prefer to get a stat troponin and cbc (given potential melena and associated anemia if this is the case) but unfortunately the soonest we could get this as a stat troponin would likely be near midnight or tomorrow (has to be frozen and sent out). In light of this we discussed ER trip, patients pain worsened and she grabbed at her chest multiple more times. We had planned to have husband transport her but she complained of worsening pain (very likely anxiety related) so we opted to change to EMS transport.   Simply have to rule out MI given extensive risk factors including PAD, DM, HTN, HLD, smoker.   Type 2 diabetes mellitus ([facility]) S: suspect controlled on victoza for last month with metformin 1 BID then ER to see if GI intolerance would improve. She is still on the victoza though went up to sounds like 0.9 or 1.2mg . She has tolerated this well from GI perspective- much better than metformin Lab Results  Component Value Date   HGBA1C 6.5 04/12/2018   HGBA1C 6.3 07/30/2017   HGBA1C 7.0 (H) 04/22/2017    A/P: had planned on a1c today but patient needs eval for chest pain and sent to ER. We will send in ozempic for her as there is a good coupon/discount for it for private pay and she prefers once weekly. She will finish victoza first. She much prefers to remain off metformin  given GI issues even on extended release   Future Appointments  Date Time Provider [place]  07/28/2018  8:45 AM Ladene Artist, MD [facility]  08/18/2018 12:00 PM [facility] 3 - EM [facility]  08/18/2018  1:00 PM [facility] 3 - EM [facility]  08/18/2018  1:15 PM Nickel, Sharmon Leyden, NP [facility]  05/30/2019  3:30 PM Huel Cote, NP [facility]   Lab/Order associations: Chest pain, unspecified type - Plan: EKG 12-Lead  Type 2 diabetes mellitus with hyperglycemia, without long-term current use of insulin ([place])  Meds ordered this encounter  Medications  . Semaglutide (OZEMPIC) 0.25 or 0.5 MG/DOSE  SOPN    Sig: Inject 0.5 mg into the skin once a week.    Dispense:  2 pen    Refill:  5    Return precautions advised.  Garret Reddish, MD

## 2018-07-15 NOTE — Patient Instructions (Addendum)
Health Maintenance Due  Topic Date Due  . INFLUENZA VACCINE -Schedule for this Fall 06/30/2018      

## 2018-07-15 NOTE — ED Notes (Signed)
Pt alert and oriented in NAD. Pt verbalized understanding of discharge instructions. 

## 2018-07-15 NOTE — ED Provider Notes (Signed)
Emergency Department Provider Note   I have reviewed the triage vital signs and the nursing notes.   HISTORY  Chief Complaint Chest Pain   HPI Lisa [name] is a 57 y.o. female with PMH of non-cardiac chest pain, HTN, DM, and OSA's to the emergency department for evaluation of central chest pain which is been intermittent over the past week.  The patient states that she has a tightness in the center of her chest radiating to the right side.  No exertional or pleuritic component to pain.  The pain is more severe when she moves the wrong way or coughs.  She denies any productive cough or fevers.  Went to her primary care physician today who referred her to the emergency department for cardiac work-up.  She takes an 81 mg aspirin daily and was given nitroglycerin prior to arrival with no improvement in her pain symptoms.  She is also having some epigastric abdominal discomfort. Denies any diaphoresis.   Past Medical History:  Diagnosis Date  . Allergic rhinitis   . Asthma   . Breast nodule    right breast, to see dr Helane Rima 06-20-2012 for   . Chest pain, non-cardiac   . Chronic headaches   . Cigarette smoker   . Complication of anesthesia 05-17-2012   trouble breathing after colonscopy, needed nebulizer  . Diverticulosis   . GERD (gastroesophageal reflux disease)   . Hepatic hemangioma   . Hypertension   . IBS (irritable bowel syndrome)   . Obesity   . Prosthetic eye globe   . Pyloric erosion   . Sleep apnea    CPAP setting varies from 4-10  . Type 2 diabetes mellitus ([place]) 03/11/2017    Patient Active Problem List   Diagnosis Date Noted  . Bilateral leg pain 04/14/2018  . Hyperlipidemia 04/12/2018  . Essential hypertension 12/20/2017  . Pulmonary infiltrates   . S/P bronchoscopy with biopsy   . Hyperlipidemia associated with type 2 diabetes mellitus ([place]) 09/06/2017  . Breast mass 08/03/2017  . Chronic hip pain 07/29/2017  . Type 2 diabetes mellitus ([place]) 03/11/2017    . PAD (peripheral artery disease) ([place]) 01/19/2017  . 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 Left knee pain 05/15/2016  . Flank pain 04/21/2016  . Abdominal pain 02/28/2016  . GERD (gastroesophageal reflux disease) 02/14/2016  . Morbid obesity ([place]) 02/14/2016  . Tobacco use disorder 08/12/2015  . Pulmonary nodule, right 04/11/2015  . COPD (chronic obstructive pulmonary disease) ([place]) 01/18/2015  . Mediastinal lymphadenopathy 01/18/2015  . DOE (dyspnea on exertion) 03/08/2014  . Migraine without aura 07/26/2013  . Sleep apnea 02/11/2012  . Chest discomfort 10/12/2011  . Hypertension associated with diabetes ([place]) 10/12/2011    Past Surgical History:  Procedure Laterality Date  . ABDOMINAL AORTOGRAM W/LOWER EXTREMITY N/A 04/30/2017   Procedure: Abdominal Aortogram w/Lower Extremity;  Surgeon: Elam Dutch, MD;  Location: [facility];  Service: Cardiovascular;  Laterality: N/A;  . CARDIAC CATHETERIZATION  12/28/2007   EF 75%. IT REVEALS NORMAL/SUPRANORMAL LEFT VENTRICULAR SYSTOLIC FUNCTION  . CARDIOVASCULAR STRESS TEST  03/25/2007   EF 78%  . CESAREAN SECTION  2003, 2009   x 2  . CHOLECYSTECTOMY  1993  . COLONOSCOPY  2014  . ENDOMETRIAL ABLATION  09/2010   and D&C  . HERNIA REPAIR  7209   umbilical  . LAPAROSCOPY    . PERIPHERAL VASCULAR BALLOON ANGIOPLASTY Left 04/30/2017   Procedure: Peripheral Vascular Balloon Angioplasty;  Surgeon:  Elam Dutch, MD;  Location: [facility];  Service: Cardiovascular;  Laterality: Left;  SFA  . TUBAL LIGATION    . UMBILICAL HERNIA REPAIR  2004  . US ECHOCARDIOGRAPHY  05/08/2008   EF 55-60%  . VENTRAL HERNIA REPAIR  06/15/2012   Procedure: LAPAROSCOPIC VENTRAL HERNIA;  Surgeon: Edward Jolly, MD;  Location: [facility];  Service: General;  Laterality: N/A;  LAPAROSCOPIC REPAIR VENTRAL HERNIA  . VIDEO BRONCHOSCOPY Bilateral 11/15/2017   Procedure: VIDEO BRONCHOSCOPY WITH FLUORO;   Surgeon: Collene Gobble, MD;  Location: [facility];  Service: Cardiopulmonary;  Laterality: Bilateral;    Allergies Almond oil; Apple; Ceftin [cefuroxime]; Cherry extract; Other; Guaifenesin & derivatives; Hctz [hydrochlorothiazide]; Morphine and related; Naproxen; and Lisinopril  Family History  Problem Relation Age of Onset  . Hypertension Mother   . Lung cancer Mother        92  . Diabetes Maternal Grandmother   . Heart disease Father        pacemaker or defibrillator  . Heart attack Paternal Uncle   . Breast cancer Unknown   . Colon cancer Neg Hx     Social History Social History   Tobacco Use  . Smoking status: Current Every Day Smoker    Years: 25.00    Types: Cigarettes  . Smokeless tobacco: Never Used  . Tobacco comment: 10 cigarettes per day  Substance Use Topics  . Alcohol use: No    Alcohol/week: 0.0 standard drinks  . Drug use: No    Review of Systems  Constitutional: No fever/chills Eyes: No visual changes. ENT: No sore throat. Cardiovascular: Positive chest pain. Respiratory: Denies shortness of breath. Gastrointestinal: Positive epigastric abdominal pain.  No nausea, no vomiting.  No diarrhea.  No constipation. Genitourinary: Negative for dysuria. Musculoskeletal: Negative for back pain. Skin: Negative for rash. Neurological: Negative for headaches, focal weakness or numbness.  10-point ROS otherwise negative.  ____________________________________________   PHYSICAL EXAM:  VITAL SIGNS: ED Triage Vitals  Enc Vitals Group     BP 07/15/18 1728 (!) 141/68     Pulse Rate 07/15/18 1728 73     Resp 07/15/18 1728 11     Temp 07/15/18 1728 98.5 F (36.9 C)     Temp Source 07/15/18 1728 Oral     SpO2 07/15/18 1728 98 %     Weight 07/15/18 1729 270 lb 8 oz (122.7 kg)     Height 07/15/18 1729 5\' 5"  (1.651 m)     Pain Score 07/15/18 1734 4   Constitutional: Alert and oriented. Well appearing and in no acute distress. Eyes: Conjunctivae are  normal. Head: Atraumatic. Nose: No congestion/rhinnorhea. Mouth/Throat: Mucous membranes are moist.  Neck: No stridor.  Cardiovascular: Normal rate, regular rhythm. Good peripheral circulation. Grossly normal heart sounds.   Respiratory: Normal respiratory effort.  No retractions. Lungs CTAB. Gastrointestinal: Soft with mild epigastric tenderness. No distention.  Musculoskeletal: No lower extremity tenderness nor edema. No gross deformities of extremities. Tenderness to palpation over the sternum which reproduces the patient's pain.  Neurologic:  Normal speech and language. No gross focal neurologic deficits are appreciated.  Skin:  Skin is warm, dry and intact. No rash noted.  ____________________________________________   LABS (all labs ordered are listed, but only abnormal results are displayed)  Labs Reviewed  BASIC METABOLIC PANEL - Abnormal; Notable for the following components:      Result Value   Creatinine, Ser 1.06 (*)    GFR calc non Af Amer 58 (*)  All other components within normal limits  CBC  HEPATIC FUNCTION PANEL  LIPASE, BLOOD  I-STAT TROPONIN, ED   ____________________________________________  EKG   EKG Interpretation  Date/Time:  Friday July 15 2018 17:25:44 EDT Ventricular Rate:  71 PR Interval:    QRS Duration: 93 QT Interval:  376 QTC Calculation: 409 R Axis:   8 Text Interpretation:  Sinus rhythm Prolonged PR interval Probable anteroseptal infarct, old No STEMI.  Confirmed by Nanda Quinton [phone]) on 07/15/2018 6:34:03 PM       ____________________________________________  RADIOLOGY  Dg Chest 2 View  Result Date: 07/15/2018 CLINICAL DATA:  Chest pain EXAM: CHEST - 2 VIEW COMPARISON:  November 15, 2017 FINDINGS: Lungs are clear. Heart size is upper normal with pulmonary vascularity normal. No adenopathy. No bone lesions. No pneumothorax. IMPRESSION: No edema or consolidation.  Heart upper normal in size. Electronically Signed   By: Lowella Grip III M.D.   On: 07/15/2018 19:39    ____________________________________________   PROCEDURES  Procedure(s) performed:   Procedures  None ____________________________________________   INITIAL IMPRESSION / ASSESSMENT AND PLAN / ED COURSE  Pertinent labs & imaging results that were available during my care of the patient were reviewed by me and considered in my medical decision making (see chart for details).  Patient presents to the emergency department for evaluation of chest pain.  Her chest pain symptoms are atypical.  I am able to reproduce the pain with palpation over the sternum.  Plan for chest x-ray, labs including troponin. Added on LFTs and lipase  Labs and imaging normal. Suspect gastritis causing epigastric pain with chest pain appearing to be MSK in origin. Will treat pain symptoms. Provided contact information for Cardiology to discuss evaluating further with stress testing.   At this time, I do not feel there is any life-threatening condition present. I have reviewed and discussed all results (EKG, imaging, lab, urine as appropriate), exam findings with patient. I have reviewed nursing notes and appropriate previous records.  I feel the patient is safe to be discharged home without further emergent workup. Discussed usual and customary return precautions. Patient and family (if present) verbalize understanding and are comfortable with this plan.  Patient will follow-up with their primary care provider. If they do not have a primary care provider, information for follow-up has been provided to them. All questions have been answered.  ____________________________________________  FINAL CLINICAL IMPRESSION(S) / ED DIAGNOSES  Final diagnoses:  Precordial chest pain     MEDICATIONS GIVEN DURING THIS VISIT:  Medications  gi cocktail (Maalox,Lidocaine,Donnatal) (30 mLs Oral Given 07/15/18 1906)     NEW OUTPATIENT MEDICATIONS STARTED DURING THIS  VISIT:  Discharge Medication List as of 07/15/2018  8:45 PM    START taking these medications   Details  pantoprazole (PROTONIX) 20 MG tablet Take 1 tablet (20 mg total) by mouth daily., Starting Fri 07/15/2018, Until Sun 08/14/2018, Print    sucralfate (CARAFATE) 1 GM/10ML suspension Take 10 mLs (1 g total) by mouth 4 (four) times daily -  with meals and at bedtime., Starting Fri 07/15/2018, Print    traMADol (ULTRAM) 50 MG tablet Take 1 tablet (50 mg total) by mouth every 6 (six) hours as needed., Starting Fri 07/15/2018, Print        Note:  This document was prepared using Dragon voice recognition software and may include unintentional dictation errors.  Nanda Quinton, MD Emergency Medicine    Roni Scow, [facility], MD 07/16/18 [id number]

## 2018-07-25 ENCOUNTER — Ambulatory Visit: Payer: Self-pay | Admitting: *Deleted

## 2018-07-25 MED ORDER — LIRAGLUTIDE 18 MG/3ML ~~LOC~~ SOPN: 0.6000 mg | PEN_INJECTOR | Freq: Every day | SUBCUTANEOUS | 2 refills | Status: DC

## 2018-07-25 NOTE — Telephone Encounter (Signed)
Discussed pt with Dr. Yong Channel and he said okay to send Rx for Victoza to pharmacy for pt. Pt to monitor sugars if continues to be 300 or above need to make an appt.

## 2018-07-25 NOTE — Telephone Encounter (Signed)
Pt was passed through to office. I spoke to pt told her she was told to go to ED. Pt said she is not going back to the ED she just needs her Victoza since her pen is jammed and she has not had her shot in 3 days. Told pt is says you are having trouble staying awake and sugar is elevated need to go to ED. Pt refuses said she just wants her Victoza and if she can't get any she will just start her Metformin that makes her sick. Told pt I can send Rx for Victoza to pharmacy and I will discuss with Dr. Yong Channel what he wants you to do and get back to you. Pt verbalized understanding/

## 2018-07-25 NOTE — Telephone Encounter (Signed)
Pt called because her blood sugar was 297 at 1120; she says that her victoza pen jammed and she could not get her dose; she says that she does not have another pen; her last dose was Friday 07/22/18; the pt says that she is also having a hard time staying awake; conference call initiated with [name] at [facility], and she says per Dr Ansel Bong nurse, [name], a victoza pen can be called into the pt's pharmacy, and she should go to the ED; the pt refuses this disposition; Lisa [name] states that she will discuss this with Dr Yong Channel and she will call the pt back.   Reason for Disposition . [1] Caller has URGENT medication or insulin pump question AND [2] triager unable to answer question  Answer Assessment - Initial Assessment Questions 1. BLOOD GLUCOSE: "What is your blood glucose level?"      297 2. ONSET: "When did you check the blood glucose?"     07/23/18 1120 3. USUAL RANGE: "What is your glucose level usually?" (e.g., usual fasting morning value, usual evening value)     109-125 since she has been on victoza 4. KETONES: "Do you check for ketones (urine or blood test strips)?" If yes, ask: "What does the test show now?"      no 5. TYPE 1 or 2:  "Do you know what type of diabetes you have?"  (e.g., Type 1, Type 2, Gestational; doesn't know)      Type 2 6. INSULIN: "Do you take insulin?" "What type of insulin(s) do you use? What is the mode of delivery? (syringe, pen (e.g., injection or  pump)?"      no 7. DIABETES PILLS: "Do you take any pills for your diabetes?" If yes, ask: "Have you missed taking any pills recently?"     no 8. OTHER SYMPTOMS: "Do you have any symptoms?" (e.g., fever, frequent urination, difficulty breathing, dizziness, weakness, vomiting)     sleepy 9. PREGNANCY: "Is there any chance you are pregnant?" "When was your last menstrual period?"     No  Protocols used: DIABETES - HIGH BLOOD SUGAR-A-AH

## 2018-07-25 NOTE — Telephone Encounter (Signed)
Called pt back told her discussed elevated sugar with Dr. Yong Channel and your symptoms. He said okay to send Rx for Victoza, continue to monitor sugars if 300 or higher consistently need to make an appt with Dr. Yong Channel. Rx sent to pharmacy. Pt verbalized understanding.

## 2018-07-28 ENCOUNTER — Encounter: Payer: Self-pay | Admitting: Gastroenterology

## 2018-07-28 ENCOUNTER — Ambulatory Visit (INDEPENDENT_AMBULATORY_CARE_PROVIDER_SITE_OTHER): Payer: Managed Care, Other (non HMO) | Admitting: Gastroenterology

## 2018-07-28 VITALS — BP 124/80 | HR 84 | Ht 65.75 in | Wt 266.2 lb

## 2018-07-28 DIAGNOSIS — R140 Abdominal distension (gaseous): Secondary | ICD-10-CM

## 2018-07-28 DIAGNOSIS — K219 Gastro-esophageal reflux disease without esophagitis: Secondary | ICD-10-CM | POA: Diagnosis not present

## 2018-07-28 DIAGNOSIS — R079 Chest pain, unspecified: Secondary | ICD-10-CM | POA: Diagnosis not present

## 2018-07-28 MED ORDER — PANTOPRAZOLE SODIUM 40 MG PO TBEC: 40.0000 mg | DELAYED_RELEASE_TABLET | Freq: Every day | ORAL | 11 refills | Status: DC

## 2018-07-28 MED ORDER — DICYCLOMINE HCL 10 MG PO CAPS: 10.0000 mg | ORAL_CAPSULE | Freq: Three times a day (TID) | ORAL | 11 refills | Status: DC

## 2018-07-28 NOTE — Patient Instructions (Signed)
Increase your pantoprazole 20 mg to take 2 tablets (40 mg) daily until you run out of your prescription. Then pick up the new prescription for pantoprazole 40 mg daily at the pharmacy.   We have also sent in a prescription for dicyclomine 10 mg.   You can take over the counter Gas-X four times a day as needed for gas and bloating.   Patient advised to avoid spicy, acidic, citrus, chocolate, mints, fruit and fruit juices.  Limit the intake of caffeine, alcohol and Soda.  Don't exercise too soon after eating.  Don't lie down within 3-4 hours of eating.  Elevate the head of your bed.  Normal BMI (Body Mass Index- based on height and weight) is between 19 and 25. Your BMI today is Body mass index is 43.3 kg/m. Marland Kitchen Please consider follow up  regarding your BMI with your Primary Care Provider.  Thank you for choosing me and [facility].  Pricilla Riffle. Dagoberto Ligas., MD., Marval Regal

## 2018-07-28 NOTE — Progress Notes (Signed)
    History of Present Illness: This is a 57 year old female complaining of upper abdominal pain, abdominal bloating, heartburn, chest pain, nausea, belching, diarrhea and dark stools.  She was evaluated in the ED on August 16 for chest pain.  Her chest pain was reproduced with palpation of her sternum.  Her dark stools were not black or tarry and were only present for a couple days and then went away spontaneously.  Metformin was stopped and her diarrhea resolved.  She was restarted on pantoprazole 20 mg daily and her reflux symptoms are under better control.  EGD performed in December 2015 showed mild nonerosive gastritis.  Recent CBC, lipase, LFTs were normal.  Colonoscopy performed in June 2013 showed moderate diverticulosis, hemorrhoids and hyperplastic polyps.  Current Medications, Allergies, Past Medical History, Past Surgical History, Family History and Social History were reviewed in [employer] record.  Physical Exam: General: Well developed, well nourished, obese, no acute distress Head: Normocephalic and atraumatic Eyes:  sclerae anicteric, EOMI Ears: Normal auditory acuity Mouth: No deformity or lesions Lungs: Clear throughout to auscultation Heart: Regular rate and rhythm; no murmurs, rubs or bruits Abdomen: Soft, non tender and non distended. No masses, hepatosplenomegaly or hernias noted. Normal Bowel sounds Rectal: Not done Musculoskeletal: Symmetrical with no gross deformities  Pulses:  Normal pulses noted Extremities: No clubbing, cyanosis, edema or deformities noted Neurological: Alert oriented x 4, grossly nonfocal Psychological:  Alert and cooperative. Normal mood and affect  Assessment and Recommendations:  1.  GERD, belching, upper abdominal pain, abdominal bloating.  Increase pantoprazole to 40 mg daily.  Resume dicyclomine 10 mg 3 times daily as needed for abdominal pain, abdominal bloating.  Begin Gas-X 4 times daily as needed.  Closely follow  antireflux measures.  If symptoms are not adequately controlled after 1 month consider EGD for further evaluation.  REV in 6 weeks.   2.  Chest pain, recent ED evaluation was consistent with a musculoskeletal etiology.    3.  Diarrhea, resolved, secondary to metformin.  4.  CRC screening, average risk. A 10-year interval screening colonoscopy is recommended in June 2023.

## 2018-08-05 ENCOUNTER — Telehealth: Payer: Self-pay | Admitting: Gastroenterology

## 2018-08-05 MED ORDER — DICYCLOMINE HCL 10 MG PO CAPS: 10.0000 mg | ORAL_CAPSULE | Freq: Three times a day (TID) | ORAL | 11 refills | Status: DC

## 2018-08-05 NOTE — Telephone Encounter (Signed)
Prescription sent to patient's pharmacy.

## 2018-08-18 ENCOUNTER — Ambulatory Visit (INDEPENDENT_AMBULATORY_CARE_PROVIDER_SITE_OTHER): Payer: Managed Care, Other (non HMO) | Admitting: Family

## 2018-08-18 ENCOUNTER — Ambulatory Visit (HOSPITAL_COMMUNITY)
Admission: RE | Admit: 2018-08-18 | Discharge: 2018-08-18 | Disposition: A | Discharge status: Home / Self Care | Payer: Managed Care, Other (non HMO) | Source: Ambulatory Visit | Attending: Vascular Surgery | Admitting: Vascular Surgery

## 2018-08-18 ENCOUNTER — Encounter: Payer: Self-pay | Admitting: Family

## 2018-08-18 ENCOUNTER — Other Ambulatory Visit: Payer: Self-pay

## 2018-08-18 ENCOUNTER — Ambulatory Visit (INDEPENDENT_AMBULATORY_CARE_PROVIDER_SITE_OTHER)
Admission: RE | Admit: 2018-08-18 | Discharge: 2018-08-18 | Disposition: A | Discharge status: Home / Self Care | Payer: Managed Care, Other (non HMO) | Source: Ambulatory Visit | Attending: Vascular Surgery | Admitting: Vascular Surgery

## 2018-08-18 VITALS — BP 132/85 | HR 72 | Temp 97.6°F | Resp 18 | Ht 65.75 in | Wt 263.0 lb

## 2018-08-18 DIAGNOSIS — I739 Peripheral vascular disease, unspecified: Secondary | ICD-10-CM

## 2018-08-18 DIAGNOSIS — F17200 Nicotine dependence, unspecified, uncomplicated: Secondary | ICD-10-CM | POA: Diagnosis not present

## 2018-08-18 DIAGNOSIS — E785 Hyperlipidemia, unspecified: Secondary | ICD-10-CM | POA: Insufficient documentation

## 2018-08-18 DIAGNOSIS — I70202 Unspecified atherosclerosis of native arteries of extremities, left leg: Secondary | ICD-10-CM | POA: Diagnosis not present

## 2018-08-18 DIAGNOSIS — I10 Essential (primary) hypertension: Secondary | ICD-10-CM | POA: Insufficient documentation

## 2018-08-18 DIAGNOSIS — E1151 Type 2 diabetes mellitus with diabetic peripheral angiopathy without gangrene: Secondary | ICD-10-CM | POA: Insufficient documentation

## 2018-08-18 DIAGNOSIS — Z9862 Peripheral vascular angioplasty status: Secondary | ICD-10-CM

## 2018-08-18 NOTE — Progress Notes (Signed)
[facility] [place]   CC: Follow up peripheral artery occlusive disease  History of Present Illness Lisa [name] is a 57 y.o. female returns for follow-up today for peripheral arterial disease. Sheis s/pangioplasty of a left superficial femoral artery stenosis on 04-30-17 by Dr. Carmie Kanner claudication symptoms. She states her left leg is much improved. She still complains of some pain in her back with ambulation. She has known lumbar spine disc disease.She took Plavix until September 2018 as advised.  Dr. Oneida Alar last evaluated pt on 08-12-17. At that timeduplex ultrasound of her left lower extremity showed awidely patent left superficial femoral artery. ABI on the left was 0.94 right was 0.91 The patient was on aspirin. She was wishing to start back on ibuprofen because of her degenerative arthritis problems. She was tofollow-up in 3 months for repeat duplex of her left leg as well as bilateral ABIs.   She denies any history of stroke or MI.  She had an event in which she briefly could not speak, was evaluated, and states she was told that she likely had a migraine with no pain and no aura.  Diabetic: Yes, pt states last A1C result was 6.5 Tobacco NKN:[id number] (1/2ppd, decreased from 2-3 ppd, started in late teens), is taking Wellbutrin which has decreased her craving, ran out 3-4 days ago, will obtain refill  Pt meds include: Statin :Yes Betablocker:yes ASA:Yes, 81 mg, had gastric pain with 325 mg daily, has no problems with 81 mg daily. She denies any GI bleed hx, denies GI ulcers hx, denies black stools, denies any other bleeding issue Other anticoagulants/antiplatelets:no    Past Medical History:  Diagnosis Date  . Allergic rhinitis   . Asthma   . Breast nodule    right breast, to see dr Helane Rima 06-20-2012 for   . Chest pain, non-cardiac   . Chronic headaches   . Cigarette smoker   . Complication of anesthesia 05-17-2012   trouble  breathing after colonscopy, needed nebulizer  . Diverticulosis   . GERD (gastroesophageal reflux disease)   . Hepatic hemangioma   . Hypertension   . IBS (irritable bowel syndrome)   . Obesity   . Prosthetic eye globe   . Pyloric erosion   . Sleep apnea    CPAP setting varies from 4-10  . Type 2 diabetes mellitus ([place]) 03/11/2017    Social History Social History   Tobacco Use  . Smoking status: Current Every Day Smoker    Years: 25.00    Types: Cigarettes  . Smokeless tobacco: Never Used  . Tobacco comment: 10 cigarettes per day  Substance Use Topics  . Alcohol use: No    Alcohol/week: 0.0 standard drinks  . Drug use: No    Family History Family History  Problem Relation Age of Onset  . Hypertension Mother   . Lung cancer Mother        41  . Diabetes Maternal Grandmother   . Heart disease Father        pacemaker or defibrillator  . Heart attack Paternal Uncle   . Breast cancer Unknown   . Colon cancer Neg Hx     Past Surgical History:  Procedure Laterality Date  . ABDOMINAL AORTOGRAM W/LOWER EXTREMITY N/A 04/30/2017   Procedure: Abdominal Aortogram w/Lower Extremity;  Surgeon: Elam Dutch, MD;  Location: [facility];  Service: Cardiovascular;  Laterality: N/A;  . CARDIAC CATHETERIZATION  12/28/2007   EF 75%. IT REVEALS NORMAL/SUPRANORMAL LEFT VENTRICULAR SYSTOLIC FUNCTION  .  CARDIOVASCULAR STRESS TEST  03/25/2007   EF 78%  . CESAREAN SECTION  2003, 2009   x 2  . CHOLECYSTECTOMY  1993  . COLONOSCOPY  2014  . ENDOMETRIAL ABLATION  09/2010   and D&C  . HERNIA REPAIR  5188   umbilical  . LAPAROSCOPY    . PERIPHERAL VASCULAR BALLOON ANGIOPLASTY Left 04/30/2017   Procedure: Peripheral Vascular Balloon Angioplasty;  Surgeon: Elam Dutch, MD;  Location: [facility];  Service: Cardiovascular;  Laterality: Left;  SFA  . TUBAL LIGATION    . UMBILICAL HERNIA REPAIR  2004  . US ECHOCARDIOGRAPHY  05/08/2008   EF 55-60%  . VENTRAL HERNIA REPAIR   06/15/2012   Procedure: LAPAROSCOPIC VENTRAL HERNIA;  Surgeon: Edward Jolly, MD;  Location: [facility];  Service: General;  Laterality: N/A;  LAPAROSCOPIC REPAIR VENTRAL HERNIA  . VIDEO BRONCHOSCOPY Bilateral 11/15/2017   Procedure: VIDEO BRONCHOSCOPY WITH FLUORO;  Surgeon: Collene Gobble, MD;  Location: [facility];  Service: Cardiopulmonary;  Laterality: Bilateral;    Allergies  Allergen Reactions  . Almond Oil Anaphylaxis and Nausea And Vomiting    Almond-Nuts Only  . Apple Anaphylaxis  . Ceftin [Cefuroxime] Other (See Comments)    Severe stomach pain - Diverticulitis flares up  . Cherry Extract Anaphylaxis    Cherry fruit only   . Other Anaphylaxis and Itching    Most fruits- Are NOT tolerated (PLEASE ASK PATIENT BEFORE GIVING, AS CERTAIN FRUITS ARE TOLERATED IN LIMITED QUANTITIES!!)  . Guaifenesin & Derivatives Other (See Comments)    Restlessness, jittery  . Hctz [Hydrochlorothiazide] Other (See Comments)    Causes a headache  . Morphine And Related Other (See Comments)    Moodiness.  Causes anger.  . Naproxen Other (See Comments)    Insomnia  . Lisinopril Cough         Current Outpatient Medications  Medication Sig Dispense Refill  . albuterol (PROVENTIL HFA;VENTOLIN HFA) 108 (90 Base) MCG/ACT inhaler Inhale 2 puffs into the lungs every 6 (six) hours as needed for wheezing or shortness of breath. 1 Inhaler 3  . aspirin EC 81 MG tablet Take 81 mg by mouth daily.    Marland Kitchen atorvastatin (LIPITOR) 40 MG tablet TAKE 1 TABLET (40 MG TOTAL) BY MOUTH DAILY. 90 tablet 3  . buPROPion (WELLBUTRIN SR) 150 MG 12 hr tablet Take 150 mg by mouth in the morning  0  . carvedilol (COREG) 25 MG tablet TAKE 1 TABLET (25 MG TOTAL) BY MOUTH 2 (TWO) TIMES DAILY. 180 tablet 3  . dicyclomine (BENTYL) 10 MG capsule Take 1 capsule (10 mg total) by mouth 3 (three) times daily before meals. As needed 90 capsule 11  . losartan (COZAAR) 100 MG tablet Take 1 tablet (100 mg total) by mouth daily. 90  tablet 1  . ONE TOUCH LANCETS MISC Use to check blood sugars twice a day 200 each 0  . Semaglutide (OZEMPIC) 0.25 or 0.5 MG/DOSE SOPN Inject 0.5 mg into the skin once a week. 2 pen 5  . spironolactone (ALDACTONE) 50 MG tablet TAKE 1 TABLET (50 MG TOTAL) BY MOUTH DAILY. 90 tablet 3  . traMADol (ULTRAM) 50 MG tablet Take 1 tablet (50 mg total) by mouth every 6 (six) hours as needed. 15 tablet 0  . liraglutide (VICTOZA) 18 MG/3ML SOPN Inject 0.1 mLs (0.6 mg total) into the skin daily. (Patient not taking: Reported on 08/18/2018) 1 pen 2  . pantoprazole (PROTONIX) 20 MG tablet Take 1 tablet (20  mg total) by mouth daily. 30 tablet 0  . pantoprazole (PROTONIX) 40 MG tablet Take 1 tablet (40 mg total) by mouth daily. (Patient not taking: Reported on 08/18/2018) 30 tablet 11   No current facility-administered medications for this visit.     ROS: See HPI for pertinent positives and negatives.   Physical Examination  Vitals:   08/18/18 1349  BP: 132/85  Pulse: 72  Resp: 18  Temp: 97.6 F (36.4 C)  TempSrc: Oral  SpO2: 97%  Weight: 263 lb (119.3 kg)  Height: 5' 5.75" (1.67 m)   Body mass index is 42.77 kg/m.  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 Neck: large neck, Acanthosis nigricans encircling neck Pulmonary: Respirations are non labored, CTAB,distant breath sounds in all fields Cardiac:regularrhythmand rate, no detected murmur.    Carotid Bruits Right Left   Negative Negative   Radial pulses are2+palpable bilaterally  Adominal aortic pulse isnotpalpable   VASCULAR EXAM: Extremitieswithoutischemic changes, withoutGangrene; withoutopen wounds. Several thick toenails   LE Pulses Right Left  FEMORAL faintlypalpable (large panus) 1+palpable   POPLITEAL notpalpable  notpalpable   POSTERIOR TIBIAL notpalpable  notpalpable   DORSALIS PEDIS ANTERIOR TIBIAL 2+palpable  2+palpable    Abdomen: soft, NT, large asymptomatic reducible ventral hernia,large panus. Skin: no rashes, no cellulitis, no ulcers noted. Musculoskeletal: no muscle wasting or atrophy.      Neurologic: A&O X 3; appropriate affect, Sensation is normal; MOTOR FUNCTION:  moving all extremities equally, motor strength 5/5 throughout. Speech is fluent/normal. CN 2-12 intact. + pain elicited in low back with resistance applied to right leg when pt raises her right leg. Psychiatric: Thought content is normal, mood appropriate for clinical situation.    ASSESSMENT: AYIANA WINSLETT is a 57 y.o. female who iss/pangioplasty of a left superficial femoral artery stenosis on 04-30-17.  There are no signs of ischemia in her feet or legs.  She walks a great deal at her current job.   Her atherosclerotic risk factors include 2-3 ppd smoking until recently down to 1/2 ppd (started in her teens), currently controlled DM, morbid obesity , and obstructive sleep apnea (uses CPAP).  She takes an 81 mg ASA and a statin daily.   + pain in low back with pt raising right leg against resistance; see Plan.  ABI's are normal, pain is not due to lack of arterial perfusion and is consistent with sciatica.   DATA  Left LE Arterial Duplex (08-18-18): 50-74% stenosis in the left proximal SFA (244 cm/s, was 344 cm/s). All triphasic waveforms. No significant change compared to the examson 11-11-17 and 02-11-18.   ABI (Date: 08/18/2018):  R:   ABI: 1.11 (was 1.04 on 02-11-18),   PT: tri  DP: tri  TBI:  0.76, toe pressure 93 (was 0.78)  L:   ABI: 1.10 (was 1.04),   PT: waveform morphology not documented (was bi)  DP: waveform morphology not documented (was bi)   TBI: 0.77, toe pressure 94, (was 0.73) Bilateral ABI and TBI remain normal.     PLAN:  Referral to spine and  ortho to evaluate bilateral hip and low back pain.   Based on the patient's vascular studies and examination,  pt will return to clinic in 9 months with left LE arterial duplex and ABI's. I advised her to notify us if she develops concerns re the circulation in her feet or legs.   The patient  was again counseled re smoking cessation and given several free resources re smoking cessation..   I discussed in depth with the patient the nature of atherosclerosis, and emphasized the importance of maximal medical management including strict control of blood pressure, blood glucose, and lipid levels, obtaining regular exercise, and cessation of smoking.  The patient is aware that without maximal medical management the underlying atherosclerotic disease process will progress, limiting the benefit of any interventions.  The patient was given information about PAD including signs, symptoms, treatment, what symptoms should prompt the patient to seek immediate medical care, and risk reduction measures to take.  Clemon Chambers, RN, MSN, FNP-C [facility] Phone: [phone]  Clinic MD: Oneida Alar  08/18/18 1:51 PM

## 2018-08-18 NOTE — Patient Instructions (Addendum)
Steps to Quit Smoking Smoking tobacco can be bad for your health. It can also affect almost every organ in your body. Smoking puts you and people around you at risk for many serious long-lasting (chronic) diseases. Quitting smoking is hard, but it is one of the best things that you can do for your health. It is never too late to quit. What are the benefits of quitting smoking? When you quit smoking, you lower your risk for getting serious diseases and conditions. They can include:  Lung cancer or lung disease.  Heart disease.  Stroke.  Heart attack.  Not being able to have children (infertility).  Weak bones (osteoporosis) and broken bones (fractures).  If you have coughing, wheezing, and shortness of breath, those symptoms may get better when you quit. You may also get sick less often. If you are pregnant, quitting smoking can help to lower your chances of having a baby of low birth weight. What can I do to help me quit smoking? Talk with your doctor about what can help you quit smoking. Some things you can do (strategies) include:  Quitting smoking totally, instead of slowly cutting back how much you smoke over a period of time.  Going to in-person counseling. You are more likely to quit if you go to many counseling sessions.  Using resources and support systems, such as: ? Online chats with a counselor. ? Phone quitlines. ? Printed self-help materials. ? Support groups or group counseling. ? Text messaging programs. ? Mobile phone apps or applications.  Taking medicines. Some of these medicines may have nicotine in them. If you are pregnant or breastfeeding, do not take any medicines to quit smoking unless your doctor says it is okay. Talk with your doctor about counseling or other things that can help you.  Talk with your doctor about using more than one strategy at the same time, such as taking medicines while you are also going to in-person counseling. This can help make  quitting easier. What things can I do to make it easier to quit? Quitting smoking might feel very hard at first, but there is a lot that you can do to make it easier. Take these steps:  Talk to your family and friends. Ask them to support and encourage you.  Call phone quitlines, reach out to support groups, or work with a counselor.  Ask people who smoke to not smoke around you.  Avoid places that make you want (trigger) to smoke, such as: ? Bars. ? Parties. ? Smoke-break areas at work.  Spend time with people who do not smoke.  Lower the stress in your life. Stress can make you want to smoke. Try these things to help your stress: ? Getting regular exercise. ? Deep-breathing exercises. ? Yoga. ? Meditating. ? Doing a body scan. To do this, close your eyes, focus on one area of your body at a time from head to toe, and notice which parts of your body are tense. Try to relax the muscles in those areas.  Download or buy apps on your mobile phone or tablet that can help you stick to your quit plan. There are many free apps, such as QuitGuide from the CDC (Centers for Disease Control and Prevention). You can find more support from smokefree.gov and other websites.  This information is not intended to replace advice given to you by your health care provider. Make sure you discuss any questions you have with your health care provider. Document Released: 09/12/2009 Document   Revised: 07/14/2016 Document Reviewed: 04/02/2015 Elsevier Interactive Patient Education  2018 Elsevier Inc.     Peripheral Vascular Disease Peripheral vascular disease (PVD) is a disease of the blood vessels that are not part of your heart and brain. A simple term for PVD is poor circulation. In most cases, PVD narrows the blood vessels that carry blood from your heart to the rest of your body. This can result in a decreased supply of blood to your arms, legs, and internal organs, like your stomach or kidneys.  However, it most often affects a person's lower legs and feet. There are two types of PVD.  Organic PVD. This is the more common type. It is caused by damage to the structure of blood vessels.  Functional PVD. This is caused by conditions that make blood vessels contract and tighten (spasm).  Without treatment, PVD tends to get worse over time. PVD can also lead to acute ischemic limb. This is when an arm or limb suddenly has trouble getting enough blood. This is a medical emergency. Follow these instructions at home:  Take medicines only as told by your doctor.  Do not use any tobacco products, including cigarettes, chewing tobacco, or electronic cigarettes. If you need help quitting, ask your doctor.  Lose weight if you are overweight, and maintain a healthy weight as told by your doctor.  Eat a diet that is low in fat and cholesterol. If you need help, ask your doctor.  Exercise regularly. Ask your doctor for some good activities for you.  Take good care of your feet. ? Wear comfortable shoes that fit well. ? Check your feet often for any cuts or sores. Contact a doctor if:  You have cramps in your legs while walking.  You have leg pain when you are at rest.  You have coldness in a leg or foot.  Your skin changes.  You are unable to get or have an erection (erectile dysfunction).  You have cuts or sores on your feet that are not healing. Get help right away if:  Your arm or leg turns cold and blue.  Your arms or legs become red, warm, swollen, painful, or numb.  You have chest pain or trouble breathing.  You suddenly have weakness in your face, arm, or leg.  You become very confused or you cannot speak.  You suddenly have a very bad headache.  You suddenly cannot see. This information is not intended to replace advice given to you by your health care provider. Make sure you discuss any questions you have with your health care provider. Document Released:  02/10/2010 Document Revised: 04/23/2016 Document Reviewed: 04/26/2014 Elsevier Interactive Patient Education  2017 Elsevier Inc.  

## 2018-08-20 ENCOUNTER — Other Ambulatory Visit: Payer: Self-pay | Admitting: Family Medicine

## 2018-09-08 ENCOUNTER — Other Ambulatory Visit: Payer: Self-pay | Admitting: Family Medicine

## 2018-09-13 ENCOUNTER — Encounter: Payer: Self-pay | Admitting: Gastroenterology

## 2018-09-13 ENCOUNTER — Other Ambulatory Visit: Payer: Self-pay | Admitting: Family

## 2018-09-13 ENCOUNTER — Other Ambulatory Visit: Payer: Self-pay | Admitting: Emergency Medicine

## 2018-09-13 ENCOUNTER — Other Ambulatory Visit: Payer: Self-pay | Admitting: Family Medicine

## 2018-09-13 ENCOUNTER — Ambulatory Visit (INDEPENDENT_AMBULATORY_CARE_PROVIDER_SITE_OTHER): Payer: Managed Care, Other (non HMO) | Admitting: Gastroenterology

## 2018-09-13 ENCOUNTER — Ambulatory Visit (INDEPENDENT_AMBULATORY_CARE_PROVIDER_SITE_OTHER): Payer: Managed Care, Other (non HMO)

## 2018-09-13 VITALS — BP 142/80 | HR 80 | Ht 60.0 in | Wt 265.1 lb

## 2018-09-13 DIAGNOSIS — R1010 Upper abdominal pain, unspecified: Secondary | ICD-10-CM | POA: Diagnosis not present

## 2018-09-13 DIAGNOSIS — K219 Gastro-esophageal reflux disease without esophagitis: Secondary | ICD-10-CM | POA: Diagnosis not present

## 2018-09-13 DIAGNOSIS — Z23 Encounter for immunization: Secondary | ICD-10-CM

## 2018-09-13 DIAGNOSIS — R140 Abdominal distension (gaseous): Secondary | ICD-10-CM | POA: Diagnosis not present

## 2018-09-13 NOTE — Patient Instructions (Signed)
Increase your dicyclomine 10 mg to 2 tablets (20 mg) by mouth three times a day. Take them more frequently and not as needed.   You can take over the counter Gas-X three times a day.   You have been given a gas prevention diet.   Call if your symptoms are not better.   Normal BMI (Body Mass Index- based on height and weight) is between 19 and 25. Your BMI today is Body mass index is 51.78 kg/m. Marland Kitchen Please consider follow up  regarding your BMI with your Primary Care Provider.  Thank you for choosing me and [facility].  Pricilla Riffle. Dagoberto Ligas., MD., Marval Regal

## 2018-09-13 NOTE — Progress Notes (Signed)
    History of Present Illness: This is a 57 year old female with multiple complaints including upper abdominal pain, abdominal bloating, soft and loose stools, gas, nausea, belching.  Previous reflux symptoms are now under good control.  Dicyclomine is been beneficial for her symptoms and she takes it once a day and then a second dose as needed.   Current Medications, Allergies, Past Medical History, Past Surgical History, Family History and Social History were reviewed in [employer] record.  Physical Exam: General: Well developed, well nourished, obese, no acute distress Head: Normocephalic and atraumatic Eyes:  sclerae anicteric, EOMI Ears: Normal auditory acuity Mouth: No deformity or lesions Lungs: Clear throughout to auscultation Heart: Regular rate and rhythm; no murmurs, rubs or bruits Abdomen: Soft, large, non tender and non distended. No masses, hepatosplenomegaly or hernias noted. Normal Bowel sounds Rectal: Not done Musculoskeletal: Symmetrical with no gross deformities  Pulses:  Normal pulses noted Extremities: No clubbing, cyanosis, edema or deformities noted Neurological: Alert oriented x 4, grossly nonfocal Psychological:  Alert and cooperative. Normal mood and affect  Assessment and Recommendations:  1. GERD.  Follow standard antireflux measures and pantoprazole 40 mg daily.  2. IBS, intestinal gas.  Inadequate symptom control.  Increase dicyclomine to 20 mg p.o. 3 times daily ac and I have advised her to take it regularly for the next few weeks instead of as needed.  Begin Gas-X 3 times daily.  Begin a low gas diet.  Avoid foods that trigger symptoms.  Consider glycopyrrolate or IBgard if symptoms not adequately controlled.  If symptoms are not adequately controlled consider further evaluation with an abdominal/pelvic CT and then endoscopic studies.

## 2018-09-14 DIAGNOSIS — Z23 Encounter for immunization: Secondary | ICD-10-CM

## 2018-09-16 ENCOUNTER — Other Ambulatory Visit: Payer: Self-pay | Admitting: Cardiovascular Disease

## 2018-09-16 ENCOUNTER — Other Ambulatory Visit: Payer: Self-pay | Admitting: Family Medicine

## 2018-09-16 MED ORDER — CARVEDILOL 25 MG PO TABS: 25.0000 mg | ORAL_TABLET | Freq: Two times a day (BID) | ORAL | 0 refills | Status: DC

## 2018-09-16 NOTE — Telephone Encounter (Signed)
Called pt and left message informing pt to call back to set up an yearly appt with Dr. Acie Fredrickson for November for future refills and that I sent in a 30 day supply requesting that pt call and set up an appt. Confirmation received.

## 2018-09-16 NOTE — Telephone Encounter (Signed)
New message   *STAT* If patient is at the pharmacy, call can be transferred to refill team.   1. Which medications need to be refilled? (please list name of each medication and dose if known)   carvedilol (COREG) 25 MG tablet     2. Which pharmacy/location (including street and city if local pharmacy) is medication to be sent to?[id number] - [place], [place] - [address]  3. Do they need a 30 day or 90 day supply?[place]

## 2018-10-07 ENCOUNTER — Ambulatory Visit (INDEPENDENT_AMBULATORY_CARE_PROVIDER_SITE_OTHER): Payer: Self-pay

## 2018-10-07 ENCOUNTER — Ambulatory Visit (INDEPENDENT_AMBULATORY_CARE_PROVIDER_SITE_OTHER): Payer: Managed Care, Other (non HMO) | Admitting: Orthopaedic Surgery

## 2018-10-07 ENCOUNTER — Ambulatory Visit (INDEPENDENT_AMBULATORY_CARE_PROVIDER_SITE_OTHER): Payer: Managed Care, Other (non HMO)

## 2018-10-07 ENCOUNTER — Other Ambulatory Visit (INDEPENDENT_AMBULATORY_CARE_PROVIDER_SITE_OTHER): Payer: Self-pay | Admitting: Radiology

## 2018-10-07 ENCOUNTER — Encounter (INDEPENDENT_AMBULATORY_CARE_PROVIDER_SITE_OTHER): Payer: Self-pay | Admitting: Orthopaedic Surgery

## 2018-10-07 VITALS — BP 136/90 | HR 83 | Ht 60.0 in | Wt 265.0 lb

## 2018-10-07 DIAGNOSIS — M25552 Pain in left hip: Secondary | ICD-10-CM

## 2018-10-07 DIAGNOSIS — M25562 Pain in left knee: Secondary | ICD-10-CM | POA: Diagnosis not present

## 2018-10-07 DIAGNOSIS — M545 Low back pain, unspecified: Secondary | ICD-10-CM

## 2018-10-07 DIAGNOSIS — G8929 Other chronic pain: Secondary | ICD-10-CM | POA: Diagnosis not present

## 2018-10-07 DIAGNOSIS — M25551 Pain in right hip: Secondary | ICD-10-CM

## 2018-10-07 NOTE — Progress Notes (Signed)
Office Visit Note   Patient: Lisa [name]           Date of Birth: 1961/10/31           MRN: [id number] Visit Date: 10/07/2018              Requested by: Elam Dutch, MD [address] [place], [place] [address] PCP: Marin Olp, MD   Assessment & Plan: Visit Diagnoses:  1. Pain in left hip   2. Pain in right hip   3. Chronic midline low back pain, unspecified whether sciatica present   4. Chronic pain of left knee     Plan: Chronic low back pain related to arthritis based on her MRI scan of April 2018.  There is evidence of stenosis of the lateral recess more on the left than the right which only could causing her left leg pain.  There are multiple potential etiologies including diabetic neuropathy and possibly the mild arthritis of her left hip.  I think the next best approach would be epidural steroid injection to see if this helps with her back and her left leg pain.  We will schedule this and have her return within the next month.  Monitor result of epidural steroid injection.  Consider further evaluation of her left hip joint pain  Follow-Up Instructions: No follow-ups on file.   Orders:  Orders Placed This Encounter  Procedures  . XR Lumbar Spine 2-3 Views  . XR KNEE 3 VIEW LEFT  . XR HIPS BILAT W OR W/O PELVIS 3-4 VIEWS   No orders of the defined types were placed in this encounter.     Procedures: No procedures performed   Clinical Data: No additional findings.   Subjective: Chief Complaint  Patient presents with  . New Patient (Initial Visit)    L KNEE PAIN 2-3 YRS AND WAS INJECTED KNEE 2-3 YRS NO SURGERY OR NEW INJURY. BACK PAIN 15 YRS NO INJURY, INJECTION OR SURGERY  Lisa [name] is 57 years old and visited the office for evaluation of chronic back and bilateral lower extremity pain.  She is had trouble for "years".  She does have a history of diabetes and sleep apnea.  She has an issue with peripheral vascular disease and has had a prior left  femoral artery stent.  She is had a recent follow-up with excellent flow.  There is no suggestion that her present left leg pain is related to vascular compromise.  She did have an MRI scan of the lumbar spine performed by her primary care physician in April 2000 I have a copy of the report demonstrating mild disc bulge at L4-5.  There is bilateral facet osteoarthritis with mild hypertrophy and mild stenosis of the lateral recesses left more than right.  This could potentially affect the L5 nerve roots particularly on the left.  There is also mild facet osteoarthritis left more than right at L5-S1 no central stenosis. She is experiencing as much leg pain as she is back pain.  Her job does require her to sit most of the day.  She does have some tingling in her feet notes that her blood sugars are "in good control".  Recent injury or trauma.  HPI  Review of Systems  Constitutional: Negative for fatigue and fever.  HENT: Negative for ear pain.   Eyes: Negative for pain.  Respiratory: Negative for cough and shortness of breath.   Cardiovascular: Positive for leg swelling.  Gastrointestinal: Negative for constipation and  diarrhea.  Genitourinary: Negative for difficulty urinating.  Musculoskeletal: Positive for back pain and neck pain.  Skin: Negative for rash.  Allergic/Immunologic: Positive for food allergies.  Neurological: Positive for weakness and numbness.  Hematological: Does not bruise/bleed easily.  Psychiatric/Behavioral: Positive for sleep disturbance.     Objective: Vital Signs: BP 136/90 (BP Location: Left Arm, Patient Position: Sitting, Cuff Size: Normal)   Pulse 83   Ht 5' (1.524 m)   Wt 265 lb (120.2 kg)   BMI 51.75 kg/m   Physical Exam  Constitutional: She is oriented to person, place, and time. She appears well-developed and well-nourished.  HENT:  Mouth/Throat: Oropharynx is clear and moist.  Eyes: Pupils are equal, round, and reactive to light. EOM are normal.    Pulmonary/Chest: Effort normal.  Neurological: She is alert and oriented to person, place, and time.  Skin: Skin is warm and dry.  Psychiatric: She has a normal mood and affect. Her behavior is normal.    Ortho Exam awake alert and oriented x3.  Comfortable sitting BMI 52 pleasant.  Walks without a limp.  Straight leg raise is negative bilaterally.  Some percussible tenderness to lower lumbar spine particularly to the left.  No particular pain with internal/external rotation of either hip particularly the left.  No distal edema.  Feet were warm.  Does have some tingling in her left foot.  Does walk with a limp that appears to be referable to her left lower extremity.  Etiology unclear.  Could be related to the mild arthritis of her left hip  Specialty Comments:  No specialty comments available.  Imaging: No results found.   PMFS History: Patient Active Problem List   Diagnosis Date Noted  . Bilateral leg pain 04/14/2018  . Hyperlipidemia 04/12/2018  . Essential hypertension 12/20/2017  . Pulmonary infiltrates   . S/P bronchoscopy with biopsy   . Hyperlipidemia associated with type 2 diabetes mellitus ([place]) 09/06/2017  . Breast mass 08/03/2017  . Chronic hip pain 07/29/2017  . Type 2 diabetes mellitus ([place]) 03/11/2017  . PAD (peripheral artery disease) ([place]) 01/19/2017  . 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 Left knee pain 05/15/2016  . Flank pain 04/21/2016  . Abdominal pain 02/28/2016  . GERD (gastroesophageal reflux disease) 02/14/2016  . Morbid obesity ([place]) 02/14/2016  . Tobacco use disorder 08/12/2015  . Pulmonary nodule, right 04/11/2015  . COPD (chronic obstructive pulmonary disease) ([place]) 01/18/2015  . Mediastinal lymphadenopathy 01/18/2015  . DOE (dyspnea on exertion) 03/08/2014  . Migraine without aura 07/26/2013  . Sleep apnea 02/11/2012  . Chest discomfort 10/12/2011  . Hypertension associated with diabetes ([place])  10/12/2011   Past Medical History:  Diagnosis Date  . Allergic rhinitis   . Asthma   . Breast nodule    right breast, to see dr Helane Rima 06-20-2012 for   . Chest pain, non-cardiac   . Chronic headaches   . Cigarette smoker   . Complication of anesthesia 05-17-2012   trouble breathing after colonscopy, needed nebulizer  . Diverticulosis   . GERD (gastroesophageal reflux disease)   . Hepatic hemangioma   . Hypertension   . IBS (irritable bowel syndrome)   . Obesity   . Prosthetic eye globe   . Pyloric erosion   . Sleep apnea    CPAP setting varies from 4-10  . Type 2 diabetes mellitus ([place]) 03/11/2017    Family History  Problem Relation Age of Onset  . Hypertension Mother   . Lung cancer  Mother        47  . Diabetes Maternal Grandmother   . Heart disease Father        pacemaker or defibrillator  . Heart attack Paternal Uncle   . Breast cancer Unknown   . Colon cancer Neg Hx     Past Surgical History:  Procedure Laterality Date  . ABDOMINAL AORTOGRAM W/LOWER EXTREMITY N/A 04/30/2017   Procedure: Abdominal Aortogram w/Lower Extremity;  Surgeon: Elam Dutch, MD;  Location: [facility];  Service: Cardiovascular;  Laterality: N/A;  . CARDIAC CATHETERIZATION  12/28/2007   EF 75%. IT REVEALS NORMAL/SUPRANORMAL LEFT VENTRICULAR SYSTOLIC FUNCTION  . CARDIOVASCULAR STRESS TEST  03/25/2007   EF 78%  . CESAREAN SECTION  2003, 2009   x 2  . CHOLECYSTECTOMY  1993  . COLONOSCOPY  2014  . ENDOMETRIAL ABLATION  09/2010   and D&C  . HERNIA REPAIR  1610   umbilical  . LAPAROSCOPY    . PERIPHERAL VASCULAR BALLOON ANGIOPLASTY Left 04/30/2017   Procedure: Peripheral Vascular Balloon Angioplasty;  Surgeon: Elam Dutch, MD;  Location: [facility];  Service: Cardiovascular;  Laterality: Left;  SFA  . TUBAL LIGATION    . UMBILICAL HERNIA REPAIR  2004  . US ECHOCARDIOGRAPHY  05/08/2008   EF 55-60%  . VENTRAL HERNIA REPAIR  06/15/2012   Procedure: LAPAROSCOPIC VENTRAL  HERNIA;  Surgeon: Edward Jolly, MD;  Location: [facility];  Service: General;  Laterality: N/A;  LAPAROSCOPIC REPAIR VENTRAL HERNIA  . VIDEO BRONCHOSCOPY Bilateral 11/15/2017   Procedure: VIDEO BRONCHOSCOPY WITH FLUORO;  Surgeon: Collene Gobble, MD;  Location: [facility];  Service: Cardiopulmonary;  Laterality: Bilateral;   Social History   Occupational History  . Occupation: stay at home mom  . Occupation: Ship broker  Tobacco Use  . Smoking status: Current Every Day Smoker    Years: 25.00    Types: Cigarettes  . Smokeless tobacco: Never Used  . Tobacco comment: 10 cigarettes per day  Substance and Sexual Activity  . Alcohol use: No    Alcohol/week: 0.0 standard drinks  . Drug use: No  . Sexual activity: Yes    Comment: intercourse age 58, less than 5 sexual partners,des neg

## 2018-10-08 ENCOUNTER — Other Ambulatory Visit: Payer: Self-pay | Admitting: Cardiovascular Disease

## 2018-10-12 ENCOUNTER — Ambulatory Visit (INDEPENDENT_AMBULATORY_CARE_PROVIDER_SITE_OTHER): Payer: Managed Care, Other (non HMO) | Admitting: Emergency Medicine

## 2018-10-12 ENCOUNTER — Encounter: Payer: Self-pay | Admitting: Emergency Medicine

## 2018-10-12 ENCOUNTER — Other Ambulatory Visit: Payer: Self-pay | Admitting: Cardiovascular Disease

## 2018-10-12 ENCOUNTER — Other Ambulatory Visit: Payer: Managed Care, Other (non HMO)

## 2018-10-12 DIAGNOSIS — J84115 Respiratory bronchiolitis interstitial lung disease: Secondary | ICD-10-CM

## 2018-10-12 DIAGNOSIS — F17200 Nicotine dependence, unspecified, uncomplicated: Secondary | ICD-10-CM | POA: Diagnosis not present

## 2018-10-12 DIAGNOSIS — J849 Interstitial pulmonary disease, unspecified: Secondary | ICD-10-CM

## 2018-10-12 NOTE — Assessment & Plan Note (Addendum)
Great compliance with her CPAP.  She has good clinical response and clinical benefit.  Less daytime sleepiness, better sleep at night.  Less fatigue, better functional capacity.  We will try to work with Lisa [name] to get your CPAP equipment more effectively

## 2018-10-12 NOTE — Assessment & Plan Note (Signed)
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  She has never had biopsies.  I will repeat her high risk CT.  We will continue to try to push smoking cessation.  In the meantime I will check autoimmune labs again.

## 2018-10-12 NOTE — Patient Instructions (Addendum)
We will repeat your CT chest  We will perform lab work today Keep your albuterol available to use 2 puffs as needed for shortness of breath, chest tightness, wheezing. Congratulations on decreasing your smoking.  You need to continue to work hard on decreasing.  Our goal will be to get down to 5 cigarettes daily.  Once you are at that point we will make a plan to try stopping altogether. We will refill your bupropion. Pneumonia vaccine is up-to-date Flu shot up-to-date. Follow with Dr Lamonte Sakai next available after your CT scan of the chest so that we can discuss the results together.

## 2018-10-12 NOTE — Assessment & Plan Note (Signed)
Most important issue right now is smoking cessation.  Tried to offer her assistance with this.  She is using albuterol as needed.  If her usage increases then would consider long-acting medication

## 2018-10-12 NOTE — Progress Notes (Signed)
   Subjective:    Patient ID: Lisa [name], female    DOB: 11-04-1961, 57 y.o.   MRN: [id number]  Shortness of Breath  Pertinent negatives include no ear pain, fever, headaches, leg swelling, rash, rhinorrhea, sore throat, vomiting or wheezing.   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  Her most recent scan was 09/2017, prompted bronchoscopy in 10/2017 that was negative for malignancy, culture negative. She also has OSA on CPAP, great compliance. She follows with Dr Fuller Plan for GERD and IBS. She is feeling fairly well, less limited by her breathing. Continues to work. Has albuterol available, rarely uses it. Running out of bupropion.    She has cut her cigarettes to 1/2 pk a day. Having trouble getting her CPAP supplies from [place], unclear why.     Review of Systems  Constitutional: Negative for fever and unexpected weight change.  HENT: Negative for congestion, dental problem, ear pain, nosebleeds, postnasal drip, rhinorrhea, sinus pressure, sneezing, sore throat and trouble swallowing.   Eyes: Negative for redness and itching.  Respiratory: Positive for cough. Negative for chest tightness, shortness of breath and wheezing.   Cardiovascular: Negative for palpitations and leg swelling.  Gastrointestinal: Negative for nausea and vomiting.  Genitourinary: Negative for dysuria.  Musculoskeletal: Negative for joint swelling.  Skin: Negative for rash.  Neurological: Negative for headaches.  Hematological: Does not bruise/bleed easily.  Psychiatric/Behavioral: Negative for dysphoric mood. The patient is not nervous/anxious.         Objective:   Physical Exam Vitals:   10/12/18 1448  BP: 140/86  Pulse: 79  SpO2: 100%  Weight: 266 lb (120.7 kg)  Height: 5\' 4"  (1.626 m)   Gen: Pleasant, obese, in no distress,  normal affect  ENT: No  lesions,  mouth clear, L ptosis  Neck: No JVD, no stridor  Lungs: No use of accessory muscles, clear without rales or rhonchi  Cardiovascular: RRR, heart sounds normal, no murmur or gallops, trace peripheral edema  Musculoskeletal: No deformities, no cyanosis or clubbing  Neuro: alert, non focal  Skin: Warm, no lesions or rashes       Assessment & Plan:  COPD (chronic obstructive pulmonary disease) ([place]) Most important issue right now is smoking cessation.  Tried to offer her assistance with this.  She is using albuterol as needed.  If her usage increases then would consider long-acting medication  Sleep apnea Great compliance with her CPAP.  She has good clinical response and clinical benefit.  Less daytime sleepiness, better sleep at night.  Less fatigue, better functional capacity.  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  She has never had biopsies.  I will repeat her high risk CT.  We will continue to try to push smoking cessation.  In the meantime I will check autoimmune labs again.  Tobacco use disorder Discussed cessation with her today, strategies for decreasing and then ultimately setting a quit date.  Baltazar Apo, MD, PhD 10/12/2018, 3:25 PM [facility] [phone] or if no answer [phone]

## 2018-10-12 NOTE — Assessment & Plan Note (Signed)
Discussed cessation with her today, strategies for decreasing and then ultimately setting a quit date.

## 2018-10-13 LAB — ANTI-SMITH ANTIBODY: ENA SM AB SER-ACNC: NEGATIVE AI

## 2018-10-13 LAB — ANTI-JO 1 ANTIBODY, IGG

## 2018-10-14 LAB — ANTI-DNA ANTIBODY, DOUBLE-STRANDED: ds DNA Ab: <1 IU/mL

## 2018-10-14 LAB — SJOGREN'S SYNDROME ANTIBODS(SSA + SSB)
SSA (Ro) (ENA) Antibody, IgG: <1 AI
SSB (LA) (ENA) ANTIBODY, IGG: NEGATIVE AI

## 2018-10-14 LAB — RHEUMATOID FACTOR: Rhuematoid fact SerPl-aCnc: <14 IU/mL (ref <14)

## 2018-10-14 LAB — ANTI-NUCLEAR AB-TITER (ANA TITER)

## 2018-10-14 LAB — ANA: ANA: POSITIVE — AB

## 2018-10-14 LAB — RNP ANTIBODY: Ribonucleic Protein(ENA) Antibody, IgG: <1 AI

## 2018-10-14 LAB — CEA: CEA: 4.8 ng/mL — AB

## 2018-10-14 LAB — CANCER ANTIGEN 19-9: CA 19-9: 10 U/mL (ref <34)

## 2018-10-14 LAB — ALDOLASE: ALDOLASE: 4 U/L (ref <=8.1)

## 2018-10-14 LAB — ANTI-SCLERODERMA ANTIBODY: Scleroderma (Scl-70) (ENA) Antibody, IgG: <1 AI

## 2018-10-14 LAB — CYCLIC CITRUL PEPTIDE ANTIBODY, IGG: Cyclic Citrullin Peptide Ab: <16 UNITS

## 2018-10-19 ENCOUNTER — Ambulatory Visit
Admission: RE | Admit: 2018-10-19 | Discharge: 2018-10-19 | Disposition: A | Discharge status: Home / Self Care | Payer: Managed Care, Other (non HMO) | Source: Ambulatory Visit | Attending: Orthopaedic Surgery | Admitting: Orthopaedic Surgery

## 2018-10-19 DIAGNOSIS — M545 Low back pain, unspecified: Secondary | ICD-10-CM

## 2018-10-19 DIAGNOSIS — G8929 Other chronic pain: Secondary | ICD-10-CM

## 2018-10-19 MED ORDER — METHYLPREDNISOLONE ACETATE 40 MG/ML INJ SUSP (RADIOLOG
120.0000 mg | Freq: Once | INTRAMUSCULAR | Status: AC
  Administered 2018-10-19: 120 mg via EPIDURAL

## 2018-10-19 MED ORDER — IOPAMIDOL (ISOVUE-M 200) INJECTION 41%
1.0000 mL | Freq: Once | INTRAMUSCULAR | Status: AC
  Administered 2018-10-19: 1 mL via EPIDURAL

## 2018-10-19 NOTE — Discharge Instructions (Signed)

## 2018-10-20 ENCOUNTER — Other Ambulatory Visit: Payer: Self-pay | Admitting: Cardiovascular Disease

## 2018-10-20 MED ORDER — CARVEDILOL 25 MG PO TABS: 25.0000 mg | ORAL_TABLET | Freq: Two times a day (BID) | ORAL | 0 refills | Status: DC

## 2018-10-20 NOTE — Addendum Note (Signed)
Addended by: Derl Barrow on: 10/20/2018 03:27 PM   Modules accepted: Orders

## 2018-10-25 ENCOUNTER — Ambulatory Visit: Payer: Managed Care, Other (non HMO) | Admitting: Gastroenterology

## 2018-11-02 ENCOUNTER — Ambulatory Visit (INDEPENDENT_AMBULATORY_CARE_PROVIDER_SITE_OTHER)
Admission: RE | Admit: 2018-11-02 | Discharge: 2018-11-02 | Disposition: A | Discharge status: Home / Self Care | Payer: Managed Care, Other (non HMO) | Source: Ambulatory Visit | Attending: Emergency Medicine | Admitting: Emergency Medicine

## 2018-11-02 DIAGNOSIS — J849 Interstitial pulmonary disease, unspecified: Secondary | ICD-10-CM | POA: Diagnosis not present

## 2018-11-17 IMAGING — CT CT HEAD W/O CM
4 series · 16 of 47 positions shown, 18 images · non-contrast
Comparison: 04/03/2008

CLINICAL DATA: Aphasia

EXAM:
CT HEAD WITHOUT CONTRAST
TECHNIQUE: Contiguous axial images were obtained from the base of the skull
through the vertex without intravenous contrast.

[Series 3: head wo · axial · 0.46mm/px · z∈[-116,+4]mm · 7 of 33 slices shown, 9 images]
[im 5/33  brain]
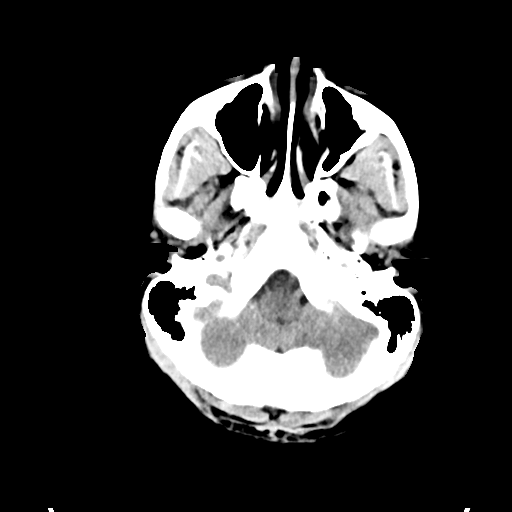
[im 5/33  bone]
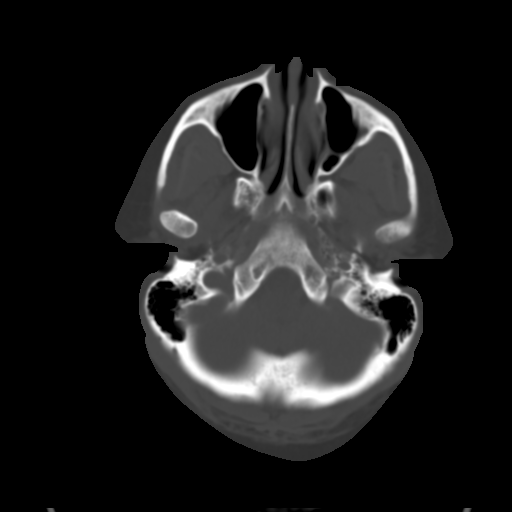
[im 9/33  brain]
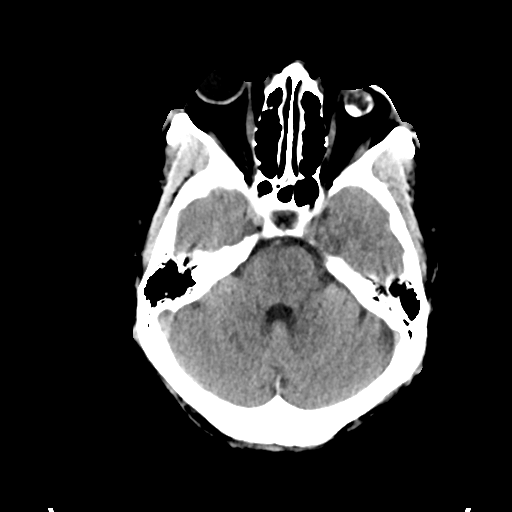
[im 13/33  brain]
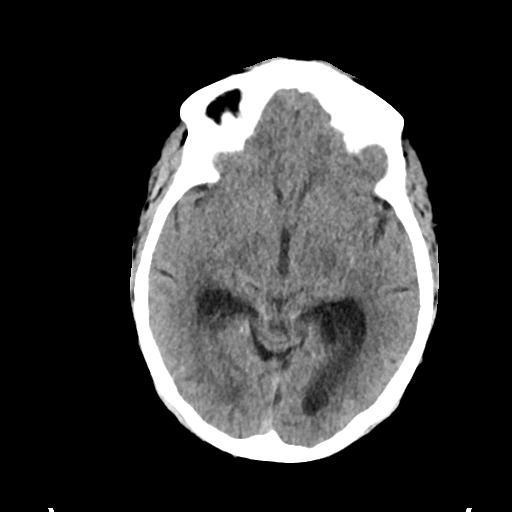
[im 17/33  brain]
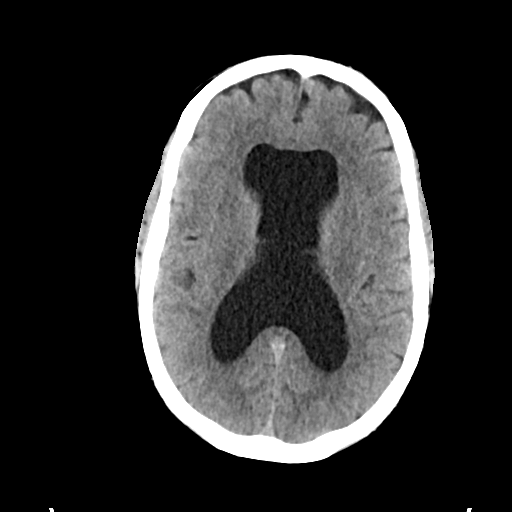
[im 21/33  brain]
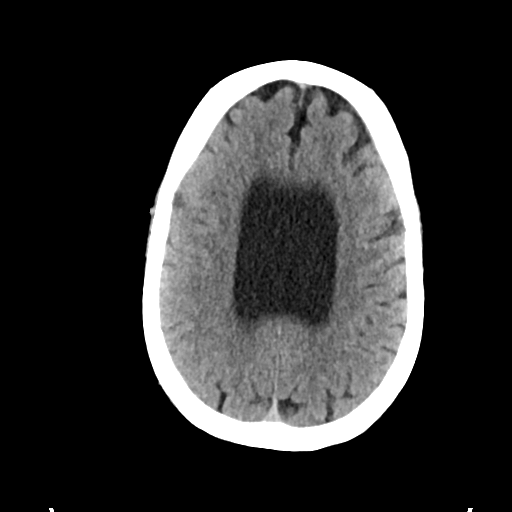
[im 21/33  bone]
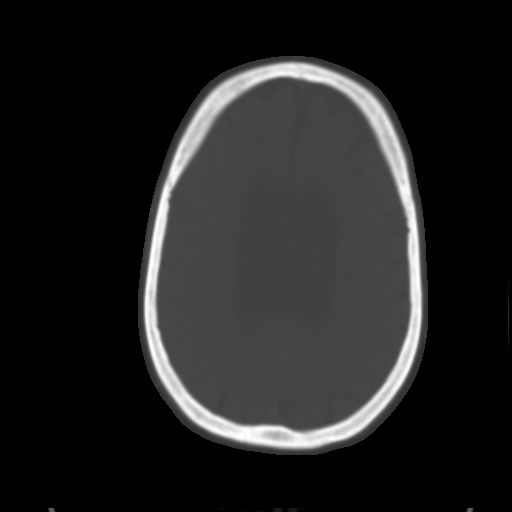
[im 25/33  brain]
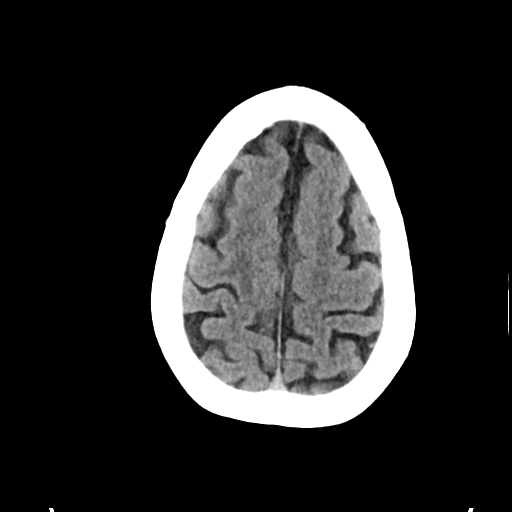
[im 29/33  brain]
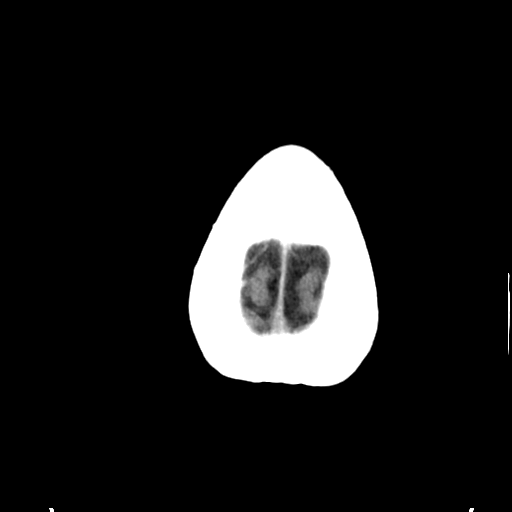

[Series 4: head bone · axial · 0.46mm/px · z∈[-120,-88]mm · 3 of 82 slices shown]
[im 9/82  bone]
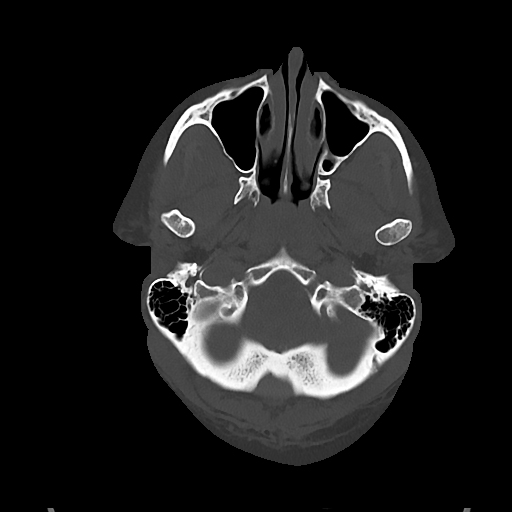
[im 17/82  bone]
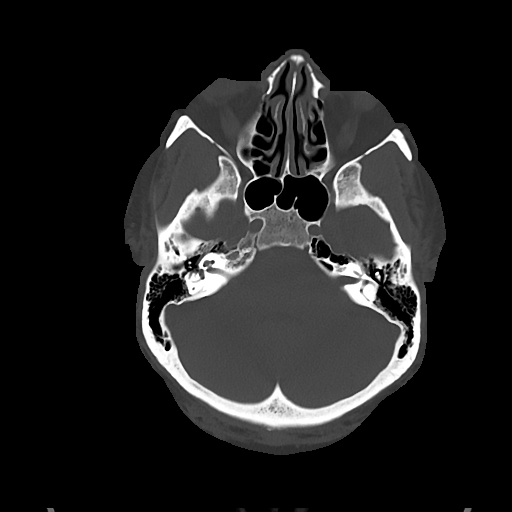
[im 25/82  bone]
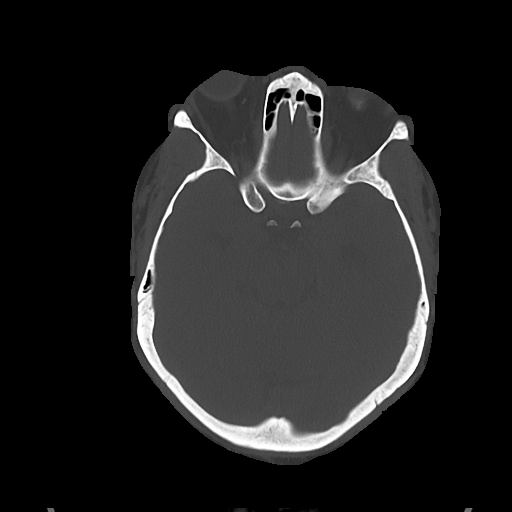

[Series 5: cor soft · coronal · 0.34mm/px · 3 of 71 slices shown]
[im 24/71  brain]
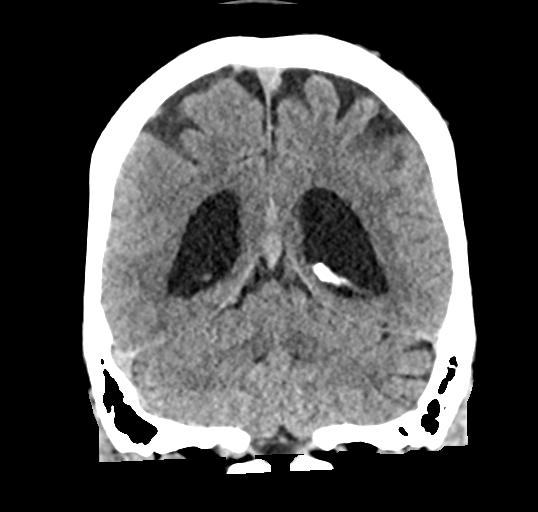
[im 32/71  brain]
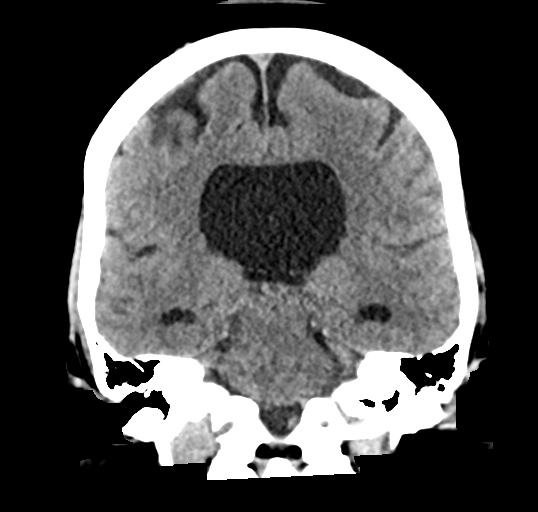
[im 39/71  brain]
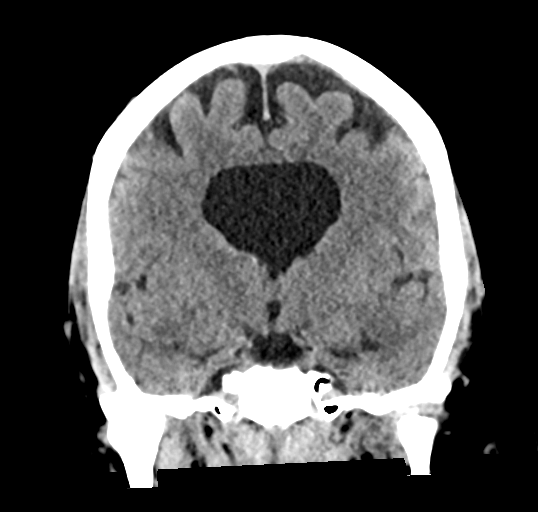

[Series 6: sag soft · sagittal · 0.32mm/px · 3 of 63 slices shown]
[im 21/63  brain]
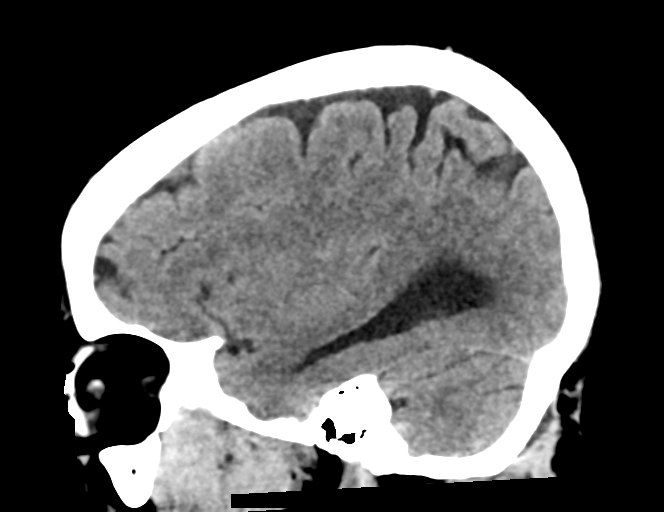
[im 32/63  brain]
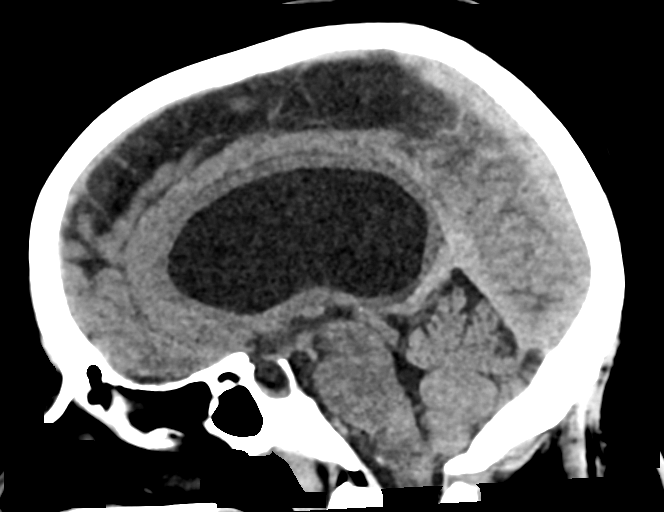
[im 42/63  brain]
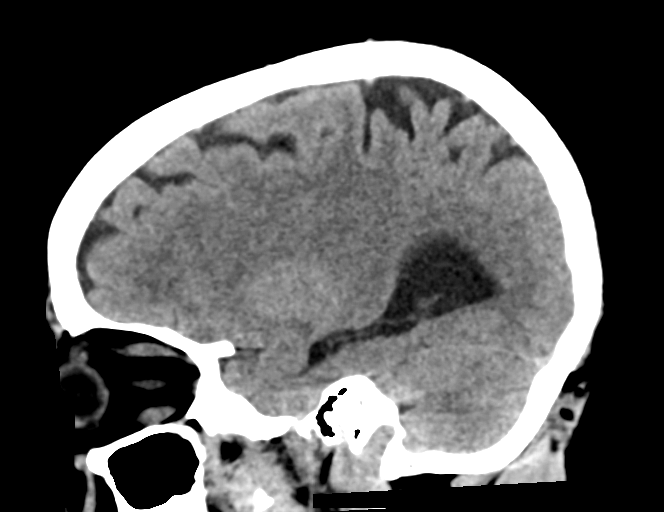

[16 of 47 positions shown; findings below may reference images not displayed]

FINDINGS: Brain: Stable lateral ventricular enlargement is noted. No findings
to suggest acute hemorrhage, acute infarction or space-occupying
mass lesion are noted.

Vascular: No hyperdense vessel or unexpected calcification.

Skull: Normal. Negative for fracture or focal lesion.

Sinuses/Orbits: Sinuses are well aerated bilaterally.
Normal-appearing right orbit is seen. Calcific changes in the left
globe are noted with stable decrease in size of left globe. A
prosthesis is noted anteriorly also stable from the prior exam.

Other: None.
IMPRESSION: Chronic changes without acute abnormality.

## 2018-11-18 ENCOUNTER — Encounter: Payer: Self-pay | Admitting: Emergency Medicine

## 2018-11-18 ENCOUNTER — Ambulatory Visit (INDEPENDENT_AMBULATORY_CARE_PROVIDER_SITE_OTHER): Payer: Managed Care, Other (non HMO) | Admitting: Emergency Medicine

## 2018-11-18 DIAGNOSIS — J449 Chronic obstructive pulmonary disease, unspecified: Secondary | ICD-10-CM

## 2018-11-18 DIAGNOSIS — R911 Solitary pulmonary nodule: Secondary | ICD-10-CM

## 2018-11-18 DIAGNOSIS — J84115 Respiratory bronchiolitis interstitial lung disease: Secondary | ICD-10-CM

## 2018-11-18 DIAGNOSIS — G4730 Sleep apnea, unspecified: Secondary | ICD-10-CM

## 2018-11-18 DIAGNOSIS — F17200 Nicotine dependence, unspecified, uncomplicated: Secondary | ICD-10-CM

## 2018-11-18 NOTE — Patient Instructions (Signed)
Your CT scan of the chest shows persistent mild inflammatory change.  This is probably less noticeable than on your previous CT scan.  Your pulmonary nodule is stable and can be deemed benign. Please keep your albuterol available in case she need to use it for shortness of breath, chest tightness, wheezing. Continue your CPAP every night as you have been using it Congratulations on decreasing your cigarettes.  Please continue to work on this.  Our next goal will be to get down to 5 cigarettes daily.  Ultimately we will hope to set a quit date and stop during 2020. Follow with Dr Lamonte Sakai in 6 months or sooner if you have any problems

## 2018-11-18 NOTE — Assessment & Plan Note (Signed)
Stable

## 2018-11-18 NOTE — Assessment & Plan Note (Addendum)
Her CT scan of the chest shows patchy scattered areas of groundglass infiltrate most consistent with an inflammatory process, possibly some component of air trapping.  Somewhat more subtle than on her previous scan in 2017 but clearly still present.  I repeated her autoimmune labs and all were negative with the exception of a mildly increased ANA.  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  She understands this and is working hard to quit.

## 2018-11-18 NOTE — Assessment & Plan Note (Signed)
Minimal albuterol use, minimal dyspnea or other COPD symptoms.  Most important thing right now is for her to work on smoking cessation.  She does have albuterol available to use if needed

## 2018-11-18 NOTE — Progress Notes (Signed)
Subjective:    Patient ID: Lisa [name], female    DOB: Dec 18, 1960, 57 y.o.   MRN: [id number]  Shortness of Breath  Pertinent negatives include no ear pain, fever, headaches, leg swelling, rash, rhinorrhea, sore throat, vomiting or wheezing.   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  Her most recent scan was 09/2017, prompted bronchoscopy in 10/2017 that was negative for malignancy, culture negative. She also has OSA on CPAP, great compliance. She follows with Dr Fuller Plan for GERD and IBS. She is feeling fairly well, less limited by her breathing. Continues to work. Has albuterol available, rarely uses it. Running out of bupropion.    She has cut her cigarettes to 1/2 pk a day. Having trouble getting her CPAP supplies from [place], unclear why.   ROV 11/18/18 --Lisa [name] is 38, active smoker with a history of abnormal CT consistent with RB-ILD.  She also has documented COPD, obstructive sleep apnea on CPAP. She has cut down to 8 cigarettes daily. She has albuterol, never needs it. Good compliance with CPAP, good clinical response, more energy and less daytime sleepiness.  Checked autoimmune labs last time which were all negative with the exception of a mildly positive ANA, cytoplasmic dil 1:80.   She had CT chest 11/02/18 that I reviewed.  This shows some very subtle patchy groundglass versus air trapping, probably groundglass given her previous scans and history.  Appears to be slightly less noticeable than in 2017 but clearly still present.  Some scattered 4 mm or smaller pulmonary nodules are stable in size, deemed benign.   Review of Systems  Constitutional: Negative for fever and unexpected weight change.  HENT: Negative for congestion, dental problem, ear pain, nosebleeds, postnasal drip, rhinorrhea, sinus pressure, sneezing, sore throat and  trouble swallowing.   Eyes: Negative for redness and itching.  Respiratory: Positive for cough. Negative for chest tightness, shortness of breath and wheezing.   Cardiovascular: Negative for palpitations and leg swelling.  Gastrointestinal: Negative for nausea and vomiting.  Genitourinary: Negative for dysuria.  Musculoskeletal: Negative for joint swelling.  Skin: Negative for rash.  Neurological: Negative for headaches.  Hematological: Does not bruise/bleed easily.  Psychiatric/Behavioral: Negative for dysphoric mood. The patient is not nervous/anxious.         Objective:   Physical Exam Vitals:   11/18/18 1634  BP: 110/78  Pulse: 87  SpO2: 100%  Weight: 265 lb (120.2 kg)  Height: 5\' 4"  (1.626 m)   Gen: Pleasant, obese, in no distress,  normal affect  ENT: No lesions,  mouth clear, L ptosis  Neck: No JVD, no stridor  Lungs: No use of accessory muscles, clear without rales or rhonchi  Cardiovascular: RRR, heart sounds normal, no murmur or gallops, trace peripheral edema  Musculoskeletal: No deformities, no cyanosis or clubbing  Neuro: alert, non focal  Skin: Warm, no lesions or rashes       Assessment & Plan:  Sleep apnea Good compliance with a good clinical response to her CPAP.  Less daytime sleepiness, more energy.  She has a good machine with good equipment.  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  Somewhat more subtle than on her previous scan  in 2017 but clearly still present.  I repeated her autoimmune labs and all were negative with the exception of a mildly increased ANA.  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  She understands this and is working hard to quit.  COPD (chronic obstructive  pulmonary disease) ([facility]) Minimal albuterol use, minimal dyspnea or other COPD symptoms.  Most important thing right now is for her to work on smoking cessation.  She does have albuterol available to use if needed  Pulmonary nodule, right Stable  Tobacco use disorder Discussed cessation with her.  She is slowly cutting down, next goal will be to get to 5 cigarettes daily.  Baltazar Apo, MD, PhD 11/18/2018, 5:19 PM [facility] [phone] or if no answer [phone]

## 2018-11-18 NOTE — Assessment & Plan Note (Signed)
Good compliance with a good clinical response to her CPAP.  Less daytime sleepiness, more energy.  She has a good machine with good equipment.  Continue same therapy

## 2018-11-18 NOTE — Assessment & Plan Note (Signed)
Discussed cessation with her.  She is slowly cutting down, next goal will be to get to 5 cigarettes daily.

## 2018-11-21 ENCOUNTER — Other Ambulatory Visit: Payer: Self-pay | Admitting: *Deleted

## 2018-11-21 MED ORDER — CARVEDILOL 25 MG PO TABS: ORAL_TABLET | ORAL | 2 refills | Status: DC

## 2018-12-02 ENCOUNTER — Encounter: Payer: Self-pay | Admitting: Physician Assistant

## 2018-12-02 ENCOUNTER — Ambulatory Visit (INDEPENDENT_AMBULATORY_CARE_PROVIDER_SITE_OTHER): Payer: Managed Care, Other (non HMO) | Admitting: Physician Assistant

## 2018-12-02 VITALS — BP 130/80 | HR 88 | Temp 98.9°F | Ht 64.0 in | Wt 269.0 lb

## 2018-12-02 DIAGNOSIS — R05 Cough: Secondary | ICD-10-CM

## 2018-12-02 DIAGNOSIS — R6889 Other general symptoms and signs: Secondary | ICD-10-CM

## 2018-12-02 DIAGNOSIS — R059 Cough, unspecified: Secondary | ICD-10-CM

## 2018-12-02 LAB — POC INFLUENZA A&B (BINAX/QUICKVUE)
Influenza A, POC: NEGATIVE
Influenza B, POC: NEGATIVE

## 2018-12-02 MED ORDER — AZITHROMYCIN 250 MG PO TABS: ORAL_TABLET | ORAL | 0 refills | Status: DC

## 2018-12-02 NOTE — Patient Instructions (Signed)
It was great to see you!  Flu test is negative.  Use medication as prescribed: azithromycin antibiotic  Push fluids and get plenty of rest. Please return if you are not improving as expected, or if you have high fevers (>101.5) or difficulty swallowing or worsening productive cough.  Call clinic with questions.  I hope you start feeling better soon!

## 2018-12-02 NOTE — Progress Notes (Signed)
Lisa [name] is a 58 y.o. female here for a new problem.  I acted as a Education administrator for [employer], PA-C Anselmo Pickler, LPN  History of Present Illness:   Chief Complaint  Patient presents with  . Cough    Cough  This is a new problem. Episode onset: Started 4 days ago. The problem has been gradually worsening. The problem occurs constantly. The cough is productive of sputum (expectorating yellow sputum). Associated symptoms include chills, ear congestion, ear pain (Left ear), headaches, nasal congestion, postnasal drip and a sore throat. Pertinent negatives include no fever, shortness of breath or wheezing. The symptoms are aggravated by lying down. Risk factors for lung disease include occupational exposure. She has tried OTC cough suppressant (Coricedin, Vicks ) for the symptoms. The treatment provided no relief. Her past medical history is significant for bronchitis, COPD and pneumonia.   Multiple coworkers have been sick.  Past Medical History:  Diagnosis Date  . Allergic rhinitis   . Asthma   . Breast nodule    right breast, to see dr Helane Rima 06-20-2012 for   . Chest pain, non-cardiac   . Chronic headaches   . Cigarette smoker   . Complication of anesthesia 05-17-2012   trouble breathing after colonscopy, needed nebulizer  . Diverticulosis   . GERD (gastroesophageal reflux disease)   . Hepatic hemangioma   . Hypertension   . IBS (irritable bowel syndrome)   . Obesity   . Prosthetic eye globe   . Pyloric erosion   . Sleep apnea    CPAP setting varies from 4-10  . Type 2 diabetes mellitus ([place]) 03/11/2017     Social History   Socioeconomic History  . Marital status: Married    Spouse name: Science writer  . Number of children: 2  . Years of education: 14+  . Highest education level: Not on file  Occupational History  . Occupation: stay at home mom  . Occupation: Ship broker  Social Needs  . Financial resource strain: Not on file  . Food insecurity:    Worry: Not on file     Inability: Not on file  . Transportation needs:    Medical: Not on file    Non-medical: Not on file  Tobacco Use  . Smoking status: Current Every Day Smoker    Packs/day: 1.00    Years: 38.00    Pack years: 38.00    Types: Cigarettes  . Smokeless tobacco: Never Used  . Tobacco comment: 10 cigarettes per day  Substance and Sexual Activity  . Alcohol use: No    Alcohol/week: 0.0 standard drinks  . Drug use: No  . Sexual activity: Yes    Comment: intercourse age 63, less than 5 sexual partners,des neg  Lifestyle  . Physical activity:    Days per week: Not on file    Minutes per session: Not on file  . Stress: Not on file  Relationships  . Social connections:    Talks on phone: Not on file    Gets together: Not on file    Attends religious service: Not on file    Active member of club or organization: Not on file    Attends meetings of clubs or organizations: Not on file    Relationship status: Not on file  . Intimate partner violence:    Fear of current or ex partner: Not on file    Emotionally abused: Not on file    Physically abused: Not on file    Forced  sexual activity: Not on file  Other Topics Concern  . Not on file  Social History Narrative   Married 1997. 2 sons 66 and 67 years old in 10/18. Wants to see children grow up.       Customer Service/accounts payable.    Went to school for medical assisting- has had a hard time finding work      Hobbies: [employer], enjoying [employer], getting out of home, wants to take [employer]    Past Surgical History:  Procedure Laterality Date  . ABDOMINAL AORTOGRAM W/LOWER EXTREMITY N/A 04/30/2017   Procedure: Abdominal Aortogram w/Lower Extremity;  Surgeon: Elam Dutch, MD;  Location: [facility];  Service: Cardiovascular;  Laterality: N/A;  . CARDIAC CATHETERIZATION  12/28/2007   EF 75%. IT REVEALS NORMAL/SUPRANORMAL LEFT VENTRICULAR SYSTOLIC FUNCTION  . CARDIOVASCULAR STRESS TEST  03/25/2007   EF 78%  . CESAREAN  SECTION  2003, 2009   x 2  . CHOLECYSTECTOMY  1993  . COLONOSCOPY  2014  . ENDOMETRIAL ABLATION  09/2010   and D&C  . HERNIA REPAIR  2409   umbilical  . LAPAROSCOPY    . PERIPHERAL VASCULAR BALLOON ANGIOPLASTY Left 04/30/2017   Procedure: Peripheral Vascular Balloon Angioplasty;  Surgeon: Elam Dutch, MD;  Location: [facility];  Service: Cardiovascular;  Laterality: Left;  SFA  . TUBAL LIGATION    . UMBILICAL HERNIA REPAIR  2004  . US ECHOCARDIOGRAPHY  05/08/2008   EF 55-60%  . VENTRAL HERNIA REPAIR  06/15/2012   Procedure: LAPAROSCOPIC VENTRAL HERNIA;  Surgeon: Edward Jolly, MD;  Location: [facility];  Service: General;  Laterality: N/A;  LAPAROSCOPIC REPAIR VENTRAL HERNIA  . VIDEO BRONCHOSCOPY Bilateral 11/15/2017   Procedure: VIDEO BRONCHOSCOPY WITH FLUORO;  Surgeon: Collene Gobble, MD;  Location: [facility];  Service: Cardiopulmonary;  Laterality: Bilateral;    Family History  Problem Relation Age of Onset  . Hypertension Mother   . Lung cancer Mother        57  . Diabetes Maternal Grandmother   . Heart disease Father        pacemaker or defibrillator  . Heart attack Paternal Uncle   . Breast cancer Unknown   . Colon cancer Neg Hx     Allergies  Allergen Reactions  . Almond Oil Anaphylaxis and Nausea And Vomiting    Almond-Nuts Only  . Apple Anaphylaxis  . Ceftin [Cefuroxime] Other (See Comments)    Severe stomach pain - Diverticulitis flares up  . Cherry Extract Anaphylaxis    Cherry fruit only   . Other Anaphylaxis and Itching    Most fruits- Are NOT tolerated (PLEASE ASK PATIENT BEFORE GIVING, AS CERTAIN FRUITS ARE TOLERATED IN LIMITED QUANTITIES!!)  . Guaifenesin & Derivatives Other (See Comments)    Restlessness, jittery  . Hctz [Hydrochlorothiazide] Other (See Comments)    Causes a headache  . Morphine And Related Other (See Comments)    Moodiness.  Causes anger.  . Naproxen Other (See Comments)    Insomnia  . Lisinopril Cough          Current Medications:   Current Outpatient Medications:  .  albuterol (PROVENTIL HFA;VENTOLIN HFA) 108 (90 Base) MCG/ACT inhaler, Inhale 2 puffs into the lungs every 6 (six) hours as needed for wheezing or shortness of breath., Disp: 1 Inhaler, Rfl: 3 .  aspirin EC 81 MG tablet, Take 81 mg by mouth daily., Disp: , Rfl:  .  atorvastatin (LIPITOR) 40 MG tablet,  TAKE 1 TABLET (40 MG TOTAL) BY MOUTH DAILY., Disp: 90 tablet, Rfl: 3 .  buPROPion (WELLBUTRIN SR) 150 MG 12 hr tablet, Take 150 mg by mouth in the morning, Disp: , Rfl: 0 .  carvedilol (COREG) 25 MG tablet, TAKE 1 TABLET BY MOUTH 2 TIMES DAILY, Disp: 60 tablet, Rfl: 2 .  dicyclomine (BENTYL) 10 MG capsule, Take 1 capsule (10 mg total) by mouth 3 (three) times daily before meals. As needed, Disp: 90 capsule, Rfl: 11 .  losartan (COZAAR) 100 MG tablet, TAKE 1 TABLET BY MOUTH EVERY DAY, Disp: 90 tablet, Rfl: 1 .  ONE TOUCH LANCETS MISC, Use to check blood sugars twice a day, Disp: 200 each, Rfl: 0 .  OZEMPIC, 0.25 OR 0.5 MG/DOSE, 2 MG/1.5ML SOPN, INJECT 0.5 MG INTO THE SKIN ONCE A WEEK., Disp: 3 pen, Rfl: 8 .  pantoprazole (PROTONIX) 40 MG tablet, Take 1 tablet (40 mg total) by mouth daily., Disp: 30 tablet, Rfl: 11 .  spironolactone (ALDACTONE) 50 MG tablet, TAKE 1 TABLET (50 MG TOTAL) BY MOUTH DAILY., Disp: 90 tablet, Rfl: 3 .  traMADol (ULTRAM) 50 MG tablet, Take 1 tablet (50 mg total) by mouth every 6 (six) hours as needed., Disp: 15 tablet, Rfl: 0 .  azithromycin (ZITHROMAX) 250 MG tablet, Take two tablets on day 1, then one tablet daily x 4 days, Disp: 6 tablet, Rfl: 0   Review of Systems:   Review of Systems  Constitutional: Positive for chills. Negative for fever.  HENT: Positive for ear pain (Left ear), postnasal drip and sore throat.   Respiratory: Positive for cough. Negative for shortness of breath and wheezing.   Neurological: Positive for headaches.    Vitals:   Vitals:   12/02/18 1106  BP: 130/80  Pulse: 88   Temp: 98.9 F (37.2 C)  TempSrc: Oral  SpO2: 98%  Weight: 269 lb (122 kg)  Height: 5\' 4"  (1.626 m)     Body mass index is 46.17 kg/m.  Physical Exam:   Physical Exam Vitals signs and nursing note reviewed.  Constitutional:      General: She is not in acute distress.    Appearance: She is well-developed. She is not ill-appearing or toxic-appearing.  HENT:     Head: Normocephalic and atraumatic.     Right Ear: Tympanic membrane, ear canal and external ear normal. Tympanic membrane is not erythematous, retracted or bulging.     Left Ear: Tympanic membrane, ear canal and external ear normal. Tympanic membrane is not erythematous, retracted or bulging.     Nose: Mucosal edema, congestion and rhinorrhea present. No nasal tenderness.     Right Sinus: Frontal sinus tenderness present. No maxillary sinus tenderness.     Left Sinus: Frontal sinus tenderness present. No maxillary sinus tenderness.     Mouth/Throat:     Mouth: Mucous membranes are moist.     Pharynx: Uvula midline. Posterior oropharyngeal erythema present.     Tonsils: No tonsillar exudate. Swelling: 0 on the right. 0 on the left.  Eyes:     General: Lids are normal.     Conjunctiva/sclera: Conjunctivae normal.  Neck:     Trachea: Trachea normal.  Cardiovascular:     Rate and Rhythm: Normal rate and regular rhythm.     Heart sounds: Normal heart sounds, S1 normal and S2 normal.  Pulmonary:     Effort: Pulmonary effort is normal.     Breath sounds: Normal breath sounds. No decreased breath sounds, wheezing, rhonchi or  rales.  Lymphadenopathy:     Cervical: No cervical adenopathy.  Skin:    General: Skin is warm and dry.  Neurological:     Mental Status: She is alert.  Psychiatric:        Speech: Speech normal.        Behavior: Behavior normal. Behavior is cooperative.    Results for orders placed or performed in visit on 12/02/18  POC Influenza A&B(BINAX/QUICKVUE)  Result Value Ref Range   Influenza A, POC  Negative Negative   Influenza B, POC Negative Negative      Assessment and Plan:   Durenda was seen today for cough.  Diagnoses and all orders for this visit:  Cough -     POC Influenza A&B(BINAX/QUICKVUE)  Flu-like symptoms -     POC Influenza A&B(BINAX/QUICKVUE)  Other orders -     azithromycin (ZITHROMAX) 250 MG tablet; Take two tablets on day 1, then one tablet daily x 4 days   No red flags on exam.  Flu negative.  Discussed likely viral etiology however, given hx of significant lung disease and DM, and overall worsening symptoms, will prescribe azithromycin as safety net. Discussed taking medications as prescribed. Reviewed return precautions including worsening fever, SOB, worsening cough or other concerns. Push fluids and rest. I recommend that patient follow-up if symptoms worsen or persist despite treatment x 7-10 days, sooner if needed.    . Reviewed expectations re: course of current medical issues. . Discussed self-management of symptoms. . Outlined signs and symptoms indicating need for more acute intervention. . Patient verbalized understanding and all questions were answered. . See orders for this visit as documented in the electronic medical record. . Patient received an After-Visit Summary.  CMA or LPN served as scribe during this visit. History, Physical, and Plan performed by medical provider. The above documentation has been reviewed and is accurate and complete.  Inda Coke, PA-C

## 2018-12-07 ENCOUNTER — Other Ambulatory Visit: Payer: Self-pay

## 2018-12-07 MED ORDER — SPIRONOLACTONE 50 MG PO TABS: 50.0000 mg | ORAL_TABLET | Freq: Every day | ORAL | 1 refills | Status: DC

## 2018-12-20 ENCOUNTER — Ambulatory Visit (INDEPENDENT_AMBULATORY_CARE_PROVIDER_SITE_OTHER): Payer: Managed Care, Other (non HMO) | Admitting: Family Medicine

## 2018-12-20 ENCOUNTER — Encounter: Payer: Self-pay | Admitting: Family Medicine

## 2018-12-20 VITALS — BP 128/72 | HR 92 | Temp 98.8°F | Ht 64.0 in | Wt 271.2 lb

## 2018-12-20 DIAGNOSIS — Z6841 Body Mass Index (BMI) 40.0 and over, adult: Secondary | ICD-10-CM

## 2018-12-20 DIAGNOSIS — J441 Chronic obstructive pulmonary disease with (acute) exacerbation: Secondary | ICD-10-CM

## 2018-12-20 DIAGNOSIS — E1165 Type 2 diabetes mellitus with hyperglycemia: Secondary | ICD-10-CM | POA: Diagnosis not present

## 2018-12-20 LAB — POCT GLYCOSYLATED HEMOGLOBIN (HGB A1C): Hemoglobin A1C: 5.7 % — AB (ref 4.0–5.6)

## 2018-12-20 MED ORDER — PREDNISONE 20 MG PO TABS: ORAL_TABLET | ORAL | 0 refills | Status: DC

## 2018-12-20 NOTE — Assessment & Plan Note (Signed)
S:  controlled on Ozempic once a week. Off metformin due to Gi issues. Weight up 2 lbs from last visit.  Lab Results  Component Value Date   HGBA1C 5.7 (A) 12/20/2018   HGBA1C 6.5 04/12/2018   HGBA1C 6.3 07/30/2017  A/P: Very well controlled-I have been seeing low numbers on her point-of-care machine-discussed with patient would not be surprised if this number was higher on phlebotomy next check.  Regardless suspect it is reasonable to use course of prednisone for COPD exacerbation as long as she continues Ozempic-seems to really have helped her in comparison to metformin

## 2018-12-20 NOTE — Patient Instructions (Addendum)
Lab Results  Component Value Date   HGBA1C 5.7 (A) 12/20/2018  Continue Ozempic-seems to be working  I want you to set a goal of exercising at least 3 days a week once you start feeling better.  I also want you to try to focus on drinking water instead of sugar sweetened or artificial sweetener based sodas.  Vegetables are the healthiest food group you can eat generally.   Likely blood sugars look better overall- we can use prednisone now to try to help with your COPD flareup.  I am hoping this clears the cough up within a week.  If you are having lingering symptoms in 2 or 3 weeks or you have new symptoms such as worsening shortness of breath or fever please see [name] back sooner.

## 2018-12-20 NOTE — Progress Notes (Signed)
Subjective:  Lisa [name] is a 58 y.o. year old very pleasant female patient who presents for/with See problem oriented charting ROS-no measured fevers.  No nausea or vomiting reported.  Has had some mild shortness of breath and wheeze.  Complains of cough productive of yellow sputum  Past Medical History-  Patient Active Problem List   Diagnosis Date Noted  . Type 2 diabetes mellitus ([place]) 03/11/2017    Priority: High  . PAD (peripheral artery disease) ([place]) 01/19/2017    Priority: High  . 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 Morbid obesity ([place]) 02/14/2016    Priority: High  . Tobacco use disorder 08/12/2015    Priority: High  . COPD (chronic obstructive pulmonary disease) ([place]) 01/18/2015    Priority: High  . Hyperlipidemia 04/12/2018    Priority: Medium  . Breast mass 08/03/2017    Priority: Medium  . GERD (gastroesophageal reflux disease) 02/14/2016    Priority: Medium  . DOE (dyspnea on exertion) 03/08/2014    Priority: Medium  . Migraine without aura 07/26/2013    Priority: Medium  . Sleep apnea 02/11/2012    Priority: Medium  . Chest discomfort 10/12/2011    Priority: Medium  . Hypertension associated with diabetes ([place]) 10/12/2011    Priority: Medium  . Chronic hip pain 07/29/2017    Priority: Low  . Left knee pain 05/15/2016    Priority: Low  . Flank pain 04/21/2016    Priority: Low  . Abdominal pain 02/28/2016    Priority: Low  . Pulmonary nodule, right 04/11/2015    Priority: Low  . Mediastinal lymphadenopathy 01/18/2015    Priority: Low  . Bilateral leg pain 04/14/2018  . Essential hypertension 12/20/2017  . Pulmonary infiltrates   . S/P bronchoscopy with biopsy   . Hyperlipidemia associated with type 2 diabetes mellitus ([place]) 09/06/2017    Medications- reviewed and updated Current Outpatient Medications  Medication Sig Dispense Refill  . aspirin EC 81 MG tablet Take 81 mg by mouth  daily.    Marland Kitchen atorvastatin (LIPITOR) 40 MG tablet TAKE 1 TABLET (40 MG TOTAL) BY MOUTH DAILY. 90 tablet 3  . buPROPion (WELLBUTRIN SR) 150 MG 12 hr tablet Take 150 mg by mouth in the morning  0  . carvedilol (COREG) 25 MG tablet TAKE 1 TABLET BY MOUTH 2 TIMES DAILY 60 tablet 2  . dicyclomine (BENTYL) 10 MG capsule Take 1 capsule (10 mg total) by mouth 3 (three) times daily before meals. As needed 90 capsule 11  . losartan (COZAAR) 100 MG tablet TAKE 1 TABLET BY MOUTH EVERY DAY 90 tablet 1  . ONE TOUCH LANCETS MISC Use to check blood sugars twice a day 200 each 0  . OZEMPIC, 0.25 OR 0.5 MG/DOSE, 2 MG/1.5ML SOPN INJECT 0.5 MG INTO THE SKIN ONCE A WEEK. 3 pen 8  . pantoprazole (PROTONIX) 40 MG tablet Take 1 tablet (40 mg total) by mouth daily. 30 tablet 11  . spironolactone (ALDACTONE) 50 MG tablet Take 1 tablet (50 mg total) by mouth daily. 90 tablet 1  . traMADol (ULTRAM) 50 MG tablet Take 1 tablet (50 mg total) by mouth every 6 (six) hours as needed. 15 tablet 0  . albuterol (PROVENTIL HFA;VENTOLIN HFA) 108 (90 Base) MCG/ACT inhaler Inhale 2 puffs into the lungs every 6 (six) hours as needed for wheezing or shortness of breath. (Patient not taking: Reported on 12/20/2018) 1 Inhaler 3  . predniSONE (DELTASONE) 20 MG  tablet Take 2 pills for 3 days, 1 pill for 4 days 10 tablet 0   No current facility-administered medications for this visit.     Objective: BP 128/72 (BP Location: Left Arm, Patient Position: Sitting, Cuff Size: Large)   Pulse 92   Temp 98.8 F (37.1 C) (Oral)   Ht 5\' 4"  (1.626 m)   Wt 271 lb 3.2 oz (123 kg)   SpO2 95%   BMI 46.55 kg/m  Gen: NAD, resting comfortably Some yellow discharge in bilateral naris-mild amount, oropharynx largely normal, tympanic membrane is normal CV: RRR no murmurs rubs or gallops Lungs: Intermittent diffuse wheeze without crackles or rhonchi  abdomen: soft/nontender/nondistended/obese Ext: Trace edema Skin: warm, dry Neuro: Speech normal, moves  all extremities  Assessment/Plan:  COPD exacerbation S: Cold started 2-3 weeks ago Saw Inda Coke, PA at our office about 3 weeks ago and started on azithromycin due to concern of sinusitis Negative flu test at that time Had a lot of sinus pressure which got better on the azithromycin Cough persisted since antibiotics Yellow discharge Has lost voice again Has some sore throat Mild left ear pain yesterday Has had headache and chills No measured fever Has had very minimal episodes of shortness of breath Wheezing a fair amount but sometimes improves with cough A/P: 58 year old female with cough, congestion, increased sputum production with change in coloration.  Recently treated with azithromycin for sinusitis and sinus symptoms have largely improved.  I recommend we trial prednisone at this point since A1c has been controlled with follow-up for new or worsening symptoms or failure to resolve current symptoms within a few weeks  Type 2 diabetes mellitus ([place]) S:  controlled on Ozempic once a week. Off metformin due to Gi issues. Weight up 2 lbs from last visit.  Lab Results  Component Value Date   HGBA1C 5.7 (A) 12/20/2018   HGBA1C 6.5 04/12/2018   HGBA1C 6.3 07/30/2017  A/P: Very well controlled-I have been seeing low numbers on her point-of-care machine-discussed with patient would not be surprised if this number was higher on phlebotomy next check.  Regardless suspect it is reasonable to use course of prednisone for COPD exacerbation as long as she continues Ozempic-seems to really have helped her in comparison to metformin   BMI monitoring- elevated BMI and morbid obesity level noted: Body mass index is 46.55 kg/m. Encouraged need for healthy eating, regular exercise, weight loss.    Future Appointments  Date Time Provider [place]  12/22/2018 11:20 AM Gerda Diss, DO [facility]  05/30/2019  3:30 PM Annamaria Boots Candiss Norse, NP [facility]   Will have her team reach out  to schedule 4 month follow-up  Lab/Order associations: Type 2 diabetes mellitus with hyperglycemia, without long-term current use of insulin ([place]) - Plan: POCT glycosylated hemoglobin (Hb A1C)  Meds ordered this encounter  Medications  . predniSONE (DELTASONE) 20 MG tablet    Sig: Take 2 pills for 3 days, 1 pill for 4 days    Dispense:  10 tablet    Refill:  0    Return precautions advised.  Garret Reddish, MD

## 2018-12-21 ENCOUNTER — Ambulatory Visit: Payer: Self-pay

## 2018-12-21 NOTE — Telephone Encounter (Signed)
Outgoing call to Patient who complains of cough  Sometimes it is dry sometimes she can bring that is clear to light yellow mucous.  Temperature 96.  Currently on Zpack.  Denies Cardiac Hx.  Reports Wheezing when laying down.  Seasonal  Asthma.  Patient states she has appointment with  Dr.  Paulla Fore tomorrow. Reviewed Protocol with Patient .  Voiced understanding.  Encouraged Patient to call back if  Sx.  Worsen or if temperature  developes.  Patient voices understanding.      Reason for Disposition . [1] Continuous (nonstop) coughing interferes with work or school AND [2] no improvement using cough treatment per Care Advice  Answer Assessment - Initial Assessment Questions 1. ONSET: "When did the cough begin?"      New Years January 1st 2. SEVERITY: "How bad is the cough today?"      Constant , moderate 3. RESPIRATORY DISTRESS: "Describe your breathing."     wheezing 4. FEVER: "Do you have a fever?" If so, ask: "What is your temperature, how was it measured, and when did it start?"     96 5. SPUTUM: "Describe the color of your sputum" (clear, white, yellow, green)     Clear to light yellow 6. HEMOPTYSIS: "Are you coughing up any blood?" If so ask: "How much?" (flecks, streaks, tablespoons, etc.)     denies 7. CARDIAC HISTORY: "Do you have any history of heart disease?" (e.g., heart attack, congestive heart failure)     denies8. LUNG HISTORY: "Do you have any history of lung disease?"  (e.g., pulmonary embolus, asthma, emphysema)     Seasonal asthsma. 9. PE RISK FACTORS: "Do you have a history of blood clots?" (or: recent major surgery, recent prolonged travel, bedridden)     denies 10. OTHER SYMPTOMS: "Do you have any other symptoms?" (e.g., runny nose, wheezing, chest pain)       wheezing 11. PREGNANCY: "Is there any chance you are pregnant?" "When was your last menstrual period?"      Denies  12. TRAVEL: "Have you traveled out of the country in the last month?" (e.g., travel history,  exposures)       denies  Protocols used: [place]

## 2018-12-22 ENCOUNTER — Ambulatory Visit (INDEPENDENT_AMBULATORY_CARE_PROVIDER_SITE_OTHER): Payer: Managed Care, Other (non HMO) | Admitting: Sports Medicine

## 2018-12-22 ENCOUNTER — Encounter: Payer: Self-pay | Admitting: Sports Medicine

## 2018-12-22 ENCOUNTER — Ambulatory Visit: Payer: Self-pay

## 2018-12-22 VITALS — BP 124/76 | HR 94 | Ht 65.75 in | Wt 268.8 lb

## 2018-12-22 DIAGNOSIS — M65331 Trigger finger, right middle finger: Secondary | ICD-10-CM | POA: Diagnosis not present

## 2018-12-22 NOTE — Patient Instructions (Signed)

## 2018-12-22 NOTE — Progress Notes (Signed)
Lisa [name]. Lisa [name], [name]  Lisa [name] - 58 y.o. female MRN [id number]  Date of birth: 19-Sep-1961  Visit Date: 12/22/2018   PCP: Marin Olp, MD   Referred by: Marin Olp, MD  SUBJECTIVE:  Chief Complaint  Patient presents with  . Right Hand - Follow-up    R 3rd finger trigger finger.  R hand XR- 09/08/17.  R 3rd finger injections - 09/08/17 and 04/21/18.  . Follow-up    R 3rd finger.  R hand XR- 09/08/17.  R 3rd finger injections- 09/08/17 and 04/21/18.    HPI: Recurrent symptoms.  Last trigger finger injection on 04/21/2017 provided good improvement.  Requesting this again today.  REVIEW OF SYSTEMS: Currently being treated for acute respiratory illness, on prednisone.  Otherwise denies fevers or chills.  HISTORY:  Prior history reviewed and updated per electronic medical record.  Social History   Occupational History  . Occupation: stay at home mom  . Occupation: Ship broker  Tobacco Use  . Smoking status: Current Every Day Smoker    Packs/day: 1.00    Years: 38.00    Pack years: 38.00    Types: Cigarettes  . Smokeless tobacco: Never Used  . Tobacco comment: 10 cigarettes per day  Substance and Sexual Activity  . Alcohol use: No    Alcohol/week: 0.0 standard drinks  . Drug use: No  . Sexual activity: Yes    Comment: intercourse age 66, less than 5 sexual partners,des neg   Social History   Social History Narrative   Married 1997. 2 sons 40 and 14 years old in 10/18. Wants to see children grow up.       Customer Service/accounts payable.    Went to school for medical assisting- has had a hard time finding work      Hobbies: [employer], enjoying [place], getting out of home, wants to take [employer] lesson    OBJECTIVE:  VS:  HT:5' 5.75" (167 cm)   WT:268 lb 12.8 oz (121.9 kg)  BMI:43.72    BP:124/76  HR:94bpm  TEMP: ( )  RESP:97 %   PHYSICAL EXAM: Adult female. No acute  distress.  Alert and appropriate. Right middle finger has pain with any type of passive flexion.  She has pain over the A1 pulley.  There is a palpable nodule.   ASSESSMENT   1. Trigger middle finger of right hand     PROCEDURES:  US Guided Injection per procedure note      PLAN:  Pertinent additional documentation may be included in corresponding procedure notes, imaging studies, problem based documentation and patient instructions.  Repeat injection performed today.  She had good improvement with the last 1 like this to be repeated.  Did discuss that terminal treatment for this may ultimately be a surgical release but she would like to defer this at this time.  Activity modifications and the importance of avoiding exacerbating activities (limiting pain to no more than a 4 / 10 during or following activity) recommended and discussed. Discussed red flag symptoms that warrant earlier emergent evaluation and patient voices understanding. No orders of the defined types were placed in this encounter.  Lab Orders  No laboratory test(s) ordered today    Imaging Orders     [name] [facility] POCT ULTRASOUND Referral Orders  No referral(s) requested today    Return if symptoms worsen or fail to improve.  Gerda Diss, [facility] Physician

## 2018-12-22 NOTE — Procedures (Signed)
PROCEDURE NOTE:  Ultrasound Guided: Injection: Right Third finger, A1 pulley Images were obtained and interpreted by myself, Teresa Coombs, DO  Images have been saved and stored to PACS system. Images obtained on: GE S7 Ultrasound machine    ULTRASOUND FINDINGS:  Marked stiffness with essentially 0 tendon glide of the A1 pulley of the third finger.  Injection did improve this.  DESCRIPTION OF PROCEDURE:  The patient's clinical condition is marked by substantial pain and/or significant functional disability. Other conservative therapy has not provided relief, is contraindicated, or not appropriate. There is a reasonable likelihood that injection will significantly improve the patient's pain and/or functional impairment.   After discussing the risks, benefits and expected outcomes of the injection and all questions were reviewed and answered, the patient wished to undergo the above named procedure.  Verbal consent was obtained.  The ultrasound was used to identify the target structure and adjacent neurovascular structures. The skin was then prepped in sterile fashion and the target structure was injected under direct visualization using sterile technique as below:  Single injection performed as below: PREP: Alcohol, Ethel Chloride and 1 cc 1% lidocaine on Insulin Needle APPROACH:direct, single injection, 25g 1.5 in. INJECTATE: 0.5 cc 1% lidocaine, 0.5 cc 0.5% Marcaine and 0.5 cc 40mg /mL DepoMedrol ASPIRATE: None DRESSING: Band-Aid and Buddy taping of the third and fourth fingers  Post procedural instructions including recommending icing and warning signs for infection were reviewed.    This procedure was well tolerated and there were no complications.   IMPRESSION: Succesful Ultrasound Guided: Injection

## 2018-12-24 IMAGING — DX DG HIP (WITH OR WITHOUT PELVIS) 3-4V BILAT
4 series · 4 of 4 positions shown · non-contrast
Comparison: None

CLINICAL DATA: Chronic BILATERAL worsening hip pain for 4 months

EXAM:
DG HIP (WITH OR WITHOUT PELVIS) 3-4V BILAT

[hip ap (1 of 2)]
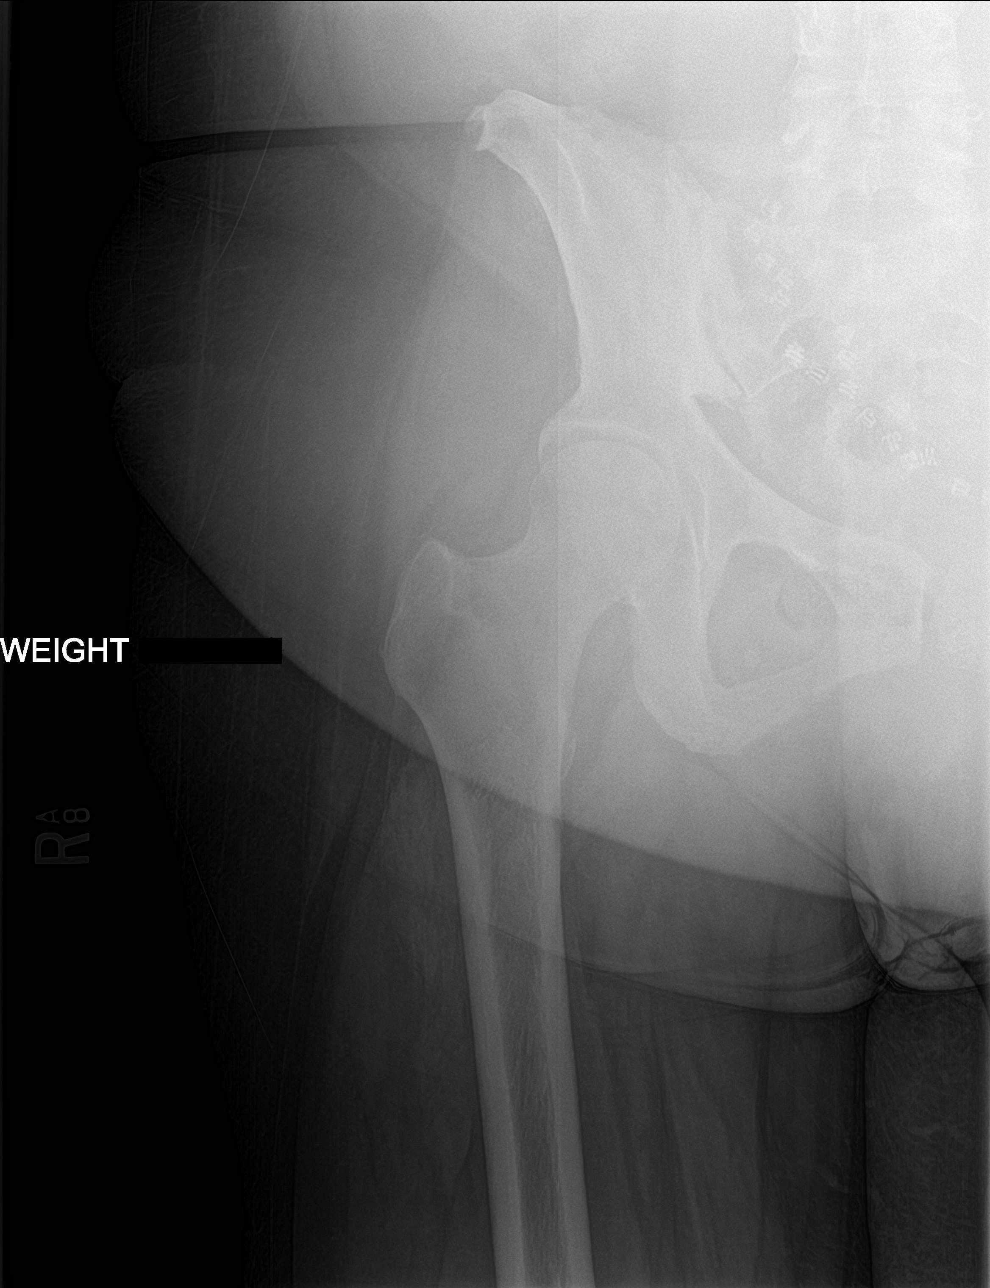

[hip lat (1 of 2)]
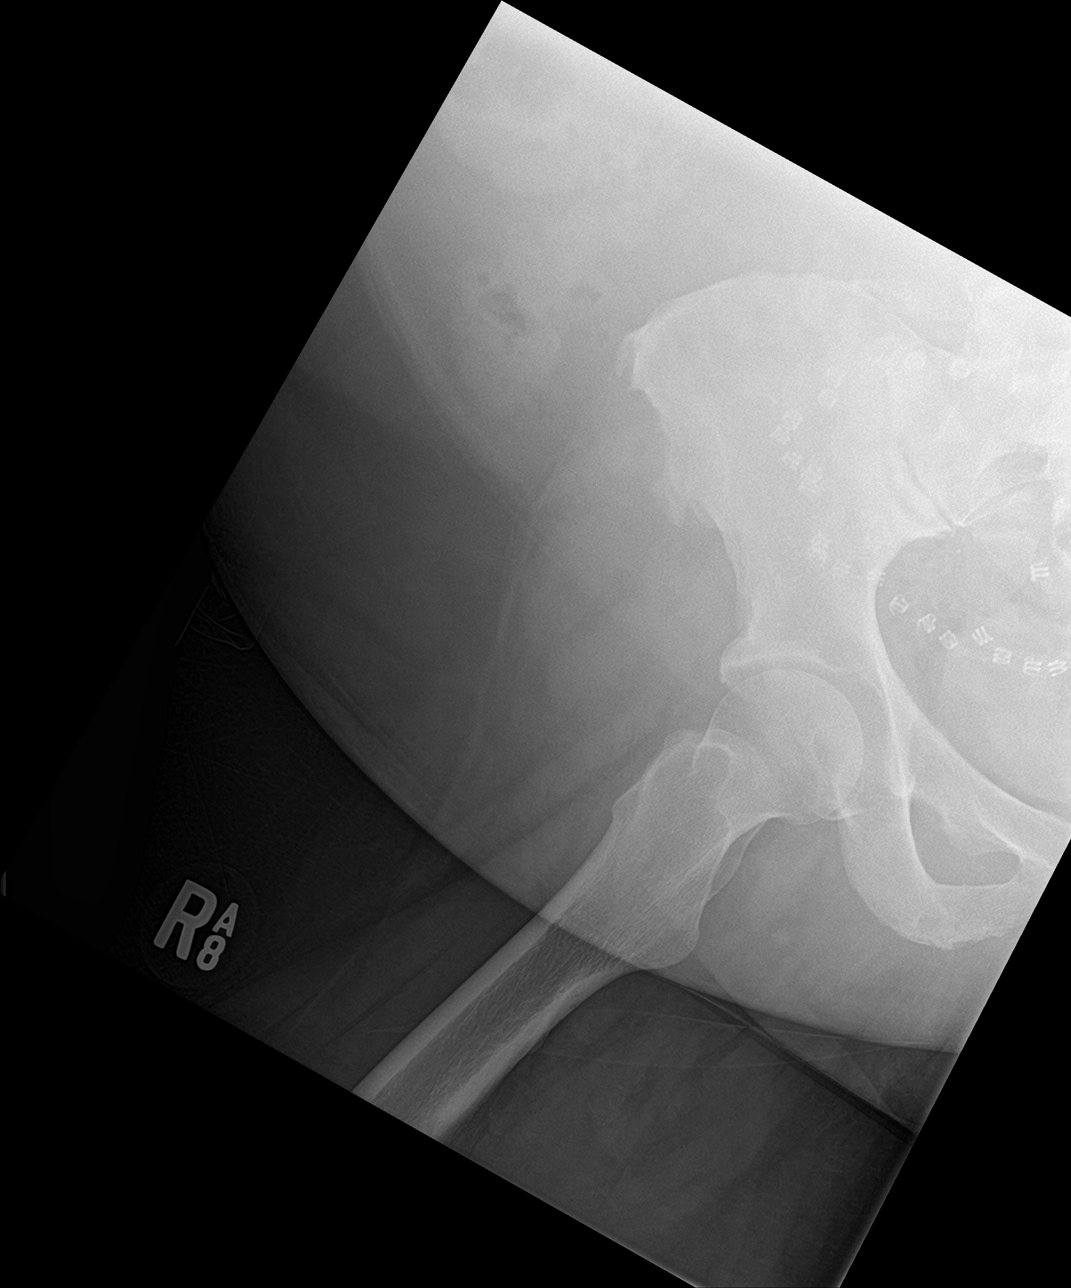

[hip ap (2 of 2)]
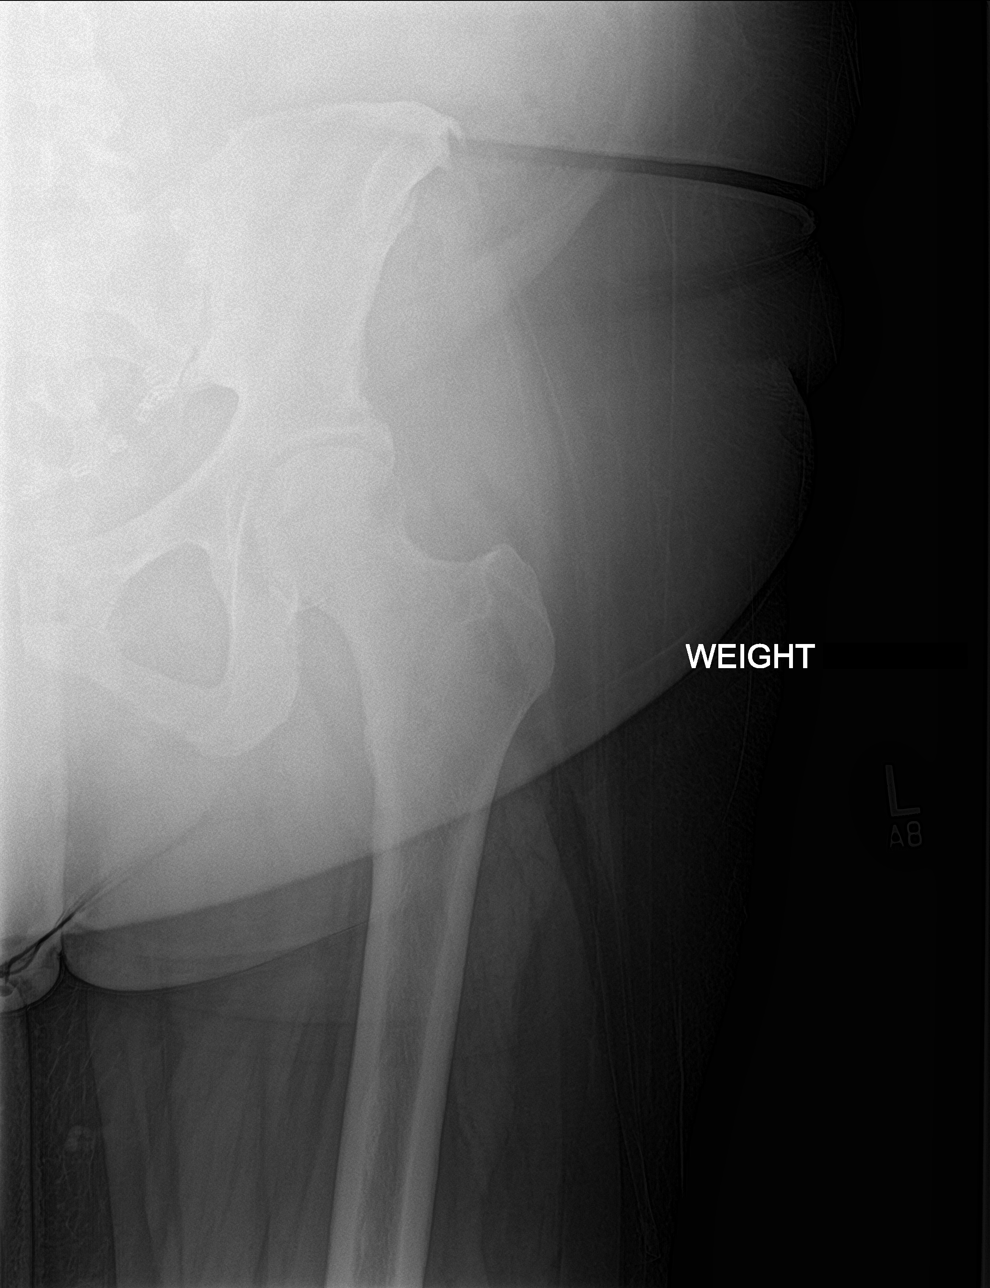

[hip lat (2 of 2)]
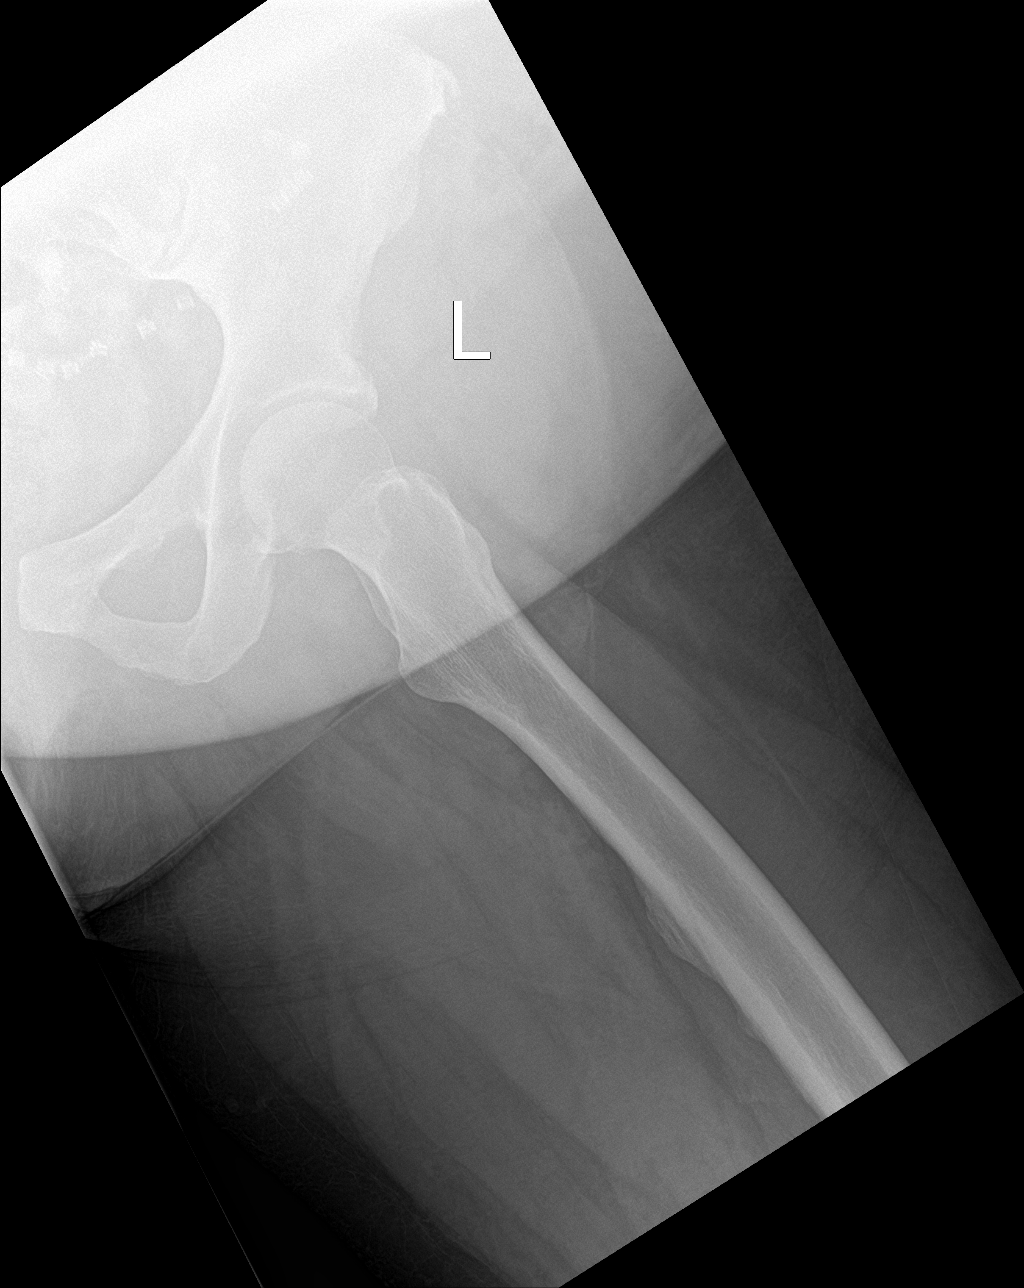

[4 of 4 positions shown; findings below may reference images not displayed]

FINDINGS: Osseous mineralization grossly normal.

Joint spaces preserved.

No acute fracture, dislocation, or bone destruction.

Visualized pelvis intact.

Multiple coils at the pelvis question prior ventral herniorrhaphy.
IMPRESSION: No acute hip abnormalities.

## 2019-01-12 ENCOUNTER — Telehealth: Payer: Self-pay | Admitting: Emergency Medicine

## 2019-01-12 ENCOUNTER — Encounter (INDEPENDENT_AMBULATORY_CARE_PROVIDER_SITE_OTHER): Payer: Self-pay | Admitting: Orthopaedic Surgery

## 2019-01-12 MED ORDER — BUPROPION HCL ER (SR) 150 MG PO TB12: 150.0000 mg | ORAL_TABLET | Freq: Two times a day (BID) | ORAL | 0 refills | Status: DC

## 2019-01-12 NOTE — Telephone Encounter (Signed)
Spoke with pt over the phone due to the nature of her message. I offered pt an appointment for today or tomorrow and she wants to see RB only. Pt has been scheduled to see RB on 01/16/2019 at 11:15am. Nothing further was needed.

## 2019-01-12 NOTE — Telephone Encounter (Signed)
It looks like it has been some time since Dr. Lamonte Sakai has prescribed this medication and was not the last provider to fill this I have asked her to call that provider to fill this medication.

## 2019-01-12 NOTE — Telephone Encounter (Signed)
Refill sent into Pharmacy per [facility] for a 90day supply.

## 2019-01-12 NOTE — Telephone Encounter (Signed)
Think oit would be worthwhile to have another injection-please call pt and schedule

## 2019-01-13 ENCOUNTER — Other Ambulatory Visit (INDEPENDENT_AMBULATORY_CARE_PROVIDER_SITE_OTHER): Payer: Self-pay | Admitting: Orthopaedic Surgery

## 2019-01-13 DIAGNOSIS — M545 Low back pain, unspecified: Secondary | ICD-10-CM

## 2019-01-13 DIAGNOSIS — G8929 Other chronic pain: Secondary | ICD-10-CM

## 2019-01-16 ENCOUNTER — Ambulatory Visit (INDEPENDENT_AMBULATORY_CARE_PROVIDER_SITE_OTHER): Payer: Managed Care, Other (non HMO) | Admitting: Emergency Medicine

## 2019-01-16 ENCOUNTER — Encounter: Payer: Self-pay | Admitting: Emergency Medicine

## 2019-01-16 DIAGNOSIS — J84115 Respiratory bronchiolitis interstitial lung disease: Secondary | ICD-10-CM

## 2019-01-16 DIAGNOSIS — J449 Chronic obstructive pulmonary disease, unspecified: Secondary | ICD-10-CM | POA: Diagnosis not present

## 2019-01-16 DIAGNOSIS — F17200 Nicotine dependence, unspecified, uncomplicated: Secondary | ICD-10-CM

## 2019-01-16 DIAGNOSIS — G4730 Sleep apnea, unspecified: Secondary | ICD-10-CM | POA: Diagnosis not present

## 2019-01-16 DIAGNOSIS — F17210 Nicotine dependence, cigarettes, uncomplicated: Secondary | ICD-10-CM | POA: Diagnosis not present

## 2019-01-16 MED ORDER — PREDNISONE 10 MG PO TABS: ORAL_TABLET | ORAL | 0 refills | Status: DC

## 2019-01-16 NOTE — Assessment & Plan Note (Signed)
She has flaring cough in the aftermath of recent upper respiratory infection.  Treated with a short course of steroids and antibiotics.  The cough is continued.  Certainly her other contributors are also present including increased cigarettes, allergy symptoms, GERD.  I think is reasonable to treat her again with a more sustained course of prednisone.  We need to try and control her exacerbating factors.  I will increase her Protonix to twice daily for 2 weeks, add back loratadine.  Discussed smoking cessation and decreasing in detail with her today.  We will refill her albuterol

## 2019-01-16 NOTE — Progress Notes (Signed)
Subjective:    Patient ID: Lisa [name], female    DOB: 1961/09/24, 58 y.o.   MRN: [id number]  Shortness of Breath  Pertinent negatives include no ear pain, fever, headaches, leg swelling, rash, rhinorrhea, sore throat, vomiting or wheezing.   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  Her most recent scan was 09/2017, prompted bronchoscopy in 10/2017 that was negative for malignancy, culture negative. She also has OSA on CPAP, great compliance. She follows with Dr Fuller Plan for GERD and IBS. She is feeling fairly well, less limited by her breathing. Continues to work. Has albuterol available, rarely uses it. Running out of bupropion.    She has cut her cigarettes to 1/2 pk a day. Having trouble getting her CPAP supplies from [place], unclear why.   ROV 11/18/18 --Ms. Greenly is 83, active smoker with a history of abnormal CT consistent with RB-ILD.  She also has documented COPD, obstructive sleep apnea on CPAP. She has cut down to 8 cigarettes daily. She has albuterol, never needs it. Good compliance with CPAP, good clinical response, more energy and less daytime sleepiness.  Checked autoimmune labs last time which were all negative with the exception of a mildly positive ANA, cytoplasmic dil 1:80.   She had CT chest 11/02/18 that I reviewed.  This shows some very subtle patchy groundglass versus air trapping, probably groundglass given her previous scans and history.  Appears to be slightly less noticeable than in 2017 but clearly still present.  Some scattered 4 mm or smaller pulmonary nodules are stable in size, deemed benign.  ROV 01/16/19 --58 year old woman, history of tobacco and RB ILD with associated COPD that we have followed with serial imaging and pulmonary function testing.  She also has obstructive sleep apnea and uses CPAP.  Most recent imaging  was 11/02/2018, showed similar areas of groundglass opacity, stable pulmonary nodules (now deemed benign), possible PAH. Since last time she had a URI, developed cough, was treated by her PCP with azithro and then prednisone. Little change. Her tobacco is now back up to 20 cig a day. She remains on buproprion. She is on protonix qd. She is coughing up clear thick mucous. Some mild allergy sx. She ran out her albuterol a few months ago.   Review of Systems  Constitutional: Negative for fever and unexpected weight change.  HENT: Negative for congestion, dental problem, ear pain, nosebleeds, postnasal drip, rhinorrhea, sinus pressure, sneezing, sore throat and trouble swallowing.   Eyes: Negative for redness and itching.  Respiratory: Positive for cough. Negative for chest tightness, shortness of breath and wheezing.   Cardiovascular: Negative for palpitations and leg swelling.  Gastrointestinal: Negative for nausea and vomiting.  Genitourinary: Negative for dysuria.  Musculoskeletal: Negative for joint swelling.  Skin: Negative for rash.  Neurological: Negative for headaches.  Hematological: Does not bruise/bleed easily.  Psychiatric/Behavioral: Negative for dysphoric mood. The patient is not nervous/anxious.        Objective:   Physical Exam Vitals:   01/16/19 1101  BP: 136/60  Pulse: 84  SpO2: 98%  Weight: 268 lb 3.2 oz (121.7 kg)  Height: 5' 5.75" (1.67 m)   Gen: Pleasant, obese, in no distress,  normal affect  ENT: No lesions,  mouth clear, L ptosis  Neck: No JVD, no stridor  Lungs: No use of accessory muscles, clear without rales or  rhonchi  Cardiovascular: RRR, heart sounds normal, no murmur or gallops, trace peripheral edema  Musculoskeletal: No deformities, no cyanosis or clubbing  Neuro: alert, non focal  Skin: Warm, no lesions or rashes       Assessment & Plan:  COPD (chronic obstructive pulmonary disease) ([place]) She has flaring cough in the aftermath of recent  upper respiratory infection.  Treated with a short course of steroids and antibiotics.  The cough is continued.  Certainly her other contributors are also present including increased cigarettes, allergy symptoms, GERD.  I think is reasonable to treat her again with a more sustained course of prednisone.  We need to try and control her exacerbating factors.  I will increase her Protonix to twice daily for 2 weeks, add back loratadine.  Discussed smoking cessation and decreasing in detail with her today.  We will refill her albuterol  Sleep apnea Continue CPAP every night.  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  She understands but cessation is been quite difficult, anxiety has been a large burden.  Tobacco use disorder We refilled her bupropion.  I talked to her about cessation strategies today.  Baltazar Apo, MD, PhD 01/16/2019, 12:49 PM [facility] [phone] or if no answer [phone]

## 2019-01-16 NOTE — Assessment & Plan Note (Signed)
Again tried underscore the importance of the tobacco use to this finding.  She understands but cessation is been quite difficult, anxiety has been a large burden.

## 2019-01-16 NOTE — Patient Instructions (Addendum)
Please take prednisone as directed until completely gone.  We will temporarily increase your protonix to 40mg  twice a day for 2 weeks. Then go back to once a day.  Try starting loratadine 10mg  once daily.  We will refill your albuterol. Use 2 puffs up to every 4 hours if you need it for shortness of breath CXR today You need to work hard on decreasing your cigarettes. They are contributing to your problems.  Continue the buproprion as you are taking them .  Please continue your CPAP every night.  Follow with Dr Lamonte Sakai in 3 months or sooner if you have any problems.

## 2019-01-16 NOTE — Assessment & Plan Note (Signed)
Continue CPAP every night.  Good compliance and good clinical benefit documented today

## 2019-01-16 NOTE — Assessment & Plan Note (Signed)
We refilled her bupropion.  I talked to her about cessation strategies today.

## 2019-01-23 ENCOUNTER — Ambulatory Visit
Admission: RE | Admit: 2019-01-23 | Discharge: 2019-01-23 | Disposition: A | Discharge status: Home / Self Care | Payer: Managed Care, Other (non HMO) | Source: Ambulatory Visit | Attending: Orthopaedic Surgery | Admitting: Orthopaedic Surgery

## 2019-01-23 DIAGNOSIS — M545 Low back pain, unspecified: Secondary | ICD-10-CM

## 2019-01-23 DIAGNOSIS — G8929 Other chronic pain: Secondary | ICD-10-CM

## 2019-01-23 MED ORDER — METHYLPREDNISOLONE ACETATE 40 MG/ML INJ SUSP (RADIOLOG
120.0000 mg | Freq: Once | INTRAMUSCULAR | Status: AC
  Administered 2019-01-23: 120 mg via EPIDURAL

## 2019-01-23 MED ORDER — IOPAMIDOL (ISOVUE-M 200) INJECTION 41%
1.0000 mL | Freq: Once | INTRAMUSCULAR | Status: AC
  Administered 2019-01-23: 1 mL via EPIDURAL

## 2019-01-23 NOTE — Discharge Instructions (Signed)
Spinal Injection Discharge Instruction Sheet  1. You may resume a regular diet and any medications that you routinely take, including pain medications.  2. No driving the rest of the day of the procedure.  3. Light activity throughout the rest of the day.  Do not do any strenuous work, exercise, bending or lifting.  This includes no laundry, no cooking, no cleaning, no dog or children duties. The day following the procedure, you may resume normal physical activity but you should refrain from exercising or physical therapy for at least three days.   Common Side Effects:   Headaches- take your usual medications as directed by your physician.     Restlessness or inability to sleep- you may have trouble sleeping for the next few days.  Ask your referring physician if you need any medication for sleep if over the counter sleep medications do not help.   Facial flushing or redness- this should subside within a few days.   Increased pain- a temporary increase in pain a day or two following your procedure is not unusual.  Take your pain medication as prescribed by your referring physician.  You may use ice to the injection site as needed.  Please do not use heat for 24 hours.   Leg cramps  Please contact our office at [phone] for the following symptoms:  Fever greater than 100 degrees.  Headaches unresolved with medication after 2-3 days.  Increased swelling, pain, or redness at injection site.  Thank you for visiting our office.

## 2019-03-11 ENCOUNTER — Other Ambulatory Visit: Payer: Self-pay | Admitting: Family Medicine

## 2019-03-16 ENCOUNTER — Encounter (INDEPENDENT_AMBULATORY_CARE_PROVIDER_SITE_OTHER): Payer: Self-pay | Admitting: Orthopaedic Surgery

## 2019-03-17 ENCOUNTER — Other Ambulatory Visit: Payer: Self-pay | Admitting: *Deleted

## 2019-03-17 DIAGNOSIS — M4807 Spinal stenosis, lumbosacral region: Secondary | ICD-10-CM

## 2019-03-17 NOTE — Progress Notes (Signed)
Mr lum

## 2019-03-20 ENCOUNTER — Telehealth: Payer: Managed Care, Other (non HMO) | Admitting: Internal Medicine

## 2019-03-20 DIAGNOSIS — H539 Unspecified visual disturbance: Secondary | ICD-10-CM

## 2019-03-20 DIAGNOSIS — R51 Headache: Principal | ICD-10-CM

## 2019-03-20 DIAGNOSIS — R519 Headache, unspecified: Secondary | ICD-10-CM

## 2019-03-20 DIAGNOSIS — R059 Cough, unspecified: Secondary | ICD-10-CM

## 2019-03-20 DIAGNOSIS — R05 Cough: Secondary | ICD-10-CM

## 2019-03-20 NOTE — Progress Notes (Signed)
Based on what you shared with me, I feel your condition warrants further evaluation and I recommend that you be seen for a face to face office visit. You dont have typical symptoms of influenza which cause, lots of runny nose, cough, sore throat and all over body aches with fever of 100.4 or more. Since you dont have shortness of breath with the cough, I also dont suspect corona virus by what you are describing. Sounds like you have recovered from a stomach virus and may need to keep on hydrating, but the fact you have a headaches that is not responding to medications you have taken and have had some vision changes and tingly on your lips, you need to be examined.      NOTE: If you entered your credit card information for this eVisit, you will not be charged. You may see a "hold" on your card for the $35 but that hold will drop off and you will not have a charge processed.  If you are having a true medical emergency please call 911.  If you need an urgent face to face visit, [place] has four urgent care centers for your convenience.    PLEASE NOTE: THE [facility] LOCATIONS AND URGENT CARE CLINICS DO NOT HAVE THE TESTING FOR CORONAVIRUS COVID19 AVAILABLE.  IF YOU FEEL YOU NEED THIS TEST YOU MUST GO TO A TRIAGE LOCATION AT [place]   [email or web address] to reserve your spot online an avoid wait times  [facility] [address], [address] [place], [place] [address] Modified hours of operation: Monday-Friday, 10 AM to 6 PM  Saturday & Sunday 10 AM to 4 PM *Across the street from [place] (New Address!) [address] [place], [place] [address] *Just off [address], across the road from [place] hours of operation: Monday-Friday, 10 AM to 5 PM  Closed Saturday & Sunday   The following sites will take your insurance:  . [facility] [place] a Provider at this Location  [address] [place], [place] [address] . 10 am to 8 pm Monday-Friday . 12 pm to 8 pm Saturday-Sunday   . [facility] [place] a Provider at this Location  [address], [place], [place] [address] . 8 am to 8 pm Monday-Friday . 9 am to 6 pm Saturday . 11 am to 6 pm Sunday   . [facility] [place] Get Driving Directions  [address] Suite [place], [place] [address] . 8 am to 8 pm Monday-Friday . 8 am to 4 pm Saturday-Sunday   Your e-visit answers were reviewed by a board certified advanced clinical practitioner to complete your personal care plan.  Thank you for using e-Visits.

## 2019-03-24 ENCOUNTER — Encounter (INDEPENDENT_AMBULATORY_CARE_PROVIDER_SITE_OTHER): Payer: Self-pay | Admitting: Orthopaedic Surgery

## 2019-03-27 ENCOUNTER — Telehealth: Payer: Self-pay | Admitting: *Deleted

## 2019-03-27 NOTE — Telephone Encounter (Signed)
Authorization # for MRI [id number] until 09/18/2019. Aaron Edelman did a peer-to-peer. Thank you for your help!

## 2019-03-27 NOTE — Telephone Encounter (Signed)
Peer review was performed today and was approved for an MRI scan of the lumbar spine.  A

## 2019-03-28 ENCOUNTER — Encounter: Payer: Self-pay | Admitting: Family Medicine

## 2019-03-28 ENCOUNTER — Ambulatory Visit
Admission: RE | Admit: 2019-03-28 | Discharge: 2019-03-28 | Disposition: A | Discharge status: Home / Self Care | Payer: Managed Care, Other (non HMO) | Source: Ambulatory Visit | Attending: Orthopaedic Surgery | Admitting: Orthopaedic Surgery

## 2019-03-28 ENCOUNTER — Other Ambulatory Visit: Payer: Self-pay

## 2019-03-28 ENCOUNTER — Ambulatory Visit (INDEPENDENT_AMBULATORY_CARE_PROVIDER_SITE_OTHER): Payer: Managed Care, Other (non HMO) | Admitting: Family Medicine

## 2019-03-28 VITALS — BP 121/82 | HR 81 | Temp 96.2°F | Ht 65.75 in

## 2019-03-28 DIAGNOSIS — M4807 Spinal stenosis, lumbosacral region: Secondary | ICD-10-CM

## 2019-03-28 DIAGNOSIS — R300 Dysuria: Secondary | ICD-10-CM

## 2019-03-28 DIAGNOSIS — R319 Hematuria, unspecified: Secondary | ICD-10-CM

## 2019-03-28 LAB — POC URINALSYSI DIPSTICK (AUTOMATED)
Bilirubin, UA: NEGATIVE
Blood, UA: POSITIVE
Glucose, UA: NEGATIVE
Ketones, UA: NEGATIVE
Nitrite, UA: POSITIVE
Protein, UA: POSITIVE — AB
Spec Grav, UA: 1.025 (ref 1.010–1.025)
Urobilinogen, UA: 1 E.U./dL
pH, UA: 6 (ref 5.0–8.0)

## 2019-03-28 MED ORDER — CEPHALEXIN 500 MG PO CAPS: 500.0000 mg | ORAL_CAPSULE | Freq: Three times a day (TID) | ORAL | 0 refills | Status: AC

## 2019-03-28 NOTE — Progress Notes (Signed)
Phone [phone]   Subjective:  Virtual visit via Video note. Chief complaint: Chief Complaint  Patient presents with  . Pain with unination    x 1day   This visit type was conducted due to national recommendations for restrictions regarding the COVID-19 Pandemic (e.g. social distancing).  This format is felt to be most appropriate for this patient at this time balancing risks to patient and risks to population by having him in for in person visit.  No physical exam was performed (except for noted visual exam or audio findings with Telehealth visits).    Our team/I connected with Lisa [name] on 03/28/19 at 10:40 AM EDT by a video enabled telemedicine application ([email or web address]) and verified that I am speaking with the correct person using two identifiers.  Location patient: [facility] Location provider: [facility], office Persons participating in the virtual visit:  patient  Our team/I discussed the limitations of evaluation and management by telemedicine and the availability of in person appointments. In light of current covid-19 pandemic, patient also understands that we are trying to protect them by minimizing in office contact if at all possible.  The patient expressed consent for telemedicine visit and agreed to proceed. Patient understands insurance will be billed.   ROS-see ROS below  Past Medical History-  Patient Active Problem List   Diagnosis Date Noted  . Type 2 diabetes mellitus ([place]) 03/11/2017    Priority: High  . PAD (peripheral artery disease) ([place]) 01/19/2017    Priority: High  . 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 Morbid obesity ([place]) 02/14/2016    Priority: High  . Tobacco use disorder 08/12/2015    Priority: High  . COPD (chronic obstructive pulmonary disease) ([place]) 01/18/2015    Priority: High  . Hyperlipidemia 04/12/2018    Priority: Medium  . Breast mass 08/03/2017    Priority: Medium  .  GERD (gastroesophageal reflux disease) 02/14/2016    Priority: Medium  . DOE (dyspnea on exertion) 03/08/2014    Priority: Medium  . Migraine without aura 07/26/2013    Priority: Medium  . Sleep apnea 02/11/2012    Priority: Medium  . Chest discomfort 10/12/2011    Priority: Medium  . Hypertension associated with diabetes ([place]) 10/12/2011    Priority: Medium  . Chronic hip pain 07/29/2017    Priority: Low  . Left knee pain 05/15/2016    Priority: Low  . Flank pain 04/21/2016    Priority: Low  . Abdominal pain 02/28/2016    Priority: Low  . Pulmonary nodule, right 04/11/2015    Priority: Low  . Mediastinal lymphadenopathy 01/18/2015    Priority: Low  . Bilateral leg pain 04/14/2018  . Essential hypertension 12/20/2017  . Pulmonary infiltrates   . S/P bronchoscopy with biopsy   . Hyperlipidemia associated with type 2 diabetes mellitus ([place]) 09/06/2017    Medications- reviewed and updated Current Outpatient Medications  Medication Sig Dispense Refill  . albuterol (PROVENTIL HFA;VENTOLIN HFA) 108 (90 Base) MCG/ACT inhaler Inhale 2 puffs into the lungs every 6 (six) hours as needed for wheezing or shortness of breath. 1 Inhaler 3  . aspirin EC 81 MG tablet Take 81 mg by mouth daily.    Marland Kitchen atorvastatin (LIPITOR) 40 MG tablet TAKE 1 TABLET (40 MG TOTAL) BY MOUTH DAILY. 90 tablet 3  . buPROPion (WELLBUTRIN SR) 150 MG 12 hr tablet Take 150 mg by mouth in the morning  0  . buPROPion [facility] SR) 150  MG 12 hr tablet Take 1 tablet (150 mg total) by mouth 2 (two) times daily. 180 tablet 0  . carvedilol (COREG) 25 MG tablet TAKE 1 TABLET BY MOUTH 2 TIMES DAILY 60 tablet 2  . dicyclomine (BENTYL) 10 MG capsule Take 1 capsule (10 mg total) by mouth 3 (three) times daily before meals. As needed 90 capsule 11  . losartan (COZAAR) 100 MG tablet TAKE 1 TABLET BY MOUTH EVERY DAY 90 tablet 1  . ONE TOUCH LANCETS MISC Use to check blood sugars twice a day 200 each 0  . OZEMPIC, 0.25 OR 0.5  MG/DOSE, 2 MG/1.5ML SOPN INJECT 0.5 MG INTO THE SKIN ONCE A WEEK. 3 pen 8  . pantoprazole (PROTONIX) 40 MG tablet Take 1 tablet (40 mg total) by mouth daily. 30 tablet 11  . predniSONE (DELTASONE) 10 MG tablet Take 4 tablets for 3 days, 3 tablets for 3 days, 2 tablets for 3 days, 1 tablet for 3 days 30 tablet 0  . spironolactone (ALDACTONE) 50 MG tablet Take 1 tablet (50 mg total) by mouth daily. 90 tablet 1  . traMADol (ULTRAM) 50 MG tablet Take 1 tablet (50 mg total) by mouth every 6 (six) hours as needed. 15 tablet 0  . cephALEXin (KEFLEX) 500 MG capsule Take 1 capsule (500 mg total) by mouth 3 (three) times daily for 7 days. 21 capsule 0   No current facility-administered medications for this visit.      Objective:  BP 121/82 (BP Location: Left Arm, Patient Position: Sitting, Cuff Size: Normal)   Pulse 81   Temp (!) 96.2 F (35.7 C) (Oral)   Ht 5' 5.75" (1.67 m)   BMI 43.62 kg/m  Gen: NAD, resting comfortably Lungs: nonlabored, normal respiratory rate  Skin: appears dry, no obvious rash Points to lower pelvic region as source of pain-denies abdominal pain.  Appears uncomfortable when getting up to show me areas that hurt- but she reports that is related to her back pain    Assessment and Plan   #Concern for UTI/urinary tract pain S: Patients symptoms started with symtpoms last night- nocturia and dysuria.  Complains of dysuria: yes; polyuria but drinking a lot of water: yes; nocturia: yes; urgency: yes.  Symptoms are worsening- states had significant pain when urinating last night- 6-7/10 burning pain. Urine appeared cloudy with possible "specks". Had continued vaginal burning but worse with peeing. No vaginal bleeding or discharge. States no vaginal bleeding- just from the urine. States was dehydrated over the weekend. Pain down to 4/10 at moment and then screams when goes to bathroom. More significant than UTI.   No obvious kidney stones on CT 09/2016  ROS- no fever, chills,  nausea, vomiting. Some right flank pain but not new. No blood in urine.  A/P: UA will be obtained later today. Likely UTI. Will get culture. Empiric treatment with: keflex. She has ceftin allergy but reported as diverticulitis flare up- may simply be coincidental but if she has worsening abodminal pain she will stop. I also wonder if she may have passed a kidney stone and had a small tear based on debris in toilet and prior blood. Would consider further workup along that path if UA and culture unremarkable.  - considered cipro but worried about tendinopathy issues, considered macrobid but worried about pulmonary issues. Might use bactrim if keflex not tolerated (but on epic- appeared not convered by insurance)  Patient to follow up if new or worsening symptoms or failure to improve.   She  is getting an MRI for her back and hip today through orthopedics- possible we could see stones there though better visualized on CT  No obvious COVID symptoms in regards to her coming by to drop off urine test. Future Appointments  Date Time Provider [place]  03/28/2019  2:20 PM GI-315 MR 3 GI-315MRI GI-[facility] [address] [facility]  03/30/2019 10:00 AM Garald Balding, MD PO-CAR None  04/17/2019 11:00 AM Marin Olp, MD [facility] PEC  05/30/2019  3:30 PM Huel Cote, NP GGA-GGA Mariane Baumgarten  06/23/2019 11:15 AM Byrum, Rose Fillers, MD L[facility]-PULCARE None   Lab/Order associations: Dysuria - Plan: POCT Urinalysis Dipstick (Automated), Urine Culture  Meds ordered this encounter  Medications  . cephALEXin (KEFLEX) 500 MG capsule    Sig: Take 1 capsule (500 mg total) by mouth 3 (three) times daily for 7 days.    Dispense:  21 capsule    Refill:  0   \ Return precautions advised.  Garret Reddish, MD

## 2019-03-28 NOTE — Progress Notes (Signed)
Order in, pt aware to come in 1 month,  Free of Covid-19 symptoms.

## 2019-03-28 NOTE — Telephone Encounter (Signed)
Noted, and pt scheduled

## 2019-03-28 NOTE — Telephone Encounter (Signed)
Thanks

## 2019-03-28 NOTE — Addendum Note (Signed)
Addended by: Francis Dowse T on: 03/28/2019 12:09 PM   Modules accepted: Orders

## 2019-03-28 NOTE — Patient Instructions (Signed)
There are no preventive care reminders to display for this patient.  Depression screen [facility] 2/9 12/20/2018 06/21/2018 04/22/2017  Decreased Interest 0 0 1  Down, Depressed, Hopeless 1 0 1  PHQ - 2 Score 1 0 2  Altered sleeping - - 0  Tired, decreased energy - - 3  Change in appetite - - 0  Feeling bad or failure about yourself  - - 1  Trouble concentrating - - 0  Moving slowly or fidgety/restless - - 0  PHQ-9 Score - - 6  Some recent data might be hidden   Video visit

## 2019-03-30 ENCOUNTER — Encounter (INDEPENDENT_AMBULATORY_CARE_PROVIDER_SITE_OTHER): Payer: Self-pay | Admitting: Orthopaedic Surgery

## 2019-03-30 ENCOUNTER — Ambulatory Visit (INDEPENDENT_AMBULATORY_CARE_PROVIDER_SITE_OTHER): Payer: Managed Care, Other (non HMO) | Admitting: Orthopaedic Surgery

## 2019-03-30 ENCOUNTER — Other Ambulatory Visit: Payer: Self-pay

## 2019-03-30 VITALS — BP 129/80 | HR 78 | Ht 66.0 in | Wt 268.0 lb

## 2019-03-30 DIAGNOSIS — M47816 Spondylosis without myelopathy or radiculopathy, lumbar region: Secondary | ICD-10-CM

## 2019-03-30 LAB — URINE CULTURE
MICRO NUMBER:: 428246
SPECIMEN QUALITY:: ADEQUATE

## 2019-03-30 MED ORDER — DICLOFENAC SODIUM 1 % TD GEL: 2.0000 g | Freq: Three times a day (TID) | TRANSDERMAL | 1 refills | Status: DC

## 2019-03-30 NOTE — Addendum Note (Signed)
Addended by: Lendon Collar on: 03/30/2019 12:32 PM   Modules accepted: Orders

## 2019-03-30 NOTE — Progress Notes (Signed)
Office Visit Note   Patient: Lisa [name]           Date of Birth: 05/11/1961           MRN: [id number] Visit Date: 03/30/2019              Requested by: Marin Olp, MD [place], [place] [address] PCP: Marin Olp, MD   Assessment & Plan: Visit Diagnoses:  1. Spondylosis without myelopathy or radiculopathy, lumbar region     Plan:  #1: Physical therapy referral for back exercise program #2: Voltaren gel #3: Epidural by Dr. Jobe Igo at [facility].  Follow-Up Instructions: No follow-ups on file.   Face-to-face time spent with patient was greater than 30 minutes.  Greater than 50% of the time was spent in counseling and coordination of care.  Orders:  No orders of the defined types were placed in this encounter.  No orders of the defined types were placed in this encounter.     Procedures: No procedures performed   Clinical Data: No additional findings.   Subjective: Chief Complaint  Patient presents with  . Lower Back - Follow-up  HPI: Patient presents today for follow up on her lower back that has been present for years. She had an MRI on 03/28/19 and is here for results. She is taking Motrin as needed.  Soley is seen today for evaluation of back pain and leg pain.  She has had previous MRI in April 2018 which revealed:  L4-5: Mild disc bulge. Bilateral facet osteoarthritis with mild hypertrophy. Mild stenosis of the lateral recesses left more than right. This would have some potential to affect the L5 nerve roots, particularly the left. The facet arthropathy could also be a cause of back pain or referred facet syndrome.  L5-S1 mild facet osteoarthritis left more than right. No stenosis. The facet arthritis could contribute to low back pain or referred facet syndrome pain.  She has had an epidural placed on the left by Dr. Jobe Igo on 10/19/2018.  She states she had excellent results with no pain for 3 months.  She  then began to have pain again and had a epidural on the right Dr. Jeralyn Ruths on 01/23/2019.  That this was completely different than the one placed by Dr. Jobe Igo.  He had no relief even from the day that it was placed.  She continues to have pain discomfort.  And therefore a repeat MRI scan was obtained.  Seen today for review of MRI scan.   Review of Systems  Constitutional: Positive for fatigue.  HENT: Negative for ear pain.   Eyes: Positive for pain.  Respiratory: Negative for shortness of breath.   Cardiovascular: Positive for leg swelling.  Gastrointestinal: Negative for constipation and diarrhea.  Endocrine: Negative for cold intolerance and heat intolerance.  Genitourinary: Positive for difficulty urinating.  Musculoskeletal: Negative for joint swelling.  Skin: Negative for rash.  Allergic/Immunologic: Negative for food allergies.  Neurological: Negative for weakness.  Hematological: Does not bruise/bleed easily.  Psychiatric/Behavioral: Negative for sleep disturbance.     Objective: Vital Signs: BP 129/80   Pulse 78   Ht 5\' 6"  (1.676 m)   Wt 268 lb (121.6 kg)   BMI 43.26 kg/m   Physical Exam Constitutional:      Appearance: Normal appearance. She is well-developed. She is obese.  HENT:     Head: Normocephalic.  Eyes:     Pupils: Pupils are equal, round, and reactive to light.  Pulmonary:     Effort: Pulmonary effort is normal.  Skin:    General: Skin is warm and dry.  Neurological:     Mental Status: She is alert and oriented to person, place, and time.  Psychiatric:        Behavior: Behavior normal.     Ortho Exam  Exam today reveals negative straight leg raising bilaterally.  Good motion of both hips with no real pain noted.  Deep tendon reflexes were absent in both extremities bilateral and symmetric.  Calves are supple and nontender.   Specialty Comments:  No specialty comments available.  Imaging: Mr Lumbar Spine Wo Contrast  Result Date: 03/28/2019  CLINICAL DATA:  Chronic low back pain radiating into both hips with bilateral leg numbness since October 2019. EXAM: MRI LUMBAR SPINE WITHOUT CONTRAST TECHNIQUE: Multiplanar, multisequence MR imaging of the lumbar spine was performed. No intravenous contrast was administered. COMPARISON:  Lumbar spine x-rays dated October 07, 2018. MRI lumbar spine dated March 25, 2017. FINDINGS: Segmentation:  Standard. Alignment:  Physiologic. Vertebrae: Mildly decreased T2 and T1 marrow signal is unchanged. Unchanged L4 hemangioma. No fracture, evidence of discitis, or suspicious bone lesion. Conus medullaris and cauda equina: Conus extends to the T12-L1 level. Conus and cauda equina appear normal. Paraspinal and other soft tissues: Negative. Disc levels: T12-L1 to L3-L4:  Negative. L4-L5: Unchanged mild disc bulging and bilateral facet arthropathy. Unchanged borderline mild bilateral lateral recess stenosis. No spinal canal or neuroforaminal stenosis. L5-S1: Negative disc. Unchanged mild bilateral facet arthropathy. No stenosis. IMPRESSION: 1. Similar appearing mild lumbar spondylosis at L4-L5 and L5-S1 without significant stenosis or impingement. Electronically Signed   By: Titus Dubin M.D.   On: 03/28/2019 15:06     PMFS History: Current Outpatient Medications  Medication Sig Dispense Refill  . albuterol (PROVENTIL HFA;VENTOLIN HFA) 108 (90 Base) MCG/ACT inhaler Inhale 2 puffs into the lungs every 6 (six) hours as needed for wheezing or shortness of breath. 1 Inhaler 3  . aspirin EC 81 MG tablet Take 81 mg by mouth daily.    Marland Kitchen atorvastatin (LIPITOR) 40 MG tablet TAKE 1 TABLET (40 MG TOTAL) BY MOUTH DAILY. 90 tablet 3  . buPROPion (WELLBUTRIN SR) 150 MG 12 hr tablet Take 150 mg by mouth in the morning  0  . buPROPion (WELLBUTRIN SR) 150 MG 12 hr tablet Take 1 tablet (150 mg total) by mouth 2 (two) times daily. 180 tablet 0  . carvedilol (COREG) 25 MG tablet TAKE 1 TABLET BY MOUTH 2 TIMES DAILY 60 tablet 2  .  cephALEXin (KEFLEX) 500 MG capsule Take 1 capsule (500 mg total) by mouth 3 (three) times daily for 7 days. 21 capsule 0  . dicyclomine (BENTYL) 10 MG capsule Take 1 capsule (10 mg total) by mouth 3 (three) times daily before meals. As needed 90 capsule 11  . losartan (COZAAR) 100 MG tablet TAKE 1 TABLET BY MOUTH EVERY DAY 90 tablet 1  . ONE TOUCH LANCETS MISC Use to check blood sugars twice a day 200 each 0  . OZEMPIC, 0.25 OR 0.5 MG/DOSE, 2 MG/1.5ML SOPN INJECT 0.5 MG INTO THE SKIN ONCE A WEEK. 3 pen 8  . pantoprazole (PROTONIX) 40 MG tablet Take 1 tablet (40 mg total) by mouth daily. 30 tablet 11  . predniSONE (DELTASONE) 10 MG tablet Take 4 tablets for 3 days, 3 tablets for 3 days, 2 tablets for 3 days, 1 tablet for 3 days 30 tablet 0  . spironolactone (ALDACTONE) 50 MG  tablet Take 1 tablet (50 mg total) by mouth daily. 90 tablet 1  . traMADol (ULTRAM) 50 MG tablet Take 1 tablet (50 mg total) by mouth every 6 (six) hours as needed. 15 tablet 0   No current facility-administered medications for this visit.     Patient Active Problem List   Diagnosis Date Noted  . Bilateral leg pain 04/14/2018  . Pulmonary infiltrates   . S/P bronchoscopy with biopsy   . Hyperlipidemia associated with type 2 diabetes mellitus ([place]) 09/06/2017  . Breast mass 08/03/2017  . Chronic hip pain 07/29/2017  . Type 2 diabetes mellitus ([place]) 03/11/2017  . PAD (peripheral artery disease) ([place]) 01/19/2017  . 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 Left knee pain 05/15/2016  . Flank pain 04/21/2016  . Abdominal pain 02/28/2016  . GERD (gastroesophageal reflux disease) 02/14/2016  . Morbid obesity ([place]) 02/14/2016  . Tobacco use disorder 08/12/2015  . Pulmonary nodule, right 04/11/2015  . COPD (chronic obstructive pulmonary disease) ([place]) 01/18/2015  . Mediastinal lymphadenopathy 01/18/2015  . DOE (dyspnea on exertion) 03/08/2014  . Migraine without aura 07/26/2013  .  Sleep apnea 02/11/2012  . Chest discomfort 10/12/2011  . Hypertension associated with diabetes ([place]) 10/12/2011   Past Medical History:  Diagnosis Date  . Allergic rhinitis   . Asthma   . Breast nodule    right breast, to see dr Helane Rima 06-20-2012 for   . Chest pain, non-cardiac   . Chronic headaches   . Cigarette smoker   . Complication of anesthesia 05-17-2012   trouble breathing after colonscopy, needed nebulizer  . Diverticulosis   . GERD (gastroesophageal reflux disease)   . Hepatic hemangioma   . Hypertension   . IBS (irritable bowel syndrome)   . Obesity   . Prosthetic eye globe   . Pyloric erosion   . Sleep apnea    CPAP setting varies from 4-10  . Type 2 diabetes mellitus ([place]) 03/11/2017    Family History  Problem Relation Age of Onset  . Hypertension Mother   . Lung cancer Mother        40  . Diabetes Maternal Grandmother   . Heart disease Father        pacemaker or defibrillator  . Heart attack Paternal Uncle   . Breast cancer Other   . Colon cancer Neg Hx     Past Surgical History:  Procedure Laterality Date  . ABDOMINAL AORTOGRAM W/LOWER EXTREMITY N/A 04/30/2017   Procedure: Abdominal Aortogram w/Lower Extremity;  Surgeon: Elam Dutch, MD;  [facility];  Service: Cardiovascular;  Laterality: N/A;  . CARDIAC CATHETERIZATION  12/28/2007   EF 75%. IT REVEALS NORMAL/SUPRANORMAL LEFT VENTRICULAR SYSTOLIC FUNCTION  . CARDIOVASCULAR STRESS TEST  03/25/2007   EF 78%  . CESAREAN SECTION  2003, 2009   x 2  . CHOLECYSTECTOMY  1993  . COLONOSCOPY  2014  . ENDOMETRIAL ABLATION  09/2010   and D&C  . HERNIA REPAIR  1610   umbilical  . LAPAROSCOPY    . PERIPHERAL VASCULAR BALLOON ANGIOPLASTY Left 04/30/2017   Procedure: Peripheral Vascular Balloon Angioplasty;  Surgeon: Elam Dutch, MD;  Location: [facility];  Service: Cardiovascular;  Laterality: Left;  SFA  . TUBAL LIGATION    . UMBILICAL HERNIA REPAIR  2004  . US ECHOCARDIOGRAPHY   05/08/2008   EF 55-60%  . VENTRAL HERNIA REPAIR  06/15/2012   Procedure: LAPAROSCOPIC VENTRAL HERNIA;  Surgeon: Edward Jolly,  MD;  Location: [facility];  Service: General;  Laterality: N/A;  LAPAROSCOPIC REPAIR VENTRAL HERNIA  . VIDEO BRONCHOSCOPY Bilateral 11/15/2017   Procedure: VIDEO BRONCHOSCOPY WITH FLUORO;  Surgeon: Collene Gobble, MD;  Location: [facility];  Service: Cardiopulmonary;  Laterality: Bilateral;   Social History   Occupational History  . Occupation: stay at home mom  . Occupation: Ship broker  Tobacco Use  . Smoking status: Current Every Day Smoker    Packs/day: 1.00    Years: 38.00    Pack years: 38.00    Types: Cigarettes  . Smokeless tobacco: Never Used  . Tobacco comment: 10 cigarettes per day  Substance and Sexual Activity  . Alcohol use: No    Alcohol/week: 0.0 standard drinks  . Drug use: No  . Sexual activity: Yes    Comment: intercourse age 44, less than 5 sexual partners,des neg

## 2019-04-04 ENCOUNTER — Other Ambulatory Visit: Payer: Self-pay | Admitting: *Deleted

## 2019-04-04 ENCOUNTER — Other Ambulatory Visit: Payer: Self-pay | Admitting: Cardiovascular Disease

## 2019-04-04 MED ORDER — BUPROPION HCL ER (SR) 150 MG PO TB12: 150.0000 mg | ORAL_TABLET | Freq: Two times a day (BID) | ORAL | 1 refills | Status: DC

## 2019-04-14 ENCOUNTER — Ambulatory Visit: Payer: Managed Care, Other (non HMO) | Admitting: Emergency Medicine

## 2019-04-17 ENCOUNTER — Ambulatory Visit (INDEPENDENT_AMBULATORY_CARE_PROVIDER_SITE_OTHER): Payer: Managed Care, Other (non HMO) | Admitting: Family Medicine

## 2019-04-17 ENCOUNTER — Encounter: Payer: Self-pay | Admitting: Family Medicine

## 2019-04-17 VITALS — BP 149/80 | HR 85 | Ht 66.0 in | Wt 268.0 lb

## 2019-04-17 DIAGNOSIS — I5030 Unspecified diastolic (congestive) heart failure: Secondary | ICD-10-CM

## 2019-04-17 DIAGNOSIS — E1159 Type 2 diabetes mellitus with other circulatory complications: Secondary | ICD-10-CM | POA: Diagnosis not present

## 2019-04-17 DIAGNOSIS — E1165 Type 2 diabetes mellitus with hyperglycemia: Secondary | ICD-10-CM | POA: Diagnosis not present

## 2019-04-17 DIAGNOSIS — I10 Essential (primary) hypertension: Secondary | ICD-10-CM

## 2019-04-17 DIAGNOSIS — E1169 Type 2 diabetes mellitus with other specified complication: Secondary | ICD-10-CM

## 2019-04-17 DIAGNOSIS — I739 Peripheral vascular disease, unspecified: Secondary | ICD-10-CM | POA: Diagnosis not present

## 2019-04-17 DIAGNOSIS — E785 Hyperlipidemia, unspecified: Secondary | ICD-10-CM

## 2019-04-17 DIAGNOSIS — I152 Hypertension secondary to endocrine disorders: Secondary | ICD-10-CM

## 2019-04-17 HISTORY — DX: Unspecified diastolic (congestive) heart failure: I50.30

## 2019-04-17 MED ORDER — TELMISARTAN 40 MG PO TABS: 40.0000 mg | ORAL_TABLET | Freq: Every day | ORAL | 2 refills | Status: DC

## 2019-04-17 MED ORDER — TELMISARTAN 80 MG PO TABS: 80.0000 mg | ORAL_TABLET | Freq: Every day | ORAL | 2 refills | Status: DC

## 2019-04-17 NOTE — Patient Instructions (Signed)
Health Maintenance Due  Topic Date Due  . FOOT EXAM  04/13/2019    Depression screen [facility] 2/9 12/20/2018 06/21/2018 04/22/2017  Decreased Interest 0 0 1  Down, Depressed, Hopeless 1 0 1  PHQ - 2 Score 1 0 2  Altered sleeping - - 0  Tired, decreased energy - - 3  Change in appetite - - 0  Feeling bad or failure about yourself  - - 1  Trouble concentrating - - 0  Moving slowly or fidgety/restless - - 0  PHQ-9 Score - - 6  Some recent data might be hidden

## 2019-04-17 NOTE — Assessment & Plan Note (Signed)
S:  Diffuse myalgias and arthralgias- arms, hands, back, legs, knees, shins. seems to be really bad in morning- seems to be better as she is more active. Feels liek pain gets worse when she takes her medications  A/P: myalgias could be statin related- only want to reduce short term but patient is going to try off statin for a week and if this improves symptoms restart every other day

## 2019-04-17 NOTE — Progress Notes (Signed)
Phone [phone]   Subjective:  Virtual visit via Video note. Chief complaint: Chief Complaint  Patient presents with  . Myalgias    arms,hands,legs   . Follow-up   This visit type was conducted due to national recommendations for restrictions regarding the COVID-19 Pandemic (e.g. social distancing).  This format is felt to be most appropriate for this patient at this time balancing risks to patient and risks to population by having him in for in person visit.  No physical exam was performed (except for noted visual exam or audio findings with Telehealth visits).    Our team/I connected with Lisa [name] at 11:00 AM EDT by a video enabled telemedicine application (doxy.me or caregility through epic) and verified that I am speaking with the correct person using two identifiers.  Location patient: Home[place] Location provider: [facility], office Persons participating in the virtual visit:  patient  Our team/I discussed the limitations of evaluation and management by telemedicine and the availability of in person appointments. In light of current covid-19 pandemic, patient also understands that we are trying to protect them by minimizing in office contact if at all possible.  The patient expressed consent for telemedicine visit and agreed to proceed. Patient understands insurance will be billed.   ROS- No fever, chills, cough, shortness of breath, new body aches (likely statin related myalgias), sore throat, or loss of taste or smell   Past Medical History-  Patient Active Problem List   Diagnosis Date Noted  . Type 2 diabetes mellitus ([place]) 03/11/2017    Priority: High  . PAD (peripheral artery disease) ([place]) 01/19/2017    Priority: High  . 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 Morbid obesity ([place]) 02/14/2016    Priority: High  . Tobacco use disorder 08/12/2015    Priority: High  . COPD (chronic obstructive pulmonary  disease) ([place]) 01/18/2015    Priority: High  . Hyperlipidemia associated with type 2 diabetes mellitus ([place]) 09/06/2017    Priority: Medium  . Breast mass 08/03/2017    Priority: Medium  . GERD (gastroesophageal reflux disease) 02/14/2016    Priority: Medium  . DOE (dyspnea on exertion) 03/08/2014    Priority: Medium  . Migraine without aura 07/26/2013    Priority: Medium  . Sleep apnea 02/11/2012    Priority: Medium  . Chest discomfort 10/12/2011    Priority: Medium  . Hypertension associated with diabetes ([place]) 10/12/2011    Priority: Medium  . Chronic hip pain 07/29/2017    Priority: Low  . Left knee pain 05/15/2016    Priority: Low  . Flank pain 04/21/2016    Priority: Low  . Abdominal pain 02/28/2016    Priority: Low  . Pulmonary nodule, right 04/11/2015    Priority: Low  . Mediastinal lymphadenopathy 01/18/2015    Priority: Low  . Diastolic congestive heart failure ([place]) 04/17/2019  . Bilateral leg pain 04/14/2018  . Pulmonary infiltrates   . S/P bronchoscopy with biopsy     Medications- reviewed and updated Current Outpatient Medications  Medication Sig Dispense Refill  . albuterol (PROVENTIL HFA;VENTOLIN HFA) 108 (90 Base) MCG/ACT inhaler Inhale 2 puffs into the lungs every 6 (six) hours as needed for wheezing or shortness of breath. 1 Inhaler 3  . aspirin EC 81 MG tablet Take 81 mg by mouth daily.    Marland Kitchen atorvastatin (LIPITOR) 40 MG tablet TAKE 1 TABLET (40 MG TOTAL) BY MOUTH DAILY. 90 tablet 3  . buPROPion (  WELLBUTRIN SR) 150 MG 12 hr tablet Take 1 tablet (150 mg total) by mouth 2 (two) times daily. 180 tablet 1  . carvedilol (COREG) 25 MG tablet TAKE 1 TABLET BY MOUTH TWICE A DAY 180 tablet 0  . diclofenac sodium (VOLTAREN) 1 % GEL Apply 2-4 g topically 3 (three) times daily. 5 Tube 1  . dicyclomine (BENTYL) 10 MG capsule Take 1 capsule (10 mg total) by mouth 3 (three) times daily before meals. As needed 90 capsule 11  . losartan (COZAAR) 100 MG tablet TAKE 1  TABLET BY MOUTH EVERY DAY 90 tablet 1  . ONE TOUCH LANCETS MISC Use to check blood sugars twice a day 200 each 0  . OZEMPIC, 0.25 OR 0.5 MG/DOSE, 2 MG/1.5ML SOPN INJECT 0.5 MG INTO THE SKIN ONCE A WEEK. 3 pen 8  . pantoprazole (PROTONIX) 40 MG tablet Take 1 tablet (40 mg total) by mouth daily. 30 tablet 11  . spironolactone (ALDACTONE) 50 MG tablet Take 1 tablet (50 mg total) by mouth daily. 90 tablet 1  . traMADol (ULTRAM) 50 MG tablet Take 1 tablet (50 mg total) by mouth every 6 (six) hours as needed. 15 tablet 0  . telmisartan (MICARDIS) 80 MG tablet Take 1 tablet (80 mg total) by mouth daily. UNTIL LOSARTAN IS OFF BACKORDER. Please disregard 40mg  dose sent in. 30 tablet 2   No current facility-administered medications for this visit.      Objective:  BP (!) 149/80   Pulse 85   Ht 5\' 6"  (1.676 m)   Wt 268 lb (121.6 kg)   BMI 43.26 kg/m  self reported vitals Gen: NAD, resting comfortably Lungs: nonlabored, normal respiratory rate  Skin: appears dry, no obvious rash    Assessment and Plan   # Diabetes S:  controlled on ozempic alone. Exercise limited- weight at least stable Lab Results  Component Value Date   HGBA1C 5.7 (A) 12/20/2018   HGBA1C 6.5 04/12/2018   HGBA1C 6.3 07/30/2017   A/P: hopefully stable- will come by for a1c  # PAD S: gets some cramping in legs/possible claudication with about a block of walking- if takes break gets better.  History of angioplasty of left superficial femoral artery stenosis June 2018. ABIs were excellent 07/2018 A/P: continue aspirin, statin, regular exercise. Appears stable. Needs to quit smoking as well  # CHF S:still with baseline SOB . Patient is compliant with spironolactone 50mg  daily. No recent weight gain.  A/P: Stable. Continue current medications.    # Hyperlipidemia/Myalgias S:  Diffuse myalgias and arthralgias- arms, hands, back, legs, knees, shins. seems to be really bad in morning- seems to be better as she is more active.  Feels liek pain gets worse when she takes her medications  A/P: myalgias could be statin related- only want to reduce short term but patient is going to try off statin for a week and if this improves symptoms restart every other day  #hypertension S: controlled in past- patient has run out of losartan 100mg  as on backorder and BP now elevated. She is on arb, spironolactone 50mg  and coreg- has not has potassium issues despite arb and spironolactone BP Readings from Last 3 Encounters:  04/17/19 (!) 149/80  03/30/19 129/80  03/28/19 121/82  A/P: poor control- advised her to start tellmisartan 80mg  until she can get losartan again- she agrees.   Other notes: 1.january was her last work- trying to get unemployment still since march 23rd 2. 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 Did at least qualify for pandemic related food stamps for her sons through school  3. Need to encourage patient to quit smoking at follow up- dd not touch base on that today  Future Appointments  Date Time Provider [place]  05/30/2019  3:30 PM Huel Cote, NP [facility] Mariane Baumgarten  06/23/2019 11:15 AM Byrum, Rose Fillers, MD [facility] None   Lab/Order associations: Type 2 diabetes mellitus with hyperglycemia, without long-term current use of insulin ([place]) - Plan: CBC, Comprehensive metabolic panel, Lipid panel, Hemoglobin P3I  Diastolic congestive heart failure, unspecified HF chronicity (HCC), Chronic  PAD (peripheral artery disease) ([place]), Chronic  Hypertension associated with diabetes ([place])  Hyperlipidemia associated with type 2 diabetes mellitus ([place])  Meds ordered this encounter  Medications  . DISCONTD: telmisartan (MICARDIS) 40 MG tablet    Sig: Take 1 tablet (40 mg total) by mouth daily. UNTIL LOSARTAN IS OFF BACKORDER    Dispense:  30 tablet    Refill:  2  . telmisartan (MICARDIS) 80 MG tablet    Sig: Take 1 tablet (80 mg total) by mouth daily.  UNTIL LOSARTAN IS OFF BACKORDER. Please disregard 40mg  dose sent in.    Dispense:  30 tablet    Refill:  2   Return precautions advised.  Garret Reddish, MD

## 2019-04-19 ENCOUNTER — Other Ambulatory Visit: Payer: Self-pay

## 2019-04-19 ENCOUNTER — Other Ambulatory Visit (INDEPENDENT_AMBULATORY_CARE_PROVIDER_SITE_OTHER): Payer: Managed Care, Other (non HMO)

## 2019-04-19 ENCOUNTER — Telehealth: Payer: Self-pay | Admitting: Physician Assistant

## 2019-04-19 DIAGNOSIS — E1165 Type 2 diabetes mellitus with hyperglycemia: Secondary | ICD-10-CM

## 2019-04-19 LAB — LIPID PANEL
Cholesterol: 126 mg/dL (ref 0–200)
HDL: 27 mg/dL — ABNORMAL LOW (ref >39.00)
LDL Cholesterol: 71 mg/dL (ref 0–99)
NonHDL: 99.03
Total CHOL/HDL Ratio: 5
Triglycerides: 140 mg/dL (ref 0.0–149.0)
VLDL: 28 mg/dL (ref 0.0–40.0)

## 2019-04-19 LAB — COMPREHENSIVE METABOLIC PANEL
ALT: 22 U/L (ref 0–35)
AST: 13 U/L (ref 0–37)
Albumin: 4.1 g/dL (ref 3.5–5.2)
Alkaline Phosphatase: 84 U/L (ref 39–117)
BUN: 18 mg/dL (ref 6–23)
CO2: 27 mEq/L (ref 19–32)
Calcium: 9.4 mg/dL (ref 8.4–10.5)
Chloride: 106 mEq/L (ref 96–112)
Creatinine, Ser: 1.05 mg/dL (ref 0.40–1.20)
GFR: 65.69 mL/min (ref >60.00)
Glucose, Bld: 109 mg/dL — ABNORMAL HIGH (ref 70–99)
Potassium: 4 mEq/L (ref 3.5–5.1)
Sodium: 140 mEq/L (ref 135–145)
Total Bilirubin: 0.4 mg/dL (ref 0.2–1.2)
Total Protein: 7.4 g/dL (ref 6.0–8.3)

## 2019-04-19 LAB — CBC
HCT: 45.7 % (ref 36.0–46.0)
Hemoglobin: 15.4 g/dL — ABNORMAL HIGH (ref 12.0–15.0)
MCHC: 33.6 g/dL (ref 30.0–36.0)
MCV: 92.3 fl (ref 78.0–100.0)
Platelets: 241 10*3/uL (ref 150.0–400.0)
RBC: 4.95 Mil/uL (ref 3.87–5.11)
RDW: 14 % (ref 11.5–15.5)
WBC: 7.6 10*3/uL (ref 4.0–10.5)

## 2019-04-19 LAB — HEMOGLOBIN A1C: Hgb A1c MFr Bld: 6.6 % — ABNORMAL HIGH (ref 4.6–6.5)

## 2019-04-19 NOTE — Telephone Encounter (Signed)
° ° ° °  VIDEO/Doxy.me visit on 04/21/2019      Consent sent via MyChart      -     04/19/2019

## 2019-04-20 DIAGNOSIS — I2510 Atherosclerotic heart disease of native coronary artery without angina pectoris: Secondary | ICD-10-CM | POA: Insufficient documentation

## 2019-04-20 NOTE — Progress Notes (Deleted)
{Choose 1 Note Type (Telehealth Visit or Telephone Visit):[phone]}   Date:  04/20/2019   ID:  [name], DOB 11/28/1961, MRN [id number]  {Patient Location:[phone]::"Home"} {Provider Location:[phone]::"Home"}  PCP:  Marin Olp, MD  Cardiologist:  No primary care provider on file. *** Electrophysiologist:  None   Evaluation Performed:  {Choose Visit Type:[id number]::"Follow-Up Visit"}  Chief Complaint:  ***  History of Present Illness:    Lisa [name] is a 58 y.o. female with peripheral vascular disease s/p L SFA angioplasty in 2018, recurrent chest pain with minimal coronary plaque by cardiac cath in 2009, COPD, hypertension, hyperlipidemia, diabetes.  She was last seen in clinic by Ermalinda Barrios, PA-C in 09/2017.  ***  The patient {does/does not:200015} have symptoms concerning for COVID-19 infection (fever, chills, cough, or new shortness of breath).    Past Medical History:  Diagnosis Date  . Allergic rhinitis   . Asthma   . Breast nodule    right breast, to see dr Helane Rima 06-20-2012 for   . Chest pain, non-cardiac   . Chronic headaches   . Cigarette smoker   . Complication of anesthesia 05-17-2012   trouble breathing after colonscopy, needed nebulizer  . Diastolic congestive heart failure ([place]) 04/17/2019  . Diverticulosis   . GERD (gastroesophageal reflux disease)   . Hepatic hemangioma   . Hypertension   . IBS (irritable bowel syndrome)   . Obesity   . Prosthetic eye globe   . Pyloric erosion   . Sleep apnea    CPAP setting varies from 4-10  . Type 2 diabetes mellitus ([place]) 03/11/2017   Past Surgical History:  Procedure Laterality Date  . ABDOMINAL AORTOGRAM W/LOWER EXTREMITY N/A 04/30/2017   Procedure: Abdominal Aortogram w/Lower Extremity;  Surgeon: Elam Dutch, MD;  Location: [facility];  Service: Cardiovascular;  Laterality: N/A;  . CARDIAC CATHETERIZATION  12/28/2007   EF 75%. IT REVEALS NORMAL/SUPRANORMAL LEFT VENTRICULAR  SYSTOLIC FUNCTION  . CARDIOVASCULAR STRESS TEST  03/25/2007   EF 78%  . CESAREAN SECTION  2003, 2009   x 2  . CHOLECYSTECTOMY  1993  . COLONOSCOPY  2014  . ENDOMETRIAL ABLATION  09/2010   and D&C  . HERNIA REPAIR  8413   umbilical  . LAPAROSCOPY    . PERIPHERAL VASCULAR BALLOON ANGIOPLASTY Left 04/30/2017   Procedure: Peripheral Vascular Balloon Angioplasty;  Surgeon: Elam Dutch, MD;  Location: [facility];  Service: Cardiovascular;  Laterality: Left;  SFA  . TUBAL LIGATION    . UMBILICAL HERNIA REPAIR  2004  . US ECHOCARDIOGRAPHY  05/08/2008   EF 55-60%  . VENTRAL HERNIA REPAIR  06/15/2012   Procedure: LAPAROSCOPIC VENTRAL HERNIA;  Surgeon: Edward Jolly, MD;  Location: [facility];  Service: General;  Laterality: N/A;  LAPAROSCOPIC REPAIR VENTRAL HERNIA  . VIDEO BRONCHOSCOPY Bilateral 11/15/2017   Procedure: VIDEO BRONCHOSCOPY WITH FLUORO;  Surgeon: Collene Gobble, MD;  Location: [facility];  Service: Cardiopulmonary;  Laterality: Bilateral;     No outpatient medications have been marked as taking for the 04/21/19 encounter (Appointment) with Richardson Dopp T, PA-C.     Allergies:   Almond oil; Apple; Ceftin [cefuroxime]; Cherry extract; Other; Guaifenesin & derivatives; Hctz [hydrochlorothiazide]; Morphine and related; Naproxen; and Lisinopril   Social History   Tobacco Use  . Smoking status: Current Every Day Smoker    Packs/day: 1.00    Years: 38.00    Pack years: 38.00    Types: Cigarettes  . Smokeless tobacco:  Never Used  . Tobacco comment: 10 cigarettes per day  Substance Use Topics  . Alcohol use: No    Alcohol/week: 0.0 standard drinks  . Drug use: No     Family Hx: The patient's family history includes Breast cancer in an other family member; Diabetes in her maternal grandmother; Heart attack in her paternal uncle; Heart disease in her father; Hypertension in her mother; Lung cancer in her mother. There is no history of Colon cancer.  ROS:    Please see the history of present illness.    *** All other systems reviewed and are negative.   Prior CV studies:   The following studies were reviewed today:  Carotid US 09/07/17 Bilat ICA < 40  Echo 08/18/17 Mild LVH, EF 60-65, mild LAE  Echo 07/25/15 Mild LVH, EF 55-60, no RWMA, Gr 1 DD, mild LAE  Myoview 03/06/14 Small amount of ischemia in apical anterior and true apex (reviewed by Dr Acie Fredrickson >> ?breast atten), EF 73; Intermediate Risk   Cardiac Catheterization 12/28/2007 LAD irregs  RI irregs LCx irregs RCA irregs EF 75   Labs/Other Tests and Data Reviewed:    EKG:  {EKG/Telemetry Strips Reviewed:[phone]}  Recent Labs: 04/19/2019: ALT 22; BUN 18; Creatinine, Ser 1.05; Hemoglobin 15.4; Platelets 241.0; Potassium 4.0; Sodium 140   Recent Lipid Panel Lab Results  Component Value Date/Time   CHOL 126 04/19/2019 10:25 AM   CHOL 130 04/22/2017 11:45 AM   TRIG 140.0 04/19/2019 10:25 AM   HDL 27.00 (L) 04/19/2019 10:25 AM   HDL 25 (L) 04/22/2017 11:45 AM   CHOLHDL 5 04/19/2019 10:25 AM   LDLCALC 71 04/19/2019 10:25 AM   LDLCALC 77 04/22/2017 11:45 AM    Wt Readings from Last 3 Encounters:  04/17/19 268 lb (121.6 kg)  03/30/19 268 lb (121.6 kg)  01/16/19 268 lb 3.2 oz (121.7 kg)     Objective:    Vital Signs:  There were no vitals taken for this visit.   {[facility] Virtual Exam (Optional):[phone]::"VITAL SIGNS:  reviewed"}  ASSESSMENT & PLAN:    1. *** Coronary artery calcification  PAD (peripheral artery disease) (HCC)  Hyperlipidemia associated with type 2 diabetes mellitus (HCC)  Essential hypertension  Chronic obstructive pulmonary disease, unspecified COPD type ([place])  Educated About Covid-19 Virus Infection    COVID-19 Education: The signs and symptoms of COVID-19 were discussed with the patient and how to seek care for testing (follow up with PCP or arrange E-visit).  ***The importance of social distancing was discussed today.  Time:    Today, I have spent *** minutes with the patient with telehealth technology discussing the above problems.     Medication Adjustments/Labs and Tests Ordered: Current medicines are reviewed at length with the patient today.  Concerns regarding medicines are outlined above.   Tests Ordered: No orders of the defined types were placed in this encounter.   Medication Changes: No orders of the defined types were placed in this encounter.   Disposition:  Follow up {follow up:15908}  Signed, Richardson Dopp, PA-C  04/20/2019 8:12 PM    [facility]

## 2019-04-21 ENCOUNTER — Other Ambulatory Visit: Payer: Self-pay

## 2019-04-21 ENCOUNTER — Telehealth: Payer: Managed Care, Other (non HMO) | Admitting: Physician Assistant

## 2019-05-03 NOTE — Progress Notes (Signed)
[name] [address], [place] [address] Phone: [phone] Subjective:   [name] No, am serving as a scribe for Dr. Hulan Saas.  I'm seeing this patient by the request  of:    CC: Hand pain  IOE:[id number]  Lisa [name] is a 58 y.o. female coming in with complaint of right hand middle finger. Said that her finger began triggering 2.5 weeks ago. Is unable to make a fist.  Patient was seen previously by another provider in January and given an injection at that time.  Patient states is affecting daily activities.  Making it difficult to even dress.    Past Medical History:  Diagnosis Date  . Allergic rhinitis   . Asthma   . Breast nodule    right breast, to see dr Helane Rima 06-20-2012 for   . Chest pain, non-cardiac   . Chronic headaches   . Cigarette smoker   . Complication of anesthesia 05-17-2012   trouble breathing after colonscopy, needed nebulizer  . Diastolic congestive heart failure ([place]) 04/17/2019  . Diverticulosis   . GERD (gastroesophageal reflux disease)   . Hepatic hemangioma   . Hypertension   . IBS (irritable bowel syndrome)   . Obesity   . Prosthetic eye globe   . Pyloric erosion   . Sleep apnea    CPAP setting varies from 4-10  . Type 2 diabetes mellitus ([place]) 03/11/2017   Past Surgical History:  Procedure Laterality Date  . ABDOMINAL AORTOGRAM W/LOWER EXTREMITY N/A 04/30/2017   Procedure: Abdominal Aortogram w/Lower Extremity;  Surgeon: Elam Dutch, MD;  Location: [facility];  Service: Cardiovascular;  Laterality: N/A;  . CARDIAC CATHETERIZATION  12/28/2007   EF 75%. IT REVEALS NORMAL/SUPRANORMAL LEFT VENTRICULAR SYSTOLIC FUNCTION  . CARDIOVASCULAR STRESS TEST  03/25/2007   EF 78%  . CESAREAN SECTION  2003, 2009   x 2  . CHOLECYSTECTOMY  1993  . COLONOSCOPY  2014  . ENDOMETRIAL ABLATION  09/2010   and D&C  . HERNIA REPAIR  8299   umbilical  . LAPAROSCOPY    . PERIPHERAL VASCULAR BALLOON  ANGIOPLASTY Left 04/30/2017   Procedure: Peripheral Vascular Balloon Angioplasty;  Surgeon: Elam Dutch, MD;  Location: [facility];  Service: Cardiovascular;  Laterality: Left;  SFA  . TUBAL LIGATION    . UMBILICAL HERNIA REPAIR  2004  . US ECHOCARDIOGRAPHY  05/08/2008   EF 55-60%  . VENTRAL HERNIA REPAIR  06/15/2012   Procedure: LAPAROSCOPIC VENTRAL HERNIA;  Surgeon: Edward Jolly, MD;  Location: [facility];  Service: General;  Laterality: N/A;  LAPAROSCOPIC REPAIR VENTRAL HERNIA  . VIDEO BRONCHOSCOPY Bilateral 11/15/2017   Procedure: VIDEO BRONCHOSCOPY WITH FLUORO;  Surgeon: Collene Gobble, MD;  Location: [facility];  Service: Cardiopulmonary;  Laterality: Bilateral;   Social History   Socioeconomic History  . Marital status: Married    Spouse name: Science writer  . Number of children: 2  . Years of education: 14+  . Highest education level: Not on file  Occupational History  . Occupation: stay at home mom  . Occupation: Ship broker  Social Needs  . Financial resource strain: Not on file  . Food insecurity:    Worry: Not on file    Inability: Not on file  . Transportation needs:    Medical: Not on file    Non-medical: Not on file  Tobacco Use  . Smoking status: Current Every Day Smoker    Packs/day: 1.00  Years: 38.00    Pack years: 38.00    Types: Cigarettes  . Smokeless tobacco: Never Used  . Tobacco comment: 10 cigarettes per day  Substance and Sexual Activity  . Alcohol use: No    Alcohol/week: 0.0 standard drinks  . Drug use: No  . Sexual activity: Yes    Comment: intercourse age 58, less than 5 sexual partners,des neg  Lifestyle  . Physical activity:    Days per week: Not on file    Minutes per session: Not on file  . Stress: Not on file  Relationships  . Social connections:    Talks on phone: Not on file    Gets together: Not on file    Attends religious service: Not on file    Active member of club or organization: Not on file    Attends meetings  of clubs or organizations: Not on file    Relationship status: Not on file  Other Topics Concern  . Not on file  Social History Narrative   Married 1997. 2 sons 53 and 15 years old in 10/18. Wants to see children grow up.       Customer Service/accounts payable.    Went to school for medical assisting- has had a hard time finding work      Hobbies: [employer], enjoying [place], getting out of home, wants to take [employer]   Allergies  Allergen Reactions  . Almond Oil Anaphylaxis and Nausea And Vomiting    Almond-Nuts Only  . Apple Anaphylaxis  . Ceftin [Cefuroxime] Other (See Comments)    Severe stomach pain - Diverticulitis flares up  . Cherry Extract Anaphylaxis    Cherry fruit only   . Other Anaphylaxis and Itching    Most fruits- Are NOT tolerated (PLEASE ASK PATIENT BEFORE GIVING, AS CERTAIN FRUITS ARE TOLERATED IN LIMITED QUANTITIES!!)  . Guaifenesin & Derivatives Other (See Comments)    Restlessness, jittery  . Hctz [Hydrochlorothiazide] Other (See Comments)    Causes a headache  . Morphine And Related Other (See Comments)    Moodiness.  Causes anger.  . Naproxen Other (See Comments)    Insomnia  . Lisinopril Cough        Family History  Problem Relation Age of Onset  . Hypertension Mother   . Lung cancer Mother        19  . Diabetes Maternal Grandmother   . Heart disease Father        pacemaker or defibrillator  . Heart attack Paternal Uncle   . Breast cancer Other   . Colon cancer Neg Hx     Current Outpatient Medications (Endocrine & Metabolic):  Marland Kitchen  OZEMPIC, 0.25 OR 0.5 MG/DOSE, 2 MG/1.5ML SOPN, INJECT 0.5 MG INTO THE SKIN ONCE A WEEK.  Current Outpatient Medications (Cardiovascular):  .  atorvastatin (LIPITOR) 40 MG tablet, TAKE 1 TABLET (40 MG TOTAL) BY MOUTH DAILY. .  carvedilol (COREG) 25 MG tablet, TAKE 1 TABLET BY MOUTH TWICE A DAY .  losartan (COZAAR) 100 MG tablet, TAKE 1 TABLET BY MOUTH EVERY DAY .  spironolactone (ALDACTONE) 50 MG tablet, Take 1  tablet (50 mg total) by mouth daily. Marland Kitchen  telmisartan (MICARDIS) 80 MG tablet, Take 1 tablet (80 mg total) by mouth daily. UNTIL LOSARTAN IS OFF BACKORDER. Please disregard 40mg  dose sent in.  Current Outpatient Medications (Respiratory):  .  albuterol (PROVENTIL HFA;VENTOLIN HFA) 108 (90 Base) MCG/ACT inhaler, Inhale 2 puffs into the lungs every 6 (six) hours as needed  for wheezing or shortness of breath.  Current Outpatient Medications (Analgesics):  .  aspirin EC 81 MG tablet, Take 81 mg by mouth daily. .  traMADol (ULTRAM) 50 MG tablet, Take 1 tablet (50 mg total) by mouth every 6 (six) hours as needed.   Current Outpatient Medications (Other):  Marland Kitchen  buPROPion (WELLBUTRIN SR) 150 MG 12 hr tablet, Take 1 tablet (150 mg total) by mouth 2 (two) times daily. .  diclofenac sodium (VOLTAREN) 1 % GEL, Apply 2-4 g topically 3 (three) times daily. Marland Kitchen  dicyclomine (BENTYL) 10 MG capsule, Take 1 capsule (10 mg total) by mouth 3 (three) times daily before meals. As needed .  ONE TOUCH LANCETS MISC, Use to check blood sugars twice a day .  pantoprazole (PROTONIX) 40 MG tablet, Take 1 tablet (40 mg total) by mouth daily.    Past medical history, social, surgical and family history all reviewed in electronic medical record.  No pertanent information unless stated regarding to the chief complaint.   Review of Systems:  No headache, visual changes, nausea, vomiting, diarrhea, constipation, dizziness, abdominal pain, skin rash, fevers, chills, night sweats, weight loss, swollen lymph nodes, body aches, joint swelling, muscle aches, chest pain, shortness of breath, mood changes.   Objective  Blood pressure 118/62, pulse 83, height 5\' 6"  (1.676 m), weight 273 lb (123.8 kg), SpO2 98 %.    General: No apparent distress alert and oriented x3 mood and affect normal, dressed appropriately.  HEENT: Pupils equal, extraocular movements intact  Respiratory: Patient's speak in full sentences and does not appear  short of breath  Cardiovascular: No lower extremity edema, non tender, no erythema  Skin: Warm dry intact with no signs of infection or rash on extremities or on axial skeleton.  Abdomen: Soft nontender  Neuro: Cranial nerves II through XII are intact, neurovascularly intact in all extremities with 2+ DTRs and 2+ pulses.  Lymph: No lymphadenopathy of posterior or anterior cervical chain or axillae bilaterally.  Gait normal with good balance and coordination.  MSK:    Right middle finger shows the patient does have a triggering at the A2 pulley.  Severely tender to palpation on the flexor tendon sheath.  Procedure: Real-time Ultrasound Guided Injection of third finger flexor tendon sheath injection Device: GE Logiq Q7 Ultrasound guided injection is preferred based studies that show increased duration, increased effect, greater accuracy, decreased procedural pain, increased response rate, and decreased cost with ultrasound guided versus blind injection.  Verbal informed consent obtained.  Time-out conducted.  Noted no overlying erythema, induration, or other signs of local infection.  Skin prepped in a sterile fashion.  Local anesthesia: Topical Ethyl chloride.  With sterile technique and under real time ultrasound guidance: With a 25-gauge half inch needle injected with 0.5 cc of 0.5% Marcaine and 0.5 cc of Kenalog 40 mg/mL Completed without difficulty  Pain immediately resolved suggesting accurate placement of the medication.  Advised to call if fevers/chills, erythema, induration, drainage, or persistent bleeding.  Images permanently stored and available for review in the ultrasound unit.  Impression: Technically successful ultrasound guided injection.      Impression and Recommendations:     This case required medical decision making of moderate complexity. The above documentation has been reviewed and is accurate and complete Lyndal Pulley, DO       Note: This dictation was  prepared with Dragon dictation along with smaller phrase technology. Any transcriptional errors that result from this process are unintentional.

## 2019-05-04 ENCOUNTER — Other Ambulatory Visit: Payer: Self-pay

## 2019-05-04 ENCOUNTER — Encounter: Payer: Self-pay | Admitting: Family Medicine

## 2019-05-04 ENCOUNTER — Ambulatory Visit (INDEPENDENT_AMBULATORY_CARE_PROVIDER_SITE_OTHER): Payer: Managed Care, Other (non HMO) | Admitting: Family Medicine

## 2019-05-04 ENCOUNTER — Ambulatory Visit: Payer: Self-pay

## 2019-05-04 VITALS — BP 118/62 | HR 83 | Ht 66.0 in | Wt 273.0 lb

## 2019-05-04 DIAGNOSIS — M79641 Pain in right hand: Secondary | ICD-10-CM

## 2019-05-04 DIAGNOSIS — M65331 Trigger finger, right middle finger: Secondary | ICD-10-CM

## 2019-05-04 NOTE — Patient Instructions (Signed)
Good to see you  Tried another injection  Brace at night Ice may be needed See me again in 6 weeks if not perfect

## 2019-05-04 NOTE — Assessment & Plan Note (Signed)
Trigger finger injected today.  Tolerated the procedure well.  Bracing at night.  Discussed now has had 4 different injections in the thumb could need surgical intervention.  Patient will consider this.  Topical anti-inflammatories refilled.  Follow-up again in 3 months otherwise

## 2019-05-04 NOTE — Progress Notes (Signed)
Virtual Visit via Video Note   This visit type was conducted due to national recommendations for restrictions regarding the COVID-19 Pandemic (e.g. social distancing) in an effort to limit this patient's exposure and mitigate transmission in our community.  Due to her co-morbid illnesses, this patient is at least at moderate risk for complications without adequate follow up.  This format is felt to be most appropriate for this patient at this time.  All issues noted in this document were discussed and addressed.  A limited physical exam was performed with this format.  Please refer to the patient's chart for her consent to telehealth for [facility].   Date:  05/05/2019   ID:  Lisa [name], DOB 04-29-1961, MRN [id number]  Patient Location: Home Provider Location: Home  PCP:  Marin Olp, MD  Cardiologist:  Mertie Moores, MD   Electrophysiologist:  None   Evaluation Performed:  Follow-Up Visit  Chief Complaint:  Follow up on HTN, CAD  History of Present Illness:    Lisa [name] is a 58 y.o. female with peripheral vascular disease s/p L SFA angioplasty in 2018, recurrent chest pain with minimal coronary plaque by cardiac cath in 2009, COPD, hypertension, hyperlipidemia, diabetes.  She was last seen in clinic by Ermalinda Barrios, PA-C in 09/2017.  Today, she notes that she is doing well.  She has had some substernal chest discomfort.  This seems to be occur more often after meals.  She often has some nausea and headache associated with it.  After vomiting, she feels better.  Antacids may be providing some relief.  She has noticed discomfort with some activities in the past as well.  She has not really had any significant shortness of breath.  She has not had any syncope.  She gets dizzy sometimes.  She has had some radicular symptoms related to her back.  She has had 2 epidural steroid injections with mixed results.  She has another injection pending.  The patient does not have  symptoms concerning for COVID-19 infection (fever, chills, cough, or new shortness of breath).    Past Medical History:  Diagnosis Date  . Allergic rhinitis   . Asthma   . Breast nodule    right breast, to see dr Helane Rima 06-20-2012 for   . Chest pain, non-cardiac   . Chronic headaches   . Cigarette smoker   . Complication of anesthesia 05-17-2012   trouble breathing after colonscopy, needed nebulizer  . Diastolic congestive heart failure ([place]) 04/17/2019  . Diverticulosis   . GERD (gastroesophageal reflux disease)   . Hepatic hemangioma   . Hypertension   . IBS (irritable bowel syndrome)   . Obesity   . Prosthetic eye globe   . Pyloric erosion   . Sleep apnea    CPAP setting varies from 4-10  . Type 2 diabetes mellitus ([place]) 03/11/2017   Past Surgical History:  Procedure Laterality Date  . ABDOMINAL AORTOGRAM W/LOWER EXTREMITY N/A 04/30/2017   Procedure: Abdominal Aortogram w/Lower Extremity;  Surgeon: Elam Dutch, MD;  Location: [facility];  Service: Cardiovascular;  Laterality: N/A;  . CARDIAC CATHETERIZATION  12/28/2007   EF 75%. IT REVEALS NORMAL/SUPRANORMAL LEFT VENTRICULAR SYSTOLIC FUNCTION  . CARDIOVASCULAR STRESS TEST  03/25/2007   EF 78%  . CESAREAN SECTION  2003, 2009   x 2  . CHOLECYSTECTOMY  1993  . COLONOSCOPY  2014  . ENDOMETRIAL ABLATION  09/2010   and D&C  . HERNIA REPAIR  2004  umbilical  . LAPAROSCOPY    . PERIPHERAL VASCULAR BALLOON ANGIOPLASTY Left 04/30/2017   Procedure: Peripheral Vascular Balloon Angioplasty;  Surgeon: Elam Dutch, MD;  Location: [facility];  Service: Cardiovascular;  Laterality: Left;  SFA  . TUBAL LIGATION    . UMBILICAL HERNIA REPAIR  2004  . US ECHOCARDIOGRAPHY  05/08/2008   EF 55-60%  . VENTRAL HERNIA REPAIR  06/15/2012   Procedure: LAPAROSCOPIC VENTRAL HERNIA;  Surgeon: Edward Jolly, MD;  Location: [facility];  Service: General;  Laterality: N/A;  LAPAROSCOPIC REPAIR VENTRAL HERNIA  . VIDEO  BRONCHOSCOPY Bilateral 11/15/2017   Procedure: VIDEO BRONCHOSCOPY WITH FLUORO;  Surgeon: Collene Gobble, MD;  Location: [facility];  Service: Cardiopulmonary;  Laterality: Bilateral;     Current Meds  Medication Sig  . albuterol (PROVENTIL HFA;VENTOLIN HFA) 108 (90 Base) MCG/ACT inhaler Inhale 2 puffs into the lungs every 6 (six) hours as needed for wheezing or shortness of breath.  Marland Kitchen aspirin EC 81 MG tablet Take 81 mg by mouth daily.  Marland Kitchen buPROPion (WELLBUTRIN SR) 150 MG 12 hr tablet Take 1 tablet (150 mg total) by mouth 2 (two) times daily.  . carvedilol (COREG) 25 MG tablet TAKE 1 TABLET BY MOUTH TWICE A DAY  . diclofenac sodium (VOLTAREN) 1 % GEL Apply 2-4 g topically 3 (three) times daily.  Marland Kitchen dicyclomine (BENTYL) 10 MG capsule Take 1 capsule (10 mg total) by mouth 3 (three) times daily before meals. As needed  . ONE TOUCH LANCETS MISC Use to check blood sugars twice a day  . OZEMPIC, 0.25 OR 0.5 MG/DOSE, 2 MG/1.5ML SOPN INJECT 0.5 MG INTO THE SKIN ONCE A WEEK.  . pantoprazole (PROTONIX) 40 MG tablet Take 1 tablet (40 mg total) by mouth daily.  Marland Kitchen spironolactone (ALDACTONE) 50 MG tablet Take 1 tablet (50 mg total) by mouth daily.  Marland Kitchen telmisartan (MICARDIS) 80 MG tablet Take 1 tablet (80 mg total) by mouth daily. UNTIL LOSARTAN IS OFF BACKORDER. Please disregard 40mg  dose sent in.  . traMADol (ULTRAM) 50 MG tablet Take 1 tablet (50 mg total) by mouth every 6 (six) hours as needed.  . [DISCONTINUED] atorvastatin (LIPITOR) 40 MG tablet TAKE 1 TABLET (40 MG TOTAL) BY MOUTH DAILY.     Allergies:   Almond oil; Apple; Ceftin [cefuroxime]; Cherry extract; Other; Guaifenesin & derivatives; Hctz [hydrochlorothiazide]; Morphine and related; Naproxen; and Lisinopril   Social History   Tobacco Use  . Smoking status: Current Every Day Smoker    Packs/day: 1.00    Years: 38.00    Pack years: 38.00    Types: Cigarettes  . Smokeless tobacco: Never Used  . Tobacco comment: 10 cigarettes per day   Substance Use Topics  . Alcohol use: No    Alcohol/week: 0.0 standard drinks  . Drug use: No     Family Hx: The patient's family history includes Breast cancer in an other family member; Diabetes in her maternal grandmother; Heart attack in her paternal uncle; Heart disease in her father; Hypertension in her mother; Lung cancer in her mother. There is no history of Colon cancer.  ROS:   Please see the history of present illness.     All other systems reviewed and are negative.   Prior CV studies:   The following studies were reviewed today:  Carotid US 09/07/17 Bilat ICA < 40  [facility] 08/18/17 Mild LVH, EF 60-65, mild LAE  [facility] 07/25/15 Mild LVH, EF 55-60, no RWMA, Gr 1 DD, mild LAE  Myoview 03/06/14 Small amount of ischemia in apical anterior and true apex (reviewed by Dr Acie Fredrickson >> ?breast atten), EF 73; Intermediate Risk   Cardiac Catheterization 12/28/2007 LAD irregs  RI irregs LCx irregs RCA irregs EF 75   Labs/Other Tests and Data Reviewed:    EKG:  No ECG reviewed.  Recent Labs: 04/19/2019: ALT 22; BUN 18; Creatinine, Ser 1.05; Hemoglobin 15.4; Platelets 241.0; Potassium 4.0; Sodium 140   Recent Lipid Panel Lab Results  Component Value Date/Time   CHOL 126 04/19/2019 10:25 AM   CHOL 130 04/22/2017 11:45 AM   TRIG 140.0 04/19/2019 10:25 AM   HDL 27.00 (L) 04/19/2019 10:25 AM   HDL 25 (L) 04/22/2017 11:45 AM   CHOLHDL 5 04/19/2019 10:25 AM   LDLCALC 71 04/19/2019 10:25 AM   LDLCALC 77 04/22/2017 11:45 AM     Wt Readings from Last 3 Encounters:  05/05/19 273 lb (123.8 kg)  05/04/19 273 lb (123.8 kg)  04/17/19 268 lb (121.6 kg)     Objective:    Vital Signs:  BP 126/78   Pulse 84   Temp (!) 96.1 F (35.6 C)   Ht 5\' 6"  (1.676 m)   Wt 273 lb (123.8 kg)   BMI 44.06 kg/m    VITAL SIGNS:  reviewed GEN:  no acute distress EYES:  Sclera anicteric RESPIRATORY:  Normal respiratory effort NEURO:  alert and oriented x 3, no obvious focal deficit PSYCH:   normal affect  ASSESSMENT & PLAN:     Chest pain She has had some chest discomfort off and on for a couple of months.  This seems to be more consistent with a gastrointestinal etiology than ischemia.  However, with diabetes, she is at higher risk for progressive coronary artery disease.  She had some calcification noted cardiac catheterization in 2009.  Last stress test was in 2015.  I recommend that we proceed with a Lexiscan Myoview to rule out ischemia.  -Arrange Lexiscan Myoview  Coronary artery calcification As noted, she had minimal plaque on cardiac catheterization in 2009.  Proceed with stress testing as noted.  Continue aspirin, statin.  PAD (peripheral artery disease) ([place]) She has had recent leg issues.  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  She has had 2 injections with mixed results.  She is followed by vascular surgery for peripheral arterial disease.  Hypertension associated with diabetes ([place]) Blood pressure has been well controlled.  There was a shortage of losartan and she was switched to telmisartan.  She has checked her blood pressure at times prior to her medications and is been optimal.  Overall, she thinks that her blood pressure is better controlled than it was previously on losartan.  I have recommended that she remain on telmisartan for now.  I have asked her to check her blood pressure 2-3 times a day for 2 weeks and send me those readings.  Hyperlipidemia, unspecified hyperlipidemia type She has had problems with leg pain related to statin therapy.  She stopped atorvastatin for 1 week with markedly improved symptoms.  I recommend that we switch her from atorvastatin to rosuvastatin.  -DC atorvastatin  -Start rosuvastatin 10 mg daily  -Arrange fasting lipids and LFTs in 3 months  -If she has recurrent myalgias, consider ezetimibe versus referral to lipid clinic  COVID-19 Education: The signs and symptoms of COVID-19 were discussed with  the patient and how to seek care for testing (follow up with PCP or arrange E-visit).  The importance of social  distancing was discussed today.  Time:   Today, I have spent 29 minutes with the patient with telehealth technology discussing the above problems.     Medication Adjustments/Labs and Tests Ordered: Current medicines are reviewed at length with the patient today.  Concerns regarding medicines are outlined above.   Tests Ordered: Orders Placed This Encounter  Procedures  . Hepatic function panel  . Lipid panel  . MYOCARDIAL PERFUSION IMAGING    Medication Changes: Meds ordered this encounter  Medications  . rosuvastatin (CRESTOR) 10 MG tablet    Sig: Take 1 tablet (10 mg total) by mouth daily.    Dispense:  30 tablet    Refill:  11    Stop Atorvastatin    Order Specific Question:   Supervising Provider    Answer:   Lelon Perla [[id number]]    Disposition:  Follow up in 6 month(s)  Signed, Richardson Dopp, PA-C  05/05/2019 1:57 PM    [facility]

## 2019-05-05 ENCOUNTER — Telehealth: Payer: Managed Care, Other (non HMO) | Admitting: Physician Assistant

## 2019-05-05 ENCOUNTER — Encounter: Payer: Self-pay | Admitting: Physician Assistant

## 2019-05-05 ENCOUNTER — Other Ambulatory Visit: Payer: Self-pay

## 2019-05-05 ENCOUNTER — Encounter: Payer: Self-pay | Admitting: *Deleted

## 2019-05-05 ENCOUNTER — Telehealth (INDEPENDENT_AMBULATORY_CARE_PROVIDER_SITE_OTHER): Payer: Managed Care, Other (non HMO) | Admitting: Physician Assistant

## 2019-05-05 VITALS — BP 126/78 | HR 84 | Temp 96.1°F | Ht 66.0 in | Wt 273.0 lb

## 2019-05-05 DIAGNOSIS — R079 Chest pain, unspecified: Secondary | ICD-10-CM

## 2019-05-05 DIAGNOSIS — I2510 Atherosclerotic heart disease of native coronary artery without angina pectoris: Secondary | ICD-10-CM

## 2019-05-05 DIAGNOSIS — E785 Hyperlipidemia, unspecified: Secondary | ICD-10-CM

## 2019-05-05 DIAGNOSIS — Z7189 Other specified counseling: Secondary | ICD-10-CM

## 2019-05-05 DIAGNOSIS — E1159 Type 2 diabetes mellitus with other circulatory complications: Secondary | ICD-10-CM

## 2019-05-05 DIAGNOSIS — E1165 Type 2 diabetes mellitus with hyperglycemia: Secondary | ICD-10-CM

## 2019-05-05 DIAGNOSIS — J449 Chronic obstructive pulmonary disease, unspecified: Secondary | ICD-10-CM

## 2019-05-05 DIAGNOSIS — I2584 Coronary atherosclerosis due to calcified coronary lesion: Secondary | ICD-10-CM | POA: Diagnosis not present

## 2019-05-05 DIAGNOSIS — I739 Peripheral vascular disease, unspecified: Secondary | ICD-10-CM

## 2019-05-05 DIAGNOSIS — I152 Hypertension secondary to endocrine disorders: Secondary | ICD-10-CM

## 2019-05-05 MED ORDER — ROSUVASTATIN CALCIUM 10 MG PO TABS: 10.0000 mg | ORAL_TABLET | Freq: Every day | ORAL | 11 refills | Status: DC

## 2019-05-05 NOTE — Patient Instructions (Addendum)
Medication Instructions:  1. Stop Atorvastatin  2. Start Rosuvastatin 10 mg once daily    If you need a refill on your cardiac medications before your next appointment, please call your pharmacy.   Lab work:  Lipids and LFTs in 3 mos   If you have labs (blood work) drawn today and your tests are completely normal, you will receive your results only by: Marland Kitchen MyChart Message (if you have MyChart) OR . A paper copy in the mail If you have any lab test that is abnormal or we need to change your treatment, we will call you to review the results.  Testing/Procedures:Your physician has requested that you have a lexiscan myoview. For further information please visit [email or web address]. Please follow instruction sheet, as given. Follow-Up:   At [facility], you and your health needs are our priority.  As part of our continuing mission to provide you with exceptional heart care, we have created designated Provider Care Teams.  These Care Teams include your primary Cardiologist (physician) and Advanced Practice Providers (APPs -  Physician Assistants and Nurse Practitioners) who all work together to provide you with the care you need, when you need it. You will need a follow up appointment in:  6 months.  Please call our office 2 months in advance to schedule this appointment.  You may see Mertie Moores, MD or one of the following Advanced Practice Providers on your designated Care Team: Richardson Dopp, PA-C [place], [place] . Daune Perch, NP  Any Other Special Instructions Will Be Listed Below (If Applicable). Wait 2 weeks after stopping Atorvastatin then start Rosuvastatin  Check BP 2-3 times a day for 2 weeks and send readings through MyChart to review.

## 2019-05-11 ENCOUNTER — Telehealth (HOSPITAL_COMMUNITY): Payer: Self-pay | Admitting: *Deleted

## 2019-05-11 NOTE — Telephone Encounter (Signed)
Patient given detailed instructions per Myocardial Perfusion Study Information Sheet for the test on 05/15/19 at 10:45. Patient notified to arrive 15 minutes early and that it is imperative to arrive on time for appointment to keep from having the test rescheduled.  If you need to cancel or reschedule your appointment, please call the office within 24 hours of your appointment. . Patient verbalized understanding.Debby Freiberg Brooks\

## 2019-05-15 ENCOUNTER — Ambulatory Visit (HOSPITAL_COMMUNITY): Payer: Managed Care, Other (non HMO) | Attending: Cardiology

## 2019-05-15 ENCOUNTER — Other Ambulatory Visit: Payer: Self-pay

## 2019-05-15 ENCOUNTER — Encounter (HOSPITAL_COMMUNITY): Payer: Self-pay

## 2019-05-15 DIAGNOSIS — R079 Chest pain, unspecified: Secondary | ICD-10-CM | POA: Diagnosis present

## 2019-05-15 MED ORDER — REGADENOSON 0.4 MG/5ML IV SOLN
0.4000 mg | Freq: Once | INTRAVENOUS | Status: AC
  Administered 2019-05-15: 0.4 mg via INTRAVENOUS

## 2019-05-15 MED ORDER — TECHNETIUM TC 99M TETROFOSMIN IV KIT
11.0000 | PACK | Freq: Once | INTRAVENOUS | Status: AC | PRN
  Administered 2019-05-15: 11 via INTRAVENOUS
  Filled 2019-05-15: qty 11

## 2019-05-15 MED ORDER — TECHNETIUM TC 99M TETROFOSMIN IV KIT
33.0000 | PACK | Freq: Once | INTRAVENOUS | Status: AC | PRN
  Administered 2019-05-15: 33 via INTRAVENOUS
  Filled 2019-05-15: qty 33

## 2019-05-16 ENCOUNTER — Encounter: Payer: Self-pay | Admitting: Physician Assistant

## 2019-05-16 LAB — MYOCARDIAL PERFUSION IMAGING
LV dias vol: 87 mL (ref 46–106)
LV sys vol: 33 mL
Peak HR: 90 {beats}/min
Rest HR: 74 {beats}/min
SDS: 4
SRS: 0
SSS: 4
TID: 0.97

## 2019-05-18 ENCOUNTER — Encounter: Payer: Self-pay | Admitting: Family Medicine

## 2019-05-29 ENCOUNTER — Other Ambulatory Visit: Payer: Self-pay

## 2019-05-30 ENCOUNTER — Ambulatory Visit (INDEPENDENT_AMBULATORY_CARE_PROVIDER_SITE_OTHER): Payer: Managed Care, Other (non HMO) | Admitting: Women's Health

## 2019-05-30 ENCOUNTER — Encounter: Payer: Self-pay | Admitting: Women's Health

## 2019-05-30 VITALS — BP 128/80 | Ht 65.0 in | Wt 271.0 lb

## 2019-05-30 DIAGNOSIS — Z01419 Encounter for gynecological examination (general) (routine) without abnormal findings: Secondary | ICD-10-CM

## 2019-05-30 LAB — UNLABELED: Test Ordered On Req: 17667

## 2019-05-30 MED ORDER — NYSTATIN-TRIAMCINOLONE 100000-0.1 UNIT/GM-% EX OINT: 1.0000 "application " | TOPICAL_OINTMENT | Freq: Two times a day (BID) | CUTANEOUS | 0 refills | Status: DC

## 2019-05-30 NOTE — Patient Instructions (Addendum)
Health Maintenance for Postmenopausal Women Menopause is a normal process in which your ability to get pregnant comes to an end. This process happens slowly over many months or years, usually between the ages of 48 and 55. Menopause is complete when you have missed your menstrual periods for 12 months. It is important to talk with your health care provider about some of the most common conditions that affect women after menopause (postmenopausal women). These include heart disease, cancer, and bone loss (osteoporosis). Adopting a healthy lifestyle and getting preventive care can help to promote your health and wellness. The actions you take can also lower your chances of developing some of these common conditions. What should I know about menopause? During menopause, you may get a number of symptoms, such as:  Hot flashes. These can be moderate or severe.  Night sweats.  Decrease in sex drive.  Mood swings.  Headaches.  Tiredness.  Irritability.  Memory problems.  Insomnia. Choosing to treat or not to treat these symptoms is a decision that you make with your health care provider. Do I need hormone replacement therapy?  Hormone replacement therapy is effective in treating symptoms that are caused by menopause, such as hot flashes and night sweats.  Hormone replacement carries certain risks, especially as you become older. If you are thinking about using estrogen or estrogen with progestin, discuss the benefits and risks with your health care provider. What is my risk for heart disease and stroke? The risk of heart disease, heart attack, and stroke increases as you age. One of the causes may be a change in the body's hormones during menopause. This can affect how your body uses dietary fats, triglycerides, and cholesterol. Heart attack and stroke are medical emergencies. There are many things that you can do to help prevent heart disease and stroke. Watch your blood pressure  High  blood pressure causes heart disease and increases the risk of stroke. This is more likely to develop in people who have high blood pressure readings, are of African descent, or are overweight.  Have your blood pressure checked: ? Every 3-5 years if you are 18-39 years of age. ? Every year if you are 40 years old or older. Eat a healthy diet   Eat a diet that includes plenty of vegetables, fruits, low-fat dairy products, and lean protein.  Do not eat a lot of foods that are high in solid fats, added sugars, or sodium. Get regular exercise Get regular exercise. This is one of the most important things you can do for your health. Most adults should:  Try to exercise for at least 150 minutes each week. The exercise should increase your heart rate and make you sweat (moderate-intensity exercise).  Try to do strengthening exercises at least twice each week. Do these in addition to the moderate-intensity exercise.  Spend less time sitting. Even light physical activity can be beneficial. Other tips  Work with your health care provider to achieve or maintain a healthy weight.  Do not use any products that contain nicotine or tobacco, such as cigarettes, e-cigarettes, and chewing tobacco. If you need help quitting, ask your health care provider.  Know your numbers. Ask your health care provider to check your cholesterol and your blood sugar (glucose). Continue to have your blood tested as directed by your health care provider. Do I need screening for cancer? Depending on your health history and family history, you may need to have cancer screening at different stages of your life. This   may include screening for:  Breast cancer.  Cervical cancer.  Lung cancer.  Colorectal cancer. What is my risk for osteoporosis? After menopause, you may be at increased risk for osteoporosis. Osteoporosis is a condition in which bone destruction happens more quickly than new bone creation. To help prevent  osteoporosis or the bone fractures that can happen because of osteoporosis, you may take the following actions:  If you are 65-43 years old, get at least 1,000 mg of calcium and at least 600 mg of vitamin D per day.  If you are older than age 4 but younger than age 16, get at least 1,200 mg of calcium and at least 600 mg of vitamin D per day.  If you are older than age 8, get at least 1,200 mg of calcium and at least 800 mg of vitamin D per day. Smoking and drinking excessive alcohol increase the risk of osteoporosis. Eat foods that are rich in calcium and vitamin D, and do weight-bearing exercises several times each week as directed by your health care provider. How does menopause affect my mental health? Depression may occur at any age, but it is more common as you become older. Common symptoms of depression include:  Low or sad mood.  Changes in sleep patterns.  Changes in appetite or eating patterns.  Feeling an overall lack of motivation or enjoyment of activities that you previously enjoyed.  Frequent crying spells. Talk with your health care provider if you think that you are experiencing depression. General instructions See your health care provider for regular wellness exams and vaccines. This may include:  Scheduling regular health, dental, and eye exams.  Getting and maintaining your vaccines. These include: ? Influenza vaccine. Get this vaccine each year before the flu season begins. ? Pneumonia vaccine. ? Shingles vaccine. ? Tetanus, diphtheria, and pertussis (Tdap) booster vaccine. Your health care provider may also recommend other immunizations. Tell your health care provider if you have ever been abused or do not feel safe at home. Summary  Menopause is a normal process in which your ability to get pregnant comes to an end.  This condition causes hot flashes, night sweats, decreased interest in sex, mood swings, headaches, or lack of sleep.  Treatment for this  condition may include hormone replacement therapy.  Take actions to keep yourself healthy, including exercising regularly, eating a healthy diet, watching your weight, and checking your blood pressure and blood sugar levels.  Get screened for cancer and depression. Make sure that you are up to date with all your vaccines. This information is not intended to replace advice given to you by your health care provider. Make sure you discuss any questions you have with your health care provider. Document Released: 01/08/2006 Document Revised: 11/09/2018 Document Reviewed: 11/09/2018 Elsevier Patient Education  2020 [place] for Diabetes Mellitus, Adult  Carbohydrate counting is a method of keeping track of how many carbohydrates you eat. Eating carbohydrates naturally increases the amount of sugar (glucose) in the blood. Counting how many carbohydrates you eat helps keep your blood glucose within normal limits, which helps you manage your diabetes (diabetes mellitus). It is important to know how many carbohydrates you can safely have in each meal. This is different for every person. A diet and nutrition specialist (registered dietitian) can help you make a meal plan and calculate how many carbohydrates you should have at each meal and snack. Carbohydrates are found in the following foods:  Grains, such as breads and  cereals.  Dried beans and soy products.  Starchy vegetables, such as potatoes, peas, and corn.  Fruit and fruit juices.  Milk and yogurt.  Sweets and snack foods, such as cake, cookies, candy, chips, and soft drinks. How do I count carbohydrates? There are two ways to count carbohydrates in food. You can use either of the methods or a combination of both. Reading "Nutrition Facts" on packaged food The "Nutrition Facts" list is included on the labels of almost all packaged foods and beverages in the [place] It includes:  The serving size.  Information  about nutrients in each serving, including the grams (g) of carbohydrate per serving. To use the "Nutrition Facts":  Decide how many servings you will have.  Multiply the number of servings by the number of carbohydrates per serving.  The resulting number is the total amount of carbohydrates that you will be having. Learning standard serving sizes of other foods When you eat carbohydrate foods that are not packaged or do not include "Nutrition Facts" on the label, you need to measure the servings in order to count the amount of carbohydrates:  Measure the foods that you will eat with a food scale or measuring cup, if needed.  Decide how many standard-size servings you will eat.  Multiply the number of servings by 15. Most carbohydrate-rich foods have about 15 g of carbohydrates per serving. ? For example, if you eat 8 oz (170 g) of strawberries, you will have eaten 2 servings and 30 g of carbohydrates (2 servings x 15 g = 30 g).  For foods that have more than one food mixed, such as soups and casseroles, you must count the carbohydrates in each food that is included. The following list contains standard serving sizes of common carbohydrate-rich foods. Each of these servings has about 15 g of carbohydrates:   hamburger bun or  English muffin.   oz (15 mL) syrup.   oz (14 g) jelly.  1 slice of bread.  1 six-inch tortilla.  3 oz (85 g) cooked rice or pasta.  4 oz (113 g) cooked dried beans.  4 oz (113 g) starchy vegetable, such as peas, corn, or potatoes.  4 oz (113 g) hot cereal.  4 oz (113 g) mashed potatoes or  of a large baked potato.  4 oz (113 g) canned or frozen fruit.  4 oz (120 mL) fruit juice.  4-6 crackers.  6 chicken nuggets.  6 oz (170 g) unsweetened dry cereal.  6 oz (170 g) plain fat-free yogurt or yogurt sweetened with artificial sweeteners.  8 oz (240 mL) milk.  8 oz (170 g) fresh fruit or one small piece of fruit.  24 oz (680 g) popped  popcorn. Example of carbohydrate counting Sample meal  3 oz (85 g) chicken breast.  6 oz (170 g) brown rice.  4 oz (113 g) corn.  8 oz (240 mL) milk.  8 oz (170 g) strawberries with sugar-free whipped topping. Carbohydrate calculation 1. Identify the foods that contain carbohydrates: ? Rice. ? Corn. ? Milk. ? Strawberries. 2. Calculate how many servings you have of each food: ? 2 servings rice. ? 1 serving corn. ? 1 serving milk. ? 1 serving strawberries. 3. Multiply each number of servings by 15 g: ? 2 servings rice x 15 g = 30 g. ? 1 serving corn x 15 g = 15 g. ? 1 serving milk x 15 g = 15 g. ? 1 serving strawberries x 15 g = 15  g. 4. Add together all of the amounts to find the total grams of carbohydrates eaten: ? 30 g + 15 g + 15 g + 15 g = 75 g of carbohydrates total. Summary  Carbohydrate counting is a method of keeping track of how many carbohydrates you eat.  Eating carbohydrates naturally increases the amount of sugar (glucose) in the blood.  Counting how many carbohydrates you eat helps keep your blood glucose within normal limits, which helps you manage your diabetes.  A diet and nutrition specialist (registered dietitian) can help you make a meal plan and calculate how many carbohydrates you should have at each meal and snack. This information is not intended to replace advice given to you by your health care provider. Make sure you discuss any questions you have with your health care provider. Document Released: 11/16/2005 Document Revised: 06/10/2017 Document Reviewed: 04/29/2016 Elsevier Patient Education  2020 [employer].

## 2019-05-30 NOTE — Progress Notes (Signed)
Lisa [name] 10-16-1961 [id number]    History:    Presents for annual exam.  Postmenopausal on no HRT with no bleeding.  Normal Pap history.  Last mammogram 2018 normal history.  Continues to smoke several cigarettes daily is trying to stop.  Medical problems include hypertension, coronary artery disease, diabetes, hypercholesteremia, COPD.  2013 benign colon polyp 5-year follow-up has not been completed.  Past medical history, past surgical history, family history and social history were all reviewed and documented in the EPIC chart.  Currently unemployed.  2 sons ages 102 and 65 both doing okay but overweight.  Completed [employer] school but was unable to find a job.  ROS:  A ROS was performed and pertinent positives and negatives are included.  Exam:  Vitals:   05/30/19 1530  BP: 128/80  Weight: 271 lb (122.9 kg)  Height: 5\' 5"  (1.651 m)   Body mass index is 45.1 kg/m.   General appearance:  Normal Thyroid:  Symmetrical, normal in size, without palpable masses or nodularity. Respiratory  Auscultation:  Clear without wheezing or rhonchi Cardiovascular  Auscultation:  Regular rate, without rubs, murmurs or gallops  Edema/varicosities:  Not grossly evident Abdominal  Soft,nontender, without masses, guarding or rebound.  Liver/spleen:  No organomegaly noted  Hernia:  None appreciated  Skin  Inspection:  Grossly normal   Breasts: Examined lying and sitting. Probable heat rash under both breasts    Right: Without masses, retractions, discharge or axillary adenopathy.     Left: Without masses, retractions, discharge or axillary adenopathy. Gentitourinary   Inguinal/mons:  Normal without inguinal adenopathy  External genitalia:  Normal  BUS/Urethra/Skene's glands:  Normal  Vagina: Mild atrophy cervix:  Normal  Uterus:   normal in size, shape and contour.  Midline and mobile  Adnexa/parametria:     Rt: Without masses or tenderness.   Lt: Without masses or tenderness.  Anus and  perineum: Normal  Digital rectal exam: Normal sphincter tone without palpated masses or tenderness  Assessment/Plan:  58 y.o. MBF G4 P2 for annual exam with complaint  of vaginal dryness with intercourse.  Postmenopausal/no HRT/no bleeding Vaginal atrophy Hypertension, hypercholesteremia, COPD, PAD, primary care and cardiologist manage labs and meds Morbid obesity Smoker-less than half pack daily  Plan: Options reviewed for dryness, will try over-the-counter [place], instructed to call if continued problems.  SBEs, instructed to schedule screening annual mammogram, breast center number given.  Colonoscopy follow-up needed, Dr. Fuller Plan at [facility] instructed to call.  Aware of importance of increasing exercise and decreasing calorie/carbs.  Vitamin D 2000 daily.  Continue daily walking, encouraged balance type exercise like yoga.  Aware of hazards of smoking, tips to quit reviewed.  Pap normal 2019, new screening guidelines reviewed.    [facility], 3:42 PM 05/30/2019

## 2019-06-07 ENCOUNTER — Other Ambulatory Visit: Payer: Self-pay

## 2019-06-07 ENCOUNTER — Ambulatory Visit
Admission: RE | Admit: 2019-06-07 | Discharge: 2019-06-07 | Disposition: A | Discharge status: Home / Self Care | Payer: Managed Care, Other (non HMO) | Source: Ambulatory Visit | Attending: Orthopedic Surgery | Admitting: Orthopedic Surgery

## 2019-06-07 DIAGNOSIS — M47816 Spondylosis without myelopathy or radiculopathy, lumbar region: Secondary | ICD-10-CM

## 2019-06-07 MED ORDER — METHYLPREDNISOLONE ACETATE 40 MG/ML INJ SUSP (RADIOLOG
120.0000 mg | Freq: Once | INTRAMUSCULAR | Status: AC
  Administered 2019-06-07: 120 mg via EPIDURAL

## 2019-06-07 MED ORDER — IOPAMIDOL (ISOVUE-M 200) INJECTION 41%
1.0000 mL | Freq: Once | INTRAMUSCULAR | Status: AC
  Administered 2019-06-07: 1 mL via EPIDURAL

## 2019-06-07 NOTE — Discharge Instructions (Signed)

## 2019-06-15 ENCOUNTER — Ambulatory Visit (INDEPENDENT_AMBULATORY_CARE_PROVIDER_SITE_OTHER): Payer: Managed Care, Other (non HMO) | Admitting: Family Medicine

## 2019-06-15 ENCOUNTER — Encounter: Payer: Self-pay | Admitting: Family Medicine

## 2019-06-15 DIAGNOSIS — M65331 Trigger finger, right middle finger: Secondary | ICD-10-CM

## 2019-06-15 NOTE — Progress Notes (Signed)
Virtual Visit via Video Note  I connected with Lisa [name] on 06/15/19 at 10:30 AM EDT by a video enabled telemedicine application and verified that I am speaking with the correct person using two identifiers.  Location: Patient: Home setting*  Provider: *Office setting   I discussed the limitations of evaluation and management by telemedicine and the availability of in person appointments. The patient expressed understanding and agreed to proceed.  History of Present Illness: Patient was having more of a trigger finger and had an injection back on May 04, 2019.  Patient at this time 98% better patient denies any new symptoms  Observations/Objective: Patient appears to be doing well seems comfortable    Assessment and Plan: Treatment responded well discussed bracing at night, home exercises, which activities to do which wants to avoid.  Follow-up again for as needed and can repeat injection every 3 to 6 months      I discussed the assessment and treatment plan with the patient. The patient was provided an opportunity to ask questions and all were answered. The patient agreed with the plan and demonstrated an understanding of the instructions.   The patient was advised to call back or seek an in-person evaluation if the symptoms worsen or if the condition fails to improve as anticipated.  I provided 15 minutes of face-to-face time during this encounter.   Lyndal Pulley, DO

## 2019-06-16 ENCOUNTER — Other Ambulatory Visit: Payer: Self-pay | Admitting: Family Medicine

## 2019-06-23 ENCOUNTER — Ambulatory Visit: Payer: Managed Care, Other (non HMO) | Admitting: Emergency Medicine

## 2019-06-26 ENCOUNTER — Other Ambulatory Visit: Payer: Self-pay

## 2019-06-26 ENCOUNTER — Ambulatory Visit (INDEPENDENT_AMBULATORY_CARE_PROVIDER_SITE_OTHER): Payer: Managed Care, Other (non HMO) | Admitting: Emergency Medicine

## 2019-06-26 ENCOUNTER — Encounter: Payer: Self-pay | Admitting: Emergency Medicine

## 2019-06-26 DIAGNOSIS — J449 Chronic obstructive pulmonary disease, unspecified: Secondary | ICD-10-CM

## 2019-06-26 DIAGNOSIS — K219 Gastro-esophageal reflux disease without esophagitis: Secondary | ICD-10-CM

## 2019-06-26 DIAGNOSIS — G4730 Sleep apnea, unspecified: Secondary | ICD-10-CM | POA: Diagnosis not present

## 2019-06-26 DIAGNOSIS — J84115 Respiratory bronchiolitis interstitial lung disease: Secondary | ICD-10-CM

## 2019-06-26 DIAGNOSIS — J310 Chronic rhinitis: Secondary | ICD-10-CM

## 2019-06-26 NOTE — Progress Notes (Signed)
Subjective:    Patient ID: Lisa [name], female    DOB: Apr 02, 1961, 58 y.o.   MRN: [id number]  Shortness of Breath Pertinent negatives include no ear pain, fever, headaches, leg swelling, rash, rhinorrhea, sore throat, vomiting or wheezing.   HPI:   ROV 01/16/19 --58 year old woman, history of tobacco and RB ILD with associated COPD that we have followed with serial imaging and pulmonary function testing.  She also has obstructive sleep apnea and uses CPAP.  Most recent imaging was 11/02/2018, showed similar areas of groundglass opacity, stable pulmonary nodules (now deemed benign), possible PAH. Since last time she had a URI, developed cough, was treated by her PCP with azithro and then prednisone. Little change. Her tobacco is now back up to 20 cig a day. She remains on buproprion. She is on protonix qd. She is coughing up clear thick mucous. Some mild allergy sx. She ran out her albuterol a few months ago.  ROV 06/26/2019 --[name] follows up for her history of tobacco use that associated with RB-ILD and COPD.  She also has OSA on CPAP.  I saw her in February after she was treated by her PCP for an acute flare.  She had continued cough, mild allergy symptoms, on Protonix.  She was smoking 20 cigarettes daily at the time.  I treated her with prednisone for a flare of her cough, try to ramp up her Protonix to twice a day, added loratadine.  She reports now that she has continued to smoke about 2 pks a day (increased). She is now off protonix altogether, off loratadine. She has albuterol, has not required. Good compliance w her CPAP machine. Good clinical benefit.   Review of Systems  Constitutional: Negative for fever and unexpected weight change.  HENT: Negative for congestion, dental problem, ear pain, nosebleeds, postnasal drip, rhinorrhea, sinus pressure, sneezing, sore throat and trouble swallowing.   Eyes: Negative for redness and itching.  Respiratory: Positive for cough. Negative for  chest tightness, shortness of breath and wheezing.   Cardiovascular: Negative for palpitations and leg swelling.  Gastrointestinal: Negative for nausea and vomiting.  Genitourinary: Negative for dysuria.  Musculoskeletal: Negative for joint swelling.  Skin: Negative for rash.  Neurological: Negative for headaches.  Hematological: Does not bruise/bleed easily.  Psychiatric/Behavioral: Negative for dysphoric mood. The patient is not nervous/anxious.        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  Most recent imaging 10/2018.  I do not expect there is been any significant radiographical improvement given the fact that she is still smoking.  All the same she is clinically stable and I do not believe she needs reimaging at this time.  COPD (chronic obstructive pulmonary disease) ([place]) Discussed smoking cessation with her in detail.  Goal is for [name] to get her  back down to 1 pack daily and then work on decreasing serially until ready to set a quit date.  She using albuterol as needed, uses very rarely at this time.  Will not add scheduled bronchodilator at this time  Sleep apnea Good compliance and good clinical benefit from her CPAP.  GERD (gastroesophageal reflux disease) Not currently on therapy.  I had increased her PPI to twice daily for 2  weeks, she has since come off of it altogether.  Appears to be tolerating  Chronic rhinitis Not currently on an allergy regimen.  May need to add this back later depending on how active her cough becomes.  Baltazar Apo, MD, PhD 06/26/2019, 4:46 PM [facility] [phone] or if no answer [phone]

## 2019-06-26 NOTE — Assessment & Plan Note (Signed)
History of RB ILD with groundglass infiltrates.  Most recent imaging 10/2018.  I do not expect there is been any significant radiographical improvement given the fact that she is still smoking.  All the same she is clinically stable and I do not believe she needs reimaging at this time.

## 2019-06-26 NOTE — Assessment & Plan Note (Signed)
Not currently on therapy.  I had increased her PPI to twice daily for 2 weeks, she has since come off of it altogether.  Appears to be tolerating

## 2019-06-26 NOTE — Patient Instructions (Signed)
Keep your albuterol available to use 2 puffs if needed for shortness of breath, chest tightness, wheezing. Depending on how your cough progresses we may need to restart your Protonix or allergy medications at some point in the future.  It is okay to stay off of these for now. The most important thing that you can do is to continue to work on decreasing your cigarettes.  This will affect your health in numerous ways, especially your lung health. Follow with Dr Lamonte Sakai in 6 months or sooner if you have any problems

## 2019-06-26 NOTE — Assessment & Plan Note (Signed)
Not currently on an allergy regimen.  May need to add this back later depending on how active her cough becomes.

## 2019-06-26 NOTE — Assessment & Plan Note (Signed)
Good compliance and good clinical benefit from her CPAP.

## 2019-06-26 NOTE — Assessment & Plan Note (Signed)
Discussed smoking cessation with her in detail.  Goal is for [name] to get her back down to 1 pack daily and then work on decreasing serially until ready to set a quit date.  She using albuterol as needed, uses very rarely at this time.  Will not add scheduled bronchodilator at this time

## 2019-07-12 ENCOUNTER — Other Ambulatory Visit: Payer: Self-pay | Admitting: Family Medicine

## 2019-08-09 ENCOUNTER — Other Ambulatory Visit: Payer: Managed Care, Other (non HMO)

## 2019-08-10 ENCOUNTER — Other Ambulatory Visit: Payer: Self-pay

## 2019-08-10 ENCOUNTER — Other Ambulatory Visit: Payer: Managed Care, Other (non HMO) | Admitting: *Deleted

## 2019-08-10 DIAGNOSIS — I2510 Atherosclerotic heart disease of native coronary artery without angina pectoris: Secondary | ICD-10-CM

## 2019-08-10 DIAGNOSIS — E785 Hyperlipidemia, unspecified: Secondary | ICD-10-CM

## 2019-08-10 DIAGNOSIS — I2584 Coronary atherosclerosis due to calcified coronary lesion: Secondary | ICD-10-CM

## 2019-08-10 LAB — LIPID PANEL
Chol/HDL Ratio: 5.4 ratio — ABNORMAL HIGH (ref 0.0–4.4)
Cholesterol, Total: 135 mg/dL (ref 100–199)
HDL: 25 mg/dL — ABNORMAL LOW (ref >39)
LDL Chol Calc (NIH): 82 mg/dL (ref 0–99)
Triglycerides: 162 mg/dL — ABNORMAL HIGH (ref 0–149)
VLDL Cholesterol Cal: 28 mg/dL (ref 5–40)

## 2019-08-10 LAB — HEPATIC FUNCTION PANEL
ALT: 20 IU/L (ref 0–32)
AST: 11 IU/L (ref 0–40)
Albumin: 4.2 g/dL (ref 3.8–4.9)
Alkaline Phosphatase: 88 IU/L (ref 39–117)
Bilirubin Total: 0.3 mg/dL (ref 0.0–1.2)
Bilirubin, Direct: 0.09 mg/dL (ref 0.00–0.40)
Total Protein: 7 g/dL (ref 6.0–8.5)

## 2019-08-11 ENCOUNTER — Telehealth: Payer: Self-pay

## 2019-08-11 DIAGNOSIS — I2584 Coronary atherosclerosis due to calcified coronary lesion: Secondary | ICD-10-CM

## 2019-08-11 DIAGNOSIS — I2510 Atherosclerotic heart disease of native coronary artery without angina pectoris: Secondary | ICD-10-CM

## 2019-08-11 DIAGNOSIS — I152 Hypertension secondary to endocrine disorders: Secondary | ICD-10-CM

## 2019-08-11 DIAGNOSIS — E785 Hyperlipidemia, unspecified: Secondary | ICD-10-CM

## 2019-08-11 DIAGNOSIS — E1159 Type 2 diabetes mellitus with other circulatory complications: Secondary | ICD-10-CM

## 2019-08-11 DIAGNOSIS — Z79899 Other long term (current) drug therapy: Secondary | ICD-10-CM

## 2019-08-11 DIAGNOSIS — I739 Peripheral vascular disease, unspecified: Secondary | ICD-10-CM

## 2019-08-11 MED ORDER — EZETIMIBE 10 MG PO TABS: 10.0000 mg | ORAL_TABLET | Freq: Every day | ORAL | 3 refills | Status: DC

## 2019-08-11 NOTE — Telephone Encounter (Signed)
-----   Message from Liliane Shi, [place] sent at 08/10/2019  8:22 PM EDT ----- Triglycerides mildly elevated.  LDL slightly above goal (goal < 70).  HDL is low.  LFTs normal.  PLAN:   -Continue Rosuvastatin 10 mg once daily   -Add Zetia 10 mg Once daily   -Fasting Lipids and LFTs in 3 mos.  Richardson Dopp, PA-C    08/10/2019 8:19 PM

## 2019-08-11 NOTE — Telephone Encounter (Signed)
Pt verbalized understanding of her lab results.. will return 10/2019 for repeat fasting labs 10/2019

## 2019-08-15 ENCOUNTER — Other Ambulatory Visit: Payer: Self-pay

## 2019-08-15 ENCOUNTER — Ambulatory Visit
Admission: EM | Admit: 2019-08-15 | Discharge: 2019-08-15 | Disposition: A | Discharge status: Home / Self Care | Payer: Managed Care, Other (non HMO) | Attending: Emergency Medicine | Admitting: Emergency Medicine

## 2019-08-15 ENCOUNTER — Encounter: Payer: Self-pay | Admitting: Emergency Medicine

## 2019-08-15 ENCOUNTER — Ambulatory Visit: Payer: Self-pay | Admitting: *Deleted

## 2019-08-15 DIAGNOSIS — R1030 Lower abdominal pain, unspecified: Secondary | ICD-10-CM | POA: Diagnosis not present

## 2019-08-15 LAB — POCT URINALYSIS DIP (MANUAL ENTRY)
Bilirubin, UA: NEGATIVE
Glucose, UA: NEGATIVE mg/dL
Ketones, POC UA: NEGATIVE mg/dL
Leukocytes, UA: NEGATIVE
Nitrite, UA: NEGATIVE
Protein Ur, POC: NEGATIVE mg/dL
Spec Grav, UA: 1.025 (ref 1.010–1.025)
Urobilinogen, UA: 0.2 E.U./dL
pH, UA: 5.5 (ref 5.0–8.0)

## 2019-08-15 NOTE — Telephone Encounter (Signed)
Patient is calling because she had extreme abdominal pain last night- she is still having the pain- but it has calmed to a moderate level. Per protocol- patient needs to be seen within a 4 hour period. Call to office and they can not see her until later this afternoon- advised UC. Patient will go.  Reason for Disposition . [1] MILD-MODERATE pain AND [2] constant AND [3] present > 2 hours  Answer Assessment - Initial Assessment Questions 1. LOCATION: "Where does it hurt?"      Lower abdominal pain at middle of lower area- hairline 2. RADIATION: "Does the pain shoot anywhere else?" (e.g., chest, back)     No-last night- this morning pain was present and slightly to the right 3. ONSET: "When did the pain begin?" (e.g., minutes, hours or days ago)      Last night after urination 4. SUDDEN: "Gradual or sudden onset?"     Sudden onset 5. PATTERN "Does the pain come and go, or is it constant?"    - If constant: "Is it getting better, staying the same, or worsening?"      (Note: Constant means the pain never goes away completely; most serious pain is constant and it progresses)     - If intermittent: "How long does it last?" "Do you have pain now?"     (Note: Intermittent means the pain goes away completely between bouts)     constant 6. SEVERITY: "How bad is the pain?"  (e.g., Scale 1-10; mild, moderate, or severe)   - MILD (1-3): doesn't interfere with normal activities, abdomen soft and not tender to touch    - MODERATE (4-7): interferes with normal activities or awakens from sleep, tender to touch    - SEVERE (8-10): excruciating pain, doubled over, unable to do any normal activities      Subsided some today- was 11 last night now- 6 7. RECURRENT SYMPTOM: "Have you ever had this type of abdominal pain before?" If so, ask: "When was the last time?" and "What happened that time?"      Yes- this is slightly different no pain with urination 8. CAUSE: "What do you think is causing the abdominal  pain?"     Not sure 9. RELIEVING/AGGRAVATING FACTORS: "What makes it better or worse?" (e.g., movement, antacids, bowel movement)     no 10. OTHER SYMPTOMS: "Has there been any vomiting, diarrhea, constipation, or urine problems?"       Pain is still present- no pain after urination 11. PREGNANCY: "Is there any chance you are pregnant?" "When was your last menstrual period?"       n/a  Protocols used: ABDOMINAL PAIN - [facility]

## 2019-08-15 NOTE — ED Triage Notes (Signed)
Pt presents to [facility] for assessment of lower mid abdominal starting last night after urinating.  States the pain was 10/10 last night, but she was able to go to bed.  States this morning it was down to 6/10.  Patient has urinated since without increase in pain, pain is down to 2/10.  Hx of umbilical hernia.

## 2019-08-15 NOTE — ED Provider Notes (Signed)
[name]    CSN: [id number] Arrival date & time: 08/15/19  1158      History   Chief Complaint Chief Complaint  Patient presents with  . Abdominal Pain    HPI MAJEL MELO is a 58 y.o. female history of hypertension, diastolic congestive heart failure, asthma, allergic rhinitis, type 2 diabetes, sleep apnea presenting for lower abdominal pain that started after urinating last night.  Patient states that it was like a severe achy crampy feeling.  Denies hematuria, burning with urination, urinary frequency.  Did not take any thing for her pain.  States this morning her pain had significantly improved and prior to arrival her pain is now down to a 2/10.  Patient endorsing history of fibroids; has undergone endometrial ablation in the past.  Denies vaginal bleeding, change in bowel habit.  No pain with defecation, hematochezia, melena.  Patient does have history of IBS: States she has appointment with GI next week.  Patient denies history of renal calculi.  Does endorse history of chronic hematuria, for which she has gone under 2 cystoscopies for with her urologist that were unremarkable.   Past Medical History:  Diagnosis Date  . Allergic rhinitis   . Asthma   . Breast nodule    right breast, to see dr Helane Rima 06-20-2012 for   . Chest pain, non-cardiac   . Chronic headaches   . Cigarette smoker   . Complication of anesthesia 05-17-2012   trouble breathing after colonscopy, needed nebulizer  . Diastolic congestive heart failure ([place]) 04/17/2019  . Diverticulosis   . GERD (gastroesophageal reflux disease)   . Hepatic hemangioma   . Hypertension   . IBS (irritable bowel syndrome)   . Nuclear stress test    Myoview 05/2019: EF 63, no ischemia. Low Risk  . Obesity   . Prosthetic eye globe   . Pyloric erosion   . Sleep apnea    CPAP setting varies from 4-10  . Type 2 diabetes mellitus ([place]) 03/11/2017    Patient Active Problem List   Diagnosis Date Noted  .  Chronic rhinitis 06/26/2019  . Trigger finger, right middle finger 05/04/2019  . Coronary artery calcification 04/20/2019  . Diastolic congestive heart failure ([place]) 04/17/2019  . Bilateral leg pain 04/14/2018  . Pulmonary infiltrates   . S/P bronchoscopy with biopsy   . Hyperlipidemia associated with type 2 diabetes mellitus ([place]) 09/06/2017  . Breast mass 08/03/2017  . Chronic hip pain 07/29/2017  . Type 2 diabetes mellitus ([place]) 03/11/2017  . PAD (peripheral artery disease) ([place]) 01/19/2017  . 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 Left knee pain 05/15/2016  . Flank pain 04/21/2016  . Abdominal pain 02/28/2016  . GERD (gastroesophageal reflux disease) 02/14/2016  . Morbid obesity ([place]) 02/14/2016  . Tobacco use disorder 08/12/2015  . Pulmonary nodule, right 04/11/2015  . COPD (chronic obstructive pulmonary disease) ([place]) 01/18/2015  . Mediastinal lymphadenopathy 01/18/2015  . DOE (dyspnea on exertion) 03/08/2014  . Migraine without aura 07/26/2013  . Sleep apnea 02/11/2012  . Chest discomfort 10/12/2011  . Hypertension associated with diabetes ([place]) 10/12/2011    Past Surgical History:  Procedure Laterality Date  . ABDOMINAL AORTOGRAM W/LOWER EXTREMITY N/A 04/30/2017   Procedure: Abdominal Aortogram w/Lower Extremity;  Surgeon: Elam Dutch, MD;  Location: [facility];  Service: Cardiovascular;  Laterality: N/A;  . CARDIAC CATHETERIZATION  12/28/2007   EF 75%. IT REVEALS NORMAL/SUPRANORMAL LEFT VENTRICULAR SYSTOLIC FUNCTION  . CARDIOVASCULAR STRESS  TEST  03/25/2007   EF 78%  . CESAREAN SECTION  2003, 2009   x 2  . CHOLECYSTECTOMY  1993  . COLONOSCOPY  2014  . ENDOMETRIAL ABLATION  09/2010   and D&C  . HERNIA REPAIR  [id number]   umbilical  . LAPAROSCOPY    . PERIPHERAL VASCULAR BALLOON ANGIOPLASTY Left 04/30/2017   Procedure: Peripheral Vascular Balloon Angioplasty;  Surgeon: Elam Dutch, MD;  Location: [facility];  Service: Cardiovascular;  Laterality: Left;  SFA  . TUBAL LIGATION    . UMBILICAL HERNIA REPAIR  2004  . US ECHOCARDIOGRAPHY  05/08/2008   EF 55-60%  . VENTRAL HERNIA REPAIR  06/15/2012   Procedure: LAPAROSCOPIC VENTRAL HERNIA;  Surgeon: Edward Jolly, MD;  Location: [facility];  Service: General;  Laterality: N/A;  LAPAROSCOPIC REPAIR VENTRAL HERNIA  . VIDEO BRONCHOSCOPY Bilateral 11/15/2017   Procedure: VIDEO BRONCHOSCOPY WITH FLUORO;  Surgeon: Collene Gobble, MD;  Location: [facility];  Service: Cardiopulmonary;  Laterality: Bilateral;    OB History    Gravida  4   Para  2   Term  2   Preterm      AB  2   Living  2     SAB      TAB      Ectopic  1   Multiple      Live Births               Home Medications    Prior to Admission medications   Medication Sig Start Date End Date Taking? Authorizing Provider  albuterol (PROVENTIL HFA;VENTOLIN HFA) 108 (90 Base) MCG/ACT inhaler Inhale 2 puffs into the lungs every 6 (six) hours as needed for wheezing or shortness of breath. 07/30/17   Collene Gobble, MD  aspirin EC 81 MG tablet Take 81 mg by mouth daily.    [provider]  atorvastatin (LIPITOR) 40 MG tablet TAKE 1 TABLET (40 MG TOTAL) BY MOUTH DAILY. 06/16/19   Marin Olp, MD  buPROPion (WELLBUTRIN SR) 150 MG 12 hr tablet Take 1 tablet (150 mg total) by mouth 2 (two) times daily. 04/04/19   Collene Gobble, MD  carvedilol (COREG) 25 MG tablet TAKE 1 TABLET BY MOUTH TWICE A DAY 04/04/19   Nahser, Wonda Cheng, MD  diclofenac sodium (VOLTAREN) 1 % GEL Apply 2-4 g topically 3 (three) times daily. 03/30/19   Cherylann Ratel, PA-C  dicyclomine (BENTYL) 10 MG capsule Take 1 capsule (10 mg total) by mouth 3 (three) times daily before meals. As needed 08/05/18   Ladene Artist, MD  ezetimibe (ZETIA) 10 MG tablet Take 1 tablet (10 mg total) by mouth daily. 08/11/19   Richardson Dopp T, PA-C  nystatin-triamcinolone ointment (MYCOLOG) Apply 1 application topically  2 (two) times daily. 05/30/19   Huel Cote, NP  ONE TOUCH LANCETS MISC Use to check blood sugars twice a day 12/20/17   Lacy Duverney M, PA-C  OZEMPIC, 0.25 OR 0.5 MG/DOSE, 2 MG/1.5ML SOPN INJECT 0.5 MG INTO THE SKIN ONCE A WEEK. 09/16/18   Marin Olp, MD  pantoprazole (PROTONIX) 40 MG tablet Take 1 tablet (40 mg total) by mouth daily. 07/28/18   Ladene Artist, MD  rosuvastatin (CRESTOR) 10 MG tablet Take 1 tablet (10 mg total) by mouth daily. 05/05/19 04/29/20  Richardson Dopp T, PA-C  spironolactone (ALDACTONE) 50 MG tablet TAKE 1 TABLET BY MOUTH EVERY DAY 06/16/19   Marin Olp, MD  telmisartan (MICARDIS) 80 MG tablet Take 1 tablet (80 mg total) by mouth daily. UNTIL LOSARTAN IS OFF BACKORDER. 07/12/19   Marin Olp, MD  traMADol (ULTRAM) 50 MG tablet Take 1 tablet (50 mg total) by mouth every 6 (six) hours as needed. 07/15/18   Long, Wonda Olds, MD    Family History Family History  Problem Relation Age of Onset  . Hypertension Mother   . Lung cancer Mother        36  . Diabetes Maternal Grandmother   . Heart disease Father        pacemaker or defibrillator  . Heart attack Paternal Uncle   . Breast cancer Other   . Colon cancer Neg Hx     Social History Social History   Tobacco Use  . Smoking status: Current Every Day Smoker    Packs/day: 1.50    Years: 38.00    Pack years: 57.00    Types: Cigarettes  . Smokeless tobacco: Never Used  . Tobacco comment: 10 cigarettes per day  Substance Use Topics  . Alcohol use: No    Alcohol/week: 0.0 standard drinks  . Drug use: No     Allergies   Almond oil, Apple, Ceftin [cefuroxime], Cherry extract, Other, Guaifenesin & derivatives, Hctz [hydrochlorothiazide], Morphine and related, Naproxen, and Lisinopril   Review of Systems Review of Systems  Constitutional: Negative for fatigue and fever.  HENT: Negative for ear pain, sinus pain, sore throat and voice change.   Eyes: Negative for pain, redness and visual  disturbance.  Respiratory: Negative for cough and shortness of breath.   Cardiovascular: Negative for chest pain and palpitations.  Gastrointestinal: Positive for abdominal pain. Negative for abdominal distention, anal bleeding, blood in stool, constipation, diarrhea, nausea, rectal pain and vomiting.  Genitourinary: Negative for dysuria, flank pain, frequency, hematuria, pelvic pain, urgency, vaginal bleeding, vaginal discharge and vaginal pain.  Musculoskeletal: Negative for arthralgias and myalgias.  Skin: Negative for rash and wound.  Neurological: Negative for syncope and headaches.      Physical Exam Triage Vital Signs ED Triage Vitals  Enc Vitals Group     BP      Pulse      Resp      Temp      Temp src      SpO2      Weight      Height      Head Circumference      Peak Flow      Pain Score      Pain Loc      Pain Edu?      Excl. in [place]?    No data found.  Updated Vital Signs BP 130/70 (BP Location: Left Arm)   Pulse 85   Temp 98 F (36.7 C) (Oral)   Resp 18   SpO2 98%    Physical Exam Constitutional:      General: She is not in acute distress.    Appearance: She is well-developed. She is obese. She is not ill-appearing.  HENT:     Head: Normocephalic and atraumatic.     Mouth/Throat:     Mouth: Mucous membranes are moist.     Pharynx: Oropharynx is clear.  Eyes:     General: No scleral icterus.    Pupils: Pupils are equal, round, and reactive to light.  Cardiovascular:     Rate and Rhythm: Normal rate and regular rhythm.     Heart sounds: No murmur. No gallop.  Pulmonary:     Effort: Pulmonary effort is normal. No respiratory distress.     Breath sounds: No wheezing, rhonchi or rales.  Abdominal:     General: Abdomen is protuberant. A surgical scar is present. Bowel sounds are normal. There is no distension or abdominal bruit.     Palpations: Abdomen is soft.     Tenderness: There is no abdominal tenderness. There is no right CVA tenderness, left  CVA tenderness, guarding or rebound. Negative signs include Rovsing's sign and McBurney's sign.     Comments: Diastases recti noted  Skin:    Coloration: Skin is not jaundiced or pale.  Neurological:     Mental Status: She is alert and oriented to person, place, and time.      UC Treatments / Results  Labs (all labs ordered are listed, but only abnormal results are displayed) Labs Reviewed  POCT URINALYSIS DIP (MANUAL ENTRY) - Abnormal; Notable for the following components:      Result Value   Blood, UA moderate (*)    All other components within normal limits    EKG   Radiology No results found.  Procedures Procedures (including critical care time)  Medications Ordered in UC Medications - No data to display  Initial Impression / Assessment and Plan / UC Course  I have reviewed the triage vital signs and the nursing notes.  Pertinent labs & imaging results that were available during my care of the patient were reviewed by me and considered in my medical decision making (see chart for details).     1.  Lower abdominal pain POCT urinalysis done in office, reviewed by me: Positive for moderate blood.  Patient to follow-up with PCP for further surveillance.  [place] pain appears to be self-limited at this time: We will continue to monitor.  Discussed conservative management as listed below.  Return precautions discussed, patient verbalized understanding and is agreeable to plan.  Final Clinical Impressions(s) / UC Diagnoses   Final diagnoses:  Lower abdominal pain     Discharge Instructions     Try colonic massage, heating pad, ibuprofen as needed. Important to do short walks at the day, drink plenty of water. Take all chronic medications as prescribed. Go to ER for further evaluation if you develop severe pain, blood in your urine or stool, fever, vomiting.  Keep appoint with GI: Important to keep symptom log in the interim as discussed.    ED Prescriptions     None     Controlled Substance Prescriptions [place] Controlled Substance Registry consulted? Not Applicable   Quincy Sheehan, [place] 08/15/19 1823

## 2019-08-15 NOTE — ED Notes (Signed)
Patient able to ambulate independently  

## 2019-08-15 NOTE — Telephone Encounter (Signed)
Patient is at UC.

## 2019-08-15 NOTE — Discharge Instructions (Signed)
Try colonic massage, heating pad, ibuprofen as needed. Important to do short walks at the day, drink plenty of water. Take all chronic medications as prescribed. Go to ER for further evaluation if you develop severe pain, blood in your urine or stool, fever, vomiting.  Keep appoint with GI: Important to keep symptom log in the interim as discussed.

## 2019-08-18 ENCOUNTER — Other Ambulatory Visit: Payer: Self-pay

## 2019-08-18 ENCOUNTER — Ambulatory Visit (INDEPENDENT_AMBULATORY_CARE_PROVIDER_SITE_OTHER): Payer: Managed Care, Other (non HMO) | Admitting: Nurse Practitioner

## 2019-08-18 ENCOUNTER — Encounter: Payer: Self-pay | Admitting: Nurse Practitioner

## 2019-08-18 VITALS — HR 76 | Temp 98.5°F | Ht 65.0 in | Wt 277.0 lb

## 2019-08-18 DIAGNOSIS — K589 Irritable bowel syndrome without diarrhea: Secondary | ICD-10-CM | POA: Diagnosis not present

## 2019-08-18 DIAGNOSIS — R1011 Right upper quadrant pain: Secondary | ICD-10-CM

## 2019-08-18 MED ORDER — METHOCARBAMOL 500 MG PO TABS: 500.0000 mg | ORAL_TABLET | Freq: Every day | ORAL | 0 refills | Status: DC

## 2019-08-18 NOTE — Progress Notes (Signed)
Chief Complaint:    RUQ pain since July  IMPRESSION and PLAN:     58 year old female with RUQ pain radiating through to back since July.  Pain burning, she can pinpoint the area with 1 finger.  No lesions to suggest Zoster. Pain not related to food, BMs, movement nor position.  She is post cholecystectomy in the 1990s.  LFTs are normal.  Low suspicion of this being GI related pain.  Muscular?  Neuropathic? -Trial of salon pause lidocaine patches to the area.  Use as directed for a week.  - add Robaxin 500 mg.  She will take this only once a day as it sounds like she has responsibilities which include driving and taking care of kids.  Will take it in the afternoon when her husband gets home from work -Tried to reassure patient.  If pain does not improve with above then consider abdominal imaging.  She will call us with an update in about 10 days  HPI:     Patient is a 58 yo female with PMH significant for obesity, hypertension, diastolic CHF, asthma, DM 2 and sleep apnea known to Dr. Fuller Plan for hx of GERD, IBS, she saw him last October 2019 for evaluation of multiple complaints, please refer to that dictation  Patient seen at urgent care 08/15/2019 for lower abdominal pain.  Blood found in urine, she apparently has a history of hematuria and has already been evaluated by urology. Lisa [name] has been having RUQ pain since around July.  Pain is nearly constant, present almost every day.  Pain is burning / hot poker like and very localized within the RUQ but with radiation straight through to her back.  It is not related to eating, bowel movements, activity nor position.  She is status post cholecystectomy in the 1990s.  Liver test at urgent care were normal.  She takes daily PPI, on a daily baby aspirin but no other NSAIDs.  Her weight is up a few pounds . Patient is worried about her pancreas, had a friend who apparently had pancreatic cancer.  Patient had kids late in life, she wants to be here to  see them grow up.   Lisa [name] has IBS, she has chronic postprandial diarrhea, this has not changed.  Review of systems:     No chest pain, no SOB, no fevers, no urinary sx   Past Medical History:  Diagnosis Date   Allergic rhinitis    Asthma    Breast nodule    right breast, to see dr Lisa [name] 06-20-2012 for    Chest pain, non-cardiac    Chronic headaches    Cigarette smoker    Complication of anesthesia 05-17-2012   trouble breathing after colonscopy, needed nebulizer   Diastolic congestive heart failure ([place]) 04/17/2019   Diverticulosis    GERD (gastroesophageal reflux disease)    Hepatic hemangioma    Hypertension    IBS (irritable bowel syndrome)    Nuclear stress test    Myoview 05/2019: EF 63, no ischemia. Low Risk   Obesity    Prosthetic eye globe    Pyloric erosion    Sleep apnea    CPAP setting varies from 4-10   Type 2 diabetes mellitus ([place]) 03/11/2017    Patient's surgical history, family medical history, social history, medications and allergies were all reviewed in Epic   Creatinine clearance cannot be calculated (Patient's most recent lab result is older than the maximum 21 days allowed.)  Current Outpatient  Medications  Medication Sig Dispense Refill   albuterol (PROVENTIL HFA;VENTOLIN HFA) 108 (90 Base) MCG/ACT inhaler Inhale 2 puffs into the lungs every 6 (six) hours as needed for wheezing or shortness of breath. 1 Inhaler 3   aspirin EC 81 MG tablet Take 81 mg by mouth daily.     buPROPion (WELLBUTRIN SR) 150 MG 12 hr tablet Take 1 tablet (150 mg total) by mouth 2 (two) times daily. 180 tablet 1   carvedilol (COREG) 25 MG tablet TAKE 1 TABLET BY MOUTH TWICE A DAY 180 tablet 0   diclofenac sodium (VOLTAREN) 1 % GEL Apply 2-4 g topically 3 (three) times daily. 5 Tube 1   dicyclomine (BENTYL) 10 MG capsule Take 1 capsule (10 mg total) by mouth 3 (three) times daily before meals. As needed 90 capsule 11   ezetimibe (ZETIA) 10 MG tablet Take 1  tablet (10 mg total) by mouth daily. 90 tablet 3   nystatin-triamcinolone ointment (MYCOLOG) Apply 1 application topically 2 (two) times daily. 60 g 0   ONE TOUCH LANCETS MISC Use to check blood sugars twice a day 200 each 0   OZEMPIC, 0.25 OR 0.5 MG/DOSE, 2 MG/1.5ML SOPN INJECT 0.5 MG INTO THE SKIN ONCE A WEEK. 3 pen 8   pantoprazole (PROTONIX) 40 MG tablet Take 1 tablet (40 mg total) by mouth daily. 30 tablet 11   rosuvastatin (CRESTOR) 10 MG tablet Take 1 tablet (10 mg total) by mouth daily. 30 tablet 11   spironolactone (ALDACTONE) 50 MG tablet TAKE 1 TABLET BY MOUTH EVERY DAY 90 tablet 1   telmisartan (MICARDIS) 80 MG tablet Take 1 tablet (80 mg total) by mouth daily. UNTIL LOSARTAN IS OFF BACKORDER. 90 tablet 0   traMADol (ULTRAM) 50 MG tablet Take 1 tablet (50 mg total) by mouth every 6 (six) hours as needed. 15 tablet 0   No current facility-administered medications for this visit.     Physical Exam:     Pulse 76    Temp 98.5 F (36.9 C)    Ht 5\' 5"  (1.651 m)    Wt 277 lb (125.6 kg)    BMI 46.10 kg/m   GENERAL:  Pleasant female in NAD PSYCH: : Cooperative, normal affect EENT:  conjunctiva pink, mucous membranes moist, neck supple without masses CARDIAC:  RRR, no murmur heard, no peripheral edema PULM: Normal respiratory effort, lungs CTA bilaterally, no wheezing ABDOMEN:  Nondistended, soft, localized tenderness to small area just below rib cage in RUQ.  No obvious masses, no hepatomegaly,  normal bowel sounds SKIN:  turgor, no lesions seen Musculoskeletal:  Normal muscle tone, normal strength NEURO: Alert and oriented x 3, no focal neurologic deficits   Tye Savoy , NP 08/18/2019, 10:17 AM

## 2019-08-18 NOTE — Patient Instructions (Signed)
If you are age 56 or older, your body mass index should be between 23-30. Your Body mass index is 46.1 kg/m. If this is out of the aforementioned range listed, please consider follow up with your Primary Care Provider.  If you are age 21 or younger, your body mass index should be between 19-25. Your Body mass index is 46.1 kg/m. If this is out of the aformentioned range listed, please consider follow up with your Primary Care Provider.   We have sent the following medications to your pharmacy for you to pick up at your convenience: Robaxin  Try Salonpas every 8 hours - off 8 hours then on again for one week.  (this is over-the-counter)  Call in 10 days with an update.  Thank you for choosing me and [facility].   Tye Savoy, NP

## 2019-08-21 NOTE — Progress Notes (Signed)
Reviewed and agree with management plan.  Zyrion Coey T. Stellan Vick, MD FACG [facility]  

## 2019-08-23 ENCOUNTER — Telehealth: Payer: Self-pay | Admitting: Emergency Medicine

## 2019-08-23 ENCOUNTER — Other Ambulatory Visit: Payer: Self-pay | Admitting: Emergency Medicine

## 2019-08-23 DIAGNOSIS — Z20822 Contact with and (suspected) exposure to covid-19: Secondary | ICD-10-CM

## 2019-08-23 NOTE — Telephone Encounter (Signed)
Pt. Needs COVID testing.  Please place an order for testing and instruct her on where to go to get tested.She will need to quarantine while awaiting the test results.  She needs to go to the ED if she develops worsening shortness of breath, or worsening of symptoms. Thanks

## 2019-08-23 NOTE — Telephone Encounter (Signed)
Primary Pulmonologist: Dr. Lamonte Sakai Last office visit and with whom: COPD What do we see them for (pulmonary problems): 05/2019   Reason for call: Pt states that she started having symptoms of COVID. Reports body aches, chills, coughing, chest tightness and shortness of breath. She denies any sick contacts that she knows of or recent travel.  In the last month, have you been in contact with someone who was confirmed or suspected to have [place]avirus / COVID-19?  No  Do you have any of the following symptoms developed in the last 30 days? Fever: No Cough: Yes Shortness of breath: Yes  When did your symptoms start?  Monday, 08/21/2019  If the patient has a fever, what is the last reading?  (use n/a if patient denies fever)  N/A  Judson Roch - please advise. Thanks.

## 2019-08-23 NOTE — Telephone Encounter (Signed)
Called spoke with patient. Gave her the location for testing and placed the order.   Instructed patient to quarantine. Nothing further needed at this time.

## 2019-08-25 LAB — NOVEL CORONAVIRUS, NAA: SARS-CoV-2, NAA: NOT DETECTED

## 2019-08-29 NOTE — Progress Notes (Signed)
Pt already aware per mychart. Also left message normal lab.

## 2019-08-31 ENCOUNTER — Telehealth: Payer: Self-pay | Admitting: Emergency Medicine

## 2019-09-01 ENCOUNTER — Other Ambulatory Visit: Payer: Self-pay

## 2019-09-01 ENCOUNTER — Ambulatory Visit (INDEPENDENT_AMBULATORY_CARE_PROVIDER_SITE_OTHER): Payer: Managed Care, Other (non HMO)

## 2019-09-01 DIAGNOSIS — Z23 Encounter for immunization: Secondary | ICD-10-CM

## 2019-09-01 NOTE — Telephone Encounter (Signed)
Spoke with pt. She is scheduled for her flu shot today at 1145. Nothing further is needed.

## 2019-10-03 ENCOUNTER — Other Ambulatory Visit: Payer: Self-pay | Admitting: Family Medicine

## 2019-10-09 ENCOUNTER — Other Ambulatory Visit: Payer: Self-pay | Admitting: Gastroenterology

## 2019-10-11 ENCOUNTER — Encounter: Payer: Self-pay | Admitting: Family Medicine

## 2019-10-11 ENCOUNTER — Other Ambulatory Visit: Payer: Self-pay | Admitting: Family Medicine

## 2019-10-11 ENCOUNTER — Ambulatory Visit (INDEPENDENT_AMBULATORY_CARE_PROVIDER_SITE_OTHER): Payer: Managed Care, Other (non HMO) | Admitting: Family Medicine

## 2019-10-11 VITALS — BP 130/88 | HR 77 | Temp 98.0°F | Ht 65.0 in | Wt 276.2 lb

## 2019-10-11 DIAGNOSIS — I5030 Unspecified diastolic (congestive) heart failure: Secondary | ICD-10-CM | POA: Diagnosis not present

## 2019-10-11 DIAGNOSIS — I152 Hypertension secondary to endocrine disorders: Secondary | ICD-10-CM

## 2019-10-11 DIAGNOSIS — R1011 Right upper quadrant pain: Secondary | ICD-10-CM

## 2019-10-11 DIAGNOSIS — E1159 Type 2 diabetes mellitus with other circulatory complications: Secondary | ICD-10-CM

## 2019-10-11 DIAGNOSIS — E1165 Type 2 diabetes mellitus with hyperglycemia: Secondary | ICD-10-CM | POA: Diagnosis not present

## 2019-10-11 DIAGNOSIS — F17200 Nicotine dependence, unspecified, uncomplicated: Secondary | ICD-10-CM

## 2019-10-11 DIAGNOSIS — Z1231 Encounter for screening mammogram for malignant neoplasm of breast: Secondary | ICD-10-CM

## 2019-10-11 DIAGNOSIS — I739 Peripheral vascular disease, unspecified: Secondary | ICD-10-CM

## 2019-10-11 DIAGNOSIS — I10 Essential (primary) hypertension: Secondary | ICD-10-CM

## 2019-10-11 LAB — POCT GLYCOSYLATED HEMOGLOBIN (HGB A1C): Hemoglobin A1C: 6.1 % — AB (ref 4.0–5.6)

## 2019-10-11 NOTE — Patient Instructions (Addendum)
  Health Maintenance Due  Topic Date Due  . FOOT EXAM done today  04/13/2019  . OPHTHALMOLOGY EXAM  please call for app  05/28/2019  . MAMMOGRAM ordered today - see below 08/17/2019   Team- ordered CT scan- please give her contrast material  We need to get labs done- schedule visit for later date since your doctor was slow today!   Please let us know if worsening symptoms  We will call you within two weeks about your referral to GI and vascular surgery.  If you do not hear within 3 weeks, give [name] a call.   As always would love for you to quit smoking.   You have an appointment scheduled for: []   2D Mammogram  [x]   3D Mammogram  []   Bone Density   On 10/23/2019           At 2:00   Your appointment will at the following location  [x]   [facility]      [place], [place]         []   [place], [place]   Make sure to wear two peace clothing  No lotions powders or deodorants the day of the appointment Make sure to bring picture ID and insurance card.  Bring list of medications you are currently taking including any supplements.

## 2019-10-11 NOTE — Progress Notes (Addendum)
Phone [phone] In person visit   Subjective:   Lisa [name] is a 58 y.o. year old very pleasant female patient who presents for/with See problem oriented charting Chief Complaint  Patient presents with  . Follow-up   ROS- Review of Systems  All other systems reviewed and are negative. Except for abdominal pain  Past Medical History-  Patient Active Problem List   Diagnosis Date Noted  . Diastolic congestive heart failure ([place]) 04/17/2019    Priority: High  . Type 2 diabetes mellitus ([place]) 03/11/2017    Priority: High  . PAD (peripheral artery disease) ([place]) 01/19/2017    Priority: High  . 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 Morbid obesity ([place]) 02/14/2016    Priority: High  . Tobacco use disorder 08/12/2015    Priority: High  . COPD (chronic obstructive pulmonary disease) ([place]) 01/18/2015    Priority: High  . Hyperlipidemia associated with type 2 diabetes mellitus ([place]) 09/06/2017    Priority: Medium  . Breast mass 08/03/2017    Priority: Medium  . GERD (gastroesophageal reflux disease) 02/14/2016    Priority: Medium  . DOE (dyspnea on exertion) 03/08/2014    Priority: Medium  . Migraine without aura 07/26/2013    Priority: Medium  . Sleep apnea 02/11/2012    Priority: Medium  . Chest discomfort 10/12/2011    Priority: Medium  . Hypertension associated with diabetes ([place]) 10/12/2011    Priority: Medium  . Chronic hip pain 07/29/2017    Priority: Low  . Left knee pain 05/15/2016    Priority: Low  . Flank pain 04/21/2016    Priority: Low  . Abdominal pain 02/28/2016    Priority: Low  . Pulmonary nodule, right 04/11/2015    Priority: Low  . Mediastinal lymphadenopathy 01/18/2015    Priority: Low  . Chronic rhinitis 06/26/2019  . Trigger finger, right middle finger 05/04/2019  . Coronary artery calcification 04/20/2019  . Bilateral leg pain 04/14/2018  . Pulmonary infiltrates   . S/P  bronchoscopy with biopsy     Medications- reviewed and updated Current Outpatient Medications  Medication Sig Dispense Refill  . albuterol (PROVENTIL HFA;VENTOLIN HFA) 108 (90 Base) MCG/ACT inhaler Inhale 2 puffs into the lungs every 6 (six) hours as needed for wheezing or shortness of breath. 1 Inhaler 3  . aspirin EC 81 MG tablet Take 81 mg by mouth daily.    Marland Kitchen buPROPion (WELLBUTRIN SR) 150 MG 12 hr tablet Take 1 tablet (150 mg total) by mouth 2 (two) times daily. 180 tablet 1  . carvedilol (COREG) 25 MG tablet TAKE 1 TABLET BY MOUTH TWICE A DAY 180 tablet 0  . diclofenac sodium (VOLTAREN) 1 % GEL Apply 2-4 g topically 3 (three) times daily. 5 Tube 1  . dicyclomine (BENTYL) 10 MG capsule Take 1 capsule (10 mg total) by mouth 3 (three) times daily before meals. As needed 90 capsule 11  . ezetimibe (ZETIA) 10 MG tablet Take 1 tablet (10 mg total) by mouth daily. 90 tablet 3  . methocarbamol (ROBAXIN) 500 MG tablet Take 1 tablet (500 mg total) by mouth daily. Take one at  5 pm. 10 tablet 0  . nystatin-triamcinolone ointment (MYCOLOG) Apply 1 application topically 2 (two) times daily. 60 g 0  . ONE TOUCH LANCETS MISC Use to check blood sugars twice a day 200 each 0  . OZEMPIC, 0.25 OR 0.5 MG/DOSE, 2 MG/1.5ML SOPN INJECT 0.5 MG INTO THE SKIN ONCE  A WEEK. 3 pen 8  . pantoprazole (PROTONIX) 40 MG tablet TAKE 1 TABLET BY MOUTH EVERY DAY 90 tablet 3  . rosuvastatin (CRESTOR) 10 MG tablet Take 1 tablet (10 mg total) by mouth daily. 30 tablet 11  . spironolactone (ALDACTONE) 50 MG tablet TAKE 1 TABLET BY MOUTH EVERY DAY 90 tablet 1  . telmisartan (MICARDIS) 80 MG tablet TAKE 1 TABLET (80 MG TOTAL) BY MOUTH DAILY. UNTIL CAN GET LOSARTAN 90 tablet 0  . traMADol (ULTRAM) 50 MG tablet Take 1 tablet (50 mg total) by mouth every 6 (six) hours as needed. 15 tablet 0   No current facility-administered medications for this visit.      Objective:  BP 130/88   Pulse 77   Temp 98 F (36.7 C) (Temporal)    Ht 5\' 5"  (1.651 m)   Wt 276 lb 3.2 oz (125.3 kg)   SpO2 97%   BMI 45.96 kg/m  Gen: NAD, resting comfortably CV: RRR no murmurs rubs or gallops Lungs: CTAB no crackles, wheeze, rhonchi Abdomen: soft/patient seems to primarily be tender over the lower portion of rib cage but also has some tenderness in right upper quadrant/nondistended/normal bowel sounds. No rebound or guarding.  Ext: Trace edema Skin: warm, dry    Diabetic Foot Exam - Simple   Simple Foot Form Diabetic Foot exam was performed with the following findings: Yes 10/11/2019 12:13 PM  Visual Inspection No deformities, no ulcerations, no other skin breakdown bilaterally: Yes Sensation Testing See comments: Yes Pulse Check Posterior Tibialis and Dorsalis pulse intact bilaterally: Yes Comments Some numbness in right heel with decreased sensation         Assessment and Plan   #Right upper quadrant pain S: Patient complains of constant 5 out of 10 right upper quadrant pain at least since June.  She is seen GI who thought this could be musculoskeletal or neuropathic during September 18 visit-they suggested trial of lidocaine patches as well as Robaxin.  Patient did not note improvement in these.  Per notes suggestion was 10-day follow-up to consider abdominal imaging if not improving but patient had not reached back out yet.  Intermittent breakthrough reflux symptoms despite high-dose PPI.  No exertional element to her symptoms.  Nothing seems to make this better or worse including food or bowel movements or changes in foods  A/P: Right upper quadrant pain since June and patient with cholecystectomy history.  We opted to get CBC, CMP, lipase, amylase.   -She is already on PPI high-dose for reflux-also does have some reflux symptoms as above.  Continue PPI -No exertional element-doubt CAD as cause -Given duration of symptoms we opted to get CT scan with contrast-ordered today and patient was given contrast oral by staff  -Could still be musculoskeletal-pain seems to be most prominent over the lower portion of ribs  # Diabetes S: Compliant with ozempic.  CBGs-  Did have a high in 220s when had snickers after grapes but usually 139-150.  Exercise and diet-  weight up 8 lbs from last visit with me.  Lab Results  Component Value Date   HGBA1C 6.1 (A) 10/11/2019   HGBA1C 6.6 (H) 04/19/2019   HGBA1C 5.7 (A) 12/20/2018   A/P: reasonable control- continue current medicine as long as a1c is at least under 7  #Diastolic CHF/hypertension S: compliant with spironolactone 50mg  daily. Slight weight gain but no edema and no increased SOB A/P: CHF appears stable.  Hypertension at goal today.  Continue spironolactone  # PAD-patient  complains of cramping in her legs when walking prolonged distances.  Appears she is overdue for follow-up with vascular surgery.  I referred her back today-looks like she is due for ABIs.   #Tobacco abuse- Encouraged her to quit smoking.  She is not ready to quit at this time.  This could likely help prevent progression for PAD.  Patient feels like the stress of not being able to find work is likely contributing  Recommended follow up: We did not schedule plan follow-up-we will have team [facility] call and recommend 108-month follow-up at the latest  Future Appointments  Date Time Provider [place]  10/13/2019 11:00 AM [facility] LAB [facility] PEC  10/20/2019  3:00 PM Willia Craze, NP [facility]  10/23/2019  2:20 PM GI-BCG DIAG TOMO 1 GI-BCGMM GI-BREAST CE  10/23/2019  2:30 PM GI-BCG [name] 1 GI-BCGUS GI-BREAST CE  11/10/2019  8:00 AM [facility]   Lab/Order associations:   ICD-10-CM   1. Type 2 diabetes mellitus with hyperglycemia, without long-term current use of insulin (HCC)  E11.65 POCT glycosylated hemoglobin (Hb A1C)  2. Right upper quadrant pain  R10.11 CT Abdomen Pelvis W Contrast    GI/gastroenterology    CBC with Differential     Comprehensive metabolic panel    Lipase    Amylase  3. Diastolic congestive heart failure, unspecified HF chronicity (HCC)  I50.30   4. Hypertension associated with diabetes ([place])  E11.59    I10   5. PAD (peripheral artery disease) (HCC)  I73.9 Ambulatory referral to Vascular Surgery  6. Tobacco use disorder  F17.200   7. Encounter for screening mammogram for malignant neoplasm of breast  Z12.31 MM DIAG BREAST TOMO BILATERAL   Return precautions advised.  Garret Reddish, MD

## 2019-10-13 ENCOUNTER — Other Ambulatory Visit: Payer: Managed Care, Other (non HMO)

## 2019-10-16 NOTE — Progress Notes (Signed)
Pt called and notified to make an appointment to f/u in 4 months.

## 2019-10-20 ENCOUNTER — Ambulatory Visit (INDEPENDENT_AMBULATORY_CARE_PROVIDER_SITE_OTHER): Payer: Managed Care, Other (non HMO) | Admitting: Nurse Practitioner

## 2019-10-20 ENCOUNTER — Encounter: Payer: Self-pay | Admitting: Nurse Practitioner

## 2019-10-20 ENCOUNTER — Other Ambulatory Visit (INDEPENDENT_AMBULATORY_CARE_PROVIDER_SITE_OTHER): Payer: Managed Care, Other (non HMO)

## 2019-10-20 VITALS — BP 118/70 | HR 85 | Temp 98.3°F | Ht 65.0 in | Wt 275.0 lb

## 2019-10-20 DIAGNOSIS — R1032 Left lower quadrant pain: Secondary | ICD-10-CM | POA: Diagnosis not present

## 2019-10-20 DIAGNOSIS — R1011 Right upper quadrant pain: Secondary | ICD-10-CM

## 2019-10-20 LAB — BASIC METABOLIC PANEL
BUN: 17 mg/dL (ref 6–23)
CO2: 25 mEq/L (ref 19–32)
Calcium: 9.5 mg/dL (ref 8.4–10.5)
Chloride: 108 mEq/L (ref 96–112)
Creatinine, Ser: 1.13 mg/dL (ref 0.40–1.20)
GFR: 60.25 mL/min (ref >60.00)
Glucose, Bld: 111 mg/dL — ABNORMAL HIGH (ref 70–99)
Potassium: 3.7 mEq/L (ref 3.5–5.1)
Sodium: 139 mEq/L (ref 135–145)

## 2019-10-20 NOTE — Patient Instructions (Signed)
If you are age 56 or older, your body mass index should be between 23-30. Your Body mass index is 45.76 kg/m. If this is out of the aforementioned range listed, please consider follow up with your Primary Care Provider.  If you are age 28 or younger, your body mass index should be between 19-25. Your Body mass index is 45.76 kg/m. If this is out of the aformentioned range listed, please consider follow up with your Primary Care Provider.   You have been scheduled for a CT scan of the abdomen and pelvis at [facility].  You are scheduled on 10/27/19 at 12:30 pm. You should arrive 15 minutes prior to your appointment time for registration. Please follow the written instructions below on the day of your exam:  WARNING: IF YOU ARE ALLERGIC TO IODINE/X-RAY DYE, PLEASE NOTIFY RADIOLOGY IMMEDIATELY AT [phone]! YOU WILL BE GIVEN A 13 HOUR PREMEDICATION PREP.  1) Do not eat or drink anything after 8:30 am (4 hours prior to your test) 2) You have been given 2 bottles of oral contrast to drink. The solution may taste better if refrigerated, but do NOT add ice or any other liquid to this solution. Shake well before drinking.    Drink 1 bottle of contrast @ 10:30 am (2 hours prior to your exam)  Drink 1 bottle of contrast @ 11:30 am (1 hour prior to your exam)  You may take any medications as prescribed with a small amount of water, if necessary. If you take any of the following medications: METFORMIN, GLUCOPHAGE, GLUCOVANCE, AVANDAMET, RIOMET, FORTAMET, [place] MET, JANUMET, GLUMETZA or METAGLIP, you MAY be asked to HOLD this medication 48 hours AFTER the exam.  The purpose of you drinking the oral contrast is to aid in the visualization of your intestinal tract. The contrast solution may cause some diarrhea. Depending on your individual set of symptoms, you may also receive an intravenous injection of x-ray contrast/dye. Plan on being at [facility] for 30 minutes or longer, depending  on the type of exam you are having performed.  This test typically takes 30-45 minutes to complete.  If you have any questions regarding your exam or if you need to reschedule, you may call the CT department at [phone] between the hours of 8:00 am and 5:00 pm, Monday-Friday.  _____________________________________________________________  Your provider has requested that you go to the basement level for lab work before leaving today. Press "B" on the elevator. The lab is located at the first door on the left as you exit the elevator.  Thank you for choosing me and [facility].   Tye Savoy, NP

## 2019-10-20 NOTE — Progress Notes (Signed)
Chief Complaint:    Abdominal pain  IMPRESSION and PLAN:    1. 58 yo female with area of pinpoint tenderness in RUQ. Saw her for same pain in September and felt pain likely musculoskeletal in nature though no improvement with lidoderm patches and muscle relaxer. Just saw PCP again for same pain. She is s/p cholecystectomy -Willl proceed with CT AP.   2. LLQ pain, resolved for now. She does have a hx of sigmoid diverticulitis.  -Further evaluation at time of CT AP being done for #1.    HPI:    Lisa [name] is a 58 year old female known to Dr. Fuller Plan.  I saw her mid-September of this year for RUQ pain radiating through to her back, present since July.  Pain was burning, she was able to pinpoint it with one finger.  There was low suspicion for GI related pain. Liver tess were normal, she is s/p remote cholecystectomy.  We gave her a trial of lidocaine patches and Robaxin.  She was to call if pain didn't improve, we didn't hear back.  Patient saw PCP on 10/11/2019, still complaining of the same pain.  CT scan has been ordered but no date for it yet.  In addition to above Yoana had severe LLQ pain last Tuesday after eating steak. She initially thought the pain was diverticulitis. She had sigmoid diverticulitis in 2018. She took Motrin, ate soup, rested and pain dissipated ~ 24 hours later.  No urinary sx. She had a soft BM the night before onset of pain but mildly constipated since.     Review of systems:     No chest pain, no SOB, no fevers, no urinary sx   Past Medical History:  Diagnosis Date  . Allergic rhinitis   . Asthma   . Breast nodule    right breast, to see dr Helane Rima 06-20-2012 for   . Chest pain, non-cardiac   . Chronic headaches   . Cigarette smoker   . Complication of anesthesia 05-17-2012   trouble breathing after colonscopy, needed nebulizer  . Diastolic congestive heart failure ([place]) 04/17/2019  . Diverticulosis   . GERD (gastroesophageal reflux disease)   . Hepatic  hemangioma   . Hypertension   . IBS (irritable bowel syndrome)   . Nuclear stress test    Myoview 05/2019: EF 63, no ischemia. Low Risk  . Obesity   . Prosthetic eye globe   . Pyloric erosion   . Sleep apnea    CPAP setting varies from 4-10  . Type 2 diabetes mellitus ([place]) 03/11/2017    Patient's surgical history, family medical history, social history, medications and allergies were all reviewed in Epic     Current Outpatient Medications  Medication Sig Dispense Refill  . albuterol (PROVENTIL HFA;VENTOLIN HFA) 108 (90 Base) MCG/ACT inhaler Inhale 2 puffs into the lungs every 6 (six) hours as needed for wheezing or shortness of breath. 1 Inhaler 3  . aspirin EC 81 MG tablet Take 81 mg by mouth daily.    Marland Kitchen buPROPion (WELLBUTRIN SR) 150 MG 12 hr tablet Take 1 tablet (150 mg total) by mouth 2 (two) times daily. (Patient taking differently: Take 150 mg by mouth daily. ) 180 tablet 1  . carvedilol (COREG) 25 MG tablet TAKE 1 TABLET BY MOUTH TWICE A DAY (Patient taking differently: daily. TAKE 1 TABLET BY MOUTH TWICE A DAY) 180 tablet 0  . ezetimibe (ZETIA) 10 MG tablet Take 1 tablet (10 mg total) by mouth  daily. 90 tablet 3  . ibuprofen (ADVIL) 100 MG tablet Take 100 mg by mouth every 6 (six) hours as needed for fever.    . ONE TOUCH LANCETS MISC Use to check blood sugars twice a day 200 each 0  . OZEMPIC, 0.25 OR 0.5 MG/DOSE, 2 MG/1.5ML SOPN INJECT 0.5 MG INTO THE SKIN ONCE A WEEK. 3 pen 8  . pantoprazole (PROTONIX) 40 MG tablet TAKE 1 TABLET BY MOUTH EVERY DAY 90 tablet 3  . rosuvastatin (CRESTOR) 10 MG tablet Take 1 tablet (10 mg total) by mouth daily. 30 tablet 11  . spironolactone (ALDACTONE) 50 MG tablet TAKE 1 TABLET BY MOUTH EVERY DAY 90 tablet 1  . telmisartan (MICARDIS) 80 MG tablet TAKE 1 TABLET (80 MG TOTAL) BY MOUTH DAILY. UNTIL CAN GET LOSARTAN 90 tablet 0  . diclofenac sodium (VOLTAREN) 1 % GEL Apply 2-4 g topically 3 (three) times daily. (Patient taking differently: Apply 2-4  g topically as needed. ) 5 Tube 1  . dicyclomine (BENTYL) 10 MG capsule Take 1 capsule (10 mg total) by mouth 3 (three) times daily before meals. As needed (Patient taking differently: Take 10 mg by mouth as needed. As needed) 90 capsule 11   No current facility-administered medications for this visit.     Physical Exam:     Pulse 98   Ht 5\' 5"  (1.651 m)   Wt 275 lb (124.7 kg)   BMI 45.76 kg/m   GENERAL:  Pleasant female in NAD PSYCH: : Cooperative, normal affect EENT:  conjunctiva pink, mucous membranes moist, neck supple without masses CARDIAC:  RRR,  no peripheral edema PULM: Normal respiratory effort, lungs CTA bilaterally, no wheezing ABDOMEN:  Nondistended, soft, moderate RUQ pain (pinpoint) just below bottom rib. obvious masses,   normal bowel sounds SKIN:  turgor, no lesions seen Musculoskeletal:  Normal muscle tone, normal strength NEURO: Alert and oriented x 3, no focal neurologic deficits   Tye Savoy , NP 10/20/2019, 3:05 PM

## 2019-10-23 ENCOUNTER — Ambulatory Visit: Admission: RE | Admit: 2019-10-23 | Payer: Managed Care, Other (non HMO) | Source: Ambulatory Visit

## 2019-10-23 ENCOUNTER — Encounter: Payer: Self-pay | Admitting: Nurse Practitioner

## 2019-10-23 ENCOUNTER — Other Ambulatory Visit: Payer: Self-pay

## 2019-10-23 ENCOUNTER — Other Ambulatory Visit: Payer: Self-pay | Admitting: Family Medicine

## 2019-10-23 ENCOUNTER — Telehealth: Payer: Self-pay

## 2019-10-23 ENCOUNTER — Ambulatory Visit
Admission: RE | Admit: 2019-10-23 | Discharge: 2019-10-23 | Disposition: A | Discharge status: Home / Self Care | Payer: Managed Care, Other (non HMO) | Source: Ambulatory Visit | Attending: Family Medicine | Admitting: Family Medicine

## 2019-10-23 DIAGNOSIS — Z1231 Encounter for screening mammogram for malignant neoplasm of breast: Secondary | ICD-10-CM

## 2019-10-23 NOTE — Telephone Encounter (Signed)
Copied from [place] [id number]. Topic: Quick Communication - See Telephone Encounter >> Oct 23, 2019 10:35 AM [place] wrote: CRM for notification. See Telephone encounter for: 10/23/19.Pt calling to inform Dr Lemmie Evens that [facility] schedule the CAT scan with contrast for her/ so it does not need to be scheduled. Taken Care of!

## 2019-10-24 ENCOUNTER — Other Ambulatory Visit: Payer: Self-pay

## 2019-10-24 DIAGNOSIS — R1011 Right upper quadrant pain: Secondary | ICD-10-CM

## 2019-10-24 NOTE — Progress Notes (Signed)
Reviewed and agree with management plan.  Coby Antrobus T. Hiliary Osorto, MD FACG [facility]  

## 2019-10-25 ENCOUNTER — Other Ambulatory Visit: Payer: Self-pay

## 2019-10-27 ENCOUNTER — Ambulatory Visit (HOSPITAL_COMMUNITY): Admission: RE | Admit: 2019-10-27 | Payer: Managed Care, Other (non HMO) | Source: Ambulatory Visit

## 2019-11-02 ENCOUNTER — Ambulatory Visit (HOSPITAL_COMMUNITY)
Admission: RE | Admit: 2019-11-02 | Discharge: 2019-11-02 | Disposition: A | Discharge status: Home / Self Care | Payer: Managed Care, Other (non HMO) | Source: Ambulatory Visit | Attending: Family Medicine | Admitting: Family Medicine

## 2019-11-02 ENCOUNTER — Other Ambulatory Visit: Payer: Self-pay

## 2019-11-02 DIAGNOSIS — R1011 Right upper quadrant pain: Secondary | ICD-10-CM

## 2019-11-06 ENCOUNTER — Encounter: Payer: Self-pay | Admitting: Family Medicine

## 2019-11-06 ENCOUNTER — Ambulatory Visit (INDEPENDENT_AMBULATORY_CARE_PROVIDER_SITE_OTHER): Payer: Managed Care, Other (non HMO) | Admitting: Family Medicine

## 2019-11-06 ENCOUNTER — Telehealth: Payer: Self-pay

## 2019-11-06 VITALS — BP 132/76 | HR 87 | Ht 65.0 in | Wt 274.0 lb

## 2019-11-06 DIAGNOSIS — R1011 Right upper quadrant pain: Secondary | ICD-10-CM

## 2019-11-06 DIAGNOSIS — K760 Fatty (change of) liver, not elsewhere classified: Secondary | ICD-10-CM

## 2019-11-06 DIAGNOSIS — I152 Hypertension secondary to endocrine disorders: Secondary | ICD-10-CM

## 2019-11-06 DIAGNOSIS — E1159 Type 2 diabetes mellitus with other circulatory complications: Secondary | ICD-10-CM

## 2019-11-06 DIAGNOSIS — I10 Essential (primary) hypertension: Secondary | ICD-10-CM

## 2019-11-06 MED ORDER — BUPROPION HCL ER (XL) 150 MG PO TB24: 150.0000 mg | ORAL_TABLET | Freq: Every day | ORAL | 5 refills | Status: DC

## 2019-11-06 MED ORDER — CARVEDILOL 12.5 MG PO TABS: ORAL_TABLET | ORAL | 5 refills | Status: DC

## 2019-11-06 NOTE — Telephone Encounter (Signed)
Patient has been scheduled for a VV with Dr. Yong Channel 11/06/19 @ 1140

## 2019-11-06 NOTE — Telephone Encounter (Signed)
Please see below and schedule patient  Thank you 

## 2019-11-06 NOTE — Telephone Encounter (Signed)
Please schedule virtual or OV to discuss her concerns/next steps with Dr. Yong Channel.

## 2019-11-06 NOTE — Progress Notes (Signed)
Phone [phone] Virtual visit via Video note   Subjective:  Chief complaint: Chief Complaint  Patient presents with  . Follow-up   This visit type was conducted due to national recommendations for restrictions regarding the COVID-19 Pandemic (e.g. social distancing).  This format is felt to be most appropriate for this patient at this time balancing risks to patient and risks to population by having him in for in person visit.  No physical exam was performed (except for noted visual exam or audio findings with Telehealth visits).    Our team/I connected with [name] at 11:40 AM EST by a video enabled telemedicine application (doxy.me or caregility through epic) and verified that I am speaking with the correct person using two identifiers.  Location patient: [place] Location provider: [facility], office Persons participating in the virtual visit:  patient  Our team/I discussed the limitations of evaluation and management by telemedicine and the availability of in person appointments. In light of current covid-19 pandemic, patient also understands that we are trying to protect them by minimizing in office contact if at all possible.  The patient expressed consent for telemedicine visit and agreed to proceed. Patient understands insurance will be billed.   ROS-continues to complain of right upper quadrant pain.  No fever or chills. Complains of anxiety about medical issues  Past Medical History-  Patient Active Problem List   Diagnosis Date Noted  . Diastolic congestive heart failure ([place]) 04/17/2019    Priority: High  . Type 2 diabetes mellitus ([place]) 03/11/2017    Priority: High  . PAD (peripheral artery disease) ([place]) 01/19/2017    Priority: High  . 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 Morbid obesity ([place]) 02/14/2016    Priority: High  . Tobacco use disorder 08/12/2015    Priority: High  . COPD (chronic  obstructive pulmonary disease) ([place]) 01/18/2015    Priority: High  . Fatty liver 11/06/2019    Priority: Medium  . Hyperlipidemia associated with type 2 diabetes mellitus ([place]) 09/06/2017    Priority: Medium  . Breast mass 08/03/2017    Priority: Medium  . GERD (gastroesophageal reflux disease) 02/14/2016    Priority: Medium  . DOE (dyspnea on exertion) 03/08/2014    Priority: Medium  . Migraine without aura 07/26/2013    Priority: Medium  . Sleep apnea 02/11/2012    Priority: Medium  . Chest discomfort 10/12/2011    Priority: Medium  . Hypertension associated with diabetes ([place]) 10/12/2011    Priority: Medium  . Chronic hip pain 07/29/2017    Priority: Low  . Left knee pain 05/15/2016    Priority: Low  . Flank pain 04/21/2016    Priority: Low  . Abdominal pain 02/28/2016    Priority: Low  . Pulmonary nodule, right 04/11/2015    Priority: Low  . Mediastinal lymphadenopathy 01/18/2015    Priority: Low  . Chronic rhinitis 06/26/2019  . Trigger finger, right middle finger 05/04/2019  . Coronary artery calcification 04/20/2019  . Bilateral leg pain 04/14/2018  . Pulmonary infiltrates   . S/P bronchoscopy with biopsy     Medications- reviewed and updated Current Outpatient Medications  Medication Sig Dispense Refill  . albuterol (PROVENTIL HFA;VENTOLIN HFA) 108 (90 Base) MCG/ACT inhaler Inhale 2 puffs into the lungs every 6 (six) hours as needed for wheezing or shortness of breath. 1 Inhaler 3  . aspirin EC 81 MG tablet Take 81 mg by mouth daily.    Marland Kitchen  carvedilol (COREG) 12.5 MG tablet TAKE 1 TABLET BY MOUTH TWICE A DAY 60 tablet 5  . diclofenac sodium (VOLTAREN) 1 % GEL Apply 2-4 g topically 3 (three) times daily. (Patient taking differently: Apply 2-4 g topically as needed. ) 5 Tube 1  . dicyclomine (BENTYL) 10 MG capsule Take 1 capsule (10 mg total) by mouth 3 (three) times daily before meals. As needed (Patient taking differently: Take 10 mg by mouth as needed. As needed)  90 capsule 11  . ezetimibe (ZETIA) 10 MG tablet Take 1 tablet (10 mg total) by mouth daily. 90 tablet 3  . ibuprofen (ADVIL) 100 MG tablet Take 100 mg by mouth every 6 (six) hours as needed for fever.    . ONE TOUCH LANCETS MISC Use to check blood sugars twice a day 200 each 0  . OZEMPIC, 0.25 OR 0.5 MG/DOSE, 2 MG/1.5ML SOPN INJECT 0.5 MG INTO THE SKIN ONCE A WEEK. 3 pen 8  . pantoprazole (PROTONIX) 40 MG tablet TAKE 1 TABLET BY MOUTH EVERY DAY 90 tablet 3  . rosuvastatin (CRESTOR) 10 MG tablet Take 1 tablet (10 mg total) by mouth daily. 30 tablet 11  . spironolactone (ALDACTONE) 50 MG tablet TAKE 1 TABLET BY MOUTH EVERY DAY 90 tablet 1  . telmisartan (MICARDIS) 80 MG tablet TAKE 1 TABLET (80 MG TOTAL) BY MOUTH DAILY. UNTIL CAN GET LOSARTAN 90 tablet 0  . buPROPion (WELLBUTRIN XL) 150 MG 24 hr tablet Take 1 tablet (150 mg total) by mouth daily. Start 150 mg ER PO qam 30 tablet 5   No current facility-administered medications for this visit.      Objective:  BP 132/76   Pulse 87   Ht 5\' 5"  (1.651 m)   Wt 274 lb (124.3 kg)   BMI 45.60 kg/m  self reported vitals Gen: NAD, resting comfortably Lungs: nonlabored, normal respiratory rate  Skin: appears dry, no obvious rash     Assessment and Plan    #Right upper quadrant pain S: Patient with right upper quadrant pain reported approximately a month ago but has been going on since at least June.  Was occurring even with high-dose PPI.  No improvement with bowel movement or obvious correlation with food.  Patient has not come by for CBC, CMP, lipase, amylase yet.  Encourage patient to come by for this.  He had ordered a CT scan but insurance required ultrasound first-ultrasound really only showed fatty liver.  Her obesity likely makes ultrasound less beneficial.  No exertional component and doubted CAD.  Does still have some constipation issues.  She states at times the pain wraps around to her right side and even her back.  Likely today the pain  is bearable.  She has also seen GI who recommended CT A/P: Still with unclear cause of pain-encourage patient to get updated lab work as we need this before her CT scan-our referral coordinator is working on getting approval for imaging  #Smoking cessation S: Unfortunately patient continues to smoke.  She is on Wellbutrin 150 mg sustained release supposed to be twice a day but she only takes it in the morning and feels sluggish otherwise A/P: We did not specifically discuss her amount of smoking but I think is reasonable to continue Wellbutrin.  We did opt to use extended release version as she has had some variability in mood plus felt sluggish 150 mg twice a day sustained-release previously -would be reasonable to get updated PHQ 9 at follow-up  Fatty liver Morbid obesity  S:Patient wanted to review ultrasound results from 11/02/2019.  Patient was very worried about cancer based on the ultrasound report from her read but also was concerned about fatty liver and the potential for this to cause cancer in the long run.    From report: "IMPRESSION: 1. Status post cholecystectomy. 2. Increased hepatic echogenicity suggesting steatosis and or hepatocellular disease"  Due to her a high level of concern-she  has made large changes in her diet after viewing results my chart over the last 24 hours but with plans to continue these. She hs been doing a fatty liver diet plan that she is combining with type 2 diabetic meal plan.   Patient states she started a new diet yesterday. Cut out sodas immediately. Trying to drink more water. Last night had baked cod and a salad. For a snack later that evening had almonds. For breakfast this AM, triple zero greek yogurt, sausage links, banana. For lunch planning on almond butter sandwich, lactaid free milk.  A/P: I am thrilled patient is motivated to make changes based on fatty liver diagnosis and potential relation to cancer.  Sclera had no present concerns based  off her ultrasound cancer-though we will be getting more information based on upcoming CT -Encouraged need for healthy eating, regular exercise, weight loss to help with fatty liver as well as obesity.   -patient would like a referral to healthy weigh to wellness  Hypertension associated with diabetes ([place]) S: compliant with spironolactone 50 mg daily, telmisartan 80 mg (without hyperkalemia in past), only takes carvedilol 25 mg once daily instead of twice daily  She felt like her blood pressure was lower yesterday and so she skipped medicine yesterday.  Initially she reported taking medicines every other day and reported feeling sluggish if took meds every day but we further qualify that the only changes she has made is taking Wellbutrin just once a day and carvedilol just once a day BP Readings from Last 3 Encounters:  11/06/19 132/76  10/20/19 118/70  10/11/19 130/88  A/P: Good control today-encourage patient instead of skipping doses of medicine to first call us to see if we need to make adjustments in medicine for changes in blood pressure.  She initially reported taking all medicines every other day but on further consultation reported taking spironolactone and telmisartan regularly but was only taking carvedilol 25 mg. to Once a day-we opted to reduce the carvedilol to 12.5 mg and take it twice a day -Once again encouraged patient to call me if blood pressure is running low-could consider reducing spironolactone or telmisartan-likely reduce telmisartan 40 mg first   Recommended follow up: We did not schedule plan follow-up-6 months at the latest would be reasonable Future Appointments  Date Time Provider [place]  11/10/2019  8:00 AM [facility]    Lab/Order associations:   ICD-10-CM   1. Fatty liver  K76.0 Weight Management- [place]  2. Morbid obesity ([place])  E66.01 Weight Management- [place]  3. Hypertension associated with diabetes ([place])  E11.59     I10   4. RUQ pain  R10.11     Meds ordered this encounter  Medications  . carvedilol (COREG) 12.5 MG tablet    Sig: TAKE 1 TABLET BY MOUTH TWICE A DAY    Dispense:  60 tablet    Refill:  5  . DISCONTD: buPROPion (WELLBUTRIN XL) 150 MG 24 hr tablet- ordered in error- clarifying order below for just once daily dosing  Sig: Take 1 tablet (150 mg total) by mouth daily. Start 150 mg ER PO qam, increase after 3 days to 150 mg ER PO BID.    Dispense:  30 tablet    Refill:  5  . buPROPion (WELLBUTRIN XL) 150 MG 24 hr tablet    Sig: Take 1 tablet (150 mg total) by mouth daily. Start 150 mg ER PO qam    Dispense:  30 tablet    Refill:  5   Return precautions advised.  Garret Reddish, MD

## 2019-11-06 NOTE — Telephone Encounter (Signed)
Copied from [place] [id number]. Topic: General - Other >> Nov 06, 2019  8:37 AM Ivar Drape wrote: Reason for CRM:   Pt received her ultrasound results through [place] and she would like to speak to the provider ASAP to see what her next steps should be.

## 2019-11-06 NOTE — Assessment & Plan Note (Addendum)
Morbid obesity  S:Patient wanted to review ultrasound results from 11/02/2019.  Patient was very worried about cancer based on the ultrasound report from her read but also was concerned about fatty liver and the potential for this to cause cancer in the long run.    From report: "IMPRESSION: 1. Status post cholecystectomy. 2. Increased hepatic echogenicity suggesting steatosis and or hepatocellular disease"  Due to her a high level of concern-she  has made large changes in her diet after viewing results my chart over the last 24 hours but with plans to continue these. She hs been doing a fatty liver diet plan that she is combining with type 2 diabetic meal plan.   Patient states she started a new diet yesterday. Cut out sodas immediately. Trying to drink more water. Last night had baked cod and a salad. For a snack later that evening had almonds. For breakfast this AM, triple zero greek yogurt, sausage links, banana. For lunch planning on almond butter sandwich, lactaid free milk.  A/P: I am thrilled patient is motivated to make changes based on fatty liver diagnosis and potential relation to cancer.  Sclera had no present concerns based off her ultrasound cancer-though we will be getting more information based on upcoming CT -Encouraged need for healthy eating, regular exercise, weight loss to help with fatty liver as well as obesity.   -patient would like a referral to healthy weigh to wellness

## 2019-11-06 NOTE — Patient Instructions (Signed)
Health Maintenance Due  Topic Date Due  . OPHTHALMOLOGY EXAM  05/28/2019    Depression screen [facility] 2/9 12/20/2018 06/21/2018 04/22/2017  Decreased Interest 0 0 1  Down, Depressed, Hopeless 1 0 1  PHQ - 2 Score 1 0 2  Altered sleeping - - 0  Tired, decreased energy - - 3  Change in appetite - - 0  Feeling bad or failure about yourself  - - 1  Trouble concentrating - - 0  Moving slowly or fidgety/restless - - 0  PHQ-9 Score - - 6  Some recent data might be hidden    Recommended follow up: No follow-ups on file.

## 2019-11-06 NOTE — Assessment & Plan Note (Signed)
S: compliant with spironolactone 50 mg daily, telmisartan 80 mg (without hyperkalemia in past), only takes carvedilol 25 mg once daily instead of twice daily  She felt like her blood pressure was lower yesterday and so she skipped medicine yesterday.  Initially she reported taking medicines every other day and reported feeling sluggish if took meds every day but we further qualify that the only changes she has made is taking Wellbutrin just once a day and carvedilol just once a day BP Readings from Last 3 Encounters:  11/06/19 132/76  10/20/19 118/70  10/11/19 130/88  A/P: Good control today-encourage patient instead of skipping doses of medicine to first call us to see if we need to make adjustments in medicine for changes in blood pressure.  She initially reported taking all medicines every other day but on further consultation reported taking spironolactone and telmisartan regularly but was only taking carvedilol 25 mg. to Once a day-we opted to reduce the carvedilol to 12.5 mg and take it twice a day -Once again encouraged patient to call me if blood pressure is running low-could consider reducing spironolactone or telmisartan-likely reduce telmisartan 40 mg first

## 2019-11-07 ENCOUNTER — Ambulatory Visit: Payer: Self-pay | Admitting: Family Medicine

## 2019-11-10 ENCOUNTER — Other Ambulatory Visit: Payer: Managed Care, Other (non HMO) | Admitting: *Deleted

## 2019-11-10 ENCOUNTER — Other Ambulatory Visit: Payer: Self-pay

## 2019-11-10 DIAGNOSIS — I2510 Atherosclerotic heart disease of native coronary artery without angina pectoris: Secondary | ICD-10-CM

## 2019-11-10 DIAGNOSIS — I10 Essential (primary) hypertension: Secondary | ICD-10-CM

## 2019-11-10 DIAGNOSIS — E1159 Type 2 diabetes mellitus with other circulatory complications: Secondary | ICD-10-CM

## 2019-11-10 DIAGNOSIS — I739 Peripheral vascular disease, unspecified: Secondary | ICD-10-CM

## 2019-11-10 DIAGNOSIS — I2584 Coronary atherosclerosis due to calcified coronary lesion: Secondary | ICD-10-CM

## 2019-11-10 DIAGNOSIS — Z79899 Other long term (current) drug therapy: Secondary | ICD-10-CM

## 2019-11-10 DIAGNOSIS — E785 Hyperlipidemia, unspecified: Secondary | ICD-10-CM

## 2019-11-10 DIAGNOSIS — I152 Hypertension secondary to endocrine disorders: Secondary | ICD-10-CM

## 2019-11-10 LAB — HEPATIC FUNCTION PANEL
ALT: 22 IU/L (ref 0–32)
AST: 12 IU/L (ref 0–40)
Albumin: 4.5 g/dL (ref 3.8–4.9)
Alkaline Phosphatase: 105 IU/L (ref 39–117)
Bilirubin Total: 0.4 mg/dL (ref 0.0–1.2)
Bilirubin, Direct: 0.14 mg/dL (ref 0.00–0.40)
Total Protein: 7.8 g/dL (ref 6.0–8.5)

## 2019-11-10 LAB — LIPID PANEL
Chol/HDL Ratio: 5.7 ratio — ABNORMAL HIGH (ref 0.0–4.4)
Cholesterol, Total: 130 mg/dL (ref 100–199)
HDL: 23 mg/dL — ABNORMAL LOW (ref >39)
LDL Chol Calc (NIH): 78 mg/dL (ref 0–99)
Triglycerides: 167 mg/dL — ABNORMAL HIGH (ref 0–149)
VLDL Cholesterol Cal: 29 mg/dL (ref 5–40)

## 2019-11-14 ENCOUNTER — Ambulatory Visit (INDEPENDENT_AMBULATORY_CARE_PROVIDER_SITE_OTHER)
Admission: RE | Admit: 2019-11-14 | Discharge: 2019-11-14 | Disposition: A | Discharge status: Home / Self Care | Payer: Managed Care, Other (non HMO) | Source: Ambulatory Visit | Attending: Family Medicine | Admitting: Family Medicine

## 2019-11-14 ENCOUNTER — Other Ambulatory Visit: Payer: Self-pay

## 2019-11-14 DIAGNOSIS — R1011 Right upper quadrant pain: Secondary | ICD-10-CM

## 2019-11-14 MED ORDER — IOHEXOL 300 MG/ML  SOLN
100.0000 mL | Freq: Once | INTRAMUSCULAR | Status: AC | PRN
  Administered 2019-11-14: 14:00:00 100 mL via INTRAVENOUS

## 2019-11-15 ENCOUNTER — Encounter: Payer: Self-pay | Admitting: Family Medicine

## 2019-11-16 ENCOUNTER — Encounter: Payer: Self-pay | Admitting: Family Medicine

## 2019-11-16 LAB — COLOGUARD: Cologuard: NEGATIVE

## 2019-11-22 ENCOUNTER — Other Ambulatory Visit: Payer: Self-pay

## 2019-11-22 DIAGNOSIS — I739 Peripheral vascular disease, unspecified: Secondary | ICD-10-CM

## 2019-11-27 NOTE — Telephone Encounter (Signed)
See patient message. Hold Rosuvastatin x 2 weeks. Then resume Rosuvastatin at 10 mg once daily. Refer to Lipid Clinic. Richardson Dopp, PA-C    11/27/2019 4:55 PM

## 2019-11-28 ENCOUNTER — Encounter: Payer: Self-pay | Admitting: Family Medicine

## 2019-11-28 ENCOUNTER — Telehealth: Payer: Self-pay | Admitting: *Deleted

## 2019-11-28 ENCOUNTER — Telehealth (HOSPITAL_COMMUNITY): Payer: Self-pay | Admitting: *Deleted

## 2019-11-28 DIAGNOSIS — E785 Hyperlipidemia, unspecified: Secondary | ICD-10-CM

## 2019-11-28 DIAGNOSIS — I2510 Atherosclerotic heart disease of native coronary artery without angina pectoris: Secondary | ICD-10-CM

## 2019-11-28 NOTE — Telephone Encounter (Signed)
-----   Message from Liliane Shi, PA-C sent at 11/13/2019  9:17 AM EST ----- LFTs normal.  Triglycerides elevated.  LDL above goal (goal < 70). PLAN:   - Increase Rosuvastatin to 20 mg once daily   - Fasting Lipids and LFTs in 3 mos.  Richardson Dopp, PA-C    11/13/2019 9:08 AM

## 2019-11-28 NOTE — Telephone Encounter (Signed)

## 2019-11-29 ENCOUNTER — Encounter: Payer: Self-pay | Admitting: Family Medicine

## 2019-11-29 ENCOUNTER — Ambulatory Visit (HOSPITAL_COMMUNITY)
Admission: RE | Admit: 2019-11-29 | Discharge: 2019-11-29 | Disposition: A | Discharge status: Home / Self Care | Payer: Managed Care, Other (non HMO) | Source: Ambulatory Visit | Attending: Vascular Surgery | Admitting: Vascular Surgery

## 2019-11-29 ENCOUNTER — Ambulatory Visit (INDEPENDENT_AMBULATORY_CARE_PROVIDER_SITE_OTHER): Payer: Managed Care, Other (non HMO) | Admitting: Pharmacist

## 2019-11-29 ENCOUNTER — Ambulatory Visit (INDEPENDENT_AMBULATORY_CARE_PROVIDER_SITE_OTHER)
Admission: RE | Admit: 2019-11-29 | Discharge: 2019-11-29 | Disposition: A | Discharge status: Home / Self Care | Payer: Managed Care, Other (non HMO) | Source: Ambulatory Visit | Attending: Vascular Surgery | Admitting: Vascular Surgery

## 2019-11-29 ENCOUNTER — Other Ambulatory Visit: Payer: Self-pay

## 2019-11-29 ENCOUNTER — Encounter: Payer: Self-pay | Admitting: Vascular Surgery

## 2019-11-29 ENCOUNTER — Ambulatory Visit (INDEPENDENT_AMBULATORY_CARE_PROVIDER_SITE_OTHER): Payer: Managed Care, Other (non HMO) | Admitting: Vascular Surgery

## 2019-11-29 VITALS — BP 130/76 | HR 77 | Temp 98.0°F | Resp 18 | Ht 66.0 in | Wt 268.2 lb

## 2019-11-29 DIAGNOSIS — I739 Peripheral vascular disease, unspecified: Secondary | ICD-10-CM

## 2019-11-29 NOTE — Progress Notes (Signed)
Started in error, Lisa [name] has already started referral to lipid clinic.

## 2019-11-29 NOTE — Progress Notes (Signed)
Patient ID: Lisa [name]                 DOB: 29-Apr-1961                    MRN: [id number]     HPI: Lisa [name] is a 58 y.o. female patient of Dr Acie Fredrickson referred to lipid clinic by Richardson Dopp, PA. PMH is significant for PVD s/p left SFA angioplasty in 2018, recurrent chest pain with minimal coronary plaque by cardiac cath in 2009, COPD, HTN, HLD, obesity, and DM. Pt underwent abdominal ultrasound on 11/02/19 due to right upper quadrant chest pain which was suggestive of steatosis or hepatocellular disease. Follow up CT on 11/14/19 did not mention fatty liver. Pt did start changing her eating habits and exercising 3 times a week after her initial ultrasound.  Pt presents today in good spirits. Within 2 weeks of increasing her rosuvastatin dose from 10mg  to 20mg , pt reported difficulty walking with extreme pain in her legs and hips even when sitting. She also felt sick to her stomach, experienced headaches and nausea. She was advised to stop her rosuvastatin for 2 weeks, then resume at lower dose of 10mg  daily which she tolerated well (resumption date 12/12/19).  Pt states she stopped her rosuvastatin yesterday and already feels much better. She can walk much farther without pain. States she did notice some progressive muscle pain on the lower 10mg  dose as well. She has been making big improvements in her diet and exercise habits over the past month.  [employer] rx insurance: ID [id number] BIN [id number] PCN [id number] GRP [id number]  Current Medications: rosuvastatin 10mg  daily (currently on drug holiday for 2 weeks), ezetimibe 10mg  daily Intolerances: rosuvastatin 20mg  daily, atorvastatin 40mg  daily - myalgias and cramping Risk Factors: PVD, CAD, HTN, DM, obesity, tobacco abuse LDL goal: 55mg /dL  Diet: Cut back on Coke and chocolate, bread or pasta (if she has it - will do whole wheat), [place], chicken and fish, cut out potatoes, not frying food, eats salads daily. Snacks on walnuts, cooks with olive  oil.  Exercise: Exercising 3 days a week with walking  Family History: The patient's family history includes Breast cancer in an other family member; Diabetes in her maternal grandmother; Heart attack in her paternal uncle; Heart disease in her father; Hypertension in her mother; Lung cancer in her mother. There is no history of Colon cancer.  Social History: Smokes 1 PPD for past 38 years. No alcohol or drug use.  Labs: 11/10/19: TC 130, TG 167, HDL 23, LDL 78 (rosuvastatin 10mg  daily)  Past Medical History:  Diagnosis Date  . Allergic rhinitis   . Asthma   . Breast nodule    right breast, to see dr Helane Rima 06-20-2012 for   . Chest pain, non-cardiac   . Chronic headaches   . Cigarette smoker   . Complication of anesthesia 05-17-2012   trouble breathing after colonscopy, needed nebulizer  . Diastolic congestive heart failure ([place]) 04/17/2019  . Diverticulosis   . GERD (gastroesophageal reflux disease)   . Hepatic hemangioma   . Hypertension   . IBS (irritable bowel syndrome)   . Nuclear stress test    Myoview 05/2019: EF 63, no ischemia. Low Risk  . Obesity   . Prosthetic eye globe   . Pyloric erosion   . Sleep apnea    CPAP setting varies from 4-10  . Type 2 diabetes mellitus ([place]) 03/11/2017    Current Outpatient Medications on  File Prior to Visit  Medication Sig Dispense Refill  . albuterol (PROVENTIL HFA;VENTOLIN HFA) 108 (90 Base) MCG/ACT inhaler Inhale 2 puffs into the lungs every 6 (six) hours as needed for wheezing or shortness of breath. 1 Inhaler 3  . aspirin EC 81 MG tablet Take 81 mg by mouth daily.    Marland Kitchen buPROPion (WELLBUTRIN XL) 150 MG 24 hr tablet Take 1 tablet (150 mg total) by mouth daily. Start 150 mg ER PO qam 30 tablet 5  . carvedilol (COREG) 12.5 MG tablet TAKE 1 TABLET BY MOUTH TWICE A DAY 60 tablet 5  . diclofenac sodium (VOLTAREN) 1 % GEL Apply 2-4 g topically 3 (three) times daily. (Patient taking differently: Apply 2-4 g topically as needed. ) 5 Tube 1   . dicyclomine (BENTYL) 10 MG capsule Take 1 capsule (10 mg total) by mouth 3 (three) times daily before meals. As needed (Patient taking differently: Take 10 mg by mouth as needed. As needed) 90 capsule 11  . ezetimibe (ZETIA) 10 MG tablet Take 1 tablet (10 mg total) by mouth daily. 90 tablet 3  . ibuprofen (ADVIL) 100 MG tablet Take 100 mg by mouth every 6 (six) hours as needed for fever.    . ONE TOUCH LANCETS MISC Use to check blood sugars twice a day 200 each 0  . OZEMPIC, 0.25 OR 0.5 MG/DOSE, 2 MG/1.5ML SOPN INJECT 0.5 MG INTO THE SKIN ONCE A WEEK. 3 pen 8  . pantoprazole (PROTONIX) 40 MG tablet TAKE 1 TABLET BY MOUTH EVERY DAY 90 tablet 3  . rosuvastatin (CRESTOR) 10 MG tablet Take 1 tablet (10 mg total) by mouth daily. 30 tablet 11  . spironolactone (ALDACTONE) 50 MG tablet TAKE 1 TABLET BY MOUTH EVERY DAY 90 tablet 1  . telmisartan (MICARDIS) 80 MG tablet TAKE 1 TABLET (80 MG TOTAL) BY MOUTH DAILY. UNTIL CAN GET LOSARTAN 90 tablet 0   No current facility-administered medications on file prior to visit.    Allergies  Allergen Reactions  . Almond Oil Anaphylaxis and Nausea And Vomiting    Almond-Nuts Only  . Apple Anaphylaxis  . Ceftin [Cefuroxime] Other (See Comments)    Severe stomach pain - Diverticulitis flares up  . Cherry Extract Anaphylaxis    Cherry fruit only   . Other Anaphylaxis and Itching    Most fruits- Are NOT tolerated (PLEASE ASK PATIENT BEFORE GIVING, AS CERTAIN FRUITS ARE TOLERATED IN LIMITED QUANTITIES!!)  . Guaifenesin & Derivatives Other (See Comments)    Restlessness, jittery  . Hctz [Hydrochlorothiazide] Other (See Comments)    Causes a headache  . Naproxen Other (See Comments)    Insomnia  . Lisinopril Cough         Assessment/Plan:  1. Hyperlipidemia - LDL 78 on rosuvastatin 10mg  daily above goal < 55mg /dL. Will continue ezetimibe 10mg  daily since pt is tolerating this well. Discussed rechallenging with another statin vs PCSK9i therapy. Pt  wishes to pursue PCSK9i injections. Reviewed injection technique, expected benefits and side effect profile. She feels comfortable with injections at home since she is also on Ozempic. Will submit prior authorization for Praluent 75mg  Q2W (preferred on [employer] formulary) and follow up with pt once approved. Will plan to schedule f/u lipids and LFTs 2-3 months after starting on therapy. If LDL at goal on follow up labs, could discontinue ezetimibe at that time.  Tobi Leinweber E. Read Bonelli, PharmD, BCACP, [place] [id number], [place], [place] [address] Phone: [phone]; Fax: [phone]  [phone] 11/29/2019 3:30 PM

## 2019-11-29 NOTE — Patient Instructions (Addendum)
It was nice to meet you today  Continue holding your rosuvastatin  Continue taking your ezetimibe 10mg  daily for now  I will submit information to your insurance for either Repatha or Praluent injections. I'll give you a call when I hear back from them

## 2019-11-29 NOTE — Progress Notes (Signed)
Referring Physician:   Patient name: Lisa [name] MRN: [id number] DOB: 1961/01/05 Sex: female  HPI: Lisa [name] is a 58 y.o. female, with prior history of angioplasty of her left superficial femoral artery 2018.  She has tried to walk and increase her walking over the last several months.  However, over the last few months she has noticed that when she goes to walk both legs hurt more.  Unfortunately she continues to smoke.  She is continuing to try to quit.  She is on aspirin and a statin.  She does not have rest pain in her feet.  She does not have any nonhealing wounds.  She states both legs are equally affected.  Complains primarily of hip and posterior thigh pain.  She does not really describe classic calf claudication.  Other medical problems include hypertension sleep apnea obesity diabetes.  These are all currently controlled.  She has lost some weight and is trying to lose more.  Her arteriogram in 2018 had no significant aortoiliac occlusive disease.  She also has a history of some chronic back pain issues.  Past Medical History:  Diagnosis Date  . Allergic rhinitis   . Asthma   . Breast nodule    right breast, to see dr Helane Rima 06-20-2012 for   . Chest pain, non-cardiac   . Chronic headaches   . Cigarette smoker   . Complication of anesthesia 05-17-2012   trouble breathing after colonscopy, needed nebulizer  . Diastolic congestive heart failure ([place]) 04/17/2019  . Diverticulosis   . GERD (gastroesophageal reflux disease)   . Hepatic hemangioma   . Hypertension   . IBS (irritable bowel syndrome)   . Nuclear stress test    Myoview 05/2019: EF 63, no ischemia. Low Risk  . Obesity   . Prosthetic eye globe   . Pyloric erosion   . Sleep apnea    CPAP setting varies from 4-10  . Type 2 diabetes mellitus ([place]) 03/11/2017   Past Surgical History:  Procedure Laterality Date  . ABDOMINAL AORTOGRAM W/LOWER EXTREMITY N/A 04/30/2017   Procedure: Abdominal Aortogram w/Lower  Extremity;  Surgeon: Elam Dutch, MD;  Location: [facility];  Service: Cardiovascular;  Laterality: N/A;  . CARDIAC CATHETERIZATION  12/28/2007   EF 75%. IT REVEALS NORMAL/SUPRANORMAL LEFT VENTRICULAR SYSTOLIC FUNCTION  . CARDIOVASCULAR STRESS TEST  03/25/2007   EF 78%  . CESAREAN SECTION  2003, 2009   x 2  . CHOLECYSTECTOMY  1993  . COLONOSCOPY  2014  . ENDOMETRIAL ABLATION  09/2010   and D&C  . HERNIA REPAIR  [id number]   umbilical  . LAPAROSCOPY    . PERIPHERAL VASCULAR BALLOON ANGIOPLASTY Left 04/30/2017   Procedure: Peripheral Vascular Balloon Angioplasty;  Surgeon: Elam Dutch, MD;  Location: [facility];  Service: Cardiovascular;  Laterality: Left;  SFA  . TUBAL LIGATION    . UMBILICAL HERNIA REPAIR  2004  . US ECHOCARDIOGRAPHY  05/08/2008   EF 55-60%  . VENTRAL HERNIA REPAIR  06/15/2012   Procedure: LAPAROSCOPIC VENTRAL HERNIA;  Surgeon: Edward Jolly, MD;  Location: [facility];  Service: General;  Laterality: N/A;  LAPAROSCOPIC REPAIR VENTRAL HERNIA  . VIDEO BRONCHOSCOPY Bilateral 11/15/2017   Procedure: VIDEO BRONCHOSCOPY WITH FLUORO;  Surgeon: Collene Gobble, MD;  Location: [facility];  Service: Cardiopulmonary;  Laterality: Bilateral;    Family History  Problem Relation Age of Onset  . Hypertension Mother   . Lung cancer Mother  36  . Diabetes Maternal Grandmother   . Heart disease Father        pacemaker or defibrillator  . Heart attack Paternal Uncle   . Breast cancer Other   . Colon cancer Neg Hx     SOCIAL HISTORY: Social History   Socioeconomic History  . Marital status: Married    Spouse name: Science writer  . Number of children: 2  . Years of education: 14+  . Highest education level: Not on file  Occupational History  . Occupation: stay at home mom  Tobacco Use  . Smoking status: Current Every Day Smoker    Packs/day: 1.50    Years: 38.00    Pack years: 57.00    Types: Cigarettes  . Smokeless tobacco: Never Used  . Tobacco  comment: 10 cigarettes per day  Substance and Sexual Activity  . Alcohol use: No    Alcohol/week: 0.0 standard drinks  . Drug use: No  . Sexual activity: Yes    Comment: intercourse age 38, less than 5 sexual partners,des neg  Other Topics Concern  . Not on file  Social History Narrative   Married 1997. 2 sons 75 and 50 years old in 10/18. Wants to see children grow up.       Customer Service/accounts payable.    Went to school for medical assisting- has had a hard time finding work      Hobbies: [employer], enjoying [place], getting out of home, wants to take [employer]   Social Determinants of Radio broadcast assistant Strain:   . Difficulty of Paying Living Expenses: Not on file  Food Insecurity:   . Worried About Charity fundraiser in the Last Year: Not on file  . Ran Out of Food in the Last Year: Not on file  Transportation Needs:   . Lack of Transportation (Medical): Not on file  . Lack of Transportation (Non-Medical): Not on file  Physical Activity:   . Days of Exercise per Week: Not on file  . Minutes of Exercise per Session: Not on file  Stress:   . Feeling of Stress : Not on file  Social Connections:   . Frequency of Communication with Friends and Family: Not on file  . Frequency of Social Gatherings with Friends and Family: Not on file  . Attends Religious Services: Not on file  . Active Member of Clubs or Organizations: Not on file  . Attends Archivist Meetings: Not on file  . Marital Status: Not on file  Intimate Partner Violence:   . Fear of Current or Ex-Partner: Not on file  . Emotionally Abused: Not on file  . Physically Abused: Not on file  . Sexually Abused: Not on file    Allergies  Allergen Reactions  . Almond Oil Anaphylaxis and Nausea And Vomiting    Almond-Nuts Only  . Apple Anaphylaxis  . Ceftin [Cefuroxime] Other (See Comments)    Severe stomach pain - Diverticulitis flares up  . Cherry Extract Anaphylaxis    Cherry fruit only     . Other Anaphylaxis and Itching    Most fruits- Are NOT tolerated (PLEASE ASK PATIENT BEFORE GIVING, AS CERTAIN FRUITS ARE TOLERATED IN LIMITED QUANTITIES!!)  . Guaifenesin & Derivatives Other (See Comments)    Restlessness, jittery  . Hctz [Hydrochlorothiazide] Other (See Comments)    Causes a headache  . Naproxen Other (See Comments)    Insomnia  . Lisinopril Cough  Current Outpatient Medications  Medication Sig Dispense Refill  . albuterol (PROVENTIL HFA;VENTOLIN HFA) 108 (90 Base) MCG/ACT inhaler Inhale 2 puffs into the lungs every 6 (six) hours as needed for wheezing or shortness of breath. 1 Inhaler 3  . aspirin EC 81 MG tablet Take 81 mg by mouth daily.    Marland Kitchen buPROPion (WELLBUTRIN XL) 150 MG 24 hr tablet Take 1 tablet (150 mg total) by mouth daily. Start 150 mg ER PO qam 30 tablet 5  . carvedilol (COREG) 12.5 MG tablet TAKE 1 TABLET BY MOUTH TWICE A DAY 60 tablet 5  . diclofenac sodium (VOLTAREN) 1 % GEL Apply 2-4 g topically 3 (three) times daily. (Patient taking differently: Apply 2-4 g topically as needed. ) 5 Tube 1  . dicyclomine (BENTYL) 10 MG capsule Take 1 capsule (10 mg total) by mouth 3 (three) times daily before meals. As needed (Patient taking differently: Take 10 mg by mouth as needed. As needed) 90 capsule 11  . ezetimibe (ZETIA) 10 MG tablet Take 1 tablet (10 mg total) by mouth daily. 90 tablet 3  . ibuprofen (ADVIL) 100 MG tablet Take 100 mg by mouth every 6 (six) hours as needed for fever.    . ONE TOUCH LANCETS MISC Use to check blood sugars twice a day 200 each 0  . OZEMPIC, 0.25 OR 0.5 MG/DOSE, 2 MG/1.5ML SOPN INJECT 0.5 MG INTO THE SKIN ONCE A WEEK. 3 pen 8  . pantoprazole (PROTONIX) 40 MG tablet TAKE 1 TABLET BY MOUTH EVERY DAY 90 tablet 3  . spironolactone (ALDACTONE) 50 MG tablet TAKE 1 TABLET BY MOUTH EVERY DAY 90 tablet 1  . telmisartan (MICARDIS) 80 MG tablet TAKE 1 TABLET (80 MG TOTAL) BY MOUTH DAILY. UNTIL CAN GET LOSARTAN 90 tablet 0  .  rosuvastatin (CRESTOR) 10 MG tablet Take 1 tablet (10 mg total) by mouth daily. (Patient not taking: Reported on 11/29/2019) 30 tablet 11   No current facility-administered medications for this visit.    ROS:   General:  No weight loss, Fever, chills  HEENT: No recent headaches, no nasal bleeding, no visual changes, no sore throat  Neurologic: No dizziness, blackouts, seizures. No recent symptoms of stroke or mini- stroke. No recent episodes of slurred speech, or temporary blindness.  Cardiac: No recent episodes of chest pain/pressure, no shortness of breath at rest.  No shortness of breath with exertion.  Denies history of atrial fibrillation or irregular heartbeat  Vascular: No history of rest pain in feet.  No history of claudication.  No history of non-healing ulcer, No history of DVT   Pulmonary: No home oxygen, no productive cough, no hemoptysis,  No asthma or wheezing  Musculoskeletal:  [X]  Arthritis, [X]  Low back pain,  [X]  Joint pain  Hematologic:No history of hypercoagulable state.  No history of easy bleeding.  No history of anemia  Gastrointestinal: No hematochezia or melena,  No gastroesophageal reflux, no trouble swallowing  Urinary: [ ]  chronic Kidney disease, [ ]  on HD - [ ]  MWF or [ ]  TTHS, [ ]  Burning with urination, [ ]  Frequent urination, [ ]  Difficulty urinating;   Skin: No rashes  Psychological: No history of anxiety,  No history of depression   Physical Examination  Vitals:   11/29/19 0942  BP: 130/76  Pulse: 77  Resp: 18  Temp: 98 F (36.7 C)  TempSrc: Temporal  SpO2: 98%  Weight: 268 lb 3.2 oz (121.7 kg)  Height: 5\' 6"  (1.676 m)    Body  mass index is 43.29 kg/m.  General:  Alert and oriented, no acute distress HEENT: Normal Neck: No JVD Pulmonary: Clear to auscultation bilaterally Cardiac: Regular Rate and Rhythm  Abdomen: Soft, non-tender, non-distended, obese Skin: No rash Extremity Pulses:  2+ radial, brachial, femoral, dorsalis  pedis, posterior tibial pulses bilaterally Musculoskeletal: No deformity or edema  Neurologic: Upper and lower extremity motor 5/5 and symmetric  DATA:  Patient had bilateral ABIs performed today which were triphasic greater than 1 normal bilaterally.  On duplex ultrasound of her left leg she did have some renarrowing of her left superficial femoral artery about 50 to 75% stenosis.  Velocities were 215/109.  ASSESSMENT: Patient with bilateral leg pain.  This is primarily in the hips and thighs.  She does not really describe classic claudication.  She has normal ABIs bilaterally.  She could have an element of aortoiliac occlusive disease since both legs are affected and it is mainly with exercise.  However, she had really no significant aortoiliac stenosis in 2018.  Mild recurrent stenosis left superficial femoral artery.   PLAN: Patient will continue risk factor modification try to continue to quit smoking and lose weight.  She will try to have a walking program where she walks 30 minutes every day.  She will follow-up in 6 months time for repeat duplex of her left leg.  We will also do her ABIs with exercise to see if there is any pressure drop.  If she has significant pressure drop on her ABIs with exercise we could consider repeating her arteriogram to again assess her aortoiliac segments.  She will follow-up in our APP clinic in 6 months.  Ruta Hinds, MD [facility] Office: [phone] Pager: [phone]

## 2019-11-30 ENCOUNTER — Telehealth: Payer: Self-pay | Admitting: Pharmacist

## 2019-11-30 DIAGNOSIS — I2510 Atherosclerotic heart disease of native coronary artery without angina pectoris: Secondary | ICD-10-CM

## 2019-11-30 DIAGNOSIS — I2584 Coronary atherosclerosis due to calcified coronary lesion: Secondary | ICD-10-CM

## 2019-11-30 MED ORDER — PRALUENT 75 MG/ML ~~LOC~~ SOAJ: 1.0000 "pen " | SUBCUTANEOUS | 11 refills | Status: DC

## 2019-11-30 NOTE — Telephone Encounter (Signed)
Praluent prior authorization approved through 11/29/2020. Rx and copay card faxed to pharmacy Called patient and left VM

## 2019-12-04 NOTE — Telephone Encounter (Signed)
Spoke with pt, she is aware to pick up Praluent and will call with any concerns. Scheduled f/u labs at the end of February to assess efficacy.

## 2019-12-08 ENCOUNTER — Other Ambulatory Visit (HOSPITAL_COMMUNITY): Payer: Self-pay | Admitting: Vascular Surgery

## 2019-12-08 DIAGNOSIS — I739 Peripheral vascular disease, unspecified: Secondary | ICD-10-CM

## 2020-01-10 ENCOUNTER — Other Ambulatory Visit: Payer: Self-pay | Admitting: Cardiovascular Disease

## 2020-01-15 ENCOUNTER — Encounter: Payer: Self-pay | Admitting: Family Medicine

## 2020-01-19 ENCOUNTER — Ambulatory Visit: Payer: Managed Care, Other (non HMO) | Admitting: Emergency Medicine

## 2020-01-23 ENCOUNTER — Other Ambulatory Visit: Payer: Self-pay | Admitting: Emergency Medicine

## 2020-01-24 ENCOUNTER — Other Ambulatory Visit: Payer: Managed Care, Other (non HMO) | Admitting: *Deleted

## 2020-01-24 ENCOUNTER — Other Ambulatory Visit: Payer: Self-pay

## 2020-01-24 DIAGNOSIS — I2584 Coronary atherosclerosis due to calcified coronary lesion: Secondary | ICD-10-CM

## 2020-01-24 DIAGNOSIS — I2510 Atherosclerotic heart disease of native coronary artery without angina pectoris: Secondary | ICD-10-CM

## 2020-01-24 LAB — LIPID PANEL
Chol/HDL Ratio: 5.4 ratio — ABNORMAL HIGH (ref 0.0–4.4)
Cholesterol, Total: 141 mg/dL (ref 100–199)
HDL: 26 mg/dL — ABNORMAL LOW (ref >39)
LDL Chol Calc (NIH): 76 mg/dL (ref 0–99)
Triglycerides: 233 mg/dL — ABNORMAL HIGH (ref 0–149)
VLDL Cholesterol Cal: 39 mg/dL (ref 5–40)

## 2020-01-24 LAB — HEPATIC FUNCTION PANEL
ALT: 20 IU/L (ref 0–32)
AST: 17 IU/L (ref 0–40)
Albumin: 4 g/dL (ref 3.8–4.9)
Alkaline Phosphatase: 87 IU/L (ref 39–117)
Bilirubin Total: 0.3 mg/dL (ref 0.0–1.2)
Bilirubin, Direct: 0.12 mg/dL (ref 0.00–0.40)
Total Protein: 7.1 g/dL (ref 6.0–8.5)

## 2020-01-25 ENCOUNTER — Telehealth: Payer: Self-pay | Admitting: Pharmacist

## 2020-01-25 DIAGNOSIS — I2510 Atherosclerotic heart disease of native coronary artery without angina pectoris: Secondary | ICD-10-CM

## 2020-01-25 DIAGNOSIS — I2584 Coronary atherosclerosis due to calcified coronary lesion: Secondary | ICD-10-CM

## 2020-01-25 NOTE — Telephone Encounter (Signed)
Called pt to discuss lipid panel results. She was out of Zetia for 2-3 weeks and restarted it about a week or two ago. She has also only had 2 Praluent shots so far. Will recheck lipids in 1 month - hopefully LDL will be closer to 55 at that time. She ate a few more sweets before she had her labs checked and hasn't been walking as much which may explain the increase in triglycerides. She will focus on lifestyle improvements over the next month before lab recheck.

## 2020-02-17 ENCOUNTER — Other Ambulatory Visit: Payer: Self-pay | Admitting: Family Medicine

## 2020-02-19 ENCOUNTER — Other Ambulatory Visit: Payer: Self-pay

## 2020-02-19 ENCOUNTER — Ambulatory Visit (INDEPENDENT_AMBULATORY_CARE_PROVIDER_SITE_OTHER): Payer: Managed Care, Other (non HMO) | Admitting: Emergency Medicine

## 2020-02-19 ENCOUNTER — Encounter: Payer: Self-pay | Admitting: Emergency Medicine

## 2020-02-19 DIAGNOSIS — J84115 Respiratory bronchiolitis interstitial lung disease: Secondary | ICD-10-CM | POA: Diagnosis not present

## 2020-02-19 NOTE — Patient Instructions (Signed)
Continue to work on your exercise. We need to work hard on decreasing your smoking.  I am glad that you are motivated to do so.  We will order nicotine patches to help you cut down.  Once you have decreased significantly we can talk about trying to help you set a quit date Keep albuterol available to use 2 puffs if needed for shortness of breath, chest tightness, wheezing. We will talk about the timing of any repeat chest x-ray or CT scan of the chest x-ray next visit Work on getting the COVID-19 vaccine Follow with Dr Byrum in 6 months or sooner if you have any problems   COVID-19 Vaccine Information can be found at: [email or web address] For questions related to vaccine distribution or appointments, please email [email or web address] or call [phone].    

## 2020-02-19 NOTE — Progress Notes (Signed)
Subjective:    Patient ID: Lisa [name], female    DOB: 04/29/1961, 59 y.o.   MRN: [id number]  Shortness of Breath Pertinent negatives include no ear pain, fever, headaches, leg swelling, rash, rhinorrhea, sore throat, vomiting or wheezing.   HPI:   ROV 01/16/19 --59 year old woman, history of tobacco and RB ILD with associated COPD that we have followed with serial imaging and pulmonary function testing.  She also has obstructive sleep apnea and uses CPAP.  Most recent imaging was 11/02/2018, showed similar areas of groundglass opacity, stable pulmonary nodules (now deemed benign), possible PAH. Since last time she had a URI, developed cough, was treated by her PCP with azithro and then prednisone. Little change. Her tobacco is now back up to 20 cig a day. She remains on buproprion. She is on protonix qd. She is coughing up clear thick mucous. Some mild allergy sx. She ran out her albuterol a few months ago.  ROV 06/26/2019 --[name] follows up for her history of tobacco use that associated with RB-ILD and COPD.  She also has OSA on CPAP.  I saw her in February after she was treated by her PCP for an acute flare.  She had continued cough, mild allergy symptoms, on Protonix.  She was smoking 20 cigarettes daily at the time.  I treated her with prednisone for a flare of her cough, try to ramp up her Protonix to twice a day, added loratadine.  She reports now that she has continued to smoke about 2 pks a day (increased). She is now off protonix altogether, off loratadine. She has albuterol, has not required. Good compliance w her CPAP machine. Good clinical benefit.   ROV 02/19/20 --follow-up visit for patient with a history of tobacco abuse, COPD, RB-ILD, OSA on CPAP.  She also has chronic cough in the setting of allergic rhinitis, GERD (on Protonix).  She uses albuterol rarely. She is walking three times a week. She coughs most days. She smokes 1.5 pk/day. She is interested in trying to stop or at  least cut down. She is interested in using nicotine patches.  Great compliance with her CPAP, occasionally has some transient tightness in her chest when she wakes up   Review of Systems  Constitutional: Negative for fever and unexpected weight change.  HENT: Negative for congestion, dental problem, ear pain, nosebleeds, postnasal drip, rhinorrhea, sinus pressure, sneezing, sore throat and trouble swallowing.   Eyes: Negative for redness and itching.  Respiratory: Negative for cough, chest tightness, shortness of breath and wheezing.   Cardiovascular: Negative for palpitations and leg swelling.  Gastrointestinal: Negative for nausea and vomiting.  Genitourinary: Negative for dysuria.  Musculoskeletal: Negative for joint swelling.  Skin: Negative for rash.  Neurological: Negative for headaches.  Hematological: Does not bruise/bleed easily.  Psychiatric/Behavioral: Negative for dysphoric mood. The patient is not nervous/anxious.        Objective:   Physical Exam Vitals:   02/19/20 1546  BP: 110/64  Pulse: 79  Temp: 98.2 F (36.8 C)  TempSrc: Temporal  SpO2: 100%  Weight: 262 lb 12.8 oz (119.2 kg)  Height: 5\' 6"  (1.676 m)   Gen: Pleasant, obese, in no distress,  normal affect  ENT: No lesions,  mouth clear, L ptosis  Neck: No JVD, no stridor  Lungs: No use of accessory muscles, clear without rales or rhonchi  Cardiovascular: RRR, heart sounds normal, no murmur or gallops, trace peripheral edema  Musculoskeletal: No deformities, no cyanosis or clubbing  Neuro:  alert, non focal  Skin: Warm, no lesions or rashes       Assessment & Plan:  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 We need to work hard on decreasing your smoking.  I am glad that you are motivated to do so.  We will order nicotine patches to help you cut down.  Once you have decreased significantly we can talk about trying to help you set a  quit date Keep albuterol available to use 2 puffs if needed for shortness of breath, chest tightness, wheezing. We will talk about the timing of any repeat chest x-ray or CT scan of the chest x-ray next visit Work on getting the COVID-19 vaccine Follow with Dr Lamonte Sakai in 6 months or sooner if you have any problems   COVID-19 Vaccine Information can be found at: [email or web address] For questions related to vaccine distribution or appointments, please email [email or web address] or call [phone].     Baltazar Apo, MD, PhD 08/29/2020, 1:08 PM [facility] [phone] or if no answer [phone]

## 2020-02-22 ENCOUNTER — Ambulatory Visit: Payer: Managed Care, Other (non HMO) | Attending: Internal Medicine

## 2020-02-22 DIAGNOSIS — Z23 Encounter for immunization: Secondary | ICD-10-CM

## 2020-02-22 NOTE — Progress Notes (Signed)
   Covid-19 Vaccination Clinic  Name:  SHAHEEN SUCHANEK    MRN: [id number] DOB: 1961-04-19  02/22/2020  Ms. Sebastiani was observed post Covid-19 immunization for 15 minutes without incident. She was provided with Vaccine Information Sheet and instruction to access the V-Safe system.   Ms. Gittens was instructed to call 911 with any severe reactions post vaccine: Marland Kitchen Difficulty breathing  . Swelling of face and throat  . A fast heartbeat  . A bad rash all over body  . Dizziness and weakness   Immunizations Administered    Name Date Dose VIS Date Route   Pfizer COVID-19 Vaccine 02/22/2020  9:09 AM 0.3 mL 11/10/2019 Intramuscular   Manufacturer: [place]   Lot: [id number]   [place]: [id number]

## 2020-02-23 ENCOUNTER — Other Ambulatory Visit: Payer: Self-pay | Admitting: Family Medicine

## 2020-02-23 ENCOUNTER — Other Ambulatory Visit: Payer: Managed Care, Other (non HMO) | Admitting: *Deleted

## 2020-02-23 ENCOUNTER — Other Ambulatory Visit: Payer: Self-pay

## 2020-02-23 DIAGNOSIS — I2510 Atherosclerotic heart disease of native coronary artery without angina pectoris: Secondary | ICD-10-CM

## 2020-02-23 LAB — LIPID PANEL
Chol/HDL Ratio: 4.7 ratio — ABNORMAL HIGH (ref 0.0–4.4)
Cholesterol, Total: 132 mg/dL (ref 100–199)
HDL: 28 mg/dL — ABNORMAL LOW (ref >39)
LDL Chol Calc (NIH): 80 mg/dL (ref 0–99)
Triglycerides: 131 mg/dL (ref 0–149)
VLDL Cholesterol Cal: 24 mg/dL (ref 5–40)

## 2020-02-26 ENCOUNTER — Other Ambulatory Visit: Payer: Managed Care, Other (non HMO)

## 2020-02-26 ENCOUNTER — Telehealth: Payer: Self-pay | Admitting: Pharmacist

## 2020-02-26 DIAGNOSIS — I2584 Coronary atherosclerosis due to calcified coronary lesion: Secondary | ICD-10-CM

## 2020-02-26 DIAGNOSIS — I2510 Atherosclerotic heart disease of native coronary artery without angina pectoris: Secondary | ICD-10-CM

## 2020-02-26 MED ORDER — NEXLIZET 180-10 MG PO TABS: 1.0000 | ORAL_TABLET | Freq: Every day | ORAL | 3 refills | Status: DC

## 2020-02-26 MED ORDER — PRALUENT 150 MG/ML ~~LOC~~ SOAJ: 1.0000 "pen " | SUBCUTANEOUS | 11 refills | Status: DC

## 2020-02-26 NOTE — Addendum Note (Signed)
Addended by: SUPPLE, MEGAN E on: 02/26/2020 09:41 AM   Modules accepted: Orders

## 2020-02-26 NOTE — Telephone Encounter (Addendum)
Called pt to discuss lab results. LDL remains above goal < 55. She is adherent to Praluent 75mg  Q2W and Zetia 10mg  daily.  Will increase Praluent to 150mg  Q2W and change Zetia to Nexlizet. Copay card has been activated and sent to pharmacy. She will try to increase intake of healthy fats and increase physical activity to help raise her HDL. Will recheck lipid panel in 2-3 months.

## 2020-03-18 ENCOUNTER — Ambulatory Visit: Payer: Managed Care, Other (non HMO) | Attending: Internal Medicine

## 2020-03-18 DIAGNOSIS — Z23 Encounter for immunization: Secondary | ICD-10-CM

## 2020-03-18 NOTE — Progress Notes (Signed)
   Covid-19 Vaccination Clinic  Name:  Lisa [name]    MRN: [id number] DOB: 06-22-1961  03/18/2020  Lisa [name] was observed post Covid-19 immunization for 15 minutes without incident. She was provided with Vaccine Information Sheet and instruction to access the V-Safe system.   Lisa [name] was instructed to call 911 with any severe reactions post vaccine: Marland Kitchen Difficulty breathing  . Swelling of face and throat  . A fast heartbeat  . A bad rash all over body  . Dizziness and weakness   Immunizations Administered    Name Date Dose VIS Date Route   Pfizer COVID-19 Vaccine 03/18/2020  8:41 AM 0.3 mL 01/24/2019 Intramuscular   Manufacturer: [place]   Lot: [id number]   [place]: [id number]

## 2020-03-20 ENCOUNTER — Telehealth: Payer: Self-pay | Admitting: Family Medicine

## 2020-03-20 NOTE — Telephone Encounter (Signed)
FYI

## 2020-03-20 NOTE — Telephone Encounter (Signed)
Nurse Assessment Nurse: Laurena Bering, RN, Helene Kelp Date/Time Eilene Ghazi Time): 03/19/2020 3:34:25 PM Confirm and document reason for call. If symptomatic, describe symptoms. ---Caller states that she received Pfizer vaccine second dose yesterday. Has weakness, HA and neck pain, diarrhea and nausea Has the patient had close contact with a person known or suspected to have the novel coronavirus illness OR traveled / lives in area with major community spread (including international travel) in the last 14 days from the onset of symptoms? * If Asymptomatic, screen for exposure and travel within the last 14 days. ---No Does the patient have any new or worsening symptoms? ---Yes Will a triage be completed? ---Yes Related visit to physician within the last 2 weeks? ---Yes Does the PT have any chronic conditions? (i.e. diabetes, asthma, this includes High risk factors for pregnancy, etc.) ---Yes List chronic conditions. ---HTN, depression, elevated cholesterol Is this a behavioral health or substance abuse call? ---No Guidelines Guideline Title Affirmed Question Affirmed Notes Nurse Date/Time (Eastern Time) COVID-19 - Vaccine Questions and Reactions COVID-19 vaccine, systemic reactions (e.g., fatigue, fever, muscle aches), questions about Lisa [name] 03/19/2020 3:36:59 PMPLEASE NOTE: All timestamps contained within this report are represented as [place] Standard Time. CONFIDENTIALTY NOTICE: This fax transmission is intended only for the addressee. It contains information that is legally privileged, confidential or otherwise protected from use or disclosure. If you are not the intended recipient, you are strictly prohibited from reviewing, disclosing, copying using or disseminating any of this information or taking any action in reliance on or regarding this information. If you have received this fax in error, please notify us immediately by telephone so that we can arrange for its return to  [name]. Phone: [phone], Toll-Free: [phone], Fax: [phone] Page: 2 of 2 Call Id: [id number] [place]. Time Eilene Ghazi Time) Disposition Final User 03/19/2020 3:38:41 [place], RN, Helene Kelp Caller Disagree/Comply Comply Caller Understands Yes PreDisposition Call Doctor Care Advice Given Per Guideline HOME CARE: * You should be able to treat this at home. COVID-19 VACCINE - COMMON REACTIONS: * Local pain, redness, or swelling at injection site * Feeling tired (fatigue) * Fever and chills * Headache * Muscle aches or joint pains * Symptoms usually last 1 to 2 days. PAIN AND FEVER MEDICINES: * For pain or fever relief, take either acetaminophen or ibuprofen. CALL BACK IF: * Fever lasts over 3 days * Pain at injection site not improving after 3 days * Swollen lymph node lasts over 3 weeks * You become worse. CARE ADVICE given per COVID-19 - Vaccine Questions and Reactions (Adult) guideline

## 2020-04-08 ENCOUNTER — Telehealth: Payer: Self-pay | Admitting: Nurse Practitioner

## 2020-04-08 NOTE — Telephone Encounter (Signed)
Spoke with the patient. She has had off and on abdominal discomfort for the past 2 to 3 weeks. It has gotten worse over the past 3 to 4 days. She is not having daily bowel movements. Feels she needs to hold her abdomen because of the discomfort. Pain is in the LLQ and into the middle of her abdomen. She last moved her bowels and felt emptied on Friday. She is eating soft soft and no meats. Afebrile.  I have her an appointment for tomorrow. Please review and advise.

## 2020-04-08 NOTE — Telephone Encounter (Signed)
Patient is in acute abdominal pain is seeking to speak with a nurse to advise her.

## 2020-04-09 ENCOUNTER — Other Ambulatory Visit: Payer: Self-pay

## 2020-04-09 ENCOUNTER — Encounter: Payer: Self-pay | Admitting: Gastroenterology

## 2020-04-09 ENCOUNTER — Ambulatory Visit (INDEPENDENT_AMBULATORY_CARE_PROVIDER_SITE_OTHER): Payer: Managed Care, Other (non HMO) | Admitting: Gastroenterology

## 2020-04-09 VITALS — BP 120/62 | HR 76 | Temp 96.9°F | Ht 65.0 in | Wt 259.8 lb

## 2020-04-09 DIAGNOSIS — K5904 Chronic idiopathic constipation: Secondary | ICD-10-CM

## 2020-04-09 DIAGNOSIS — K581 Irritable bowel syndrome with constipation: Secondary | ICD-10-CM | POA: Diagnosis not present

## 2020-04-09 DIAGNOSIS — R1032 Left lower quadrant pain: Secondary | ICD-10-CM

## 2020-04-09 MED ORDER — LINACLOTIDE 145 MCG PO CAPS: 145.0000 ug | ORAL_CAPSULE | Freq: Every day | ORAL | 1 refills | Status: DC

## 2020-04-09 NOTE — Telephone Encounter (Signed)
Seen earlier in clinic today

## 2020-04-09 NOTE — Progress Notes (Signed)
    History of Present Illness: This is a 59 year old female who relates constipation, bloating, left lower quadrant pain.  She relates that her symptoms seem to of increased since second Covid-19 vaccination.  She takes magnesium citrate once every week or 2 to alleviate constipation she generally has a bowel movement about every 3 to 5 days with some relief of left lower quadrant pain.  She feels often she has incomplete evacuation.  Postprandial bloating is a persistent problem. Denies weight loss, diarrhea, change in stool caliber, melena, hematochezia, nausea, vomiting, dysphagia, reflux symptoms, chest pain.  CT AP performed in November 15, 2019:  1. Diverticulosis, most notably involving the descending colon and sigmoid colon without appreciable diverticulitis. No evident bowel obstruction. No abscess in the abdomen or pelvis. No periappendiceal region inflammation.  2. 1 mm calculus in the right kidney. No hydronephrosis or ureteral calculus on either side. Urinary bladder wall thickness within normal limits.  3. Postoperative ventral hernia repair with mesh present in the midline lower abdomen and upper pelvis.  4.  Gallbladder absent.  5. Uterus has a somewhat lobulated contour suggesting underlying leiomyomatous change.  6. Stable 4 mm nodular opacity in the right middle lobe. Mosaic attenuation in the lung bases likely is due to small airways obstructive disease.  7.  Aortic Atherosclerosis  Current Medications, Allergies, Past Medical History, Past Surgical History, Family History and Social History were reviewed in [employer] record.   Physical Exam: General: Well developed, well nourished, no acute distress Head: Normocephalic and atraumatic Eyes:  sclerae anicteric, EOMI Ears: Normal auditory acuity Mouth: Not examined, mask on during Covid-19 pandemic Lungs: Clear throughout to auscultation Heart: Regular rate and rhythm; no  murmurs, rubs or bruits Abdomen: Soft, large, mild LLQ tenderness and non distended. No masses, hepatosplenomegaly or hernias noted. Normal Bowel sounds Rectal: Not done  Musculoskeletal: Symmetrical with no gross deformities  Pulses:  Normal pulses noted Extremities: No clubbing, cyanosis, edema or deformities noted Neurological: Alert oriented x 4, grossly nonfocal Psychological:  Alert and cooperative. Normal mood and affect   Assessment and Recommendations:  1. Constipation, LLQ pain, bloating all c/w, IBS-C. Begin Linzess 145 mcg qd. Add Miralax daily if not effective.  Patient advised to call in 1-2 weeks if symptoms not adequately controlled for medication adjustments.  REV in 6 weeks.   2.  Diverticulosis.  Adequate daily water and fiber intake.

## 2020-04-09 NOTE — Patient Instructions (Signed)
Start the samples of Linzess 145 mcg one tablet by mouth every morning. A prescription has been sent to your pharmacy along with a savings card.   Call our office in a few weeks if this has not helped with your constipation symptoms.   Thank you for choosing me and [facility].  Pricilla Riffle. Dagoberto Ligas., MD., [facility]

## 2020-04-15 ENCOUNTER — Telehealth: Payer: Self-pay | Admitting: Gastroenterology

## 2020-04-15 NOTE — Telephone Encounter (Signed)
Patient reports that since Saturday she has had constant LLQ stabbing pain. She reports that the pain is exacerbated by movement.  Denies fever.  " I am afraid to eat".  She has some nausea.  She took the linzess yesterday and had a semiformed BM today.  She is asked to begin the dicyclomine on a schedule three times a day.  She has been taking it once a day and it has not helped. Dr. Fuller Plan there are no APP appts this week.  Please advise

## 2020-04-15 NOTE — Telephone Encounter (Signed)
Patient called states she started Linzess and since Sat morning she had had a sharp left pain along with loose stool and nausea please advise

## 2020-04-15 NOTE — Telephone Encounter (Signed)
Increase Linzess to 290 mcg daily (take 2 x 145 mcg). It can take a week or so to stabilize with change in dose. Use dicyclomine tid for abdominal pain.

## 2020-04-15 NOTE — Telephone Encounter (Signed)
Patient notified of the recommendations She will try changing her linzess to HS and dicyclomine TID

## 2020-04-24 ENCOUNTER — Ambulatory Visit: Payer: Managed Care, Other (non HMO) | Admitting: Nurse Practitioner

## 2020-04-26 ENCOUNTER — Other Ambulatory Visit: Payer: Self-pay

## 2020-04-26 ENCOUNTER — Other Ambulatory Visit: Payer: Managed Care, Other (non HMO) | Admitting: *Deleted

## 2020-04-26 DIAGNOSIS — I2584 Coronary atherosclerosis due to calcified coronary lesion: Secondary | ICD-10-CM

## 2020-04-26 DIAGNOSIS — I2510 Atherosclerotic heart disease of native coronary artery without angina pectoris: Secondary | ICD-10-CM

## 2020-04-26 LAB — COMPREHENSIVE METABOLIC PANEL
ALT: 22 IU/L (ref 0–32)
AST: 15 IU/L (ref 0–40)
Albumin/Globulin Ratio: 1.4 (ref 1.2–2.2)
Albumin: 4.1 g/dL (ref 3.8–4.9)
Alkaline Phosphatase: 71 IU/L (ref 48–121)
BUN/Creatinine Ratio: 22 (ref 9–23)
BUN: 27 mg/dL — ABNORMAL HIGH (ref 6–24)
Bilirubin Total: 0.2 mg/dL (ref 0.0–1.2)
CO2: 21 mmol/L (ref 20–29)
Calcium: 9.9 mg/dL (ref 8.7–10.2)
Chloride: 106 mmol/L (ref 96–106)
Creatinine, Ser: 1.25 mg/dL — ABNORMAL HIGH (ref 0.57–1.00)
GFR calc Af Amer: 56 mL/min/{1.73_m2} — ABNORMAL LOW (ref >59)
GFR calc non Af Amer: 48 mL/min/{1.73_m2} — ABNORMAL LOW (ref >59)
Globulin, Total: 2.9 g/dL (ref 1.5–4.5)
Glucose: 88 mg/dL (ref 65–99)
Potassium: 4.2 mmol/L (ref 3.5–5.2)
Sodium: 139 mmol/L (ref 134–144)
Total Protein: 7 g/dL (ref 6.0–8.5)

## 2020-04-26 LAB — LIPID PANEL
Chol/HDL Ratio: 2.6 ratio (ref 0.0–4.4)
Cholesterol, Total: 70 mg/dL — ABNORMAL LOW (ref 100–199)
HDL: 27 mg/dL — ABNORMAL LOW (ref >39)
LDL Chol Calc (NIH): 18 mg/dL (ref 0–99)
Triglycerides: 144 mg/dL (ref 0–149)
VLDL Cholesterol Cal: 25 mg/dL (ref 5–40)

## 2020-04-27 ENCOUNTER — Other Ambulatory Visit: Payer: Self-pay | Admitting: Family Medicine

## 2020-04-27 ENCOUNTER — Other Ambulatory Visit: Payer: Self-pay | Admitting: Gastroenterology

## 2020-04-30 ENCOUNTER — Telehealth: Payer: Self-pay | Admitting: Pharmacist

## 2020-04-30 ENCOUNTER — Telehealth: Payer: Self-pay | Admitting: Gastroenterology

## 2020-04-30 DIAGNOSIS — E785 Hyperlipidemia, unspecified: Secondary | ICD-10-CM

## 2020-04-30 MED ORDER — PRALUENT 75 MG/ML ~~LOC~~ SOAJ: 1.0000 "pen " | SUBCUTANEOUS | 11 refills | Status: DC

## 2020-04-30 NOTE — Telephone Encounter (Signed)
New Message  Got blood work back for [employer] and has some questions that she wants to ask as far the results. Please give her a call

## 2020-04-30 NOTE — Telephone Encounter (Signed)
Returned call to pt. LDL has decreased from 80 to 18 since increasing Praluent to 150 and changing from Zetia to Nexlizet. Labs are excellent, however can back off on Praluent back to 75mg  dose to increase LDL into the 20-30s per pt preference.  She is also concerned about her kidney function. She is staying hydrated with water and rarely uses NSAIDs. Will recheck labs again in 6-8 weeks to reassess.

## 2020-04-30 NOTE — Telephone Encounter (Signed)
Left message for patient to return my call.

## 2020-05-01 NOTE — Telephone Encounter (Signed)
Left message for patient to return my call.

## 2020-05-02 ENCOUNTER — Ambulatory Visit: Payer: Managed Care, Other (non HMO) | Admitting: Family Medicine

## 2020-05-02 NOTE — Telephone Encounter (Signed)
Left message for patient to return my call. Will await a call back.

## 2020-05-03 ENCOUNTER — Other Ambulatory Visit: Payer: Self-pay | Admitting: Family Medicine

## 2020-05-03 ENCOUNTER — Ambulatory Visit (INDEPENDENT_AMBULATORY_CARE_PROVIDER_SITE_OTHER): Payer: Managed Care, Other (non HMO) | Admitting: Family Medicine

## 2020-05-03 ENCOUNTER — Encounter: Payer: Self-pay | Admitting: Family Medicine

## 2020-05-03 ENCOUNTER — Other Ambulatory Visit: Payer: Self-pay

## 2020-05-03 VITALS — BP 136/72 | HR 78 | Temp 98.6°F | Ht 65.0 in | Wt 259.0 lb

## 2020-05-03 DIAGNOSIS — I739 Peripheral vascular disease, unspecified: Secondary | ICD-10-CM | POA: Diagnosis not present

## 2020-05-03 DIAGNOSIS — I5030 Unspecified diastolic (congestive) heart failure: Secondary | ICD-10-CM

## 2020-05-03 DIAGNOSIS — F17200 Nicotine dependence, unspecified, uncomplicated: Secondary | ICD-10-CM

## 2020-05-03 DIAGNOSIS — I152 Hypertension secondary to endocrine disorders: Secondary | ICD-10-CM

## 2020-05-03 DIAGNOSIS — E1159 Type 2 diabetes mellitus with other circulatory complications: Secondary | ICD-10-CM

## 2020-05-03 DIAGNOSIS — E1165 Type 2 diabetes mellitus with hyperglycemia: Secondary | ICD-10-CM | POA: Diagnosis not present

## 2020-05-03 DIAGNOSIS — I10 Essential (primary) hypertension: Secondary | ICD-10-CM

## 2020-05-03 LAB — POCT GLYCOSYLATED HEMOGLOBIN (HGB A1C): Hemoglobin A1C: 5.9 % — AB (ref 4.0–5.6)

## 2020-05-03 MED ORDER — NICOTINE POLACRILEX 2 MG MT GUM: 2.0000 mg | CHEWING_GUM | OROMUCOSAL | 0 refills | Status: DC | PRN

## 2020-05-03 MED ORDER — NICOTINE 21 MG/24HR TD PT24: 21.0000 mg | MEDICATED_PATCH | Freq: Every day | TRANSDERMAL | 0 refills | Status: DC

## 2020-05-03 NOTE — Progress Notes (Signed)
Phone [phone] In person visit   Subjective:   Lisa [name] is a 59 y.o. year old very pleasant female patient who presents for/with See problem oriented charting Chief Complaint  Patient presents with   Diabetes   This visit occurred during the SARS-CoV-2 public health emergency.  Safety protocols were in place, including screening questions prior to the visit, additional usage of staff PPE, and extensive cleaning of exam room while observing appropriate contact time as indicated for disinfecting solutions.   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[place]) 01/14/2017    Priority: High   Morbid obesity ([place]) 02/14/2016    Priority: High   Tobacco use disorder 08/12/2015    Priority: High   COPD (chronic obstructive pulmonary disease) ([place]) 01/18/2015    Priority: High   Fatty liver 11/06/2019    Priority: Medium   Hyperlipidemia 04/12/2018    Priority: Medium   Hyperlipidemia associated with type 2 diabetes mellitus ([place]) 09/06/2017    Priority: Medium   Breast mass 08/03/2017    Priority: Medium   GERD (gastroesophageal reflux disease) 02/14/2016    Priority: Medium   DOE (dyspnea on exertion) 03/08/2014    Priority: Medium   Migraine without aura 07/26/2013    Priority: Medium   Sleep apnea 02/11/2012    Priority: Medium   Chest discomfort 10/12/2011    Priority: Medium   Hypertension associated with diabetes ([place]) 10/12/2011    Priority: Medium   Chronic hip pain 07/29/2017    Priority: Low   Left knee pain 05/15/2016    Priority: Low   Flank pain 04/21/2016    Priority: Low   Abdominal pain 02/28/2016    Priority: Low   Pulmonary  nodule, right 04/11/2015    Priority: Low   Mediastinal lymphadenopathy 01/18/2015    Priority: Low   Chronic rhinitis 06/26/2019   Trigger finger, right middle finger 05/04/2019   Coronary artery calcification 04/20/2019   Bilateral leg pain 04/14/2018   Pulmonary infiltrates    S/P bronchoscopy with biopsy     Medications- reviewed and updated Current Outpatient Medications  Medication Sig Dispense Refill   albuterol (PROVENTIL HFA;VENTOLIN HFA) 108 (90 Base) MCG/ACT inhaler Inhale 2 puffs into the lungs every 6 (six) hours as needed for wheezing or shortness of breath. 1 Inhaler 3   Alirocumab (PRALUENT) 75 MG/ML SOAJ Inject 1 pen into the skin every 14 (fourteen) days. 2 pen 11   aspirin EC 81 MG tablet Take 81 mg by mouth daily.     Bempedoic Acid-Ezetimibe (NEXLIZET) 180-10 MG TABS Take 1 tablet by mouth daily. 90 tablet 3   buPROPion (WELLBUTRIN SR) 150 MG 12 hr tablet TAKE 1 TABLET BY MOUTH TWICE A DAY 180 tablet 0   carvedilol (COREG) 12.5 MG tablet TAKE 1 TABLET BY MOUTH TWICE A DAY 60 tablet 5   diclofenac sodium (VOLTAREN) 1 % GEL Apply 2-4 g topically 3 (three) times daily. (Patient taking differently: Apply 2-4 g topically as needed. ) 5 Tube 1   dicyclomine (BENTYL) 10 MG capsule TAKE 1 CAPSULE (10 MG TOTAL) BY MOUTH 3 (THREE) TIMES DAILY BEFORE MEALS. AS NEEDED 270 capsule 3   linaclotide (LINZESS)  145 MCG CAPS capsule Take 1 capsule (145 mcg total) by mouth daily before breakfast. 90 capsule 1   nicotine (NICODERM CQ - DOSED IN MG/24 HOURS) 21 mg/24hr patch Place 1 patch (21 mg total) onto the skin daily. 28 patch 0   nicotine polacrilex ([employer] NICOTINE) 2 MG gum Take 1 each (2 mg total) by mouth as needed for smoking cessation (max 5 a day). 100 tablet 0   ONE TOUCH LANCETS MISC Use to check blood sugars twice a day 200 each 0   OZEMPIC, 0.25 OR 0.5 MG/DOSE, 2 MG/1.5ML SOPN INJECT 0.5 MG INTO THE SKIN ONCE A WEEK. 3 pen 8   pantoprazole (PROTONIX) 40  MG tablet TAKE 1 TABLET BY MOUTH EVERY DAY 90 tablet 3   spironolactone (ALDACTONE) 50 MG tablet TAKE 1 TABLET BY MOUTH EVERY DAY 90 tablet 1   telmisartan (MICARDIS) 80 MG tablet TAKE 1 TABLET (80 MG TOTAL) BY MOUTH DAILY. UNTIL CAN GET LOSARTAN 90 tablet 0   No current facility-administered medications for this visit.     Objective:  BP 136/72    Pulse 78    Temp 98.6 F (37 C) (Temporal)    Ht 5\' 5"  (1.651 m)    Wt 259 lb (117.5 kg)    SpO2 98%    BMI 43.10 kg/m  Gen: NAD, resting comfortably CV: RRR no murmurs rubs or gallops Lungs: CTAB no crackles, wheeze, rhonchi Abdomen: Obese but improved Ext: trace edema Skin: warm, dry     Assessment and Plan   # Diabetes S: Medication: Ozempic 0.5 mg weekly since August 2019-prior GI issues on Metformin Exercise and diet-  Down 16 lbs from last visit since starting ozempic. Not exercising as was getting some upper leg pain- history of PAD- does better with consistent walking. Cut out fried foods and most red meats other than rare hamburger- trying to eat better Lab Results  Component Value Date   HGBA1C point-of-care 5.9 (A) 05/03/2020   HGBA1C point of care 6.1 (A) 10/11/2019   HGBA1C 6.6 (H) 04/19/2019   A/P: Excellent control-continue current medication.  My only concern is that some of her GI symptoms could be related to the Ozempic but she would like to continue this for now and follow-up with GI  #hypertension/diastolic CHF S: medication: Carvedilol 12.5 mg twice daily, spironolactone 50 mg, telmisartan 80 mg  Has had significant weight loss and spironolactone seems to be continuing to help with edema BP Readings from Last 3 Encounters:  05/03/20 136/72  04/09/20 120/62  02/19/20 110/64  A/P: For blood pressure excellent control-continue current medication.  Patient's BUN was high when last checked-we are can update another BUN today-if remains elevated I may reduce her spironolactone to 25 mg daily-she reports already  drinking at least 100 ounces of water a day and I wonder if diuretic is contributing to strain on the kidneys with high BUN and decrease GFR  Diastolic CHF-spironolactone very helpful but once again considering reducing dose and having her monitor home blood pressure and edema  # constipation- felt sick on linzess at lower and higher dose- called GI and states told to double it and she felt weaker, lightheaded, like all of her body's efforts were pused towards her stomach like blood drained from other areas, runny diarrhea- with this and had some loose stools. Came off of it and symptoms improved. Had bad pain this morning and tried dicyclomine  and that didn't help. Took tums and that resolved symptoms afterwards.  Had loose BM today.  -Start MiraLAX 1 capful daily mixed in water-make sure to remain very well-hydrated with this.  I also want to call Dr. Fuller Plan this afternoon and see if he has an alternate plan.  For now I am not going to recommend the Linzess unless he specifically recommends that again. - encouraged her to call GI Back for follo wup  #Current smoker-patient smoking 1.5 packs/day down from 3. Feels like menthol ban will be helpful.  We discussed multiple risks related to her long-term smoking but she simply wants to continue to work on cutting down slowly instead of a firm stop.  She does want to try nicotine patches and I sent in nicotine 21 mg patch and advised up to 5 pieces of 2 mg nicotine gum as well   PAD (peripheral artery disease) ([place]) I reviewed last note from vascular surgery.  They do not think her leg pain with walking is related to her peripheral arterial disease but she is due for updated ABIs and I encouraged her to call to schedule follow-up with vascular surgery today.  Morbid obesity ([place]) Patient is now down 17 pounds from her peak-I think Ozempic is certainly helping.  I congratulated patient on progress and encouraged her to continue.  Would encourage her to  increase exercise but I do want her to follow-up with vascular for updated ABIs as well due to leg pain with walking   Recommended follow up: Return in about 4 months (around 09/02/2020) for physical or sooner if needed. Future Appointments  Date Time Provider [place]  05/28/2020  9:50 AM Ladene Artist, MD [facility]  06/19/2020  8:30 AM [facility]  09/05/2020  2:40 PM Minami Arriaga, Brayton Mars, MD [facility]    Lab/Order associations:   ICD-10-CM   1. Type 2 diabetes mellitus with hyperglycemia, without long-term current use of insulin (HCC)  E11.65 POCT HgB C1E    Basic metabolic panel    Basic metabolic panel  2. Hypertension associated with diabetes ([place])  E11.59    I10   3. Diastolic congestive heart failure, unspecified HF chronicity (HCC)  I50.30   4. PAD (peripheral artery disease) (HCC)  I73.9   5. Morbid obesity ([place])  E66.01   6. Tobacco use disorder  F17.200     Meds ordered this encounter  Medications   nicotine (NICODERM CQ - DOSED IN MG/24 HOURS) 21 mg/24hr patch    Sig: Place 1 patch (21 mg total) onto the skin daily.    Dispense:  28 patch    Refill:  0   nicotine polacrilex ([employer] NICOTINE) 2 MG gum    Sig: Take 1 each (2 mg total) by mouth as needed for smoking cessation (max 5 a day).    Dispense:  100 tablet    Refill:  0    Return precautions advised.  Garret Reddish, MD

## 2020-05-03 NOTE — Assessment & Plan Note (Signed)
Patient is now down 17 pounds from her peak-I think Ozempic is certainly helping.  I congratulated patient on progress and encouraged her to continue.  Would encourage her to increase exercise but I do want her to follow-up with vascular for updated ABIs as well due to leg pain with walking

## 2020-05-03 NOTE — Patient Instructions (Addendum)
Health Maintenance Due  Topic Date Due  . OPHTHALMOLOGY EXAM - please get this updated and have them send [name] a copy 05/28/2019  . HEMOGLOBIN A1C doing in office today  04/09/2020   -Start MiraLAX 1 capful daily mixed in water-make sure to remain very well-hydrated with this.  I also want to call Dr. Fuller Plan this afternoon and see if he has an alternate plan.  For now I am not going to recommend the Linzess unless he specifically recommends that again.  - please call to schedule with vascular your regularly scheduled follow up with Dr. Oneida Alar  -quit smoking pretty please!   Please stop by lab before you go If you have mychart- we will send your results within 3 business days of [name] receiving them.  If you do not have mychart- we will call you about results within 5 business days of [name] receiving them.   Recommended follow up: Return in about 4 months (around 09/02/2020) for physical or sooner if needed.

## 2020-05-03 NOTE — Assessment & Plan Note (Signed)
I reviewed last note from vascular surgery.  They do not think her leg pain with walking is related to her peripheral arterial disease but she is due for updated ABIs and I encouraged her to call to schedule follow-up with vascular surgery today.

## 2020-05-06 ENCOUNTER — Other Ambulatory Visit: Payer: Self-pay

## 2020-05-06 DIAGNOSIS — R748 Abnormal levels of other serum enzymes: Secondary | ICD-10-CM

## 2020-05-06 LAB — HOUSE ACCOUNT TRACKING

## 2020-05-06 LAB — BASIC METABOLIC PANEL WITH GFR
BUN/Creatinine Ratio: 18 (calc) (ref 6–22)
BUN: 24 mg/dL (ref 7–25)
CO2: 23 mmol/L (ref 20–32)
Calcium: 10.7 mg/dL — ABNORMAL HIGH (ref 8.6–10.4)
Chloride: 106 mmol/L (ref 98–110)
Creat: 1.31 mg/dL — ABNORMAL HIGH (ref 0.50–1.05)
GFR, Est African American: 53 mL/min/{1.73_m2} — ABNORMAL LOW (ref >=60)
GFR, Est Non African American: 45 mL/min/{1.73_m2} — ABNORMAL LOW (ref >=60)
Glucose, Bld: 83 mg/dL (ref 65–99)
Potassium: 4.3 mmol/L (ref 3.5–5.3)
Sodium: 138 mmol/L (ref 135–146)

## 2020-05-06 LAB — EXTRA LAV TOP TUBE

## 2020-05-06 MED ORDER — SPIRONOLACTONE 25 MG PO TABS: 25.0000 mg | ORAL_TABLET | Freq: Every day | ORAL | 5 refills | Status: DC

## 2020-05-06 NOTE — Telephone Encounter (Signed)
Patient is returning your call.  

## 2020-05-06 NOTE — Telephone Encounter (Signed)
Informed patient of Dr. [name] recommendations. Patient verbalized understanding. Patient will try for several weeks and call if these measures do not work.

## 2020-05-06 NOTE — Telephone Encounter (Signed)
Patient states she tried the increase dose of Linzess 145 mcg (2 tablets) for several weeks. She reports that had severe nausea, loose watery stools and headaches. Patient also had sever abdominal cramping pain prior to a BM. She states she was scared to leave the house in case she had a accident. Pt took the dicyclomine three times a day every day with the Linzess with no help in symptoms. Patient states she did stop taking the Linzess after the 2 weels and now has semi-formed stools and intermittent loose stools but still feels like she is not fully evacuating her bowels. Patient reports her bloating has returned. Patient states she was told she can be put on another medication but patient states she is very reluctant to do so. Please advise Dr. Fuller Plan.

## 2020-05-06 NOTE — Telephone Encounter (Signed)
Miralax once daily (not prn). If this is not effective enough then Miralax twice daily (not prn). Miralax could take several days to have an impact so need to try above for at least a couple weeks.

## 2020-05-22 ENCOUNTER — Telehealth: Payer: Self-pay | Admitting: Gastroenterology

## 2020-05-22 NOTE — Telephone Encounter (Signed)
Pt states that LLQ pain has gotten worse. Medications have not helped pt. She would like some advise.

## 2020-05-23 NOTE — Telephone Encounter (Signed)
Spoke with patient regarding symptoms, pt reports severe left sided abdominal pain yesterday, pt states that it started with constipation and then diarrhea. Pt states that she cooked some dark leafy greens to get her bowels moving.  Pt describes pain as a sharp pain with stinging sensation, pt states that she has been taking miralax everyday, pt states that if feels like a knot, pt states that she pressed on the area but when she released the pain came back.  Pt states that she is feeling better today with, pt states that she feels better when she is moving around and walking. Pt states that when she sits down that pain comes back and then she will get up and walk around again. Pt states that she has only been taking Bentyl once a day, advised patient that she is supposed to take it up to 3 times daily before meals. Pt advised to take 3 times daily for a couple of days and call back if she doesn't get any relief, pt has an upcoming appt on 05/28/20 with Dr. Fuller Plan.

## 2020-05-28 ENCOUNTER — Ambulatory Visit (INDEPENDENT_AMBULATORY_CARE_PROVIDER_SITE_OTHER): Payer: Managed Care, Other (non HMO) | Admitting: Gastroenterology

## 2020-05-28 ENCOUNTER — Other Ambulatory Visit: Payer: Self-pay

## 2020-05-28 ENCOUNTER — Encounter: Payer: Self-pay | Admitting: Gastroenterology

## 2020-05-28 VITALS — BP 140/80 | HR 75 | Wt 258.0 lb

## 2020-05-28 DIAGNOSIS — K581 Irritable bowel syndrome with constipation: Secondary | ICD-10-CM

## 2020-05-28 DIAGNOSIS — R1032 Left lower quadrant pain: Secondary | ICD-10-CM

## 2020-05-28 MED ORDER — SUTAB 1479-225-188 MG PO TABS
1.0000 | ORAL_TABLET | ORAL | 0 refills | Status: DC
Start: 2020-05-28 — End: 2021-02-13

## 2020-05-28 NOTE — Patient Instructions (Signed)
You have been scheduled for a colonoscopy. Please follow written instructions given to you at your visit today.  Please pick up your prep supplies at the pharmacy within the next 1-3 days. If you use inhalers (even only as needed), please bring them with you on the day of your procedure.  Contact you primary care physician regarding the referral to an OBGYN.   Thank you for choosing me and [facility].  Pricilla Riffle. Dagoberto Ligas., MD., [facility]

## 2020-05-28 NOTE — Progress Notes (Signed)
    History of Present Illness: This is a 59 year old female returning for follow-up of constipation, left lower quadrant pain, left groin pain and abdominal bloating.  Linzess 145 mcg qd was started which did not relieve symptoms so it was increased to 290 mcg qd.  Following that she noted nausea, loose watery stools, headaches and abdominal cramping. She discontinued Linzess.  She was advised to use MiraLAX daily or twice daily titrated for adequate bowel movements which has been effective for constipation.  Frequent left lower quadrant pain and left groin pain persists.  It is occasionally related with bowel movements and pain is also related to certain positions and movement.  Colonoscopy in June 2013 showed moderate left colon diverticulosis, internal hemorrhoids and a hyperplastic polyp  Current Medications, Allergies, Past Medical History, Past Surgical History, Family History and Social History were reviewed in [employer] record.   Physical Exam: General: Well developed, well nourished, no acute distress Head: Normocephalic and atraumatic Eyes:  sclerae anicteric, EOMI Ears: Normal auditory acuity Mouth: Not examined, mask on during Covid-19 pandemic Lungs: Clear throughout to auscultation Heart: Regular rate and rhythm; no murmurs, rubs or bruits Abdomen: Soft, mild LLQ and L groin tenderness and non distended. No masses, hepatosplenomegaly or hernias noted. Normal Bowel sounds Rectal: Not done Musculoskeletal: Symmetrical with no gross deformities  Pulses:  Normal pulses noted Extremities: No clubbing, cyanosis, edema or deformities noted Neurological: Alert oriented x 4, grossly nonfocal Psychological:  Alert and cooperative. Normal mood and affect   Assessment and Recommendations:  1.  Constipation, left lower quadrant pain, left groin pain, moderate left colon diverticulosis.  Pain has GI and musculoskeletal features.  Continue MiraLAX once or twice  daily titrated for adequate bowel movements.  Continue dicyclomine 10 mg 3 times daily.  GYN evaluation for persistent LLQ, L groin pain.  She will obtain a GYN referral through her PCP.  Schedule colonoscopy. The risks (including bleeding, perforation, infection, missed lesions, medication reactions and possible hospitalization or surgery if complications occur), benefits, and alternatives to colonoscopy with possible biopsy and possible polypectomy were discussed with the patient and they consent to proceed.
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
## 2020-06-06 ENCOUNTER — Encounter: Payer: Self-pay | Admitting: Gastroenterology

## 2020-06-07 ENCOUNTER — Ambulatory Visit (HOSPITAL_COMMUNITY)
Admission: RE | Admit: 2020-06-07 | Discharge: 2020-06-07 | Disposition: A | Discharge status: Home / Self Care | Payer: Managed Care, Other (non HMO) | Source: Ambulatory Visit | Attending: Vascular Surgery | Admitting: Vascular Surgery

## 2020-06-07 ENCOUNTER — Ambulatory Visit (INDEPENDENT_AMBULATORY_CARE_PROVIDER_SITE_OTHER)
Admission: RE | Admit: 2020-06-07 | Discharge: 2020-06-07 | Disposition: A | Discharge status: Home / Self Care | Payer: Managed Care, Other (non HMO) | Source: Ambulatory Visit | Attending: Vascular Surgery | Admitting: Vascular Surgery

## 2020-06-07 ENCOUNTER — Ambulatory Visit (INDEPENDENT_AMBULATORY_CARE_PROVIDER_SITE_OTHER): Payer: Managed Care, Other (non HMO) | Admitting: Physician Assistant

## 2020-06-07 ENCOUNTER — Other Ambulatory Visit: Payer: Self-pay

## 2020-06-07 VITALS — BP 117/77 | HR 76 | Temp 97.7°F | Resp 20 | Ht 65.0 in | Wt 254.8 lb

## 2020-06-07 DIAGNOSIS — F17200 Nicotine dependence, unspecified, uncomplicated: Secondary | ICD-10-CM

## 2020-06-07 DIAGNOSIS — I739 Peripheral vascular disease, unspecified: Secondary | ICD-10-CM

## 2020-06-11 ENCOUNTER — Other Ambulatory Visit: Payer: Self-pay | Admitting: *Deleted

## 2020-06-11 DIAGNOSIS — I739 Peripheral vascular disease, unspecified: Secondary | ICD-10-CM

## 2020-06-12 ENCOUNTER — Encounter: Payer: Self-pay | Admitting: Gastroenterology

## 2020-06-12 ENCOUNTER — Other Ambulatory Visit: Payer: Self-pay

## 2020-06-12 ENCOUNTER — Ambulatory Visit (AMBULATORY_SURGERY_CENTER): Payer: Managed Care, Other (non HMO) | Admitting: Gastroenterology

## 2020-06-12 VITALS — BP 97/41 | HR 69 | Temp 97.1°F | Resp 14 | Ht 65.0 in | Wt 254.0 lb

## 2020-06-12 DIAGNOSIS — K5900 Constipation, unspecified: Secondary | ICD-10-CM

## 2020-06-12 DIAGNOSIS — D128 Benign neoplasm of rectum: Secondary | ICD-10-CM

## 2020-06-12 DIAGNOSIS — K649 Unspecified hemorrhoids: Secondary | ICD-10-CM

## 2020-06-12 DIAGNOSIS — K581 Irritable bowel syndrome with constipation: Secondary | ICD-10-CM

## 2020-06-12 DIAGNOSIS — R1032 Left lower quadrant pain: Secondary | ICD-10-CM

## 2020-06-12 DIAGNOSIS — K5730 Diverticulosis of large intestine without perforation or abscess without bleeding: Secondary | ICD-10-CM | POA: Diagnosis not present

## 2020-06-12 DIAGNOSIS — D123 Benign neoplasm of transverse colon: Secondary | ICD-10-CM

## 2020-06-12 MED ORDER — SODIUM CHLORIDE 0.9 % IV SOLN
500.0000 mL | INTRAVENOUS | Status: DC
Start: 2020-06-12 — End: 2020-06-12

## 2020-06-12 NOTE — Patient Instructions (Signed)
YOU HAD AN ENDOSCOPIC PROCEDURE TODAY AT THE [facility]:   Refer to the procedure report that was given to you for any specific questions about what was found during the examination.  If the procedure report does not answer your questions, please call your gastroenterologist to clarify.  If you requested that your care partner not be given the details of your procedure findings, then the procedure report has been included in a sealed envelope for you to review at your convenience later.  YOU SHOULD EXPECT: Some feelings of bloating in the abdomen. Passage of more gas than usual.  Walking can help get rid of the air that was put into your GI tract during the procedure and reduce the bloating. If you had a lower endoscopy (such as a colonoscopy or flexible sigmoidoscopy) you may notice spotting of blood in your stool or on the toilet paper. If you underwent a bowel prep for your procedure, you may not have a normal bowel movement for a few days.  Please Note:  You might notice some irritation and congestion in your nose or some drainage.  This is from the oxygen used during your procedure.  There is no need for concern and it should clear up in a day or so.  SYMPTOMS TO REPORT IMMEDIATELY:  Following lower endoscopy (colonoscopy or flexible sigmoidoscopy):  Excessive amounts of blood in the stool  Significant tenderness or worsening of abdominal pains  Swelling of the abdomen that is new, acute  Fever of 100F or higher    For urgent or emergent issues, a gastroenterologist can be reached at any hour by calling [phone]. Do not use MyChart messaging for urgent concerns.    DIET:  We do recommend a small meal at first, but then you may proceed to your regular diet.  Drink plenty of fluids but you should avoid alcoholic beverages for 24 hours.  ACTIVITY:  You should plan to take it easy for the rest of today and you should NOT DRIVE or use heavy machinery until tomorrow (because  of the sedation medicines used during the test).    FOLLOW UP: Our staff will call the number listed on your records 48-72 hours following your procedure to check on you and address any questions or concerns that you may have regarding the information given to you following your procedure. If we do not reach you, we will leave a message.  We will attempt to reach you two times.  During this call, we will ask if you have developed any symptoms of COVID 19. If you develop any symptoms (ie: fever, flu-like symptoms, shortness of breath, cough etc.) before then, please call [phone].  If you test positive for Covid 19 in the 2 weeks post procedure, please call and report this information to us.    If any biopsies were taken you will be contacted by phone or by letter within the next 1-3 weeks.  Please call us at [phone] if you have not heard about the biopsies in 3 weeks.    SIGNATURES/CONFIDENTIALITY: You and/or your care partner have signed paperwork which will be entered into your electronic medical record.  These signatures attest to the fact that that the information above on your After Visit Summary has been reviewed and is understood.  Full responsibility of the confidentiality of this discharge information lies with you and/or your care-partner.    Resume medications. Information given on polyps,diverticulosis,hemorrhoids and high fiber diet. 

## 2020-06-12 NOTE — Op Note (Signed)
[place] Patient Name: Lisa [name] Procedure Date: 06/12/2020 1:09 PM MRN: [id number] Endoscopist: Ladene Artist , MD Age: 59 Referring MD:  Date of Birth: 05/22/1961 Gender: Female Account #: [id number] Procedure:                Colonoscopy Indications:              Abdominal pain in the left lower quadrant,                            Constipation Medicines:                Monitored Anesthesia Care Procedure:                Pre-Anesthesia Assessment:                           - Prior to the procedure, a History and Physical                            was performed, and patient medications and                            allergies were reviewed. The patient's tolerance of                            previous anesthesia was also reviewed. The risks                            and benefits of the procedure and the sedation                            options and risks were discussed with the patient.                            All questions were answered, and informed consent                            was obtained. Prior Anticoagulants: The patient has                            taken no previous anticoagulant or antiplatelet                            agents. ASA Grade Assessment: III - A patient with                            severe systemic disease. After reviewing the risks                            and benefits, the patient was deemed in                            satisfactory condition to undergo the procedure.  After obtaining informed consent, the colonoscope                            was passed under direct vision. Throughout the                            procedure, the patient's blood pressure, pulse, and                            oxygen saturations were monitored continuously. The                            Colonoscope was introduced through the anus and                            advanced to the the cecum, identified by                             appendiceal orifice and ileocecal valve. The                            ileocecal valve, appendiceal orifice, and rectum                            were photographed. The quality of the bowel                            preparation was good. The colonoscopy was performed                            without difficulty. The patient tolerated the                            procedure well. Scope In: 1:23:28 PM Scope Out: 1:44:47 PM Scope Withdrawal Time: 0 hours 16 minutes 28 seconds  Total Procedure Duration: 0 hours 21 minutes 19 seconds  Findings:                 The perianal and digital rectal examinations were                            normal.                           Three sessile polyps were found in the rectum and                            transverse colon. The polyps were 6 to 8 mm in                            size. These polyps were removed with a cold snare.                            Resection and retrieval were complete.  Multiple small-mouthed diverticula were found in                            the left colon. There was narrowing of the colon in                            association with the diverticular opening. There                            was evidence of diverticular spasm.                            Peri-diverticular erythema was seen. There was no                            evidence of diverticular bleeding.                           Internal hemorrhoids were found during                            retroflexion. The hemorrhoids were small.                           The exam was otherwise without abnormality on                            direct and retroflexion views. Complications:            No immediate complications. Estimated blood loss:                            None. Estimated Blood Loss:     Estimated blood loss: none. Impression:               - Three 6 to 8 mm polyps in the rectum and in the                             transverse colon, removed with a cold snare.                            Resected and retrieved.                           - Moderate diverticulosis in the left colon. There                            was narrowing of the colon in association with the                            diverticular opening. There was evidence of                            diverticular spasm. Peri-diverticular erythema was  seen.                           - Internal hemorrhoids.                           - The examination was otherwise normal on direct                            and retroflexion views. Recommendation:           - Repeat colonoscopy after studies are complete for                            surveillance based on pathology results.                           - Patient has a contact number available for                            emergencies. The signs and symptoms of potential                            delayed complications were discussed with the                            patient. Return to normal activities tomorrow.                            Written discharge instructions were provided to the                            patient.                           - High fiber diet with plenty of fluids daily.                           - Continue present medications.                           - Await pathology results. Ladene Artist, MD 06/12/2020 1:50:40 PM This report has been signed electronically.

## 2020-06-12 NOTE — Progress Notes (Signed)
Report to PACU, RN, vss, BBS= Clear.  

## 2020-06-12 NOTE — Progress Notes (Signed)
Vs CW ° °

## 2020-06-12 NOTE — Progress Notes (Signed)
Called to room to assist during endoscopic procedure.  Patient ID and intended procedure confirmed with present staff. Received instructions for my participation in the procedure from the performing physician.  

## 2020-06-14 ENCOUNTER — Telehealth: Payer: Self-pay | Admitting: *Deleted

## 2020-06-14 NOTE — Telephone Encounter (Signed)
  Follow up Call-  Call back number 06/12/2020  Post procedure Call Back phone  # [phone]  Permission to leave phone message Yes  Some recent data might be hidden     Patient questions:  Message left to call us if necessary.

## 2020-06-14 NOTE — Telephone Encounter (Signed)
  Follow up Call-  Call back number 06/12/2020  Post procedure Call Back phone  # [phone]  Permission to leave phone message Yes  Some recent data might be hidden     Patient questions:  Message left to call us if necessary.  Second call.

## 2020-06-19 ENCOUNTER — Other Ambulatory Visit: Payer: Self-pay

## 2020-06-19 ENCOUNTER — Other Ambulatory Visit: Payer: Managed Care, Other (non HMO) | Admitting: *Deleted

## 2020-06-19 DIAGNOSIS — E785 Hyperlipidemia, unspecified: Secondary | ICD-10-CM

## 2020-06-19 LAB — COMPREHENSIVE METABOLIC PANEL
ALT: 19 IU/L (ref 0–32)
AST: 18 IU/L (ref 0–40)
Albumin/Globulin Ratio: 1.3 (ref 1.2–2.2)
Albumin: 4.3 g/dL (ref 3.8–4.9)
Alkaline Phosphatase: 72 IU/L (ref 48–121)
BUN/Creatinine Ratio: 18 (ref 9–23)
BUN: 25 mg/dL — ABNORMAL HIGH (ref 6–24)
Bilirubin Total: 0.3 mg/dL (ref 0.0–1.2)
CO2: 21 mmol/L (ref 20–29)
Calcium: 10 mg/dL (ref 8.7–10.2)
Chloride: 106 mmol/L (ref 96–106)
Creatinine, Ser: 1.39 mg/dL — ABNORMAL HIGH (ref 0.57–1.00)
GFR calc Af Amer: 49 mL/min/{1.73_m2} — ABNORMAL LOW (ref >59)
GFR calc non Af Amer: 42 mL/min/{1.73_m2} — ABNORMAL LOW (ref >59)
Globulin, Total: 3.4 g/dL (ref 1.5–4.5)
Glucose: 92 mg/dL (ref 65–99)
Potassium: 4.1 mmol/L (ref 3.5–5.2)
Sodium: 141 mmol/L (ref 134–144)
Total Protein: 7.7 g/dL (ref 6.0–8.5)

## 2020-06-19 LAB — LIPID PANEL
Chol/HDL Ratio: 4.2 ratio (ref 0.0–4.4)
Cholesterol, Total: 104 mg/dL (ref 100–199)
HDL: 25 mg/dL — ABNORMAL LOW (ref >39)
LDL Chol Calc (NIH): 57 mg/dL (ref 0–99)
Triglycerides: 121 mg/dL (ref 0–149)
VLDL Cholesterol Cal: 22 mg/dL (ref 5–40)

## 2020-06-20 ENCOUNTER — Other Ambulatory Visit: Payer: Self-pay | Admitting: Pharmacist

## 2020-06-20 MED ORDER — PRALUENT 75 MG/ML ~~LOC~~ SOAJ: 1.0000 "pen " | SUBCUTANEOUS | 3 refills | Status: DC

## 2020-06-24 ENCOUNTER — Encounter: Payer: Self-pay | Admitting: Gastroenterology

## 2020-07-07 ENCOUNTER — Other Ambulatory Visit: Payer: Self-pay | Admitting: Cardiovascular Disease

## 2020-07-29 ENCOUNTER — Other Ambulatory Visit: Payer: Self-pay | Admitting: Emergency Medicine

## 2020-07-29 ENCOUNTER — Other Ambulatory Visit: Payer: Self-pay | Admitting: Family Medicine

## 2020-08-29 NOTE — Assessment & Plan Note (Signed)
Continue to work on your exercise. We need to work hard on decreasing your smoking.  I am glad that you are motivated to do so.  We will order nicotine patches to help you cut down.  Once you have decreased significantly we can talk about trying to help you set a quit date Keep albuterol available to use 2 puffs if needed for shortness of breath, chest tightness, wheezing. We will talk about the timing of any repeat chest x-ray or CT scan of the chest x-ray next visit Work on getting the COVID-19 vaccine Follow with Dr Lamonte Sakai in 6 months or sooner if you have any problems   COVID-19 Vaccine Information can be found at: ShippingScam.co.uk For questions related to vaccine distribution or appointments, please email vaccine@[email or web address] .com or call [phone].

## 2020-09-04 NOTE — Progress Notes (Deleted)
Phone [phone]   Subjective:  Patient presents today for their annual physical. Chief complaint-noted.   See problem oriented charting- ROS- full  review of systems was completed and negative except for: ***  The following were reviewed and entered/updated in epic: Past Medical History:  Diagnosis Date  . Allergic rhinitis   . Arthritis   . Asthma   . Breast nodule    right breast, to see dr Helane Rima 06-20-2012 for   . Chest pain, non-cardiac   . Chronic headaches   . Cigarette smoker   . Complication of anesthesia 05-17-2012   trouble breathing after colonscopy, needed nebulizer  . COPD (chronic obstructive pulmonary disease) ([place])   . Diastolic congestive heart failure ([place]) 04/17/2019  . Diverticulosis   . GERD (gastroesophageal reflux disease)   . Hepatic hemangioma   . Hypertension   . IBS (irritable bowel syndrome)   . Nuclear stress test    Myoview 05/2019: EF 63, no ischemia. Low Risk  . Obesity   . Prosthetic eye globe   . Pyloric erosion   . Sleep apnea    CPAP setting varies from 4-10  . Type 2 diabetes mellitus ([place]) 03/11/2017   Patient Active Problem List   Diagnosis Date Noted  . Fatty liver 11/06/2019  . Chronic rhinitis 06/26/2019  . Trigger finger, right middle finger 05/04/2019  . Coronary artery calcification 04/20/2019  . Diastolic congestive heart failure ([place]) 04/17/2019  . Bilateral leg pain 04/14/2018  . Hyperlipidemia 04/12/2018  . Pulmonary infiltrates   . S/P bronchoscopy with biopsy   . Hyperlipidemia associated with type 2 diabetes mellitus ([place]) 09/06/2017  . Breast mass 08/03/2017  . Chronic hip pain 07/29/2017  . Type 2 diabetes mellitus ([place]) 03/11/2017  . PAD (peripheral artery disease) ([place]) 01/19/2017  . 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 Left knee pain 05/15/2016  . Flank pain 04/21/2016  . Abdominal pain 02/28/2016  . GERD (gastroesophageal reflux disease) 02/14/2016  .  Morbid obesity ([place]) 02/14/2016  . Tobacco use disorder 08/12/2015  . Pulmonary nodule, right 04/11/2015  . COPD (chronic obstructive pulmonary disease) ([place]) 01/18/2015  . Mediastinal lymphadenopathy 01/18/2015  . DOE (dyspnea on exertion) 03/08/2014  . Migraine without aura 07/26/2013  . Sleep apnea 02/11/2012  . Chest discomfort 10/12/2011  . Hypertension associated with diabetes ([place]) 10/12/2011   Past Surgical History:  Procedure Laterality Date  . ABDOMINAL AORTOGRAM W/LOWER EXTREMITY N/A 04/30/2017   Procedure: Abdominal Aortogram w/Lower Extremity;  Surgeon: Elam Dutch, MD;  Location: [facility];  Service: Cardiovascular;  Laterality: N/A;  . CARDIAC CATHETERIZATION  12/28/2007   EF 75%. IT REVEALS NORMAL/SUPRANORMAL LEFT VENTRICULAR SYSTOLIC FUNCTION  . CARDIOVASCULAR STRESS TEST  03/25/2007   EF 78%  . CESAREAN SECTION  2003, 2009   x 2  . CHOLECYSTECTOMY  1993  . COLONOSCOPY  2014  . ENDOMETRIAL ABLATION  09/2010   and D&C  . HERNIA REPAIR  3716   umbilical  . LAPAROSCOPY    . PERIPHERAL VASCULAR BALLOON ANGIOPLASTY Left 04/30/2017   Procedure: Peripheral Vascular Balloon Angioplasty;  Surgeon: Elam Dutch, MD;  Location: [facility];  Service: Cardiovascular;  Laterality: Left;  SFA  . TUBAL LIGATION    . UMBILICAL HERNIA REPAIR  2004  . US ECHOCARDIOGRAPHY  05/08/2008   EF 55-60%  . VENTRAL HERNIA REPAIR  06/15/2012   Procedure: LAPAROSCOPIC VENTRAL HERNIA;  Surgeon: Edward Jolly, MD;  Location: [facility];  Service: General;  Laterality: N/A;  LAPAROSCOPIC REPAIR VENTRAL HERNIA  . VIDEO BRONCHOSCOPY Bilateral 11/15/2017   Procedure: VIDEO BRONCHOSCOPY WITH FLUORO;  Surgeon: Collene Gobble, MD;  Location: [facility];  Service: Cardiopulmonary;  Laterality: Bilateral;    Family History  Problem Relation Age of Onset  . Hypertension Mother   . Lung cancer Mother        19  . Diabetes Maternal Grandmother   . Heart disease Father         pacemaker or defibrillator  . Heart attack Paternal Uncle   . Breast cancer Other   . Colon cancer Neg Hx   . Esophageal cancer Neg Hx   . Pancreatic cancer Neg Hx     Medications- reviewed and updated Current Outpatient Medications  Medication Sig Dispense Refill  . Alirocumab (PRALUENT) 75 MG/ML SOAJ Inject 1 pen into the skin every 14 (fourteen) days. 6 mL 3  . aspirin EC 81 MG tablet Take 81 mg by mouth daily.    . Bempedoic Acid-Ezetimibe (NEXLIZET) 180-10 MG TABS Take 1 tablet by mouth daily. 90 tablet 3  . buPROPion (WELLBUTRIN SR) 150 MG 12 hr tablet TAKE 1 TABLET BY MOUTH TWICE A DAY 180 tablet 0  . carvedilol (COREG) 12.5 MG tablet TAKE 1 TABLET BY MOUTH TWICE A DAY 60 tablet 5  . dicyclomine (BENTYL) 10 MG capsule TAKE 1 CAPSULE (10 MG TOTAL) BY MOUTH 3 (THREE) TIMES DAILY BEFORE MEALS. AS NEEDED 270 capsule 3  . nicotine (NICODERM CQ - DOSED IN MG/24 HOURS) 21 mg/24hr patch Place 1 patch (21 mg total) onto the skin daily. (Patient not taking: Reported on 06/12/2020) 28 patch 0  . nicotine polacrilex ([employer] NICOTINE) 2 MG gum Take 1 each (2 mg total) by mouth as needed for smoking cessation (max 5 a day). (Patient not taking: Reported on 06/12/2020) 100 tablet 0  . ONE TOUCH LANCETS MISC Use to check blood sugars twice a day (Patient not taking: Reported on 06/12/2020) 200 each 0  . OZEMPIC, 0.25 OR 0.5 MG/DOSE, 2 MG/1.5ML SOPN INJECT 0.5 MG INTO THE SKIN ONCE A WEEK. 3 pen 8  . pantoprazole (PROTONIX) 40 MG tablet TAKE 1 TABLET BY MOUTH EVERY DAY 90 tablet 3  . Sodium Sulfate-Mag Sulfate-KCl (SUTAB) [phone] MG TABS Take 1 kit by mouth as directed. 24 tablet 0  . spironolactone (ALDACTONE) 25 MG tablet Take 1 tablet (25 mg total) by mouth daily. 30 tablet 5  . telmisartan (MICARDIS) 80 MG tablet TAKE 1 TABLET (80 MG TOTAL) BY MOUTH DAILY. UNTIL CAN GET LOSARTAN 90 tablet 0   No current facility-administered medications for this visit.    Allergies-reviewed and  updated Allergies  Allergen Reactions  . Almond Oil Anaphylaxis and Nausea And Vomiting    Almond-Nuts Only  . Apple Anaphylaxis  . Ceftin [Cefuroxime] Other (See Comments)    Severe stomach pain - Diverticulitis flares up  . Cherry Extract Anaphylaxis    Cherry fruit only   . Other Anaphylaxis and Itching    Most fruits- Are NOT tolerated (PLEASE ASK PATIENT BEFORE GIVING, AS CERTAIN FRUITS ARE TOLERATED IN LIMITED QUANTITIES!!)  . Guaifenesin & Derivatives Other (See Comments)    Restlessness, jittery  . Hctz [Hydrochlorothiazide] Other (See Comments)    Causes a headache  . Naproxen Other (See Comments)    Insomnia  . Lisinopril Cough         Social History   Social History Narrative   Married 1997. 2  sons 42 and 61 years old in 10/18. Wants to see children grow up.       Customer Service/accounts payable.    Went to school for medical assisting- has had a hard time finding work      Hobbies: [employer], enjoying [employer], getting out of home, wants to take [employer] lesson   Objective  Objective:  There were no vitals taken for this visit. Gen: NAD, resting comfortably HEENT: Mucous membranes are moist. Oropharynx normal Neck: no thyromegaly CV: RRR no murmurs rubs or gallops Lungs: CTAB no crackles, wheeze, rhonchi Abdomen: soft/nontender/nondistended/normal bowel sounds. No rebound or guarding.  Ext: no edema Skin: warm, dry Neuro: grossly normal, moves all extremities, PERRLA***   Assessment and Plan   59 y.o. female presenting for annual physical.  Health Maintenance counseling: 1. Anticipatory guidance: Patient counseled regarding regular dental exams ***q6 months, eye exams ***,  avoiding smoking and second hand smoke*** , limiting alcohol to 1 beverage per day*** .   2. Risk factor reduction:  Advised patient of need for regular exercise and diet rich and fruits and vegetables to reduce risk of heart attack and stroke. Exercise- ***. Diet-***.  Wt Readings from Last  3 Encounters:  06/12/20 254 lb (115.2 kg)  06/07/20 254 lb 12.8 oz (115.6 kg)  05/28/20 258 lb (117 kg)   3. Immunizations/screenings/ancillary studies Immunization History  Administered Date(s) Administered  . Influenza Split 12/31/2013, 08/30/2014  . Influenza,inj,Quad PF,6+ Mos 08/12/2015, 08/21/2016, 09/06/2017, 09/14/2018, 09/01/2019  . PFIZER SARS-COV-2 Vaccination 02/22/2020, 03/18/2020  . PPD Test 03/04/2012  . Pneumococcal Polysaccharide-23 06/16/2012  . Tdap 03/04/2012   Health Maintenance Due  Topic Date Due  . OPHTHALMOLOGY EXAM  05/28/2019  . INFLUENZA VACCINE  06/30/2020   4. Cervical cancer screening- *** 5. Breast cancer screening-  breast exam *** and mammogram *** 6. Colon cancer screening - *** 7. Skin cancer screening- ***advised regular sunscreen use. Denies worrisome, changing, or new skin lesions.  8. Birth control/STD check- *** 9. Osteoporosis screening at 13- *** -*** smoker  Status of chronic or acute concerns  #hyperlipidemia S: Medication:***  Lab Results  Component Value Date   CHOL 104 06/19/2020   HDL 25 (L) 06/19/2020   LDLCALC 57 06/19/2020   TRIG 121 06/19/2020   CHOLHDL 4.2 06/19/2020   A/P: ***  # Diabetes S: Medication: Ozempic1.5ML CBGs- *** Exercise and diet- *** Lab Results  Component Value Date   HGBA1C 5.9 (A) 05/03/2020   HGBA1C 6.1 (A) 10/11/2019   HGBA1C 6.6 (H) 04/19/2019    A/P: ***  #hyperlipidemia S: Medication:  Lab Results  Component Value Date   CHOL 104 06/19/2020   HDL 25 (L) 06/19/2020   LDLCALC 57 06/19/2020   TRIG 121 06/19/2020   CHOLHDL 4.2 06/19/2020   A/P: ***  # GERD S:Medication: Pantoprozole 40Mg  B12 levels related to PPI use: Lab Results  Component Value Date   VITAMINB12 729 04/22/2017   A/P: ***   #hypertension S: medication: Telmisartan 80Mg , spironolactone 25Mg , carvedilol 12.5Mg  Home readings #s: *** BP Readings from Last 3 Encounters:  06/12/20 (!) 97/41  06/07/20 117/77   05/28/20 140/80  A/P: ***   No specialty comments available. *** No diagnosis found.  Recommended follow up: ***No follow-ups on file. Future Appointments  Date Time Provider [place]  09/05/2020  2:40 PM Marin Olp, MD [facility]    No chief complaint on file.  Lab/Order associations:*** fasting No diagnosis found.  No orders of the  defined types were placed in this encounter.   Return precautions advised.  Clyde Lundborg, CMA

## 2020-09-05 ENCOUNTER — Encounter: Payer: Managed Care, Other (non HMO) | Admitting: Family Medicine

## 2020-09-05 ENCOUNTER — Telehealth (INDEPENDENT_AMBULATORY_CARE_PROVIDER_SITE_OTHER): Payer: Managed Care, Other (non HMO) | Admitting: Family Medicine

## 2020-09-05 ENCOUNTER — Encounter: Payer: Self-pay | Admitting: Family Medicine

## 2020-09-05 ENCOUNTER — Other Ambulatory Visit: Payer: Managed Care, Other (non HMO)

## 2020-09-05 VITALS — Temp 97.9°F

## 2020-09-05 DIAGNOSIS — R059 Cough, unspecified: Secondary | ICD-10-CM

## 2020-09-05 DIAGNOSIS — E1159 Type 2 diabetes mellitus with other circulatory complications: Secondary | ICD-10-CM | POA: Diagnosis not present

## 2020-09-05 DIAGNOSIS — R0981 Nasal congestion: Secondary | ICD-10-CM | POA: Diagnosis not present

## 2020-09-05 DIAGNOSIS — I152 Hypertension secondary to endocrine disorders: Secondary | ICD-10-CM

## 2020-09-05 DIAGNOSIS — Z20822 Contact with and (suspected) exposure to covid-19: Secondary | ICD-10-CM

## 2020-09-05 NOTE — Patient Instructions (Signed)
Health Maintenance Due  Topic Date Due  . OPHTHALMOLOGY EXAM  05/28/2019  . INFLUENZA VACCINE  06/30/2020    Depression screen [facility] 2/9 05/03/2020 12/20/2018 06/21/2018  Decreased Interest 0 0 0  Down, Depressed, Hopeless 0 1 0  PHQ - 2 Score 0 1 0  Altered sleeping 0 - -  Tired, decreased energy 0 - -  Change in appetite 0 - -  Feeling bad or failure about yourself  0 - -  Trouble concentrating 0 - -  Moving slowly or fidgety/restless 0 - -  Suicidal thoughts 0 - -  PHQ-9 Score 0 - -  Difficult doing work/chores Not difficult at all - -  Some recent data might be hidden    Recommended follow up: No follow-ups on file.

## 2020-09-05 NOTE — Progress Notes (Signed)
Phone [phone] Virtual visit via Video note   Subjective:  Chief complaint: Chief Complaint  Patient presents with  . Cough, Congestion, Runny nose   This visit type was conducted due to national recommendations for restrictions regarding the COVID-19 Pandemic (e.g. social distancing).  This format is felt to be most appropriate for this patient at this time balancing risks to patient and risks to population by having him in for in person visit.  No physical exam was performed (except for noted visual exam or audio findings with Telehealth visits).    Our team/I connected with [name] at  2:40 PM EDT by a video enabled telemedicine application (doxy.me or caregility through epic) and verified that I am speaking with the correct person using two identifiers.  Location patient: [place] Location provider: [facility], office Persons participating in the virtual visit:  patient  Our team/I discussed the limitations of evaluation and management by telemedicine and the availability of in person appointments. In light of current covid-19 pandemic, patient also understands that we are trying to protect them by minimizing in office contact if at all possible.  The patient expressed consent for telemedicine visit and agreed to proceed. Patient understands insurance will be billed.   Past Medical History-  Patient Active Problem List   Diagnosis Date Noted  . Diastolic congestive heart failure ([place]) 04/17/2019    Priority: High  . Type 2 diabetes mellitus ([place]) 03/11/2017    Priority: High  . PAD (peripheral artery disease) ([place]) 01/19/2017    Priority: High  . 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 Morbid obesity ([place]) 02/14/2016    Priority: High  . Tobacco use disorder 08/12/2015    Priority: High  . COPD (chronic obstructive pulmonary disease) ([place]) 01/18/2015    Priority: High  . Fatty liver 11/06/2019     Priority: Medium  . Hyperlipidemia 04/12/2018    Priority: Medium  . Hyperlipidemia associated with type 2 diabetes mellitus ([place]) 09/06/2017    Priority: Medium  . Breast mass 08/03/2017    Priority: Medium  . GERD (gastroesophageal reflux disease) 02/14/2016    Priority: Medium  . DOE (dyspnea on exertion) 03/08/2014    Priority: Medium  . Migraine without aura 07/26/2013    Priority: Medium  . Sleep apnea 02/11/2012    Priority: Medium  . Chest discomfort 10/12/2011    Priority: Medium  . Hypertension associated with diabetes ([place]) 10/12/2011    Priority: Medium  . Chronic hip pain 07/29/2017    Priority: Low  . Left knee pain 05/15/2016    Priority: Low  . Flank pain 04/21/2016    Priority: Low  . Abdominal pain 02/28/2016    Priority: Low  . Pulmonary nodule, right 04/11/2015    Priority: Low  . Mediastinal lymphadenopathy 01/18/2015    Priority: Low  . Chronic rhinitis 06/26/2019  . Trigger finger, right middle finger 05/04/2019  . Coronary artery calcification 04/20/2019  . Bilateral leg pain 04/14/2018  . Pulmonary infiltrates   . S/P bronchoscopy with biopsy     Medications- reviewed and updated Current Outpatient Medications  Medication Sig Dispense Refill  . Alirocumab (PRALUENT) 75 MG/ML SOAJ Inject 1 pen into the skin every 14 (fourteen) days. 6 mL 3  . aspirin EC 81 MG tablet Take 81 mg by mouth daily.    . Bempedoic Acid-Ezetimibe (NEXLIZET) 180-10 MG TABS Take 1 tablet by mouth daily. 90 tablet 3  . buPROPion (  WELLBUTRIN SR) 150 MG 12 hr tablet TAKE 1 TABLET BY MOUTH TWICE A DAY 180 tablet 0  . carvedilol (COREG) 12.5 MG tablet TAKE 1 TABLET BY MOUTH TWICE A DAY 60 tablet 5  . dicyclomine (BENTYL) 10 MG capsule TAKE 1 CAPSULE (10 MG TOTAL) BY MOUTH 3 (THREE) TIMES DAILY BEFORE MEALS. AS NEEDED 270 capsule 3  . OZEMPIC, 0.25 OR 0.5 MG/DOSE, 2 MG/1.5ML SOPN INJECT 0.5 MG INTO THE SKIN ONCE A WEEK. 3 pen 8  . pantoprazole (PROTONIX) 40 MG tablet TAKE 1  TABLET BY MOUTH EVERY DAY 90 tablet 3  . Sodium Sulfate-Mag Sulfate-KCl (SUTAB) [phone] MG TABS Take 1 kit by mouth as directed. 24 tablet 0  . spironolactone (ALDACTONE) 25 MG tablet Take 1 tablet (25 mg total) by mouth daily. 30 tablet 5  . telmisartan (MICARDIS) 80 MG tablet TAKE 1 TABLET (80 MG TOTAL) BY MOUTH DAILY. UNTIL CAN GET LOSARTAN 90 tablet 0  . nicotine (NICODERM CQ - DOSED IN MG/24 HOURS) 21 mg/24hr patch Place 1 patch (21 mg total) onto the skin daily. (Patient not taking: Reported on 06/12/2020) 28 patch 0  . nicotine polacrilex ([employer] NICOTINE) 2 MG gum Take 1 each (2 mg total) by mouth as needed for smoking cessation (max 5 a day). (Patient not taking: Reported on 06/12/2020) 100 tablet 0  . ONE TOUCH LANCETS MISC Use to check blood sugars twice a day (Patient not taking: Reported on 06/12/2020) 200 each 0   No current facility-administered medications for this visit.     Objective:  Temp 97.9 F (36.6 C) (Oral)  self reported vitals Gen: NAD, resting comfortably Lungs: nonlabored, normal respiratory rate  Skin: appears dry, no obvious rash     Assessment and Plan   #  Cough, Congestion and Runny nose S: Patient stated that she started feeling sick yesterday. She stated that her son (he did not get tested for covid)was sick over the weekend and he was walking towards he coughed on her and she has been sick ever since. She stated that she has a cough, runny nose, post nasal drip and itchy eyes. She is able to bring up clear phlegm when she coughs- wt cough. She took otc Benadryl with no relief. Has not tried other meds- worries due to diabetes and blood pressure issues. Denies shortness of breath. No loss of taste or smell. No fever.  A/P: this could simply be an upper respiratory infection but want to rule out covid 19.  Symptomatic care- rest, stay well hydrated. Prefer zyrtec or claritin (no decongestant though)over benadryl. Recommended tylenol for headache- in frontal  area could be sinus pressure- discussed antibiotic if covid negative and symptoms no better within 10 days of start of illness.  - physical was rescheduled to next week - also strongly encouraged quitting smoking. She is such a sweet person and we want to reduce risk factors for heart disease, stroke, cancer as much as possible- also would help with quicker recovery from illnesses like this- still on wellbutrin  Patient with symptoms concerning for potential covid 19. Thankfully patient covid 19 vaccinated.  Therefore: - testing options discussed with patient 1. [facility] testing Monday and Thursday 5 30-7 30 PM (planned today) 2. Potential daytime testing 3. [email or web address] - recommended patient watch closely for shortness of breath or confusion or worsening symptoms and if those occur he should contact us immediately  Or seek emergent care -recommended patient consider purchasing pulse oximeter and if levels 94% or below  persistently- seek care at the hospital  -recommended self isolation until negative test  at minimum .  - for quarantine if covid 19 test positive would need to be at least 10 days since first symptom AND at least 24 hours fever free without fever reducing medications AND improvement in respiratory symptoms  - we also discussed close contacts would need 14 day quarantine after last close contact with patient IF patient is positive  -Hopeful results back within 48 hours of test but may take up to a week  - work note needed: No - with her risk factors even though covid 19 vaccinated I would still want to do covid 19 mAB infusion.   # hypertension- avoid decongestants. BP has been controlled.   Recommended follow up: hopefully physical next week Future Appointments  Date Time Provider [place]  09/05/2020  5:45 PM [facility] None  09/12/2020  2:40 PM Jarika Robben, Brayton Mars, MD [facility]    Lab/Order associations:   ICD-10-CM   1. Cough  R05.9   2. Nasal congestion  R09.81    Return precautions advised.  Garret Reddish, MD

## 2020-09-07 LAB — SARS-COV-2, NAA 2 DAY TAT

## 2020-09-07 LAB — NOVEL CORONAVIRUS, NAA: SARS-CoV-2, NAA: NOT DETECTED

## 2020-09-10 ENCOUNTER — Ambulatory Visit (INDEPENDENT_AMBULATORY_CARE_PROVIDER_SITE_OTHER): Payer: Managed Care, Other (non HMO) | Admitting: Emergency Medicine

## 2020-09-10 ENCOUNTER — Other Ambulatory Visit: Payer: Self-pay

## 2020-09-10 ENCOUNTER — Encounter: Payer: Self-pay | Admitting: Emergency Medicine

## 2020-09-10 VITALS — BP 134/80 | HR 56 | Temp 98.3°F | Ht 65.75 in | Wt 256.0 lb

## 2020-09-10 DIAGNOSIS — J449 Chronic obstructive pulmonary disease, unspecified: Secondary | ICD-10-CM

## 2020-09-10 DIAGNOSIS — Z23 Encounter for immunization: Secondary | ICD-10-CM | POA: Diagnosis not present

## 2020-09-10 DIAGNOSIS — J84115 Respiratory bronchiolitis interstitial lung disease: Secondary | ICD-10-CM | POA: Diagnosis not present

## 2020-09-10 DIAGNOSIS — G4730 Sleep apnea, unspecified: Secondary | ICD-10-CM | POA: Diagnosis not present

## 2020-09-10 MED ORDER — AZITHROMYCIN 250 MG PO TABS
ORAL_TABLET | ORAL | 0 refills | Status: DC
Start: 2020-09-10 — End: 2020-10-29

## 2020-09-10 MED ORDER — PREDNISONE 10 MG PO TABS
ORAL_TABLET | ORAL | 0 refills | Status: DC
Start: 2020-09-10 — End: 2020-10-29

## 2020-09-10 MED ORDER — ALBUTEROL SULFATE HFA 108 (90 BASE) MCG/ACT IN AERS
2.0000 | INHALATION_SPRAY | Freq: Four times a day (QID) | RESPIRATORY_TRACT | 6 refills | Status: DC | PRN
Start: 2020-09-10 — End: 2021-04-01

## 2020-09-10 NOTE — Progress Notes (Signed)
Subjective:    Patient ID: Lisa [name], female    DOB: 03-Oct-1961, 59 y.o.   MRN: [id number]  Shortness of Breath Pertinent negatives include no ear pain, fever, headaches, leg swelling, rash, rhinorrhea, sore throat, vomiting or wheezing.   HPI:   ROV 06/26/2019 --Ms. Goelz follows up for her history of tobacco use that associated with RB-ILD and COPD.  She also has OSA on CPAP.  I saw her in February after she was treated by her PCP for an acute flare.  She had continued cough, mild allergy symptoms, on Protonix.  She was smoking 20 cigarettes daily at the time.  I treated her with prednisone for a flare of her cough, try to ramp up her Protonix to twice a day, added loratadine.  She reports now that she has continued to smoke about 2 pks a day (increased). She is now off protonix altogether, off loratadine. She has albuterol, has not required. Good compliance w her CPAP machine. Good clinical benefit.   ROV 02/19/20 --follow-up visit for patient with a history of tobacco abuse, COPD, RB-ILD, OSA on CPAP.  She also has chronic cough in the setting of allergic rhinitis, GERD (on Protonix).  She uses albuterol rarely. She is walking three times a week. She coughs most days. She smokes 1.5 pk/day. She is interested in trying to stop or at least cut down. She is interested in using nicotine patches.  Great compliance with her CPAP, occasionally has some transient tightness in her chest when she wakes up  ROV 09/10/20 --59 year old woman, smoker with a history of COPD and RB-ILD.  She also has obstructive sleep apnea on CPAP, chronic cough due to the above as well as allergic rhinitis, GERD.  Her last dedicated CT chest was December 2019. She had been doing well until about 1 week ago - PND, then viral sx with HA, nausea, coughing. May be slowly improving - has chest discomfort with paroxysms of cough, clear to white mucous. Has not needed / used her albuterol. Has impacted her breathing some, not  severely. She was covid tested >> negative.  She is smoking 10 cig a day, was at a pack/day before this URI.  Wears her CPAP reliably. Better sleep, less daytime sleepiness.   Review of Systems  Constitutional: Negative for fever and unexpected weight change.  HENT: Negative for congestion, dental problem, ear pain, nosebleeds, postnasal drip, rhinorrhea, sinus pressure, sneezing, sore throat and trouble swallowing.   Eyes: Negative for redness and itching.  Respiratory: Negative for cough, chest tightness, shortness of breath and wheezing.   Cardiovascular: Negative for palpitations and leg swelling.  Gastrointestinal: Negative for nausea and vomiting.  Genitourinary: Negative for dysuria.  Musculoskeletal: Negative for joint swelling.  Skin: Negative for rash.  Neurological: Negative for headaches.  Hematological: Does not bruise/bleed easily.  Psychiatric/Behavioral: Negative for dysphoric mood. The patient is not nervous/anxious.        Objective:   Physical Exam Vitals:   09/10/20 1548  BP: 134/80  Pulse: (!) 56  Temp: 98.3 F (36.8 C)  TempSrc: Temporal  SpO2: 98%  Weight: 256 lb (116.1 kg)  Height: 5' 5.75" (1.67 m)   Gen: Pleasant, obese, in no distress,  normal affect  ENT: No lesions,  mouth clear, chronic L ptosis  Neck: No JVD, no stridor  Lungs: No use of accessory muscles, clear without rales or rhonchi  Cardiovascular: RRR, heart sounds normal, no murmur or gallops, trace peripheral edema  Musculoskeletal: No  deformities, no cyanosis or clubbing  Neuro: alert, non focal  Skin: Warm, no lesions or rashes       Assessment & Plan:  COPD (chronic obstructive pulmonary disease) ([place]) With a mild flare in setting URI, Covid negative. Mainly with residual cough. Her dyspnea is improving. I will treat her with a prednisone taper, azithromycin. She rarely uses albuterol but we will refill this. I want her to have it available  Respiratory bronchiolitis  associated interstitial lung disease ([place]) Unfortunately still smoking. This is driving the persistent inflammation. We will plan to repeat her CT chest in December. Talked her today about smoking cessation.  Sleep apnea Continue CPAP. Good clinical benefit, great compliance.  Baltazar Apo, MD, PhD 09/10/2020, 4:15 PM [facility] [phone] or if no answer [phone]

## 2020-09-10 NOTE — Assessment & Plan Note (Signed)
With a mild flare in setting URI, Covid negative. Mainly with residual cough. Her dyspnea is improving. I will treat her with a prednisone taper, azithromycin. She rarely uses albuterol but we will refill this. I want her to have it available

## 2020-09-10 NOTE — Patient Instructions (Addendum)
Continue to work hard on decreasing your cigarettes. Our ultimate goal is for you to stop altogether. Take prednisone as directed until completely gone Take azithromycin as directed until completely gone We will refill your albuterol inhaler. You can use 2 puffs if you develop shortness of breath, chest tightness, wheezing. We will repeat your CT scan of the chest in Dec 2021 to compare with your priors. Continue CPAP every night as you have been wearing it. Follow with Dr Lamonte Sakai in December after your CT so that we can review the results together.

## 2020-09-10 NOTE — Addendum Note (Signed)
Addended by: Gavin Potters R on: 09/10/2020 04:31 PM   Modules accepted: Orders

## 2020-09-10 NOTE — Assessment & Plan Note (Signed)
Unfortunately still smoking. This is driving the persistent inflammation. We will plan to repeat her CT chest in December. Talked her today about smoking cessation.

## 2020-09-10 NOTE — Addendum Note (Signed)
Addended by: Gavin Potters R on: 09/10/2020 04:49 PM   Modules accepted: Orders

## 2020-09-10 NOTE — Assessment & Plan Note (Signed)
Continue CPAP. Good clinical benefit, great compliance.

## 2020-09-11 NOTE — Patient Instructions (Addendum)
Please stop by lab before you go If you have mychart- we will send your results within 3 business days of [name] receiving them.  If you do not have mychart- we will call you about results within 5 business days of [name] receiving them.  *please note we are currently using [facility] which has a longer processing time than [place] typically so labs may not come back as quickly as in the past *please also note that you will see labs on mychart as soon as they post. I will later go in and write notes on them- will say "notes from Dr. Yong Channel"  Consider shingrix future visit  I would encourage pfizer booster vaccine 2 weeks after your flu shot at your pharmacy  Feel better soon! Thrilled you have no covid!   Recommended follow up: Return in about 6 months (around 03/13/2021) for follow up- or sooner if needed.

## 2020-09-11 NOTE — Progress Notes (Signed)
Phone [phone]   Subjective:  Patient presents today for their annual physical. Chief complaint-noted.   See problem oriented charting- ROS- full  review of systems was completed and negative except for: congestio, cough (covid neg being treated y pulm with azithromycin and prednisone), nausea, occasional painful sex, joint pain, back pain, muscle aches, seasonal allergie  The following were reviewed and entered/updated in epic: Past Medical History:  Diagnosis Date  . Allergic rhinitis   . Arthritis   . Asthma   . Breast nodule    right breast, to see dr Helane Rima 06-20-2012 for   . Chest pain, non-cardiac   . Chronic headaches   . Cigarette smoker   . Complication of anesthesia 05-17-2012   trouble breathing after colonscopy, needed nebulizer  . COPD (chronic obstructive pulmonary disease) ([place])   . Diastolic congestive heart failure ([place]) 04/17/2019  . Diverticulosis   . GERD (gastroesophageal reflux disease)   . Hepatic hemangioma   . Hypertension   . IBS (irritable bowel syndrome)   . Nuclear stress test    Myoview 05/2019: EF 63, no ischemia. Low Risk  . Obesity   . Prosthetic eye globe   . Pyloric erosion   . Sleep apnea    CPAP setting varies from 4-10  . Type 2 diabetes mellitus ([place]) 03/11/2017   Patient Active Problem List   Diagnosis Date Noted  . Diastolic congestive heart failure ([place]) 04/17/2019    Priority: High  . Type 2 diabetes mellitus ([place]) 03/11/2017    Priority: High  . PAD (peripheral artery disease) ([place]) 01/19/2017    Priority: High  . 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 Morbid obesity ([place]) 02/14/2016    Priority: High  . Tobacco use disorder 08/12/2015    Priority: High  . COPD (chronic obstructive pulmonary disease) ([place]) 01/18/2015    Priority: High  . Fatty liver 11/06/2019    Priority: Medium  . Hyperlipidemia 04/12/2018    Priority: Medium  . Hyperlipidemia  associated with type 2 diabetes mellitus ([place]) 09/06/2017    Priority: Medium  . Breast mass 08/03/2017    Priority: Medium  . GERD (gastroesophageal reflux disease) 02/14/2016    Priority: Medium  . DOE (dyspnea on exertion) 03/08/2014    Priority: Medium  . Migraine without aura 07/26/2013    Priority: Medium  . Sleep apnea 02/11/2012    Priority: Medium  . Chest discomfort 10/12/2011    Priority: Medium  . Hypertension associated with diabetes ([place]) 10/12/2011    Priority: Medium  . Chronic hip pain 07/29/2017    Priority: Low  . Left knee pain 05/15/2016    Priority: Low  . Flank pain 04/21/2016    Priority: Low  . Abdominal pain 02/28/2016    Priority: Low  . Pulmonary nodule, right 04/11/2015    Priority: Low  . Mediastinal lymphadenopathy 01/18/2015    Priority: Low  . Chronic rhinitis 06/26/2019  . Trigger finger, right middle finger 05/04/2019  . Coronary artery calcification 04/20/2019  . Bilateral leg pain 04/14/2018  . Pulmonary infiltrates   . S/P bronchoscopy with biopsy    Past Surgical History:  Procedure Laterality Date  . ABDOMINAL AORTOGRAM W/LOWER EXTREMITY N/A 04/30/2017   Procedure: Abdominal Aortogram w/Lower Extremity;  Surgeon: Elam Dutch, MD;  Location: [facility];  Service: Cardiovascular;  Laterality: N/A;  . CARDIAC CATHETERIZATION  12/28/2007   EF 75%. IT REVEALS NORMAL/SUPRANORMAL LEFT VENTRICULAR SYSTOLIC FUNCTION  .  CARDIOVASCULAR STRESS TEST  03/25/2007   EF 78%  . CESAREAN SECTION  2003, 2009   x 2  . CHOLECYSTECTOMY  1993  . COLONOSCOPY  2014  . ENDOMETRIAL ABLATION  09/2010   and D&C  . HERNIA REPAIR  5027   umbilical  . LAPAROSCOPY    . PERIPHERAL VASCULAR BALLOON ANGIOPLASTY Left 04/30/2017   Procedure: Peripheral Vascular Balloon Angioplasty;  Surgeon: Elam Dutch, MD;  Location: [facility];  Service: Cardiovascular;  Laterality: Left;  SFA  . TUBAL LIGATION    . UMBILICAL HERNIA REPAIR  2004  . US  ECHOCARDIOGRAPHY  05/08/2008   EF 55-60%  . VENTRAL HERNIA REPAIR  06/15/2012   Procedure: LAPAROSCOPIC VENTRAL HERNIA;  Surgeon: Edward Jolly, MD;  Location: [facility];  Service: General;  Laterality: N/A;  LAPAROSCOPIC REPAIR VENTRAL HERNIA  . VIDEO BRONCHOSCOPY Bilateral 11/15/2017   Procedure: VIDEO BRONCHOSCOPY WITH FLUORO;  Surgeon: Collene Gobble, MD;  Location: [facility];  Service: Cardiopulmonary;  Laterality: Bilateral;    Family History  Problem Relation Age of Onset  . Hypertension Mother   . Lung cancer Mother        35  . Diabetes Maternal Grandmother   . Heart disease Father        pacemaker or defibrillator  . Heart attack Paternal Uncle   . Breast cancer Other   . Colon cancer Neg Hx   . Esophageal cancer Neg Hx   . Pancreatic cancer Neg Hx     Medications- reviewed and updated Current Outpatient Medications  Medication Sig Dispense Refill  . albuterol (VENTOLIN HFA) 108 (90 Base) MCG/ACT inhaler Inhale 2 puffs into the lungs every 6 (six) hours as needed for wheezing or shortness of breath. 8 g 6  . Alirocumab (PRALUENT) 75 MG/ML SOAJ Inject 1 pen into the skin every 14 (fourteen) days. 6 mL 3  . aspirin EC 81 MG tablet Take 81 mg by mouth daily.    Marland Kitchen azithromycin (ZITHROMAX Z-PAK) 250 MG tablet Take 2 tabs today, then 1 tab until gone 6 each 0  . Bempedoic Acid-Ezetimibe (NEXLIZET) 180-10 MG TABS Take 1 tablet by mouth daily. 90 tablet 3  . buPROPion (WELLBUTRIN SR) 150 MG 12 hr tablet TAKE 1 TABLET BY MOUTH TWICE A DAY 180 tablet 0  . carvedilol (COREG) 12.5 MG tablet TAKE 1 TABLET BY MOUTH TWICE A DAY 60 tablet 5  . dicyclomine (BENTYL) 10 MG capsule TAKE 1 CAPSULE (10 MG TOTAL) BY MOUTH 3 (THREE) TIMES DAILY BEFORE MEALS. AS NEEDED 270 capsule 3  . ONE TOUCH LANCETS MISC Use to check blood sugars twice a day 200 each 0  . OZEMPIC, 0.25 OR 0.5 MG/DOSE, 2 MG/1.5ML SOPN INJECT 0.5 MG INTO THE SKIN ONCE A WEEK. 3 pen 8  . pantoprazole (PROTONIX) 40 MG tablet  TAKE 1 TABLET BY MOUTH EVERY DAY 90 tablet 3  . predniSONE (DELTASONE) 10 MG tablet Take 4 tablets X 3 days, 3 tabs X 3 days, 2 tabs x 3 days, 1 tab x 3 days 30 tablet 0  . Sodium Sulfate-Mag Sulfate-KCl (SUTAB) [phone] MG TABS Take 1 kit by mouth as directed. 24 tablet 0  . spironolactone (ALDACTONE) 25 MG tablet Take 1 tablet (25 mg total) by mouth daily. 30 tablet 5  . telmisartan (MICARDIS) 80 MG tablet TAKE 1 TABLET (80 MG TOTAL) BY MOUTH DAILY. UNTIL CAN GET LOSARTAN 90 tablet 0  . nicotine (NICODERM CQ - DOSED IN  MG/24 HOURS) 21 mg/24hr patch Place 1 patch (21 mg total) onto the skin daily. (Patient not taking: Reported on 09/10/2020) 28 patch 0  . nicotine polacrilex ([employer] NICOTINE) 2 MG gum Take 1 each (2 mg total) by mouth as needed for smoking cessation (max 5 a day). (Patient not taking: Reported on 09/10/2020) 100 tablet 0   No current facility-administered medications for this visit.    Allergies-reviewed and updated Allergies  Allergen Reactions  . Almond Oil Anaphylaxis and Nausea And Vomiting    Almond-Nuts Only  . Apple Anaphylaxis  . Ceftin [Cefuroxime] Other (See Comments)    Severe stomach pain - Diverticulitis flares up  . Cherry Extract Anaphylaxis    Cherry fruit only   . Other Anaphylaxis and Itching    Most fruits- Are NOT tolerated (PLEASE ASK PATIENT BEFORE GIVING, AS CERTAIN FRUITS ARE TOLERATED IN LIMITED QUANTITIES!!)  . Guaifenesin & Derivatives Other (See Comments)    Restlessness, jittery  . Hctz [Hydrochlorothiazide] Other (See Comments)    Causes a headache  . Naproxen Other (See Comments)    Insomnia  . Lisinopril Cough         Social History   Social History Narrative   Married 1997. 2 sons 3 and 32 years old in 10/18. Wants to see children grow up.       Customer Service/accounts payable.    Went to school for medical assisting- has had a hard time finding work      Hobbies: [employer], enjoying [place], getting out of home, wants to  take [employer]   Objective  Objective:  BP 132/74   Pulse 92   Temp 98.1 F (36.7 C) (Temporal)   Ht $R'5\' 5"'ws$  (1.651 m)   Wt 257 lb 6.4 oz (116.8 kg)   SpO2 98%   BMI 42.83 kg/m  Gen: NAD, resting comfortably HEENT: Mucous membranes are moist. Oropharynx normal Neck: no thyromegaly CV: RRR no murmurs rubs or gallops Lungs: diffuse wheeze noted- encouraged albuterol Abdomen: soft/nontender/nondistended/normal bowel sounds. No rebound or guarding.  Ext: no edema Skin: warm, dry Neuro: grossly normal, moves all extremities, PERRLA   Assessment and Plan   59 y.o. female presenting for annual physical.  Health Maintenance counseling: 1. Anticipatory guidance: Patient counseled regarding regular dental exams -q6 months encouraged, eye exams- yearly,  avoiding smoking and second hand smoke- see below , limiting alcohol to 1 beverage per day- no alcohol, no illicit drugs 2. Risk factor reduction:  Advised patient of need for regular exercise and diet rich and fruits and vegetables to reduce risk of heart attack and stroke. Exercise- limited by respiratory issues currently- 2 days a week walking 30 minutes- goal 4 days a week and stretch to 35-40 minutes in long run- prefers to walk on track. Diet-down 20 lbs with cutting out red meat, cut down on breads .  Wt Readings from Last 3 Encounters:  09/12/20 257 lb 6.4 oz (116.8 kg)  09/10/20 256 lb (116.1 kg)  06/12/20 254 lb (115.2 kg)  3. Immunizations/screenings/ancillary studies- discussed shingrix I would encourage pfizer booster vaccine 2 weeks after your flu shot at your pharmacy Immunization History  Administered Date(s) Administered  . Influenza Split 12/31/2013, 08/30/2014  . Influenza,inj,Quad PF,6+ Mos 08/12/2015, 08/21/2016, 09/06/2017, 09/14/2018, 09/01/2019, 09/10/2020  . PFIZER SARS-COV-2 Vaccination 02/22/2020, 03/18/2020  . PPD Test 03/04/2012  . Pneumococcal Polysaccharide-23 06/16/2012  . Tdap 03/04/2012  4. Cervical  cancer screening- 05/24/18 pap smear. Wants to establish with Dr. Runell Gess again-  referred now 5. Breast cancer screening- mammogram 10/23/2019- due later this year 6. Colon cancer screening - 06/12/20 with 3 year repeat planned 7. Skin cancer screening- lower risk due to melanin content. advised regular sunscreen use. Denies worrisome, changing, or new skin lesions.  8. Birth control/STD check- post menopause and monogamous 9. Osteoporosis screening at 28-  We will plan on this 10. Current smoker-  10 cigarettes per day would normally be pack a day - encouraged full cessation. Enrolled in lung cancer screening program with Dr. Lamonte Sakai- scheduled for December. Encouraged her to try pashes  Status of chronic or acute concerns   #hyperlipidemia S: Medication:praluent , also bempedoic acid- ezetemibe Lab Results  Component Value Date   CHOL 104 06/19/2020   HDL 25 (L) 06/19/2020   LDLCALC 57 06/19/2020   TRIG 121 06/19/2020   CHOLHDL 4.2 06/19/2020   A/P: excellent in July- continue current meds  # Diabetes S: Medication: Ozempic 1.5ML Prednisone can raise sugars Exercise and diet- twice week walking, weight loss 20 lbs in a year!  Lab Results  Component Value Date   HGBA1C 5.9 (A) 05/03/2020   HGBA1C 6.1 (A) 10/11/2019   HGBA1C 6.6 (H) 04/19/2019    A/P: hopefully controlle-d update a1c today  # GERD S:Medication: Protonix 40mg   Through Dr. Fuller Plan B12 levels related to PPI use: Lab Results  Component Value Date   VITAMINB12 729 04/22/2017  A/P: on long term PPI through GI- update b12 with long term use  - also on Bentyl through Dr. Fuller Plan as well  #hypertension S: medication: Carvedilol 12.5Mg  BID , Spironolactone 25Mg ,  telmisartan 80Mg  BP Readings from Last 3 Encounters:  09/12/20 132/74  09/10/20 134/80  06/12/20 (!) 97/41  A/P: Stable. Continue current medications.   # recent respiratory issues- on prednisone and azithromycin through pulmonary. Has albuterol if needed for  her COPD- encouraged her to try with her illness  Recommended follow up: Return in about 6 months (around 03/13/2021) for follow up- or sooner if needed. if a1c controlled  Lab/Order associations:Non fasting   ICD-10-CM   1. Preventative health care  Z00.00 CBC with Differential/Platelet    COMPLETE METABOLIC PANEL WITH GFR    Ambulatory referral to Gynecology    Hemoglobin A1c    Vitamin B12  2. Hypertension associated with diabetes ([place])  E11.59 CBC with Differential/Platelet   A25.0 COMPLETE METABOLIC PANEL WITH GFR  3. Gastroesophageal reflux disease without esophagitis  K21.9   4. Hyperlipidemia associated with type 2 diabetes mellitus (HCC)  E11.69 CBC with Differential/Platelet   N39.7 COMPLETE METABOLIC PANEL WITH GFR  5. Type 2 diabetes mellitus with hyperglycemia, without long-term current use of insulin (HCC)  E11.65 Hemoglobin A1c  6. Hyperlipidemia, unspecified hyperlipidemia type  E78.5   7. Screening for cervical cancer  Z12.4 Ambulatory referral to Gynecology  8. High risk medication use  Z79.899 Vitamin B12  9. Tobacco use disorder  F17.200    Return precautions advised.  Garret Reddish, MD

## 2020-09-12 ENCOUNTER — Ambulatory Visit (INDEPENDENT_AMBULATORY_CARE_PROVIDER_SITE_OTHER): Payer: Managed Care, Other (non HMO) | Admitting: Family Medicine

## 2020-09-12 ENCOUNTER — Other Ambulatory Visit: Payer: Self-pay

## 2020-09-12 ENCOUNTER — Encounter: Payer: Self-pay | Admitting: Family Medicine

## 2020-09-12 VITALS — BP 132/74 | HR 92 | Temp 98.1°F | Ht 65.0 in | Wt 257.4 lb

## 2020-09-12 DIAGNOSIS — E1159 Type 2 diabetes mellitus with other circulatory complications: Secondary | ICD-10-CM

## 2020-09-12 DIAGNOSIS — Z0000 Encounter for general adult medical examination without abnormal findings: Secondary | ICD-10-CM | POA: Diagnosis not present

## 2020-09-12 DIAGNOSIS — I152 Hypertension secondary to endocrine disorders: Secondary | ICD-10-CM

## 2020-09-12 DIAGNOSIS — E1165 Type 2 diabetes mellitus with hyperglycemia: Secondary | ICD-10-CM

## 2020-09-12 DIAGNOSIS — Z79899 Other long term (current) drug therapy: Secondary | ICD-10-CM

## 2020-09-12 DIAGNOSIS — K219 Gastro-esophageal reflux disease without esophagitis: Secondary | ICD-10-CM

## 2020-09-12 DIAGNOSIS — E1169 Type 2 diabetes mellitus with other specified complication: Secondary | ICD-10-CM

## 2020-09-12 DIAGNOSIS — F17200 Nicotine dependence, unspecified, uncomplicated: Secondary | ICD-10-CM

## 2020-09-12 DIAGNOSIS — Z124 Encounter for screening for malignant neoplasm of cervix: Secondary | ICD-10-CM

## 2020-09-12 DIAGNOSIS — E785 Hyperlipidemia, unspecified: Secondary | ICD-10-CM

## 2020-09-13 LAB — CBC WITH DIFFERENTIAL/PLATELET
Absolute Monocytes: 634 cells/uL (ref 200–950)
Basophils Absolute: 26 cells/uL (ref 0–200)
Basophils Relative: 0.3 %
Eosinophils Absolute: 132 cells/uL (ref 15–500)
Eosinophils Relative: 1.5 %
HCT: 44.1 % (ref 35.0–45.0)
Hemoglobin: 14.6 g/dL (ref 11.7–15.5)
Lymphs Abs: 2869 cells/uL (ref 850–3900)
MCH: 30.2 pg (ref 27.0–33.0)
MCHC: 33.1 g/dL (ref 32.0–36.0)
MCV: 91.1 fL (ref 80.0–100.0)
MPV: 10.3 fL (ref 7.5–12.5)
Monocytes Relative: 7.2 %
Neutro Abs: 5139 cells/uL (ref 1500–7800)
Neutrophils Relative %: 58.4 %
Platelets: 314 10*3/uL (ref 140–400)
RBC: 4.84 10*6/uL (ref 3.80–5.10)
RDW: 12 % (ref 11.0–15.0)
Total Lymphocyte: 32.6 %
WBC: 8.8 10*3/uL (ref 3.8–10.8)

## 2020-09-13 LAB — COMPLETE METABOLIC PANEL WITH GFR
AG Ratio: 1.2 (calc) (ref 1.0–2.5)
ALT: 45 U/L — ABNORMAL HIGH (ref 6–29)
AST: 28 U/L (ref 10–35)
Albumin: 4.1 g/dL (ref 3.6–5.1)
Alkaline phosphatase (APISO): 67 U/L (ref 37–153)
BUN/Creatinine Ratio: 16 (calc) (ref 6–22)
BUN: 22 mg/dL (ref 7–25)
CO2: 27 mmol/L (ref 20–32)
Calcium: 9.7 mg/dL (ref 8.6–10.4)
Chloride: 109 mmol/L (ref 98–110)
Creat: 1.37 mg/dL — ABNORMAL HIGH (ref 0.50–1.05)
GFR, Est African American: 49 mL/min/{1.73_m2} — ABNORMAL LOW (ref >=60)
GFR, Est Non African American: 43 mL/min/{1.73_m2} — ABNORMAL LOW (ref >=60)
Globulin: 3.4 g/dL (calc) (ref 1.9–3.7)
Glucose, Bld: 99 mg/dL (ref 65–99)
Potassium: 4 mmol/L (ref 3.5–5.3)
Sodium: 144 mmol/L (ref 135–146)
Total Bilirubin: 0.4 mg/dL (ref 0.2–1.2)
Total Protein: 7.5 g/dL (ref 6.1–8.1)

## 2020-09-13 LAB — HEMOGLOBIN A1C
Hgb A1c MFr Bld: 5.9 % of total Hgb — ABNORMAL HIGH (ref <5.7)
Mean Plasma Glucose: 123 (calc)
eAG (mmol/L): 6.8 (calc)

## 2020-09-13 LAB — VITAMIN B12: Vitamin B-12: 585 pg/mL (ref 200–1100)

## 2020-09-17 NOTE — Progress Notes (Signed)
Phone [phone] Virtual visit via Video note   Subjective:  Chief complaint: Chief Complaint  Patient presents with  . Results   This visit type was conducted due to national recommendations for restrictions regarding the COVID-19 Pandemic (e.g. social distancing).  This format is felt to be most appropriate for this patient at this time balancing risks to patient and risks to population by having him in for in person visit.  No physical exam was performed (except for noted visual exam or audio findings with Telehealth visits).    Our team/I connected with Lisa [name] at  9:40 AM EDT by a video enabled telemedicine application (doxy.me or caregility through epic) and verified that I am speaking with the correct person using two identifiers.  Location patient: [facility] Location provider: [facility], office Persons participating in the virtual visit:  patient  Our team/I discussed the limitations of evaluation and management by telemedicine and the availability of in person appointments. In light of current covid-19 pandemic, patient also understands that we are trying to protect them by minimizing in office contact if at all possible.  The patient expressed consent for telemedicine visit and agreed to proceed. Patient understands insurance will be billed.   Past Medical History-  Patient Active Problem List   Diagnosis Date Noted  . Diastolic congestive heart failure ([place]) 04/17/2019    Priority: High  . Type 2 diabetes mellitus ([place]) 03/11/2017    Priority: High  . PAD (peripheral artery disease) ([place]) 01/19/2017    Priority: High  . 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) 01/14/2017    Priority: High  . Morbid obesity ([place]) 02/14/2016    Priority: High  . Tobacco use disorder 08/12/2015    Priority: High  . COPD (chronic obstructive pulmonary disease) ([place]) 01/18/2015    Priority: High  . CKD (chronic kidney disease), stage III ([place])  09/18/2020    Priority: Medium  . Fatty liver 11/06/2019    Priority: Medium  . Hyperlipidemia 04/12/2018    Priority: Medium  . Hyperlipidemia associated with type 2 diabetes mellitus ([place]) 09/06/2017    Priority: Medium  . Breast mass 08/03/2017    Priority: Medium  . GERD (gastroesophageal reflux disease) 02/14/2016    Priority: Medium  . DOE (dyspnea on exertion) 03/08/2014    Priority: Medium  . Migraine without aura 07/26/2013    Priority: Medium  . Sleep apnea 02/11/2012    Priority: Medium  . Chest discomfort 10/12/2011    Priority: Medium  . Hypertension associated with diabetes ([place]) 10/12/2011    Priority: Medium  . Chronic hip pain 07/29/2017    Priority: Low  . Left knee pain 05/15/2016    Priority: Low  . Flank pain 04/21/2016    Priority: Low  . Abdominal pain 02/28/2016    Priority: Low  . Pulmonary nodule, right 04/11/2015    Priority: Low  . Mediastinal lymphadenopathy 01/18/2015    Priority: Low  . Chronic rhinitis 06/26/2019  . Trigger finger, right middle finger 05/04/2019  . Coronary artery calcification 04/20/2019  . Bilateral leg pain 04/14/2018  . Pulmonary infiltrates   . S/P bronchoscopy with biopsy     Medications- reviewed and updated Current Outpatient Medications  Medication Sig Dispense Refill  . albuterol (VENTOLIN HFA) 108 (90 Base) MCG/ACT inhaler Inhale 2 puffs into the lungs every 6 (six) hours as needed for wheezing or shortness of breath. 8 g 6  . Alirocumab (PRALUENT) 75 MG/ML SOAJ Inject 1 pen into the  skin every 14 (fourteen) days. 6 mL 3  . aspirin EC 81 MG tablet Take 81 mg by mouth daily.    . Bempedoic Acid-Ezetimibe (NEXLIZET) 180-10 MG TABS Take 1 tablet by mouth daily. 90 tablet 3  . buPROPion (WELLBUTRIN SR) 150 MG 12 hr tablet TAKE 1 TABLET BY MOUTH TWICE A DAY 180 tablet 0  . carvedilol (COREG) 12.5 MG tablet TAKE 1 TABLET BY MOUTH TWICE A DAY 60 tablet 5  . dicyclomine (BENTYL) 10 MG capsule TAKE 1 CAPSULE (10 MG  TOTAL) BY MOUTH 3 (THREE) TIMES DAILY BEFORE MEALS. AS NEEDED 270 capsule 3  . ONE TOUCH LANCETS MISC Use to check blood sugars twice a day 200 each 0  . OZEMPIC, 0.25 OR 0.5 MG/DOSE, 2 MG/1.5ML SOPN INJECT 0.5 MG INTO THE SKIN ONCE A WEEK. 3 pen 8  . pantoprazole (PROTONIX) 40 MG tablet TAKE 1 TABLET BY MOUTH EVERY DAY 90 tablet 3  . Sodium Sulfate-Mag Sulfate-KCl (SUTAB) [phone] MG TABS Take 1 kit by mouth as directed. 24 tablet 0  . spironolactone (ALDACTONE) 25 MG tablet Take 1 tablet (25 mg total) by mouth daily. 30 tablet 5  . telmisartan (MICARDIS) 80 MG tablet TAKE 1 TABLET (80 MG TOTAL) BY MOUTH DAILY. UNTIL CAN GET LOSARTAN 90 tablet 0  . azithromycin (ZITHROMAX Z-PAK) 250 MG tablet Take 2 tabs today, then 1 tab until gone 6 each 0  . nicotine (NICODERM CQ - DOSED IN MG/24 HOURS) 21 mg/24hr patch Place 1 patch (21 mg total) onto the skin daily. (Patient not taking: Reported on 09/10/2020) 28 patch 0  . nicotine polacrilex ([employer] NICOTINE) 2 MG gum Take 1 each (2 mg total) by mouth as needed for smoking cessation (max 5 a day). (Patient not taking: Reported on 09/10/2020) 100 tablet 0  . predniSONE (DELTASONE) 10 MG tablet Take 4 tablets X 3 days, 3 tabs X 3 days, 2 tabs x 3 days, 1 tab x 3 days 30 tablet 0   No current facility-administered medications for this visit.     Objective:  BP 117/68   Pulse 82   Temp 97.7 F (36.5 C)  self reported vitals Gen: NAD, resting comfortably Lungs: nonlabored, normal respiratory rate  Skin: appears dry, no obvious rash     Assessment and Plan   #Chronic kidney disease stage III S: GFR is typically in the 40srange in last 6 months - discussed spironolactone not ideal but important for BP,  telmisartan actually may preserve renal function (advised to hold these if unable to keep down fluids such as GI bug), aspirin but needed with PAD -Patient knows to avoid NSAIDs , PPIs if possible (she is going to finish 40mg  and then could go down to  20mg - just filled #90) A/P: discussed diagnosis in depth and what can be done  . Continue current meds other than trying to cut down on PPI and then possibly to pepcid If does well- she soul reach out when getting towards end of 40mg   #hypertension S: medication: spironolactone 25mg , telmisartan 80mg , coreg 12.5mg  BID BP Readings from Last 3 Encounters:  09/18/20 117/68  09/12/20 132/74  09/10/20 134/80  A/P: well controlled- continue current meds. Would likely reduce spironolactone if able to get BP down and blood pressure better  #Fatty liver- ast slightly high on last labs- discussed healthy eating/regular exercise  #Tobacco abuse-strongly encourage cessation-patient knows the importance but is not ready at this present time  Recommended follow up: keep April visit Future  Appointments  Date Time Provider [place]  03/21/2021 11:20 AM Marin Olp, MD [facility]   Lab/Order associations:   ICD-10-CM   1. Fatty liver  K76.0   2. Stage 3 chronic kidney disease, unspecified whether stage 3a or 3b CKD (HCC)  N18.30   3. Hypertension associated with diabetes ([place])  E11.59    I15.2    Return precautions advised.  Garret Reddish, MD

## 2020-09-17 NOTE — Patient Instructions (Signed)
Depression screen [facility] 2/9 05/03/2020 12/20/2018 06/21/2018  Decreased Interest 0 0 0  Down, Depressed, Hopeless 0 1 0  PHQ - 2 Score 0 1 0  Altered sleeping 0 - -  Tired, decreased energy 0 - -  Change in appetite 0 - -  Feeling bad or failure about yourself  0 - -  Trouble concentrating 0 - -  Moving slowly or fidgety/restless 0 - -  Suicidal thoughts 0 - -  PHQ-9 Score 0 - -  Difficult doing work/chores Not difficult at all - -  Some recent data might be hidden

## 2020-09-18 ENCOUNTER — Telehealth (INDEPENDENT_AMBULATORY_CARE_PROVIDER_SITE_OTHER): Payer: Managed Care, Other (non HMO) | Admitting: Family Medicine

## 2020-09-18 ENCOUNTER — Encounter: Payer: Self-pay | Admitting: Family Medicine

## 2020-09-18 VITALS — BP 117/68 | HR 82 | Temp 97.7°F

## 2020-09-18 DIAGNOSIS — N1831 Chronic kidney disease, stage 3a: Secondary | ICD-10-CM | POA: Insufficient documentation

## 2020-09-18 DIAGNOSIS — E1159 Type 2 diabetes mellitus with other circulatory complications: Secondary | ICD-10-CM | POA: Diagnosis not present

## 2020-09-18 DIAGNOSIS — I152 Hypertension secondary to endocrine disorders: Secondary | ICD-10-CM | POA: Diagnosis not present

## 2020-09-18 DIAGNOSIS — N183 Chronic kidney disease, stage 3 unspecified: Secondary | ICD-10-CM | POA: Diagnosis not present

## 2020-09-18 DIAGNOSIS — K760 Fatty (change of) liver, not elsewhere classified: Secondary | ICD-10-CM | POA: Diagnosis not present

## 2020-10-07 ENCOUNTER — Ambulatory Visit: Payer: Managed Care, Other (non HMO) | Attending: Internal Medicine

## 2020-10-07 DIAGNOSIS — Z23 Encounter for immunization: Secondary | ICD-10-CM

## 2020-10-07 NOTE — Progress Notes (Signed)
   Covid-19 Vaccination Clinic  Name:  Lisa [name]    MRN: [id number] DOB: 12-05-1960  10/07/2020  Lisa [name] was observed post Covid-19 immunization for 15 minutes without incident. She was provided with Vaccine Information Sheet and instruction to access the V-Safe system.   Lisa [name] was instructed to call 911 with any severe reactions post vaccine: Marland Kitchen Difficulty breathing  . Swelling of face and throat  . A fast heartbeat  . A bad rash all over body  . Dizziness and weakness

## 2020-10-11 ENCOUNTER — Other Ambulatory Visit: Payer: Self-pay | Admitting: Gastroenterology

## 2020-10-22 ENCOUNTER — Telehealth: Payer: Self-pay

## 2020-10-22 NOTE — Telephone Encounter (Signed)
Patient called c/o 9/10 pain down entire right leg for about a week and a half - sometimes relieved by walking or sitting in the correct position. Says toes will go numb. Denies color change or temperature change. Scheduled for f/u ABI's art duplex and MD visit.

## 2020-10-25 ENCOUNTER — Other Ambulatory Visit: Payer: Self-pay | Admitting: Family Medicine

## 2020-10-28 ENCOUNTER — Telehealth: Payer: Self-pay | Admitting: Gastroenterology

## 2020-10-28 NOTE — Telephone Encounter (Signed)
Left message for patient to call back  

## 2020-10-28 NOTE — Progress Notes (Signed)
Phone [phone] Virtual visit via Video note   Subjective:  Chief complaint: Chief Complaint  Patient presents with  . Hip Pain   This visit type was conducted due to national recommendations for restrictions regarding the COVID-19 Pandemic (e.g. social distancing).  This format is felt to be most appropriate for this patient at this time balancing risks to patient and risks to population by having him in for in person visit.  No physical exam was performed (except for noted visual exam or audio findings with Telehealth visits).    Our team/I connected with [name] at 11:00 AM EST by a video enabled telemedicine application (doxy.me or caregility through epic) and verified that I am speaking with the correct person using two identifiers. We had visual difficulties on patient end of the call but maintained call through platform.  Location patient: [place] Location provider: [facility], office Persons participating in the virtual visit:  patient  Our team/I discussed the limitations of evaluation and management by telemedicine and the availability of in person appointments. In light of current covid-19 pandemic, patient also understands that we are trying to protect them by minimizing in office contact if at all possible.  The patient expressed consent for telemedicine visit and agreed to proceed. Patient understands insurance will be billed.   Past Medical History-  Patient Active Problem List   Diagnosis Date Noted  . Diastolic congestive heart failure ([place]) 04/17/2019    Priority: High  . Type 2 diabetes mellitus ([place]) 03/11/2017    Priority: High  . PAD (peripheral artery disease) ([place]) 01/19/2017    Priority: High  . 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 Morbid obesity ([place]) 02/14/2016    Priority: High  . Tobacco use disorder 08/12/2015    Priority: High  . COPD (chronic obstructive pulmonary disease)  ([place]) 01/18/2015    Priority: High  . CKD (chronic kidney disease), stage III ([place]) 09/18/2020    Priority: Medium  . Fatty liver 11/06/2019    Priority: Medium  . Hyperlipidemia 04/12/2018    Priority: Medium  . Hyperlipidemia associated with type 2 diabetes mellitus ([place]) 09/06/2017    Priority: Medium  . Breast mass 08/03/2017    Priority: Medium  . GERD (gastroesophageal reflux disease) 02/14/2016    Priority: Medium  . DOE (dyspnea on exertion) 03/08/2014    Priority: Medium  . Migraine without aura 07/26/2013    Priority: Medium  . Sleep apnea 02/11/2012    Priority: Medium  . Chest discomfort 10/12/2011    Priority: Medium  . Hypertension associated with diabetes ([place]) 10/12/2011    Priority: Medium  . Chronic hip pain 07/29/2017    Priority: Low  . Left knee pain 05/15/2016    Priority: Low  . Flank pain 04/21/2016    Priority: Low  . Abdominal pain 02/28/2016    Priority: Low  . Pulmonary nodule, right 04/11/2015    Priority: Low  . Mediastinal lymphadenopathy 01/18/2015    Priority: Low  . Chronic rhinitis 06/26/2019  . Trigger finger, right middle finger 05/04/2019  . Coronary artery calcification 04/20/2019  . Bilateral leg pain 04/14/2018  . Pulmonary infiltrates   . S/P bronchoscopy with biopsy     Medications- reviewed and updated Current Outpatient Medications  Medication Sig Dispense Refill  . albuterol (VENTOLIN HFA) 108 (90 Base) MCG/ACT inhaler Inhale 2 puffs into the lungs every 6 (six) hours as needed for wheezing or shortness of breath.  8 g 6  . Alirocumab (PRALUENT) 75 MG/ML SOAJ Inject 1 pen into the skin every 14 (fourteen) days. 6 mL 3  . aspirin EC 81 MG tablet Take 81 mg by mouth daily.    . Bempedoic Acid-Ezetimibe (NEXLIZET) 180-10 MG TABS Take 1 tablet by mouth daily. 90 tablet 3  . buPROPion (WELLBUTRIN SR) 150 MG 12 hr tablet TAKE 1 TABLET BY MOUTH TWICE A DAY 180 tablet 0  . carvedilol (COREG) 12.5 MG tablet TAKE 1 TABLET BY MOUTH  TWICE A DAY 60 tablet 5  . dicyclomine (BENTYL) 10 MG capsule TAKE 1 CAPSULE (10 MG TOTAL) BY MOUTH 3 (THREE) TIMES DAILY BEFORE MEALS. AS NEEDED 270 capsule 3  . ONE TOUCH LANCETS MISC Use to check blood sugars twice a day 200 each 0  . OZEMPIC, 0.25 OR 0.5 MG/DOSE, 2 MG/1.5ML SOPN INJECT 0.5 MG INTO THE SKIN ONCE A WEEK. 3 pen 8  . pantoprazole (PROTONIX) 40 MG tablet TAKE 1 TABLET BY MOUTH EVERY DAY 90 tablet 3  . Sodium Sulfate-Mag Sulfate-KCl (SUTAB) [phone] MG TABS Take 1 kit by mouth as directed. 24 tablet 0  . telmisartan (MICARDIS) 80 MG tablet TAKE 1 TABLET (80 MG TOTAL) BY MOUTH DAILY. UNTIL CAN GET LOSARTAN 90 tablet 0  . nicotine (NICODERM CQ - DOSED IN MG/24 HOURS) 21 mg/24hr patch Place 1 patch (21 mg total) onto the skin daily. (Patient not taking: Reported on 09/10/2020) 28 patch 0  . nicotine polacrilex ([employer] NICOTINE) 2 MG gum Take 1 each (2 mg total) by mouth as needed for smoking cessation (max 5 a day). (Patient not taking: Reported on 09/10/2020) 100 tablet 0  . predniSONE (DELTASONE) 50 MG tablet Take 1 tablet (50 mg total) by mouth daily with breakfast. 5 tablet 0  . spironolactone (ALDACTONE) 25 MG tablet TAKE 1 TABLET BY MOUTH EVERY DAY 90 tablet 1   No current facility-administered medications for this visit.     Objective:  No  self reported vitals Gen: NAD    Assessment and Plan   # Right Hip Pain/groin pain/pain in the leg/paresthesia in right foot S:Patient mentioned that pain starts in her groin area and moves up her right thigh and leg and down into the leg. Pain rated as severe.   She stated that it started last Tuesday about a week ago, she mentioned that last Wednesday the pain had gotten increasingly worse. She stated in the morning it is hard for her stand on her right leg. She has tried otc motrin, ibuprofen and tylenol and warm baths. Pain starts in the groin each day then moves to outer/lateral hip then moves down into the leg and gets tingling  sensation into the foot intermittently. Worse in the morning.   Monday night before this was having normal sex with husband but no fall or injury.   ROS-No falls/injury/blood in the urine. No saddle anesthesia, bladder incontinence, fecal incontinence, weakness in extremity, No history of cancer, fever, chills, unintentional weight loss, recent bacterial infection, recent IV drug use, HIV, pain worse at night  (unless she was moving at night) or while supine.     A/P: 59 year old female with right hip pain/right groin pain radiating down her leg with numbness and tingling in the right foot concerning for potential radiculopathy.  NSAIDs contraindicated due to history of CKD stage III-recommended patient avoid these.  A1c has been well controlled so I think a trial of prednisone is reasonable.  No red flags at this  time.  If symptoms fail to improve discussed possible sports medicine referral.  Would consider x-rays at that time-did not complete right now without red flags -Patient request avoiding prednisone taper-she states the regimen can become confusing to her so we opted for 50 mg for 5 days  #CKD stage III.  Has been stable.  As above recommended avoiding NSAIDs  #Diabetes mellitus-well-controlled on last check-she is concerned Ozempic could contribute to constipation-we did not change therapy plan at this time-reassess at next visit.  As noted I think it is okay to trial prednisone with excellent A1c control Lab Results  Component Value Date   HGBA1C 5.9 (H) 09/12/2020   HGBA1C 5.9 (A) 05/03/2020   HGBA1C 6.1 (A) 10/11/2019    Recommended follow up: If symptoms fail to improve Future Appointments  Date Time Provider [place]  10/31/2020  8:00 AM [facility]-CV HS VASC 2 - MC [facility] VVS  10/31/2020  9:00 AM [facility]-CV HS VASC 2 - MC [facility] VVS  10/31/2020 10:40 AM Elam Dutch, MD [facility] V[facility]  10/31/2020  1:30 PM LBCT-CT 1 LBCT-CT LB-CT [facility]  03/21/2021 11:20 AM Yong Channel, Brayton Mars, MD  [facility] P[facility]    Lab/Order associations:   ICD-10-CM   1. Right lumbar radiculopathy  M54.16   2. Type 2 diabetes mellitus with hyperglycemia, without long-term current use of insulin (HCC)  E11.65   3. Stage 3 chronic kidney disease, unspecified whether stage 3a or 3b CKD ([place])  N18.30     Meds ordered this encounter  Medications  . predniSONE (DELTASONE) 50 MG tablet    Sig: Take 1 tablet (50 mg total) by mouth daily with breakfast.    Dispense:  5 tablet    Refill:  0    Return precautions advised.  Garret Reddish, MD

## 2020-10-29 ENCOUNTER — Telehealth (INDEPENDENT_AMBULATORY_CARE_PROVIDER_SITE_OTHER): Payer: Managed Care, Other (non HMO) | Admitting: Family Medicine

## 2020-10-29 ENCOUNTER — Other Ambulatory Visit: Payer: Self-pay

## 2020-10-29 ENCOUNTER — Other Ambulatory Visit: Payer: Self-pay | Admitting: Family Medicine

## 2020-10-29 ENCOUNTER — Encounter: Payer: Self-pay | Admitting: Family Medicine

## 2020-10-29 DIAGNOSIS — N183 Chronic kidney disease, stage 3 unspecified: Secondary | ICD-10-CM

## 2020-10-29 DIAGNOSIS — E1165 Type 2 diabetes mellitus with hyperglycemia: Secondary | ICD-10-CM

## 2020-10-29 DIAGNOSIS — M5416 Radiculopathy, lumbar region: Secondary | ICD-10-CM | POA: Diagnosis not present

## 2020-10-29 DIAGNOSIS — R748 Abnormal levels of other serum enzymes: Secondary | ICD-10-CM

## 2020-10-29 MED ORDER — PREDNISONE 50 MG PO TABS: 50.0000 mg | ORAL_TABLET | Freq: Every day | ORAL | 0 refills | Status: DC

## 2020-10-29 NOTE — Patient Instructions (Signed)
Health Maintenance Due  Topic Date Due  . FOOT EXAM  10/10/2020    Depression screen [facility] 2/9 05/03/2020 12/20/2018 06/21/2018  Decreased Interest 0 0 0  Down, Depressed, Hopeless 0 1 0  PHQ - 2 Score 0 1 0  Altered sleeping 0 - -  Tired, decreased energy 0 - -  Change in appetite 0 - -  Feeling bad or failure about yourself  0 - -  Trouble concentrating 0 - -  Moving slowly or fidgety/restless 0 - -  Suicidal thoughts 0 - -  PHQ-9 Score 0 - -  Difficult doing work/chores Not difficult at all - -  Some recent data might be hidden    Recommended follow up: No follow-ups on file.

## 2020-10-30 ENCOUNTER — Other Ambulatory Visit: Payer: Self-pay | Admitting: Gastroenterology

## 2020-10-31 ENCOUNTER — Ambulatory Visit (INDEPENDENT_AMBULATORY_CARE_PROVIDER_SITE_OTHER): Payer: Managed Care, Other (non HMO) | Admitting: Vascular Surgery

## 2020-10-31 ENCOUNTER — Other Ambulatory Visit: Payer: Self-pay

## 2020-10-31 ENCOUNTER — Ambulatory Visit (INDEPENDENT_AMBULATORY_CARE_PROVIDER_SITE_OTHER)
Admission: RE | Admit: 2020-10-31 | Discharge: 2020-10-31 | Disposition: A | Discharge status: Home / Self Care | Payer: Managed Care, Other (non HMO) | Source: Ambulatory Visit | Attending: Emergency Medicine | Admitting: Emergency Medicine

## 2020-10-31 ENCOUNTER — Ambulatory Visit (HOSPITAL_COMMUNITY)
Admission: RE | Admit: 2020-10-31 | Discharge: 2020-10-31 | Disposition: A | Discharge status: Home / Self Care | Payer: Managed Care, Other (non HMO) | Source: Ambulatory Visit | Attending: Vascular Surgery | Admitting: Vascular Surgery

## 2020-10-31 ENCOUNTER — Ambulatory Visit (INDEPENDENT_AMBULATORY_CARE_PROVIDER_SITE_OTHER)
Admission: RE | Admit: 2020-10-31 | Discharge: 2020-10-31 | Disposition: A | Discharge status: Home / Self Care | Payer: Managed Care, Other (non HMO) | Source: Ambulatory Visit | Attending: Vascular Surgery | Admitting: Vascular Surgery

## 2020-10-31 ENCOUNTER — Encounter: Payer: Self-pay | Admitting: Vascular Surgery

## 2020-10-31 VITALS — BP 115/70 | HR 86 | Temp 98.2°F | Resp 20 | Ht 65.0 in | Wt 257.0 lb

## 2020-10-31 DIAGNOSIS — J84115 Respiratory bronchiolitis interstitial lung disease: Secondary | ICD-10-CM | POA: Diagnosis not present

## 2020-10-31 DIAGNOSIS — I739 Peripheral vascular disease, unspecified: Secondary | ICD-10-CM

## 2020-10-31 NOTE — Telephone Encounter (Signed)
No return call from the patient 

## 2020-10-31 NOTE — Progress Notes (Signed)
Patient is a 59 year old female who returns for follow-up today.  She previously underwent angioplasty of her mid left superficial femoral artery in 2018.  At that time she also has mild stenosis of her right superficial femoral artery.  She has intermittently noted to have some narrowing of her superficial femoral artery on surveillance duplex exam.  However, this had never been clinically significant and she has had normal ABIs.  About 1 week ago she began to develop pain in her right hip with radiation into her right inner thigh and right outer leg with intermittent numbness and tingling in her right foot.  She started to notice sudden onset of this after a physical event.  She states she has had continuous pain since then.  She was recently seen by her primary care physician and started on a steroid Dosepak.  She does not describe claudication in either calf.  She does continue to smoke intermittently.  She was counseled against this again today.  He is also having a left eye procedure done in a few weeks.  I told her she could stop her aspirin 10 days prior to this.  She should resume it as soon as possible afterwards.  Past Medical History:  Diagnosis Date  . Allergic rhinitis   . Arthritis   . Asthma   . Breast nodule    right breast, to see dr Helane Rima 06-20-2012 for   . Chest pain, non-cardiac   . Chronic headaches   . Cigarette smoker   . Complication of anesthesia 05-17-2012   trouble breathing after colonscopy, needed nebulizer  . COPD (chronic obstructive pulmonary disease) ([place])   . Diastolic congestive heart failure ([place]) 04/17/2019  . Diverticulosis   . GERD (gastroesophageal reflux disease)   . Hepatic hemangioma   . Hypertension   . IBS (irritable bowel syndrome)   . Nuclear stress test    Myoview 05/2019: EF 63, no ischemia. Low Risk  . Obesity   . Prosthetic eye globe   . Pyloric erosion   . Sleep apnea    CPAP setting varies from 4-10  . Type 2 diabetes mellitus ([place])  03/11/2017    Past Surgical History:  Procedure Laterality Date  . ABDOMINAL AORTOGRAM W/LOWER EXTREMITY N/A 04/30/2017   Procedure: Abdominal Aortogram w/Lower Extremity;  Surgeon: Elam Dutch, MD;  Location: [facility];  Service: Cardiovascular;  Laterality: N/A;  . CARDIAC CATHETERIZATION  12/28/2007   EF 75%. IT REVEALS NORMAL/SUPRANORMAL LEFT VENTRICULAR SYSTOLIC FUNCTION  . CARDIOVASCULAR STRESS TEST  03/25/2007   EF 78%  . CESAREAN SECTION  2003, 2009   x 2  . CHOLECYSTECTOMY  1993  . COLONOSCOPY  2014  . ENDOMETRIAL ABLATION  09/2010   and D&C  . HERNIA REPAIR  3790   umbilical  . LAPAROSCOPY    . PERIPHERAL VASCULAR BALLOON ANGIOPLASTY Left 04/30/2017   Procedure: Peripheral Vascular Balloon Angioplasty;  Surgeon: Elam Dutch, MD;  Location: [facility];  Service: Cardiovascular;  Laterality: Left;  SFA  . TUBAL LIGATION    . UMBILICAL HERNIA REPAIR  2004  . US ECHOCARDIOGRAPHY  05/08/2008   EF 55-60%  . VENTRAL HERNIA REPAIR  06/15/2012   Procedure: LAPAROSCOPIC VENTRAL HERNIA;  Surgeon: Edward Jolly, MD;  Location: [facility];  Service: General;  Laterality: N/A;  LAPAROSCOPIC REPAIR VENTRAL HERNIA  . VIDEO BRONCHOSCOPY Bilateral 11/15/2017   Procedure: VIDEO BRONCHOSCOPY WITH FLUORO;  Surgeon: Collene Gobble, MD;  Location: [facility];  Service: Cardiopulmonary;  Laterality: Bilateral;    Current Outpatient Medications on File Prior to Visit  Medication Sig Dispense Refill  . albuterol (VENTOLIN HFA) 108 (90 Base) MCG/ACT inhaler Inhale 2 puffs into the lungs every 6 (six) hours as needed for wheezing or shortness of breath. 8 g 6  . Alirocumab (PRALUENT) 75 MG/ML SOAJ Inject 1 pen into the skin every 14 (fourteen) days. 6 mL 3  . aspirin EC 81 MG tablet Take 81 mg by mouth daily.    . Bempedoic Acid-Ezetimibe (NEXLIZET) 180-10 MG TABS Take 1 tablet by mouth daily. 90 tablet 3  . buPROPion (WELLBUTRIN SR) 150 MG 12 hr tablet TAKE 1 TABLET BY  MOUTH TWICE A DAY 180 tablet 0  . carvedilol (COREG) 12.5 MG tablet TAKE 1 TABLET BY MOUTH TWICE A DAY 60 tablet 5  . dicyclomine (BENTYL) 10 MG capsule TAKE 1 CAPSULE (10 MG TOTAL) BY MOUTH 3 (THREE) TIMES DAILY BEFORE MEALS. AS NEEDED 270 capsule 3  . ONE TOUCH LANCETS MISC Use to check blood sugars twice a day 200 each 0  . OZEMPIC, 0.25 OR 0.5 MG/DOSE, 2 MG/1.5ML SOPN INJECT 0.5 MG INTO THE SKIN ONCE A WEEK. 3 pen 8  . pantoprazole (PROTONIX) 40 MG tablet TAKE 1 TABLET BY MOUTH EVERY DAY 90 tablet 1  . predniSONE (DELTASONE) 50 MG tablet Take 1 tablet (50 mg total) by mouth daily with breakfast. 5 tablet 0  . spironolactone (ALDACTONE) 25 MG tablet TAKE 1 TABLET BY MOUTH EVERY DAY 90 tablet 1  . telmisartan (MICARDIS) 80 MG tablet TAKE 1 TABLET (80 MG TOTAL) BY MOUTH DAILY. UNTIL CAN GET LOSARTAN 90 tablet 0  . nicotine (NICODERM CQ - DOSED IN MG/24 HOURS) 21 mg/24hr patch Place 1 patch (21 mg total) onto the skin daily. (Patient not taking: Reported on 10/31/2020) 28 patch 0  . nicotine polacrilex ([employer]S NICOTINE) 2 MG gum Take 1 each (2 mg total) by mouth as needed for smoking cessation (max 5 a day). (Patient not taking: Reported on 10/31/2020) 100 tablet 0  . Sodium Sulfate-Mag Sulfate-KCl (SUTAB) [phone] MG TABS Take 1 kit by mouth as directed. 24 tablet 0   No current facility-administered medications on file prior to visit.    Review of systems: She has no shortness of breath.  She has no chest pain.  Physical exam:  Vitals:   10/31/20 0905  BP: 115/70  Pulse: 86  Resp: 20  Temp: 98.2 F (36.8 C)  SpO2: 96%  Weight: 257 lb (116.6 kg)  Height: _0  (1.651 m)    Extremities: 2+ dorsalis pedis pulses bilaterally  Data: Patient had bilateral lower extremity duplex exam today which showed no significant SFA narrowing bilaterally.  She also had bilateral ABIs performed which were triphasic greater than 1 and normal bilaterally.  Assessment: Patient with patent superficial  femoral arteries bilaterally with no significant narrowing and normal ABIs.  New onset right hip and right thigh and leg pain most likely secondary to back issues other than peripheral arterial disease.  This is currently under the care of Dr. Yong Channel and she has follow-up scheduled.  Plan: Follow-up 1 year repeat ABIs and office visit with our APP.  Ruta Hinds, MD [facility] Office: [phone]

## 2020-12-03 ENCOUNTER — Encounter: Payer: Self-pay | Admitting: Family Medicine

## 2021-01-22 ENCOUNTER — Ambulatory Visit (INDEPENDENT_AMBULATORY_CARE_PROVIDER_SITE_OTHER): Payer: Managed Care, Other (non HMO) | Admitting: Family Medicine

## 2021-01-22 ENCOUNTER — Ambulatory Visit: Payer: Self-pay

## 2021-01-22 ENCOUNTER — Other Ambulatory Visit: Payer: Self-pay

## 2021-01-22 VITALS — BP 124/84 | HR 72 | Ht 65.0 in | Wt 262.2 lb

## 2021-01-22 DIAGNOSIS — M79645 Pain in left finger(s): Secondary | ICD-10-CM

## 2021-01-22 DIAGNOSIS — M65312 Trigger thumb, left thumb: Secondary | ICD-10-CM | POA: Diagnosis not present

## 2021-01-22 NOTE — Patient Instructions (Addendum)
Thank you for coming in today.  You received an injection today. Seek immediate medical attention if the joint becomes red, extremely painful, or is oozing fluid.  Use the double bandaid splint or STAX splint.   Recheck in 1 month or 6 weeks.

## 2021-01-22 NOTE — Progress Notes (Signed)
   I, [name], LAT, ATC acting as a scribe for Lisa Leader, MD.  Lisa [name] is a 60 y.o. female who presents to [place] [facility] today for L thumb pain. Pt was last seen by Dr. Tamala Julian on 06/15/19 for trigger finger of the 3rd digit. Today pt reports L thumb pain has been ongoing for 2.5 weeks w/ no known MOI. Pt is L-hand dominate. Pt locates pain to radial aspect of the proximal phalanx. Pt describes pain as "stinging" pain. Pt reports thumb will get stuck in the flexed position and she will have to manually "pop" it back into a normal position. Pt does a lot of typing for work.   Pertinent review of systems: No fevers or chills  Relevant historical information: Previous trigger finger right hand.  Hypertension and diabetes.   Exam:  BP 124/84 (BP Location: Right Arm, Patient Position: Sitting, Cuff Size: Normal)   Pulse 72   Ht 5\' 5"  (1.651 m)   Wt 262 lb 3.2 oz (118.9 kg)   SpO2 98%   BMI 43.63 kg/m  General: Well Developed, well nourished, and in no acute distress.   MSK: Left thumb normal-appearing Tender palpation palmar MCP. Triggering present with flexion of IP joint.    Lab and Radiology Results  Procedure: Real-time Ultrasound Guided Injection of left thumb A1 pulley tendon sheath Device: Philips Affiniti 50G Images permanently stored and available for review in PACS Verbal informed consent obtained.  Discussed risks and benefits of procedure. Warned about infection bleeding damage to structures skin hypopigmentation and fat atrophy among others. Patient expresses understanding and agreement Time-out conducted.   Noted no overlying erythema, induration, or other signs of local infection.   Skin prepped in a sterile fashion.   Local anesthesia: Topical Ethyl chloride.   With sterile technique and under real time ultrasound guidance:  40 mg of Kenalog and 1 mL of lidocaine injected into tendon sheath and left thumb A1 pulley. Fluid  seen entering the tendon sheath.   Completed without difficulty   Pain immediately resolved suggesting accurate placement of the medication.   Advised to call if fevers/chills, erythema, induration, drainage, or persistent bleeding.   Images permanently stored and available for review in the ultrasound unit.  Impression: Technically successful ultrasound guided injection.         Assessment and Plan: 60 y.o. female with left trigger thumb.  Plan for injection as above.  Plan also for double Band-Aid splint with Stax splint as needed.  Recheck back in about 4 to 6 weeks.   PDMP not reviewed this encounter. Orders Placed This Encounter  Procedures  . [name] LIMITED JOINT SPACE STRUCTURES UP LEFT(NO LINKED CHARGES)    Standing Status:   Future    Number of Occurrences:   1    Standing Expiration Date:   07/22/2021    Order Specific Question:   Reason for Exam (SYMPTOM  OR DIAGNOSIS REQUIRED)    Answer:   Left thumb pain    Order Specific Question:   Preferred imaging location?    Answer:   [place]   No orders of the defined types were placed in this encounter.    Discussed warning signs or symptoms. Please see discharge instructions. Patient expresses understanding.   The above documentation has been reviewed and is accurate and complete Lisa [name], M.D.

## 2021-02-13 ENCOUNTER — Telehealth: Payer: Self-pay

## 2021-02-13 ENCOUNTER — Telehealth: Payer: Self-pay | Admitting: Cardiovascular Disease

## 2021-02-13 NOTE — Telephone Encounter (Signed)
Called patient d/t c/o CP.  She expressed that CP is in the center of her chest and has occurred off and on since yesterday.   No associated N/V or diaphoresis.   Reports that CP occurs even while she is at rest.   Patient noted grunting in pain while on the phone with RN.  I advised her to call 911 and go to the ED for evaluation.  She is worried about the cost of ambulance.  I told her that with her symptoms she really needs to get evaluated now.  She expressed that she would find a way to the ED.  I again advised her to call 911.  She agreed to plan.  Will route to Dr. Cathie Olden and his RN.

## 2021-02-13 NOTE — Telephone Encounter (Signed)
Noted  

## 2021-02-13 NOTE — Telephone Encounter (Signed)
Dr. Yong Channel is aware that she has this appointment .

## 2021-02-13 NOTE — Telephone Encounter (Signed)
Patient called in stating she is going to see her cardiologist tomorrow, and wanted to update [name].

## 2021-02-13 NOTE — Telephone Encounter (Signed)
See phone note

## 2021-02-13 NOTE — Telephone Encounter (Signed)
Cardiology is also suggested in the ER "Called patient d/t c/o CP.  She expressed that CP is in the center of her chest and has occurred off and on since yesterday.   No associated N/V or diaphoresis.   Reports that CP occurs even while she is at rest.   Patient noted grunting in pain while on the phone with RN.  I advised her to call 911 and go to the ED for evaluation.  She is worried about the cost of ambulance.  I told her that with her symptoms she really needs to get evaluated now.  She expressed that she would find a way to the ED.  I again advised her to call 911.  She agreed to plan.  Will route to Dr. Cathie Olden and his RN. "  PVCs do not explain the chest pain and I would still recommend ER.

## 2021-02-13 NOTE — Telephone Encounter (Signed)
See below

## 2021-02-13 NOTE — Telephone Encounter (Signed)
Called patient back to see if she went to the ED.  She told me that she dialed 911 and paramedics checked her out.  Per pt statement they feel that she is having PVC's.  I offered her an earlier appointment, she is scheduled to see Dr. Acie Fredrickson at 1340 on 02/14/21.

## 2021-02-13 NOTE — Telephone Encounter (Signed)
I agree with note and plan by Roderick Pee , RN

## 2021-02-13 NOTE — Telephone Encounter (Signed)
Had an appt come open for Monday with Dr. Yong Channel. Pt is now scheduled for Monday at 2:20

## 2021-02-13 NOTE — Telephone Encounter (Signed)
Thanks

## 2021-02-13 NOTE — Telephone Encounter (Signed)
Nurse Assessment Nurse: Jimmye Norman, RN, Whitney Date/Time (Eastern Time): 02/13/2021 8:14:33 AM Confirm and document reason for call. If symptomatic, describe symptoms. ---Caller states she is experiencing some chest pain flutter like feeling and dizziness. States it started yesterday at work, she thought it was indigestion, but it's not going away completely. States it was gone when she got up this morning, but started coming back. Does the patient have any new or worsening symptoms? ---Yes Will a triage be completed? ---Yes Related visit to physician within the last 2 weeks? ---No Does the PT have any chronic conditions? (i.e. diabetes, asthma, this includes High risk factors for pregnancy, etc.) ---Yes List chronic conditions. ---hypertension, diabetes Is this a behavioral health or substance abuse call? ---No Guidelines Guideline Title Affirmed Question Affirmed Notes Nurse Date/Time Eilene Ghazi Time) Chest Pain Dizziness or lightheadedness Jimmye Norman, RN, Loree Fee 02/13/2021 8:17:16 AM Disp. Time Eilene Ghazi Time) Disposition Final User 02/13/2021 8:10:40 AM Send to Urgent Tobie Lords 02/13/2021 8:23:43 AM Go to ED Now Yes Jimmye Norman, RN, Whitney PLEASE NOTE: All timestamps contained within this report are represented as [place] Standard Time. CONFIDENTIALTY NOTICE: This fax transmission is intended only for the addressee. It contains information that is legally privileged, confidential or otherwise protected from use or disclosure. If you are not the intended recipient, you are strictly prohibited from reviewing, disclosing, copying using or disseminating any of this information or taking any action in reliance on or regarding this information. If you have received this fax in error, please notify us immediately by telephone so that we can arrange for its return to [name]. Phone: [phone], Toll-Free: [phone], Fax: [phone] Page: 2 of 2 Call Id: [id number] Jackson Disagree/Comply  Disagree Caller Understands Yes PreDisposition InappropriateToAsk Care Advice Given Per Guideline GO TO ED NOW: * You need to be seen in the Emergency Department. NOTE TO TRIAGER - DRIVING: * If immediate transportation is not available via car, rideshare (e.g., Lyft, Uber), or taxi, then the patient should be instructed to call [id number]. Comments User: Myriam Forehand, RN Date/Time (Eastern Time): 02/13/2021 8:24:45 AM Caller refusing ED, states she would really like to be seen in the office, she does not feel this is heart related. RN tried to urge caller to go to the ED now, but she is refusing. User: Myriam Forehand, RN Date/Time Eilene Ghazi Time): 02/13/2021 8:27:20 AM RN spoke with Janett Billow through the back line and informed of triage outcome for caller. Instructed RN to send the report to the office and they will review and call the caller back. Referrals GO TO FACILITY REFUSE

## 2021-02-13 NOTE — Telephone Encounter (Signed)
Waiting on triage notes in order to enter them..   Pt was sent to triage and had an ED outcome. Pt refused. I spoke to Dr. Yong Channel to see what he would advise. Dr. Yong Channel mentioned he was already overbooked today. He believes with the pt.'s other risk factors she does need to get seen today. Dr. Yong Channel advised go to ED or urgent care and that she needs her troponin level checked. Dr. Yong Channel would normally be able to work pt in for such situation, but he is already overbooked. Called pt and relayed to her what Dr. Yong Channel said and that he would really like her to get checked out. Pt said she would go to an urgent care. Told pt to call us back if she needs anything.

## 2021-02-13 NOTE — Telephone Encounter (Signed)
    Pt c/o of Chest Pain: STAT if CP now or developed within 24 hours  1. Are you having CP right now? No  2. Are you experiencing any other symptoms (ex. SOB, nausea, vomiting, sweating)? Feels woozy but not dizzy  3. How long have you been experiencing CP? Started yesterday   4. Is your CP continuous or coming and going? Coming and going   5. Have you taken Nitroglycerin? No  Pt said yesterday while at work, she felt an uncomfortable twitch on her middle of her chest, right on her sternum. She said it comes and goes every few seconds. When she feels the twitch sometimes she will feel woozy that will last for half a second. She tried to take tums but it did not help. She wanted to check in with Dr. Acie Fredrickson and see if he can see her today or tomorrow  ?

## 2021-02-13 NOTE — Telephone Encounter (Signed)
Pt ended up calling 911. They told pt she was throwing PVC's. They reassured pt that nothing else life threatening was wrong . They told her she could go to the hospital if she wanted, but that her Dr. Could do the same blood work that was necessary. The paramedic told pt that they believe the PVC's were stress induced, because they would increase when pt would start talking and decrease as pt was calm and not talking. Pt states her cardiologist cannot see her til the 29th. Pt wants to know if she should wait til then, or come in sooner and see Dr. Yong Channel

## 2021-02-14 ENCOUNTER — Other Ambulatory Visit: Payer: Self-pay

## 2021-02-14 ENCOUNTER — Ambulatory Visit (INDEPENDENT_AMBULATORY_CARE_PROVIDER_SITE_OTHER): Payer: Managed Care, Other (non HMO)

## 2021-02-14 ENCOUNTER — Encounter: Payer: Self-pay | Admitting: Cardiovascular Disease

## 2021-02-14 ENCOUNTER — Encounter: Payer: Self-pay | Admitting: Radiology

## 2021-02-14 ENCOUNTER — Ambulatory Visit (INDEPENDENT_AMBULATORY_CARE_PROVIDER_SITE_OTHER): Payer: Managed Care, Other (non HMO) | Admitting: Cardiovascular Disease

## 2021-02-14 VITALS — BP 102/60 | HR 82 | Ht 65.5 in | Wt 260.4 lb

## 2021-02-14 DIAGNOSIS — R002 Palpitations: Secondary | ICD-10-CM | POA: Diagnosis not present

## 2021-02-14 DIAGNOSIS — R079 Chest pain, unspecified: Secondary | ICD-10-CM | POA: Diagnosis not present

## 2021-02-14 DIAGNOSIS — I5030 Unspecified diastolic (congestive) heart failure: Secondary | ICD-10-CM

## 2021-02-14 MED ORDER — PROPRANOLOL HCL 10 MG PO TABS: ORAL_TABLET | ORAL | 6 refills | Status: DC

## 2021-02-14 MED ORDER — TELMISARTAN 40 MG PO TABS: 40.0000 mg | ORAL_TABLET | Freq: Every day | ORAL | 3 refills | Status: DC

## 2021-02-14 MED ORDER — CARVEDILOL 25 MG PO TABS
25.0000 mg | ORAL_TABLET | Freq: Two times a day (BID) | ORAL | 3 refills | Status: DC
Start: 2021-02-14 — End: 2021-04-02

## 2021-02-14 NOTE — Progress Notes (Signed)
Enrolled patient for a 14 day Zio XT Monitor to be mailed to patients home  

## 2021-02-14 NOTE — Patient Instructions (Signed)
Medication Instructions:  Your physician has recommended you make the following change in your medication:   INCREASE Coreg to 25mg  twice a day DECREASE Micardis 40mg  daily Start Propranolol 10mg  four times a day as needed  *If you need a refill on your cardiac medications before your next appointment, please call your pharmacy*   Lab Work: Today: BMP, TSH, CBC If you have labs (blood work) drawn today and your tests are completely normal, you will receive your results only by: Marland Kitchen MyChart Message (if you have MyChart) OR . A paper copy in the mail If you have any lab test that is abnormal or we need to change your treatment, we will call you to review the results.   Testing/Procedures: Your physician has requested that you have an echocardiogram. Echocardiography is a painless test that uses sound waves to create images of your heart. It provides your doctor with information about the size and shape of your heart and how well your heart's chambers and valves are working. This procedure takes approximately one hour. There are no restrictions for this procedure.  Your physician has recommended that you wear an event monitor. Event monitors are medical devices that record the heart's electrical activity. Doctors most often [name] these monitors to diagnose arrhythmias. Arrhythmias are problems with the speed or rhythm of the heartbeat. The monitor is a small, portable device. You can wear one while you do your normal daily activities. This is usually used to diagnose what is causing palpitations/syncope (passing out).   Follow-Up: At [facility], you and your health needs are our priority.  As part of our continuing mission to provide you with exceptional heart care, we have created designated Provider Care Teams.  These Care Teams include your primary Cardiologist (physician) and Advanced Practice Providers (APPs -  Physician Assistants and Nurse Practitioners) who all work together to provide  you with the care you need, when you need it.   Your next appointment:   3 month(s)  The format for your next appointment:   In Person  Provider:   You will see one of the following Advanced Practice Providers on your designated Care Team:    Richardson Dopp, PA-C  Vin Bhagat, [place]   Other Instructions [place] Monitor Instructions   Your physician has requested you wear your ZIO patch monitor_14 days.   This is a single patch monitor.  [employer] supplies one patch monitor per enrollment.  Additional stickers are not available.   Please do not apply patch if you will be having a Nuclear Stress Test, Echocardiogram, Cardiac CT, MRI, or Chest Xray during the time frame you would be wearing the monitor. The patch cannot be worn during these tests.  You cannot remove and re-apply the ZIO XT patch monitor.   Your ZIO patch monitor will be sent USPS Priority mail from [facility] directly to your home address. The monitor may also be mailed to a PO BOX if home delivery is not available.   It may take 3-5 days to receive your monitor after you have been enrolled.   Once you have received you monitor, please review enclosed instructions.  Your monitor has already been registered assigning a specific monitor serial # to you.   Applying the monitor   Shave hair from upper left chest.   Hold abrader disc by orange tab.  Rub abrader in 40 strokes over left upper chest as indicated in your monitor instructions.   Clean area with 4  enclosed alcohol pads .  Use all pads to assure are is cleaned thoroughly.  Let dry.   Apply patch as indicated in monitor instructions.  Patch will be place under collarbone on left side of chest with arrow pointing upward.   Rub patch adhesive wings for 2 minutes.Remove white label marked "1".  Remove white label marked "2".  Rub patch adhesive wings for 2 additional minutes.   While looking in a mirror, press and release button in center of  patch.  A small green light will flash 3-4 times .  This will be your only indicator the monitor has been turned on.     Do not shower for the first 24 hours.  You may shower after the first 24 hours.   Press button if you feel a symptom. You will hear a small click.  Record Date, Time and Symptom in the Patient Log Book.   When you are ready to remove patch, follow instructions on last 2 pages of Patient Log Book.  Stick patch monitor onto last page of Patient Log Book.   Place Patient Log Book in [place] box.  Use locking tab on box and tape box closed securely.  The [employer] has [employer] on it.  Please place in mailbox as soon as possible.  Your physician should have your test results approximately 7 days after the monitor has been mailed back to [facility].   Call Circle at [phone] if you have questions regarding your ZIO XT patch monitor.  Call them immediately if you see an orange light blinking on your monitor.   If your monitor falls off in less than 4 days contact our Monitor department at [phone].  If your monitor becomes loose or falls off after 4 days call [employer] at [phone] for suggestions on securing your monitor.

## 2021-02-14 NOTE — Progress Notes (Signed)
[name] Date of Birth  17-Jul-1961 [facility] [address]    [place], [place]  [address] [phone]  Fax  [phone]   Problem List:  1. Chest pain - normal cath in the past 2. Cigarette smoking. 3. COPD 4. Leg edema   Previous notes:  Lashawnda Is a 60 year old female with a history of hypertension. He's had some occasional episodes of chest discomfort.  These episodes of chest pain occur at times and are not necessarily associated with eating, drinking, change of position, or exercise.  She is walking on the treadmill 3 times a week.  She does not have chest pain while she is walking.  Feb. 26, 2014:  Nakema is doing well.  She has gained weight since I last saw her.  She has not been walking as much.  March 19, 2014:   She was seen by [name] last month for some chest pain. She had a Myoview study which revealed an intermediate risk. She continues to smoke. She is very upset and emotional at her last office visit.  She was started on Imdur but was  having lots of side effects.   She is still trying to stop smoking.   She has attended the smoking cessation classes at [facility].    She has started walking with her son several days ago.  The exercise is going OK.    She does have some occasional click chest pains. These only last for several seconds.  She has had progressive bilateral edema    Oct. 28, 2015:  Talitha feels ok.  No further episodes of CP Having numbness in her right thigh.   I suggested that this was a back or neuro issue. She has intermittant leg edema.  August 19th, 2016  Saskia called today complaining of some leg edema. She's had  leg edema on an intermittent basis. BP has been well controlled. Has tried to exercise but has lots of back pain and leg pain and leg weakness.  The leg swelling has worsened over the past several weeks.  On lasix   Weight has increased significantly over the past year    Nov. 28, 2016:  Salihah is  doing ok Watching what she eats  Has lost 14 lbs over 3 months    Wt Readings from Last 3 Encounters:  10/28/15 276 lb 1.9 oz (125.247 kg)  08/12/15 277 lb (125.646 kg)  07/19/15 290 lb (131.543 kg)   BP is well controlled.   Oct.  24, 2017:  Aliese ws seen by urgent care last week . BP was 202 / 100  Also has chest pain . CP every day  We have done at least 2 caths  Has stopped smoking   Not eating salty food.   February 26, 2017:  Sruthi is seen today for follow up of an episode of CP last week.  Feeling better.  Still smoking  - less than a PPD . Seeing Dr. Lamonte Sakai  Has been diagnosed with PAD  - knows that this is due to smoking.     February 14, 2021: Lawrie  is seen today for a work in visit.  She has been having episodes of chest pain. Has mid sternal pain .   Twitch like sensation , lightheaded,  Last for a split second.   But then recurs several minutes later .  This   Wt is 260 lbs  Walks at work,  No exercise  Still smoking  Current Outpatient Medications on File Prior to Visit  Medication Sig Dispense Refill  . albuterol (VENTOLIN HFA) 108 (90 Base) MCG/ACT inhaler Inhale 2 puffs into the lungs every 6 (six) hours as needed for wheezing or shortness of breath. 8 g 6  . Alirocumab (PRALUENT) 75 MG/ML SOAJ Inject 1 pen into the skin every 14 (fourteen) days. 6 mL 3  . aspirin EC 81 MG tablet Take 81 mg by mouth daily.    . Bempedoic Acid-Ezetimibe (NEXLIZET) 180-10 MG TABS Take 1 tablet by mouth daily. 90 tablet 3  . buPROPion (WELLBUTRIN SR) 150 MG 12 hr tablet Take 150 mg by mouth daily.    . carvedilol (COREG) 12.5 MG tablet Take 12.5 mg by mouth daily.    Marland Kitchen dicyclomine (BENTYL) 10 MG capsule TAKE 1 CAPSULE (10 MG TOTAL) BY MOUTH 3 (THREE) TIMES DAILY BEFORE MEALS. AS NEEDED 270 capsule 3  . nicotine (NICODERM CQ - DOSED IN MG/24 HOURS) 21 mg/24hr patch Place 1 patch (21 mg total) onto the skin daily. 28 patch 0  . nicotine polacrilex ([employer] NICOTINE) 2 MG gum  Take 1 each (2 mg total) by mouth as needed for smoking cessation (max 5 a day). 100 tablet 0  . ONE TOUCH LANCETS MISC Use to check blood sugars twice a day 200 each 0  . OZEMPIC, 0.25 OR 0.5 MG/DOSE, 2 MG/1.5ML SOPN INJECT 0.5 MG INTO THE SKIN ONCE A WEEK. 3 pen 8  . pantoprazole (PROTONIX) 40 MG tablet TAKE 1 TABLET BY MOUTH EVERY DAY 90 tablet 1  . spironolactone (ALDACTONE) 25 MG tablet TAKE 1 TABLET BY MOUTH EVERY DAY 90 tablet 1  . telmisartan (MICARDIS) 80 MG tablet TAKE 1 TABLET (80 MG TOTAL) BY MOUTH DAILY. UNTIL CAN GET LOSARTAN 90 tablet 0   No current facility-administered medications on file prior to visit.    Allergies  Allergen Reactions  . Almond Oil Anaphylaxis and Nausea And Vomiting    Almond-Nuts Only  . Apple Anaphylaxis  . Ceftin [Cefuroxime] Other (See Comments)    Severe stomach pain - Diverticulitis flares up  . Cherry Extract Anaphylaxis    Cherry fruit only   . Other Anaphylaxis and Itching    Most fruits- Are NOT tolerated (PLEASE ASK PATIENT BEFORE GIVING, AS CERTAIN FRUITS ARE TOLERATED IN LIMITED QUANTITIES!!)  . Guaifenesin & Derivatives Other (See Comments)    Restlessness, jittery  . Hctz [Hydrochlorothiazide] Other (See Comments)    Causes a headache  . Naproxen Other (See Comments)    Insomnia  . Lisinopril Cough         Past Medical History:  Diagnosis Date  . Allergic rhinitis   . Arthritis   . Asthma   . Breast nodule    right breast, to see dr Helane Rima 06-20-2012 for   . Chest pain, non-cardiac   . Chronic headaches   . Cigarette smoker   . Complication of anesthesia 05-17-2012   trouble breathing after colonscopy, needed nebulizer  . COPD (chronic obstructive pulmonary disease) ([place])   . Diastolic congestive heart failure ([place]) 04/17/2019  . Diverticulosis   . GERD (gastroesophageal reflux disease)   . Hepatic hemangioma   . Hypertension   . IBS (irritable bowel syndrome)   . Nuclear stress test    Myoview 05/2019: EF 63, no  ischemia. Low Risk  . Obesity   . Prosthetic eye globe   . Pyloric erosion   . Sleep apnea    CPAP setting varies from 4-10  .  Type 2 diabetes mellitus ([place]) 03/11/2017    Past Surgical History:  Procedure Laterality Date  . ABDOMINAL AORTOGRAM W/LOWER EXTREMITY N/A 04/30/2017   Procedure: Abdominal Aortogram w/Lower Extremity;  Surgeon: Elam Dutch, MD;  Location: [facility];  Service: Cardiovascular;  Laterality: N/A;  . CARDIAC CATHETERIZATION  12/28/2007   EF 75%. IT REVEALS NORMAL/SUPRANORMAL LEFT VENTRICULAR SYSTOLIC FUNCTION  . CARDIOVASCULAR STRESS TEST  03/25/2007   EF 78%  . CESAREAN SECTION  2003, 2009   x 2  . CHOLECYSTECTOMY  1993  . COLONOSCOPY  2014  . ENDOMETRIAL ABLATION  09/2010   and D&C  . HERNIA REPAIR  6301   umbilical  . LAPAROSCOPY    . PERIPHERAL VASCULAR BALLOON ANGIOPLASTY Left 04/30/2017   Procedure: Peripheral Vascular Balloon Angioplasty;  Surgeon: Elam Dutch, MD;  Location: [facility];  Service: Cardiovascular;  Laterality: Left;  SFA  . TUBAL LIGATION    . UMBILICAL HERNIA REPAIR  2004  . US ECHOCARDIOGRAPHY  05/08/2008   EF 55-60%  . VENTRAL HERNIA REPAIR  06/15/2012   Procedure: LAPAROSCOPIC VENTRAL HERNIA;  Surgeon: Edward Jolly, MD;  Location: [facility];  Service: General;  Laterality: N/A;  LAPAROSCOPIC REPAIR VENTRAL HERNIA  . VIDEO BRONCHOSCOPY Bilateral 11/15/2017   Procedure: VIDEO BRONCHOSCOPY WITH FLUORO;  Surgeon: Collene Gobble, MD;  Location: [facility];  Service: Cardiopulmonary;  Laterality: Bilateral;    Social History   Tobacco Use  Smoking Status Current Every Day Smoker  . Packs/day: 1.50  . Years: 38.00  . Pack years: 57.00  . Types: Cigarettes  Smokeless Tobacco Never Used  Tobacco Comment   10 cigarettes daily ARJ 09/10/2020    Social History   Substance and Sexual Activity  Alcohol Use No  . Alcohol/week: 0.0 standard drinks    Family History  Problem Relation Age of Onset  .  Hypertension Mother   . Lung cancer Mother        81  . Diabetes Maternal Grandmother   . Heart disease Father        pacemaker or defibrillator  . Heart attack Paternal Uncle   . Breast cancer Other   . Colon cancer Neg Hx   . Esophageal cancer Neg Hx   . Pancreatic cancer Neg Hx     Reviw of Systems:  Reviewed in the HPI.  All other systems are negative.  Physical Exam: Blood pressure 102/60, pulse 82, height 5' 5.5" (1.664 m), weight 260 lb 6.4 oz (118.1 kg), SpO2 97 %.  GEN:  Well nourished, well developed in no acute distress HEENT: Normal NECK: No JVD; No carotid bruits LYMPHATICS: No lymphadenopathy CARDIAC: RRR,  Occasional PVC by palpation  RESPIRATORY:  Clear to auscultation without rales, wheezing or rhonchi  ABDOMEN: Soft, non-tender, non-distended MUSCULOSKELETAL:  No edema; No deformity  SKIN: Warm and dry NEUROLOGIC:  Alert and oriented x 3  ECG: February 14, 2021: Normal sinus rhythm at 84.  No ST or T wave changes.   Assessment / Plan:   1. Chest pain - normal cath in the past.  She has chest wall pain. I do not think that her chest pains are cardiac. I suspect these are actually PVCs. The discomfort is a very quick pain , center of her chest      2. Cigarette smoking.     7 minutes spent on cessation ,  Needs to stop smoking    3. COPD -  4. PAD :  Managed by [facility]   5. Essential HTN:    6.  Palpitations :   Has palpitations that I think are PVCs.   Will increase her coreg to 25 mg BID,  Reduce micarditis to 40 mg a day  Will also add propranolol 10 mg QID PRN.   14 day monitor  BMP TSH CBC     I have also encouraged her to lose weight and to exercise   Mertie Moores, MD  02/14/2021 2:20 PM    [address],  [place], [place]  [id number] Pager [phone] Phone: [phone]; Fax: [phone]

## 2021-02-15 LAB — BASIC METABOLIC PANEL
BUN/Creatinine Ratio: 21 (ref 9–23)
BUN: 30 mg/dL — ABNORMAL HIGH (ref 6–24)
CO2: 22 mmol/L (ref 20–29)
Calcium: 10.5 mg/dL — ABNORMAL HIGH (ref 8.7–10.2)
Chloride: 106 mmol/L (ref 96–106)
Creatinine, Ser: 1.42 mg/dL — ABNORMAL HIGH (ref 0.57–1.00)
Glucose: 95 mg/dL (ref 65–99)
Potassium: 4.4 mmol/L (ref 3.5–5.2)
Sodium: 144 mmol/L (ref 134–144)
eGFR: 43 mL/min/{1.73_m2} — ABNORMAL LOW (ref >59)

## 2021-02-15 LAB — CBC
Hematocrit: 45.4 % (ref 34.0–46.6)
Hemoglobin: 15.1 g/dL (ref 11.1–15.9)
MCH: 30.6 pg (ref 26.6–33.0)
MCHC: 33.3 g/dL (ref 31.5–35.7)
MCV: 92 fL (ref 79–97)
Platelets: 317 10*3/uL (ref 150–450)
RBC: 4.94 x10E6/uL (ref 3.77–5.28)
RDW: 12.4 % (ref 11.7–15.4)
WBC: 7.9 10*3/uL (ref 3.4–10.8)

## 2021-02-15 LAB — TSH: TSH: 1.19 u[IU]/mL (ref 0.450–4.500)

## 2021-02-17 ENCOUNTER — Ambulatory Visit: Payer: Managed Care, Other (non HMO) | Admitting: Family Medicine

## 2021-02-19 DIAGNOSIS — R002 Palpitations: Secondary | ICD-10-CM

## 2021-02-24 NOTE — Progress Notes (Signed)
I, Lisa [name], LAT, ATC, am serving as scribe for Dr. [name].  Lisa [name] is a 60 y.o. female who presents to [place] at [facility] today for f/u L trigger thumb. Pt was last seen by Dr. [name] on 01/22/21 and was given a steroid injection into tendon sheath and left thumb A1 pulley and advised to use band-aid splint w/ Stax splint as needed. Today, pt reports that her L thumb is doing better.  She reports no current pain in her L thumb and notes con't popping in her L thumb.  She reports B knee pain, L>R, x few days and locates her pain to her B ant knees.  She reports some mild L medial knee swelling.   Pertinent review of systems: No fevers or chills  Relevant historical information: Heart failure, COPD, hypertension and diabetes   Exam:  BP 102/68 (BP Location: Right Arm, Patient Position: Sitting, Cuff Size: Large)   Pulse 83   Ht 5' 5.5" (1.664 m)   Wt 262 lb 3.2 oz (118.9 kg)   SpO2 97%   BMI 42.97 kg/m  General: Well Developed, well nourished, and in no acute distress.   MSK: Left knee mild effusion otherwise normal-appearing Normal motion with crepitation. Tender palpation medial joint line. Intact strength. Stable ligamentous exam.   Right knee normal-appearing tiny joint effusion. Normal motion with crepitation. Tender palpation medial joint line. Intact strength. Stable ligamentous exam.  Left thumb normal-appearing nontender.  Normal motion normal strength no triggering present.    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  Small osteophyte superior patellar pole  Left knee: Mild DJD no fractures.  Mild osteophyte superior patellar pole  Await formal radiology review  Procedure: Real-time Ultrasound Guided Injection of left knee superior lateral patellar space Device: Philips Affiniti 50G Images permanently stored  and available for review in PACS Verbal informed consent obtained.  Discussed risks and benefits of procedure. Warned about infection bleeding damage to structures skin hypopigmentation and fat atrophy among others. Patient expresses understanding and agreement Time-out conducted.   Noted no overlying erythema, induration, or other signs of local infection.   Skin prepped in a sterile fashion.   Local anesthesia: Topical Ethyl chloride.   With sterile technique and under real time ultrasound guidance:  40 mg of Kenalog and 2 mL of Marcaine injected into left knee joint. Fluid seen entering the joint capsule.   Completed without difficulty   Pain immediately resolved suggesting accurate placement of the medication.   Advised to call if fevers/chills, erythema, induration, drainage, or persistent bleeding.   Images permanently stored and available for review in the ultrasound unit.  Impression: Technically successful ultrasound guided injection.         Assessment and Plan: 60 y.o. female with bilateral knee pain left worse than right.  Pain thought to be due to DJD.  Plan for steroid injection left knee.  Additionally use Voltaren gel and quad strengthening exercises.  Consider right knee injection as soon as 1 week from now however patient would like to wait for a little while longer if possible. Gout is a possible because of knee pain.  Plan for uric acid assessment.  Trigger thumb much improved with steroid injection.  Watchful waiting.  Avoid triggering if possible using double Band-Aid splint.   PDMP not reviewed this encounter. Orders Placed This Encounter  Procedures  . [name]  LIMITED JOINT SPACE STRUCTURES LOW BILAT(NO LINKED CHARGES)    Order Specific Question:   Reason for Exam (SYMPTOM  OR DIAGNOSIS REQUIRED)    Answer:   B knee pain    Order Specific Question:   Preferred imaging location?    Answer:   [facility]  . DG Knee AP/LAT W/[place] Right     Standing Status:   Future    Number of Occurrences:   1    Standing Expiration Date:   03/28/2021    Order Specific Question:   Reason for Exam (SYMPTOM  OR DIAGNOSIS REQUIRED)    Answer:   B knee pain    Order Specific Question:   Is patient pregnant?    Answer:   No    Order Specific Question:   Preferred imaging location?    Answer:   [facility]  . DG Knee AP/LAT W/Sunrise Left    Standing Status:   Future    Number of Occurrences:   1    Standing Expiration Date:   03/28/2021    Order Specific Question:   Reason for Exam (SYMPTOM  OR DIAGNOSIS REQUIRED)    Answer:   B knee pain    Order Specific Question:   Is patient pregnant?    Answer:   No    Order Specific Question:   Preferred imaging location?    Answer:   [facility]  . Uric acid    Standing Status:   Future    Number of Occurrences:   1    Standing Expiration Date:   02/25/2022   No orders of the defined types were placed in this encounter.    Discussed warning signs or symptoms. Please see discharge instructions. Patient expresses understanding.   The above documentation has been reviewed and is accurate and complete [name], M.D.

## 2021-02-25 ENCOUNTER — Ambulatory Visit (INDEPENDENT_AMBULATORY_CARE_PROVIDER_SITE_OTHER): Payer: Managed Care, Other (non HMO) | Admitting: Family Medicine

## 2021-02-25 ENCOUNTER — Encounter: Payer: Self-pay | Admitting: Family Medicine

## 2021-02-25 ENCOUNTER — Ambulatory Visit: Payer: Self-pay

## 2021-02-25 ENCOUNTER — Other Ambulatory Visit: Payer: Self-pay

## 2021-02-25 ENCOUNTER — Ambulatory Visit (INDEPENDENT_AMBULATORY_CARE_PROVIDER_SITE_OTHER): Payer: Managed Care, Other (non HMO)

## 2021-02-25 VITALS — BP 102/68 | HR 83 | Ht 65.5 in | Wt 262.2 lb

## 2021-02-25 DIAGNOSIS — M25561 Pain in right knee: Secondary | ICD-10-CM

## 2021-02-25 DIAGNOSIS — M25562 Pain in left knee: Secondary | ICD-10-CM | POA: Diagnosis not present

## 2021-02-25 DIAGNOSIS — M65312 Trigger thumb, left thumb: Secondary | ICD-10-CM

## 2021-02-25 LAB — URIC ACID: Uric Acid, Serum: 8.8 mg/dL — ABNORMAL HIGH (ref 2.4–7.0)

## 2021-02-25 NOTE — Patient Instructions (Addendum)
Good to see you.  Please use voltaren gel up to 4x daily for pain as needed.   Get knee x-rays and labs today before you leave.  Follow-up in one month.

## 2021-02-26 ENCOUNTER — Telehealth: Payer: Self-pay | Admitting: Family Medicine

## 2021-02-26 MED ORDER — ALLOPURINOL 300 MG PO TABS: 300.0000 mg | ORAL_TABLET | Freq: Every day | ORAL | 3 refills | Status: DC

## 2021-02-26 NOTE — Progress Notes (Signed)
X-ray right knee shows again a little spur at the kneecap but no severe arthritis.

## 2021-02-26 NOTE — Progress Notes (Signed)
Uric acid is elevated at 8.8.  This is high enough that I think gout may be causing some of your pain.  Plan to start allopurinol which will lower uric acid.  This will not treat pain immediately but will help to lower uric acid which will eventually treat gout.  Take it daily.

## 2021-02-26 NOTE — Telephone Encounter (Signed)
Allopurinol prescribed

## 2021-02-26 NOTE — Progress Notes (Signed)
X-ray left knee shows a little spur at the top of the kneecap where the quadriceps tendon attaches.  It does not show severe arthritis.

## 2021-03-08 ENCOUNTER — Other Ambulatory Visit: Payer: Self-pay | Admitting: Cardiovascular Disease

## 2021-03-10 ENCOUNTER — Ambulatory Visit: Payer: Managed Care, Other (non HMO) | Admitting: Cardiovascular Disease

## 2021-03-13 ENCOUNTER — Ambulatory Visit (HOSPITAL_COMMUNITY): Payer: Managed Care, Other (non HMO) | Attending: Cardiology

## 2021-03-13 ENCOUNTER — Other Ambulatory Visit: Payer: Self-pay

## 2021-03-13 DIAGNOSIS — R079 Chest pain, unspecified: Secondary | ICD-10-CM

## 2021-03-13 LAB — ECHOCARDIOGRAM COMPLETE
Area-P 1/2: 4.04 cm2
S' Lateral: 2.3 cm

## 2021-03-13 NOTE — Progress Notes (Signed)
Patient ID: Lisa [name], female   DOB: [birth year], 60 y.o.   MRN: [id number]  Patient did not consent to the use of Definity.

## 2021-03-20 NOTE — Patient Instructions (Addendum)
Health Maintenance Due  Topic Date Due  . FOOT EXAM - will plan on this next visit 10/10/2020   Please stop by lab before you go If you have mychart- we will send your results within 3 business days of [name] receiving them.  If you do not have mychart- we will call you about results within 5 business days of [name] receiving them.  *please also note that you will see labs on mychart as soon as they post. I will later go in and write notes on them- will say "notes from Dr. Yong Channel"  No changes today unless labs lead [name] to make changes  6 month physical or sooner if you need [name]

## 2021-03-20 NOTE — Progress Notes (Signed)
Phone [phone] In person visit   Subjective:   Lisa [name] is a 60 y.o. year old very pleasant female patient who presents for/with See problem oriented charting Chief Complaint  Patient presents with  . Hypertension  . Hyperlipidemia  . Diabetes    Blood sugar of 109 two days ago  . Gastroesophageal Reflux  . chest spasm    Started 3 weeks ago, seen by cardiologist - EKG normal - believes to be esophageal spasm. States that it does hurt when she is eating food   . Headache    Started Tuesday, no OTC medications helping    This visit occurred during the SARS-CoV-2 public health emergency.  Safety protocols were in place, including screening questions prior to the visit, additional usage of staff PPE, and extensive cleaning of exam room while observing appropriate contact time as indicated for disinfecting solutions.   Past Medical History-  Patient Active Problem List   Diagnosis Date Noted  . Diastolic congestive heart failure ([place]) 04/17/2019    Priority: High  . Type 2 diabetes mellitus ([place]) 03/11/2017    Priority: High  . PAD (peripheral artery disease) ([place]) 01/19/2017    Priority: High  . 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 Morbid obesity ([place]) 02/14/2016    Priority: High  . Tobacco use disorder 08/12/2015    Priority: High  . COPD (chronic obstructive pulmonary disease) ([place]) 01/18/2015    Priority: High  . CKD (chronic kidney disease), stage III ([place]) 09/18/2020    Priority: Medium  . Fatty liver 11/06/2019    Priority: Medium  . Hyperlipidemia 04/12/2018    Priority: Medium  . Hyperlipidemia associated with type 2 diabetes mellitus ([place]) 09/06/2017    Priority: Medium  . Breast mass 08/03/2017    Priority: Medium  . GERD (gastroesophageal reflux disease) 02/14/2016    Priority: Medium  . DOE (dyspnea on exertion) 03/08/2014    Priority: Medium  . Migraine without aura 07/26/2013     Priority: Medium  . Sleep apnea 02/11/2012    Priority: Medium  . Chest discomfort 10/12/2011    Priority: Medium  . Hypertension associated with diabetes ([place]) 10/12/2011    Priority: Medium  . Chronic hip pain 07/29/2017    Priority: Low  . Left knee pain 05/15/2016    Priority: Low  . Flank pain 04/21/2016    Priority: Low  . Abdominal pain 02/28/2016    Priority: Low  . Pulmonary nodule, right 04/11/2015    Priority: Low  . Mediastinal lymphadenopathy 01/18/2015    Priority: Low  . Chronic rhinitis 06/26/2019  . Trigger finger, right middle finger 05/04/2019  . Coronary artery calcification 04/20/2019  . Bilateral leg pain 04/14/2018  . Pulmonary infiltrates   . S/P bronchoscopy with biopsy     Medications- reviewed and updated Current Outpatient Medications  Medication Sig Dispense Refill  . albuterol (VENTOLIN HFA) 108 (90 Base) MCG/ACT inhaler Inhale 2 puffs into the lungs every 6 (six) hours as needed for wheezing or shortness of breath. 8 g 6  . Alirocumab (PRALUENT) 75 MG/ML SOAJ Inject 1 pen into the skin every 14 (fourteen) days. 6 mL 3  . aspirin EC 81 MG tablet Take 81 mg by mouth daily.    Marland Kitchen buPROPion (WELLBUTRIN SR) 150 MG 12 hr tablet Take 150 mg by mouth daily.    . carvedilol (COREG) 25 MG tablet Take 1 tablet (25 mg total) by mouth 2 (  two) times daily. 180 tablet 3  . dicyclomine (BENTYL) 10 MG capsule TAKE 1 CAPSULE (10 MG TOTAL) BY MOUTH 3 (THREE) TIMES DAILY BEFORE MEALS. AS NEEDED 270 capsule 3  . NEXLIZET 180-10 MG TABS TAKE 1 TABLET BY MOUTH EVERY DAY 90 tablet 3  . ONE TOUCH LANCETS MISC Use to check blood sugars twice a day 200 each 0  . OZEMPIC, 0.25 OR 0.5 MG/DOSE, 2 MG/1.5ML SOPN INJECT 0.5 MG INTO THE SKIN ONCE A WEEK. 3 pen 8  . pantoprazole (PROTONIX) 40 MG tablet TAKE 1 TABLET BY MOUTH EVERY DAY 90 tablet 1  . propranolol (INDERAL) 10 MG tablet Take one tablet by mouth, four times a day as needed. 60 tablet 6  . spironolactone (ALDACTONE)  25 MG tablet TAKE 1 TABLET BY MOUTH EVERY DAY 90 tablet 1  . telmisartan (MICARDIS) 40 MG tablet Take 1 tablet (40 mg total) by mouth daily. 90 tablet 3  . allopurinol (ZYLOPRIM) 300 MG tablet Take 1 tablet (300 mg total) by mouth daily. To lower uric acid and prevent gout. (Patient not taking: Reported on 03/21/2021) 60 tablet 3   No current facility-administered medications for this visit.     Objective:  BP 126/78   Pulse 77   Temp (!) 97.3 F (36.3 C) (Temporal)   Resp 16   Ht 5\' 6"  (1.676 m)   Wt 263 lb (119.3 kg)   SpO2 97%   BMI 42.45 kg/m  Gen: NAD, resting comfortably CV: RRR no murmurs rubs or gallops Lungs: CTAB no crackles, wheeze, rhonchi Ext: no edema Skin: warm, dry    Assessment and Plan   #CAD with chest pain possible GI origin- patient saw cardiology 3 weeks ago and EKG reassuring- they thought possible esophageal spasms. She has some pain when eating sometimes. Sleeping on left side causese discomfort and coughing- turns over and that helps. Reassuring cath in past. Compliant with aspirin, praluent and nexlizet - has a may 15th follow up with Dr. [name] PA. Compliant with pantoprazole 40 mg  #hypertension S: medication: Telnisartan 40Mg , Spironolactone 25Mg , Propanolol 10mg  as needed per cardiology, Carvedilol 25mg  twice daily BP Readings from Last 3 Encounters:  03/21/21 126/78  02/25/21 102/68  02/14/21 102/60  A/P: Stable. Continue current medications.   # Diabetes S: Medication: Ozempic 0.5 mg weekly CBGs- usually low 100s to 140s in Am Exercise and diet- up 6 lbs from last visit- new job with social services- has slipped up on diet including cokes- trying to correct this Lab Results  Component Value Date   HGBA1C 5.9 (H) 09/12/2020   HGBA1C 5.9 (A) 05/03/2020   HGBA1C 6.1 (A) 10/11/2019   A/P: hopefully controlled- update a1c- continue current meds for now  #hyperlipidemia S: Medication:Praluent injection every 2 weeks.  Also on nexlizet Lab  Results  Component Value Date   CHOL 104 06/19/2020   HDL 25 (L) 06/19/2020   LDLCALC 57 06/19/2020   TRIG 121 06/19/2020   CHOLHDL 4.2 06/19/2020   A/P: really good #s last check- continue current medicines  #Headaches- patient with headache since Monday- wonders if stress or allergies. She does have a history of intermittent headaches. Has seen neurology in the past- has had MRI in the past- was told is just going to get headaches- no worsening above baseline- will monitor. Nose running a fair amount and may be allergies- recommended flonase over the counter. If worsens will let us know- very typical for her  #COPD- has not had to  use albuterol recently. Overall stable- continue to monitor. Follows with Dr. Lamonte Sakai  #PAD- pain in legs with walking vascular has not thought was related to this. Yearly follow up with vascular. Remain on aspirin and keep lipid control.   # Obesity morbid S:poor control  Wt Readings from Last 3 Encounters:  03/21/21 263 lb (119.3 kg)  02/25/21 262 lb 3.2 oz (118.9 kg)  02/14/21 260 lb 6.4 oz (118.1 kg)  A/P: feels like work has been a new barrier- a lot of sweets brought in- we discussed importance of portion control and trying to minimize sweets. Encouraged exercise as able as well  Recommended follow up: Return in about 6 months (around 09/20/2021) for physical or sooner if needed. Future Appointments  Date Time Provider [place]  03/27/2021  2:45 PM Gregor Hams, MD [facility] None  04/01/2021 11:00 AM Willia Craze, NP [facility]  05/13/2021 12:15 PM Liliane Shi, PA-C [facility]   Lab/Order associations:   ICD-10-CM   1. Hypertension associated with diabetes ([place])  E11.59    I15.2   2. Type 2 diabetes mellitus with hyperglycemia, without long-term current use of insulin (HCC)  E11.65 Hemoglobin A1c    Comprehensive metabolic panel  3. Hyperlipidemia associated with type 2 diabetes mellitus ([place])  E11.69    E78.5    4. Stage 3 chronic kidney disease, unspecified whether stage 3a or 3b CKD (HCC)  N18.30   5. Hyperlipidemia, unspecified hyperlipidemia type  E78.5   6. Chronic obstructive pulmonary disease, unspecified COPD type (HCC) Chronic J44.9   7. PAD (peripheral artery disease) (HCC) Chronic I73.9   8. Morbid obesity ([place]) Chronic E66.01    No orders of the defined types were placed in this encounter.  Return precautions advised.  Garret Reddish, MD

## 2021-03-21 ENCOUNTER — Ambulatory Visit (INDEPENDENT_AMBULATORY_CARE_PROVIDER_SITE_OTHER): Payer: Managed Care, Other (non HMO) | Admitting: Family Medicine

## 2021-03-21 ENCOUNTER — Encounter: Payer: Self-pay | Admitting: Family Medicine

## 2021-03-21 ENCOUNTER — Other Ambulatory Visit: Payer: Self-pay

## 2021-03-21 VITALS — BP 126/78 | HR 77 | Temp 97.3°F | Resp 16 | Ht 66.0 in | Wt 263.0 lb

## 2021-03-21 DIAGNOSIS — E1159 Type 2 diabetes mellitus with other circulatory complications: Secondary | ICD-10-CM | POA: Diagnosis not present

## 2021-03-21 DIAGNOSIS — E1169 Type 2 diabetes mellitus with other specified complication: Secondary | ICD-10-CM

## 2021-03-21 DIAGNOSIS — N183 Chronic kidney disease, stage 3 unspecified: Secondary | ICD-10-CM | POA: Diagnosis not present

## 2021-03-21 DIAGNOSIS — J449 Chronic obstructive pulmonary disease, unspecified: Secondary | ICD-10-CM

## 2021-03-21 DIAGNOSIS — E1165 Type 2 diabetes mellitus with hyperglycemia: Secondary | ICD-10-CM | POA: Diagnosis not present

## 2021-03-21 DIAGNOSIS — E785 Hyperlipidemia, unspecified: Secondary | ICD-10-CM

## 2021-03-21 DIAGNOSIS — I739 Peripheral vascular disease, unspecified: Secondary | ICD-10-CM

## 2021-03-21 DIAGNOSIS — I152 Hypertension secondary to endocrine disorders: Secondary | ICD-10-CM

## 2021-03-21 LAB — COMPREHENSIVE METABOLIC PANEL
ALT: 26 U/L (ref 0–35)
AST: 15 U/L (ref 0–37)
Albumin: 4.1 g/dL (ref 3.5–5.2)
Alkaline Phosphatase: 62 U/L (ref 39–117)
BUN: 29 mg/dL — ABNORMAL HIGH (ref 6–23)
CO2: 26 mEq/L (ref 19–32)
Calcium: 10.6 mg/dL — ABNORMAL HIGH (ref 8.4–10.5)
Chloride: 105 mEq/L (ref 96–112)
Creatinine, Ser: 1.21 mg/dL — ABNORMAL HIGH (ref 0.40–1.20)
GFR: 49.75 mL/min — ABNORMAL LOW (ref >60.00)
Glucose, Bld: 97 mg/dL (ref 70–99)
Potassium: 4.2 mEq/L (ref 3.5–5.1)
Sodium: 139 mEq/L (ref 135–145)
Total Bilirubin: 0.6 mg/dL (ref 0.2–1.2)
Total Protein: 7.6 g/dL (ref 6.0–8.3)

## 2021-03-21 LAB — HEMOGLOBIN A1C: Hgb A1c MFr Bld: 6.5 % (ref 4.6–6.5)

## 2021-03-25 ENCOUNTER — Other Ambulatory Visit: Payer: Self-pay | Admitting: Emergency Medicine

## 2021-03-26 ENCOUNTER — Other Ambulatory Visit: Payer: Self-pay | Admitting: Family Medicine

## 2021-03-27 ENCOUNTER — Ambulatory Visit: Payer: Managed Care, Other (non HMO) | Admitting: Family Medicine

## 2021-04-01 ENCOUNTER — Telehealth: Payer: Self-pay | Admitting: Family Medicine

## 2021-04-01 ENCOUNTER — Ambulatory Visit (INDEPENDENT_AMBULATORY_CARE_PROVIDER_SITE_OTHER): Payer: Managed Care, Other (non HMO) | Admitting: Nurse Practitioner

## 2021-04-01 ENCOUNTER — Encounter: Payer: Self-pay | Admitting: Nurse Practitioner

## 2021-04-01 ENCOUNTER — Other Ambulatory Visit: Payer: Self-pay

## 2021-04-01 VITALS — Ht 66.0 in | Wt 262.8 lb

## 2021-04-01 DIAGNOSIS — K219 Gastro-esophageal reflux disease without esophagitis: Secondary | ICD-10-CM

## 2021-04-01 DIAGNOSIS — R0789 Other chest pain: Secondary | ICD-10-CM | POA: Diagnosis not present

## 2021-04-01 DIAGNOSIS — E041 Nontoxic single thyroid nodule: Secondary | ICD-10-CM

## 2021-04-01 MED ORDER — PANTOPRAZOLE SODIUM 40 MG PO TBEC: DELAYED_RELEASE_TABLET | ORAL | 1 refills | Status: DC

## 2021-04-01 NOTE — Telephone Encounter (Signed)
Called and lm on pt vm tcb. 

## 2021-04-01 NOTE — Patient Instructions (Signed)
Take Pantoprazole 33min before breakfast daily.   You have been scheduled for a Barium Esophogram at [facility] (1st floor of the hospital) on 04/08/21 at 11:00am. Please arrive 15 minutes prior to your appointment for registration. Make certain not to have anything to eat or drink 3 hours prior to your test. If you need to reschedule for any reason, please contact radiology at [phone] to do so. __________________________________________________________________ A barium swallow is an examination that concentrates on views of the esophagus. This tends to be a double contrast exam (barium and two liquids which, when combined, create a gas to distend the wall of the oesophagus) or single contrast (non-ionic iodine based). The study is usually tailored to your symptoms so a good history is essential. Attention is paid during the study to the form, structure and configuration of the esophagus, looking for functional disorders (such as aspiration, dysphagia, achalasia, motility and reflux) EXAMINATION You may be asked to change into a gown, depending on the type of swallow being performed. A radiologist and radiographer will perform the procedure. The radiologist will advise you of the type of contrast selected for your procedure and direct you during the exam. You will be asked to stand, sit or lie in several different positions and to hold a small amount of fluid in your mouth before being asked to swallow while the imaging is performed .In some instances you may be asked to swallow barium coated marshmallows to assess the motility of a solid food bolus. The exam can be recorded as a digital or video fluoroscopy procedure. POST PROCEDURE It will take 1-2 days for the barium to pass through your system. To facilitate this, it is important, unless otherwise directed, to increase your fluids for the next 24-48hrs and to resume your normal diet.  This test typically takes about 30 minutes to  perform.  Due to recent changes in healthcare laws, you may see the results of your imaging and laboratory studies on MyChart before your provider has had a chance to review them.  We understand that in some cases there may be results that are confusing or concerning to you. Not all laboratory results come back in the same time frame and the provider may be waiting for multiple results in order to interpret others.  Please give [name] 48 hours in order for your provider to thoroughly review all the results before contacting the office for clarification of your results.   Thank you for choosing me and [facility].  Tye Savoy NP

## 2021-04-01 NOTE — Progress Notes (Signed)
ASSESSMENT AND PLAN    # 60 yo female with chest discomfort, feels like a "fluttering". Also sometimes feels like food moves slowly down esophagus but no dysphagia. Recent cardiac evaluation negative and GI evaluation recommended.  --We talked about testing options including EGD vrs barium swallow. She has COPD and interstitial lung disease and is nervous about sedation. Her symptoms have improved over last couple of weeks.  At this point she prefer a barium swallow with tablet.   # GERD, occasional breakthrough heartburn on daily Pantoprazole relieved with Takes --Change timing of PPI to 30 minutes BEFORE breakfast.   #Thyroid nodule, enlarging 2.1 cm thyroid nodule on chest CT scan in December. Patient unaware of the nodule .  --I will send PCP a message to make sure nodule doesn't need any further evaluation.  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 Followed by Pulmonary.    HISTORY OF PRESENT ILLNESS    Chief Complaint : fluttering in chest  Lisa [name] is a 60 y.o. female known to Dr. Fuller Plan with a past medical history significant for diverticulosis, hypertension, hyperlipidemia, PAD, fatty liver, diabetes, obesity, sleep apnea, respiratory bronchiolitis associated interstitial lung disease,  CKD. See PMH below for any additional medical problems.   June 2021 -office visit with Dr. Fuller Plan for evaluation of constipation, LLQ pain felt possibly to be musculoskeletal in nature. Continued on MiraLAX and Bentyl.  Scheduled for colonoscopy   INTERVAL HISTORY: In March patient began having transient fluttering / twitching sensations in her chest. Episodes are random, not related to eating or drinking. She saw her Cardiologist and had a holter placed . She was told eerything was okay and GI follow up advised. She had fluttering episodes while wearing the holter and apparently had some PVCs but there was poor correlation between the two .  Cardiology  thought fluttering could be stress related but recommended GI evaluation.  The fluttering episodes are transient, she may have several episodes in a row. The fluttering is associated with a sensation (in her head) of being choked. She doesn't actually feel pressure around her neck.  In the last two weeks she hasn't had any of these symptoms. Patient doesn't feel she has been under an usual amount of stress at home or work.  Patient takes Tums for breakthrough GERD symptoms occuring 2-3 times a month. She takes Pantoprazole every morning after breakfast. She sometimes feels like food moves slowly down her esophagus but denies dysphagia .    DATA REVIEWED:  02/14/2021 Normal CBC  03/21/2021 Creatinine 1.21, GFR 49.7 Normal liver chemistries  PREVIOUS ENDOSCOPIC EVALUATIONS / PERTINENT STUDIES:   July 2021 colonoscopy for constipation and LLQ pain -- Complete exam, good prep -- 3 sessile polyps in the rectum and transverse colon measuring 6 to 8 mm.  Left-sided diverticulosis.  Narrowing of the colon in association with the diverticular opening.  Evidence of diverticular spasm.  Peridiverticular erythema.  Internal hemorrhoids   Past Medical History:  Diagnosis Date  . Allergic rhinitis   . Arthritis   . Asthma   . Breast nodule    right breast, to see dr Helane Rima 06-20-2012 for   . Chest pain, non-cardiac   . Chronic headaches   . Cigarette smoker   . Complication of anesthesia 05-17-2012   trouble breathing after colonscopy, needed nebulizer  . COPD (chronic obstructive pulmonary disease) ([place])   . Diastolic congestive heart failure ([place]) 04/17/2019  . Diverticulosis   . GERD (gastroesophageal  reflux disease)   . Hepatic hemangioma   . Hypertension   . IBS (irritable bowel syndrome)   . Nuclear stress test    Myoview 05/2019: EF 63, no ischemia. Low Risk  . Obesity   . Prosthetic eye globe   . Pyloric erosion   . Sleep apnea    CPAP setting varies from 4-10  . Type 2 diabetes  mellitus ([place]) 03/11/2017    Current Medications, Allergies, Past Surgical History, Family History and Social History were reviewed in [employer] record.   Current Outpatient Medications  Medication Sig Dispense Refill  . Alirocumab (PRALUENT) 75 MG/ML SOAJ Inject 1 pen into the skin every 14 (fourteen) days. 6 mL 3  . buPROPion (WELLBUTRIN SR) 150 MG 12 hr tablet Take 150 mg by mouth daily.    . carvedilol (COREG) 25 MG tablet Take 1 tablet (25 mg total) by mouth 2 (two) times daily. 180 tablet 3  . dicyclomine (BENTYL) 10 MG capsule TAKE 1 CAPSULE (10 MG TOTAL) BY MOUTH 3 (THREE) TIMES DAILY BEFORE MEALS. AS NEEDED 270 capsule 3  . NEXLIZET 180-10 MG TABS TAKE 1 TABLET BY MOUTH EVERY DAY 90 tablet 3  . ONE TOUCH LANCETS MISC Use to check blood sugars twice a day 200 each 0  . OZEMPIC, 0.25 OR 0.5 MG/DOSE, 2 MG/1.5ML SOPN INJECT 0.5 MG INTO THE SKIN ONCE A WEEK. 3 pen 8  . pantoprazole (PROTONIX) 40 MG tablet TAKE 1 TABLET BY MOUTH EVERY DAY 90 tablet 1  . spironolactone (ALDACTONE) 25 MG tablet TAKE 1 TABLET BY MOUTH EVERY DAY 90 tablet 1  . telmisartan (MICARDIS) 40 MG tablet Take 1 tablet (40 mg total) by mouth daily. 90 tablet 3   No current facility-administered medications for this visit.    Review of Systems: No chest pain. No shortness of breath. No urinary complaints.   PHYSICAL EXAM :    Wt Readings from Last 3 Encounters:  04/01/21 262 lb 12.8 oz (119.2 kg)  03/21/21 263 lb (119.3 kg)  02/25/21 262 lb 3.2 oz (118.9 kg)    Ht 5\' 6"  (1.676 m)   Wt 262 lb 12.8 oz (119.2 kg)   BMI 42.42 kg/m  Constitutional:  Pleasant female in no acute distress. Psychiatric: Normal mood and affect. Behavior is normal. EENT: Pupils normal.  Conjunctivae are normal. No scleral icterus. Neck supple.  Cardiovascular: Normal rate, regular rhythm. No edema Pulmonary/chest: Effort normal and breath sounds normal. No wheezing, rales or rhonchi. Abdominal: Soft,  nondistended, nontender. Bowel sounds active throughout. There are no masses palpable.  Neurological: Alert and oriented to person place and time. Skin: Skin is warm and dry. No rashes noted.  Tye Savoy, NP  04/01/2021, 11:21 AM  Cc:  Marin Olp, MD

## 2021-04-01 NOTE — Telephone Encounter (Signed)
Patient with thyroid nodule that appeared to be larger on last CT scan of the chest.  I had not previously noted this.  I want to say an extra special thank you to Aline Brochure, NP who brought this to my attention.  Team please let patient know we will call her within 2 weeks to schedule the ultrasound to further evaluate this

## 2021-04-01 NOTE — Progress Notes (Signed)
Reviewed and agree with management plan.  Lavonda Thal T. Aztlan Coll, MD FACG [phone]  

## 2021-04-02 ENCOUNTER — Other Ambulatory Visit: Payer: Self-pay

## 2021-04-02 MED ORDER — CARVEDILOL 25 MG PO TABS: 25.0000 mg | ORAL_TABLET | Freq: Two times a day (BID) | ORAL | 3 refills | Status: DC

## 2021-04-03 ENCOUNTER — Other Ambulatory Visit: Payer: Self-pay | Admitting: Family Medicine

## 2021-04-08 ENCOUNTER — Ambulatory Visit (HOSPITAL_COMMUNITY)
Admission: RE | Admit: 2021-04-08 | Discharge: 2021-04-08 | Disposition: A | Discharge status: Home / Self Care | Payer: Managed Care, Other (non HMO) | Source: Ambulatory Visit | Attending: Nurse Practitioner | Admitting: Nurse Practitioner

## 2021-04-08 ENCOUNTER — Other Ambulatory Visit: Payer: Self-pay

## 2021-04-08 ENCOUNTER — Other Ambulatory Visit: Payer: Self-pay | Admitting: Nurse Practitioner

## 2021-04-08 DIAGNOSIS — K219 Gastro-esophageal reflux disease without esophagitis: Secondary | ICD-10-CM | POA: Insufficient documentation

## 2021-04-08 DIAGNOSIS — R0789 Other chest pain: Secondary | ICD-10-CM | POA: Diagnosis present

## 2021-04-17 ENCOUNTER — Other Ambulatory Visit: Payer: Self-pay | Admitting: Family Medicine

## 2021-04-17 ENCOUNTER — Ambulatory Visit
Admission: RE | Admit: 2021-04-17 | Discharge: 2021-04-17 | Disposition: A | Discharge status: Home / Self Care | Payer: Managed Care, Other (non HMO) | Source: Ambulatory Visit | Attending: Family Medicine | Admitting: Family Medicine

## 2021-04-17 DIAGNOSIS — E041 Nontoxic single thyroid nodule: Secondary | ICD-10-CM

## 2021-04-22 ENCOUNTER — Other Ambulatory Visit (HOSPITAL_COMMUNITY)
Admission: RE | Admit: 2021-04-22 | Discharge: 2021-04-22 | Disposition: A | Discharge status: Home / Self Care | Payer: Managed Care, Other (non HMO) | Source: Ambulatory Visit | Attending: Family Medicine | Admitting: Family Medicine

## 2021-04-22 ENCOUNTER — Ambulatory Visit
Admission: RE | Admit: 2021-04-22 | Discharge: 2021-04-22 | Disposition: A | Discharge status: Home / Self Care | Payer: Managed Care, Other (non HMO) | Source: Ambulatory Visit | Attending: Family Medicine | Admitting: Family Medicine

## 2021-04-22 DIAGNOSIS — E041 Nontoxic single thyroid nodule: Secondary | ICD-10-CM

## 2021-04-23 LAB — CYTOLOGY - NON PAP

## 2021-04-24 ENCOUNTER — Other Ambulatory Visit: Payer: Self-pay | Admitting: Family Medicine

## 2021-04-24 ENCOUNTER — Telehealth: Payer: Self-pay

## 2021-04-24 DIAGNOSIS — C73 Malignant neoplasm of thyroid gland: Secondary | ICD-10-CM

## 2021-04-24 NOTE — Telephone Encounter (Signed)
Called and spoke with pt and made aware Dr. Yong Channel has not had a chance to look at/comment on her results but once he does they will show in [place] and I will also call her.

## 2021-04-24 NOTE — Telephone Encounter (Signed)
Pt called asking if someone would call her about her biopsy results today. Pt has seen them on MyChart but does not understand and she is very worried. Please advise.

## 2021-04-25 NOTE — Telephone Encounter (Signed)
Patient is returning a call from [place], wondering if someone can give her a call.

## 2021-04-25 NOTE — Telephone Encounter (Signed)
See result notes. 

## 2021-05-09 ENCOUNTER — Encounter (HOSPITAL_COMMUNITY): Payer: Self-pay

## 2021-05-13 ENCOUNTER — Ambulatory Visit: Payer: Managed Care, Other (non HMO) | Admitting: Physician Assistant

## 2021-05-16 ENCOUNTER — Other Ambulatory Visit: Payer: Self-pay | Admitting: Cardiovascular Disease

## 2021-05-21 ENCOUNTER — Ambulatory Visit: Payer: Managed Care, Other (non HMO) | Admitting: Physician Assistant

## 2021-05-21 NOTE — Progress Notes (Deleted)
Cardiology Office Note:    Date:  05/21/2021   ID:  Lisa [name], DOB 07-Feb-1961, MRN [id number]  PCP:  Marin Olp, MD   [place] Providers Cardiologist:  Mertie Moores, MD { Click to update primary MD,subspecialty MD or APP then REFRESH:1}  ***  Referring MD: Marin Olp, MD   Chief Complaint:  No chief complaint on file.    Patient Profile:    Lisa [name] is a 60 y.o. female with:  Peripheral arterial disease S/p L SFA angioplasty in 2018 Minimal coronary plaque on cath in 2009 Myoview 05/2019: low risk  COPD +cigs  Hypertension  Hyperlipidemia  Diabetes mellitus  GERD OSA  PVCs  Prior CV studies: Echocardiogram 03/13/21  1. Left ventricular ejection fraction, by estimation, is 60 to 65%. The  left ventricle has normal function. The left ventricle has no regional  wall motion abnormalities. There is moderate-to-severe assymetric  hypertrophy of the basal-septal segment. The   rest of the LV segments demonstrate moderate LVH. Left ventricular  diastolic parameters are consistent with Grade II diastolic dysfunction  (pseudonormalization).   2. Right ventricular systolic function is normal. The right ventricular  size is normal.   3. The mitral valve is normal in structure. Trivial mitral valve  regurgitation. No evidence of mitral stenosis.   4. The aortic valve is tricuspid. There is mild calcification of the  aortic valve. There is mild thickening of the aortic valve. Aortic valve  regurgitation is trivial. Mild aortic valve sclerosis is present, with no  evidence of aortic valve stenosis.   5. The inferior vena cava is normal in size with greater than 50%  respiratory variability, suggesting right atrial pressure of 3 mmHg.   LONG TERM MONITOR (3-7 DAYS) INTERPRETATION 03/27/2021 Narrative  Normal sinus rhythm  Occasional PVCs  No significant arrhythmias  Patch Wear Time:  4 days and 23 hours (2022-03-23T07:24:42-0400 to  2022-03-28T07:02:38-0400)  Patient had a min HR of 58 bpm, max HR of 107 bpm, and avg HR of 80 bpm. Predominant underlying rhythm was Sinus Rhythm. Isolated SVEs were rare (<1.0%), and no SVE Couplets or SVE Triplets were present. Isolated VEs were occasional (2.1%, 9804), VE Couplets were rare (<1.0%, 1), and no VE Triplets were present. Inverted QRS complexes possibly due to inverted placement of device.  GATED SPECT MYO PERF W/LEXISCAN STRESS 1D 05/15/2019 Narrative  Nuclear stress EF: 63%. No wall motion abnormality.  There was no ST segment deviation noted during stress.  This is a low risk study. No ischemia identified.  Carotid US 09/07/17 Bilat ICA < 40   Echo 08/18/17 Mild LVH, EF 60-65, mild LAE   Echo 07/25/15 Mild LVH, EF 55-60, no RWMA, Gr 1 DD, mild LAE   Myoview 03/06/14 Small amount of ischemia in apical anterior and true apex (reviewed by Dr Acie Fredrickson >> ?breast atten), EF 73; Intermediate Risk   Cardiac Catheterization 12/28/2007 LAD irregs RI irregs LCx irregs RCA irregs EF 75   History of Present Illness: Ms. Minor was last seen by Dr. Acie Fredrickson in 3/22 for chest pain felt to be related to PVCs.  A f/u echocardiogram showed normal EF.  Event monitor showed PVCs.  Her carvedilol was increased and she was given Propranolol to use prn.  TSH was normal.  She returns for f/u.  ***        Past Medical History:  Diagnosis Date   Allergic rhinitis    Arthritis    Asthma  Breast nodule    right breast, to see dr Helane Rima 06-20-2012 for    Chest pain, non-cardiac    Chronic headaches    Cigarette smoker    Complication of anesthesia 05-17-2012   trouble breathing after colonscopy, needed nebulizer   COPD (chronic obstructive pulmonary disease) ([place])    Diastolic congestive heart failure ([place]) 04/17/2019   Diverticulosis    GERD (gastroesophageal reflux disease)    Hepatic hemangioma    Hypertension    IBS (irritable bowel syndrome)    Nuclear stress test     Myoview 05/2019: EF 63, no ischemia. Low Risk   Obesity    Prosthetic eye globe    Pyloric erosion    Sleep apnea    CPAP setting varies from 4-10   Type 2 diabetes mellitus ([place]) 03/11/2017    Current Medications: No outpatient medications have been marked as taking for the 05/21/21 encounter (Appointment) with Richardson Dopp T, PA-C.     Allergies:   Almond oil, Apple, Ceftin [cefuroxime], Cherry extract, Other, Guaifenesin & derivatives, Hctz [hydrochlorothiazide], Naproxen, and Lisinopril   Social History   Tobacco Use   Smoking status: Every Day    Packs/day: 1.50    Years: 38.00    Pack years: 57.00    Types: Cigarettes   Smokeless tobacco: Never   Tobacco comments:    10 cigarettes daily ARJ 09/10/2020  Vaping Use   Vaping Use: Never used  Substance Use Topics   Alcohol use: No    Alcohol/week: 0.0 standard drinks   Drug use: No     Family Hx: The patient's family history includes Breast cancer in an other family member; Diabetes in her maternal grandmother; Heart attack in her paternal uncle; Heart disease in her father; Hypertension in her mother; Lung cancer in her mother. There is no history of Colon cancer, Esophageal cancer, or Pancreatic cancer.  ROS   EKGs/Labs/Other Test Reviewed:    EKG:  EKG is *** ordered today.  The ekg ordered today demonstrates ***  Recent Labs: 02/14/2021: Hemoglobin 15.1; Platelets 317; TSH 1.190 03/21/2021: ALT 26; BUN 29; Creatinine, Ser 1.21; Potassium 4.2; Sodium 139   Recent Lipid Panel Lab Results  Component Value Date/Time   CHOL 104 06/19/2020 07:58 AM   TRIG 121 06/19/2020 07:58 AM   HDL 25 (L) 06/19/2020 07:58 AM   LDLCALC 57 06/19/2020 07:58 AM      Risk Assessment/Calculations:   {Does this patient have ATRIAL FIBRILLATION?:[phone]}  Physical Exam:    VS:  There were no vitals taken for this visit.    Wt Readings from Last 3 Encounters:  04/01/21 262 lb 12.8 oz (119.2 kg)  03/21/21 263 lb (119.3 kg)   02/25/21 262 lb 3.2 oz (118.9 kg)     Physical Exam ***     ASSESSMENT & PLAN:    Chest pain She has had some chest discomfort off and on for a couple of months.  This seems to be more consistent with a gastrointestinal etiology than ischemia.  However, with diabetes, she is at higher risk for progressive coronary artery disease.  She had some calcification noted cardiac catheterization in 2009.  Last stress test was in 2015.  I recommend that we proceed with a Lexiscan Myoview to rule out ischemia.             -Arrange Lexiscan Myoview   Coronary artery calcification As noted, she had minimal plaque on cardiac catheterization in 2009.  Proceed with stress testing as  noted.  Continue aspirin, statin.   PAD (peripheral artery disease) ([place]) She has had recent leg issues.  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  She has had 2 injections with mixed results.  She is followed by vascular surgery for peripheral arterial disease.   Hypertension associated with diabetes ([name]) Blood pressure has been well controlled.  There was a shortage of losartan and she was switched to telmisartan.  She has checked her blood pressure at times prior to her medications and is been optimal.  Overall, she thinks that her blood pressure is better controlled than it was previously on losartan.  I have recommended that she remain on telmisartan for now.  I have asked her to check her blood pressure 2-3 times a day for 2 weeks and send me those readings.   Hyperlipidemia, unspecified hyperlipidemia type She has had problems with leg pain related to statin therapy.  She stopped atorvastatin for 1 week with markedly improved symptoms.  I recommend that we switch her from atorvastatin to rosuvastatin.             -DC atorvastatin             -Start rosuvastatin 10 mg daily             -Arrange fasting lipids and LFTs in 3 months             -If she has recurrent myalgias, consider ezetimibe  versus referral to lipid clinic {Are you ordering a CV Procedure (e.g. stress test, cath, DCCV, TEE, etc)?   Press F2        :[id number]}    Dispo:  No follow-ups on file.   Medication Adjustments/Labs and Tests Ordered: Current medicines are reviewed at length with the patient today.  Concerns regarding medicines are outlined above.  Tests Ordered: No orders of the defined types were placed in this encounter.  Medication Changes: No orders of the defined types were placed in this encounter.   Signed, Richardson Dopp, PA-C  05/21/2021 1:38 PM    [facility] [address], [place], [place]  [address] Phone: [phone]; Fax: [phone]

## 2021-05-23 ENCOUNTER — Ambulatory Visit (INDEPENDENT_AMBULATORY_CARE_PROVIDER_SITE_OTHER): Payer: Managed Care, Other (non HMO) | Admitting: Internal Medicine

## 2021-05-23 ENCOUNTER — Encounter: Payer: Self-pay | Admitting: Internal Medicine

## 2021-05-23 ENCOUNTER — Other Ambulatory Visit: Payer: Self-pay

## 2021-05-23 VITALS — BP 130/70 | Ht 65.75 in | Wt 263.2 lb

## 2021-05-23 DIAGNOSIS — D34 Benign neoplasm of thyroid gland: Secondary | ICD-10-CM | POA: Diagnosis not present

## 2021-05-23 DIAGNOSIS — R635 Abnormal weight gain: Secondary | ICD-10-CM | POA: Diagnosis not present

## 2021-05-23 NOTE — Progress Notes (Signed)
Name: Lisa [name]  MRN/ DOB: [id number], 1961-08-13    Age/ Sex: 60 y.o., female    PCP: Marin Olp, MD   Reason for Endocrinology Evaluation: Abnormal FNA     Date of Initial Endocrinology Evaluation: 05/23/2021     HPI: [name] is a 60 y.o. female with a past medical history of HTN, Migraine headaches, T2DM  and PAD. The patient presented for initial endocrinology clinic visit on 05/23/2021 for consultative assistance with her MNG.   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     She denies local neck symptoms  She has no radiation exposure  NO FH of thyroid cancer   Weight stable  Denies constipation but has occasional diarrhea for the past few days that could be attributed to diverticulosis    Pt has been noted with weight gain over the years , she has a recent diagnosis of DM and in review of her records her potassium was low in 2018 at 3.2 mmol/L    HISTORY:  Past Medical History:  Past Medical History:  Diagnosis Date   Allergic rhinitis    Arthritis    Asthma    Breast nodule    right breast, to see dr Helane Rima 06-20-2012 for    Chest pain, non-cardiac    Chronic headaches    Cigarette smoker    Complication of anesthesia 05-17-2012   trouble breathing after colonscopy, needed nebulizer   COPD (chronic obstructive pulmonary disease) ([place])    Diastolic congestive heart failure ([place]) 04/17/2019   Diverticulosis    GERD (gastroesophageal reflux disease)    Hepatic hemangioma    Hypertension    IBS (irritable bowel syndrome)    Nuclear stress test    Myoview 05/2019: EF 63, no ischemia. Low Risk   Obesity    Prosthetic eye globe     Pyloric erosion    Sleep apnea    CPAP setting varies from 4-10   Type 2 diabetes mellitus ([place]) 03/11/2017   Past Surgical History:  Past Surgical History:  Procedure Laterality Date   ABDOMINAL AORTOGRAM W/LOWER EXTREMITY N/A 04/30/2017   Procedure: Abdominal Aortogram w/Lower Extremity;  Surgeon: Elam Dutch, MD;  Location: [facility];  Service: Cardiovascular;  Laterality: N/A;   CARDIAC CATHETERIZATION  12/28/2007   EF 75%. IT REVEALS NORMAL/SUPRANORMAL LEFT VENTRICULAR SYSTOLIC FUNCTION   CARDIOVASCULAR STRESS TEST  03/25/2007   EF 78%   CESAREAN SECTION  2003, 2009   x 2   CHOLECYSTECTOMY  1993   COLONOSCOPY  2014   ENDOMETRIAL ABLATION  09/2010   and D&C   HERNIA REPAIR  1914   umbilical   LAPAROSCOPY     PERIPHERAL VASCULAR BALLOON ANGIOPLASTY Left 04/30/2017   Procedure: Peripheral Vascular Balloon Angioplasty;  Surgeon: Elam Dutch, MD;  Location: [facility];  Service: Cardiovascular;  Laterality: Left;  SFA   RECONSTRUCTION OF EYELID Bilateral    TUBAL LIGATION     UMBILICAL HERNIA REPAIR  2004   US ECHOCARDIOGRAPHY  05/08/2008   EF 55-60%   VENTRAL HERNIA REPAIR  06/15/2012   Procedure: LAPAROSCOPIC VENTRAL HERNIA;  Surgeon: Edward Jolly, MD;  Location: [facility];  Service: General;  Laterality: N/A;  LAPAROSCOPIC REPAIR VENTRAL HERNIA   VIDEO BRONCHOSCOPY Bilateral 11/15/2017   Procedure: VIDEO BRONCHOSCOPY WITH FLUORO;  Surgeon: Collene Gobble, MD;  Location: [facility];  Service: Cardiopulmonary;  Laterality: Bilateral;    Social History:  reports that she has been smoking cigarettes. She has a 57.00 pack-year smoking history. She has never used smokeless tobacco. She reports that she does not drink alcohol and does not use drugs. Family History: family history includes Breast cancer in an other family member; Diabetes in her maternal grandmother; Heart attack in her paternal uncle; Heart disease in her father; Hypertension in her mother;  Lung cancer in her mother.   HOME MEDICATIONS: Allergies as of 05/23/2021       Reactions   Almond Oil Anaphylaxis, Nausea And Vomiting   Almond-Nuts Only   Apple Anaphylaxis   Ceftin [cefuroxime] Other (See Comments)   Severe stomach pain - Diverticulitis flares up   Cherry Extract Anaphylaxis   Cherry fruit only   Other Anaphylaxis, Itching   Most fruits- Are NOT tolerated (PLEASE ASK PATIENT BEFORE GIVING, AS CERTAIN FRUITS ARE TOLERATED IN LIMITED QUANTITIES!!)   Guaifenesin & Derivatives Other (See Comments)   Restlessness, jittery   Hctz [hydrochlorothiazide] Other (See Comments)   Causes a headache   Naproxen Other (See Comments)   Insomnia   Lisinopril Cough           Medication List        Accurate as of May 23, 2021 11:35 AM. If you have any questions, ask your nurse or doctor.          buPROPion 150 MG 12 hr tablet Commonly known as: WELLBUTRIN SR Take 150 mg by mouth daily.   carvedilol 25 MG tablet Commonly known as: COREG Take 1 tablet (25 mg total) by mouth 2 (two) times daily.   dicyclomine 10 MG capsule Commonly known as: BENTYL TAKE 1 CAPSULE (10 MG TOTAL) BY MOUTH 3 (THREE) TIMES DAILY BEFORE MEALS. AS NEEDED   Nexlizet 180-10 MG Tabs Generic drug: Bempedoic Acid-Ezetimibe TAKE 1 TABLET BY MOUTH EVERY DAY   ONE TOUCH LANCETS Misc Use to check blood sugars twice a day   Ozempic (0.25 or 0.5 MG/DOSE) 2 MG/1.5ML Sopn Generic drug: Semaglutide(0.25 or 0.5MG /DOS) INJECT 0.5 MG INTO THE SKIN ONCE A WEEK.   pantoprazole 40 MG tablet Commonly known as: PROTONIX Take 1 tablet by mouth 30 mins before breakfast daily.   Praluent 75 MG/ML Soaj Generic drug: Alirocumab Inject 1 pen into the skin every 14 (fourteen) days.   spironolactone 25 MG tablet Commonly known as: ALDACTONE TAKE 1 TABLET BY MOUTH EVERY DAY   telmisartan 40 MG tablet Commonly known as: Micardis Take 1 tablet (40 mg total) by mouth daily.          REVIEW OF  SYSTEMS: A comprehensive ROS was conducted with the patient and is negative except as per HPI     OBJECTIVE:  VS: BP 130/70   Ht 5' 5.75" (1.67 m)   Wt 263 lb 3.2 oz (119.4 kg)   BMI 42.81 kg/m    Wt Readings from Last 3 Encounters:  05/23/21 263 lb 3.2 oz (119.4 kg)  04/01/21 262 lb 12.8 oz (119.2 kg)  03/21/21 263 lb (119.3 kg)     EXAM: General: Pt appears well and is in NAD  Eyes: External eye exam normal without stare, lid lag or exophthalmos.  EOM intact.  PERRL.  Neck: General: Supple without adenopathy. Thyroid: Difficult to palpate due to body habitus   Lungs: Clear with good BS bilat with no rales, rhonchi, or wheezes  Heart: Auscultation: RRR.  Abdomen: Normoactive bowel sounds, soft, nontender, without masses or organomegaly palpable  Extremities: Gait and station: Normal gait  Digits and nails: No clubbing, cyanosis, petechiae, or nodes Head and neck: Normal alignment and mobility BL UE: Normal ROM and strength. BL LE: No pretibial edema normal ROM and strength.  Skin: Hair: Texture and amount normal with gender appropriate distribution Skin Inspection: No rashes, acanthosis nigricans/skin tags. No lipohypertrophy Skin Palpation: Skin temperature, texture, and thickness normal to palpation  Mental Status: Judgment, insight: Intact Mood and affect: No depression, anxiety, or agitation     DATA REVIEWED: Results for ALYVIA, DERK (MRN [id number]) as of 05/23/2021 11:27  Ref. Range 02/14/2021 15:03  TSH Latest Ref Range: 0.450 - 4.500 uIU/mL 1.190   FNA 04/22/2021  Clinical History: Left; Inferior 2.6cm; Other 2 dimensions: 2.4 x 2.0cm,  Solid /almost completely solid, Hypoechoic, TI-RADS total points 4.  Specimen Submitted:  A. 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  (Bethesda category IV)   Afirma Benign     Thyroid Ultrasound 04/17/2021    Parenchymal Echotexture: Mildly heterogenous   Isthmus: Borderline enlarged measuring 0.9 cm in diameter   Right lobe: Borderline enlarged measuring 5.7 x 1.8 x 2.1 cm   Left lobe: Borderline enlarged measuring 5.9 x 2.9 x 2.7 cm   _________________________________________________________   Estimated total number of nodules >/= 1 cm: 2   Number of spongiform nodules >/=  2 cm not described below (TR1): 0   Number of mixed cystic and solid nodules >/= 1.5 cm not described below (TR2): 0   _________________________________________________________   There is an approximately 0.9 x 0.9 x 0.7 cm isoechoic ill-defined nodule/pseudonodule within the posteroinferior aspect the right lobe of the thyroid (labeled 1), which does not meet criteria to recommend percutaneous sampling or continued dedicated follow-up.   _________________________________________________________   Nodule # 2:   Location: Left; Superior   Maximum size: 1.0 cm; Other 2 dimensions: 1.0 x 0.6 cm   Composition: solid/almost completely solid (2)   Echogenicity: hypoechoic (2)   Shape: not taller-than-wide (0)   Margins: smooth (0)   Echogenic foci: none (0)   ACR TI-RADS total points: 4.   ACR TI-RADS risk category: TR4 (4-6 points).   ACR TI-RADS recommendations:   *Given size (>/= 1 - 1.4 cm) and appearance, a follow-up ultrasound in 1 year should be considered based on TI-RADS criteria.   _________________________________________________________   Nodule # 3:   Location: Left; Inferior   Maximum size: 2.6 cm; Other 2 dimensions: 2.4 x 2.0 cm   Composition: solid/almost completely solid (2)   Echogenicity: hypoechoic (2)   Shape: not taller-than-wide (0)   Margins: smooth (0)   Echogenic foci: none (0)   ACR TI-RADS total points: 4.   ACR TI-RADS risk category: TR4 (4-6 points).   ACR TI-RADS recommendations:   **Given size (>/= 1.5 cm) and appearance, fine needle aspiration  of this moderately suspicious nodule should be considered based on TI-RADS criteria.   _________________________________________________________   IMPRESSION: 1. Findings suggestive of multinodular goiter. 2. Nodule #3 meets imaging criteria to recommend percutaneous sampling as indicated. 3. Nodule #2 meets imaging criteria to recommend a 1 year follow-up.   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  - We discussed that per ATA guidelines, diagnostic lobectomy is NOT necessary and a 12 month follow up is recommended, she is not comfortable at this time waiting that long and opted to revisit in 6 months - Reassurance provided at this time time   2. Cushingoid Features:  - Pt has phenotypical features of cushing syndrome and with her c/o weight gain for years I will proceed with 24-hr cortisol urinary excretion    F/U in 6 months  Signed electronically by: Mack Guise, MD  [facility]  [facility] [address] [place], [place] [address] Phone: [phone] FAX: [phone]   CC: Marin Olp, [place] [address] Phone: [phone] Fax: [phone]   Return to Endocrinology clinic as below: Future Appointments  Date Time Provider [place]  09/26/2021  2:20 PM Marin Olp, MD [facility]

## 2021-05-23 NOTE — Patient Instructions (Signed)

## 2021-05-25 ENCOUNTER — Encounter: Payer: Self-pay | Admitting: Internal Medicine

## 2021-05-26 ENCOUNTER — Other Ambulatory Visit (INDEPENDENT_AMBULATORY_CARE_PROVIDER_SITE_OTHER): Payer: Managed Care, Other (non HMO)

## 2021-05-26 ENCOUNTER — Other Ambulatory Visit: Payer: Self-pay

## 2021-05-26 DIAGNOSIS — R635 Abnormal weight gain: Secondary | ICD-10-CM

## 2021-05-29 LAB — CORTISOL, URINE, 24 HOUR
24 Hour urine volume (VMAHVA): 1500 mL
CREATININE, URINE: 1.51 g/(24.h) (ref 0.50–2.15)
Cortisol (Ur), Free: 7.4 mcg/24 h (ref 4.0–50.0)

## 2021-06-28 ENCOUNTER — Other Ambulatory Visit: Payer: Self-pay | Admitting: Family Medicine

## 2021-06-28 DIAGNOSIS — R748 Abnormal levels of other serum enzymes: Secondary | ICD-10-CM

## 2021-07-24 ENCOUNTER — Ambulatory Visit (INDEPENDENT_AMBULATORY_CARE_PROVIDER_SITE_OTHER): Payer: Managed Care, Other (non HMO) | Admitting: Family Medicine

## 2021-07-24 ENCOUNTER — Other Ambulatory Visit: Payer: Self-pay

## 2021-07-24 ENCOUNTER — Encounter: Payer: Self-pay | Admitting: Family Medicine

## 2021-07-24 VITALS — BP 114/78 | HR 83 | Temp 98.2°F | Ht 66.0 in | Wt 267.2 lb

## 2021-07-24 DIAGNOSIS — Z124 Encounter for screening for malignant neoplasm of cervix: Secondary | ICD-10-CM

## 2021-07-24 DIAGNOSIS — E1165 Type 2 diabetes mellitus with hyperglycemia: Secondary | ICD-10-CM

## 2021-07-24 DIAGNOSIS — E785 Hyperlipidemia, unspecified: Secondary | ICD-10-CM

## 2021-07-24 DIAGNOSIS — M25572 Pain in left ankle and joints of left foot: Secondary | ICD-10-CM

## 2021-07-24 DIAGNOSIS — E1169 Type 2 diabetes mellitus with other specified complication: Secondary | ICD-10-CM | POA: Diagnosis not present

## 2021-07-24 DIAGNOSIS — I10 Essential (primary) hypertension: Secondary | ICD-10-CM

## 2021-07-24 DIAGNOSIS — K219 Gastro-esophageal reflux disease without esophagitis: Secondary | ICD-10-CM

## 2021-07-24 MED ORDER — DICYCLOMINE HCL 10 MG PO CAPS: 10.0000 mg | ORAL_CAPSULE | Freq: Three times a day (TID) | ORAL | 3 refills | Status: DC

## 2021-07-24 MED ORDER — TELMISARTAN 40 MG PO TABS: 40.0000 mg | ORAL_TABLET | Freq: Every day | ORAL | 3 refills | Status: DC

## 2021-07-24 NOTE — Progress Notes (Signed)
Phone [phone] In person visit   Subjective:   Lisa [name] is a 60 y.o. year old very pleasant female patient who presents for/with See problem oriented charting Chief Complaint  Patient presents with   Hoarse    Patient states that she gets hoarse from time to time, no pain, but does have some soreness periodically. She states that it switches from side to side.     Foot Pain    And swelling, started Monday. Left foot.     This visit occurred during the SARS-CoV-2 public health emergency.  Safety protocols were in place, including screening questions prior to the visit, additional usage of staff PPE, and extensive cleaning of exam room while observing appropriate contact time as indicated for disinfecting solutions.   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[place]) 01/14/2017    Priority: High   Morbid obesity ([place]) 02/14/2016    Priority: High   Tobacco use disorder 08/12/2015    Priority: High   COPD (chronic obstructive pulmonary disease) ([place]) 01/18/2015    Priority: High   CKD (chronic kidney disease), stage III ([place]) 09/18/2020    Priority: Medium   Fatty liver 11/06/2019    Priority: Medium   Hyperlipidemia 04/12/2018    Priority: Medium   Hyperlipidemia associated with type 2 diabetes mellitus ([place]) 09/06/2017    Priority: Medium   Breast mass 08/03/2017    Priority: Medium   GERD (gastroesophageal reflux disease) 02/14/2016    Priority: Medium   DOE (dyspnea on exertion) 03/08/2014    Priority: Medium   Migraine without aura 07/26/2013    Priority: Medium   Sleep apnea 02/11/2012    Priority: Medium   Chest discomfort 10/12/2011    Priority: Medium    Hypertension associated with diabetes ([place]) 10/12/2011    Priority: Medium   Chronic hip pain 07/29/2017    Priority: Low   Left knee pain 05/15/2016    Priority: Low   Flank pain 04/21/2016    Priority: Low   Abdominal pain 02/28/2016    Priority: Low   Pulmonary nodule, right 04/11/2015    Priority: Low   Mediastinal lymphadenopathy 01/18/2015    Priority: Low   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 6 mL 3   ASPIRIN 81 PO Take 81 mg by mouth daily.     buPROPion (WELLBUTRIN SR) 150 MG 12 hr tablet Take 150 mg by mouth daily.     carvedilol (COREG) 25 MG tablet Take 1 tablet (25 mg total) by mouth 2 (two) times daily. 180 tablet 3   NEXLIZET 180-10 MG TABS TAKE 1 TABLET BY MOUTH EVERY DAY 90 tablet 3   ONE TOUCH LANCETS MISC Use to check blood sugars twice a day 200 each 0   pantoprazole (PROTONIX) 40 MG  tablet Take 1 tablet by mouth 30 mins before breakfast daily. 90 tablet 1   spironolactone (ALDACTONE) 25 MG tablet TAKE 1 TABLET BY MOUTH EVERY DAY 90 tablet 1   dicyclomine (BENTYL) 10 MG capsule Take 1 capsule (10 mg total) by mouth 3 (three) times daily before meals. As needed 270 capsule 3   telmisartan (MICARDIS) 40 MG tablet Take 1 tablet (40 mg total) by mouth daily. 90 tablet 3   No current facility-administered medications for this visit.     Objective:  BP 114/78   Pulse 83   Temp 98.2 F (36.8 C) (Temporal)   Ht '5\' 6"'$  (1.676 m)   Wt 267 lb 3.2 oz (121.2 kg)   SpO2 100%   BMI 43.13 kg/m  Gen: NAD, resting comfortably  MSK: very tender along lower portion  of achilles and into insertion point. Good range of motion in foot- no pain with palpation of the ankle otherwise or at insertion point of plantar fascia    Diabetic Foot Exam - Simple   Simple Foot Form Diabetic Foot exam was performed with the following findings: Yes 07/24/2021  9:28 AM  Visual Inspection No deformities, no ulcerations, no other skin breakdown bilaterally: Yes Sensation Testing Intact to touch and monofilament testing bilaterally: Yes Pulse Check Posterior Tibialis and Dorsalis pulse intact bilaterally: Yes Comments But see MSK comments.        Assessment and Plan   #medication compliance- doing meds every other day- we discussed working back towards daily.   # Left ankle pain S:was sitting at home and started noting pain in lateral portion of hindfoot on Monday and then moving around to back of heel. Hurts to a significant degree. Wearing a compression sleeve- does not help much. Took some motrin (knows to avoid with CKD) but did not help. No fall or injury.  Not red or warm. No fever or chills.   Does have history of gout that presented back in march- we had discussed possible gout back to 2018.  She is not sure if she ever took the allopurinol- leans to ward not having taken- uric acid as high as 8.8 with goal under 6. Did have some red meat a few weeks ago. Gout usually happens in toe. Tart cherry juice has typically helped- has not tried this time.  A/P: Patient with pain at the bottom of her Achilles tendon potential Achilles tendinopathy but also into the insertion point below that.  We will refer back to sports medicine.  Does not feel warm and is not red-I am not confident this is gout but she can restart her tart cherry juice which has helped in the past.  Also encouraged her to ice 20 minutes 3 times a day-she has not started this yet.  I will hold off on x-rays at this time as I think ultrasound may be more beneficial as a starting point.  #  GERD S:Medication: Compliant on pantoprazole 40 mg daily   - Patient saw cardiology back in April 2022 and EKG was reassuring- they thought there were possible esophageal spasms. She reported having some pain when eating sometimes. Sleeping on left side would cause discomfort and coughing- turning over was helpful. Reassuring cath in past. Compliant with aspirin, praluent and nexlizet - had a follow up with Dr. [name] PA back on may 3-patient with reported chest discomfort and sensation of slowly moving food down esophagus without pain-recommendation was for EGD versus barium swallow-patient preferred not to be sedated so  opted for barium swallow. -Also a GI visit they moved timing of PPI to 30 minutes before meals. She does this every other day because gets some leg cramps with daily.   She is getting some sensation of food slowly going down- water helps. Also hurts in throat near site of thyroid nodule.  A/P: GERD mildly poorly controlled but this could be because she doesn't tolerate the pantoprazole daily due to leg aches.  Could try pepcid on days in between pantoprazole to help with   #hypertension S: medication: Telmisartan 40 mg daily, Spironolactone 25 mg daily, Propanolol 10 mg as needed per cardiology, and carvedilol 25 mg twice daily Home readings #s: 119/73 last check BP Readings from Last 3 Encounters:  07/24/21 114/78  05/23/21 130/70  03/21/21 126/78  A/P: well controlled on last check- continue current meds  # Diabetes S: Medication:Ozempic 0.5 mg once weekly in past- was taken off in June when saw Dr. Kelton Pillar due to constipation/GI issues CBGs- blood sugar on Monday was >200 in afternoon. Had coke that [employer] Exercise and diet- weight up 4 lbs off ozempic. Wants to work on reducing portion sizes Lab Results  Component Value Date   HGBA1C 6.5 03/21/2021   HGBA1C 5.9 (H) 09/12/2020   HGBA1C 5.9 (A) 05/03/2020   A/P: hopefully stable- update a1c today. Continue withoutmeds  for now - significant improvement in constipation off of this- now regular  #hyperlipidemia S: Medication:Praluent 75 mg/mL injections every 2 weeks in past but off for months- is on nexlizet 180-10 mg daily Lab Results  Component Value Date   CHOL 104 06/19/2020   HDL 25 (L) 06/19/2020   LDLCALC 57 06/19/2020   TRIG 121 06/19/2020   CHOLHDL 4.2 06/19/2020   A/P: Unknown control off of Praluent-we will update lipid panel.  Continue nexlizet for now. And encouraged compliance. Hopefully not poorly controlled  #COPD- has not had to use albuterol recently. Overall stable- continue to monitor. Follows with Dr. Lamonte Sakai - sees next month and will get flu shot then.    #PAD- pain in legs with walking vascular was not thought to be related to this. Yearly follow up with vascular. Remain on aspirin and keep lipid control . Was seen in December 2021 with 1 year follow up from that note "patent superficial femoral arteries bilaterally with no significant narrowing and normal ABIs."  # Smoking- 1 PPD or 1.25 PPD. Wants to work on slowly cutting down.   Recommended follow up: reschedule October visit to 4 months from now Future Appointments  Date Time Provider [place]  08/08/2021  2:45 PM Collene Gobble, MD [facility] None  09/26/2021  2:20 PM Marin Olp, MD [facility]  11/14/2021  1:00 PM Shamleffer, Melanie Crazier, MD [facility] None    Lab/Order associations:   ICD-10-CM   1. Hyperlipidemia associated with type 2 diabetes mellitus (HCC)  E11.69 CBC with Differential/Platelet   E78.5 Comprehensive metabolic panel    Lipid panel    2. Essential hypertension  I10 CBC with Differential/Platelet    Comprehensive metabolic panel    Lipid panel    3. Type 2 diabetes mellitus with hyperglycemia, without long-term current use of insulin (HCC)  E11.65 CBC with Differential/Platelet    Comprehensive metabolic panel    Lipid panel    Hemoglobin A1c    4. Morbid obesity ([place])   E66.01     5. Gastroesophageal reflux disease, unspecified whether esophagitis present  K21.9     6. Screening for  cervical cancer  Z12.4 Ambulatory referral to Gynecology    7. Acute left ankle pain  M25.572 Ambulatory referral to Sports Medicine      Meds ordered this encounter  Medications   telmisartan (MICARDIS) 40 MG tablet    Sig: Take 1 tablet (40 mg total) by mouth daily.    Dispense:  90 tablet    Refill:  3   dicyclomine (BENTYL) 10 MG capsule    Sig: Take 1 capsule (10 mg total) by mouth 3 (three) times daily before meals. As needed    Dispense:  270 capsule    Refill:  3    I,Harris Phan,acting as a scribe for Garret Reddish, MD.,have documented all relevant documentation on the behalf of Garret Reddish, MD,as directed by  Garret Reddish, MD while in the presence of Garret Reddish, MD.  I, Garret Reddish, MD, have reviewed all documentation for this visit. The documentation on 07/24/21 for the exam, diagnosis, procedures, and orders are all accurate and complete.  Return precautions advised.  Garret Reddish, MD

## 2021-07-24 NOTE — Patient Instructions (Addendum)
Health Maintenance Due  Topic Date Due   PAP SMEAR-  We will call you within two weeks about your referral to Gynecology. If you do not hear within 2 weeks, give [name] a call.  05/24/2021   INFLUENZA VACCINE Not available in office yet , Patient will get this scheduled at her Pulmonologist- Dr. Lamonte Sakai - will give this when she goes back.   06/30/2021   OPHTHALMOLOGY EXAM Patient will get this scheduled.  07/17/2021   I am thrilled to hear the great news on your thyroid nodule follow-up being benign!  I recommend that you try to add some of your medications back in on a daily routine- please do this gradually to make sure you tolerate it  You have gained 4 lbs since your last visit - I want to encourage to you to try to lose this weight by continuing efforts with cutting out sugars/sodas, increasing your water intake, and try to get at least 150 minutes of exercise per week.  I am glad to hear that your bowel movements are improving off the ozempic.   I suggest you try placing ice on your feet - 3 times a day for 20 minutes.  [place] until you see Dr. [name].   We will call you within two weeks about your referral to Sports Medicine- Dr. [name]. If you do not hear within 2 weeks, give [name] a call.    You could try Pepcid for the days in between Pantoprazole for your gout as well.  I strongly encourage you to try to quit smoking.    Recommended follow up: Reschedule your October visit to 4 months from today. Cn see [name] sooner if needed

## 2021-07-25 NOTE — Progress Notes (Signed)
I, [name], LAT, ATC, am serving as scribe for Dr. [name].  Lisa [name] is a 60 y.o. female who presents to [place] [facility] today for L foot pain.  She was last seen by Dr. [name] on 02/25/21 for B knee pain and L trigger thumb.  Today, pt reports L foot pain and swelling since 07/21/21.  She locates her pain to L post calcaneus that initially began in her L lateral foot and then radiating into her L post ankle and heel.  Pt has a hx of gout.  Aggravating factors: walking; weight-bearing; L ankle DF/PF Treatments tried: compression; Motrin; tart cherry juice; ice; topical pain-relieving cream;   Pertinent review of systems: No fevers or chills  Relevant historical information: History of gout.  Heart disease.  Hypertension and diabetes.   Exam:  BP 134/78 (BP Location: Right Arm, Patient Position: Sitting, Cuff Size: Large)   Pulse 87   Ht '5\' 6"'$  (1.676 m)   Wt 267 lb 12.8 oz (121.5 kg)   SpO2 97%   BMI 43.22 kg/m  General: Well Developed, well nourished, and in no acute distress.   MSK: Left heel normal-appearing Tender palpation of posterior calcaneus.  Pain with resisted foot plantarflexion. Antalgic gait. Foot and ankle motion otherwise intact. Stable ligamentous exam.    Lab and Radiology Results  Diagnostic Limited MSK Ultrasound of: Left Achilles tendon Achilles tendon is intact. At insertion on calcaneus calcific change at distal tendon consistent with Haglund deformity. Calcaneal heel spur is present. No definitive Achilles tendon tear or retrocalcaneal bursitis is present. Impression: Haglund deformity and chronic calcific tendinopathy present.  Posterior calcaneal heel spur present.   X-ray images left foot obtained today personally and independently interpreted Plantar and posterior calcaneal heel spur is present. Calcific change distal tendon insertion consistent with Haglund deformity. Accessory ossicle lateral to  cuboid present. No acute fractures are present Await formal radiology review  Lab Results  Component Value Date   LABURIC 8.8 (H) 02/25/2021      Assessment and Plan: 60 y.o. female with left posterior heel pain occurring without injury.  Pain thought to be due to the chronic calcific tendinopathy likely exacerbation.  Doubtful for gout as the location and quality of pain is not typical for gout although she does have a history of gout and does have a history of elevated uric acid. Plan to treat with cam walker boot with heel lift, home eccentric exercises, and maximized oral medication over-the-counter and prescription strength doses that she can tolerate. Offered opiates patient declined for now. Recheck in 3 weeks.  Return sooner if needed. If worse in the interval would consider opiates and oral steroids. Also recommend a knee scooter and a crutch.  PDMP not reviewed this encounter. Orders Placed This Encounter  Procedures   [name] LIMITED JOINT SPACE STRUCTURES LOW LEFT(NO LINKED CHARGES)    Order Specific Question:   Reason for Exam (SYMPTOM  OR DIAGNOSIS REQUIRED)    Answer:   L heel pain    Order Specific Question:   Preferred imaging location?    Answer:   [facility]   DG Foot Complete Left    Standing Status:   Future    Number of Occurrences:   1    Standing Expiration Date:   07/28/2022    Order Specific Question:   Reason for Exam (SYMPTOM  OR DIAGNOSIS REQUIRED)    Answer:   eval left foot pain  Order Specific Question:   Is patient pregnant?    Answer:   No    Order Specific Question:   Preferred imaging location?    Answer:   [facility]   No orders of the defined types were placed in this encounter.    Discussed warning signs or symptoms. Please see discharge instructions. Patient expresses understanding.   The above documentation has been reviewed and is accurate and complete [name], M.D.

## 2021-07-28 ENCOUNTER — Ambulatory Visit (INDEPENDENT_AMBULATORY_CARE_PROVIDER_SITE_OTHER): Payer: Managed Care, Other (non HMO) | Admitting: Family Medicine

## 2021-07-28 ENCOUNTER — Ambulatory Visit: Payer: Self-pay

## 2021-07-28 ENCOUNTER — Other Ambulatory Visit: Payer: Self-pay

## 2021-07-28 ENCOUNTER — Encounter: Payer: Self-pay | Admitting: Family Medicine

## 2021-07-28 ENCOUNTER — Ambulatory Visit (INDEPENDENT_AMBULATORY_CARE_PROVIDER_SITE_OTHER): Payer: Managed Care, Other (non HMO)

## 2021-07-28 VITALS — BP 134/78 | HR 87 | Ht 66.0 in | Wt 267.8 lb

## 2021-07-28 DIAGNOSIS — M79672 Pain in left foot: Secondary | ICD-10-CM | POA: Diagnosis not present

## 2021-07-28 NOTE — Patient Instructions (Addendum)
Thank you for coming in today.   Use a cam walker boot.   Please go to [facility] supply to get the cam walker boot. we talked about today. You may also be able to get it from [employer].   You can also use a knee scooter or a crutch.   Do the exercises.   Please perform the exercise program that we have prepared for you and gone over in detail on a daily basis.  In addition to the handout you were provided you can access your program through: [email or web address]   Your unique program code is:   [email or web address]  Please get an Xray today before you leave   Recheck in 3 weeks.

## 2021-07-29 ENCOUNTER — Encounter: Payer: Self-pay | Admitting: Family Medicine

## 2021-07-29 NOTE — Progress Notes (Signed)
X-ray left foot shows heel spur and calcific changes at the Achilles tendon insertion site on the heel.  Otherwise the foot x-ray looks normal.  This is similar to what we saw on ultrasound.

## 2021-07-30 NOTE — Telephone Encounter (Signed)
You are not responding as I would expect to this treatment. I think we should see each other sooner than 3 weeks.  Recommend schedule with me midweek next week.  I do think getting crutches or a knee scooter is a good idea to take the weight off of your foot.  My next step is to put you into basically a cast but that is pretty extreme.  If you need to be seen urgently I am pretty sure I could get you in tomorrow or Friday as could my partner Dr. Glennon Mac.

## 2021-08-05 NOTE — Progress Notes (Signed)
   I, [name], LAT, ATC, am serving as scribe for Dr. [name].  Lisa [name] is a 60 y.o. female who presents to [place] at [facility] today for f/u of L post calcaneus pain that began on 07/21/21.  She was last seen by Dr. [name] on 07/28/21 and was placed into a Cam walker boot w/ a heel lift and was shown a HEP focusing on seated gastroc eccentric exercises.  Today, pt reports no change in her L post calcaneus pain.  She states that it tends to feel worse at night.  It con't to hurt and sting.  She has been wearing her L ankle walking boot and has been walking w a single crutch on her R side.  She has tried Tylenol and Motrin but those did not help.  She tried some IBU '800mg'$  of her son's and that did help a bit.  Diagnostic imaging: L foot XR- 07/28/21   Pertinent review of systems: No fevers or chills  Relevant historical information: Hypertension and diabetes   Exam:  BP 124/80 (BP Location: Right Arm, Patient Position: Sitting, Cuff Size: Large)   Pulse 82   Ht '5\' 6"'$  (1.676 m)   Wt 267 lb 12.8 oz (121.5 kg)   SpO2 96%   BMI 43.22 kg/m  General: Well Developed, well nourished, and in no acute distress.   MSK: Left calcaneus normal-appearing Tender palpation at Achilles tendon insertion.  Pain with resisted foot plantarflexion.    Lab and Radiology Results DG Foot Complete Left  Result Date: 07/28/2021 CLINICAL DATA:  Lateral left heel pain for a week when standing. EXAM: LEFT FOOT - COMPLETE 3+ VIEW COMPARISON:  None. FINDINGS: Enthesopathic changes at the posterior calcaneus. Small calcaneal plantar spur. No fracture or dislocation. No other abnormalities. IMPRESSION: Enthesopathic changes at the posterior calcaneus at the Achilles insertion site. Small calcaneal plantar spur. No other abnormalities. Electronically Signed   By: Dorise Bullion III M.D.   On: 07/28/2021 16:14   I, [name], personally (independently) visualized and performed the  interpretation of the images attached in this note.     Assessment and Plan: 60 y.o. female with Achilles tendinitis at insertion associate with calcific change.  No evidence of tear on prior ultrasound.  Patient is improving a bit with immobilization with Cam walker boot and limited weightbearing with crutch.  Plan to continue CAM Walker boot for another few weeks.  We will start nitroglycerin patch protocol and proceed with home exercise is much as reasonable based on pain.  Recheck as scheduled on September 19.  Goal to transition to more normal weightbearing and heavier duty exercises if possible. If worsening next step would probably be MRI.   PDMP not reviewed this encounter. No orders of the defined types were placed in this encounter.  Meds ordered this encounter  Medications   nitroGLYCERIN (NITRODUR - DOSED IN MG/24 HR) 0.2 mg/hr patch    Sig: Apply 1/4 patch daily to tendon for tendonitis.    Dispense:  30 patch    Refill:  1     Discussed warning signs or symptoms. Please see discharge instructions. Patient expresses understanding.   The above documentation has been reviewed and is accurate and complete [name], M.D.

## 2021-08-06 ENCOUNTER — Other Ambulatory Visit: Payer: Self-pay

## 2021-08-06 ENCOUNTER — Encounter: Payer: Self-pay | Admitting: Family Medicine

## 2021-08-06 ENCOUNTER — Ambulatory Visit (INDEPENDENT_AMBULATORY_CARE_PROVIDER_SITE_OTHER): Payer: Managed Care, Other (non HMO) | Admitting: Family Medicine

## 2021-08-06 VITALS — BP 124/80 | HR 82 | Ht 66.0 in | Wt 267.8 lb

## 2021-08-06 DIAGNOSIS — M7662 Achilles tendinitis, left leg: Secondary | ICD-10-CM | POA: Diagnosis not present

## 2021-08-06 MED ORDER — NITROGLYCERIN 0.2 MG/HR TD PT24: MEDICATED_PATCH | TRANSDERMAL | 1 refills | Status: DC

## 2021-08-06 NOTE — Patient Instructions (Addendum)
Thank you for coming in today.   Nitroglycerin Protocol  Apply 1/4 nitroglycerin patch to affected area daily. Change position of patch within the affected area every 24 hours. You may experience a headache during the first 1-2 weeks of using the patch, these should subside. If you experience headaches after beginning nitroglycerin patch treatment, you may take your preferred over the counter pain reliever. Another side effect of the nitroglycerin patch is skin irritation or rash related to patch adhesive. Please notify our office if you develop more severe headaches or rash, and stop the patch. Tendon healing with nitroglycerin patch may require 12 to 24 weeks depending on the extent of injury. Men should not use if taking Viagra, Cialis, or Levitra.  Do not use if you have migraines or rosacea.     Use the cam walker boot for a few weeks.     Original EVENupT Shoe Balancer/Leveler - Equalize Limb Length and Reduce Body Strain While Walking    Return as scheduled on the 19th.

## 2021-08-08 ENCOUNTER — Ambulatory Visit: Payer: Managed Care, Other (non HMO) | Admitting: Emergency Medicine

## 2021-08-11 ENCOUNTER — Telehealth: Payer: Self-pay | Admitting: *Deleted

## 2021-08-11 NOTE — Telephone Encounter (Signed)
Called and spoke with patient, she states she is still wearing the CPAP machine, but needs a new one.  She says it does not even ramp up anymore, it stays on one setting.  I let her know that I was unable to pull a compliance report and asked that she bring her SD card in or the machine to see if we can pull the data. She said she would.   She currently uses [employer], but would like to change companies.  Advised her that we can discuss during her f/u appointment on 9/13.  Nothing further needed.

## 2021-08-12 ENCOUNTER — Other Ambulatory Visit: Payer: Self-pay

## 2021-08-12 ENCOUNTER — Ambulatory Visit (INDEPENDENT_AMBULATORY_CARE_PROVIDER_SITE_OTHER): Payer: Managed Care, Other (non HMO) | Admitting: Adult Health

## 2021-08-12 ENCOUNTER — Encounter: Payer: Self-pay | Admitting: Adult Health

## 2021-08-12 VITALS — BP 128/84 | HR 74 | Temp 97.3°F | Ht 65.75 in | Wt 267.4 lb

## 2021-08-12 DIAGNOSIS — G4730 Sleep apnea, unspecified: Secondary | ICD-10-CM | POA: Diagnosis not present

## 2021-08-12 DIAGNOSIS — J449 Chronic obstructive pulmonary disease, unspecified: Secondary | ICD-10-CM

## 2021-08-12 DIAGNOSIS — F17200 Nicotine dependence, unspecified, uncomplicated: Secondary | ICD-10-CM | POA: Diagnosis not present

## 2021-08-12 NOTE — Progress Notes (Signed)
$'@Patient'S$  ID: Lisa [name], female    DOB: 1961/04/11, 60 y.o.   MRN: [id number]  Chief Complaint  Patient presents with   Follow-up    Referring provider: Marin Olp, MD  HPI: 60 year old female smoker followed for RB-ILD and COPD.  Sleep apnea on CPAP  TEST/EVENTS :  CT chest October 31, 2020 showed patchy groundglass micro nodularity in both lungs with upper lobe predominance.  Findings compatible with RB ILD.  2D echo March 13, 2021 EF 6065%, moderate to severe asymmetric hypertrophy of the basal septal segment, moderate LVH, grade 2 diastolic dysfunction  PFTs February 14, 2014 showed moderate restriction with FEV1 at 73%, ratio 93, FVC not 63%, no significant bronchodilator response.Marland Kitchen DLCO 55%   08/12/2021 Follow up : COPD , RB-ILD , OSA  Patient presents for a follow-up visit.  Last seen October 2021.  Patient has underlying COPD and RB ILD.  She has ongoing smoking.  Smoking cessation was discussed. She uses albuterol only as needed.  Rarely ever has to use it.  Says that overall her breathing is been doing okay she denies any cough flare of cough or wheezing.  We discussed getting a flu shot today. CT chest in October 31, 2020 showed stable patchy groundglass micro nodularity compatible with RB ILD.  We discussed the low-dose CT screening program.   We discussed smoking cessation.  Patient does have sleep apnea is on CPAP at bedtime.  She says she wears her CPAP every single night.  CPAP download was requested.  She feels rested with no significant daytime sleepiness. Wears CPAP 6-7hrs each night . Needs new machine, her is getting old. No longer can get download . Feels rested . Can not sleep without her machine .  Uses full face mask     Allergies  Allergen Reactions   Almond Oil Anaphylaxis and Nausea And Vomiting    Almond-Nuts Only   Apple Anaphylaxis   Ceftin [Cefuroxime] Other (See Comments)    Severe stomach pain - Diverticulitis flares up   Cherry  Extract Anaphylaxis    Cherry fruit only    Other Anaphylaxis and Itching    Most fruits- Are NOT tolerated (PLEASE ASK PATIENT BEFORE GIVING, AS CERTAIN FRUITS ARE TOLERATED IN LIMITED QUANTITIES!!)   Guaifenesin & Derivatives Other (See Comments)    Restlessness, jittery   Hctz [Hydrochlorothiazide] Other (See Comments)    Causes a headache   Naproxen Other (See Comments)    Insomnia   Lisinopril Cough         Immunization History  Administered Date(s) Administered   Influenza Split 12/31/2013, 08/30/2014   Influenza,inj,Quad PF,6+ Mos 08/12/2015, 08/21/2016, 09/06/2017, 09/14/2018, 09/01/2019, 09/10/2020, 08/12/2021   PFIZER(Purple Top)SARS-COV-2 Vaccination 02/22/2020, 03/18/2020, 10/07/2020   PPD Test 03/04/2012   Pneumococcal Polysaccharide-23 06/16/2012   Tdap 03/04/2012    Past Medical History:  Diagnosis Date   Allergic rhinitis    Arthritis    Asthma    Breast nodule    right breast, to see dr Helane Rima 06-20-2012 for    Chest pain, non-cardiac    Chronic headaches    Cigarette smoker    Complication of anesthesia 05-17-2012   trouble breathing after colonscopy, needed nebulizer   COPD (chronic obstructive pulmonary disease) ([place])    Diastolic congestive heart failure ([place]) 04/17/2019   Diverticulosis    GERD (gastroesophageal reflux disease)    Hepatic hemangioma    Hypertension    IBS (irritable bowel syndrome)    Nuclear stress test  Myoview 05/2019: EF 63, no ischemia. Low Risk   Obesity    Prosthetic eye globe    Pyloric erosion    Sleep apnea    CPAP setting varies from 4-10   Type 2 diabetes mellitus ([place]) 03/11/2017    Tobacco History: Social History   Tobacco Use  Smoking Status Every Day   Packs/day: 1.50   Years: 38.00   Pack years: 57.00   Types: Cigarettes  Smokeless Tobacco Never  Tobacco Comments   10 cigarettes daily ARJ 09/10/2020   Ready to quit: No Counseling given: Yes Tobacco comments: 10 cigarettes daily ARJ  09/10/2020   Outpatient Medications Prior to Visit  Medication Sig Dispense Refill   Alirocumab (PRALUENT) 75 MG/ML SOAJ Inject 1 pen into the skin every 14 (fourteen) days. 6 mL 3   ASPIRIN 81 PO Take 81 mg by mouth daily.     buPROPion (WELLBUTRIN SR) 150 MG 12 hr tablet Take 150 mg by mouth daily.     carvedilol (COREG) 25 MG tablet Take 1 tablet (25 mg total) by mouth 2 (two) times daily. 180 tablet 3   dicyclomine (BENTYL) 10 MG capsule Take 1 capsule (10 mg total) by mouth 3 (three) times daily before meals. As needed 270 capsule 3   NEXLIZET 180-10 MG TABS TAKE 1 TABLET BY MOUTH EVERY DAY 90 tablet 3   nitroGLYCERIN (NITRODUR - DOSED IN MG/24 HR) 0.2 mg/hr patch Apply 1/4 patch daily to tendon for tendonitis. 30 patch 1   ONE TOUCH LANCETS MISC Use to check blood sugars twice a day 200 each 0   pantoprazole (PROTONIX) 40 MG tablet Take 1 tablet by mouth 30 mins before breakfast daily. 90 tablet 1   spironolactone (ALDACTONE) 25 MG tablet TAKE 1 TABLET BY MOUTH EVERY DAY 90 tablet 1   telmisartan (MICARDIS) 40 MG tablet Take 1 tablet (40 mg total) by mouth daily. 90 tablet 3   No facility-administered medications prior to visit.     Review of Systems:   Constitutional:   No  weight loss, night sweats,  Fevers, chills,  +fatigue, or  lassitude.  HEENT:   No headaches,  Difficulty swallowing,  Tooth/dental problems, or  Sore throat,                No sneezing, itching, ear ache, nasal congestion, post nasal drip,   CV:  No chest pain,  Orthopnea, PND, swelling in lower extremities, anasarca, dizziness, palpitations, syncope.   GI  No heartburn, indigestion, abdominal pain, nausea, vomiting, diarrhea, change in bowel habits, loss of appetite, bloody stools.   Resp: .  No chest wall deformity  Skin: no rash or lesions.  GU: no dysuria, change in color of urine, no urgency or frequency.  No flank pain, no hematuria   MS:  No joint pain or swelling.  No decreased range of  motion.  No back pain.    Physical Exam  BP 128/84 (BP Location: Left Wrist, Cuff Size: Normal)   Pulse 74   Temp (!) 97.3 F (36.3 C) (Oral)   Ht 5' 5.75" (1.67 m)   Wt 267 lb 6.4 oz (121.3 kg)   SpO2 100%   BMI 43.49 kg/m   GEN: A/Ox3; pleasant , NAD, well nourished    HEENT:  [place]/AT,   NOSE-clear, THROAT-clear, no lesions, no postnasal drip or exudate noted.   NECK:  Supple w/ fair ROM; no JVD; normal carotid impulses w/o bruits; no thyromegaly or nodules palpated; no lymphadenopathy.  RESP  Clear  P & A; w/o, wheezes/ rales/ or rhonchi. no accessory muscle use, no dullness to percussion  CARD:  RRR, no m/r/g, yt peripheral edema, pulses intact, no cyanosis or clubbing.  GI:   Soft & nt; nml bowel sounds; no organomegaly or masses detected.   Musco: Warm bil, no deformities or joint swelling noted.   Neuro: alert, no focal deficits noted.    Skin: Warm, no lesions or rashes    Lab Results:  CBC    Component Value Date/Time   WBC 7.9 02/14/2021 1503   WBC 8.8 09/12/2020 1551   RBC 4.94 02/14/2021 1503   RBC 4.84 09/12/2020 1551   HGB 15.1 02/14/2021 1503   HCT 45.4 02/14/2021 1503   PLT 317 02/14/2021 1503   MCV 92 02/14/2021 1503   MCH 30.6 02/14/2021 1503   MCH 30.2 09/12/2020 1551   MCHC 33.3 02/14/2021 1503   MCHC 33.1 09/12/2020 1551   RDW 12.4 02/14/2021 1503   LYMPHSABS 2,869 09/12/2020 1551   LYMPHSABS 2.5 04/22/2017 1145   MONOABS 0.5 06/23/2017 1221   EOSABS 132 09/12/2020 1551   EOSABS 0.1 04/22/2017 1145   BASOSABS 26 09/12/2020 1551   BASOSABS 0.0 04/22/2017 1145    BMET    PFT Results Latest Ref Rng & Units 02/14/2014  FVC-Pre L 2.01  FVC-Predicted Pre % 63  FVC-Post L 2.00  FVC-Predicted Post % 63  Pre FEV1/FVC % % 94  Post FEV1/FCV % % 93  FEV1-Pre L 1.88  FEV1-Predicted Pre % 73  FEV1-Post L 1.86  DLCO uncorrected ml/min/mmHg 15.24  DLCO UNC% % 55  DLCO corrected ml/min/mmHg 15.24  DLCO COR %Predicted % 55  DLVA  Predicted % 99  TLC L 3.06  TLC % Predicted % 56  RV % Predicted % 41    No results found for: NITRICOXIDE      Assessment & Plan:   COPD (chronic obstructive pulmonary disease) (HCC) Currently stable.  Check PFTs on return.  Smoking cessation discussed.  Albuterol as needed  Plan  Patient Instructions  Continue on CPAP At bedtime  .  Work on healthy weight .  Do not drive if sleepy .  Order for new CPAP .   Refer to LDCT chest screening program.   Work on not smoking .  Albuterol inhaler ,As needed    Flu shot today .   Follow up with Dr. Lamonte Sakai  in 6 months with PFTs  and As needed            Sleep apnea Obstructive sleep apnea with excellent CPAP uses.  Order for new CPAP ordered.  Plan Patient Instructions  Continue on CPAP At bedtime  .  Work on healthy weight .  Do not drive if sleepy .  Order for new CPAP .   Refer to LDCT chest screening program.   Work on not smoking .  Albuterol inhaler ,As needed    Flu shot today .   Follow up with Dr. Lamonte Sakai  in 6 months with PFTs  and As needed              Rexene Edison, NP 08/12/2021

## 2021-08-12 NOTE — Assessment & Plan Note (Signed)
Currently stable.  Check PFTs on return.  Smoking cessation discussed.  Albuterol as needed  Plan  Patient Instructions  Continue on CPAP At bedtime  .  Work on healthy weight .  Do not drive if sleepy .  Order for new CPAP .   Refer to LDCT chest screening program.   Work on not smoking .  Albuterol inhaler ,As needed    Flu shot today .   Follow up with Dr. Lamonte Sakai  in 6 months with PFTs  and As needed

## 2021-08-12 NOTE — Patient Instructions (Addendum)
Continue on CPAP At bedtime  .  Work on healthy weight .  Do not drive if sleepy .  Order for new CPAP .   Refer to LDCT chest screening program.   Work on not smoking .  Albuterol inhaler ,As needed    Flu shot today .   Follow up with Dr. Lamonte Sakai  in 6 months with PFTs  and As needed

## 2021-08-12 NOTE — Assessment & Plan Note (Signed)
Obstructive sleep apnea with excellent CPAP uses.  Order for new CPAP ordered.  Plan Patient Instructions  Continue on CPAP At bedtime  .  Work on healthy weight .  Do not drive if sleepy .  Order for new CPAP .   Refer to LDCT chest screening program.   Work on not smoking .  Albuterol inhaler ,As needed    Flu shot today .   Follow up with Dr. Lamonte Sakai  in 6 months with PFTs  and As needed

## 2021-08-18 ENCOUNTER — Other Ambulatory Visit: Payer: Self-pay

## 2021-08-18 ENCOUNTER — Encounter: Payer: Self-pay | Admitting: Family Medicine

## 2021-08-18 ENCOUNTER — Ambulatory Visit (INDEPENDENT_AMBULATORY_CARE_PROVIDER_SITE_OTHER): Payer: Managed Care, Other (non HMO) | Admitting: Family Medicine

## 2021-08-18 VITALS — BP 128/78 | HR 85 | Ht 65.75 in | Wt 267.6 lb

## 2021-08-18 DIAGNOSIS — M7662 Achilles tendinitis, left leg: Secondary | ICD-10-CM

## 2021-08-18 DIAGNOSIS — S90415A Abrasion, left lesser toe(s), initial encounter: Secondary | ICD-10-CM | POA: Diagnosis not present

## 2021-08-18 MED ORDER — DOXYCYCLINE HYCLATE 100 MG PO TABS: 100.0000 mg | ORAL_TABLET | Freq: Two times a day (BID) | ORAL | 0 refills | Status: AC

## 2021-08-18 NOTE — Progress Notes (Signed)
   I, [name], LAT, ATC, am serving as scribe for Dr. [name].  Lisa [name] is a 60 y.o. female who presents to [place] at [facility] today for f/u of L Achilles tendinitis at insertion associated with calcific change. Pt was last seen by Dr. [name] on 08/06/21 and was advised to cont CAM walker boot, start nitroglycerin patch protocol, and HEP. Today, pt reports that she's feeling much better, rating her improvement at 97-98%.  She is no longer using the nitroglycerin patches and she is no longer wearing her CAM walker boot.     Scraped left 3rd toe a day or 2 ago when she opened a door and it scraped the top of her foot.  She notes this is a little painful.  It stopped bleeding and is improving but she is worried it may become infected.  Dx imaging: 07/28/21 L foot XR  Pertinent review of systems: No fevers or chills  Relevant historical information: Diabetes   Exam:  BP 128/78 (BP Location: Right Arm, Patient Position: Sitting, Cuff Size: Large)   Pulse 85   Ht 5' 5.75" (1.67 m)   Wt 267 lb 9.6 oz (121.4 kg)   SpO2 97%   BMI 43.52 kg/m  General: Well Developed, well nourished, and in no acute distress.   MSK: Left foot slight abrasion left third toe otherwise normal.  Normal foot and ankle motion.  No pain with resisted foot plantarflexion.        Assessment and Plan: 60 y.o. female with left foot Achilles tendinitis improved spontaneously with a little bit of home exercise.  She is also used a little nitroglycerin patch protocol.  Recommend continued mild conservative management strategies.  She seems to be doing quite well with not a lot.  Certainly could increase her strategy if needed including more aggressive home exercise program and using the nitroglycerin patch protocol more reliably.  However what is working right now is effective it seems.  Watchful waiting for now.  As for the toe abrasion I think she will improve on her own.  However I  am a little worried that she may develop an infection.  Prescribe doxycycline with instructions and when to use it.   PDMP not reviewed this encounter. No orders of the defined types were placed in this encounter.  Meds ordered this encounter  Medications   doxycycline (VIBRA-TABS) 100 MG tablet    Sig: Take 1 tablet (100 mg total) by mouth 2 (two) times daily for 7 days.    Dispense:  14 tablet    Refill:  0     Discussed warning signs or symptoms. Please see discharge instructions. Patient expresses understanding.   The above documentation has been reviewed and is accurate and complete [name], M.D.

## 2021-08-18 NOTE — Patient Instructions (Signed)
Thank you for coming in today.   If the toe wound does not improve start the doxycycline.   Ok to soak the toe.   If the heel start to get worse restart the exercises and nitro patches.    Recheck as needed.

## 2021-08-24 ENCOUNTER — Other Ambulatory Visit: Payer: Self-pay | Admitting: Cardiovascular Disease

## 2021-09-08 ENCOUNTER — Ambulatory Visit (INDEPENDENT_AMBULATORY_CARE_PROVIDER_SITE_OTHER): Payer: Managed Care, Other (non HMO) | Admitting: Family Medicine

## 2021-09-08 ENCOUNTER — Telehealth: Payer: Self-pay

## 2021-09-08 ENCOUNTER — Other Ambulatory Visit: Payer: Self-pay

## 2021-09-08 ENCOUNTER — Encounter: Payer: Self-pay | Admitting: Family Medicine

## 2021-09-08 ENCOUNTER — Telehealth: Payer: Self-pay | Admitting: Nurse Practitioner

## 2021-09-08 VITALS — BP 126/68 | HR 87 | Temp 97.9°F | Ht 66.0 in | Wt 262.8 lb

## 2021-09-08 DIAGNOSIS — R102 Pelvic and perineal pain: Secondary | ICD-10-CM

## 2021-09-08 DIAGNOSIS — E1165 Type 2 diabetes mellitus with hyperglycemia: Secondary | ICD-10-CM | POA: Diagnosis not present

## 2021-09-08 DIAGNOSIS — K5792 Diverticulitis of intestine, part unspecified, without perforation or abscess without bleeding: Secondary | ICD-10-CM | POA: Diagnosis not present

## 2021-09-08 LAB — COMPREHENSIVE METABOLIC PANEL
ALT: 23 U/L (ref 0–35)
AST: 18 U/L (ref 0–37)
Albumin: 4.1 g/dL (ref 3.5–5.2)
Alkaline Phosphatase: 90 U/L (ref 39–117)
BUN: 34 mg/dL — ABNORMAL HIGH (ref 6–23)
CO2: 25 mEq/L (ref 19–32)
Calcium: 10.1 mg/dL (ref 8.4–10.5)
Chloride: 101 mEq/L (ref 96–112)
Creatinine, Ser: 1.49 mg/dL — ABNORMAL HIGH (ref 0.40–1.20)
GFR: 38.62 mL/min — ABNORMAL LOW (ref >60.00)
Glucose, Bld: 321 mg/dL — ABNORMAL HIGH (ref 70–99)
Potassium: 4.2 mEq/L (ref 3.5–5.1)
Sodium: 134 mEq/L — ABNORMAL LOW (ref 135–145)
Total Bilirubin: 0.3 mg/dL (ref 0.2–1.2)
Total Protein: 7.9 g/dL (ref 6.0–8.3)

## 2021-09-08 LAB — POC URINALSYSI DIPSTICK (AUTOMATED)
Bilirubin, UA: NEGATIVE
Blood, UA: POSITIVE
Glucose, UA: POSITIVE — AB
Ketones, UA: NEGATIVE
Leukocytes, UA: NEGATIVE
Nitrite, UA: NEGATIVE
Protein, UA: NEGATIVE
Spec Grav, UA: 1.02 (ref 1.010–1.025)
Urobilinogen, UA: 0.2 E.U./dL
pH, UA: 6 (ref 5.0–8.0)

## 2021-09-08 LAB — LIPID PANEL
Cholesterol: 147 mg/dL (ref 0–200)
HDL: 23.5 mg/dL — ABNORMAL LOW (ref >39.00)
NonHDL: 123.18
Total CHOL/HDL Ratio: 6
Triglycerides: 392 mg/dL — ABNORMAL HIGH (ref 0.0–149.0)
VLDL: 78.4 mg/dL — ABNORMAL HIGH (ref 0.0–40.0)

## 2021-09-08 LAB — CBC WITH DIFFERENTIAL/PLATELET
Basophils Absolute: 0 10*3/uL (ref 0.0–0.1)
Basophils Relative: 0.6 % (ref 0.0–3.0)
Eosinophils Absolute: 0.1 10*3/uL (ref 0.0–0.7)
Eosinophils Relative: 1.1 % (ref 0.0–5.0)
HCT: 44.7 % (ref 36.0–46.0)
Hemoglobin: 14.9 g/dL (ref 12.0–15.0)
Lymphocytes Relative: 25.7 % (ref 12.0–46.0)
Lymphs Abs: 2.1 10*3/uL (ref 0.7–4.0)
MCHC: 33.4 g/dL (ref 30.0–36.0)
MCV: 90.2 fl (ref 78.0–100.0)
Monocytes Absolute: 0.6 10*3/uL (ref 0.1–1.0)
Monocytes Relative: 7.4 % (ref 3.0–12.0)
Neutro Abs: 5.3 10*3/uL (ref 1.4–7.7)
Neutrophils Relative %: 65.2 % (ref 43.0–77.0)
Platelets: 268 10*3/uL (ref 150.0–400.0)
RBC: 4.96 Mil/uL (ref 3.87–5.11)
RDW: 12.9 % (ref 11.5–15.5)
WBC: 8.1 10*3/uL (ref 4.0–10.5)

## 2021-09-08 LAB — HEMOGLOBIN A1C: Hgb A1c MFr Bld: 9.5 % — ABNORMAL HIGH (ref 4.6–6.5)

## 2021-09-08 LAB — LDL CHOLESTEROL, DIRECT: Direct LDL: 89 mg/dL

## 2021-09-08 NOTE — Progress Notes (Signed)
Phone [phone] In person visit   Subjective:   Lisa [name] is a 60 y.o. year old very pleasant female patient who presents for/with See problem oriented charting Chief Complaint  Patient presents with   Diverticulitis    Started last Monday. Patient states that her pain is 5/10 it comes in waves.    This visit occurred during the SARS-CoV-2 public health emergency.  Safety protocols were in place, including screening questions prior to the visit, additional usage of staff PPE, and extensive cleaning of exam room while observing appropriate contact time as indicated for disinfecting solutions.    Past Medical History-  Patient Active Problem List   Diagnosis Date Noted   Diastolic congestive heart failure ([place]) 04/17/2019    Priority: 1.   Type 2 diabetes mellitus ([place]) 03/11/2017    Priority: 1.   PAD (peripheral artery disease) ([place]) 01/19/2017    Priority: 1.   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   Morbid obesity ([place]) 02/14/2016    Priority: 1.   Tobacco use disorder 08/12/2015    Priority: 1.   COPD (chronic obstructive pulmonary disease) ([place]) 01/18/2015    Priority: 1.   CKD (chronic kidney disease), stage III ([place]) 09/18/2020    Priority: 2.   Fatty liver 11/06/2019    Priority: 2.   Hyperlipidemia 04/12/2018    Priority: 2.   Hyperlipidemia associated with type 2 diabetes mellitus ([place]) 09/06/2017    Priority: 2.   Breast mass 08/03/2017    Priority: 2.   GERD (gastroesophageal reflux disease) 02/14/2016    Priority: 2.   DOE (dyspnea on exertion) 03/08/2014    Priority: 2.   Migraine without aura 07/26/2013    Priority: 2.   Sleep apnea 02/11/2012    Priority: 2.   Chest discomfort 10/12/2011    Priority: 2.   Hypertension associated with diabetes ([place]) 10/12/2011    Priority: 2.   Chronic hip pain 07/29/2017    Priority: 3.   Left knee pain 05/15/2016    Priority: 3.   Flank pain  04/21/2016    Priority: 3.   Abdominal pain 02/28/2016    Priority: 3.   Pulmonary nodule, right 04/11/2015    Priority: 3.   Mediastinal lymphadenopathy 01/18/2015    Priority: 3.   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     buPROPion (WELLBUTRIN SR) 150 MG 12 hr tablet Take 150 mg by mouth daily.     carvedilol (COREG) 25 MG tablet Take 1 tablet (25 mg total) by mouth 2 (two) times daily. 180 tablet 3   dicyclomine (BENTYL) 10 MG capsule Take 1 capsule (10 mg total) by mouth 3 (three) times daily before meals. As needed 270 capsule 3   NEXLIZET 180-10 MG TABS TAKE 1 TABLET BY MOUTH EVERY DAY 90 tablet 3   ONE TOUCH LANCETS MISC Use to check blood sugars twice a day 200 each 0   pantoprazole (PROTONIX) 40 MG tablet Take 1 tablet by mouth 30 mins before breakfast daily. 90 tablet 1   PRALUENT 75 MG/ML SOAJ INJECT  1 PEN INTO THE SKIN EVERY 14 (FOURTEEN) DAYS. (Patient not taking: Reported on 09/08/2021) 6 mL 3   telmisartan (MICARDIS) 40 MG tablet Take 1 tablet (40 mg total) by mouth daily. 90 tablet 3   nitroGLYCERIN (NITRODUR - DOSED IN MG/24 HR) 0.2 mg/hr patch Apply 1/4 patch daily to tendon for tendonitis. (Patient not taking: No sig reported) 30 patch 1   spironolactone (ALDACTONE) 25 MG tablet TAKE 1 TABLET BY MOUTH EVERY DAY (Patient not taking: Reported on 09/08/2021) 90 tablet 1   No current facility-administered medications for this visit.     Objective:  BP 126/68   Pulse 87   Temp 97.9 F (36.6 C) (Temporal)   Ht 5\' 6"  (1.676 m)   Wt 262 lb 12.8 oz (119.2 kg)   SpO2 97%   BMI 42.42 kg/m  Gen: NAD, resting  comfortably CV: RRR no murmurs rubs or gallops Lungs: CTAB no crackles, wheeze, rhonchi Abdomen: Diffuse abdominal pain worse in suprapubic and right lower quadrant.  No rebounding or guarding. Ext: Trace edema Skin: warm, dry     Assessment and Plan   #Diverticulitis concern- LLQ and suprapubic pain S:last Wednesday started with suprapubic pain. Thought it could be diverticulitis. Did miralax to help with BM and bentyl to help with cramping. Took some doxycycline to see if that would help. Wanted to stay out of the office. Upped fiber intake.   She has continued to have pain that comes in waives at times up to 8/10 but oscillates mainly between 3-8, at times down to 0 . At higher pain levels gets radiated pain to buttocks. Some black specks as stools and now white specks  No burning with peeing. Polyuria noted. Nocturia noted. History of hematuria followed by urology Dr. Risa Grill in past  Last imaging confirmed diverticulitis wasa mild acute sigmoid in [id number] A/P: 60 year old female with history of diverticulitis presents with 6 days of suprapubic pain radiating in the right lower quadrant-still has her appendix and is very tender on exam-we need to rule out diverticulitis and appendicitis-stat CT was ordered.  Stat labs were ordered.  Discussed sending her to the emergency room which she declines at this time.  This would allow for quicker work-up.  We will change her to a liquid diet per current recommendations-for uncomplicated acute diverticulitis per up-to-date current consensus is to not treat with antibiotics but patient is potentially higher risk with morbid obesity, diabetes, COPD, PAD, CKD stage III and I am going to think this over once I get back her CT scan.  Recommended scheduling Tylenol 500 mg every 6 hours or if she takes 2 at a time to take max 3 times a day ideally separated by 8 hours-could also alternatively take a Tylenol arthritis.  She has been using some ibuprofen 800 mg 9  concern about her CKD stage III-also would be willing to consider tramadol  Also will get urinalysis and urine culture to evaluate for UTI.   Recommended follow up: No follow-ups on file. Future Appointments  Date Time Provider [place]  09/26/2021  2:20 PM Marin Olp, MD [facility]  11/14/2021  1:00 PM Shamleffer, Melanie Crazier, MD [facility] None    Lab/Order associations:   ICD-10-CM   1. Diverticulitis  K57.92 CBC with Differential/Platelet    Comprehensive metabolic panel    CANCELED: CBC with Differential/Platelet    CANCELED: Comprehensive metabolic panel    2. Suprapubic pain  R10.2 POCT Urinalysis Dipstick (Automated)  Urine Culture    CT Abdomen Pelvis W Contrast    CANCELED: CT Abdomen Pelvis W Contrast    CANCELED: CT Abdomen Pelvis W Contrast    3. Type 2 diabetes mellitus with hyperglycemia, without long-term current use of insulin (HCC)  E11.65 Hemoglobin A1c    Lipid panel     Time Spent: 32 minutes of total time (1:13 PM- 1:45 PM) was spent on the date of the encounter performing the following actions: chart review prior to seeing the patient, obtaining history, performing a medically necessary exam, counseling on the treatment plan, placing orders, and documenting in our EHR.   I,Jada Bradford,acting as a scribe for Garret Reddish, MD.,have documented all relevant documentation on the behalf of Garret Reddish, MD,as directed by  Garret Reddish, MD while in the presence of Garret Reddish, MD.   I, Garret Reddish, MD, have reviewed all documentation for this visit. The documentation on 09/08/21 for the exam, diagnosis, procedures, and orders are all accurate and complete.   Return precautions advised.  Garret Reddish, MD

## 2021-09-08 NOTE — Telephone Encounter (Signed)
Patient called states she has been having a lot of abdominal pain for about a week now seeking advise.

## 2021-09-08 NOTE — Telephone Encounter (Signed)
Left message for patient to please call back. 

## 2021-09-08 NOTE — Telephone Encounter (Signed)
I am sending this is to you because Tye Savoy NP is out all week. Called patient back and she states for 1 week she has had waves of pain in her lower, mid. abd. And it radiates to the right side. States this different than when she would have a flare-up in the past, because that would radiate to the left side. It is worse when having a BM. No emesis, but some nausea. She is taking her Protonix and Bentyl. She is seeing her PCP today for this as well. Please advise.

## 2021-09-08 NOTE — Patient Instructions (Addendum)
Health Maintenance Due  Topic Date Due   INFLUENZA VACCINE-   HOLD OFF until feeling better 06/30/2021   Go to a liquid base diet until you feel better- see attachment  Current guidelines do not suggest antibiotics but I am going to think about this some more and factor in your CT and may reconsider this  Doing STAT labs.Team please set up STAT CT.   Please schedule Tylenol 500 mg every 4 hours.   Please stop by lab before you go If you have mychart- we will send your results within 3 business days of [name] receiving them.  If you do not have mychart- we will call you about results within 5 business days of [name] receiving them.  *please also note that you will see labs on mychart as soon as they post. I will later go in and write notes on them- will say "notes from Dr. Yong Channel" - Blood and Urine  -if worsening symptoms eek care  Recommended follow up: 2-3 days- team please use same day appointment slot

## 2021-09-08 NOTE — Telephone Encounter (Signed)
She saw Dr. Yong Channel, her PCP, today. Blood work and a CT AP have been ordered and the results are pending. Dr. Yong Channel will follow up on these results. If a diagnosis is not established please advise her to go to the ED or schedule a GI office appt with me or any available APP if it can wait.

## 2021-09-08 NOTE — Telephone Encounter (Signed)
Nurse Assessment Nurse: Ysidro Evert, RN, Levada Dy Date/Time Lisa [name] Time): 09/08/2021 9:30:32 AM Confirm and document reason for call. If symptomatic, describe symptoms. ---Caller states she has pelvic pain that is coming and going for the last week or so. Does the patient have any new or worsening symptoms? ---Yes Will a triage be completed? ---Yes Related visit to physician within the last 2 weeks? ---No Does the PT have any chronic conditions? (i.e. diabetes, asthma, this includes High risk factors for pregnancy, etc.) ---Yes List chronic conditions. ---diabetes, hypertension Is this a behavioral health or substance abuse call? ---No Guidelines Guideline Title Affirmed Question Affirmed Notes Nurse Date/Time (Eastern Time) Pelvic Pain - Female [1] MODERATE (e.g., interferes with normal activities) pelvic pain AND [2] pain comes and goes (cramps) AND [3] present > 24 hours Ysidro Evert, RN, Levada Dy 09/08/2021 9:32:26 AM PLEASE NOTE: All timestamps contained within this report are represented as [place] Standard Time. CONFIDENTIALTY NOTICE: This fax transmission is intended only for the addressee. It contains information that is legally privileged, confidential or otherwise protected from use or disclosure. If you are not the intended recipient, you are strictly prohibited from reviewing, disclosing, copying using or disseminating any of this information or taking any action in reliance on or regarding this information. If you have received this fax in error, please notify us immediately by telephone so that we can arrange for its return to [name]. Phone: [phone], Toll-Free: [phone], Fax: [phone] Page: 2 of 2 Call Id: [id number] [place]. Time Lisa [name] Time) Disposition Final User 09/08/2021 9:26:33 AM Send to Urgent Queue Windy Canny 09/08/2021 9:37:16 AM See PCP within 24 Hours Yes Ysidro Evert, RN, Marin Shutter Disagree/Comply Comply Caller Understands Yes PreDisposition Did not  know what to do Care Advice Given Per Guideline SEE PCP WITHIN 24 HOURS: * IF OFFICE WILL BE OPEN: You need to be examined within the next 24 hours. Call your doctor (or NP/PA) when the office opens and make an appointment. CALL BACK IF: * Severe pain lasts over 1 hour * You become worse * Constant pain lasts over 2 hours CARE ADVICE given per Pelvic Pain, Female (Adult) guideline. Referrals REFERRED TO PCP OFFIC

## 2021-09-08 NOTE — Addendum Note (Signed)
Addended by: Loura Back on: 09/08/2021 01:55 PM   Modules accepted: Orders

## 2021-09-09 ENCOUNTER — Ambulatory Visit (HOSPITAL_COMMUNITY)
Admission: RE | Admit: 2021-09-09 | Discharge: 2021-09-09 | Disposition: A | Discharge status: Home / Self Care | Payer: Managed Care, Other (non HMO) | Source: Ambulatory Visit | Attending: Family Medicine | Admitting: Family Medicine

## 2021-09-09 DIAGNOSIS — R102 Pelvic and perineal pain: Secondary | ICD-10-CM | POA: Insufficient documentation

## 2021-09-09 LAB — URINE CULTURE
MICRO NUMBER:: 12482013
Result:: NO GROWTH
SPECIMEN QUALITY:: ADEQUATE

## 2021-09-09 MED ORDER — IOHEXOL 350 MG/ML SOLN
80.0000 mL | Freq: Once | INTRAVENOUS | Status: AC | PRN
  Administered 2021-09-09: 80 mL via INTRAVENOUS

## 2021-09-10 ENCOUNTER — Encounter: Payer: Self-pay | Admitting: Family Medicine

## 2021-09-10 ENCOUNTER — Other Ambulatory Visit: Payer: Self-pay | Admitting: Emergency Medicine

## 2021-09-10 ENCOUNTER — Other Ambulatory Visit: Payer: Self-pay | Admitting: *Deleted

## 2021-09-10 ENCOUNTER — Other Ambulatory Visit: Payer: Self-pay | Admitting: Family Medicine

## 2021-09-10 ENCOUNTER — Telehealth: Payer: Self-pay

## 2021-09-10 DIAGNOSIS — G4730 Sleep apnea, unspecified: Secondary | ICD-10-CM

## 2021-09-10 DIAGNOSIS — I700 Atherosclerosis of aorta: Secondary | ICD-10-CM | POA: Insufficient documentation

## 2021-09-10 MED ORDER — TRAMADOL HCL 50 MG PO TABS: 50.0000 mg | ORAL_TABLET | Freq: Three times a day (TID) | ORAL | 0 refills | Status: AC | PRN

## 2021-09-10 MED ORDER — AMOXICILLIN-POT CLAVULANATE 875-125 MG PO TABS: 1.0000 | ORAL_TABLET | Freq: Two times a day (BID) | ORAL | 0 refills | Status: DC

## 2021-09-10 NOTE — Telephone Encounter (Signed)
We see her on the 14th-please check on her and see if sugar and blood pressure is coming back down and if she has any new symptoms I be more pressing care.  I am going to do a result note soon and hopefully have the results on her CT

## 2021-09-10 NOTE — Telephone Encounter (Signed)
Patient Name: Lisa [name] Gender: Female DOB: [birth year] Age: 60 Y 11 M 28 D Return Phone Number: [phone] (Primary), [phone] (Secondary) Address: City/ State/ Zip: [place]  [address] Client [facility] [place] Garret Reddish- MD Contact Type Call Who Is Calling Patient / Member / Family / Caregiver Call Type Triage / Clinical Relationship To Patient Self Return Phone Number [phone] (Primary) Chief Complaint BLOOD PRESSURE HIGH - Systolic (top number) [phone] or greater Reason for Call Symptomatic / Request for [place] states her bp is high, meter not reading now , says it was 384 last night Translation No Nurse Assessment Nurse: Gildardo Pounds, RN, Amy Date/Time (Eastern Time): 09/10/2021 8:23:31 AM Confirm and document reason for call. If symptomatic, describe symptoms. ---Caller states her BP is high. Her meter will not read it this morning. It keeps saying error. It was 140/101 last night. She takes her BP meds at 10AM. Carvedilol & Telmisartan. She has missed a day or two recently, because of starting a new job. She had taken it yesterday. She says her BS is also high. It was 384 last night. It is 425 this morning. It is 365 right now. She has a slight HA. She has not felt faint. She doe not take DM medicine or insulin. She manages it with diet & exercise. Does the patient have any new or worsening symptoms? ---Yes Will a triage be completed? ---Yes Related visit to physician within the last 2 weeks? ---No Does the PT have any chronic conditions? (i.e. diabetes, asthma, this includes High risk factors for pregnancy, etc.) ---Yes List chronic conditions. ---DM, HTN Is this a behavioral health or substance abuse call? ---No PLEASE NOTE: All timestamps contained within this report are represented as [place] Standard  Time. CONFIDENTIALTY NOTICE: This fax transmission is intended only for the addressee. It contains information that is legally privileged, confidential or otherwise protected from use or disclosure. If you are not the intended recipient, you are strictly prohibited from reviewing, disclosing, copying using or disseminating any of this information or taking any action in reliance on or regarding this information. If you have received this fax in error, please notify us immediately by telephone so that we can arrange for its return to [name]. Phone: [phone], Toll-Free: [phone], Fax: [phone] Page: 2 of 2 Call Id: [id number] Guidelines Guideline Title Affirmed Question Affirmed Notes Nurse Date/Time Eilene Ghazi Time) Blood Pressure - High Systolic BP >= 767 OR Diastolic >= 341 [place], RN, Amy 09/10/2021 8:28:10 AM Diabetes - High Blood Sugar Blood glucose > 400 mg/dL (22.2 mmol/L) [place], RN, Amy 09/10/2021 8:32:28 AM Disp. Time Eilene Ghazi Time) Disposition Final User 09/10/2021 8:21:07 AM Send to Urgent Queue Dalia Heading 09/10/2021 8:32:15 AM SEE PCP WITHIN [place], RN, Amy 09/10/2021 8:40:00 AM Call PCP Now Yes [place], RN, Amy Caller Disagree/Comply Comply Caller Understands Yes PreDisposition InappropriateToAsk Care Advice Given Per Guideline SEE PCP WITHIN 3 DAYS: * You need to be seen within 2 or 3 days. * PCP VISIT: Call your doctor (or NP/PA) during regular office hours and make an appointment. A clinic or urgent care center are good places to go for care if your doctor's office is closed or you can't get an appointment. NOTE: If office will be open tomorrow, tell caller to call then, not in 3 days. CALL BACK IF: * Weakness or numbness of the face, arm or leg on  one side of the body occurs * Difficulty walking, difficulty talking, or severe headache occurs * Chest pain or difficulty breathing occurs * You become worse CARE ADVICE given per High Blood  Pressure (Adult) guideline. CALL PCP NOW: * You need to discuss this with your doctor (or NP/PA). CALL BACK IF: * Vomiting occurs * Rapid breathing occurs * You become worse CARE ADVICE given per Diabetes - High Blood Sugar (Adult) guideline. Comments User: Wayne Sever, RN Date/Time Eilene Ghazi Time): 09/10/2021 8:44:11 AM Notified Tammy at the office of patient's high BS. She is going to call the patient back.

## 2021-09-10 NOTE — Telephone Encounter (Signed)
Patient called in to team health.  Waiting on [facility] note.  Patient states she had a blood sugar of 400 today.  Patient has an appt on 10/14.    I called patient to schedule appt for today.  Had to leave VM.

## 2021-09-10 NOTE — Telephone Encounter (Signed)
Appt changed to tomorrow Requesting pain medication to help with diverticulitis

## 2021-09-11 ENCOUNTER — Other Ambulatory Visit: Payer: Self-pay

## 2021-09-11 ENCOUNTER — Telehealth: Payer: Self-pay | Admitting: Internal Medicine

## 2021-09-11 ENCOUNTER — Encounter: Payer: Self-pay | Admitting: Family Medicine

## 2021-09-11 ENCOUNTER — Ambulatory Visit (INDEPENDENT_AMBULATORY_CARE_PROVIDER_SITE_OTHER): Payer: Managed Care, Other (non HMO) | Admitting: Family Medicine

## 2021-09-11 VITALS — BP 120/78 | HR 76 | Temp 97.8°F | Ht 66.0 in | Wt 262.4 lb

## 2021-09-11 DIAGNOSIS — E1165 Type 2 diabetes mellitus with hyperglycemia: Secondary | ICD-10-CM

## 2021-09-11 DIAGNOSIS — I10 Essential (primary) hypertension: Secondary | ICD-10-CM

## 2021-09-11 DIAGNOSIS — K5792 Diverticulitis of intestine, part unspecified, without perforation or abscess without bleeding: Secondary | ICD-10-CM | POA: Diagnosis not present

## 2021-09-11 DIAGNOSIS — Z124 Encounter for screening for malignant neoplasm of cervix: Secondary | ICD-10-CM

## 2021-09-11 NOTE — Telephone Encounter (Signed)
Aldona Bar thank you so much for addressing this-much appreciated.  Just had visit with patient and seems to be improving.  Thanks for helping her

## 2021-09-11 NOTE — Patient Instructions (Addendum)
Health Maintenance Due  Topic Date Due   PAP SMEAR- Patient needs a GYN.   We will call you within two weeks about your referral to GYN. If you do not hear within 2 weeks, give [name] a call.   05/24/2021   OPHTHALMOLOGY EXAM If you have had your eye exam within the last year, please sign release of information at check out desk. If you have not had an eye exam within a year, please get one at this time as this is important for your diabetes care.  07/17/2021   Call endocrinology today- tell them a1c was 9.5 but also that sugars are even higher with infection into 300s and even 400s. You have diverticulitis right now so that may alter the plan.   Recommended follow up: reasonable to see me back Monday at 11 or 11 20 AM- I will leave this up to you but you can schedule before you leave if you want to- hoping you start to turn the corner by tomorrow morning on antibiotics. Continue liquid diet until pain at least 50% better- then can try soft options.

## 2021-09-11 NOTE — Telephone Encounter (Signed)
Per pt, Dr Yong Channel has been taking her sugars and her levels have been reaching from 300s to 400s. Very concerning. Pt doesn't feel any such way. Pt contact [phone] (work number)

## 2021-09-11 NOTE — Progress Notes (Signed)
Phone [phone] In person visit   Subjective:   Lisa [name] is a 60 y.o. year old very pleasant female patient who presents for/with See problem oriented charting Chief Complaint  Patient presents with   Diverticulitis   Hypertension   Hyperlipidemia   This visit occurred during the SARS-CoV-2 public health emergency.  Safety protocols were in place, including screening questions prior to the visit, additional usage of staff PPE, and extensive cleaning of exam room while observing appropriate contact time as indicated for disinfecting solutions.   Past Medical History-  Patient Active Problem List   Diagnosis Date Noted   Diastolic congestive heart failure ([place]) 04/17/2019    Priority: 1.   Type 2 diabetes mellitus ([place]) 03/11/2017    Priority: 1.   PAD (peripheral artery disease) ([place]) 01/19/2017    Priority: 1.   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   Morbid obesity ([place]) 02/14/2016    Priority: 1.   Tobacco use disorder 08/12/2015    Priority: 1.   COPD (chronic obstructive pulmonary disease) ([place]) 01/18/2015    Priority: 1.   CKD (chronic kidney disease), stage III ([place]) 09/18/2020    Priority: 2.   Fatty liver 11/06/2019    Priority: 2.   Hyperlipidemia 04/12/2018    Priority: 2.   Hyperlipidemia associated with type 2 diabetes mellitus ([place]) 09/06/2017    Priority: 2.   Breast mass 08/03/2017    Priority: 2.   GERD (gastroesophageal reflux disease) 02/14/2016    Priority: 2.   DOE (dyspnea on exertion) 03/08/2014    Priority: 2.   Migraine without aura 07/26/2013    Priority: 2.   Sleep apnea 02/11/2012    Priority: 2.   Chest discomfort 10/12/2011    Priority: 2.   Hypertension associated with diabetes ([place]) 10/12/2011    Priority: 2.   Chronic hip pain 07/29/2017    Priority: 3.   Left knee pain 05/15/2016    Priority: 3.   Flank pain 04/21/2016    Priority: 3.   Abdominal pain  02/28/2016    Priority: 3.   Pulmonary nodule, right 04/11/2015    Priority: 3.   Mediastinal lymphadenopathy 01/18/2015    Priority: 3.   Aortic atherosclerosis ([place]) 09/10/2021    Priority: 2.   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 20 tablet 0   ASPIRIN 81 PO Take 81 mg by mouth daily.     buPROPion (WELLBUTRIN SR) 150 MG 12 hr tablet Take 150 mg by mouth daily.     carvedilol (COREG) 25 MG tablet Take 1 tablet (25 mg total) by mouth 2 (two) times daily. 180 tablet 3   dicyclomine (BENTYL) 10 MG capsule Take 1 capsule (10 mg total) by mouth 3 (three) times daily before meals. As needed 270 capsule 3   NEXLIZET 180-10 MG TABS TAKE 1 TABLET BY MOUTH EVERY DAY 90 tablet 3   nitroGLYCERIN (NITRODUR - DOSED IN MG/24 HR) 0.2 mg/hr patch Apply 1/4 patch daily to tendon for tendonitis. 30 patch 1   ONE TOUCH LANCETS  MISC Use to check blood sugars twice a day 200 each 0   pantoprazole (PROTONIX) 40 MG tablet Take 1 tablet by mouth 30 mins before breakfast daily. 90 tablet 1   PRALUENT 75 MG/ML SOAJ INJECT 1 PEN INTO THE SKIN EVERY 14 (FOURTEEN) DAYS. 6 mL 3   spironolactone (ALDACTONE) 25 MG tablet TAKE 1 TABLET BY MOUTH EVERY DAY 90 tablet 1   telmisartan (MICARDIS) 40 MG tablet Take 1 tablet (40 mg total) by mouth daily. 90 tablet 3   traMADol (ULTRAM) 50 MG tablet Take 1 tablet (50 mg total) by mouth every 8 (eight) hours as needed for up to 5 days. 15 tablet 0   No current facility-administered medications for this visit.     Objective:  BP 120/78   Pulse 76   Temp 97.8  F (36.6 C) (Temporal)   Ht 5\' 6"  (1.676 m)   Wt 262 lb 6.4 oz (119 kg)   SpO2 99%   BMI 42.35 kg/m  Gen: NAD, resting comfortably CV: RRR no murmurs rubs or gallops Lungs: CTAB no crackles, wheeze, rhonchi Abdomen: soft/nontender even with deep palpation lower abdomen-was significantly tender the other day-with that being said patient seemed to be having waves of pain and she was not actively in a wave of pain when I palpated/nondistended/normal bowel sounds. No rebound or guarding.  Ext: no edema Skin: warm, dry     Assessment and Plan   #Diverticulitis S: Patient reported suprapubic and right lower quadrant pain at last visit-CT scan was done to rule out appendicitis and evaluate for diverticulitis  From CT report "IMPRESSION: 1. Acute uncomplicated sigmoid diverticulitis. No perforation, fluid collection, or abscess. 2. Normal appendix. 3. Hepatic steatosis. 4.  Aortic Atherosclerosis (ICD10-I70.0)."  Originally we discussed liquid diet as primary treatment but we got a call about extremely elevated blood pressures and blood sugar yesterday and in light of that I opted to treat with Augmentin (she reported tolerating this in the past).  Short course of tramadol was also called in by my colleague yesterday for pain control.  Today patient reports, pain is very similar 3% improvement maybe from last visit after starting antibiotics last night. Struggling with liquid diet- doing her best- states rather hungry with this. Pain coming in waves. Sitting is actually worse- walking around helps. Mainly watery stools.  -has not picked up tramadol yet sent in (didn't realize was supposed to) - knows not to drive for 8 hours after taking.  A/P: Patient reports only 3% improvement in pain but on exam I was unable to reproduce the level of significant tenderness noted the other day.  We are going to continue liquid diet until she reports at least 50% improvement in pain.  We will continue  Augmentin which was started due to even more significantly elevated blood sugars and her A1c would suggest as well as reported high blood pressure at home but thankfully blood pressure is better here today.  I offered appointment on Monday to recheck but she would like to hold off on that for now-she is going to consider-in the end it appears she scheduled this - I am also reassured that patient is likely improving based on her request at the end of visit if she could have sex-I did caution her this had potential to worsen pain -Discussed could use tramadol pain control with not to drive for 8 hours after use  #hypertension S: medication: spironolactone 25 mg, telmisartan 40mg , coreg 25mg  BID Home readings #s:  have been elevated- she thinks machine may be off BP Readings from Last 3 Encounters:  09/11/21 120/78  09/08/21 126/68  08/18/21 128/78  A/P: Blood pressure well controlled today-continue current medication as long as she can continue to push fluids  # Diabetes-sees endocrine S: Medication:diet controlled previously. She states loosened up diet and was not checking sugars recently  CBGs- had not been checking until last few days. A1c extremely high.  Lab Results  Component Value Date   HGBA1C 9.5 (H) 09/08/2021   HGBA1C 6.5 03/21/2021   HGBA1C 5.9 (H) 09/12/2020    A/P: Very poor control of diabetes-A1c would suggest average blood sugar around 220 or 230 but her sugars have been well over 300 with diverticulitis/pain.  This was only recently started Augmentin to see if we can hasten improvement.  Recommended making sure all of her fluid intake is still sugar-free.  Encouraged her to call her endocrinologist ASAP/today for suggestions-needs sooner visit than December -She has been on Ozempic in the past but I do not think that would be a great choice right now due to diverticulitis.  Also has not tolerated metformin in the past.  She is fearful of insulin but I think it may be a good  short-term solution  Recommended follow up:  Future Appointments  Date Time Provider [place]  09/15/2021 11:20 AM Marin Olp, MD [facility]  09/26/2021  2:20 PM Marin Olp, MD [facility]  11/14/2021  1:00 PM Shamleffer, Melanie Crazier, MD [facility] None    Lab/Order associations:   ICD-10-CM   1. Screening for cervical cancer  Z12.4 Ambulatory referral to Gynecology      No orders of the defined types were placed in this encounter.   Return precautions advised.  Garret Reddish, MD

## 2021-09-11 NOTE — Telephone Encounter (Signed)
09/10/2021 7:30am 425  09/11/2021 8:30am 349  09/11/2021 3:49 pm  294  Not currently taking any medication   Patient not having any symptoms  Patient has not been checking on regular  Patient has been having some fatigue

## 2021-09-12 ENCOUNTER — Ambulatory Visit: Payer: Managed Care, Other (non HMO) | Admitting: Family Medicine

## 2021-09-12 NOTE — Telephone Encounter (Signed)
Received faxed refill request from pharmacy  Medication name/strength/dose: bupropion 150 mg Medication last rx'd: 07/29/2020 Quantity and number of refills last rx'd: #180 with no refills Instructions: 1 tablet BID  Last OV: 08/12/2021 ( TP) last seen RB on 09/10/2020 Next OV: no pending appts  TP  please advise on refill request  Allergies  Allergen Reactions   Almond Oil Anaphylaxis and Nausea And Vomiting    Almond-Nuts Only   Apple Anaphylaxis   Ceftin [Cefuroxime] Other (See Comments)    Severe stomach pain - Diverticulitis flares up   Cherry Extract Anaphylaxis    Cherry fruit only    Other Anaphylaxis and Itching    Most fruits- Are NOT tolerated (PLEASE ASK PATIENT BEFORE GIVING, AS CERTAIN FRUITS ARE TOLERATED IN LIMITED QUANTITIES!!)   Guaifenesin & Derivatives Other (See Comments)    Restlessness, jittery   Hctz [Hydrochlorothiazide] Other (See Comments)    Causes a headache   Naproxen Other (See Comments)    Insomnia   Lisinopril Cough        Current Outpatient Medications on File Prior to Visit  Medication Sig Dispense Refill   amoxicillin-clavulanate (AUGMENTIN) 875-125 MG tablet Take 1 tablet by mouth 2 (two) times daily. 20 tablet 0   ASPIRIN 81 PO Take 81 mg by mouth daily.     buPROPion (WELLBUTRIN SR) 150 MG 12 hr tablet Take 150 mg by mouth daily.     carvedilol (COREG) 25 MG tablet Take 1 tablet (25 mg total) by mouth 2 (two) times daily. 180 tablet 3   dicyclomine (BENTYL) 10 MG capsule Take 1 capsule (10 mg total) by mouth 3 (three) times daily before meals. As needed 270 capsule 3   NEXLIZET 180-10 MG TABS TAKE 1 TABLET BY MOUTH EVERY DAY 90 tablet 3   nitroGLYCERIN (NITRODUR - DOSED IN MG/24 HR) 0.2 mg/hr patch Apply 1/4 patch daily to tendon for tendonitis. 30 patch 1   ONE TOUCH LANCETS MISC Use to check blood sugars twice a day 200 each 0   pantoprazole (PROTONIX) 40 MG tablet Take 1 tablet by mouth 30 mins before breakfast daily. 90 tablet 1    PRALUENT 75 MG/ML SOAJ INJECT 1 PEN INTO THE SKIN EVERY 14 (FOURTEEN) DAYS. 6 mL 3   spironolactone (ALDACTONE) 25 MG tablet TAKE 1 TABLET BY MOUTH EVERY DAY 90 tablet 1   telmisartan (MICARDIS) 40 MG tablet Take 1 tablet (40 mg total) by mouth daily. 90 tablet 3   traMADol (ULTRAM) 50 MG tablet Take 1 tablet (50 mg total) by mouth every 8 (eight) hours as needed for up to 5 days. 15 tablet 0   No current facility-administered medications on file prior to visit.

## 2021-09-12 NOTE — Telephone Encounter (Signed)
Patient has been scheduled to for 10/03/2021

## 2021-09-15 ENCOUNTER — Ambulatory Visit: Payer: Managed Care, Other (non HMO) | Admitting: Family Medicine

## 2021-09-16 ENCOUNTER — Encounter: Payer: Self-pay | Admitting: Family Medicine

## 2021-09-16 ENCOUNTER — Other Ambulatory Visit: Payer: Self-pay

## 2021-09-16 ENCOUNTER — Ambulatory Visit (INDEPENDENT_AMBULATORY_CARE_PROVIDER_SITE_OTHER): Payer: Managed Care, Other (non HMO) | Admitting: Family Medicine

## 2021-09-16 VITALS — BP 138/78 | HR 94 | Temp 97.7°F | Ht 66.0 in | Wt 261.6 lb

## 2021-09-16 DIAGNOSIS — K5792 Diverticulitis of intestine, part unspecified, without perforation or abscess without bleeding: Secondary | ICD-10-CM | POA: Diagnosis not present

## 2021-09-16 DIAGNOSIS — E1165 Type 2 diabetes mellitus with hyperglycemia: Secondary | ICD-10-CM

## 2021-09-16 DIAGNOSIS — Z23 Encounter for immunization: Secondary | ICD-10-CM

## 2021-09-16 DIAGNOSIS — I10 Essential (primary) hypertension: Secondary | ICD-10-CM

## 2021-09-16 DIAGNOSIS — E785 Hyperlipidemia, unspecified: Secondary | ICD-10-CM

## 2021-09-16 NOTE — Patient Instructions (Addendum)
Health Maintenance Due  Topic Date Due   PAP SMEAR-let us know if you have not heard within 10 days about referral to GYN  05/24/2021   OPHTHALMOLOGY EXAM - schedule this and have them send [name] a copy  07/17/2021   I am very happy to see that you are improving in your symptoms!  Continue taking your antibiotics up until tomorrow morning - after tomorrow morning you can stop. Please know that once you finish the antibiotics, you want to transition into a high fiber diet as this will be beneficial for you and prevent inflammation. If recurrence of pain let me know  I am glad to hear that you have cut out sodas and increased your water intake.  I would like for you to gradually increase the amount of steps you get with walking per week - your goal is to reach 3500 steps up from under 1000 per day.   Recommended follow up: Return in about 4 months (around 01/17/2022) for physical or sooner if needed.

## 2021-09-16 NOTE — Progress Notes (Signed)
Phone [phone] In person visit   Subjective:   Lisa [name] is a 60 y.o. year old very pleasant female patient who presents for/with See problem oriented charting Chief Complaint  Patient presents with   Diverticulitis   This visit occurred during the SARS-CoV-2 public health emergency.  Safety protocols were in place, including screening questions prior to the visit, additional usage of staff PPE, and extensive cleaning of exam room while observing appropriate contact time as indicated for disinfecting solutions.   Past Medical History-  Patient Active Problem List   Diagnosis Date Noted   Diastolic congestive heart failure ([place]) 04/17/2019    Priority: 1.   Type 2 diabetes mellitus ([place]) 03/11/2017    Priority: 1.   PAD (peripheral artery disease) ([place]) 01/19/2017    Priority: 1.   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   Morbid obesity ([place]) 02/14/2016    Priority: 1.   Tobacco use disorder 08/12/2015    Priority: 1.   COPD (chronic obstructive pulmonary disease) ([place]) 01/18/2015    Priority: 1.   CKD (chronic kidney disease), stage III ([place]) 09/18/2020    Priority: 2.   Fatty liver 11/06/2019    Priority: 2.   Hyperlipidemia 04/12/2018    Priority: 2.   Hyperlipidemia associated with type 2 diabetes mellitus ([place]) 09/06/2017    Priority: 2.   Breast mass 08/03/2017    Priority: 2.   GERD (gastroesophageal reflux disease) 02/14/2016    Priority: 2.   DOE (dyspnea on exertion) 03/08/2014    Priority: 2.   Migraine without aura 07/26/2013    Priority: 2.   Sleep apnea 02/11/2012    Priority: 2.   Chest discomfort 10/12/2011    Priority: 2.   Hypertension associated with diabetes ([place]) 10/12/2011    Priority: 2.   Chronic hip pain 07/29/2017    Priority: 3.   Left knee pain 05/15/2016    Priority: 3.   Flank pain 04/21/2016    Priority: 3.   Abdominal pain 02/28/2016    Priority: 3.    Pulmonary nodule, right 04/11/2015    Priority: 3.   Mediastinal lymphadenopathy 01/18/2015    Priority: 3.   Aortic atherosclerosis ([place]) 09/10/2021    Priority: 2.   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 20 tablet 0   ASPIRIN 81 PO Take 81 mg by mouth daily.     buPROPion (WELLBUTRIN SR) 150 MG 12 hr tablet Take 150 mg by mouth daily.     carvedilol (COREG) 25 MG tablet Take 1 tablet (25 mg total) by mouth 2 (two) times daily. 180 tablet 3   dicyclomine (BENTYL) 10 MG capsule Take 1 capsule (10 mg total) by mouth 3 (three) times daily before meals. As needed 270 capsule 3   NEXLIZET 180-10 MG TABS TAKE 1 TABLET BY MOUTH EVERY DAY 90 tablet 3   nitroGLYCERIN (NITRODUR - DOSED IN MG/24 HR) 0.2 mg/hr patch Apply 1/4 patch daily to tendon for tendonitis. 30 patch 1   ONE TOUCH LANCETS MISC Use to check blood sugars  twice a day 200 each 0   pantoprazole (PROTONIX) 40 MG tablet Take 1 tablet by mouth 30 mins before breakfast daily. 90 tablet 1   PRALUENT 75 MG/ML SOAJ INJECT 1 PEN INTO THE SKIN EVERY 14 (FOURTEEN) DAYS. 6 mL 3   spironolactone (ALDACTONE) 25 MG tablet TAKE 1 TABLET BY MOUTH EVERY DAY 90 tablet 1   telmisartan (MICARDIS) 40 MG tablet Take 1 tablet (40 mg total) by mouth daily. 90 tablet 3   No current facility-administered medications for this visit.     Objective:  BP 138/78   Pulse 94   Temp 97.7 F (36.5 C) (Temporal)   Ht 5\' 6"  (1.676 m)   Wt 261 lb 9.6 oz (118.7 kg)   SpO2 97%   BMI 42.22 kg/m  Gen: NAD, resting comfortably-much improved appearance  and energy levels CV: RRR no murmurs rubs or gallops Lungs: CTAB no crackles, wheeze, rhonchi Abdomen: soft/nontender/nondistended/normal bowel sounds. No rebound or guarding.  Does not gravid her abdomen in pain at all today Ext: no edema Skin: warm, dry     Assessment and Plan   # Diverticulitis  S: finally by Friday started to make significant turn in symptoms by afternoon evening on her birthday- then on Saturday then symptoms 95% better and by Sunday no symptoms. Sunday had first solid food- solid chicken wings and salad - used oil and vinegar- didn't bother her. No BM for 2 days and Monday had a big bowel movement. Took antibiotic this morning.  -when she recollects further had small piece of veggie stromboli  A/P: Diverticulitis appears to have completely resolved.  Soft nontender abdomen.  Reports pain has resolved.  She will finish antibiotics through 7 days and then discard additional antibiotics. - knows after discussion today once healed to do high fiber diet to prevent recurrence -Seemed to make much quicker turnaround after starting antibiotics-we will keep this in mind in case has recurrent diverticulitis in the future despite recent changes and recommendations for treatment  #hypertension S: medication: spironolactone 25 mg daily, telmisartan 40 mg daily, coreg 25 mg BID  BP Readings from Last 3 Encounters:  09/16/21 138/78  09/11/21 120/78  09/08/21 126/68  A/P:  Controlled. Continue current medications.    # Diabetes S: Medication: diet controlled until recently -last visit patient loosened up her diet and was not checking sugars  - home #s 250s to 320s- have been trending down slightly into low 200s after treatment for diverticulitis and improving diet - she has appt on nov 4th with endocrine now - has cut out sodas for last 3 weeks, increased water intake -ozempic in past but holding off until sees endocrine with recent diverticulitis -agrees to try to increase  walking Lab Results  Component Value Date   HGBA1C 9.5 (H) 09/08/2021   A/P: Poor control-glad she was able to get closer follow-up with endocrine-she wants to hold off on medication and discuss with endocrine.  Discussed importance of being aggressive about dietary changes  #Tobacco abuse-strongly recommended cessation-patient not ready to quit at this time  #hyperlipidemia S: Medication:Praluent 75 mg/mL weekly , nexlizet (was off at last labs) Lab Results  Component Value Date   CHOL 147 09/08/2021   HDL 23.50 (L) 09/08/2021   LDLCALC 57 06/19/2020   LDLDIRECT 89.0 09/08/2021   TRIG 392.0 (H) 09/08/2021   CHOLHDL 6 09/08/2021   A/P: Mild poor control last check-patient has restarted Nexlizet since that time-suspect will have improvement on next labs but too  early to check  Recommended follow up: Return in about 4 months (around 01/17/2022) for physical or sooner if needed. Future Appointments  Date Time Provider [place]  09/26/2021  2:20 PM Marin Olp, MD [facility]  10/03/2021 11:30 AM Shamleffer, Melanie Crazier, MD [facility] None  03/24/2022  1:00 PM Marin Olp, MD [facility]    Lab/Order associations:   ICD-10-CM   1. Hyperlipidemia, unspecified hyperlipidemia type  E78.5     2. Type 2 diabetes mellitus with hyperglycemia, without long-term current use of insulin (HCC)  E11.65     3. Essential hypertension  I10     4. Diverticulitis  K57.92     5. Gastroesophageal reflux disease, unspecified whether esophagitis present  K21.9     6. Need for immunization against influenza  Z23 Flu Vaccine QUAD 54mo+IM (Fluarix, Fluzone & Alfiuria Quad PF)    Finally able to get flu shot today since feeling so much better  I,Harris Phan,acting as a scribe for Garret Reddish, MD.,have documented all relevant documentation on the behalf of Garret Reddish, MD,as directed by  Garret Reddish, MD while in the presence of Garret Reddish, MD.   I, Garret Reddish,  MD, have reviewed all documentation for this visit. The documentation on 09/16/21 for the exam, diagnosis, procedures, and orders are all accurate and complete.   Return precautions advised.  Garret Reddish, MD

## 2021-09-19 ENCOUNTER — Encounter: Payer: Self-pay | Admitting: Family Medicine

## 2021-09-19 NOTE — Progress Notes (Signed)
Phone [phone]   Subjective:  Patient presents today for their annual physical. Chief complaint-noted.   See problem oriented charting- Review of Systems  Constitutional:  Negative for chills and fever.  HENT:  Negative for ear pain and sinus pain.   Eyes:  Negative for blurred vision and double vision.  Respiratory:  Negative for cough and shortness of breath.   Cardiovascular:  Negative for chest pain (other than has noted mild pinch of pain if holding bladder for a while- no exertoinal symptoms) and palpitations.  Gastrointestinal:  Negative for heartburn, nausea and vomiting.  Genitourinary:  Negative for dysuria and frequency.  Musculoskeletal:  Positive for joint pain.  Skin:  Negative for rash.  Neurological:  Positive for headaches (no worsening headache pattern- tend sto get headaches). Negative for dizziness.  Endo/Heme/Allergies:  Negative for polydipsia. Does not bruise/bleed easily.  Psychiatric/Behavioral:  Negative for depression and suicidal ideas.  -  The following were reviewed and entered/updated in epic: Past Medical History:  Diagnosis Date   Allergic rhinitis    Arthritis    Asthma    Breast nodule    right breast, to see dr Helane Rima 06-20-2012 for    Chest pain, non-cardiac    Chronic headaches    Cigarette smoker    Complication of anesthesia 05-17-2012   trouble breathing after colonscopy, needed nebulizer   COPD (chronic obstructive pulmonary disease) ([place])    Diastolic congestive heart failure ([place]) 04/17/2019   Diverticulosis    GERD (gastroesophageal reflux disease)    Hepatic hemangioma    Hypertension    IBS (irritable bowel syndrome)    Nuclear stress test    Myoview 05/2019: EF 63, no ischemia. Low Risk   Obesity    Prosthetic eye globe    Pyloric erosion    Sleep apnea    CPAP setting varies from 4-10   Type 2 diabetes mellitus ([place]) 03/11/2017   Patient Active Problem List   Diagnosis Date Noted   Diastolic congestive heart  failure ([place]) 04/17/2019    Priority: 1.   Type 2 diabetes mellitus ([place]) 03/11/2017    Priority: 1.   PAD (peripheral artery disease) ([place]) 01/19/2017    Priority: 1.   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   Morbid obesity ([place]) 02/14/2016    Priority: 1.   Tobacco use disorder 08/12/2015    Priority: 1.   COPD (chronic obstructive pulmonary disease) ([place]) 01/18/2015    Priority: 1.   CKD (chronic kidney disease), stage III ([place]) 09/18/2020    Priority: 2.   Fatty liver 11/06/2019    Priority: 2.   Hyperlipidemia 04/12/2018    Priority: 2.   Hyperlipidemia associated with type 2 diabetes mellitus ([place]) 09/06/2017    Priority: 2.   Breast mass 08/03/2017    Priority: 2.   GERD (gastroesophageal reflux disease) 02/14/2016    Priority: 2.   DOE (dyspnea on exertion) 03/08/2014    Priority: 2.   Migraine without aura 07/26/2013    Priority: 2.   Sleep apnea 02/11/2012    Priority: 2.   Chest discomfort 10/12/2011    Priority: 2.   Hypertension associated with diabetes ([place]) 10/12/2011    Priority: 2.   Chronic hip pain 07/29/2017    Priority: 3.   Left knee pain 05/15/2016    Priority: 3.   Flank pain 04/21/2016    Priority: 3.   Abdominal pain 02/28/2016    Priority: 3.  Pulmonary nodule, right 04/11/2015    Priority: 3.   Mediastinal lymphadenopathy 01/18/2015    Priority: 3.   Aortic atherosclerosis ([place]) 09/10/2021    Priority: 2.   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 IT REVEALS NORMAL/SUPRANORMAL LEFT VENTRICULAR SYSTOLIC FUNCTION   CARDIOVASCULAR STRESS TEST  03/25/2007   EF 78%   CESAREAN SECTION  2003, 2009   x 2   CHOLECYSTECTOMY  1993   COLONOSCOPY  2014   ENDOMETRIAL ABLATION  09/2010   and D&C   HERNIA REPAIR  9147   umbilical   LAPAROSCOPY     PERIPHERAL VASCULAR BALLOON ANGIOPLASTY Left 04/30/2017   Procedure: Peripheral Vascular Balloon Angioplasty;  Surgeon: Elam Dutch, MD;  Location: [facility];  Service: Cardiovascular;  Laterality: Left;  SFA   RECONSTRUCTION OF EYELID Bilateral    TUBAL LIGATION     UMBILICAL HERNIA REPAIR  2004   US ECHOCARDIOGRAPHY  05/08/2008   EF 55-60%   VENTRAL HERNIA REPAIR  06/15/2012   Procedure: LAPAROSCOPIC VENTRAL HERNIA;  Surgeon: Edward Jolly, MD;  Location: [facility];  Service: General;  Laterality: N/A;  LAPAROSCOPIC REPAIR VENTRAL HERNIA   VIDEO BRONCHOSCOPY Bilateral 11/15/2017   Procedure: VIDEO BRONCHOSCOPY WITH FLUORO;  Surgeon: Collene Gobble, MD;  Location: [facility];  Service: Cardiopulmonary;  Laterality: Bilateral;    Family History  Problem Relation Age of Onset   Hypertension Mother    Lung cancer Mother        80   Diabetes Maternal Grandmother    Heart disease Father        pacemaker or defibrillator   Heart attack Paternal Uncle    Breast cancer Other    Colon cancer Neg Hx    Esophageal cancer Neg Hx    Pancreatic cancer Neg Hx     Medications- reviewed and updated Current Outpatient Medications  Medication Sig Dispense Refill   ASPIRIN 81 PO Take 81 mg by mouth daily.     buPROPion (WELLBUTRIN SR) 150 MG 12 hr tablet Take 150 mg by mouth daily.     carvedilol (COREG) 25 MG tablet Take 1 tablet (25 mg total) by mouth 2 (two) times daily. 180 tablet 3   dicyclomine (BENTYL) 10 MG capsule Take 1 capsule (10 mg total) by mouth 3 (three) times daily before  meals. As needed 270 capsule 3   NEXLIZET 180-10 MG TABS TAKE 1 TABLET BY MOUTH EVERY DAY 90 tablet 3   ONE TOUCH LANCETS MISC Use to check blood sugars twice a day 200 each 0   pantoprazole (PROTONIX) 40 MG tablet Take 1 tablet by mouth 30 mins before breakfast daily. 90 tablet 1   PRALUENT 75 MG/ML SOAJ INJECT 1 PEN INTO THE SKIN EVERY 14 (FOURTEEN) DAYS. 6 mL 3   spironolactone (ALDACTONE) 25 MG tablet TAKE 1 TABLET BY MOUTH EVERY DAY 90 tablet 1   telmisartan (MICARDIS) 40 MG tablet Take 1 tablet (40 mg total)  by mouth daily. 90 tablet 3   No current facility-administered medications for this visit.    Allergies-reviewed and updated Allergies  Allergen Reactions   Almond Oil Anaphylaxis and Nausea And Vomiting    Almond-Nuts Only   Apple Anaphylaxis   Ceftin [Cefuroxime] Other (See Comments)    Severe stomach pain - Diverticulitis flares up   Cherry Extract Anaphylaxis    Cherry fruit only    Other Anaphylaxis and Itching    Most fruits- Are NOT tolerated (PLEASE ASK PATIENT BEFORE GIVING, AS CERTAIN FRUITS ARE TOLERATED IN LIMITED QUANTITIES!!)   Guaifenesin & Derivatives Other (See Comments)    Restlessness, jittery   Hctz [Hydrochlorothiazide] Other (See Comments)    Causes a headache   Naproxen Other (See Comments)    Insomnia   Lisinopril Cough         Social History   Social History Narrative   Married 1997. 2 sons 67 and 11 years old in 10/18. Wants to see children grow up.       Customer Service/accounts payable.    Went to school for medical assisting- has had a hard time finding work      Hobbies: [employer], enjoying [place], getting out of home, wants to take [employer]   Objective  Objective:  BP 127/79   Pulse 78   Temp 98 F (36.7 C) (Oral)   Ht 5\' 6"  (1.676 m)   Wt 257 lb 9.6 oz (116.8 kg)   SpO2 100%   BMI 41.58 kg/m  Gen: NAD, resting comfortably HEENT: Mucous membranes are moist. Oropharynx normal Neck: no thyromegaly CV: RRR no murmurs rubs  or gallops Lungs: CTAB no crackles, wheeze, rhonchi Abdomen: soft/nontender/nondistended/normal bowel sounds. No rebound or guarding.  Morbid obesity noted.  Diastases recti noted-patient had some spasm on the right side with diastases recti when she lays down-not an obvious hernia but will monitor Ext: no edema Skin: warm, dry Neuro: grossly normal, moves all extremities, PERRLA Breasts: normal appearance, no masses or tenderness Pelvic: cervix normal in appearance, external genitalia normal, and vagina normal without discharge. Patient was having some discomfort laying down- we did not complete bimanual exam     Assessment and Plan   59 y.o. female presenting for annual physical.  Health Maintenance counseling: 1. Anticipatory guidance: Patient counseled regarding regular dental exams -q6 months , eye exams - yearly needs to schedule,  avoiding smoking and second hand smoke- trying to reduce- now down under 1 pack a day- smoking outside- strongly encouraged cessation , limiting alcohol to 1 beverage per day- no alcohol. .  No illicit drugs 2. Risk factor reduction:  Advised patient of need for regular exercise and diet rich and fruits and vegetables to reduce risk of heart attack and stroke.  Exercise- doing some chair exercises at work, not exercising outside of work. Free gym with [place] or may get back out in the track.  Diet- had lost weight last year then regained and lost again- same weight as last year (weight down recently could be from diverticulitis or high a1c). Discussed half plate method Wt Readings from Last 3 Encounters:  09/26/21 257 lb 9.6 oz (116.8 kg)  09/16/21 261 lb 9.6 oz (118.7 kg)  09/11/21 262 lb 6.4 oz (119 kg)  3. Immunizations/screenings/ancillary studies Immunization History  Administered Date(s) Administered   Influenza Split 12/31/2013, 08/30/2014   Influenza,inj,Quad PF,6+ Mos 08/12/2015, 08/21/2016, 09/06/2017, 09/14/2018, 09/01/2019, 09/10/2020,  09/16/2021   PFIZER(Purple Top)SARS-COV-2 Vaccination 02/22/2020, 03/18/2020, 10/07/2020  PPD Test 03/04/2012   Pneumococcal Polysaccharide-23 06/16/2012   Tdap 03/04/2012  4. Cervical cancer screening- pap smear 05/24/2018 with a 3 year repeat planned - Wants to establish with Dr. Noni Saupe request updated Pap on exam today- may still eventually see gyn in long run. Will do HPV testing with hopes to extend interval to 5 years for her 5. Breast cancer screening-  breast exam today and mammogram 12/22/2018- ordered 3d mammogram 6. Colon cancer screening - colonoscopy 06/12/2020 with 3 year repeat planned 7. Skin cancer screening-  lower risk due to melanin content. advised regular sunscreen use. Denies worrisome, changing, or new skin lesions.  8. Birth control/STD check- postmenopause and only active with husband 2. Osteoporosis screening at 65-We will plan on this she prefers to wait -Current smoker-  just under a pack a day - encouraged full cessation. Enrolled in lung cancer screening program with Dr. Lamonte Sakai- history of hematuria- was told no further workup unless worsens- no gross blood- offered urine microscopic- declines for now  Status of chronic or acute concerns   # Diverticulitis  S: patient started to make significant turn in symptoms by afternoon evening on her birthday ([birth year])- then that Saturday then symptoms 95% better and by Sunday no symptoms. Sunday had first solid food- solid chicken wings and salad - used oil and vinegar- didn't bother her. No BM for 2 days and that following Monday had a big bowel movement. Took antibiotic the morning of visit (09/16/2021)  -when she recollected further had small piece of veggie stromboli  Diverticulitis appeared to have completely resolved.  Soft nontender abdomen.  Reported had pain has resolved.  She finished antibiotics through 7 days and then discard additional antibiotics. A/P: thankfully completely cleared at this point    #hypertension/CHF- cardiology says only diastolic dysfunction though S: medication: spironolactone 25 mg daily, telmisartan 40 mg daily, coreg 25 mg twice a day No increased swelling or SOB or weight gain BP Readings from Last 3 Encounters:  09/26/21 127/79  09/16/21 138/78  09/11/21 120/78  A/P: Controlled. Continue current medications.   CHF- appears euvolemic- continue current meds  CKD III- stable on last labs  # Diabetes S: Medication: diet controlled unti last visit-last visit patient loosened up her diet and was not checking sugars  Lab Results  Component Value Date   HGBA1C 9.5 (H) 09/08/2021   HGBA1C 6.5 03/21/2021   HGBA1C 5.9 (H) 09/12/2020   A/P: Very poor control-encouraged follow-up with endocrinology she is scheduled within a week thankfully -requests cbg check today with cma  #PAD  #hyperlipidemia S: Medication:Praluent 75 mg/mL weekly , nexlizet (was off at last labs) Lab Results  Component Value Date   CHOL 147 09/08/2021   HDL 23.50 (L) 09/08/2021   LDLCALC 57 06/19/2020   LDLDIRECT 89.0 09/08/2021   TRIG 392.0 (H) 09/08/2021   CHOLHDL 6 09/08/2021   A/P: labs close to goal last time except triglycerides which were high due to DM likely- she is fnally back on praluent and nelizet- both were off on last labs- we expect improvement by next las too soon to check today  Recommended follow up: No follow-ups on file. Future Appointments  Date Time Provider [place]  10/03/2021 11:30 AM Shamleffer, Melanie Crazier, MD [facility] None  11/03/2021  3:00 PM Magdalen Spatz, NP [facility] None  11/03/2021  4:30 PM [facility] 1 [facility]  03/24/2022  1:00 PM Marin Olp, MD [facility]   Lab/Order associations: has already had  labs   ICD-10-CM   1. Preventative health care  Z00.00 Cytology - PAP( [place])    2. Diverticulitis  K57.92     3. Essential hypertension  I10     4. Type 2 diabetes mellitus with hyperglycemia, without  long-term current use of insulin (HCC)  E11.65     5. Tobacco use disorder  F17.200     6. Encounter for screening mammogram for malignant neoplasm of breast  Z12.31 MM 3D SCREEN BREAST BILATERAL    7. Hypertension associated with diabetes ([place])  E11.59    I15.2     8. Hyperlipidemia associated with type 2 diabetes mellitus (HCC)  E11.69    E78.5     9. Diastolic congestive heart failure, unspecified HF chronicity ([place])  I50.30       I,Jada Bradford,acting as a scribe for Garret Reddish, MD.,have documented all relevant documentation on the behalf of Garret Reddish, MD,as directed by  Garret Reddish, MD while in the presence of Garret Reddish, MD.   I, Garret Reddish, MD, have reviewed all documentation for this visit. The documentation on 09/26/21 for the exam, diagnosis, procedures, and orders are all accurate and complete.   Return precautions advised.  Garret Reddish, MD

## 2021-09-19 NOTE — Patient Instructions (Addendum)
Health Maintenance Due  Topic Date Due   PAP SMEAR-Modifier - done today 05/24/2021   OPHTHALMOLOGY EXAM - - Please schedule an appointment and let them send a copy to [name] after to put in our system.  07/17/2021   Team please prick her finger for  poc CBG today! Per her request under diabetes  [place] Please call and schedule an appointment for 3D mammogram by calling [phone].  Great job on your dietary intake and doing your chair exercises! Keep up the good work! Try to exercise 150 minutes per week outside of work and we need to gain some momentum on further weight loss and blood sugar control  Please try the half-plate method to help with your calorie intake.   Recommended follow up: No follow-ups on file.

## 2021-09-20 ENCOUNTER — Other Ambulatory Visit: Payer: Self-pay | Admitting: Emergency Medicine

## 2021-09-25 ENCOUNTER — Other Ambulatory Visit: Payer: Self-pay | Admitting: *Deleted

## 2021-09-25 DIAGNOSIS — F17210 Nicotine dependence, cigarettes, uncomplicated: Secondary | ICD-10-CM

## 2021-09-26 ENCOUNTER — Ambulatory Visit (INDEPENDENT_AMBULATORY_CARE_PROVIDER_SITE_OTHER): Payer: Managed Care, Other (non HMO) | Admitting: Family Medicine

## 2021-09-26 ENCOUNTER — Other Ambulatory Visit (HOSPITAL_COMMUNITY)
Admission: RE | Admit: 2021-09-26 | Discharge: 2021-09-26 | Disposition: A | Discharge status: Home / Self Care | Payer: Managed Care, Other (non HMO) | Source: Ambulatory Visit | Attending: Family Medicine | Admitting: Family Medicine

## 2021-09-26 ENCOUNTER — Other Ambulatory Visit: Payer: Self-pay

## 2021-09-26 ENCOUNTER — Encounter: Payer: Self-pay | Admitting: Family Medicine

## 2021-09-26 VITALS — BP 127/79 | HR 78 | Temp 98.0°F | Ht 66.0 in | Wt 257.6 lb

## 2021-09-26 DIAGNOSIS — E1165 Type 2 diabetes mellitus with hyperglycemia: Secondary | ICD-10-CM | POA: Diagnosis not present

## 2021-09-26 DIAGNOSIS — Z0000 Encounter for general adult medical examination without abnormal findings: Secondary | ICD-10-CM | POA: Insufficient documentation

## 2021-09-26 DIAGNOSIS — Z1231 Encounter for screening mammogram for malignant neoplasm of breast: Secondary | ICD-10-CM | POA: Diagnosis not present

## 2021-09-26 DIAGNOSIS — E1159 Type 2 diabetes mellitus with other circulatory complications: Secondary | ICD-10-CM

## 2021-09-26 DIAGNOSIS — I5030 Unspecified diastolic (congestive) heart failure: Secondary | ICD-10-CM

## 2021-09-26 DIAGNOSIS — I10 Essential (primary) hypertension: Secondary | ICD-10-CM

## 2021-09-26 DIAGNOSIS — E1169 Type 2 diabetes mellitus with other specified complication: Secondary | ICD-10-CM

## 2021-09-26 DIAGNOSIS — F17200 Nicotine dependence, unspecified, uncomplicated: Secondary | ICD-10-CM

## 2021-09-26 DIAGNOSIS — I152 Hypertension secondary to endocrine disorders: Secondary | ICD-10-CM

## 2021-09-26 DIAGNOSIS — E785 Hyperlipidemia, unspecified: Secondary | ICD-10-CM

## 2021-09-26 DIAGNOSIS — K5792 Diverticulitis of intestine, part unspecified, without perforation or abscess without bleeding: Secondary | ICD-10-CM

## 2021-09-26 NOTE — Addendum Note (Signed)
Addended by: Gean Birchwood on: 09/26/2021 04:00 PM   Modules accepted: Orders

## 2021-09-30 LAB — CYTOLOGY - PAP
Adequacy: ABSENT
Comment: NEGATIVE
Diagnosis: NEGATIVE
High risk HPV: NEGATIVE

## 2021-10-03 ENCOUNTER — Other Ambulatory Visit: Payer: Self-pay

## 2021-10-03 ENCOUNTER — Encounter: Payer: Self-pay | Admitting: Internal Medicine

## 2021-10-03 ENCOUNTER — Ambulatory Visit (INDEPENDENT_AMBULATORY_CARE_PROVIDER_SITE_OTHER): Payer: Managed Care, Other (non HMO) | Admitting: Internal Medicine

## 2021-10-03 VITALS — BP 122/80 | HR 80 | Ht 66.0 in | Wt 261.0 lb

## 2021-10-03 DIAGNOSIS — E1165 Type 2 diabetes mellitus with hyperglycemia: Secondary | ICD-10-CM | POA: Insufficient documentation

## 2021-10-03 DIAGNOSIS — R739 Hyperglycemia, unspecified: Secondary | ICD-10-CM

## 2021-10-03 DIAGNOSIS — D34 Benign neoplasm of thyroid gland: Secondary | ICD-10-CM

## 2021-10-03 LAB — GLUCOSE, POCT (MANUAL RESULT ENTRY): POC Glucose: 238 mg/dl — AB (ref 70–99)

## 2021-10-03 MED ORDER — DAPAGLIFLOZIN PROPANEDIOL 5 MG PO TABS: 5.0000 mg | ORAL_TABLET | Freq: Every day | ORAL | 6 refills | Status: DC

## 2021-10-03 NOTE — Progress Notes (Signed)
Name: Lisa [name]  Age/ Sex: 60 y.o., female   MRN/ DOB: [id number], 01-24-1961     PCP: Marin Olp, MD   Reason for Endocrinology Evaluation: Type 2 Diabetes Mellitus/ Abnormal FNA  Initial Endocrine Consultative Visit: 05/23/2021    PATIENT IDENTIFIER: Lisa [name] is a 60 y.o. female with a past medical history of HTN, Migraine headaches, A1OI, diastolic dysfunction and PAD. The patient has followed with Endocrinology clinic since 05/23/2021 for consultative assistance with management of her diabetes.      DIABETIC HISTORY:  Lisa [name] was diagnosed with DM 02/2021. Was on Metformin but was stopped due to low GFR.Ozempic caused GI side effects . Her hemoglobin A1c has ranged from 6.5% in 02/2021, peaking at 9.5% in 08/2021.   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     SUBJECTIVE:     Today (10/03/2021): Lisa [name] is here for a follow up on diabetes management and MNG.  She checks her blood sugars 1 times daily. The patient has not had hypoglycemic episodes since the last clinic visit.  She admits to dietary indiscretions over August and September but since her A1c was noted to be high  Had a bout of diverticulitis , took Abx  She was on Ozempic but was having GI issues    HOME DIABETES REGIMEN:  N/a   Statin: no, she is on Praluent  ACE-I/ARB: yes Prior Diabetic Education: No   METER DOWNLOAD SUMMARY: Date range evaluated: unable to download  118 -220 MG/DL   DIABETIC COMPLICATIONS:  Macrovascular complications:   Denies: CAD, CVA,  PVD   HISTORY:  Past Medical History:  Past Medical History:  Diagnosis Date   Allergic rhinitis    Arthritis    Asthma    Breast nodule    right breast, to see dr Helane Rima 06-20-2012 for    Chest pain, non-cardiac    Chronic headaches    Cigarette smoker    Complication of anesthesia 05-17-2012   trouble breathing after colonscopy, needed nebulizer   COPD (chronic obstructive pulmonary disease) ([place])    Diastolic congestive heart failure ([place]) 04/17/2019   Diverticulosis    GERD (gastroesophageal reflux disease)    Hepatic hemangioma    Hypertension    IBS (irritable bowel syndrome)    Nuclear stress test    Myoview 05/2019: EF 63, no ischemia. Low Risk   Obesity    Prosthetic eye globe    Pyloric erosion    Sleep apnea    CPAP setting varies from 4-10   Type 2 diabetes mellitus ([place]) 03/11/2017   Past Surgical History:  Past Surgical History:  Procedure Laterality Date   ABDOMINAL AORTOGRAM W/LOWER EXTREMITY N/A 04/30/2017   Procedure: Abdominal Aortogram w/Lower Extremity;  Surgeon: Elam Dutch, MD;  Location: [facility];  Service: Cardiovascular;  Laterality: N/A;   CARDIAC CATHETERIZATION  12/28/2007   EF 75%. IT REVEALS NORMAL/SUPRANORMAL LEFT VENTRICULAR SYSTOLIC FUNCTION   CARDIOVASCULAR STRESS TEST  03/25/2007   EF 78%   CESAREAN SECTION  2003, 2009   x 2   CHOLECYSTECTOMY  1993  COLONOSCOPY  2014   ENDOMETRIAL ABLATION  09/2010   and D&C   HERNIA REPAIR  2878   umbilical   LAPAROSCOPY     PERIPHERAL VASCULAR BALLOON ANGIOPLASTY Left 04/30/2017   Procedure: Peripheral Vascular Balloon Angioplasty;  Surgeon: Elam Dutch, MD;  Location: [facility];  Service: Cardiovascular;  Laterality: Left;  SFA   RECONSTRUCTION OF EYELID Bilateral    TUBAL LIGATION     UMBILICAL HERNIA REPAIR  2004   US ECHOCARDIOGRAPHY  05/08/2008   EF 55-60%   VENTRAL HERNIA REPAIR  06/15/2012   Procedure: LAPAROSCOPIC VENTRAL HERNIA;  Surgeon: Edward Jolly,  MD;  Location: [facility];  Service: General;  Laterality: N/A;  LAPAROSCOPIC REPAIR VENTRAL HERNIA   VIDEO BRONCHOSCOPY Bilateral 11/15/2017   Procedure: VIDEO BRONCHOSCOPY WITH FLUORO;  Surgeon: Collene Gobble, MD;  Location: [facility];  Service: Cardiopulmonary;  Laterality: Bilateral;   Social History:  reports that she has been smoking cigarettes. She has a 57.00 pack-year smoking history. She has never used smokeless tobacco. She reports that she does not drink alcohol and does not use drugs. Family History:  Family History  Problem Relation Age of Onset   Hypertension Mother    Lung cancer Mother        65   Diabetes Maternal Grandmother    Heart disease Father        pacemaker or defibrillator   Heart attack Paternal Uncle    Breast cancer Other    Colon cancer Neg Hx    Esophageal cancer Neg Hx    Pancreatic cancer Neg Hx      HOME MEDICATIONS: Allergies as of 10/03/2021       Reactions   Almond Oil Anaphylaxis, Nausea And Vomiting   Almond-Nuts Only   Apple Anaphylaxis   Ceftin [cefuroxime] Other (See Comments)   Severe stomach pain - Diverticulitis flares up   Cherry Extract Anaphylaxis   Cherry fruit only   Other Anaphylaxis, Itching   Most fruits- Are NOT tolerated (PLEASE ASK PATIENT BEFORE GIVING, AS CERTAIN FRUITS ARE TOLERATED IN LIMITED QUANTITIES!!)   Guaifenesin & Derivatives Other (See Comments)   Restlessness, jittery   Hctz [hydrochlorothiazide] Other (See Comments)   Causes a headache   Naproxen Other (See Comments)   Insomnia   Lisinopril Cough           Medication List        Accurate as of October 03, 2021  4:25 PM. If you have any questions, ask your nurse or doctor.          ASPIRIN 81 PO Take 81 mg by mouth daily.   buPROPion 150 MG 12 hr tablet Commonly known as: WELLBUTRIN SR Take 150 mg by mouth daily.   carvedilol 25 MG tablet Commonly known as: COREG Take 1 tablet (25 mg total) by mouth 2 (two) times daily.    dapagliflozin propanediol 5 MG Tabs tablet Commonly known as: Farxiga Take 1 tablet (5 mg total) by mouth daily before breakfast. Started by: Dorita Sciara, MD   dicyclomine 10 MG capsule Commonly known as: BENTYL Take 1 capsule (10 mg total) by mouth 3 (three) times daily before meals. As needed   Nexlizet 180-10 MG Tabs Generic drug: Bempedoic Acid-Ezetimibe TAKE 1 TABLET BY MOUTH EVERY DAY   ONE TOUCH LANCETS Misc Use to check blood sugars twice a day   pantoprazole 40 MG tablet Commonly known as: PROTONIX Take 1 tablet by mouth 30 mins  before breakfast daily.   Praluent 75 MG/ML [place] Generic drug: Alirocumab INJECT 1 PEN INTO THE SKIN EVERY 14 (FOURTEEN) DAYS.   spironolactone 25 MG tablet Commonly known as: ALDACTONE TAKE 1 TABLET BY MOUTH EVERY DAY   telmisartan 40 MG tablet Commonly known as: Micardis Take 1 tablet (40 mg total) by mouth daily.         OBJECTIVE:   Vital Signs: BP 122/80 (BP Location: Left Arm, Patient Position: Sitting, Cuff Size: Large)   Pulse 80   Ht 5\' 6"  (1.676 m)   Wt 261 lb (118.4 kg)   SpO2 97%   BMI 42.13 kg/m   Wt Readings from Last 3 Encounters:  10/03/21 261 lb (118.4 kg)  09/26/21 257 lb 9.6 oz (116.8 kg)  09/16/21 261 lb 9.6 oz (118.7 kg)     Exam: General: Pt appears well and is in NAD  Neck: General: Supple without adenopathy. Thyroid: Thyroid size normal.  Bilateral thyroid nodules appreciated  Lungs: Clear with good BS bilat with no rales, rhonchi, or wheezes  Heart: RRR   Extremities: No pretibial edema.   Neuro: MS is good with appropriate affect, pt is alert and Ox3          DATA REVIEWED:  Lab Results  Component Value Date   HGBA1C 9.5 (H) 09/08/2021   HGBA1C 6.5 03/21/2021   HGBA1C 5.9 (H) 09/12/2020   Lab Results  Component Value Date   LDLCALC 57 06/19/2020   CREATININE 1.49 (H) 09/08/2021   Lab Results  Component Value Date   MICRALBCREAT 28.6 04/22/2017     Lab Results   Component Value Date   CHOL 147 09/08/2021   HDL 23.50 (L) 09/08/2021   LDLCALC 57 06/19/2020   LDLDIRECT 89.0 09/08/2021   TRIG 392.0 (H) 09/08/2021   CHOLHDL 6 09/08/2021       FNA 04/22/2021   Clinical History: Left; Inferior 2.6cm; Other 2 dimensions: 2.4 x 2.0cm,  Solid /almost completely solid, Hypoechoic, TI-RADS total points 4.  Specimen Submitted:  A. 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 Goal A1c < 7.0 %.    -Poorly controlled diabetes due to dietary indiscretions, patient admits that during the summer she was eating high starch diet and consuming sugar sweetened beverages.  Since seeing how high her A1c has been she had made changes to her eating and drinking habits.  She has stopped using sugar sweetened beverages. -She has been on metformin in the past, I suspect this has been discontinued due to low GFR -She has been on Ozempic but this caused GI issues with diarrhea, of note the patient has history of diverticulosis, she was recently treated for an episode of diverticulitis -We discussed that with her CKD III , oral glycemic agents are becoming limited, she did agree to trying SGLT2 inhibitors, we did discuss the benefit as well as the risk of genital infections, I have encouraged her to stay hydrated -She was also encouraged to check glucose at home -She would like to avoid starting insulin at all cost -We also discussed sulfonylureas as an option, but she would like to avoid this due to risk of weight gain -She declines a  referral to our CDE as she just started a new job and does not have enough accumulated time  MEDICATIONS: Start Farxiga 5 mg, 1 tablet every  morning  EDUCATION / INSTRUCTIONS: BG monitoring instructions: Patient is instructed to check her blood sugars 1 times a day, fasting. Call [facility] if: BG persistently < 70 I reviewed the Rule of 15 for the treatment of hypoglycemia in detail with the patient. Literature supplied.     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  - We discussed that per ATA guidelines, diagnostic lobectomy is NOT necessary and a 12 month follow up is recommended     F/U in 3 months   Signed electronically by: Mack Guise, MD  [facility]  [address] [place],  [address] Phone: [phone] FAX: [phone]   CC: Marin Olp, [address] [place] [address] Phone: [phone]  Fax: [phone]  Return to Endocrinology clinic as below: Future Appointments  Date Time Provider [place]  11/03/2021  3:00 PM Magdalen Spatz, NP [facility] None  11/03/2021  4:30 PM [facility]  03/24/2022  1:00 PM Marin Olp, MD [facility]

## 2021-10-03 NOTE — Patient Instructions (Addendum)
-   Start Farxiga 5 mg , 1 tablet every morning

## 2021-11-03 ENCOUNTER — Ambulatory Visit (INDEPENDENT_AMBULATORY_CARE_PROVIDER_SITE_OTHER)
Admission: RE | Admit: 2021-11-03 | Discharge: 2021-11-03 | Disposition: A | Discharge status: Home / Self Care | Payer: Managed Care, Other (non HMO) | Source: Ambulatory Visit | Attending: Family Medicine | Admitting: Family Medicine

## 2021-11-03 ENCOUNTER — Encounter: Payer: Self-pay | Admitting: Acute Care

## 2021-11-03 ENCOUNTER — Telehealth (INDEPENDENT_AMBULATORY_CARE_PROVIDER_SITE_OTHER): Payer: Managed Care, Other (non HMO) | Admitting: Acute Care

## 2021-11-03 ENCOUNTER — Other Ambulatory Visit: Payer: Self-pay

## 2021-11-03 DIAGNOSIS — F17210 Nicotine dependence, cigarettes, uncomplicated: Secondary | ICD-10-CM

## 2021-11-03 NOTE — Progress Notes (Signed)
Virtual Visit via Video Note  I connected with DONNIE GEDEON on 11/03/21 at  3:00 PM EST by a video enabled telemedicine application and verified that I am speaking with the correct person using two identifiers.  Location: Patient: At home Provider: [place], [place], [place], [address]    I discussed the limitations of evaluation and management by telemedicine and the availability of in person appointments. The patient expressed understanding and agreed to proceed.  Shared Decision Making Visit Lung Cancer Screening Program [phone])   Eligibility: Age 60 y.o. Pack Years Smoking History Calculation 60 pack year smoking history (# packs/per year x # years smoked) Recent History of coughing up blood  no Unexplained weight loss? no ( >Than 15 pounds within the last 6 months ) Prior History Lung / other cancer no (Diagnosis within the last 5 years already requiring surveillance chest CT Scans). Smoking Status Current Smoker Former Smokers: Years since quit:  NA  Quit Date: NA  Visit Components: Discussion included one or more decision making aids. yes Discussion included risk/benefits of screening. yes Discussion included potential follow up diagnostic testing for abnormal scans. yes Discussion included meaning and risk of over diagnosis. yes Discussion included meaning and risk of False Positives. yes Discussion included meaning of total radiation exposure. yes  Counseling Included: Importance of adherence to annual lung cancer LDCT screening. yes Impact of comorbidities on ability to participate in the program. yes Ability and willingness to under diagnostic treatment. yes  Smoking Cessation Counseling: Current Smokers:  Discussed importance of smoking cessation. yes Information about tobacco cessation classes and interventions provided to patient. yes Patient provided with "ticket" for LDCT Scan. yes Symptomatic Patient. no  Counseling NA Diagnosis Code:  Tobacco Use Z72.0 Asymptomatic Patient yes  Counseling (Intermediate counseling: > three minutes counseling) C6237 Former Smokers:  Discussed the importance of maintaining cigarette abstinence. yes Diagnosis Code: Personal History of Nicotine Dependence. S28.315 Information about tobacco cessation classes and interventions provided to patient. Yes Patient provided with "ticket" for LDCT Scan. yes Written Order for Lung Cancer Screening with LDCT placed in Epic. Yes (CT Chest Lung Cancer Screening Low Dose W/O CM) [id number] Z12.2-Screening of respiratory organs Z87.891-Personal history of nicotine dependence  I have spent 25 minutes of face to face/ virtual visit   time with Ms. Bies discussing the risks and benefits of lung cancer screening. We viewed / discussed a power point together that explained in detail the above noted topics. We paused at intervals to allow for questions to be asked and answered to ensure understanding.We discussed that the single most powerful action that she can take to decrease her risk of developing lung cancer is to quit smoking. We discussed whether or not she is ready to commit to setting a quit date. We discussed options for tools to aid in quitting smoking including nicotine replacement therapy, non-nicotine medications, support groups, Quit Smart classes, and behavior modification. We discussed that often times setting smaller, more achievable goals, such as eliminating 1 cigarette a day for a week and then 2 cigarettes a day for a week can be helpful in slowly decreasing the number of cigarettes smoked. This allows for a sense of accomplishment as well as providing a clinical benefit. I provided  her  with smoking cessation  information  with contact information for community resources, classes, free nicotine replacement therapy, and access to mobile apps, text messaging, and on-line smoking cessation help. I have also provided  her  the office contact  information in  the event she needs to contact me, or the screening staff. We discussed the time and location of the scan, and that either Doroteo Glassman RN, Joella Prince, RN  or I will call / send a letter with the results within 24-72 hours of receiving them. The patient verbalized understanding of all of  the above and had no further questions upon leaving the office. They have my contact information in the event they have any further questions.  I spent 3 minutes counseling on smoking cessation and the health risks of continued tobacco abuse.  I explained to the patient that there has been a high incidence of coronary artery disease noted on these exams. I explained that this is a non-gated exam therefore degree or severity cannot be determined. This patient is not on statin therapy. I have asked the patient to follow-up with their PCP regarding any incidental finding of coronary artery disease and management with diet or medication as their PCP  feels is clinically indicated. The patient verbalized understanding of the above and had no further questions upon completion of the visit.   Pt. Is interested in smoking cessation classes provided 11/2021 by Dr. Clyde Lundborg, NP 11/03/2021

## 2021-11-03 NOTE — Patient Instructions (Signed)
Thank you for participating in the [facility] Screening Program. °It was our pleasure to meet you today. °We will call you with the results of your scan within the next few days. °Your scan will be assigned a Lung RADS category score by the physicians reading the scans.  °This Lung RADS score determines follow up scanning.  °See below for description of categories, and follow up screening recommendations. °We will be in touch to schedule your follow up screening annually or based on recommendations of our providers. °We will fax a copy of your scan results to your Primary Care Physician, or the physician who referred you to the program, to ensure they have the results. °Please call the office if you have any questions or concerns regarding your scanning experience or results.  °Our office number is [phone]. °Please speak with Denise Phelps, RN. She is our Lung Cancer Screening RN. °If she is unavailable when you call, please have the office staff send her a message. She will return your call at her earliest convenience. °Remember, if your scan is normal, we will scan you annually as long as you continue to meet the criteria for the program. (Age 55-77, Current smoker or smoker who has quit within the last 15 years). °If you are a smoker, remember, quitting is the single most powerful action that you can take to decrease your risk of lung cancer and other pulmonary, breathing related problems. °We know quitting is hard, and we are here to help.  °Please let us know if there is anything we can do to help you meet your goal of quitting. °If you are a former smoker, congratulations. We are proud of you! Remain smoke free! °Remember you can refer friends or family members through the number above.  °We will screen them to make sure they meet criteria for the program. °Thank you for helping us take better care of you by participating in Lung Screening. ° °You can receive free nicotine replacement therapy  ( patches, gum or mints) by calling 1-800-QUIT NOW. Please call so we can get you on the path to becoming  a non-smoker. I know it is hard, but you can do this! ° °Lung RADS Categories: ° °Lung RADS 1: no nodules or definitely non-concerning nodules.  °Recommendation is for a repeat annual scan in 12 months. ° °Lung RADS 2:  nodules that are non-concerning in appearance and behavior with a very low likelihood of becoming an active cancer. °Recommendation is for a repeat annual scan in 12 months. ° °Lung RADS 3: nodules that are probably non-concerning , includes nodules with a low likelihood of becoming an active cancer.  Recommendation is for a 6-month repeat screening scan. Often noted after an upper respiratory illness. We will be in touch to make sure you have no questions, and to schedule your 6-month scan. ° °Lung RADS 4 A: nodules with concerning findings, recommendation is most often for a follow up scan in 3 months or additional testing based on our provider's assessment of the scan. We will be in touch to make sure you have no questions and to schedule the recommended 3 month follow up scan. ° °Lung RADS 4 B:  indicates findings that are concerning. We will be in touch with you to schedule additional diagnostic testing based on our provider's  assessment of the scan. ° °Hypnosis for smoking cessation  °[employer] °[phone] ° °Acupuncture for smoking cessation  °[facility] °[phone]  °

## 2021-11-05 ENCOUNTER — Encounter: Payer: Self-pay | Admitting: Family Medicine

## 2021-11-14 ENCOUNTER — Ambulatory Visit: Payer: Managed Care, Other (non HMO) | Admitting: Internal Medicine

## 2021-12-03 ENCOUNTER — Other Ambulatory Visit: Payer: Self-pay

## 2021-12-03 DIAGNOSIS — F17210 Nicotine dependence, cigarettes, uncomplicated: Secondary | ICD-10-CM

## 2021-12-03 DIAGNOSIS — Z87891 Personal history of nicotine dependence: Secondary | ICD-10-CM

## 2021-12-06 ENCOUNTER — Other Ambulatory Visit: Payer: Self-pay | Admitting: Nurse Practitioner

## 2022-01-13 ENCOUNTER — Encounter: Payer: Self-pay | Admitting: Family Medicine

## 2022-01-13 ENCOUNTER — Telehealth (INDEPENDENT_AMBULATORY_CARE_PROVIDER_SITE_OTHER): Payer: Managed Care, Other (non HMO) | Admitting: Family Medicine

## 2022-01-13 ENCOUNTER — Other Ambulatory Visit: Payer: Managed Care, Other (non HMO)

## 2022-01-13 VITALS — Ht 66.0 in | Wt 256.0 lb

## 2022-01-13 DIAGNOSIS — E1165 Type 2 diabetes mellitus with hyperglycemia: Secondary | ICD-10-CM

## 2022-01-13 DIAGNOSIS — R350 Frequency of micturition: Secondary | ICD-10-CM | POA: Diagnosis not present

## 2022-01-13 DIAGNOSIS — I10 Essential (primary) hypertension: Secondary | ICD-10-CM

## 2022-01-13 NOTE — Progress Notes (Signed)
Phone [phone] Virtual visit via Video note   Subjective:  Chief complaint: Chief Complaint  Patient presents with   Bladder Issues    Pt c/o butter popcorn smell with foam that she notices daily. The scent is coming through her clothes and is getting embarrassing with frequent urination that started 2 weeks ago.    This visit type was conducted due to national recommendations for restrictions regarding the COVID-19 Pandemic (e.g. social distancing).  This format is felt to be most appropriate for this patient at this time balancing risks to patient and risks to population by having him in for in person visit.  No physical exam was performed (except for noted visual exam or audio findings with Telehealth visits).    Our team/I connected with [name] at 11:40 AM EST by a video enabled telemedicine application (doxy.me or caregility through epic) and verified that I am speaking with the correct person using two identifiers.  Location patient: [facility] Location provider: [facility], office Persons participating in the virtual visit:  patient  Our team/I discussed the limitations of evaluation and management by telemedicine and the availability of in person appointments. In light of current covid-19 pandemic, patient also understands that we are trying to protect them by minimizing in office contact if at all possible.  The patient expressed consent for telemedicine visit and agreed to proceed. Patient understands insurance will be billed.   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[place]) 01/14/2017    Priority: High   Morbid obesity ([place]) 02/14/2016    Priority: High   Tobacco  use disorder 08/12/2015    Priority: High   COPD (chronic obstructive pulmonary disease) ([place]) 01/18/2015    Priority: High   CKD (chronic kidney disease), stage III ([place]) 09/18/2020    Priority: Medium    Fatty liver 11/06/2019    Priority: Medium    Hyperlipidemia 04/12/2018    Priority: Medium    Hyperlipidemia associated with type 2 diabetes mellitus ([place]) 09/06/2017    Priority: Medium    Breast mass 08/03/2017    Priority: Medium    GERD (gastroesophageal reflux disease) 02/14/2016    Priority: Medium    DOE (dyspnea on exertion) 03/08/2014    Priority: Medium    Migraine without aura 07/26/2013    Priority: Medium    Sleep apnea 02/11/2012    Priority: Medium    Chest discomfort 10/12/2011    Priority: Medium    Essential hypertension 10/12/2011    Priority: Medium    Chronic hip pain 07/29/2017    Priority: Low   Left knee pain 05/15/2016    Priority: Low   Flank pain 04/21/2016    Priority: Low   Abdominal pain 02/28/2016    Priority: Low   Pulmonary nodule, right 04/11/2015    Priority: Low   Mediastinal lymphadenopathy 01/18/2015    Priority: Low   Aortic atherosclerosis ([place]) 09/10/2021    Priority: 2.   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     buPROPion (WELLBUTRIN SR) 150 MG 12 hr tablet Take 150 mg by mouth daily.     carvedilol (COREG) 25 MG tablet Take 1 tablet (25 mg total) by mouth 2 (two) times daily. 180 tablet 3   dapagliflozin propanediol (FARXIGA) 5 MG TABS tablet Take 1 tablet (5  mg total) by mouth daily before breakfast. 30 tablet 6   dicyclomine (BENTYL) 10 MG capsule Take 1 capsule (10 mg total) by mouth 3 (three) times daily before meals. As needed 270 capsule 3   NEXLIZET 180-10 MG TABS TAKE 1 TABLET BY MOUTH EVERY DAY 90 tablet 3   ONE TOUCH LANCETS MISC Use to check blood sugars twice a day 200 each 0   pantoprazole (PROTONIX) 40 MG tablet TAKE 1 TABLET BY MOUTH 30 MINS BEFORE BREAKFAST DAILY. 90 tablet 1   PRALUENT 75 MG/ML SOAJ INJECT 1 PEN INTO THE SKIN EVERY 14 (FOURTEEN) DAYS. 6 mL 3   spironolactone (ALDACTONE) 25 MG tablet TAKE 1 TABLET BY MOUTH EVERY DAY 90 tablet 1   telmisartan (MICARDIS) 40 MG tablet Take 1 tablet (40 mg total) by mouth daily. 90 tablet 3   No current facility-administered medications for this visit.     Objective:  Ht 5\' 6"  (1.676 m)    Wt 256 lb (116.1 kg)    BMI 41.32 kg/m  self reported vitals Gen: NAD, resting comfortably Lungs: nonlabored, normal respiratory rate  Skin: appears dry, no obvious rash     Assessment and Plan   #Bladder concerns S:From mychart message 11/05/21 "I have a simple, quick question, when I go to the bathroom to urinate, the urine smells like a either butter popcorn or a hard taco shell with very minimal bubbles.  Can you figure what's going on with that?  It's not everyday but it was pretty strong today; no dark urine, it is actually pale yellow or just about clear.  Is there something going on with my bladder or is it diabetes-related.  It really started when I started my water regimen, I now drink at least 64 oz. of water per day, everyday.  Is there something I need to do to improve this situation?"  Has started to notice some frequency about 2 weeks- more consistent popcorn smell and more bubbly but not terribly so. No bubles when uses bathroom at home. She is staying well hydrated. Sugar was 115 when recently checked and overall doing better.   She states she started to eat less healthy and that  didn't help so went back to healtheir eating.   No vaginal discharge. Occasional incontinence due to urgency. Early slight lower abdominal pain has had diverticulitis before and she should she start antibiotics- advised needed in office visit for this portion.  No fever reported  A/P: Patient with 2 weeks of increased urinary frequency, more malodorous urine, urinary urgency concerning for potential UTI-she will come by for urine culture and urinalysis.  She reports blood sugars typically well controlled but I also worry based on last A1c about this-we will also get updated A1c though she follows with endocrinology for this -She did not report concern about diverticulitis on scheduling virtual visit-discussed patient will need in person evaluation for this  if symptoms persist-right now just some mild lower abdominal pain that also can go along with UTI  # Diabetes S: Medication: Farxiga 5 mg on medication list.  Prior Ozempic was discontinued Lab Results  Component Value Date   HGBA1C 9.5 (H) 09/08/2021   HGBA1C 6.5 03/21/2021   HGBA1C 5.9 (H) 09/12/2020   A/P: Prior poor control but hopefully improving-she will come by for A1c and strongly encouraged her to schedule endocrinology visit-last seen 10/03/2021 with plan for 63-month follow-up but she has not scheduled this yet and is already passed that day  #hypertension S: medication: Spironolactone 25 mg, telmisartan 40 mg BP Readings from Last 3 Encounters:  10/03/21 122/80  09/26/21 127/79  09/16/21 138/78  A/P: Previously well controlled-no reading for today-present well-controlled-continue current medication but we will use diagnosis to update labs  Recommended follow up: Keep April visit Future Appointments  Date Time Provider [place]  01/13/2022  2:00 PM [facility] LAB [facility]  01/15/2022  3:00 PM Gregor Hams, MD [facility] None  03/24/2022  1:00 PM Marin Olp, MD [facility]    Lab/Order associations:    ICD-10-CM   1. Frequent urination  R35.0 POCT Urinalysis Dipstick (Automated)    Urine Culture    POCT Urinalysis Dipstick (Automated)    Urine Culture    CBC with Differential/Platelet    CANCELED: CBC    2. Type 2 diabetes mellitus with hyperglycemia, without long-term current use of insulin (HCC)  E11.65 Comprehensive metabolic panel    Hemoglobin A1c    3. Essential hypertension  I10      Return precautions advised.  Garret Reddish, MD

## 2022-01-15 ENCOUNTER — Ambulatory Visit: Payer: Managed Care, Other (non HMO) | Admitting: Family Medicine

## 2022-01-15 ENCOUNTER — Ambulatory Visit (INDEPENDENT_AMBULATORY_CARE_PROVIDER_SITE_OTHER): Payer: Managed Care, Other (non HMO)

## 2022-01-15 ENCOUNTER — Ambulatory Visit (INDEPENDENT_AMBULATORY_CARE_PROVIDER_SITE_OTHER): Payer: Managed Care, Other (non HMO) | Admitting: Family Medicine

## 2022-01-15 ENCOUNTER — Other Ambulatory Visit (INDEPENDENT_AMBULATORY_CARE_PROVIDER_SITE_OTHER): Payer: Managed Care, Other (non HMO)

## 2022-01-15 ENCOUNTER — Other Ambulatory Visit: Payer: Self-pay

## 2022-01-15 ENCOUNTER — Encounter: Payer: Self-pay | Admitting: Family Medicine

## 2022-01-15 VITALS — BP 120/74 | HR 85 | Ht 66.0 in | Wt 256.2 lb

## 2022-01-15 DIAGNOSIS — R350 Frequency of micturition: Secondary | ICD-10-CM

## 2022-01-15 DIAGNOSIS — M5441 Lumbago with sciatica, right side: Secondary | ICD-10-CM

## 2022-01-15 DIAGNOSIS — G8929 Other chronic pain: Secondary | ICD-10-CM | POA: Diagnosis not present

## 2022-01-15 DIAGNOSIS — M25552 Pain in left hip: Secondary | ICD-10-CM | POA: Diagnosis not present

## 2022-01-15 DIAGNOSIS — M25551 Pain in right hip: Secondary | ICD-10-CM | POA: Diagnosis not present

## 2022-01-15 DIAGNOSIS — E1165 Type 2 diabetes mellitus with hyperglycemia: Secondary | ICD-10-CM | POA: Diagnosis not present

## 2022-01-15 LAB — CBC WITH DIFFERENTIAL/PLATELET
Basophils Absolute: 0 10*3/uL (ref 0.0–0.1)
Basophils Relative: 0.7 % (ref 0.0–3.0)
Eosinophils Absolute: 0.2 10*3/uL (ref 0.0–0.7)
Eosinophils Relative: 2.2 % (ref 0.0–5.0)
HCT: 42.7 % (ref 36.0–46.0)
Hemoglobin: 14.3 g/dL (ref 12.0–15.0)
Lymphocytes Relative: 30.4 % (ref 12.0–46.0)
Lymphs Abs: 2.2 10*3/uL (ref 0.7–4.0)
MCHC: 33.6 g/dL (ref 30.0–36.0)
MCV: 89.3 fl (ref 78.0–100.0)
Monocytes Absolute: 0.6 10*3/uL (ref 0.1–1.0)
Monocytes Relative: 8.1 % (ref 3.0–12.0)
Neutro Abs: 4.1 10*3/uL (ref 1.4–7.7)
Neutrophils Relative %: 58.6 % (ref 43.0–77.0)
Platelets: 275 10*3/uL (ref 150.0–400.0)
RBC: 4.78 Mil/uL (ref 3.87–5.11)
RDW: 13.3 % (ref 11.5–15.5)
WBC: 7.1 10*3/uL (ref 4.0–10.5)

## 2022-01-15 LAB — COMPREHENSIVE METABOLIC PANEL
ALT: 22 U/L (ref 0–35)
AST: 14 U/L (ref 0–37)
Albumin: 4.1 g/dL (ref 3.5–5.2)
Alkaline Phosphatase: 63 U/L (ref 39–117)
BUN: 25 mg/dL — ABNORMAL HIGH (ref 6–23)
CO2: 32 mEq/L (ref 19–32)
Calcium: 9.9 mg/dL (ref 8.4–10.5)
Chloride: 102 mEq/L (ref 96–112)
Creatinine, Ser: 1.22 mg/dL — ABNORMAL HIGH (ref 0.40–1.20)
GFR: 48.97 mL/min — ABNORMAL LOW (ref >60.00)
Glucose, Bld: 225 mg/dL — ABNORMAL HIGH (ref 70–99)
Potassium: 4.2 mEq/L (ref 3.5–5.1)
Sodium: 137 mEq/L (ref 135–145)
Total Bilirubin: 0.4 mg/dL (ref 0.2–1.2)
Total Protein: 7.1 g/dL (ref 6.0–8.3)

## 2022-01-15 LAB — POC URINALSYSI DIPSTICK (AUTOMATED)
Bilirubin, UA: NEGATIVE
Blood, UA: POSITIVE
Glucose, UA: NEGATIVE
Ketones, UA: NEGATIVE
Leukocytes, UA: NEGATIVE
Nitrite, UA: NEGATIVE
Protein, UA: NEGATIVE
Spec Grav, UA: 1.025 (ref 1.010–1.025)
Urobilinogen, UA: 0.2 E.U./dL
pH, UA: 6 (ref 5.0–8.0)

## 2022-01-15 LAB — HEMOGLOBIN A1C: Hgb A1c MFr Bld: 8.2 % — ABNORMAL HIGH (ref 4.6–6.5)

## 2022-01-15 MED ORDER — TIZANIDINE HCL 4 MG PO TABS: 4.0000 mg | ORAL_TABLET | Freq: Three times a day (TID) | ORAL | 1 refills | Status: DC | PRN

## 2022-01-15 NOTE — Progress Notes (Signed)
I, [name], LAT, ATC, am serving as scribe for Dr. [name].  Lisa [name] is a 61 y.o. female who presents to [facility] today for bilat hip pain. Pt was previously seen by Dr.Rayshaun Needle on 08/18/21 L Achilles tendinitis.   Pt has hx of LBP and B hip pain, having been seen by Dr. Durward Fortes for these c/o previously in 2019 and had both a R and L L4-5 ESI in 2020.  Today, pt c/o bilat hip pain, R>L, x 2-3 weeks. Pt locates pain to her B lateral hips, R>L.  She has radiating pain into her R leg from her R lateral hip to her R lateral lower leg and calf.  She denies any numbness/tingling into her legs.  She does have some catching in her L hip.  Aggravating factors include laying on her side and sitting.    Diagnostic testing: R hip and L-spine XR- 01/15/22; L-spine MRI- 03/28/19; L-spine and B hip XR- 10/07/2018  Pertinent review of systems: No fevers or chills  Relevant historical information: Peripheral arterial disease.  GERD.   Exam:  BP 120/74 (BP Location: Right Arm, Patient Position: Sitting, Cuff Size: Large)    Pulse 85    Ht 5\' 6"  (1.676 m)    Wt 256 lb 3.2 oz (116.2 kg)    SpO2 95%    BMI 41.35 kg/m  General: Well Developed, well nourished, and in no acute distress.   MSK: Spine: Nontender to midline.  Normal lumbar motion.  Positive right-sided straight leg raise test and slump test. Lower extremity strength is intact except noted below. Reflexes and sensation are intact distally.  Right hip normal appearing. Tender palpation right greater trochanter. Hip abduction and external rotation strength are diminished. Normal range of motion.  Left hip normal-appearing. Full motion. Abduction and external rotation strength slightly diminished.    Lab and Radiology Results  X-ray images right hip and lumbar spine obtained today personally and independently interpreted  Right hip: No severe degenerative changes. Enthesiopathy changes right  greater trochanter.  None at left. No acute fractures.  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  No acute fractures.  Await formal radiology review     Assessment and Plan: 61 y.o. female with right lateral hip pain with pain radiating down the right leg to the posterior calf to the lateral calf.  Pain is likely multifactorial.  I think the pain is due to greater trochanteric bursitis and likely L5 radiculopathy.  However she is already had treatment for this with a greater trochanteric injection and an epidural steroid injection previously.  This did not provide significant benefit.  After discussion today plan for trial of physical therapy.  Additionally tizanidine at bedtime as she is having trouble sleeping.  Plan to check back in 6 weeks.  If not improved consider MRI lumbar spine or hip may be repeat injection.    PDMP not reviewed this encounter. Orders Placed This Encounter  Procedures   DG Lumbar Spine Complete    Standing Status:   Future    Number of Occurrences:   1    Standing Expiration Date:   02/12/2022    Order Specific Question:   Reason for Exam (SYMPTOM  OR DIAGNOSIS REQUIRED)    Answer:   low back pain    Order Specific Question:   Is patient pregnant?    Answer:   No    Order Specific Question:   Preferred imaging location?  Answer:   [facility]   DG HIP UNILAT W OR W/O PELVIS 2-3 VIEWS RIGHT    Standing Status:   Future    Number of Occurrences:   1    Standing Expiration Date:   02/12/2022    Order Specific Question:   Reason for Exam (SYMPTOM  OR DIAGNOSIS REQUIRED)    Answer:   R hip pain    Order Specific Question:   Is patient pregnant?    Answer:   No    Order Specific Question:   Preferred imaging location?    Answer:   [facility]   Ambulatory referral to Physical Therapy    Referral Priority:   Routine    Referral Type:   Physical Medicine    Referral Reason:   Specialty Services Required    Requested Specialty:   Physical  Therapy    Number of Visits Requested:   1   Meds ordered this encounter  Medications   tiZANidine (ZANAFLEX) 4 MG tablet    Sig: Take 1 tablet (4 mg total) by mouth every 8 (eight) hours as needed for muscle spasms.    Dispense:  30 tablet    Refill:  1     Discussed warning signs or symptoms. Please see discharge instructions. Patient expresses understanding.   The above documentation has been reviewed and is accurate and complete [name], M.D.

## 2022-01-15 NOTE — Patient Instructions (Addendum)
Good to see you today.  I've referred you to PT at [facility].  Their office will call you to scheduled but please let us know if you haven't heard from them in one week regarding scheduling.  I've prescribed you Tizanidine to take at bedtime.  Follow-up: 6 weeks

## 2022-01-17 LAB — URINE CULTURE
MICRO NUMBER:: 13024164
SPECIMEN QUALITY:: ADEQUATE

## 2022-01-19 NOTE — Progress Notes (Signed)
Lumbar spine x-ray shows some arthritis changes.  No fractures are visible.

## 2022-01-19 NOTE — Progress Notes (Signed)
Right hip x-ray does not show any fractures or significant hip arthritis.

## 2022-01-26 ENCOUNTER — Telehealth: Payer: Self-pay | Admitting: Nurse Practitioner

## 2022-01-26 ENCOUNTER — Telehealth: Payer: Managed Care, Other (non HMO) | Admitting: Family Medicine

## 2022-01-26 NOTE — Telephone Encounter (Signed)
Patient is calling with complaints of abdominal pain since 01/16/22. She thought it was due to constipation. She increased her water intake and her fiber intake. She did not feel better. She then started taking oil twice daily. That helped but she started having loose stools. She decreased it to once daily. Her pain was middle to the left abdominal with tenderness to push on it. Now her pain is "pain from hell" low abdomen "above the hair line." It hurts to sit. Feels better if she is standing. She cannot lie on her abdomen. Reports the last time this happened her PCP gave her antibiotics and that helped. That was in the fall.  First available appointment is Wednesday with Vicie Mutters. Please advise.

## 2022-01-26 NOTE — Telephone Encounter (Signed)
Inbound call from patient requesting a call from a nurse please.  Has been experiencing abdominal pain since 4:00am today.

## 2022-01-28 ENCOUNTER — Ambulatory Visit (INDEPENDENT_AMBULATORY_CARE_PROVIDER_SITE_OTHER): Payer: Managed Care, Other (non HMO) | Admitting: Physician Assistant

## 2022-01-28 ENCOUNTER — Other Ambulatory Visit (INDEPENDENT_AMBULATORY_CARE_PROVIDER_SITE_OTHER): Payer: Managed Care, Other (non HMO)

## 2022-01-28 ENCOUNTER — Other Ambulatory Visit: Payer: Self-pay

## 2022-01-28 ENCOUNTER — Ambulatory Visit (INDEPENDENT_AMBULATORY_CARE_PROVIDER_SITE_OTHER)
Admission: RE | Admit: 2022-01-28 | Discharge: 2022-01-28 | Disposition: A | Discharge status: Home / Self Care | Payer: Managed Care, Other (non HMO) | Source: Ambulatory Visit | Attending: Physician Assistant | Admitting: Physician Assistant

## 2022-01-28 ENCOUNTER — Other Ambulatory Visit: Payer: Self-pay | Admitting: Physician Assistant

## 2022-01-28 ENCOUNTER — Other Ambulatory Visit: Payer: Managed Care, Other (non HMO)

## 2022-01-28 ENCOUNTER — Encounter: Payer: Self-pay | Admitting: Physician Assistant

## 2022-01-28 VITALS — BP 118/80 | HR 84 | Ht 65.75 in | Wt 253.0 lb

## 2022-01-28 DIAGNOSIS — K5900 Constipation, unspecified: Secondary | ICD-10-CM | POA: Diagnosis not present

## 2022-01-28 DIAGNOSIS — Z86010 Personal history of colonic polyps: Secondary | ICD-10-CM

## 2022-01-28 DIAGNOSIS — K760 Fatty (change of) liver, not elsewhere classified: Secondary | ICD-10-CM

## 2022-01-28 DIAGNOSIS — R1032 Left lower quadrant pain: Secondary | ICD-10-CM | POA: Diagnosis not present

## 2022-01-28 DIAGNOSIS — R350 Frequency of micturition: Secondary | ICD-10-CM

## 2022-01-28 LAB — COMPREHENSIVE METABOLIC PANEL
ALT: 18 U/L (ref 0–35)
AST: 12 U/L (ref 0–37)
Albumin: 4.5 g/dL (ref 3.5–5.2)
Alkaline Phosphatase: 68 U/L (ref 39–117)
BUN: 26 mg/dL — ABNORMAL HIGH (ref 6–23)
CO2: 28 mEq/L (ref 19–32)
Calcium: 10.7 mg/dL — ABNORMAL HIGH (ref 8.4–10.5)
Chloride: 101 mEq/L (ref 96–112)
Creatinine, Ser: 1.23 mg/dL — ABNORMAL HIGH (ref 0.40–1.20)
GFR: 48.48 mL/min — ABNORMAL LOW (ref >60.00)
Glucose, Bld: 113 mg/dL — ABNORMAL HIGH (ref 70–99)
Potassium: 4.2 mEq/L (ref 3.5–5.1)
Sodium: 137 mEq/L (ref 135–145)
Total Bilirubin: 0.6 mg/dL (ref 0.2–1.2)
Total Protein: 8.6 g/dL — ABNORMAL HIGH (ref 6.0–8.3)

## 2022-01-28 LAB — CBC WITH DIFFERENTIAL/PLATELET
Basophils Absolute: 0.1 10*3/uL (ref 0.0–0.1)
Basophils Relative: 0.8 % (ref 0.0–3.0)
Eosinophils Absolute: 0.2 10*3/uL (ref 0.0–0.7)
Eosinophils Relative: 2 % (ref 0.0–5.0)
HCT: 45.2 % (ref 36.0–46.0)
Hemoglobin: 15.2 g/dL — ABNORMAL HIGH (ref 12.0–15.0)
Lymphocytes Relative: 31.3 % (ref 12.0–46.0)
Lymphs Abs: 2.4 10*3/uL (ref 0.7–4.0)
MCHC: 33.5 g/dL (ref 30.0–36.0)
MCV: 89.3 fl (ref 78.0–100.0)
Monocytes Absolute: 0.5 10*3/uL (ref 0.1–1.0)
Monocytes Relative: 6.1 % (ref 3.0–12.0)
Neutro Abs: 4.5 10*3/uL (ref 1.4–7.7)
Neutrophils Relative %: 59.8 % (ref 43.0–77.0)
Platelets: 287 10*3/uL (ref 150.0–400.0)
RBC: 5.06 Mil/uL (ref 3.87–5.11)
RDW: 13.3 % (ref 11.5–15.5)
WBC: 7.6 10*3/uL (ref 4.0–10.5)

## 2022-01-28 MED ORDER — CIPROFLOXACIN HCL 500 MG PO TABS: 500.0000 mg | ORAL_TABLET | Freq: Two times a day (BID) | ORAL | 0 refills | Status: AC

## 2022-01-28 MED ORDER — METRONIDAZOLE 500 MG PO TABS: 500.0000 mg | ORAL_TABLET | Freq: Three times a day (TID) | ORAL | 0 refills | Status: AC

## 2022-01-28 MED ORDER — IOHEXOL 300 MG/ML  SOLN: 100.0000 mL | Freq: Once | INTRAMUSCULAR | Status: DC | PRN

## 2022-01-28 NOTE — Patient Instructions (Addendum)
If you are age 59 or older, your body mass index should be between 23-30. Your Body mass index is 41.15 kg/m². If this is out of the aforementioned range listed, please consider follow up with your Primary Care Provider. ° °If you are age 64 or younger, your body mass index should be between 19-25. Your Body mass index is 41.15 kg/m². If this is out of the aformentioned range listed, please consider follow up with your Primary Care Provider.  ° °________________________________________________________ ° °The [facility] providers would like to encourage you to use MYCHART to communicate with providers for non-urgent requests or questions.  Due to long hold times on the telephone, sending your provider a message by MYCHART may be a faster and more efficient way to get a response.  Please allow 48 business hours for a response.  Please remember that this is for non-urgent requests.  ° °Your provider has requested that you go to the basement level for lab work before leaving today. Press "B" on the elevator. The lab is located at the first door on the left as you exit the elevator. ° °Due to recent changes in healthcare laws, you may see the results of your imaging and laboratory studies on MyChart before your provider has had a chance to review them.  We understand that in some cases there may be results that are confusing or concerning to you. Not all laboratory results come back in the same time frame and the provider may be waiting for multiple results in order to interpret others.  Please give us 48 hours in order for your provider to thoroughly review all the results before contacting the office for clarification of your results.   ° °You have been scheduled for a CT scan of the abdomen and pelvis at [facility] ([address]---this is in the same building as [facility]).  ° °You are scheduled on 01-28-2022 at 4:30pm . You should arrive 15 minutes prior to your appointment time for  registration. Please follow the written instructions below on the day of your exam: ° °WARNING: IF YOU ARE ALLERGIC TO IODINE/X-RAY DYE, PLEASE NOTIFY RADIOLOGY IMMEDIATELY AT [phone]! YOU WILL BE GIVEN A 13 HOUR PREMEDICATION PREP. ° °1) Do not eat or drink anything after 12:30pm  (4 hours prior to your test) °2) You have been given 2 bottles of oral contrast to drink. The solution may taste better if refrigerated, but do NOT add ice or any other liquid to this solution. Shake well before drinking. °  ° Drink 1 bottle of contrast @ 230pm (2 hours prior to your exam) ° Drink 1 bottle of contrast @ 330pm (1 hour prior to your exam) ° °You may take any medications as prescribed with a small amount of water, if necessary. If you take any of the following medications: METFORMIN, GLUCOPHAGE, GLUCOVANCE, AVANDAMET, RIOMET, FORTAMET, ACTOPLUS MET, JANUMET, GLUMETZA or METAGLIP, you MAY be asked to HOLD this medication 48 hours AFTER the exam. ° °The purpose of you drinking the oral contrast is to aid in the visualization of your intestinal tract. The contrast solution may cause some diarrhea. Depending on your individual set of symptoms, you may also receive an intravenous injection of x-ray contrast/dye. Plan on being at [facility] for 30 minutes or longer, depending on the type of exam you are having performed. ° °This test typically takes 30-45 minutes to complete. ° °If you have any questions regarding your exam or if you   need to reschedule, you may call the CT department at [phone] between the hours of 8:00 am and 5:00 pm, Monday-Friday.  ________________________________________________________________________  _______________________________________________________    Will give Cipro and Flagyl  Set up CT AB and pelvis with contrast ASAP Add on fiber supplement like BENEFIBER 1-2 x a day Can take the dicyclomine at least 1-2 x a day for pain if needed.   Can do heating pad and can take  tylenol max of 3045m a day.  Can add on lidocaine patches or voltern gel Go to the ER if unable to pass gas, severe AB pain, unable to hold down food, any shortness of breath of chest pain.   Diverticulitis Diverticulitis is inflammation or infection of small pouches in your colon that form when you have a condition called diverticulosis. The pouches in your colon are called diverticula. Your colon, or large intestine, is where water is absorbed and stool is formed. Complications of diverticulitis can include: Bleeding. Severe infection. Severe pain. Perforation of your colon. Obstruction of your colon.  What are the causes? Diverticulitis is caused by bacteria. Diverticulitis happens when stool becomes trapped in diverticula. This allows bacteria to grow in the diverticula, which can lead to inflammation and infection. What increases the risk? People with diverticulosis are at risk for diverticulitis. Eating a diet that does not include enough fiber from fruits and vegetables may make diverticulitis more likely to develop. What are the signs or symptoms? Symptoms of diverticulitis may include: Abdominal pain and tenderness. The pain is normally located on the left side of the abdomen, but may occur in other areas. Fever and chills. Bloating. Cramping. Nausea. Vomiting. Constipation. Diarrhea. Blood in your stool.  How is this diagnosed? Your health care provider will ask you about your medical history and do a physical exam. You may need to have tests done because many medical conditions can cause the same symptoms as diverticulitis. Tests may include: Blood tests. Urine tests. Imaging tests of the abdomen, including X-rays and CT scans.  When your condition is under control, your health care provider may recommend that you have a colonoscopy. A colonoscopy can show how severe your diverticula are and whether something else is causing your symptoms. How is this treated? Most  cases of diverticulitis are mild and can be treated at home. Treatment may include: Taking over-the-counter pain medicines. Following a clear liquid diet. Taking antibiotic medicines by mouth for 7-10 days.  More severe cases may be treated at a hospital. Treatment may include: Not eating or drinking. Taking prescription pain medicine. Receiving antibiotic medicines through an IV tube. Receiving fluids and nutrition through an IV tube. Surgery.  Follow these instructions at home: Follow your health care providers instructions carefully. Follow a full liquid diet or other diet as directed by your health care provider. After your symptoms improve, your health care provider may tell you to change your diet. He or she may recommend you eat a high-fiber diet. Fruits and vegetables are good sources of fiber. Fiber makes it easier to pass stool. Take fiber supplements or probiotics as directed by your health care provider. Only take medicines as directed by your health care provider. Keep all your follow-up appointments. Contact a health care provider if: Your pain does not improve. You have a hard time eating food. Your bowel movements do not return to normal. Get help right away if: Your pain becomes worse. Your symptoms do not get better. Your symptoms suddenly get worse. You have a fever.  You have repeated vomiting. °You have bloody or black, tarry stools. °This information is not intended to replace advice given to you by your health care provider. Make sure you discuss any questions you have with your health care provider. °Document Released: 08/26/2005 Document Revised: 04/23/2016 Document Reviewed: 10/11/2013 °Elsevier Interactive Patient Education © 2017 [employer] ° °Fatty liver or Nonalcoholic fatty liver disease (NASH)  °Now the leading cause of liver failure in the [place].  °It is normally from such risk factors as obesity, diabetes, insulin resistance, high cholesterol,  or metabolic syndrome.  °The only definitive therapy is weight loss and exercise.  ° ° °Suggest walking 20-30 mins daily.  °Decreasing carbohydrates, increasing veggies.  ° ° °Fatty Liver °Fatty liver is the accumulation of fat in liver cells. It is also called hepatosteatosis or steatohepatitis. It is normal for your liver to contain some fat. If fat is more than 5 to 10% of your liver's weight, you have fatty liver.  °There are often no symptoms (problems) for years while damage is still occurring. People often learn about their fatty liver when they have medical tests for other reasons. Fat can damage your liver for years or even decades without causing problems. When it becomes severe, it can cause fatigue, weight loss, weakness, and confusion. °This makes you more likely to develop more serious liver problems. The liver is the largest organ in the body. It does a lot of work and often gives no warning signs when it is sick until late in a disease. °The liver has many important jobs including: °Breaking down foods. °Storing vitamins, iron, and other minerals. °Making proteins. °Making bile for food digestion. °Breaking down many products including medications, alcohol and some poisons. ° °PROGNOSIS  °Fatty liver may cause no damage or it can lead to an inflammation of the liver. This is, called steatohepatitis.  Over time the liver may become scarred and hardened. This condition is called cirrhosis. Cirrhosis is serious and may lead to liver failure or cancer. NASH is one of the leading causes of cirrhosis. About 10-20% of Americans have fatty liver and a smaller 2-5% has NASH. ° °TREATMENT  °Weight loss, fat restriction, and exercise in overweight patients produces inconsistent results but is worth trying. °Good control of diabetes may reduce fatty liver. °Eat a balanced, healthy diet. °Increase your physical activity. °There are no medical or surgical treatments for a fatty liver or NASH, but improving your  diet and increasing your exercise may help prevent or reverse some of the damage. ° ° ° °

## 2022-01-28 NOTE — Progress Notes (Signed)
01/28/2022 Lisa [name] [id number] 1961/11/22   ASSESSMENT AND PLAN:   LLQ abdominal pain Had diverticulitis 08/2021 confirmed with CT, per patient augmentin x 5 days prescribed and felt better until 02/17.  Has AB pain, some mild rebound tenderness on exam, no fever, chills, vomiting.  Very concerning for diverticulitis, will repeat CT to rule out complications will do with contrast, last GFR 45 2 weeks ago ER precautions discussed Sent in cipro/flagyl, may need to go to the hospital if complications or not responding outpatient, close follow up.  Add on fiber, heating pad. Did not send in bentyl but can send in if CT is normal -     ciprofloxacin (CIPRO) 500 MG tablet; Take 1 tablet (500 mg total) by mouth 2 (two) times daily for 14 days. -     metroNIDAZOLE (FLAGYL) 500 MG tablet; Take 1 tablet (500 mg total) by mouth 3 (three) times daily for 14 days. -     CBC with Differential/Platelet; Future -     Comprehensive metabolic panel; Future  Fatty liver Continue weight loss  History of adenomatous polyp of colon 05/2020 colon 3 sessile polyps Due 05/2023  Constipation, unspecified constipation type - Increase fiber/ water intake, decrease caffeine, increase activity level, can add  miralax  until BM soft.  Please go to the hospital if you have severe abdominal pain, vomiting, fever, CP, SOB.    History of Present Illness:  61 y.o. female  with a past medical history of diverticulosis, hypertension, hyperlipidemia, peripheral arterial disease, respiratory bronchiolitis associate with interstitial lung disease, CKD, obesity, sleep apnea, diabetes and others listed below, known to Dr. Fuller Plan returns to clinic today for evaluation of abdominal pain. Last seen in the office for fluttering and chest, some dysphagia 04/01/2021. Changed PPI to 30 minutes prior to breakfast 04/08/2021 barium swallow that showed esophageal dysmotility, no evidence of esophagitis or GERD, suspicious  minimal peptic stricture at area of persistent underdistention involving the gastroesophageal junction.  Mild delayed passage of 13 mm barium tablet in distal esophagus. 09/09/2021 CT abdomen pelvis with contrast sigmoid diverticulitis uncomplicated, normal appendix, hepatic steatosis-treated with antibiotics x 5 days only, she was doing well.  05/2020 colon 3 sessile polyps  Patient's been having abdominal pain since 01/16/2022.   She was having LLQ pain since that time, worse constant pain, worse with sitting/pressure on it.  Stopped popcorn and seeds. Pain is worse with eating.  She has been on soup diet since Monday.  She has had nausea, no vomiting. No fever, chills. No weight loss.  Thought due to constipation increase water and fiber without help.  CBC  01/15/2022  HGB 14.3 MCV 89.3 without evidence of anemia WBC 7.1 Platelets 275.0 09/12/2020  B12 585 Kidney function 01/15/2022  BUN 25 Cr 1.22  GFR 43  Potassium 4.2   LFTs 01/15/2022  AST 14 ALT 22 Alkphos 63 TBili 0.4 No results found for requested labs within last 26280 hours. LIPASE No results found for requested labs within last 26280 hours. 02/14/2021 TSH 1.190  Previous GI history:  DG Lumbar Spine Complete  Result Date: 01/16/2022 CLINICAL DATA:  Low back pain radiating to the right hip EXAM: LUMBAR SPINE - COMPLETE 4+ VIEW COMPARISON:  10/07/2018 FINDINGS: Normal alignment. Preserved vertebral body heights. No acute compression fracture, wedge-shaped deformity or focal kyphosis. No pars defects. Slight disc space narrowing at L5-S1 with associated posterior facet arthropathy. Overall stable appearance. Minor bilateral SI joint arthropathy with increased sclerosis noted. Stable postoperative  findings following abdominal wall hernia repair and remote cholecystectomy. Nonobstructive bowel gas pattern. Aorta atherosclerotic. IMPRESSION: Degenerative changes as above. No acute finding or significant interval change by plain  radiography Aortic Atherosclerosis (ICD10-I70.0). Electronically Signed   By: Jerilynn Mages.  Shick M.D.   On: 01/16/2022 14:27   DG HIP UNILAT W OR W/O PELVIS 2-3 VIEWS RIGHT  Result Date: 01/16/2022 CLINICAL DATA:  R hip pain EXAM: DG HIP (WITH OR WITHOUT PELVIS) 2-3V RIGHT COMPARISON:  09/09/2021 CT FINDINGS: Intact right hip with relatively preserved joint space. No significant arthropathy or acute osseous finding. Negative for fracture. Bony pelvis and hips appear symmetric and intact. Mild bilateral SI joint arthropathy without ankylosis. Surgical changes from lower abdominal wall hernia repair. Nonobstructive bowel gas pattern. IMPRESSION: No acute osseous finding. Electronically Signed   By: Jerilynn Mages.  Shick M.D.   On: 01/16/2022 14:25    Current Medications:   Current Outpatient Medications (Endocrine & Metabolic):    dapagliflozin propanediol (FARXIGA) 5 MG TABS tablet, Take 1 tablet (5 mg total) by mouth daily before breakfast.  Current Outpatient Medications (Cardiovascular):    carvedilol (COREG) 25 MG tablet, Take 1 tablet (25 mg total) by mouth 2 (two) times daily.   NEXLIZET 180-10 MG TABS, TAKE 1 TABLET BY MOUTH EVERY DAY   PRALUENT 75 MG/ML SOAJ, INJECT 1 PEN INTO THE SKIN EVERY 14 (FOURTEEN) DAYS.   spironolactone (ALDACTONE) 25 MG tablet, TAKE 1 TABLET BY MOUTH EVERY DAY   telmisartan (MICARDIS) 40 MG tablet, Take 1 tablet (40 mg total) by mouth daily.   Current Outpatient Medications (Analgesics):    ASPIRIN 81 PO, Take 81 mg by mouth daily.   Current Outpatient Medications (Other):    buPROPion (WELLBUTRIN SR) 150 MG 12 hr tablet, Take 150 mg by mouth daily.   ciprofloxacin (CIPRO) 500 MG tablet, Take 1 tablet (500 mg total) by mouth 2 (two) times daily for 14 days.   dicyclomine (BENTYL) 10 MG capsule, Take 1 capsule (10 mg total) by mouth 3 (three) times daily before meals. As needed   metroNIDAZOLE (FLAGYL) 500 MG tablet, Take 1 tablet (500 mg total) by mouth 3 (three) times daily  for 14 days.   ONE TOUCH LANCETS MISC, Use to check blood sugars twice a day   pantoprazole (PROTONIX) 40 MG tablet, TAKE 1 TABLET BY MOUTH 30 MINS BEFORE BREAKFAST DAILY.  Surgical History:  She  has a past surgical history that includes Cesarean section (2003, 2009); laparoscopy; Umbilical hernia repair (2004); US ECHOCARDIOGRAPHY (05/08/2008); Cardiovascular stress test (03/25/2007); Endometrial ablation (09/2010); Cardiac catheterization (12/28/2007); Cholecystectomy (1993); Hernia repair (2004); Ventral hernia repair (06/15/2012); Colonoscopy (2014); Tubal ligation; ABDOMINAL AORTOGRAM W/LOWER EXTREMITY (N/A, 04/30/2017); PERIPHERAL VASCULAR BALLOON ANGIOPLASTY (Left, 04/30/2017); Video bronchoscopy (Bilateral, 11/15/2017); and Reconstruction of eyelid (Bilateral). Family History:  Her family history includes Breast cancer in an other family member; Diabetes in her maternal grandmother; Heart attack in her paternal uncle; Heart disease in her father; Hypertension in her mother; Lung cancer in her mother. Social History:   reports that she has been smoking cigarettes. She has a 57.00 pack-year smoking history. She has never used smokeless tobacco. She reports that she does not drink alcohol and does not use drugs.  Current Medications, Allergies, Past Medical History, Past Surgical History, Family History and Social History were reviewed in [employer] record.  Physical Exam: BP 118/80 (BP Location: Left Arm, Patient Position: Sitting, Cuff Size: Large)    Pulse 84    Ht 5' 5.75" (  1.67 m)    Wt 253 lb (114.8 kg)    BMI 41.15 kg/m  General:   Pleasant, well developed female in no acute distress Eyes: sclerae anicteric,conjunctive pink  Heart:  regular rate and rhythm Pulm: Clear anteriorly; no wheezing Abdomen:  Soft, Obese AB, skin exam , did not examine skin, Normal bowel sounds. moderate tenderness in the lower abdomen and worse LLQ . With guarding and With rebound mild,  without hepatomegaly. Extremities:  Without edema. Peripheral pulses intact.  Neurologic:  Alert and  oriented x4;  grossly normal neurologically. Skin:   Dry and intact without significant lesions or rashes. Psychiatric: Demonstrates good judgement and reason without abnormal affect or behaviors.  Vladimir Crofts, PA-C 01/28/22

## 2022-01-29 LAB — URINE CULTURE
MICRO NUMBER:: 13073922
Result:: NO GROWTH
SPECIMEN QUALITY:: ADEQUATE

## 2022-02-16 ENCOUNTER — Telehealth (INDEPENDENT_AMBULATORY_CARE_PROVIDER_SITE_OTHER): Payer: Managed Care, Other (non HMO) | Admitting: Family Medicine

## 2022-02-16 ENCOUNTER — Encounter: Payer: Self-pay | Admitting: Family Medicine

## 2022-02-16 VITALS — HR 73 | Temp 97.9°F

## 2022-02-16 DIAGNOSIS — J069 Acute upper respiratory infection, unspecified: Secondary | ICD-10-CM | POA: Diagnosis not present

## 2022-02-16 MED ORDER — PREDNISONE 20 MG PO TABS: 40.0000 mg | ORAL_TABLET | Freq: Every day | ORAL | 0 refills | Status: AC

## 2022-02-16 NOTE — Progress Notes (Signed)
? ? ?MyChart Video Visit ? ? ? ?Virtual Visit via Video Note  ? ?This visit type was conducted due to national recommendations for restrictions regarding the COVID-19 Pandemic (e.g. social distancing) in an effort to limit this patient's exposure and mitigate transmission in our community. This patient is at least at moderate risk for complications without adequate follow up. This format is felt to be most appropriate for this patient at this time. Physical exam was limited by quality of the video and audio technology used for the visit. CMA was able to get the patient set up on a video visit. ? ?Patient location: Home. Patient and provider in visit ?Provider location: Office ? ?I discussed the limitations of evaluation and management by telemedicine and the availability of in person appointments. The patient expressed understanding and agreed to proceed. ? ?Visit Date: 02/16/2022 ? ?Today's healthcare provider: Wellington Hampshire, MD  ? ? ? ?Subjective:  ? ? Patient ID: Lisa [name], female    DOB: Aug 27, 1961, 61 y.o.   MRN: [id number] ? ?Chief Complaint  ?Patient presents with  ? Sore Throat  ? Headache  ?  Headache in eye on right side ?Took nasal mist and allergy pill  ? Ear Pain  ?  Right ear  ? Nasal Congestion  ?  Sx started Saturday afternoon, right side constantly drips  ? Fatigue  ? Raspy voice  ? ? ?HPI ?Chief complaint: sore throat, ha, cough R ear pain, fatigue ?Symptom onset: Saturday afternoon ?Pertinent positives: nausea, exhausted, HA, sneezing, congested, feels aweful.  Using allergy meds-not helping ?Pertinent negatives: covid test neg 61mn ago, temp 97.9 ?Treatments tried: otc allergy meds. ?Vaccine status: full ?Sick exposure: none  ? ?Past Medical History:  ?Diagnosis Date  ? Allergic rhinitis   ? Arthritis   ? Asthma   ? Breast nodule   ? right breast, to see dr gHelane [id number] for   ? Chest pain, non-cardiac   ? Chronic headaches   ? Cigarette smoker   ? Complication of anesthesia 05-17-2012  ?  trouble breathing after colonscopy, needed nebulizer  ? COPD [place] obstructive [place] disease) [place]   ? Diastolic congestive heart failure ([place] 04/17/2019  ? Diverticulosis   ? GERD (gastroesophageal reflux disease)   ? Hepatic hemangioma   ? Hypertension   ? IBS (irritable bowel syndrome)   ? Nuclear stress test   ? Myoview 05/2019: EF 63, no ischemia. Low Risk  ? Obesity   ? Prosthetic eye globe   ? Pyloric erosion   ? Sleep apnea   ? CPAP setting varies from 4-10  ? Type 2 diabetes mellitus ([facility] 03/11/2017  ? ? ?Past Surgical History:  ?Procedure Laterality Date  ? ABDOMINAL AORTOGRAM W/LOWER EXTREMITY N/A 04/30/2017  ? Procedure: Abdominal Aortogram w/Lower Extremity;  Surgeon: FElam Dutch MD;  [facility];  Service: Cardiovascular;  Laterality: N/A;  ? CARDIAC CATHETERIZATION  12/28/2007  ? EF 75%. IT REVEALS NORMAL/SUPRANORMAL LEFT VENTRICULAR SYSTOLIC FUNCTION  ? CARDIOVASCULAR STRESS TEST  03/25/2007  ? EF 78%  ? CESAREAN SECTION  2003, 2009  ? x 2  ? CHOLECYSTECTOMY  1993  ? COLONOSCOPY  2014  ? ENDOMETRIAL ABLATION  09/2010  ? and D&C  ? HERNIA REPAIR  [id number] ? umbilical  ? LAPAROSCOPY    ? PERIPHERAL VASCULAR BALLOON ANGIOPLASTY Left 04/30/2017  ? Procedure: Peripheral Vascular Balloon Angioplasty;  Surgeon: FElam Dutch MD;  [facility];  Service: Cardiovascular;  Laterality: Left;  SFA  ? RECONSTRUCTION OF EYELID Bilateral   ? TUBAL LIGATION    ? UMBILICAL HERNIA REPAIR  2004  ? US ECHOCARDIOGRAPHY  05/08/2008  ? EF 55-60%  ? VENTRAL HERNIA REPAIR  06/15/2012  ? Procedure: LAPAROSCOPIC VENTRAL HERNIA;  Surgeon: Edward Jolly, MD;  Location: [facility];  Service: General;  Laterality: N/A;  LAPAROSCOPIC REPAIR VENTRAL HERNIA  ? VIDEO BRONCHOSCOPY Bilateral 11/15/2017  ? Procedure: VIDEO BRONCHOSCOPY WITH FLUORO;  Surgeon: Collene Gobble, MD;  Location: [facility];  Service: Cardiopulmonary;  Laterality: Bilateral;  ? ? ?Outpatient Medications Prior to Visit   ?Medication Sig Dispense Refill  ? ASPIRIN 81 PO Take 81 mg by mouth daily.    ? buPROPion (WELLBUTRIN SR) 150 MG 12 hr tablet Take 150 mg by mouth daily.    ? carvedilol (COREG) 25 MG tablet Take 1 tablet (25 mg total) by mouth 2 (two) times daily. 180 tablet 3  ? ciprofloxacin (CIPRO) 500 MG tablet Take 500 mg by mouth 2 (two) times daily.    ? dapagliflozin propanediol (FARXIGA) 5 MG TABS tablet Take 1 tablet (5 mg total) by mouth daily before breakfast. 30 tablet 6  ? dicyclomine (BENTYL) 10 MG capsule Take 1 capsule (10 mg total) by mouth 3 (three) times daily before meals. As needed 270 capsule 3  ? metroNIDAZOLE (FLAGYL) 500 MG tablet Take 500 mg by mouth 3 (three) times daily.    ? NEXLIZET 180-10 MG TABS TAKE 1 TABLET BY MOUTH EVERY DAY 90 tablet 3  ? ONE TOUCH LANCETS MISC Use to check blood sugars twice a day 200 each 0  ? pantoprazole (PROTONIX) 40 MG tablet TAKE 1 TABLET BY MOUTH 30 MINS BEFORE BREAKFAST DAILY. 90 tablet 1  ? PRALUENT 75 MG/ML SOAJ INJECT 1 PEN INTO THE SKIN EVERY 14 (FOURTEEN) DAYS. 6 mL 3  ? spironolactone (ALDACTONE) 25 MG tablet TAKE 1 TABLET BY MOUTH EVERY DAY 90 tablet 1  ? telmisartan (MICARDIS) 40 MG tablet Take 1 tablet (40 mg total) by mouth daily. 90 tablet 3  ? ?No facility-administered medications prior to visit.  ? ? ?Allergies  ?Allergen Reactions  ? Almond Oil Anaphylaxis and Nausea And Vomiting  ?  Almond-Nuts Only  ? Apple Juice Anaphylaxis  ? Ceftin [Cefuroxime] Other (See Comments)  ?  Severe stomach pain - Diverticulitis flares up  ? Cherry Extract Anaphylaxis  ?  Cherry fruit only ?  ? Other Anaphylaxis and Itching  ?  Most fruits- Are NOT tolerated (PLEASE ASK PATIENT BEFORE GIVING, AS CERTAIN FRUITS ARE TOLERATED IN LIMITED QUANTITIES!!)  ? Guaifenesin & Derivatives Other (See Comments)  ?  Restlessness, jittery  ? Hctz [Hydrochlorothiazide] Other (See Comments)  ?  Causes a headache  ? Naproxen Other (See Comments)  ?  Insomnia  ? Lisinopril Cough  ?   ?   ? ? ? ?   ?Objective:  ?  ? ?Physical Exam  ?Vitals and nursing note reviewed.  ?Constitutional:   ?   General:  is not in acute distress. ?   Appearance: mod ill but nontoxic.  congested ?HENT:  ?   Head: Normocephalic.  ?Pulmonary:  ?   Effort: No respiratory distress.  ?Skin: ?   General: Skin is dry.  ?   Coloration: Skin is not pale.  ?Neurological:  ?   Mental Status: Pt is alert and oriented to person, place, and time.  ?Psychiatric:     ?   Mood  and Affect: Mood normal.  ? ?Pulse 73   Temp 97.9 ?F (36.6 ?C) (Oral)   SpO2 97%  ? ?Wt Readings from Last 3 Encounters:  ?01/28/22 253 lb (114.8 kg)  ?01/15/22 256 lb 3.2 oz (116.2 kg)  ?01/13/22 256 lb (116.1 kg)  ? ? ?   ?Assessment & Plan:  ? ?Problem List Items Addressed This Visit   ?None ?Visit Diagnoses   ? ? Acute upper respiratory infection    -  Primary  ? Relevant Medications  ? metroNIDAZOLE (FLAGYL) 500 MG tablet  ? ?  ? URI-covid vs other.  Pred for congestion.  Will come tomorrow morn for another covid test.  Symptomatic tx.  Rest.  ? ?Meds ordered this encounter  ?Medications  ? predniSONE (DELTASONE) 20 MG tablet  ?  Sig: Take 2 tablets (40 mg total) by mouth daily with breakfast for 5 days.  ?  Dispense:  10 tablet  ?  Refill:  0  ? ? ?I discussed the assessment and treatment plan with the patient. The patient was provided an opportunity to ask questions and all were answered. The patient agreed with the plan and demonstrated an understanding of the instructions. ?  ?The patient was advised to call back or seek an in-person evaluation if the symptoms worsen or if the condition fails to improve as anticipated. ? ?I provided 20 minutes of face-to-face time during this encounter. ? ? ?Wellington Hampshire, MD ?[place] ?[phone] (phone) ?[phone] (fax) ? ?[facility]  ?

## 2022-02-16 NOTE — Patient Instructions (Signed)
Meds have been sent the the pharmacy ?You can take tylenol for pain/fevers ?If worsening symptoms, let us know or go to the Emergency room  ? ?Get rest.  Off work today and tomorrow.  Come tomorrow for covid test ?

## 2022-02-17 ENCOUNTER — Encounter: Payer: Self-pay | Admitting: Family Medicine

## 2022-02-17 LAB — POC COVID19 BINAXNOW: SARS Coronavirus 2 Ag: NEGATIVE

## 2022-02-17 NOTE — Addendum Note (Signed)
Addended by: Zacarias Pontes on: 02/17/2022 09:58 AM ? ? Modules accepted: Orders ? ?

## 2022-02-25 NOTE — Progress Notes (Signed)
? ?Phone [phone] ?  ?Subjective:  ?Patient presents today for their annual physical. Chief complaint-noted.  ? ?See problem oriented charting- ?ROS- full  review of systems was completed and negative ?except for: occasional mild drooling, mouth sores at times, occasional cough and rare wheeze, some hemorrhoid related bleeding, nausea occasional, diarrhea at times, cold intolerance, increasing eating, urinary frequency, seasona allergies, food allergies, occasional dizziness, headaches, agitation, sad mood, anxiety ? ?The following were reviewed and entered/updated in epic: ?Past Medical History:  ?Diagnosis Date  ? Allergic rhinitis   ? Arthritis   ? Asthma   ? Breast nodule   ? right breast, to see dr Helane Rima 06-20-2012 for   ? Chest pain, non-cardiac   ? Chronic headaches   ? Cigarette smoker   ? Complication of anesthesia 05-17-2012  ? trouble breathing after colonscopy, needed nebulizer  ? COPD (chronic obstructive pulmonary disease) ([place])   ? Diastolic congestive heart failure ([place]) 04/17/2019  ? Diverticulosis   ? GERD (gastroesophageal reflux disease)   ? Hepatic hemangioma   ? Hypertension   ? IBS (irritable bowel syndrome)   ? Nuclear stress test   ? Myoview 05/2019: EF 63, no ischemia. Low Risk  ? Obesity   ? Prosthetic eye globe   ? Pyloric erosion   ? Sleep apnea   ? CPAP setting varies from 4-10  ? Type 2 diabetes mellitus ([place]) 03/11/2017  ? ?Patient Active Problem List  ? Diagnosis Date Noted  ? Type 2 diabetes mellitus with hyperglycemia, without long-term current use of insulin ([place]) 10/03/2021  ?  Priority: High  ? Coronary artery calcification 04/20/2019  ?  Priority: High  ? Diastolic congestive heart failure ([place]) 04/17/2019  ?  Priority: High  ? PAD (peripheral artery disease) ([place]) 01/19/2017  ?  Priority: High  ? 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  Priority: High  ? Morbid obesity ([place]) 02/14/2016  ?  Priority: High  ? Tobacco use disorder  08/12/2015  ?  Priority: High  ? COPD (chronic obstructive pulmonary disease) ([place]) 01/18/2015  ?  Priority: High  ? Anxiety 03/24/2022  ?  Priority: Medium   ? Aortic atherosclerosis ([place]) 09/10/2021  ?  Priority: Medium   ? 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  Priority: Medium   ? CKD (chronic kidney disease), stage III ([place]) 09/18/2020  ?  Priority: Medium   ? Fatty liver 11/06/2019  ?  Priority: Medium   ? Hyperlipidemia 04/12/2018  ?  Priority: Medium   ? Hyperlipidemia associated with type 2 diabetes mellitus ([place]) 09/06/2017  ?  Priority: Medium   ? Breast mass 08/03/2017  ?  Priority: Medium   ? GERD (gastroesophageal reflux disease) 02/14/2016  ?  Priority: Medium   ? DOE (dyspnea on exertion) 03/08/2014  ?  Priority: Medium   ? Migraine without aura 07/26/2013  ?  Priority: Medium   ? Sleep apnea 02/11/2012  ?  Priority: Medium   ? Chest discomfort 10/12/2011  ?  Priority: Medium   ? Essential hypertension 10/12/2011  ?  Priority: Medium   ? Chronic rhinitis 06/26/2019  ?  Priority: Low  ? Trigger finger, right middle finger 05/04/2019  ?  Priority: Low  ? Bilateral leg pain 04/14/2018  ?  Priority: Low  ? S/P bronchoscopy with biopsy   ?  Priority: Low  ? Chronic hip pain 07/29/2017  ?  Priority: Low  ? Left knee pain 05/15/2016  ?  Priority: Low  ? Flank pain 04/21/2016  ?  Priority: Low  ? Abdominal pain 02/28/2016  ?  Priority: Low  ? Pulmonary nodule, right 04/11/2015  ?  Priority: Low  ? Mediastinal lymphadenopathy 01/18/2015  ?  Priority: Low  ? ?Past Surgical History:  ?Procedure Laterality Date  ? ABDOMINAL AORTOGRAM W/LOWER EXTREMITY N/A 04/30/2017  ? Procedure: Abdominal Aortogram w/Lower Extremity;  Surgeon: Elam Dutch, MD;  Location: [facility];  Service: Cardiovascular;  Laterality: N/A;  ? CARDIAC CATHETERIZATION  12/28/2007  ? EF 75%. IT REVEALS NORMAL/SUPRANORMAL LEFT VENTRICULAR SYSTOLIC FUNCTION  ? CARDIOVASCULAR STRESS TEST  03/25/2007  ? EF 78%  ? CESAREAN SECTION   2003, 2009  ? x 2  ? CHOLECYSTECTOMY  1993  ? COLONOSCOPY  2014  ? ENDOMETRIAL ABLATION  09/2010  ? and D&C  ? HERNIA REPAIR  5009  ? umbilical  ? LAPAROSCOPY    ? PERIPHERAL VASCULAR BALLOON ANGIOPLASTY Left 04/30/2017  ? Procedure: Peripheral Vascular Balloon Angioplasty;  Surgeon: Elam Dutch, MD;  Location: [facility];  Service: Cardiovascular;  Laterality: Left;  SFA  ? RECONSTRUCTION OF EYELID Bilateral   ? TUBAL LIGATION    ? UMBILICAL HERNIA REPAIR  2004  ? US ECHOCARDIOGRAPHY  05/08/2008  ? EF 55-60%  ? VENTRAL HERNIA REPAIR  06/15/2012  ? Procedure: LAPAROSCOPIC VENTRAL HERNIA;  Surgeon: Edward Jolly, MD;  Location: [facility];  Service: General;  Laterality: N/A;  LAPAROSCOPIC REPAIR VENTRAL HERNIA  ? VIDEO BRONCHOSCOPY Bilateral 11/15/2017  ? Procedure: VIDEO BRONCHOSCOPY WITH FLUORO;  Surgeon: Collene Gobble, MD;  Location: [facility];  Service: Cardiopulmonary;  Laterality: Bilateral;  ? ? ?Family History  ?Problem Relation Age of Onset  ? Hypertension Mother   ? Lung cancer Mother   ?     31  ? Diabetes Maternal Grandmother   ? Heart disease Father   ?     pacemaker or defibrillator  ? Heart attack Paternal Uncle   ? Breast cancer Other   ? Colon cancer Neg Hx   ? Esophageal cancer Neg Hx   ? Pancreatic cancer Neg Hx   ? ? ?Medications- reviewed and updated ?Current Outpatient Medications  ?Medication Sig Dispense Refill  ? ASPIRIN 81 PO Take 81 mg by mouth daily.    ? carvedilol (COREG) 25 MG tablet Take 1 tablet (25 mg total) by mouth 2 (two) times daily. 180 tablet 3  ? dicyclomine (BENTYL) 10 MG capsule Take 1 capsule (10 mg total) by mouth 3 (three) times daily before meals. As needed 270 capsule 3  ? NEXLIZET 180-10 MG TABS TAKE 1 TABLET BY MOUTH EVERY DAY 30 tablet 0  ? ONE TOUCH LANCETS MISC Use to check blood sugars twice a day 200 each 0  ? pantoprazole (PROTONIX) 40 MG tablet TAKE 1 TABLET BY MOUTH 30 MINS BEFORE BREAKFAST DAILY. 90 tablet 1  ? telmisartan (MICARDIS) 40 MG  tablet Take 1 tablet (40 mg total) by mouth daily. 90 tablet 3  ? buPROPion (WELLBUTRIN XL) 150 MG 24 hr tablet Take 1 tablet (150 mg total) by mouth daily. 90 tablet 3  ? dapagliflozin propanediol (FARXIGA) 5 MG TABS tablet Take 1 tablet (5 mg total) by mouth daily before breakfast. (Patient not taking: Reported on 03/24/2022) 30 tablet 6  ? ?No current facility-administered medications for this visit.  ? ? ?Allergies-reviewed and updated ?Allergies  ?Allergen Reactions  ? Almond Oil Anaphylaxis and Nausea And Vomiting  ?  Almond-Nuts Only  ? Apple Juice Anaphylaxis  ? Ceftin [Cefuroxime] Other (See Comments)  ?  Severe stomach pain - Diverticulitis flares up  ? Cherry Extract Anaphylaxis  ?  Cherry fruit only ?  ? Other Anaphylaxis and Itching  ?  Most fruits- Are NOT tolerated (PLEASE ASK PATIENT BEFORE GIVING, AS CERTAIN FRUITS ARE TOLERATED IN LIMITED QUANTITIES!!)  ? Guaifenesin & Derivatives Other (See Comments)  ?  Restlessness, jittery  ? Hctz [Hydrochlorothiazide] Other (See Comments)  ?  Causes a headache  ? Naproxen Other (See Comments)  ?  Insomnia  ? Lisinopril Cough  ?   ?  ? ? ?Social History  ? ?Social History Narrative  ? Married 1997. 2 sons 73 and 87 years old in 10/18. Wants to see children grow up.   ?   ? Customer Service/accounts payable.   ? Went to school for medical assisting- has had a hard time finding work  ?   ? Hobbies: PC games, enjoying [place], getting out of home, wants to take [place]  ? ?Objective  ?Objective:  ?BP 112/68   Pulse 77   Temp 98.4 ?F (36.9 ?C)   Ht 5' 5.5" (1.664 m)   Wt 253 lb 12.8 oz (115.1 kg)   SpO2 97%   BMI 41.59 kg/m?  ?Gen: NAD, resting comfortably ?HEENT: Mucous membranes are moist. Oropharynx normal ?Neck: no thyromegaly ?CV: RRR no murmurs rubs or gallops ?Lungs: CTAB no crackles, wheeze, rhonchi ?Abdomen: soft/nontender/nondistended/normal bowel sounds. No rebound or guarding. Obesity noted.  ?Ext: no edema ?Skin: warm, dry ?Neuro: grossly normal,  moves all extremities, PERRLA ?  ?Assessment and Plan  ? ?61 y.o. female presenting for annual physical.  ?Health Maintenance counseling: ?1. Anticipatory guidance: Patient counseled regarding regular dental exams

## 2022-02-26 ENCOUNTER — Ambulatory Visit: Payer: Self-pay

## 2022-02-26 ENCOUNTER — Ambulatory Visit (INDEPENDENT_AMBULATORY_CARE_PROVIDER_SITE_OTHER): Payer: Managed Care, Other (non HMO) | Admitting: Family Medicine

## 2022-02-26 VITALS — BP 152/90 | HR 72 | Ht 65.75 in | Wt 254.7 lb

## 2022-02-26 DIAGNOSIS — M25511 Pain in right shoulder: Secondary | ICD-10-CM | POA: Diagnosis not present

## 2022-02-26 DIAGNOSIS — G8929 Other chronic pain: Secondary | ICD-10-CM

## 2022-02-26 NOTE — Progress Notes (Signed)
? ?I, [name], LAT, ATC acting as a scribe for [name], MD. ? ?Lisa [name] is a 61 y.o. female who presents to [place] at [facility] today for f/u bilat hip pain thought to be due to GT bursitis and likely L5 radiculopathy. Pt has hx of LBP and B hip pain, having been seen by Dr. Durward Fortes for these c/o previously in 2019 and had both a R and L L4-5 ESI in 2020. Pt was last seen by Dr. [name] on 01/15/22 and was prescribed tizanidine and referred to PT, but she has not completed nor scheduled any visits. Today, pt reports only had 1 flare of her hip pain about 2 weeks ago that was short lived. Pt reports not getting a call from PT. ? ?Pt also c/o R shoulder pain. Pt previously had a steroid injection in her R shoulder in 2016 by Dr. Maxie Better. Pt locates pain to the lateral aspect of the R [place] joint.  ? ?Dx imaging: 01/15/22 R hip & L-spine XR ? 03/28/19 L-spine XR ? 10/07/18 L-spine & bilat hip XR ? 07/30/17 Bilat hip XR ? 03/25/17 L-spine MRI ? 03/14/15 R shoulder XR ? ?Pertinent review of systems: No fevers or chills ? ?Relevant historical information: PAD.  Heart failure.  Obesity. ? ? ?Exam:  ?BP (!) 152/90   Pulse 72   Ht 5' 5.75" (1.67 m)   Wt 254 lb 11.2 oz (115.5 kg)   SpO2 98%   BMI 41.42 kg/m?  ?General: Well Developed, well nourished, and in no acute distress.  ? ?MSK: Right shoulder: Normal-appearing ?Nontender. ?Range of motion abduction full external rotation full functional internal rotation to lumbar spine. ?Mildly positive Hawkins and Neer's test.  Mildly positive empty can test. ?Negative Yergason's and speeds test. ?Strength abduction 4/5 intact external and internal rotation ? ? ? ?Lab and Radiology Results ?\Procedure: Real-time Ultrasound Guided Injection of right shoulder subacromial bursa ?Device: Philips Affiniti 50G ?Images permanently stored and available for review in PACS ?Verbal informed consent obtained.  Discussed risks and benefits of procedure. Warned  about infection bleeding damage to structures skin hypopigmentation and fat atrophy among others. ?Patient expresses understanding and agreement ?Time-out conducted.   ?Noted no overlying erythema, induration, or other signs of local infection.   ?Skin prepped in a sterile fashion.   ?Local anesthesia: Topical Ethyl chloride.   ?With sterile technique and under real time ultrasound guidance: 40 mg of Kenalog and 2 mL of Marcaine injected into subacromial bursa. Fluid seen entering the bursa.   ?Completed without difficulty   ?Pain immediately resolved suggesting accurate placement of the medication.   ?Advised to call if fevers/chills, erythema, induration, drainage, or persistent bleeding.   ?Images permanently stored and available for review in the ultrasound unit.  ?Impression: Technically successful ultrasound guided injection. ? ? ? ? ? ? ? ? ?Assessment and Plan: ?61 y.o. female with right shoulder pain primarily thought to be due to subacromial bursitis and some rotator cuff tendinopathy.  She did have pretty good results to steroid injection in clinic today.  If not improving with the injection she will let me know and I can refer to physical therapy.  Recheck back as needed. ? ? ?PDMP not reviewed this encounter. ?Orders Placed This Encounter  ?Procedures  ? [name] LIMITED JOINT SPACE STRUCTURES UP RIGHT(NO LINKED CHARGES)  ?  Order Specific Question:   Reason for Exam (SYMPTOM  OR DIAGNOSIS REQUIRED)  ?  Answer:   right shoulder  pain  ?  Order Specific Question:   Preferred imaging location?  ?  Answer:   [place] [facility]  ? ?No orders of the defined types were placed in this encounter. ? ? ? ?Discussed warning signs or symptoms. Please see discharge instructions. Patient expresses understanding. ? ? ?The above documentation has been reviewed and is accurate and complete [name], M.D. ? ? ?

## 2022-02-26 NOTE — Patient Instructions (Addendum)
Thank you for coming in today.  ? ?You received a steroid injection in your right shoulder today. Seek immediate medical attention if the joint becomes red, extremely painful, or is oozing fluid.  ? ?Recheck back as needed ?

## 2022-03-21 ENCOUNTER — Other Ambulatory Visit: Payer: Self-pay | Admitting: Cardiovascular Disease

## 2022-03-24 ENCOUNTER — Encounter: Payer: Self-pay | Admitting: Family Medicine

## 2022-03-24 ENCOUNTER — Ambulatory Visit (INDEPENDENT_AMBULATORY_CARE_PROVIDER_SITE_OTHER): Payer: Managed Care, Other (non HMO) | Admitting: Family Medicine

## 2022-03-24 VITALS — BP 112/68 | HR 77 | Temp 98.4°F | Ht 65.5 in | Wt 253.8 lb

## 2022-03-24 DIAGNOSIS — Z23 Encounter for immunization: Secondary | ICD-10-CM

## 2022-03-24 DIAGNOSIS — J449 Chronic obstructive pulmonary disease, unspecified: Secondary | ICD-10-CM | POA: Diagnosis not present

## 2022-03-24 DIAGNOSIS — F419 Anxiety disorder, unspecified: Secondary | ICD-10-CM | POA: Insufficient documentation

## 2022-03-24 DIAGNOSIS — Z0000 Encounter for general adult medical examination without abnormal findings: Secondary | ICD-10-CM | POA: Diagnosis not present

## 2022-03-24 DIAGNOSIS — E785 Hyperlipidemia, unspecified: Secondary | ICD-10-CM

## 2022-03-24 DIAGNOSIS — I10 Essential (primary) hypertension: Secondary | ICD-10-CM

## 2022-03-24 DIAGNOSIS — N183 Chronic kidney disease, stage 3 unspecified: Secondary | ICD-10-CM

## 2022-03-24 DIAGNOSIS — E1169 Type 2 diabetes mellitus with other specified complication: Secondary | ICD-10-CM

## 2022-03-24 DIAGNOSIS — E1165 Type 2 diabetes mellitus with hyperglycemia: Secondary | ICD-10-CM | POA: Diagnosis not present

## 2022-03-24 DIAGNOSIS — K760 Fatty (change of) liver, not elsewhere classified: Secondary | ICD-10-CM

## 2022-03-24 DIAGNOSIS — Z1231 Encounter for screening mammogram for malignant neoplasm of breast: Secondary | ICD-10-CM

## 2022-03-24 DIAGNOSIS — I5030 Unspecified diastolic (congestive) heart failure: Secondary | ICD-10-CM

## 2022-03-24 DIAGNOSIS — I739 Peripheral vascular disease, unspecified: Secondary | ICD-10-CM

## 2022-03-24 MED ORDER — BUPROPION HCL ER (XL) 150 MG PO TB24: 150.0000 mg | ORAL_TABLET | Freq: Every day | ORAL | 3 refills | Status: DC

## 2022-03-24 NOTE — Patient Instructions (Addendum)
Shingrix #1 today. Repeat injection in 2-5 months. Schedule a nurse visit for the 2nd injection before you leave today (at the check out desk) ? ?Tdap next visit ? ?Start farxiga 5 mg- continue to remain well hydrated ? ?[place] ?Schedule an appointment by calling [phone]. ? ?Please stop by lab before you go ?If you have mychart- we will send your results within 3 business days of [name] receiving them.  ?If you do not have mychart- we will call you about results within 5 business days of [name] receiving them.  ?*please also note that you will see labs on mychart as soon as they post. I will later go in and write notes on them- will say "notes from Dr. Yong Channel"  ? ?Recommended follow up: Return in about 1 month (around 04/23/2022) for followup or sooner if needed.Schedule b4 you leave. ?

## 2022-03-24 NOTE — Addendum Note (Signed)
Addended by: Clyde Lundborg A on: 03/24/2022 03:03 PM ? ? Modules accepted: Orders ? ?

## 2022-03-25 LAB — CBC WITH DIFFERENTIAL/PLATELET
Basophils Absolute: 0.1 10*3/uL (ref 0.0–0.1)
Basophils Relative: 0.9 % (ref 0.0–3.0)
Eosinophils Absolute: 0.1 10*3/uL (ref 0.0–0.7)
Eosinophils Relative: 1.7 % (ref 0.0–5.0)
HCT: 45.3 % (ref 36.0–46.0)
Hemoglobin: 15.2 g/dL — ABNORMAL HIGH (ref 12.0–15.0)
Lymphocytes Relative: 32 % (ref 12.0–46.0)
Lymphs Abs: 2.1 10*3/uL (ref 0.7–4.0)
MCHC: 33.4 g/dL (ref 30.0–36.0)
MCV: 90.5 fl (ref 78.0–100.0)
Monocytes Absolute: 0.5 10*3/uL (ref 0.1–1.0)
Monocytes Relative: 7.1 % (ref 3.0–12.0)
Neutro Abs: 3.8 10*3/uL (ref 1.4–7.7)
Neutrophils Relative %: 58.3 % (ref 43.0–77.0)
Platelets: 276 10*3/uL (ref 150.0–400.0)
RBC: 5.01 Mil/uL (ref 3.87–5.11)
RDW: 14.2 % (ref 11.5–15.5)
WBC: 6.6 10*3/uL (ref 4.0–10.5)

## 2022-03-25 LAB — COMPREHENSIVE METABOLIC PANEL
ALT: 17 U/L (ref 0–35)
AST: 13 U/L (ref 0–37)
Albumin: 4.2 g/dL (ref 3.5–5.2)
Alkaline Phosphatase: 63 U/L (ref 39–117)
BUN: 30 mg/dL — ABNORMAL HIGH (ref 6–23)
CO2: 22 mEq/L (ref 19–32)
Calcium: 9.9 mg/dL (ref 8.4–10.5)
Chloride: 104 mEq/L (ref 96–112)
Creatinine, Ser: 1.1 mg/dL (ref 0.40–1.20)
GFR: 55.38 mL/min — ABNORMAL LOW (ref >60.00)
Glucose, Bld: 111 mg/dL — ABNORMAL HIGH (ref 70–99)
Potassium: 4.1 mEq/L (ref 3.5–5.1)
Sodium: 136 mEq/L (ref 135–145)
Total Bilirubin: 0.4 mg/dL (ref 0.2–1.2)
Total Protein: 7.7 g/dL (ref 6.0–8.3)

## 2022-03-25 LAB — LDL CHOLESTEROL, DIRECT: Direct LDL: 144 mg/dL

## 2022-03-25 LAB — TSH: TSH: 1.54 u[IU]/mL (ref 0.35–5.50)

## 2022-04-03 ENCOUNTER — Ambulatory Visit
Admission: RE | Admit: 2022-04-03 | Discharge: 2022-04-03 | Disposition: A | Discharge status: Home / Self Care | Payer: Managed Care, Other (non HMO) | Source: Ambulatory Visit | Attending: Family Medicine | Admitting: Family Medicine

## 2022-04-03 DIAGNOSIS — Z0000 Encounter for general adult medical examination without abnormal findings: Secondary | ICD-10-CM

## 2022-04-03 DIAGNOSIS — Z1231 Encounter for screening mammogram for malignant neoplasm of breast: Secondary | ICD-10-CM

## 2022-04-24 ENCOUNTER — Encounter: Payer: Self-pay | Admitting: Family Medicine

## 2022-04-24 ENCOUNTER — Ambulatory Visit (INDEPENDENT_AMBULATORY_CARE_PROVIDER_SITE_OTHER): Payer: Managed Care, Other (non HMO) | Admitting: Family Medicine

## 2022-04-24 VITALS — BP 130/70 | HR 78 | Temp 98.3°F | Ht 65.5 in | Wt 257.6 lb

## 2022-04-24 DIAGNOSIS — I10 Essential (primary) hypertension: Secondary | ICD-10-CM

## 2022-04-24 DIAGNOSIS — E1165 Type 2 diabetes mellitus with hyperglycemia: Secondary | ICD-10-CM

## 2022-04-24 DIAGNOSIS — I5030 Unspecified diastolic (congestive) heart failure: Secondary | ICD-10-CM

## 2022-04-24 MED ORDER — DAPAGLIFLOZIN PROPANEDIOL 5 MG PO TABS: 5.0000 mg | ORAL_TABLET | Freq: Every day | ORAL | 11 refills | Status: DC

## 2022-04-24 NOTE — Progress Notes (Signed)
Phone [phone] In person visit   Subjective:   Lisa [name] is a 61 y.o. year old very pleasant female patient who presents for/with See problem oriented charting Chief Complaint  Patient presents with   Follow-up   ruq pain    Pt c/o ruq pain.   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 ([place]) 01/14/2017    Priority: High   Morbid obesity ([place]) 02/14/2016    Priority: High   Tobacco use disorder 08/12/2015    Priority: High   COPD (chronic obstructive pulmonary disease) ([place]) 01/18/2015    Priority: High   Anxiety 03/24/2022    Priority: Medium    Aortic atherosclerosis ([place]) 09/10/2021    Priority: Medium    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    Priority: Medium    CKD (chronic kidney disease), stage III ([place]) 09/18/2020    Priority: Medium    Fatty liver 11/06/2019    Priority: Medium    Hyperlipidemia 04/12/2018    Priority: Medium    Hyperlipidemia associated with type 2 diabetes mellitus ([place]) 09/06/2017    Priority: Medium    Breast mass 08/03/2017    Priority: Medium    GERD (gastroesophageal reflux disease) 02/14/2016    Priority: Medium    DOE (dyspnea on exertion) 03/08/2014    Priority: Medium    Migraine without aura 07/26/2013    Priority: Medium    Sleep apnea 02/11/2012    Priority: Medium    Chest discomfort 10/12/2011    Priority: Medium    Essential hypertension 10/12/2011    Priority: Medium    Chronic rhinitis 06/26/2019    Priority: Low   Trigger finger, right middle finger 05/04/2019    Priority: Low    Bilateral leg pain 04/14/2018    Priority: Low   S/P bronchoscopy with biopsy     Priority: Low   Chronic hip pain 07/29/2017    Priority: Low   Left knee pain 05/15/2016    Priority: Low   Flank pain 04/21/2016    Priority: Low   Abdominal pain 02/28/2016    Priority: Low   Pulmonary nodule, right 04/11/2015    Priority: Low   Mediastinal lymphadenopathy 01/18/2015    Priority: Low    Medications- reviewed and updated Current Outpatient Medications  Medication Sig Dispense Refill   ASPIRIN 81 PO Take 81 mg by mouth daily.     buPROPion (WELLBUTRIN XL) 150 MG 24 hr tablet Take 1 tablet (150 mg total) by mouth daily. 90 tablet 3   carvedilol (COREG) 25 MG tablet Take 1 tablet (25 mg total) by mouth 2 (two) times daily. 180 tablet 3   dicyclomine (BENTYL) 10 MG capsule Take 1 capsule (10 mg total) by mouth 3 (three) times daily before meals. As needed 270 capsule 3   NEXLIZET 180-10 MG TABS TAKE 1 TABLET BY MOUTH EVERY DAY 30 tablet 0   ONE TOUCH LANCETS MISC Use to check blood sugars twice a day 200 each 0   pantoprazole (PROTONIX) 40 MG tablet TAKE 1 TABLET BY MOUTH 30  MINS BEFORE BREAKFAST DAILY. 90 tablet 1   telmisartan (MICARDIS) 40 MG tablet Take 1 tablet (40 mg total) by mouth daily. 90 tablet 3   dapagliflozin propanediol (FARXIGA) 5 MG TABS tablet Take 1 tablet (5 mg total) by mouth daily before breakfast. 30 tablet 11   No current facility-administered medications for this visit.     Objective:  BP 130/70   Pulse 78   Temp 98.3 F (36.8 C)   Ht 5' 5.5" (1.664 m)   Wt 257 lb 9.6 oz (116.8 kg)   SpO2 98%   BMI 42.21 kg/m  Gen: NAD, resting comfortably CV: RRR no murmurs rubs or gallops Lungs: CTAB no crackles, wheeze, rhonchi Ext: trace edema Skin: warm, dry    Assessment and Plan   # Anxiety/stress S:medication: wellbutrin 150 mg XR -reached a peak at work and had an episode of significant breakdown/crying- was exhausted- went home taht day and was very  refreshing and has been doing better since that time.  - has not felt big benefit for her smoking  A/P: much improved control per patient report- continue curren tmeds -encouraged full cessation- she is not ready to quit  #Right upper quadrant pain/lower chest  S:  occasionally gets pain in RUQ despite gallbladder being gone- can hurt into ribs- holding area is helpful. Pain at worst up to 4/10 - super rarely more- can happen randomly through day. Going on several months including march when she had ct abdomen and pelvis A/P: no obvious cause on CT 01/28/22- also had bloodwork since that time- will update today since doing a1c  #hypertension/ diastolic CHF S: medication: Telmisartan 40 mg daily, Coreg 25 mg twice a day -Remains off spironolactone and weight is increased further (she states was having frequent urination on this and some incontinence.  Encouraged starting [place] for diabetes- she did not start this - no increased edema but weight up 4 lbs BP Readings from Last 3 Encounters:  04/24/22 130/70  03/24/22 112/68  02/26/22 (!) 152/90  A/P: Blood pressure is well controlled.  CHF without worsening edema or shortness of breath but does have some weight gain-possibly fluid related-I think starting [place] will be helpful if she can tolerate it from a urination perspective   # Diabetes S: Medication:none , but prescribed farxiga Exercise and diet- encouraged to start exercising- wants to start back on track Lab Results  Component Value Date   HGBA1C 8.2 (H) 01/15/2022   HGBA1C 9.5 (H) 09/08/2021   HGBA1C 6.5 03/21/2021   A/P: concerned could be worsening- update a1c- she is willing to trial farxiga- I told her if she does not tolerate do not wait to see Dr. Steva Ready for months- see ASAP for next steps  Recommended follow up: Return in about 6 months (around 10/25/2022) for followup or sooner if needed.Schedule b4 you leave.  Lab/Order associations:   ICD-10-CM   1. Type 2 diabetes  mellitus with hyperglycemia, without long-term current use of insulin (HCC)  E11.65 CBC with Differential/Platelet    Comprehensive metabolic panel    Hemoglobin A1c    2. Essential hypertension  I10 CBC with Differential/Platelet    Comprehensive metabolic panel    3. Diastolic congestive heart failure, unspecified HF chronicity (HCC)  I50.30      Meds ordered this encounter  Medications   dapagliflozin propanediol (FARXIGA) 5 MG TABS tablet    Sig: Take 1 tablet (5 mg total) by mouth daily before breakfast.    Dispense:  30 tablet  Refill:  11    Return precautions advised.  Garret Reddish, MD

## 2022-04-24 NOTE — Addendum Note (Signed)
Addended by: Doran Clay A on: 04/24/2022 03:16 PM   Modules accepted: Orders

## 2022-04-24 NOTE — Patient Instructions (Addendum)
Please stop by lab before you go If you have mychart- we will send your results within 3 business days of [name] receiving them.  If you do not have mychart- we will call you about results within 5 business days of [name] receiving them.  *please also note that you will see labs on mychart as soon as they post. I will later go in and write notes on them- will say "notes from Dr. Yong Channel"   Start farxiga. Make sure to see endocrine (not currently scheduled)  Recommended follow up: Return in about 6 months (around 10/25/2022) for followup or sooner if needed.Schedule b4 you leave.

## 2022-04-25 LAB — CBC WITH DIFFERENTIAL/PLATELET
Absolute Monocytes: 497 cells/uL (ref 200–950)
Basophils Absolute: 29 cells/uL (ref 0–200)
Basophils Relative: 0.4 %
Eosinophils Absolute: 137 cells/uL (ref 15–500)
Eosinophils Relative: 1.9 %
HCT: 45.2 % — ABNORMAL HIGH (ref 35.0–45.0)
Hemoglobin: 15.4 g/dL (ref 11.7–15.5)
Lymphs Abs: 2678 cells/uL (ref 850–3900)
MCH: 30.1 pg (ref 27.0–33.0)
MCHC: 34.1 g/dL (ref 32.0–36.0)
MCV: 88.3 fL (ref 80.0–100.0)
MPV: 10.7 fL (ref 7.5–12.5)
Monocytes Relative: 6.9 %
Neutro Abs: 3859 cells/uL (ref 1500–7800)
Neutrophils Relative %: 53.6 %
Platelets: 282 10*3/uL (ref 140–400)
RBC: 5.12 10*6/uL — ABNORMAL HIGH (ref 3.80–5.10)
RDW: 12.6 % (ref 11.0–15.0)
Total Lymphocyte: 37.2 %
WBC: 7.2 10*3/uL (ref 3.8–10.8)

## 2022-04-25 LAB — COMPREHENSIVE METABOLIC PANEL
AG Ratio: 1.2 (calc) (ref 1.0–2.5)
ALT: 28 U/L (ref 6–29)
AST: 19 U/L (ref 10–35)
Albumin: 4.2 g/dL (ref 3.6–5.1)
Alkaline phosphatase (APISO): 81 U/L (ref 37–153)
BUN/Creatinine Ratio: 19 (calc) (ref 6–22)
BUN: 23 mg/dL (ref 7–25)
CO2: 18 mmol/L — ABNORMAL LOW (ref 20–32)
Calcium: 10.2 mg/dL (ref 8.6–10.4)
Chloride: 108 mmol/L (ref 98–110)
Creat: 1.21 mg/dL — ABNORMAL HIGH (ref 0.50–1.03)
Globulin: 3.5 g/dL (calc) (ref 1.9–3.7)
Glucose, Bld: 236 mg/dL — ABNORMAL HIGH (ref 65–99)
Potassium: 4.2 mmol/L (ref 3.5–5.3)
Sodium: 141 mmol/L (ref 135–146)
Total Bilirubin: 0.4 mg/dL (ref 0.2–1.2)
Total Protein: 7.7 g/dL (ref 6.1–8.1)

## 2022-04-25 LAB — HEMOGLOBIN A1C
Hgb A1c MFr Bld: 8.6 % of total Hgb — ABNORMAL HIGH (ref <5.7)
Mean Plasma Glucose: 200 mg/dL
eAG (mmol/L): 11.1 mmol/L

## 2022-05-01 ENCOUNTER — Other Ambulatory Visit: Payer: Self-pay | Admitting: Cardiovascular Disease

## 2022-05-05 ENCOUNTER — Encounter: Payer: Self-pay | Admitting: Family Medicine

## 2022-05-11 ENCOUNTER — Telehealth: Payer: Self-pay

## 2022-05-11 NOTE — Telephone Encounter (Signed)
Patient called in states that farxiga is causing yeast infections. She needs an alternate medication. PCP told her to stop medication immediately. She cannot tolerate metformin or ozempic

## 2022-05-12 NOTE — Telephone Encounter (Signed)
Attempted to call patient and no answer. Vm left to call back

## 2022-05-15 ENCOUNTER — Other Ambulatory Visit: Payer: Self-pay | Admitting: Cardiovascular Disease

## 2022-06-04 ENCOUNTER — Encounter: Payer: Self-pay | Admitting: Pharmacist

## 2022-06-04 ENCOUNTER — Telehealth: Payer: Self-pay | Admitting: Pharmacist

## 2022-06-04 MED ORDER — REPATHA SURECLICK 140 MG/ML ~~LOC~~ SOAJ: 1.0000 | SUBCUTANEOUS | 3 refills | Status: DC

## 2022-06-04 NOTE — Telephone Encounter (Signed)
Called pt, her formulary is changing from [place] to [place]. Praluent was d/c from her med list at PCP visit earlier this year, she isn't sure why, note maybe mentions cost. 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 LDL elevated at 144 a few months ago off therapy. Will send in rx for Repatha, pt aware to start therapy. Copay card activated and sent to pt via MyChart message.

## 2022-06-10 ENCOUNTER — Ambulatory Visit: Payer: Managed Care, Other (non HMO) | Admitting: Internal Medicine

## 2022-06-10 ENCOUNTER — Encounter: Payer: Self-pay | Admitting: Internal Medicine

## 2022-06-10 VITALS — BP 136/84 | HR 80 | Ht 65.5 in | Wt 256.0 lb

## 2022-06-10 DIAGNOSIS — E042 Nontoxic multinodular goiter: Secondary | ICD-10-CM

## 2022-06-10 DIAGNOSIS — E1165 Type 2 diabetes mellitus with hyperglycemia: Secondary | ICD-10-CM | POA: Diagnosis not present

## 2022-06-10 DIAGNOSIS — E1122 Type 2 diabetes mellitus with diabetic chronic kidney disease: Secondary | ICD-10-CM | POA: Diagnosis not present

## 2022-06-10 DIAGNOSIS — N1831 Chronic kidney disease, stage 3a: Secondary | ICD-10-CM | POA: Diagnosis not present

## 2022-06-10 LAB — POCT GLUCOSE (DEVICE FOR HOME USE): Glucose Fasting, POC: 330 mg/dL — AB (ref 70–99)

## 2022-06-10 MED ORDER — TRULICITY 0.75 MG/0.5ML ~~LOC~~ SOAJ: 0.7500 mg | SUBCUTANEOUS | 3 refills | Status: DC

## 2022-06-10 NOTE — Progress Notes (Signed)
Name: Lisa [name]  Age/ Sex: 61 y.o., female   MRN/ DOB: [id number], [birth year]     PCP: Marin Olp, MD   Reason for Endocrinology Evaluation: Type 2 Diabetes Mellitus/ Abnormal FNA  Initial Endocrine Consultative Visit: 05/23/2021    PATIENT IDENTIFIER: Lisa [name] is a 61 y.o. female with a past medical history of HTN, Migraine headaches, V7OH, diastolic dysfunction and PAD. The patient has followed with Endocrinology clinic since 05/23/2021 for consultative assistance with management of her diabetes.      DIABETIC HISTORY:  Lisa [name] was diagnosed with DM 2018. Was on Metformin but was stopped due to low GFR.Ozempic caused GI side effects . Her hemoglobin A1c has ranged from 6.5% in 02/2021, peaking at 9.5% in 08/2021.   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     SUBJECTIVE:    Today (06/10/2022): Lisa [name] is here for a follow up on diabetes management and MNG.  She checks her blood sugars 1 times daily. She did not bring her meter today .The patient has not had hypoglycemic episodes since the last clinic visit.  She stopped [place] but stopped due to horrible yeast infection   Denies local neck swelling  She is scared of medications and side effects     HOME DIABETES REGIMEN:  Farxiga 5 mg daily- not taking    Statin: no, she is on Praluent  ACE-I/ARB: yes Prior Diabetic Education: No   METER DOWNLOAD SUMMARY: Did not bring it    DIABETIC COMPLICATIONS:  Macrovascular  complications:   Denies: CAD, CVA, PVD   HISTORY:  Past Medical History:  Past Medical History:  Diagnosis Date   Allergic rhinitis    Arthritis    Asthma    Breast nodule    right breast, to see dr Helane Rima 06-20-2012 for    Chest pain, non-cardiac    Chronic headaches    Cigarette smoker    Complication of anesthesia 05-17-2012   trouble breathing after colonscopy, needed nebulizer   COPD (chronic obstructive pulmonary disease) ([place])    Diastolic congestive heart failure ([place]) 04/17/2019   Diverticulosis    GERD (gastroesophageal reflux disease)    Hepatic hemangioma    Hypertension    IBS (irritable bowel syndrome)    Nuclear stress test    Myoview 05/2019: EF 63, no ischemia. Low Risk   Obesity    Prosthetic eye globe    Pyloric erosion    Sleep apnea    CPAP setting varies from 4-10   Type 2 diabetes mellitus ([place]) 03/11/2017   Past Surgical History:  Past Surgical History:  Procedure Laterality Date   ABDOMINAL AORTOGRAM W/LOWER EXTREMITY N/A 04/30/2017   Procedure: Abdominal Aortogram w/Lower Extremity;  Surgeon: Elam Dutch, MD;  Location: [facility];  Service: Cardiovascular;  Laterality: N/A;   CARDIAC CATHETERIZATION  12/28/2007   EF 75%. IT REVEALS NORMAL/SUPRANORMAL LEFT VENTRICULAR SYSTOLIC FUNCTION   CARDIOVASCULAR STRESS TEST  03/25/2007   EF 78%   CESAREAN SECTION  2003,  2009   x 2   CHOLECYSTECTOMY  1993   COLONOSCOPY  2014   ENDOMETRIAL ABLATION  09/2010   and D&C   HERNIA REPAIR  3474   umbilical   LAPAROSCOPY     PERIPHERAL VASCULAR BALLOON ANGIOPLASTY Left 04/30/2017   Procedure: Peripheral Vascular Balloon Angioplasty;  Surgeon: Elam Dutch, MD;  Location: [facility];  Service: Cardiovascular;  Laterality: Left;  SFA   RECONSTRUCTION OF EYELID Bilateral    TUBAL LIGATION     UMBILICAL HERNIA REPAIR  2004   US ECHOCARDIOGRAPHY  05/08/2008   EF 55-60%   VENTRAL HERNIA REPAIR  06/15/2012   Procedure: LAPAROSCOPIC VENTRAL  HERNIA;  Surgeon: Edward Jolly, MD;  Location: [facility];  Service: General;  Laterality: N/A;  LAPAROSCOPIC REPAIR VENTRAL HERNIA   VIDEO BRONCHOSCOPY Bilateral 11/15/2017   Procedure: VIDEO BRONCHOSCOPY WITH FLUORO;  Surgeon: Collene Gobble, MD;  Location: [facility];  Service: Cardiopulmonary;  Laterality: Bilateral;   Social History:  reports that she has been smoking cigarettes. She has a 57.00 pack-year smoking history. She has never used smokeless tobacco. She reports that she does not drink alcohol and does not use drugs. Family History:  Family History  Problem Relation Age of Onset   Hypertension Mother    Lung cancer Mother        65   Heart disease Father        pacemaker or defibrillator   Breast cancer Maternal Aunt 78   Heart attack Paternal Uncle    Diabetes Maternal Grandmother    Colon cancer Neg Hx    Esophageal cancer Neg Hx    Pancreatic cancer Neg Hx      HOME MEDICATIONS: Allergies as of 06/10/2022       Reactions   Almond Oil Anaphylaxis, Nausea And Vomiting   Almond-Nuts Only   Apple Juice Anaphylaxis   Ceftin [cefuroxime] Other (See Comments)   Severe stomach pain - Diverticulitis flares up   Cherry Extract Anaphylaxis   Cherry fruit only   Other Anaphylaxis, Itching   Most fruits- Are NOT tolerated (PLEASE ASK PATIENT BEFORE GIVING, AS CERTAIN FRUITS ARE TOLERATED IN LIMITED QUANTITIES!!)   Guaifenesin & Derivatives Other (See Comments)   Restlessness, jittery   Hctz [hydrochlorothiazide] Other (See Comments)   Causes a headache   Naproxen Other (See Comments)   Insomnia   Lisinopril Cough           Medication List        Accurate as of June 10, 2022  9:34 AM. If you have any questions, ask your nurse or doctor.          ASPIRIN 81 PO Take 81 mg by mouth daily.   buPROPion 150 MG 24 hr tablet Commonly known as: WELLBUTRIN XL Take 1 tablet (150 mg total) by mouth daily.   carvedilol 25 MG tablet Commonly known as:  COREG Take 1 tablet (25 mg total) by mouth 2 (two) times daily.   dapagliflozin propanediol 5 MG Tabs tablet Commonly known as: Farxiga Take 1 tablet (5 mg total) by mouth daily before breakfast.   dicyclomine 10 MG capsule Commonly known as: BENTYL Take 1 capsule (10 mg total) by mouth 3 (three) times daily before meals. As needed   Nexlizet 180-10 MG Tabs Generic drug: Bempedoic Acid-Ezetimibe Take 1 tablet by mouth daily. Please call [phone] to schedule an overdue appointment for future refills. Thank you. 3rd attempt.   ONE TOUCH LANCETS Misc  Use to check blood sugars twice a day   pantoprazole 40 MG tablet Commonly known as: PROTONIX TAKE 1 TABLET BY MOUTH 30 MINS BEFORE BREAKFAST DAILY.   Repatha SureClick 235 MG/ML [place] Generic drug: Evolocumab Inject 1 Pen into the skin every 14 (fourteen) days.   telmisartan 40 MG tablet Commonly known as: Micardis Take 1 tablet (40 mg total) by mouth daily.         OBJECTIVE:   Vital Signs: BP 136/84 (BP Location: Left Arm, Patient Position: Sitting, Cuff Size: Large)   Pulse 80   Ht 5' 5.5" (1.664 m)   Wt 256 lb (116.1 kg)   SpO2 96%   BMI 41.95 kg/m   Wt Readings from Last 3 Encounters:  06/10/22 256 lb (116.1 kg)  04/24/22 257 lb 9.6 oz (116.8 kg)  03/24/22 253 lb 12.8 oz (115.1 kg)     Exam: General: Pt appears well and is in NAD  Neck: General: Supple without adenopathy. Thyroid: Thyroid size normal.  Bilateral thyroid nodules appreciated  Lungs: Clear with good BS bilat with no rales, rhonchi, or wheezes  Heart: RRR   Extremities: No pretibial edema.   Neuro: MS is good with appropriate affect, pt is alert and Ox3          DATA REVIEWED:  Lab Results  Component Value Date   HGBA1C 8.6 (H) 04/24/2022   HGBA1C 8.2 (H) 01/15/2022   HGBA1C 9.5 (H) 09/08/2021   Lab Results  Component Value Date   LDLCALC 57 06/19/2020   CREATININE 1.21 (H) 04/24/2022   Lab Results  Component Value Date    MICRALBCREAT 28.6 04/22/2017     Lab Results  Component Value Date   CHOL 147 09/08/2021   HDL 23.50 (L) 09/08/2021   LDLCALC 57 06/19/2020   LDLDIRECT 144.0 03/24/2022   TRIG 392.0 (H) 09/08/2021   CHOLHDL 6 09/08/2021       Thyroid Ultrasound 04/17/2021   Estimated total number of nodules >/= 1 cm: 2   Number of spongiform nodules >/=  2 cm not described below (TR1): 0   Number of mixed cystic and solid nodules >/= 1.5 cm not described below ([place]): 0   _________________________________________________________   There is an approximately 0.9 x 0.9 x 0.7 cm isoechoic ill-defined nodule/pseudonodule within the posteroinferior aspect the right lobe of the thyroid (labeled 1), which does not meet criteria to recommend percutaneous sampling or continued dedicated follow-up.   _________________________________________________________   Nodule # 2:   Location: Left; Superior   Maximum size: 1.0 cm; Other 2 dimensions: 1.0 x 0.6 cm   Composition: solid/almost completely solid (2)   Echogenicity: hypoechoic (2)   Shape: not taller-than-wide (0)   Margins: smooth (0)   Echogenic foci: none (0)   ACR TI-RADS total points: 4.   ACR TI-RADS risk category: TR4 (4-6 points).   ACR TI-RADS recommendations:   *Given size (>/= 1 - 1.4 cm) and appearance, a follow-up ultrasound in 1 year should be considered based on TI-RADS criteria.   _________________________________________________________   Nodule # 3:   Location: Left; Inferior   Maximum size: 2.6 cm; Other 2 dimensions: 2.4 x 2.0 cm   Composition: solid/almost completely solid (2)   Echogenicity: hypoechoic (2)   Shape: not taller-than-wide (0)   Margins: smooth (0)   Echogenic foci: none (0)   ACR TI-RADS total points: 4.   ACR TI-RADS risk category: TR4 (4-6 points).   ACR TI-RADS recommendations:   **Given size (>/=  1.5 cm) and appearance, fine needle aspiration of this moderately suspicious  nodule should be considered based on TI-RADS criteria.   _________________________________________________________   IMPRESSION: 1. Findings suggestive of multinodular goiter. 2. Nodule #3 meets imaging criteria to recommend percutaneous sampling as indicated. 3. Nodule #2 meets imaging criteria to recommend a 1 year follow-up.      FNA 04/22/2021   Clinical History: Left; Inferior 2.6cm; Other 2 dimensions: 2.4 x 2.0cm,  Solid /almost completely solid, Hypoechoic, TI-RADS total points 4.  Specimen Submitted:  A. 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 Goal A1c < 7.0 %.    -She has been on metformin in the past, I suspect this has been discontinued due to low GFR at some point  -She has been on Ozempic but this caused GI issues  -I prescribed Wilder Glade but this caused severe yeast infection , so she stopped it and had no intention of restarting  -She declines insulin as well  -We also discussed sulfonylureas as an option, but she declines due to weight gain  -She had declined  a referral to our CDE  - I explained to Lisa [name] that I am out of options to help her. As there's no medication that does not come with a list of side effects and I can not guarantee that she would not experience any  - She acknowledges that she may need to seek therapy which I agree with but she had fear that is paralyzing her decision making process . I explained to the pt that decision making process involved  weighing the risk and the benefits, The risk of blindness, ESRD or high risk of amputations which are not reversible  vs the risk of the possibility of side effects with  medications which are usually reversible  - After much discussion ,  she opted to try Trulicity but she understand she may experience similar side effects to Ozempic  - She is working on diet and lifestyle changes   MEDICATIONS: Start Trulicity 1.61 mg weekly   EDUCATION / INSTRUCTIONS: BG monitoring instructions: Patient is instructed to check her blood sugars 1 times a day, fasting. Call [facility] if: BG persistently < 70 I reviewed the Rule of 15 for the treatment of hypoglycemia in detail with the patient. Literature supplied.     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  - We discussed  proceeding with repeat ultrasound      F/U in 4 months   Signed electronically by: Mack Guise, MD  [facility]  [facility] [address] [place], [place] [address] Phone: [phone] FAX: [phone]   CC: Marin Olp, [place] [address] Phone: [phone]  Fax: [phone]  Return to Endocrinology clinic as below: Future Appointments  Date Time Provider [place]  10/26/2022  2:20 PM Marin Olp, MD  [facility]

## 2022-06-10 NOTE — Patient Instructions (Signed)
-   Start Trulicity 2.70 mg weekly

## 2022-06-13 ENCOUNTER — Emergency Department (HOSPITAL_COMMUNITY)
Admission: EM | Admit: 2022-06-13 | Discharge: 2022-06-14 | Disposition: A | Discharge status: Home / Self Care | Payer: Managed Care, Other (non HMO) | Attending: Emergency Medicine | Admitting: Emergency Medicine

## 2022-06-13 DIAGNOSIS — D1803 Hemangioma of intra-abdominal structures: Secondary | ICD-10-CM | POA: Diagnosis not present

## 2022-06-13 DIAGNOSIS — K0889 Other specified disorders of teeth and supporting structures: Secondary | ICD-10-CM | POA: Diagnosis not present

## 2022-06-13 DIAGNOSIS — E119 Type 2 diabetes mellitus without complications: Secondary | ICD-10-CM | POA: Diagnosis not present

## 2022-06-13 DIAGNOSIS — Z7982 Long term (current) use of aspirin: Secondary | ICD-10-CM | POA: Diagnosis not present

## 2022-06-13 DIAGNOSIS — J449 Chronic obstructive pulmonary disease, unspecified: Secondary | ICD-10-CM | POA: Diagnosis not present

## 2022-06-13 DIAGNOSIS — F17210 Nicotine dependence, cigarettes, uncomplicated: Secondary | ICD-10-CM | POA: Diagnosis not present

## 2022-06-13 DIAGNOSIS — K5792 Diverticulitis of intestine, part unspecified, without perforation or abscess without bleeding: Secondary | ICD-10-CM | POA: Diagnosis not present

## 2022-06-13 DIAGNOSIS — R109 Unspecified abdominal pain: Secondary | ICD-10-CM | POA: Diagnosis present

## 2022-06-13 DIAGNOSIS — Z79899 Other long term (current) drug therapy: Secondary | ICD-10-CM | POA: Insufficient documentation

## 2022-06-13 DIAGNOSIS — K760 Fatty (change of) liver, not elsewhere classified: Secondary | ICD-10-CM | POA: Diagnosis not present

## 2022-06-13 DIAGNOSIS — I110 Hypertensive heart disease with heart failure: Secondary | ICD-10-CM | POA: Insufficient documentation

## 2022-06-13 DIAGNOSIS — I5030 Unspecified diastolic (congestive) heart failure: Secondary | ICD-10-CM | POA: Insufficient documentation

## 2022-06-14 ENCOUNTER — Encounter (HOSPITAL_COMMUNITY): Payer: Self-pay

## 2022-06-14 ENCOUNTER — Emergency Department (HOSPITAL_COMMUNITY): Payer: Managed Care, Other (non HMO)

## 2022-06-14 LAB — URINALYSIS, ROUTINE W REFLEX MICROSCOPIC
Bilirubin Urine: NEGATIVE
Glucose, UA: NEGATIVE mg/dL
Ketones, ur: NEGATIVE mg/dL
Leukocytes,Ua: NEGATIVE
Nitrite: NEGATIVE
Protein, ur: NEGATIVE mg/dL
Specific Gravity, Urine: 1.02 (ref 1.005–1.030)
pH: 5 (ref 5.0–8.0)

## 2022-06-14 LAB — CBC WITH DIFFERENTIAL/PLATELET
Abs Immature Granulocytes: 0.04 10*3/uL (ref 0.00–0.07)
Basophils Absolute: 0 10*3/uL (ref 0.0–0.1)
Basophils Relative: 0 %
Eosinophils Absolute: 0.1 10*3/uL (ref 0.0–0.5)
Eosinophils Relative: 1 %
HCT: 46.2 % — ABNORMAL HIGH (ref 36.0–46.0)
Hemoglobin: 15.7 g/dL — ABNORMAL HIGH (ref 12.0–15.0)
Immature Granulocytes: 0 %
Lymphocytes Relative: 15 %
Lymphs Abs: 1.6 10*3/uL (ref 0.7–4.0)
MCH: 30.3 pg (ref 26.0–34.0)
MCHC: 34 g/dL (ref 30.0–36.0)
MCV: 89.2 fL (ref 80.0–100.0)
Monocytes Absolute: 0.8 10*3/uL (ref 0.1–1.0)
Monocytes Relative: 7 %
Neutro Abs: 8.5 10*3/uL — ABNORMAL HIGH (ref 1.7–7.7)
Neutrophils Relative %: 77 %
Platelets: 256 10*3/uL (ref 150–400)
RBC: 5.18 MIL/uL — ABNORMAL HIGH (ref 3.87–5.11)
RDW: 12.7 % (ref 11.5–15.5)
WBC: 11 10*3/uL — ABNORMAL HIGH (ref 4.0–10.5)
nRBC: 0 % (ref 0.0–0.2)

## 2022-06-14 LAB — COMPREHENSIVE METABOLIC PANEL
ALT: 24 U/L (ref 0–44)
AST: 14 U/L — ABNORMAL LOW (ref 15–41)
Albumin: 3.6 g/dL (ref 3.5–5.0)
Alkaline Phosphatase: 80 U/L (ref 38–126)
Anion gap: 13 (ref 5–15)
BUN: 19 mg/dL (ref 6–20)
CO2: 24 mmol/L (ref 22–32)
Calcium: 9.6 mg/dL (ref 8.9–10.3)
Chloride: 99 mmol/L (ref 98–111)
Creatinine, Ser: 1.12 mg/dL — ABNORMAL HIGH (ref 0.44–1.00)
GFR, Estimated: 57 mL/min — ABNORMAL LOW (ref >60)
Glucose, Bld: 232 mg/dL — ABNORMAL HIGH (ref 70–99)
Potassium: 3.6 mmol/L (ref 3.5–5.1)
Sodium: 136 mmol/L (ref 135–145)
Total Bilirubin: 0.8 mg/dL (ref 0.3–1.2)
Total Protein: 7.4 g/dL (ref 6.5–8.1)

## 2022-06-14 MED ORDER — KETOROLAC TROMETHAMINE 30 MG/ML IJ SOLN
15.0000 mg | Freq: Once | INTRAMUSCULAR | Status: AC
  Administered 2022-06-14: 15 mg via INTRAMUSCULAR
  Filled 2022-06-14: qty 1

## 2022-06-14 MED ORDER — OXYCODONE-ACETAMINOPHEN 5-325 MG PO TABS
1.0000 | ORAL_TABLET | Freq: Once | ORAL | Status: AC
  Administered 2022-06-14: 1 via ORAL
  Filled 2022-06-14: qty 1

## 2022-06-14 MED ORDER — SUCRALFATE 1 G PO TABS: 1.0000 g | ORAL_TABLET | Freq: Three times a day (TID) | ORAL | 0 refills | Status: DC

## 2022-06-14 MED ORDER — HYDROCODONE-ACETAMINOPHEN 5-325 MG PO TABS: 1.0000 | ORAL_TABLET | ORAL | 0 refills | Status: DC | PRN

## 2022-06-14 MED ORDER — IOHEXOL 300 MG/ML  SOLN
100.0000 mL | Freq: Once | INTRAMUSCULAR | Status: AC | PRN
  Administered 2022-06-14: 100 mL via INTRAVENOUS

## 2022-06-14 MED ORDER — AMOXICILLIN-POT CLAVULANATE 875-125 MG PO TABS: 1.0000 | ORAL_TABLET | Freq: Two times a day (BID) | ORAL | 0 refills | Status: DC

## 2022-06-14 NOTE — ED Triage Notes (Signed)
Pt states that she has been having RLQ abd pain since yesterday, denies n/v, some diarrhea, hx of diverticulitis

## 2022-06-14 NOTE — Discharge Instructions (Addendum)
It was a pleasure caring for you today in the emergency department. ° °Please return to the emergency department for any worsening or worrisome symptoms. ° ° °

## 2022-06-14 NOTE — ED Provider Notes (Signed)
[facility] EMERGENCY DEPARTMENT Provider Note   CSN: [id number] Arrival date & time: 06/13/22  2243     History  Chief Complaint  Patient presents with   Abdominal Pain    Lisa [name] is a 61 y.o. female.  Patient as above with significant medical history as below, including direct reticulitis, hypertension, OSA on CPAP, type II DM who presents to the ED with complaint of dental pain.  Patient reports onset of symptoms yesterday morning, initially suprapubic then encompasses lower quadrants of her abdomen.  No nausea or vomiting.  Pain described as sharp, stabbing, cramping.  No change in bowel or bladder function.  No BRBPR or melena.  No change to urination, she has chronic hematuria.  Patient reports symptoms feel similar to prior episodes of diverticulitis.  No fevers.  No diet changes      Past Medical History:  Diagnosis Date   Allergic rhinitis    Arthritis    Asthma    Breast nodule    right breast, to see dr Helane Rima 06-20-2012 for    Chest pain, non-cardiac    Chronic headaches    Cigarette smoker    Complication of anesthesia 05-17-2012   trouble breathing after colonscopy, needed nebulizer   COPD (chronic obstructive pulmonary disease) ([place])    Diastolic congestive heart failure ([place]) 04/17/2019   Diverticulosis    GERD (gastroesophageal reflux disease)    Hepatic hemangioma    Hypertension    IBS (irritable bowel syndrome)    Nuclear stress test    Myoview 05/2019: EF 63, no ischemia. Low Risk   Obesity    Prosthetic eye globe    Pyloric erosion    Sleep apnea    CPAP setting varies from 4-10   Type 2 diabetes mellitus ([place]) 03/11/2017    Past Surgical History:  Procedure Laterality Date   ABDOMINAL AORTOGRAM W/LOWER EXTREMITY N/A 04/30/2017   Procedure: Abdominal Aortogram w/Lower Extremity;  Surgeon: Elam Dutch, MD;  Location: [facility];  Service: Cardiovascular;  Laterality: N/A;   CARDIAC CATHETERIZATION  12/28/2007    EF 75%. IT REVEALS NORMAL/SUPRANORMAL LEFT VENTRICULAR SYSTOLIC FUNCTION   CARDIOVASCULAR STRESS TEST  03/25/2007   EF 78%   CESAREAN SECTION  2003, 2009   x 2   CHOLECYSTECTOMY  1993   COLONOSCOPY  2014   ENDOMETRIAL ABLATION  09/2010   and D&C   HERNIA REPAIR  6629   umbilical   LAPAROSCOPY     PERIPHERAL VASCULAR BALLOON ANGIOPLASTY Left 04/30/2017   Procedure: Peripheral Vascular Balloon Angioplasty;  Surgeon: Elam Dutch, MD;  Location: [facility];  Service: Cardiovascular;  Laterality: Left;  SFA   RECONSTRUCTION OF EYELID Bilateral    TUBAL LIGATION     UMBILICAL HERNIA REPAIR  2004   US ECHOCARDIOGRAPHY  05/08/2008   EF 55-60%   VENTRAL HERNIA REPAIR  06/15/2012   Procedure: LAPAROSCOPIC VENTRAL HERNIA;  Surgeon: Edward Jolly, MD;  Location: [facility];  Service: General;  Laterality: N/A;  LAPAROSCOPIC REPAIR VENTRAL HERNIA   VIDEO BRONCHOSCOPY Bilateral 11/15/2017   Procedure: VIDEO BRONCHOSCOPY WITH FLUORO;  Surgeon: Collene Gobble, MD;  Location: [facility];  Service: Cardiopulmonary;  Laterality: Bilateral;     The history is provided by the patient. No language interpreter was used.  Abdominal Pain Associated symptoms: no chest pain, no cough, no dysuria, no fever, no nausea and no shortness of breath        Home  Medications Prior to Admission medications   Medication Sig Start Date End Date Taking? Authorizing Provider  amoxicillin-clavulanate (AUGMENTIN) 875-125 MG tablet Take 1 tablet by mouth every 12 (twelve) hours. 06/14/22  Yes Jeanell Sparrow, DO  HYDROcodone-acetaminophen (NORCO/VICODIN) 5-325 MG tablet Take 1 tablet by mouth every 4 (four) hours as needed. 06/14/22  Yes Wynona Dove A, DO  sucralfate (CARAFATE) 1 g tablet Take 1 tablet (1 g total) by mouth 4 (four) times daily -  with meals and at bedtime for 7 days. 06/14/22 06/21/22 Yes Wynona Dove A, DO  ASPIRIN 81 PO Take 81 mg by mouth daily.    [provider]  Bempedoic  Acid-Ezetimibe (NEXLIZET) 180-10 MG TABS Take 1 tablet by mouth daily. Please call [phone] to schedule an overdue appointment for future refills. Thank you. 3rd attempt. 05/15/22   Nahser, Wonda Cheng, MD  buPROPion (WELLBUTRIN XL) 150 MG 24 hr tablet Take 1 tablet (150 mg total) by mouth daily. 03/24/22   Marin Olp, MD  carvedilol (COREG) 25 MG tablet Take 1 tablet (25 mg total) by mouth 2 (two) times daily. 04/02/21   Nahser, Wonda Cheng, MD  dicyclomine (BENTYL) 10 MG capsule Take 1 capsule (10 mg total) by mouth 3 (three) times daily before meals. As needed 07/24/21   Marin Olp, MD  Dulaglutide (TRULICITY) 4.23 NT/6.1WE SOPN Inject 0.75 mg into the skin once a week. 06/10/22   Shamleffer, Melanie Crazier, MD  Evolocumab (REPATHA SURECLICK) 315 MG/ML SOAJ Inject 1 Pen into the skin every 14 (fourteen) days. Patient not taking: Reported on 06/10/2022 06/04/22   Nahser, Wonda Cheng, MD  ONE [facility] LANCETS MISC Use to check blood sugars twice a day 12/20/17   Waldon Merl, PA-C  pantoprazole (PROTONIX) 40 MG tablet TAKE 1 TABLET BY MOUTH 30 MINS BEFORE BREAKFAST DAILY. 12/07/21   Ladene Artist, MD  telmisartan (MICARDIS) 40 MG tablet Take 1 tablet (40 mg total) by mouth daily. 07/24/21   Marin Olp, MD      Allergies    Almond oil, Apple juice, Ceftin [cefuroxime], Cherry extract, Other, Guaifenesin & derivatives, Hctz [hydrochlorothiazide], Naproxen, and Lisinopril    Review of Systems   Review of Systems  Constitutional:  Negative for activity change and fever.  HENT:  Negative for facial swelling and trouble swallowing.   Eyes:  Negative for discharge and redness.  Respiratory:  Negative for cough and shortness of breath.   Cardiovascular:  Negative for chest pain and palpitations.  Gastrointestinal:  Positive for abdominal pain. Negative for nausea.  Genitourinary:  Negative for dysuria and flank pain.  Musculoskeletal:  Negative for back pain and gait problem.  Skin:  Negative  for pallor and rash.  Neurological:  Negative for syncope and headaches.    Physical Exam Updated Vital Signs BP (!) 142/76   Pulse 81   Temp 99 F (37.2 C) (Oral)   Resp 18   SpO2 98%  Physical Exam Vitals and nursing note reviewed.  Constitutional:      General: She is not in acute distress.    Appearance: Normal appearance.  HENT:     Head: Normocephalic and atraumatic.     Right Ear: External ear normal.     Left Ear: External ear normal.     Nose: Nose normal.     Mouth/Throat:     Mouth: Mucous membranes are moist.  Eyes:     General: No scleral icterus.  Right eye: No discharge.        Left eye: No discharge.  Cardiovascular:     Rate and Rhythm: Normal rate and regular rhythm.     Pulses: Normal pulses.     Heart sounds: Normal heart sounds.  Pulmonary:     Effort: Pulmonary effort is normal. No respiratory distress.     Breath sounds: Normal breath sounds. No decreased breath sounds.  Abdominal:     General: Abdomen is flat.     Tenderness: There is abdominal tenderness in the right lower quadrant, suprapubic area and left lower quadrant. There is no guarding or rebound.  Musculoskeletal:        General: Normal range of motion.     Cervical back: Normal range of motion.     Right lower leg: No edema.     Left lower leg: No edema.  Skin:    General: Skin is warm and dry.     Capillary Refill: Capillary refill takes less than 2 seconds.  Neurological:     Mental Status: She is alert.  Psychiatric:        Mood and Affect: Mood normal.        Behavior: Behavior normal.     ED Results / Procedures / Treatments   Labs (all labs ordered are listed, but only abnormal results are displayed) Labs Reviewed  CBC WITH DIFFERENTIAL/PLATELET - Abnormal; Notable for the following components:      Result Value   WBC 11.0 (*)    RBC 5.18 (*)    Hemoglobin 15.7 (*)    HCT 46.2 (*)    Neutro Abs 8.5 (*)    All other components within normal limits   COMPREHENSIVE METABOLIC PANEL - Abnormal; Notable for the following components:   Glucose, Bld 232 (*)    Creatinine, Ser 1.12 (*)    AST 14 (*)    GFR, Estimated 57 (*)    All other components within normal limits  URINALYSIS, ROUTINE W REFLEX MICROSCOPIC - Abnormal; Notable for the following components:   APPearance HAZY (*)    Hgb urine dipstick MODERATE (*)    Bacteria, UA RARE (*)    All other components within normal limits    EKG None  Radiology CT ABDOMEN PELVIS W CONTRAST  Result Date: 06/14/2022 CLINICAL DATA:  61 year old female with history of right lower quadrant abdominal pain. History of diverticulitis. EXAM: CT ABDOMEN AND PELVIS WITH CONTRAST TECHNIQUE: Multidetector CT imaging of the abdomen and pelvis was performed using the standard protocol following bolus administration of intravenous contrast. RADIATION DOSE REDUCTION: This exam was performed according to the departmental dose-optimization program which includes automated exposure control, adjustment of the mA and/or kV according to patient size and/or use of iterative reconstruction technique. CONTRAST:  122m OMNIPAQUE IOHEXOL 300 MG/ML  SOLN COMPARISON:  CT the abdomen and pelvis 01/28/2022. FINDINGS: Lower chest: Mosaic attenuation in the lung bases suggesting extensive air trapping from small airways disease. Hepatobiliary: Liver is enlarged measuring 20.8 cm in craniocaudal span. Between segments 7 and 8 in the superior aspect of the right lobe of the liver (axial image 7 of series 3) there is a 2.4 x 2.0 cm centrally hypovascular lesion which demonstrates some peripheral nodular enhancement, similar to prior examinations, compatible with a cavernous hemangioma. No other suspicious hepatic lesions are noted. No intra or extrahepatic biliary ductal dilatation. Status post cholecystectomy. Pancreas: No pancreatic mass. No pancreatic ductal dilatation. No pancreatic or peripancreatic fluid collections or inflammatory  changes. Spleen: Unremarkable. Adrenals/Urinary Tract: In the anterior aspect of the lower pole of the left kidney (axial image 32 of series 3) there is a 1.6 cm low-attenuation nonenhancing lesion compatible with a small simple cyst. Subcentimeter low-attenuation lesions are also noted in the right kidney, too small to characterize, but favored to represent tiny cysts (no imaging follow-up for any of these lesions is recommended). No aggressive appearing renal lesions. No hydroureteronephrosis. Urinary bladder is unremarkable in appearance. Bilateral adrenal glands are normal in appearance. Stomach/Bowel: The appearance of the stomach is normal. There is no pathologic dilatation of small bowel or colon. Numerous colonic diverticulae are noted, particularly in the descending colon and sigmoid colon. In the mid to distal sigmoid colon there are extensive surrounding inflammatory changes adjacent to a large diverticulum (axial image 57 of series 3), indicative of acute diverticulitis. No definite diverticular abscess or signs of frank perforation or confidently identified on this examination. Normal appendix. Vascular/Lymphatic: Aortic atherosclerosis, without evidence of aneurysm or dissection in the abdominal or pelvic vasculature. No lymphadenopathy noted in the abdomen or pelvis. Reproductive: Uterus and ovaries are unremarkable in appearance. Other: No significant volume of ascites. No pneumoperitoneum. Status post mesh repair for ventral hernia. Musculoskeletal: There are no aggressive appearing lytic or blastic lesions noted in the visualized portions of the skeleton. IMPRESSION: 1. Findings are compatible with acute diverticulitis of the mid to distal sigmoid colon, as above. No diverticular abscess or signs of frank perforation are noted at this time. 2. Hepatomegaly. 3. Stable cavernous hemangioma (benign) in the right lobe of the liver, as above. 4. Aortic atherosclerosis. 5. Additional incidental findings,  as above. Electronically Signed   By: Vinnie Langton M.D.   On: 06/14/2022 06:28    Procedures Procedures    Medications Ordered in ED Medications  ketorolac (TORADOL) 30 MG/ML injection 15 mg (15 mg Intramuscular Given 06/14/22 0025)  oxyCODONE-acetaminophen (PERCOCET/ROXICET) 5-325 MG per tablet 1 tablet (1 tablet Oral Given 06/14/22 0025)  iohexol (OMNIPAQUE) 300 MG/ML solution 100 mL (100 mLs Intravenous Contrast Given 06/14/22 0612)  oxyCODONE-acetaminophen (PERCOCET/ROXICET) 5-325 MG per tablet 1 tablet (1 tablet Oral Given 06/14/22 1113)    ED Course/ Medical Decision Making/ A&P                           Medical Decision Making Risk Prescription drug management.    CC: Abdominal pain  This patient presents to the Emergency Department for the above complaint. This involves an extensive number of treatment options and is a complaint that carries with it a high risk of complications and morbidity. Vital signs were reviewed. Serious etiologies considered.  Differential diagnosis includes but is not exclusive to ectopic pregnancy, ovarian cyst, ovarian torsion, acute appendicitis, urinary tract infection, endometriosis, bowel obstruction, hernia, colitis, renal colic, gastroenteritis, volvulus etc.  Record review:  Previous records obtained and reviewed prior office visits, prior labs and imaging  Additional history obtained from N/A  Medical and surgical history as noted above.   Work up as above, notable for:  Labs & imaging results that were available during my care of the patient were visualized by me and considered in my medical decision making.  Physical exam as above.   I ordered imaging studies which included CT abdomen pelvis IV contrast. I visualized the imaging, interpreted images, and I agree with radiologist interpretation.  Sigmoid diverticulitis without abscess or perforation  Cardiac monitoring reviewed and interpreted personally which shows  NSR  Management: Analgesics  ED Course:     Reassessment:  Symptoms improved.  Patient tolerant p.o. intake without difficulty.  Symptoms likely secondary to uncomplicated diverticulitis.  Tolerating p.o. Will give p.o. antibiotics.  She has received colonoscopy in the past ~ 2 years ago. Tolerating PO, abd is soft, not peritoneal.   Admission was considered.   Give augmentin/analgesics, encourage diet changes, f/u with pcp   Uncomplicated diverticulitis is likely etiology for abdominal pain  Updated on incidental findings from imaging including hemangioma of liver and hepatic steatosis.  Advised to follow w/ PCP regarding this.  The patient improved significantly and was discharged in stable condition. Detailed discussions were had with the patient regarding current findings, and need for close f/u with PCP or on call doctor. The patient has been instructed to return immediately if the symptoms worsen in any way for re-evaluation. Patient verbalized understanding and is in agreement with current care plan. All questions answered prior to discharge.          Social determinants of health include -  Counseled patient for approximately 3 minutes regarding smoking cessation. Discussed risks of smoking and how they applied and affected their visit here today. Patient not ready to quit at this time, however will follow up with their primary doctor when they are.   CPT code: [phone]: intermediate counseling for smoking cessation   Social History   Socioeconomic History   Marital status: Married    Spouse name: Science writer   Number of children: 2   Years of education: 14+   Highest education level: Not on file  Occupational History   Occupation: Social Service  Tobacco Use   Smoking status: Every Day    Packs/day: 1.50    Years: 38.00    Total pack years: 57.00    Types: Cigarettes   Smokeless tobacco: Never   Tobacco comments:    10 cigarettes daily [facility] 09/10/2020  Vaping  Use   Vaping Use: Never used  Substance and Sexual Activity   Alcohol use: No    Alcohol/week: 0.0 standard drinks of alcohol   Drug use: No   Sexual activity: Yes    Comment: intercourse age 61, less than 5 sexual partners,des neg  Other Topics Concern   Not on file  Social History Narrative   Married 1997. 2 sons 37 and 70 years old in 10/18. Wants to see children grow up.       Customer Service/accounts payable.    Went to school for medical assisting- has had a hard time finding work      Hobbies: [employer], enjoying [place], getting out of home, wants to take [employer]   Social Determinants of Radio broadcast assistant Strain: Not on [employer] Insecurity: Not on file  Transportation Needs: Not on file  Physical Activity: Not on file  Stress: Not on file  Social Connections: Not on file  Intimate Partner Violence: Not on file      This chart was dictated using Armed forces training and education officer.  Despite best efforts to proofread,  errors can occur which can change the documentation meaning.         Final Clinical Impression(s) / ED Diagnoses Final diagnoses:  Diverticulitis  Hemangioma of intra-abdominal structure  Hepatic steatosis    Rx / DC Orders ED Discharge Orders          Ordered    amoxicillin-clavulanate (AUGMENTIN) 875-125 MG tablet  Every 12 hours  06/14/22 1429    HYDROcodone-acetaminophen (NORCO/VICODIN) 5-325 MG tablet  Every 4 hours PRN        06/14/22 1429    sucralfate (CARAFATE) 1 g tablet  3 times daily with meals & bedtime        06/14/22 1430              Jeanell Sparrow, DO 06/14/22 1434

## 2022-06-14 NOTE — ED Provider Triage Note (Signed)
Emergency Medicine Provider Triage Evaluation Note  Lisa [name] , a 60 y.o. female  was evaluated in triage.  Pt complains of abdominal pain since yesterday. Pain was initially constant and aching, but has now become more severe. Severity waxes/wanes in "waves", per patient. No relief with OTC analgesics. Had a looser, watery BM today. Denies fevers, vomiting, dysuria, hematuria, melena, hematochezia. Hx of cholecystectomy, ventral hernia repair.  Review of Systems  Positive: Abdominal pain Negative: As above  Physical Exam  BP (!) 142/107   Pulse 94   Resp 16   SpO2 94%  Gen:   Awake, intermittently uncomfortable due to pain Resp:  Normal effort  MSK:   Moves extremities without difficulty  Other:  Abdominal exam limited 2/2 habitus. No guarding or peritoneal signs.  Medical Decision Making  Medically screening exam initiated at 12:16 AM.  Appropriate orders placed.  Lisa [name] was informed that the remainder of the evaluation will be completed by another provider, this initial triage assessment does not replace that evaluation, and the importance of remaining in the ED until their evaluation is complete.  Abdominal pain - labs and CT ordered. Colicky nature of pain suggestive of kidney stone; however will continue with contrasted study given prior abdominal SHx.    Antonietta Breach, PA-C 06/14/22 0020

## 2022-06-14 NOTE — ED Notes (Signed)
Patient given bag lunch with water for PO Challenge.

## 2022-06-15 ENCOUNTER — Ambulatory Visit (INDEPENDENT_AMBULATORY_CARE_PROVIDER_SITE_OTHER): Payer: Managed Care, Other (non HMO) | Admitting: Family Medicine

## 2022-06-15 ENCOUNTER — Encounter: Payer: Self-pay | Admitting: Family Medicine

## 2022-06-15 VITALS — BP 120/64 | HR 75 | Temp 98.0°F | Ht 65.5 in | Wt 256.6 lb

## 2022-06-15 DIAGNOSIS — E785 Hyperlipidemia, unspecified: Secondary | ICD-10-CM

## 2022-06-15 DIAGNOSIS — K5792 Diverticulitis of intestine, part unspecified, without perforation or abscess without bleeding: Secondary | ICD-10-CM | POA: Diagnosis not present

## 2022-06-15 DIAGNOSIS — E1165 Type 2 diabetes mellitus with hyperglycemia: Secondary | ICD-10-CM

## 2022-06-15 DIAGNOSIS — E1169 Type 2 diabetes mellitus with other specified complication: Secondary | ICD-10-CM

## 2022-06-15 NOTE — Patient Instructions (Addendum)
Get diabetic eye exam scheduled.  Finish antibiotics  We are considering surgical consult if more episodes in next 3-6 months  Hope you feel better soon  Recommended follow up: Return for next already scheduled visit or sooner if needed.

## 2022-06-15 NOTE — Progress Notes (Signed)
Phone [phone] In person visit   Subjective:   Lisa [name] is a 61 y.o. year old very pleasant female patient who presents for/with See problem oriented charting Chief Complaint  Patient presents with   Follow-up    Pt is here to f/u from ER visit, pt states she still has not had a bowel movement in 2 days.   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) 01/18/2015    Priority: High   Anxiety 03/24/2022    Priority: Medium    Aortic atherosclerosis ([place]) 09/10/2021    Priority: Medium    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    Priority: Medium    CKD (chronic kidney disease), stage III ([place]) 09/18/2020    Priority: Medium    Fatty liver 11/06/2019    Priority: Medium    Hyperlipidemia 04/12/2018    Priority: Medium    Hyperlipidemia associated with type 2 diabetes mellitus ([place]) 09/06/2017    Priority: Medium    Breast mass 08/03/2017    Priority: Medium    GERD (gastroesophageal reflux disease) 02/14/2016    Priority: Medium    DOE (dyspnea on exertion) 03/08/2014    Priority: Medium    Migraine without aura 07/26/2013    Priority: Medium    Sleep apnea 02/11/2012    Priority: Medium    Chest discomfort 10/12/2011    Priority: Medium    Essential hypertension 10/12/2011    Priority: Medium    Chronic rhinitis 06/26/2019    Priority: Low    Trigger finger, right middle finger 05/04/2019    Priority: Low   Bilateral leg pain 04/14/2018    Priority: Low   S/P bronchoscopy with biopsy     Priority: Low   Chronic hip pain 07/29/2017    Priority: Low   Left knee pain 05/15/2016    Priority: Low   Flank pain 04/21/2016    Priority: Low   Abdominal pain 02/28/2016    Priority: Low   Pulmonary nodule, right 04/11/2015    Priority: Low   Mediastinal lymphadenopathy 01/18/2015    Priority: Low   Multinodular goiter 06/10/2022   Type 2 diabetes mellitus with stage 3a chronic kidney disease, without long-term current use of insulin ([place]) 06/10/2022    Medications- reviewed and updated Current Outpatient Medications  Medication Sig Dispense Refill   amoxicillin-clavulanate (AUGMENTIN) 875-125 MG tablet Take 1 tablet by mouth every 12 (twelve) hours. 14 tablet 0   ASPIRIN 81 PO Take 81 mg by mouth daily.     Bempedoic Acid-Ezetimibe (NEXLIZET) 180-10 MG TABS Take 1 tablet by mouth daily. Please call [phone] to schedule an overdue appointment for future refills. Thank you. 3rd attempt. 15 tablet 0   buPROPion (WELLBUTRIN XL) 150 MG 24 hr tablet Take 1 tablet (150 mg total) by mouth daily. 90 tablet 3   carvedilol (COREG) 25  MG tablet Take 1 tablet (25 mg total) by mouth 2 (two) times daily. 180 tablet 3   dicyclomine (BENTYL) 10 MG capsule Take 1 capsule (10 mg total) by mouth 3 (three) times daily before meals. As needed 270 capsule 3   Dulaglutide (TRULICITY) 3.66 YQ/0.3KV SOPN Inject 0.75 mg into the skin once a week. 2 mL 3   Evolocumab (REPATHA SURECLICK) 425 MG/ML SOAJ Inject 1 Pen into the skin every 14 (fourteen) days. 6 mL 3   HYDROcodone-acetaminophen (NORCO/VICODIN) 5-325 MG tablet Take 1 tablet by mouth every 4 (four) hours as needed. 8 tablet 0   ONE TOUCH LANCETS MISC Use to check blood sugars twice a day 200 each 0   pantoprazole (PROTONIX) 40 MG tablet TAKE 1 TABLET BY MOUTH 30 MINS BEFORE BREAKFAST DAILY. 90  tablet 1   sucralfate (CARAFATE) 1 g tablet Take 1 tablet (1 g total) by mouth 4 (four) times daily -  with meals and at bedtime for 7 days. 28 tablet 0   telmisartan (MICARDIS) 40 MG tablet Take 1 tablet (40 mg total) by mouth daily. 90 tablet 3   No current facility-administered medications for this visit.     Objective:  BP 120/64   Pulse 75   Temp 98 F (36.7 C)   Ht 5' 5.5" (1.664 m)   Wt 256 lb 9.6 oz (116.4 kg)   SpO2 95%   BMI 42.05 kg/m  Gen: NAD, resting comfortably CV: RRR no murmurs rubs or gallops Lungs: CTAB no crackles, wheeze, rhonchi Abdomen: Diffusely tender but no rebounding or guarding.  Pain most prominent in right lower quadrant even though has sigmoid diverticulitis  Ext: Minimal edema Skin: warm, dry    Assessment and Plan   #ED follow-up for diverticulitis S: Patient presented to the [facility] department 06/13/2022 with suprapubic pain that radiated to both left and right lower abdomen.  In the emergency department note was described as sharp, stabbing, cramping.  Denied recent change in bowel or bladder function.  No blood in the stool or melena.  No fevers noted.  Vital signs were stable although blood pressure was mildly elevated.  Did have white count elevation of 11,000.  She was noted to have sigmoid diverticulitis without abscess or perforation on CT scan-since she was tolerating oral medications she was given by mouth antibiotics Augmentin and encouraged to follow-up with PCP -Patient with sigmoid diverticulitis in March of this year as well has October 2022-on the sigmoid  Fatty liver was noted as well as liver lesion consistent with hemangioma previously noted.  Also noted to have simple cyst on the kidney.  Aortic atherosclerosis again noted  Today she reports some improvement in symptoms  pain peak down from10/10 to 6/10. Probiotics cause spasms. Pain comes in waves but overall improved. No BM in 2 days.  A/P: Acute uncomplicated  diverticulitis without blood or fever.  Did have elevated white count-she prefers not to repeat labs at this time -colonoscopy up to date 06/12/20 with 3 year repeat planned.  -finish antibiotics at this point -discussed quitting smoking and increasing exercise and improving diet could help reduce long term risks   -in between documented diverticulitis episodes (oct 22, march 23, July 23) is still having episodes of pain in between- discussed possible surgical referral to consider partial colectomy potentially - she would like to monitor over next 3-6 months and if not improving we will refer  # Diabetes-follows with Dr. Kelton Pillar S: Medication: Trulicity 9.56 mg CBGs-have been  elevated Exercise and diet-exercise has been lacking-several dietary indiscretions noted as well Lab Results  Component Value Date   HGBA1C 8.6 (H) 04/24/2022   HGBA1C 8.2 (H) 01/15/2022   HGBA1C 9.5 (H) 09/08/2021   A/P: Poor control-multiple side effects on multiple medications in the past and patient hesitant to start new medications.  Thankful she at least agreed to take Trulicity-we discussed importance of considering insulin if she fails to improve and she agrees to do so-reports desire to change diet and exercise but has been challenging with her schedule-I reinforced many of the long-term concerns that Dr. Kelton Pillar expressed.  Counseling on diet/exercise provided   #hyperlipidemia S: Medication: Off Repatha recently-states pharmacy does not have available.  Is prescribed nexlizet Lab Results  Component Value Date   CHOL 147 09/08/2021   HDL 23.50 (L) 09/08/2021   LDLCALC 57 06/19/2020   LDLDIRECT 144.0 03/24/2022   TRIG 392.0 (H) 09/08/2021   CHOLHDL 6 09/08/2021   A/P: Suspect poor control of both medications-encouraged her to follow-up with lipid clinic and cardiology  #Tobacco abuse-strongly encouraged cessation-this time in relation to risk of diverticulitis  Recommended follow up: Return for next  already scheduled visit or sooner if needed. Future Appointments  Date Time Provider [place]  10/26/2022  2:20 PM Marin Olp, MD [facility]  11/06/2022  2:20 PM Shamleffer, Melanie Crazier, MD [facility] None    Lab/Order associations:   ICD-10-CM   1. Diverticulitis  K57.92     2. Type 2 diabetes mellitus with hyperglycemia, without long-term current use of insulin (HCC)  E11.65     3. Hyperlipidemia associated with type 2 diabetes mellitus (HCC)  E11.69    E78.5       Time Spent: 43 minutes of total time (2:35 PM- 3:18 PM) was spent on the date of the encounter performing the following actions: chart review prior to seeing the patient including emergency department notes and labs, obtaining history, performing a medically necessary exam, counseling on the treatment plan as well as discussions about importance of diet/exercise/A1c control and long-term risks if not controlled, placing orders, and documenting in our EHR.    Return precautions advised.  Garret Reddish, MD

## 2022-06-16 ENCOUNTER — Other Ambulatory Visit: Payer: Self-pay | Admitting: Cardiovascular Disease

## 2022-06-30 ENCOUNTER — Other Ambulatory Visit: Payer: Self-pay | Admitting: Cardiovascular Disease

## 2022-07-02 ENCOUNTER — Ambulatory Visit
Admission: EM | Admit: 2022-07-02 | Discharge: 2022-07-02 | Disposition: A | Discharge status: Home / Self Care | Payer: Managed Care, Other (non HMO) | Attending: Internal Medicine | Admitting: Internal Medicine

## 2022-07-02 ENCOUNTER — Other Ambulatory Visit: Payer: Self-pay | Admitting: Emergency Medicine

## 2022-07-02 ENCOUNTER — Ambulatory Visit: Payer: Managed Care, Other (non HMO)

## 2022-07-02 DIAGNOSIS — M79672 Pain in left foot: Secondary | ICD-10-CM

## 2022-07-02 MED ORDER — PREDNISONE 10 MG PO TABS: 10.0000 mg | ORAL_TABLET | Freq: Every day | ORAL | 0 refills | Status: AC

## 2022-07-02 NOTE — ED Provider Notes (Signed)
Lisa [name]    CSN: [id number] Arrival date & time: 07/02/22  1848      History   Chief Complaint Chief Complaint  Patient presents with   Leg Pain    HPI Lisa WIGGLESWORTH is a 61 y.o. female.   Patient presents with left foot swelling and pain that started about 4 days ago.  Patient reports that she had a flareup of gout in her left great toe that resolved while eating tart cherries prior to this current foot pain starting.  Patient reports history of persistent gout that occurs about every year.  After great toe pain resolved, she developed pain in the dorsal surface of the foot.  She states that she has never had gout in this area before so she is not sure if gout was attributed to pain.  Denies any obvious injury to the area.  Denies numbness or tingling.  She is able to bear weight but with pain.  She has taken Tylenol for pain with minimal improvement.   Leg Pain   Past Medical History:  Diagnosis Date   Allergic rhinitis    Arthritis    Asthma    Breast nodule    right breast, to see dr Helane Rima 06-20-2012 for    Chest pain, non-cardiac    Chronic headaches    Cigarette smoker    Complication of anesthesia 05-17-2012   trouble breathing after colonscopy, needed nebulizer   COPD (chronic obstructive pulmonary disease) ([place])    Diastolic congestive heart failure ([place]) 04/17/2019   Diverticulosis    GERD (gastroesophageal reflux disease)    Hepatic hemangioma    Hypertension    IBS (irritable bowel syndrome)    Nuclear stress test    Myoview 05/2019: EF 63, no ischemia. Low Risk   Obesity    Prosthetic eye globe    Pyloric erosion    Sleep apnea    CPAP setting varies from 4-10   Type 2 diabetes mellitus ([place]) 03/11/2017    Patient Active Problem List   Diagnosis Date Noted   Multinodular goiter 06/10/2022   Type 2 diabetes mellitus with stage 3a chronic kidney disease, without long-term current use of insulin ([place]) 06/10/2022   Anxiety 03/24/2022    Type 2 diabetes mellitus with hyperglycemia, without long-term current use of insulin ([place]) 10/03/2021   Aortic atherosclerosis ([place]) 09/10/2021   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   CKD (chronic kidney disease), stage III ([place]) 09/18/2020   Fatty liver 11/06/2019   Chronic rhinitis 06/26/2019   Trigger finger, right middle finger 05/04/2019   Coronary artery calcification 11/94/1740   Diastolic congestive heart failure ([place]) 04/17/2019   Bilateral leg pain 04/14/2018   Hyperlipidemia 04/12/2018   S/P bronchoscopy with biopsy    Hyperlipidemia associated with type 2 diabetes mellitus ([place]) 09/06/2017   Breast mass 08/03/2017   Chronic hip pain 07/29/2017   PAD (peripheral artery disease) ([place]) 01/19/2017   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 ([place]) 01/14/2017   Left knee pain 05/15/2016   Flank pain 04/21/2016   Abdominal pain 02/28/2016   GERD (gastroesophageal reflux disease) 02/14/2016   Morbid obesity ([place]) 02/14/2016   Tobacco use disorder 08/12/2015   Pulmonary nodule, right 04/11/2015   COPD (chronic obstructive pulmonary disease) ([place]) 01/18/2015   Mediastinal lymphadenopathy 01/18/2015   DOE (dyspnea on exertion) 03/08/2014   Migraine without aura 07/26/2013   Sleep apnea 02/11/2012   Chest discomfort 10/12/2011   Essential hypertension 10/12/2011    Past  Surgical History:  Procedure Laterality Date   ABDOMINAL AORTOGRAM W/LOWER EXTREMITY N/A 04/30/2017   Procedure: Abdominal Aortogram w/Lower Extremity;  Surgeon: Elam Dutch, MD;  Location: [facility];  Service: Cardiovascular;  Laterality: N/A;   CARDIAC CATHETERIZATION  12/28/2007   EF 75%. IT REVEALS NORMAL/SUPRANORMAL LEFT VENTRICULAR SYSTOLIC FUNCTION   CARDIOVASCULAR STRESS TEST  03/25/2007   EF 78%   CESAREAN SECTION  2003, 2009   x 2   CHOLECYSTECTOMY  1993   COLONOSCOPY  2014   ENDOMETRIAL ABLATION  09/2010   and D&C   HERNIA REPAIR  6226   umbilical    LAPAROSCOPY     PERIPHERAL VASCULAR BALLOON ANGIOPLASTY Left 04/30/2017   Procedure: Peripheral Vascular Balloon Angioplasty;  Surgeon: Elam Dutch, MD;  Location: [facility];  Service: Cardiovascular;  Laterality: Left;  SFA   RECONSTRUCTION OF EYELID Bilateral    TUBAL LIGATION     UMBILICAL HERNIA REPAIR  2004   US ECHOCARDIOGRAPHY  05/08/2008   EF 55-60%   VENTRAL HERNIA REPAIR  06/15/2012   Procedure: LAPAROSCOPIC VENTRAL HERNIA;  Surgeon: Edward Jolly, MD;  Location: [facility];  Service: General;  Laterality: N/A;  LAPAROSCOPIC REPAIR VENTRAL HERNIA   VIDEO BRONCHOSCOPY Bilateral 11/15/2017   Procedure: VIDEO BRONCHOSCOPY WITH FLUORO;  Surgeon: Collene Gobble, MD;  Location: [facility];  Service: Cardiopulmonary;  Laterality: Bilateral;    OB History     Gravida  4   Para  2   Term  2   Preterm      AB  2   Living  2      SAB      IAB      Ectopic  1   Multiple      Live Births               Home Medications    Prior to Admission medications   Medication Sig Start Date End Date Taking? Authorizing Provider  predniSONE (DELTASONE) 10 MG tablet Take 1 tablet (10 mg total) by mouth daily for 5 days. 07/02/22 07/07/22 Yes Fatma Rutten, Michele Rockers, FNP  amoxicillin-clavulanate (AUGMENTIN) 875-125 MG tablet Take 1 tablet by mouth every 12 (twelve) hours. 06/14/22   Wynona Dove A, DO  ASPIRIN 81 PO Take 81 mg by mouth daily.    [provider]  Bempedoic Acid-Ezetimibe (NEXLIZET) 180-10 MG TABS Take 1 tablet by mouth daily. Please call [phone] to schedule an overdue appointment for future refills. Thank you. 3rd attempt. 05/15/22   Nahser, Wonda Cheng, MD  buPROPion (WELLBUTRIN XL) 150 MG 24 hr tablet Take 1 tablet (150 mg total) by mouth daily. 03/24/22   Marin Olp, MD  carvedilol (COREG) 25 MG tablet TAKE 1 TABLET BY MOUTH TWICE A DAY 06/16/22   Nahser, Wonda Cheng, MD  dicyclomine (BENTYL) 10 MG capsule Take 1 capsule (10 mg total) by mouth 3  (three) times daily before meals. As needed 07/24/21   Marin Olp, MD  Dulaglutide (TRULICITY) 3.89 HT/3.4KA SOPN Inject 0.75 mg into the skin once a week. 06/10/22   Shamleffer, Melanie Crazier, MD  Evolocumab (REPATHA SURECLICK) 768 MG/ML SOAJ Inject 1 Pen into the skin every 14 (fourteen) days. 06/04/22   Nahser, Wonda Cheng, MD  HYDROcodone-acetaminophen (NORCO/VICODIN) 5-325 MG tablet Take 1 tablet by mouth every 4 (four) hours as needed. 06/14/22   Jeanell Sparrow, DO  ONE TOUCH LANCETS MISC Use to check blood sugars twice a day  12/20/17   Waldon Merl, PA-C  pantoprazole (PROTONIX) 40 MG tablet TAKE 1 TABLET BY MOUTH 30 MINS BEFORE BREAKFAST DAILY. 12/07/21   Ladene Artist, MD  sucralfate (CARAFATE) 1 g tablet Take 1 tablet (1 g total) by mouth 4 (four) times daily -  with meals and at bedtime for 7 days. 06/14/22 06/21/22  Jeanell Sparrow, DO  telmisartan (MICARDIS) 40 MG tablet Take 1 tablet (40 mg total) by mouth daily. 07/24/21   Marin Olp, MD    Family History Family History  Problem Relation Age of Onset   Hypertension Mother    Lung cancer Mother        64   Heart disease Father        pacemaker or defibrillator   Breast cancer Maternal Aunt 78   Heart attack Paternal Uncle    Diabetes Maternal Grandmother    Colon cancer Neg Hx    Esophageal cancer Neg Hx    Pancreatic cancer Neg Hx     Social History Social History   Tobacco Use   Smoking status: Every Day    Packs/day: 1.50    Years: 38.00    Total pack years: 57.00    Types: Cigarettes   Smokeless tobacco: Never   Tobacco comments:    10 cigarettes daily ARJ 09/10/2020  Vaping Use   Vaping Use: Never used  Substance Use Topics   Alcohol use: No    Alcohol/week: 0.0 standard drinks of alcohol   Drug use: No     Allergies   Almond oil, Apple juice, Ceftin [cefuroxime], Cherry extract, Other, Guaifenesin & derivatives, Hctz [hydrochlorothiazide], Naproxen, and Lisinopril   Review of  Systems Review of Systems Per HPI  Physical Exam Triage Vital Signs ED Triage Vitals  Enc Vitals Group     BP 07/02/22 1900 (!) 159/86     Pulse Rate 07/02/22 1900 72     Resp 07/02/22 1900 20     Temp 07/02/22 1900 (!) 97.4 F (36.3 C)     Temp src --      SpO2 07/02/22 1900 94 %     Weight --      Height --      Head Circumference --      Peak Flow --      Pain Score 07/02/22 1859 8     Pain Loc --      Pain Edu? --      Excl. in [place]? --    No data found.  Updated Vital Signs BP (!) 159/86   Pulse 72   Temp (!) 97.4 F (36.3 C)   Resp 20   SpO2 94%   Visual Acuity Right Eye Distance:   Left Eye Distance:   Bilateral Distance:    Right Eye Near:   Left Eye Near:    Bilateral Near:     Physical Exam Constitutional:      General: She is not in acute distress.    Appearance: Normal appearance. She is not toxic-appearing or diaphoretic.  HENT:     Head: Normocephalic and atraumatic.  Eyes:     Extraocular Movements: Extraocular movements intact.     Conjunctiva/sclera: Conjunctivae normal.  Pulmonary:     Effort: Pulmonary effort is normal.  Feet:     Comments: Diffuse dorsal foot swelling across dorsum of foot.  No obvious discoloration, warmth, lacerations, abrasions noted.  Tenderness to palpation to left great toe as well as medial portion of  foot. Neurovascular intact.  Neurological:     General: No focal deficit present.     Mental Status: She is alert and oriented to person, place, and time. Mental status is at baseline.  Psychiatric:        Mood and Affect: Mood normal.        Behavior: Behavior normal.        Thought Content: Thought content normal.        Judgment: Judgment normal.      UC Treatments / Results  Labs (all labs ordered are listed, but only abnormal results are displayed) Labs Reviewed - No data to display  EKG   Radiology DG Foot Complete Left  Result Date: 07/02/2022 CLINICAL DATA:  Left foot pain and swelling, no  known injury, initial encounter EXAM: LEFT FOOT - COMPLETE 3+ VIEW COMPARISON:  07/28/2021 FINDINGS: Generalized soft tissue swelling is noted about the metatarsals. No acute fracture or dislocation is seen. Tarsal degenerative changes as well as calcaneal spurring is noted. IMPRESSION: Soft tissue swelling in the distal foot. Degenerative changes without acute bony abnormality. Electronically Signed   By: Inez Catalina M.D.   On: 07/02/2022 19:22    Procedures Procedures (including critical [name] time)  Medications Ordered in UC Medications - No data to display  Initial Impression / Assessment and Plan / UC Course  I have reviewed the triage vital signs and the nursing notes.  Pertinent labs & imaging results that were available during my [name] of the patient were reviewed by me and considered in my medical decision making (see chart for details).     Left foot x-ray was negative for any acute bony abnormality.  X-ray does show degenerative changes.  Differential diagnoses include gout versus arthritis causing pain.  Patient's physical exam is not super convincing for gout but it is a possibility given recent gout flareup. Limited options on pain management given patient has CKD so NSAIDs are contraindicated.  Patient takes Coreg as well so colchicine will interact with Coreg and is also not advised with CKD.  Patient reports that she has taken prednisone before multiple times.  She states that her blood sugar does go up sometimes but she does have a glucose monitor to monitor.  Given limited options on pain management and Tylenol not helping with pain, will treat with very low-dose and short course of prednisone as this will treat gout, inflammation, arthritis.  Patient is encouraged to monitor blood glucose very closely while on prednisone and voiced understanding.  Patient advised to stop prednisone if blood glucose is in the 300s.  Last echocardiogram EF was 60 to 65% so prednisone should be safe  but was advised of risk.  Patient advised to follow-up with PCP or urgent [name] if her blood sugars are elevated related to prednisone.  Advised patient to follow-up if foot pain persists or worsens as well.  No concern for DVT on exam.  Discussed return precautions.  Patient verbalized understanding and was agreeable with plan.  Final diagnoses:  Left foot pain     Discharge Instructions      You have been prescribed prednisone to decrease inflammation associated with your foot pain.  Please monitor blood sugar very closely and follow-up with primary [name] doctor if it starts to be elevated in the 300s.  Also recommend ice application and elevation of foot which will be helpful.  Follow-up if symptoms persist or worsen.     ED Prescriptions     Medication Sig  Dispense Auth. Provider   predniSONE (DELTASONE) 10 MG tablet Take 1 tablet (10 mg total) by mouth daily for 5 days. 5 tablet [place], Michele Rockers, [place]      PDMP not reviewed this encounter.   Teodora Medici, [place] 07/02/22 [phone]

## 2022-07-02 NOTE — ED Triage Notes (Signed)
Pt presents to [facility] with co of L leg pain and swelling. Pt reports pmh of gout and had a recent flair up last week and she takes tart cherry for and symptoms subsided. But symptoms returned in ankle joint this time on Tuesday. Pt reports tylenol for pain.

## 2022-07-02 NOTE — Discharge Instructions (Signed)
You have been prescribed prednisone to decrease inflammation associated with your foot pain.  Please monitor blood sugar very closely and follow-up with primary care doctor if it starts to be elevated in the 300s.  Also recommend ice application and elevation of foot which will be helpful.  Follow-up if symptoms persist or worsen.

## 2022-07-17 ENCOUNTER — Ambulatory Visit (HOSPITAL_COMMUNITY)
Admission: RE | Admit: 2022-07-17 | Discharge: 2022-07-17 | Disposition: A | Discharge status: Home / Self Care | Payer: Managed Care, Other (non HMO) | Source: Ambulatory Visit | Attending: Internal Medicine | Admitting: Internal Medicine

## 2022-07-17 DIAGNOSIS — E042 Nontoxic multinodular goiter: Secondary | ICD-10-CM | POA: Diagnosis present

## 2022-08-24 ENCOUNTER — Encounter: Payer: Self-pay | Admitting: *Deleted

## 2022-08-28 ENCOUNTER — Encounter: Payer: Self-pay | Admitting: Adult Health

## 2022-08-28 ENCOUNTER — Telehealth (INDEPENDENT_AMBULATORY_CARE_PROVIDER_SITE_OTHER): Payer: Managed Care, Other (non HMO) | Admitting: Adult Health

## 2022-08-28 VITALS — HR 103 | Temp 99.7°F | Ht 65.5 in | Wt 256.0 lb

## 2022-08-28 DIAGNOSIS — B349 Viral infection, unspecified: Secondary | ICD-10-CM

## 2022-08-28 DIAGNOSIS — R051 Acute cough: Secondary | ICD-10-CM

## 2022-08-28 MED ORDER — FLUTICASONE PROPIONATE 50 MCG/ACT NA SUSP: 2.0000 | Freq: Every day | NASAL | 6 refills | Status: DC

## 2022-08-28 MED ORDER — BENZONATATE 200 MG PO CAPS: 200.0000 mg | ORAL_CAPSULE | Freq: Two times a day (BID) | ORAL | 0 refills | Status: DC | PRN

## 2022-08-28 MED ORDER — HYDROCODONE BIT-HOMATROP MBR 5-1.5 MG/5ML PO SOLN: 5.0000 mL | Freq: Three times a day (TID) | ORAL | 0 refills | Status: DC | PRN

## 2022-08-28 NOTE — Progress Notes (Signed)
Virtual Visit via Video Note  I connected with Lisa [name] on 08/28/22 at  3:00 PM EDT by a video enabled telemedicine application and verified that I am speaking with the correct person using two identifiers.  Location patient: home Location provider:work or home office Persons participating in the virtual visit: patient, provider  I discussed the limitations of evaluation and management by telemedicine and the availability of in person appointments. The patient expressed understanding and agreed to proceed.   HPI: 61 year old female who is being evaluated today for an acute issue.  Her symptoms started 3 days ago.  Symptoms include cough, mildly sore throat, headache, body aches, chest soreness, and chills.  She reports that her 36 year old son came home with the same symptoms, her 25 year old son then had the same symptoms, her husband then developed the same symptoms and now she has the symptoms.  The rest of her family members have started to feel better.  Her 57 year old son was tested for COVID, flu, and strep by his pediatrician and all of them were negative.  The cough is causing issues with sleep.  Denies fevers, wheezing, shortness of breath, sinus pain and pressure   ROS: See pertinent positives and negatives per HPI.  Past Medical History:  Diagnosis Date   Allergic rhinitis    Arthritis    Asthma    Breast nodule    right breast, to see dr Helane Rima 06-20-2012 for    Chest pain, non-cardiac    Chronic headaches    Cigarette smoker    Complication of anesthesia 05-17-2012   trouble breathing after colonscopy, needed nebulizer   COPD (chronic obstructive pulmonary disease) ([place])    Diastolic congestive heart failure ([place]) 04/17/2019   Diverticulosis    GERD (gastroesophageal reflux disease)    Hepatic hemangioma    Hypertension    IBS (irritable bowel syndrome)    Nuclear stress test    Myoview 05/2019: EF 63, no ischemia. Low Risk   Obesity    Prosthetic eye globe     Pyloric erosion    Sleep apnea    CPAP setting varies from 4-10   Type 2 diabetes mellitus ([place]) 03/11/2017    Past Surgical History:  Procedure Laterality Date   ABDOMINAL AORTOGRAM W/LOWER EXTREMITY N/A 04/30/2017   Procedure: Abdominal Aortogram w/Lower Extremity;  Surgeon: Elam Dutch, MD;  Location: [facility];  Service: Cardiovascular;  Laterality: N/A;   CARDIAC CATHETERIZATION  12/28/2007   EF 75%. IT REVEALS NORMAL/SUPRANORMAL LEFT VENTRICULAR SYSTOLIC FUNCTION   CARDIOVASCULAR STRESS TEST  03/25/2007   EF 78%   CESAREAN SECTION  2003, 2009   x 2   CHOLECYSTECTOMY  1993   COLONOSCOPY  2014   ENDOMETRIAL ABLATION  09/2010   and D&C   HERNIA REPAIR  8416   umbilical   LAPAROSCOPY     PERIPHERAL VASCULAR BALLOON ANGIOPLASTY Left 04/30/2017   Procedure: Peripheral Vascular Balloon Angioplasty;  Surgeon: Elam Dutch, MD;  Location: [facility];  Service: Cardiovascular;  Laterality: Left;  SFA   RECONSTRUCTION OF EYELID Bilateral    TUBAL LIGATION     UMBILICAL HERNIA REPAIR  2004   US ECHOCARDIOGRAPHY  05/08/2008   EF 55-60%   VENTRAL HERNIA REPAIR  06/15/2012   Procedure: LAPAROSCOPIC VENTRAL HERNIA;  Surgeon: Edward Jolly, MD;  Location: [facility];  Service: General;  Laterality: N/A;  LAPAROSCOPIC REPAIR VENTRAL HERNIA   VIDEO BRONCHOSCOPY Bilateral 11/15/2017   Procedure: VIDEO BRONCHOSCOPY WITH FLUORO;  Surgeon: Collene Gobble, MD;  Location: [facility];  Service: Cardiopulmonary;  Laterality: Bilateral;    Family History  Problem Relation Age of Onset   Hypertension Mother    Lung cancer Mother        22   Heart disease Father        pacemaker or defibrillator   Breast cancer Maternal Aunt 78   Heart attack Paternal Uncle    Diabetes Maternal Grandmother    Colon cancer Neg Hx    Esophageal cancer Neg Hx    Pancreatic cancer Neg Hx        Current Outpatient Medications:    benzonatate (TESSALON) 200 MG capsule, Take 1 capsule  (200 mg total) by mouth 2 (two) times daily as needed for cough., Disp: 20 capsule, Rfl: 0   fluticasone (FLONASE) 50 MCG/ACT nasal spray, Place 2 sprays into both nostrils daily., Disp: 16 g, Rfl: 6   HYDROcodone bit-homatropine (HYCODAN) 5-1.5 MG/5ML syrup, Take 5 mLs by mouth every 8 (eight) hours as needed for cough., Disp: 120 mL, Rfl: 0   amoxicillin-clavulanate (AUGMENTIN) 875-125 MG tablet, Take 1 tablet by mouth every 12 (twelve) hours., Disp: 14 tablet, Rfl: 0   ASPIRIN 81 PO, Take 81 mg by mouth daily., Disp: , Rfl:    Bempedoic Acid-Ezetimibe (NEXLIZET) 180-10 MG TABS, Take 1 tablet by mouth daily. Please call [phone] to schedule an overdue appointment for future refills. Thank you. 3rd attempt., Disp: 15 tablet, Rfl: 0   buPROPion (WELLBUTRIN XL) 150 MG 24 hr tablet, Take 1 tablet (150 mg total) by mouth daily., Disp: 90 tablet, Rfl: 3   carvedilol (COREG) 25 MG tablet, TAKE 1 TABLET BY MOUTH TWICE A DAY, Disp: 60 tablet, Rfl: 0   dicyclomine (BENTYL) 10 MG capsule, Take 1 capsule (10 mg total) by mouth 3 (three) times daily before meals. As needed, Disp: 270 capsule, Rfl: 3   Dulaglutide (TRULICITY) 9.79 GX/2.1JH SOPN, Inject 0.75 mg into the skin once a week., Disp: 2 mL, Rfl: 3   Evolocumab (REPATHA SURECLICK) 417 MG/ML SOAJ, Inject 1 Pen into the skin every 14 (fourteen) days., Disp: 6 mL, Rfl: 3   HYDROcodone-acetaminophen (NORCO/VICODIN) 5-325 MG tablet, Take 1 tablet by mouth every 4 (four) hours as needed., Disp: 8 tablet, Rfl: 0   ONE TOUCH LANCETS MISC, Use to check blood sugars twice a day, Disp: 200 each, Rfl: 0   pantoprazole (PROTONIX) 40 MG tablet, TAKE 1 TABLET BY MOUTH 30 MINS BEFORE BREAKFAST DAILY., Disp: 90 tablet, Rfl: 1   sucralfate (CARAFATE) 1 g tablet, Take 1 tablet (1 g total) by mouth 4 (four) times daily -  with meals and at bedtime for 7 days., Disp: 28 tablet, Rfl: 0   telmisartan (MICARDIS) 40 MG tablet, Take 1 tablet (40 mg total) by mouth daily.,  Disp: 90 tablet, Rfl: 3  EXAM:  VITALS per patient if applicable:  GENERAL: alert, oriented, appears well and in no acute distress  HEENT: atraumatic, conjunttiva clear, no obvious abnormalities on inspection of external nose and ears  NECK: normal movements of the head and neck  LUNGS: on inspection no signs of respiratory distress, breathing rate appears normal, no obvious gross SOB, gasping or wheezing  CV: no obvious cyanosis  MS: moves all visible extremities without noticeable abnormality  PSYCH/NEURO: pleasant and cooperative, no obvious depression or anxiety, speech and thought processing grossly intact  ASSESSMENT AND PLAN:  Discussed the following assessment and plan:  1. Viral illness -Likely  viral illness since the rest of her family have had the same symptoms and started feeling better within a week.  Will prescribe Flonase, Tessalon Perles, and Hycodan cough syrup.  She was advised just to take the Hycodan cough syrup at nighttime as it will make her sleepy. - fluticasone (FLONASE) 50 MCG/ACT nasal spray; Place 2 sprays into both nostrils daily.  Dispense: 16 g; Refill: 6  2. Acute cough  - HYDROcodone bit-homatropine (HYCODAN) 5-1.5 MG/5ML syrup; Take 5 mLs by mouth every 8 (eight) hours as needed for cough.  Dispense: 120 mL; Refill: 0 - benzonatate (TESSALON) 200 MG capsule; Take 1 capsule (200 mg total) by mouth 2 (two) times daily as needed for cough.  Dispense: 20 capsule; Refill: 0     I discussed the assessment and treatment plan with the patient. The patient was provided an opportunity to ask questions and all were answered. The patient agreed with the plan and demonstrated an understanding of the instructions.   The patient was advised to call back or seek an in-person evaluation if the symptoms worsen or if the condition fails to improve as anticipated.   Dorothyann Peng, NP

## 2022-08-31 ENCOUNTER — Telehealth: Payer: Self-pay | Admitting: Family Medicine

## 2022-08-31 NOTE — Telephone Encounter (Signed)
Scheduled with Dr. Randol Kern tomorrow-very sweet patient with high healthcare related anxiety

## 2022-08-31 NOTE — Telephone Encounter (Signed)
FYI

## 2022-08-31 NOTE — Telephone Encounter (Signed)
Patient Name: Lisa [name] Gender: Female DOB: 10-Oct-1961 Age: 61 Y 11 M 18 D Return Phone Number: [phone] (Primary) Address: City/ State/ Zip: [place]  [address] Client [facility] [facility] [place] Client Site [place] [facility] [place] [facility] Provider Garret Reddish- MD Contact Type Call Who Is Calling Patient / Member / Family / Caregiver Call Type Triage / Clinical Relationship To Patient Self Return Phone Number [phone] (Primary) Chief Complaint NECK - unable to bend and has fever Reason for Call Symptomatic / Request for [facility] states she is experiencing back pain chills fever and stiff neck. Translation No Nurse Assessment Nurse: Hassell Done, RN, Joelene Millin Date/Time Eilene Ghazi Time): 08/31/2022 9:12:38 AM Confirm and document reason for call. If symptomatic, describe symptoms. ---caller states she has back pain, fever, chills and stiff neck. no current fever. Does the patient have any new or worsening symptoms? ---Yes Will a triage be completed? ---Yes Related visit to physician within the last 2 weeks? ---Yes Does the PT have any chronic conditions? (i.e. diabetes, asthma, this includes High risk factors for pregnancy, etc.) ---Yes List chronic conditions. ---COPD, bronchitis Is this a behavioral health or substance abuse call? ---No Guidelines Guideline Title Affirmed Question Affirmed Notes Nurse Date/Time (Eastern Time) Back Pain [1] MODERATE back pain (e.g., interferes with normal activities) AND [2] present > 3 days Alanda Amass 08/31/2022 9:16:52 AM PLEASE NOTE: All timestamps contained within this report are represented as [place] Standard Time. CONFIDENTIALTY NOTICE: This fax transmission is intended only for the addressee. It contains information that is legally privileged, confidential or otherwise protected from use or disclosure. If you are not the intended  recipient, you are strictly prohibited from reviewing, disclosing, copying using or disseminating any of this information or taking any action in reliance on or regarding this information. If you have received this fax in error, please notify us immediately by telephone so that we can arrange for its return to [name]. Phone: [phone], Toll-Free: [phone], Fax: [phone] Page: 2 of 2 Call Id: [id number] [place]. Time Eilene Ghazi Time) Disposition Final User 08/31/2022 9:08:22 AM Send to Urgent Queue Memory Argue 08/31/2022 9:24:07 AM SEE PCP WITHIN 3 DAYS Yes Hassell Done RN, Joelene Millin Final Disposition 08/31/2022 9:24:07 AM SEE PCP WITHIN 3 DAYS Yes Hassell Done, RN, Renea Ee Disagree/Comply Comply Caller Understands Yes PreDisposition Call Doctor Care Advice Given Per Guideline SEE PCP WITHIN 3 DAYS: * You need to be seen within 2 or 3 days. * PCP VISIT: Call your doctor (or NP/PA) during regular office hours and make an appointment. A clinic or urgent care center are good places to go for care if your doctor's office is closed or you can't get an appointment. NOTE: If office will be open tomorrow, tell caller to call then, not in 3 days. USE HEAT: * Use a heat pack, heating pad, or warm wet washcloth. * Do this for 10 minutes three times a day. ACTIVITY: * Continue normal activities as much as your pain allows. Staying active is more healing for the back than rest. * Avoid heavy lifting and strenuous exercise until completely well. * Avoid any activities that increase the pain a lot. * Complete bed rest is not needed and can make you feel more stiff. PAIN MEDICINES: * For pain relief, you can take either acetaminophen, ibuprofen, or naproxen. CALL BACK IF: * Numbness or weakness occurs, or bowel/bladder problems * There are any urine symptoms or fever * You become worse  CARE ADVICE given per Back Pain (Adult) guideline.

## 2022-08-31 NOTE — Telephone Encounter (Signed)
Patient states: - Had VV with Dorothyann Peng due to sore throat, headache, and body aches. Was informed if additional sx occur to let PCP know  - 3 days ago started having right sided lower back pain, chills, fever ranging between 99.1 and 100.8, and stiff neck  - Still has a headache but it's bearable   I have transferred pt to triage.

## 2022-09-01 ENCOUNTER — Encounter: Payer: Self-pay | Admitting: Internal Medicine

## 2022-09-01 ENCOUNTER — Ambulatory Visit (INDEPENDENT_AMBULATORY_CARE_PROVIDER_SITE_OTHER): Payer: Managed Care, Other (non HMO) | Admitting: Internal Medicine

## 2022-09-01 VITALS — BP 111/72 | HR 72 | Temp 97.7°F | Ht 65.5 in | Wt 252.8 lb

## 2022-09-01 DIAGNOSIS — R6889 Other general symptoms and signs: Secondary | ICD-10-CM | POA: Diagnosis not present

## 2022-09-01 DIAGNOSIS — U071 COVID-19: Secondary | ICD-10-CM

## 2022-09-01 LAB — POC COVID19 BINAXNOW

## 2022-09-01 LAB — POCT INFLUENZA A/B

## 2022-09-01 MED ORDER — METHOCARBAMOL 750 MG PO TABS: 750.0000 mg | ORAL_TABLET | Freq: Four times a day (QID) | ORAL | 0 refills | Status: DC

## 2022-09-01 NOTE — Progress Notes (Signed)
[place] [facility] [employer]:  [phone]   Routine Medical Office Visit  Patient:  Lisa [name]      Age: 61 y.o.       Sex:  female  Date:   09/01/2022  PCP:    Lisa [name], [place] Provider: Loralee Pacas, MD  Assessment/Plan:   [name] was seen today for slight cough, lower back pain, chills and hoarse.  Flu-like symptoms -     POC COVID-19 BinaxNow  COVID -     Methocarbamol; Take 1 tablet (750 mg total) by mouth 4 (four) times daily. 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  I suspect that the headache on day 1 was actually a bit of brain involvement which did not actually progress and actually has resolved so I do not see any need for hospitalization despite the concern for meningitis as she appears to be slowly improving each day.  I advised her that if she starts to get more dehydrated and cannot stay hydrated or if she starts to get really high fevers or any obviously any signs of confusion or abnormal vital signs then she will need to go to the ER for possible admission but this is unlikely based on her progress so far it seems that her immune system is fighting the infection very well  Reassured her she should just quarantine as it is already 4 days and and she is improving I do not think Paxil that is worthwhile I encouraged her to get hydrated and just take OTC meds as desired I offered other medication she wants to avoid sedatives but did agree to a nonsedating muscle relaxer for the back pain  Wrote work excuse for 4 more days and advised quarantine and early testing for symptomatic contacts.    Subjective:   Lisa [name] is a 61 y.o. female with PMH significant for: Past Medical  History:  Diagnosis Date   Allergic rhinitis    Arthritis    Asthma    Breast nodule    right breast, to see dr Helane Rima 06-20-2012 for    Chest pain, non-cardiac    Chronic headaches    Cigarette smoker    Complication of anesthesia 05-17-2012   trouble breathing after colonscopy, needed nebulizer   COPD (chronic obstructive pulmonary disease) ([place])    Diastolic congestive heart failure ([place]) 04/17/2019   Diverticulosis    GERD (gastroesophageal reflux disease)    Hepatic hemangioma    Hypertension    IBS (irritable bowel syndrome)    Nuclear stress test    Myoview 05/2019: EF 63, no ischemia. Low Risk   Obesity    Prosthetic eye globe    Pyloric erosion    Sleep apnea    CPAP setting varies from 4-10   Type 2 diabetes mellitus ([place]) 03/11/2017     She main concern for today's visit is: Chief Complaint  Patient presents with   Slight cough    Productive.   Lower back pain   Chills    Intermittent, helps to take ibuprofen.   Hoarse    Slightly, started yesterday.     Additional physician collected history: Backpain  hurts across the low back bilateral,  but mostly on the right. Leaning forward and putting arms behind back relieves.  Moving around relieves. Its intermittent with intensity varying 3-8 rapid escalation of pain with any movement.  Feels like deep, maybe muscle ache. Came on slowly about  3-4 days.  Never radiates, but lower neck did hurt briefly in first couple days now gone.  Not clear if ibuprofen helping this Headache came and  went first couple days, across the forehead. Not throbbing.  Felt like a sinus ha and wen away with flonase Chills were severe first 2 days but has subsided slowly over last 3-4 days, ibuprofen semes to have helped it.  Hoarse was just there one day Has covid tests at home hasn't used.  Has a productive cough x 4.   Husband was sick with symptoms before her           Objective:  Physical Exam: BP 111/72 (BP Location: Left Arm,  Patient Position: Sitting)   Pulse 72   Temp 97.7 F (36.5 C) (Temporal)   Ht 5' 5.5" (1.664 m)   Wt 252 lb 12.8 oz (114.7 kg)   SpO2 92%   BMI 41.43 kg/m   She  is a polite, friendly, and genuine person Constitutional: NAD, AAO, not ill-appearing  Neuro: alert, no focal deficit obvious, articulate speech Psych: normal mood, behavior, thought content   Problem specific physical exam findings:  Her back is relieved by massage treatment to the lower paraspinal muscles She has a dry cough on exam but reports that she swallowed the productive production Her lungs and heart are clear and normal sounding and she has no increased work of breathing She does not look extremely ill enough to go to the hospital She does appear little dehydrated done mouth dryness there is and she does not look particularly comfortable  No images are attached to the encounter or orders placed in the encounter.    Results:  No results found for any visits on 09/01/22.   Recent Results (from the past 2160 hour(s))  POCT Glucose (Device for Home Use)     Status: Abnormal   Collection Time: 06/10/22  9:30 AM  Result Value Ref Range   Glucose Fasting, POC 330 (A) 70 - 99 mg/dL   POC Glucose    CBC with Differential     Status: Abnormal   Collection Time: 06/14/22 12:21 AM  Result Value Ref Range   WBC 11.0 (H) 4.0 - 10.5 K/uL   RBC 5.18 (H) 3.87 - 5.11 MIL/uL   Hemoglobin 15.7 (H) 12.0 - 15.0 g/dL   HCT 46.2 (H) 36.0 - 46.0 %   MCV 89.2 80.0 - 100.0 fL   MCH 30.3 26.0 - 34.0 pg   MCHC 34.0 30.0 - 36.0 g/dL   RDW 12.7 11.5 - 15.5 %   Platelets 256 150 - 400 K/uL   nRBC 0.0 0.0 - 0.2 %   Neutrophils Relative % 77 %   Neutro Abs 8.5 (H) 1.7 - 7.7 K/uL   Lymphocytes Relative 15 %   Lymphs Abs 1.6 0.7 - 4.0 K/uL   Monocytes Relative 7 %   Monocytes Absolute 0.8 0.1 - 1.0 K/uL   Eosinophils Relative 1 %   Eosinophils Absolute 0.1 0.0 - 0.5 K/uL   Basophils Relative 0 %   Basophils Absolute 0.0 0.0 -  0.1 K/uL   Immature Granulocytes 0 %   Abs Immature Granulocytes 0.04 0.00 - 0.07 K/uL    Comment: Performed at [facility]  [facility], [place] [address] [place], [place] [address]  Comprehensive metabolic panel     Status: Abnormal   Collection Time: 06/14/22 12:21 AM  Result Value Ref Range   Sodium 136 135 - 145 mmol/L   Potassium 3.6 3.5 - 5.1 mmol/L   Chloride 99 98 - 111 mmol/L   CO2 24 22 - 32 mmol/L   Glucose, Bld 232 (H) 70 - 99 mg/dL    Comment: Glucose reference range applies only to samples taken after fasting for at least 8 hours.   BUN 19 6 - 20 mg/dL   Creatinine, Ser 1.12 (H) 0.44 - 1.00 mg/dL   Calcium 9.6 8.9 - 10.3 mg/dL   Total Protein 7.4 6.5 - 8.1 g/dL   Albumin 3.6 3.5 - 5.0 g/dL   AST 14 (L) 15 - 41 U/L   ALT 24 0 - 44 U/L   Alkaline Phosphatase 80 38 - 126 U/L   Total Bilirubin 0.8 0.3 - 1.2 mg/dL   GFR, Estimated 57 (L) >60 mL/min    Comment: (NOTE) Calculated using the CKD-EPI Creatinine Equation (2021)    Anion gap 13 5 - 15    Comment: Performed at [facility] [address] [place], [place] [address]  Urinalysis, Routine w reflex microscopic Urine, Clean Catch     Status: Abnormal   Collection Time: 06/14/22 12:21 AM  Result Value Ref Range   Color, Urine YELLOW YELLOW   APPearance HAZY (A) CLEAR   Specific Gravity, Urine 1.020 1.005 - 1.030   pH 5.0 5.0 - 8.0   Glucose, UA NEGATIVE NEGATIVE mg/dL   Hgb urine dipstick MODERATE (A) NEGATIVE   Bilirubin Urine NEGATIVE NEGATIVE   Ketones, ur NEGATIVE NEGATIVE mg/dL   Protein, ur NEGATIVE NEGATIVE mg/dL   Nitrite NEGATIVE NEGATIVE   Leukocytes,Ua NEGATIVE NEGATIVE   RBC / HPF 0-5 0 - 5 RBC/hpf   WBC, UA 0-5 0 - 5 WBC/hpf   Bacteria, UA RARE (A) NONE SEEN   Squamous Epithelial / LPF 0-5 0 - 5   Mucus PRESENT     Comment: Performed at [facility], [place] [address], [place], [place] [address]

## 2022-09-01 NOTE — Patient Instructions (Addendum)
It was a pleasure seeing you today!  Today the plan is...  Flu-like symptoms -     POC COVID-19 BinaxNow  COVID -     Methocarbamol; Take 1 tablet (750 mg total) by mouth 4 (four) times daily. For back pain  Dispense: 40 tablet; Refill: 0     Loralee Pacas, MD   Return if symptoms worsen or fail to improve.   - If your condition fails to resolve or you have other questions / concerns: please contact me via phone [phone] or MyChart messaging.  - Please bring all your medicines to your next appointment. This is the best way for me to know exactly what you're taking.  - If your condition begins to worsen or become severe:  go to the ER.

## 2022-09-02 ENCOUNTER — Encounter: Payer: Self-pay | Admitting: Family Medicine

## 2022-09-02 ENCOUNTER — Ambulatory Visit (INDEPENDENT_AMBULATORY_CARE_PROVIDER_SITE_OTHER): Payer: Managed Care, Other (non HMO) | Admitting: Family Medicine

## 2022-09-02 VITALS — BP 118/70 | HR 83 | Temp 97.7°F | Ht 65.5 in | Wt 254.4 lb

## 2022-09-02 DIAGNOSIS — M546 Pain in thoracic spine: Secondary | ICD-10-CM

## 2022-09-02 DIAGNOSIS — U071 COVID-19: Secondary | ICD-10-CM | POA: Diagnosis not present

## 2022-09-02 MED ORDER — HYDROCODONE-ACETAMINOPHEN 10-325 MG PO TABS: 1.0000 | ORAL_TABLET | Freq: Three times a day (TID) | ORAL | 0 refills | Status: AC | PRN

## 2022-09-02 NOTE — Progress Notes (Signed)
[facility] [address] [place] [address] Dept: [phone] Dept Fax: [phone]  Office Visit  Subjective:    Patient ID: Lisa [name], female    DOB: [birth year], 61 y.o..   MRN: [id number]  Chief Complaint  Patient presents with   Acute Visit    Co having back pain, dizziness/chill x 5 days.  Was positive 09/01/22.  Has been using heating pads.     History of Present Illness:  Patient is in today for reassessment of her back pain. Lisa [name] first began having symptoms of illness last week. She experienced headache, neck stiffness, dizziness and a sensation of floating, fever (up to 102 F), chills, and mid right back pain. Her son had been sick around that same time, but tested negative repeatedly for COVID. Lisa [name] was seen for a video visit on 9/29 and felt to likely have a viral illness. She was prescribed a steroid nasal spray, Hycodan syrup, and Tessalon perles. She was seen by Dr. Randol Kern yesterday. He was concerned that her headache, stiff neck, and back pain might represent mild viral meningitis. Her COVID test in his office was positive. He provided her with methocarbamol.  Lisa [name] notes the back pain is her most distressing symptom. She took one dose of the methocarbamol, but felt it did not help her, so she has not taken more. She has been using some ibuprofen. She is not having significant cough. She has had back issues int he lumbar spine previously. The current back pain does not radiate and she has no LE symptoms.  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   CKD (chronic kidney disease), stage III ([place]) 09/18/2020   Fatty liver 11/06/2019   Chronic rhinitis 06/26/2019   Trigger finger, right middle finger 05/04/2019   Coronary artery calcification 54/65/0354   Diastolic congestive heart failure ([place]) 04/17/2019   Bilateral leg pain 04/14/2018   Hyperlipidemia 04/12/2018   S/P bronchoscopy with biopsy    Hyperlipidemia associated with type 2 diabetes mellitus ([place]) 09/06/2017   Breast mass 08/03/2017   Chronic hip pain 07/29/2017   PAD (peripheral artery disease) ([place]) 01/19/2017   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 ([place]) 01/14/2017   Left knee pain 05/15/2016   Flank pain 04/21/2016   Abdominal pain 02/28/2016   GERD (gastroesophageal reflux disease) 02/14/2016   Morbid obesity ([place]) 02/14/2016   Tobacco use disorder 08/12/2015   Pulmonary nodule, right 04/11/2015   COPD (chronic obstructive pulmonary disease) ([place]) 01/18/2015   Mediastinal lymphadenopathy 01/18/2015   DOE (dyspnea on exertion) 03/08/2014   Migraine without aura 07/26/2013   Sleep apnea 02/11/2012   Chest discomfort 10/12/2011   Essential hypertension 10/12/2011   Past Surgical History:  Procedure Laterality Date   ABDOMINAL AORTOGRAM W/LOWER EXTREMITY N/A 04/30/2017   Procedure: Abdominal Aortogram w/Lower Extremity;  Surgeon: Elam Dutch, MD;  Location: [facility];  Service: Cardiovascular;  Laterality: N/A;   CARDIAC CATHETERIZATION  12/28/2007   EF 75%. IT REVEALS NORMAL/SUPRANORMAL LEFT VENTRICULAR SYSTOLIC FUNCTION   CARDIOVASCULAR STRESS TEST  03/25/2007   EF 78%  CESAREAN SECTION  2003, 2009   x 2   CHOLECYSTECTOMY  1993   COLONOSCOPY  2014   ENDOMETRIAL ABLATION  09/2010   and D&C   HERNIA REPAIR  9476   umbilical   LAPAROSCOPY     PERIPHERAL VASCULAR BALLOON ANGIOPLASTY Left 04/30/2017   Procedure: Peripheral Vascular Balloon Angioplasty;  Surgeon:  Elam Dutch, MD;  Location: [facility];  Service: Cardiovascular;  Laterality: Left;  SFA   RECONSTRUCTION OF EYELID Bilateral    TUBAL LIGATION     UMBILICAL HERNIA REPAIR  2004   US ECHOCARDIOGRAPHY  05/08/2008   EF 55-60%   VENTRAL HERNIA REPAIR  06/15/2012   Procedure: LAPAROSCOPIC VENTRAL HERNIA;  Surgeon: Edward Jolly, MD;  Location: [facility];  Service: General;  Laterality: N/A;  LAPAROSCOPIC REPAIR VENTRAL HERNIA   VIDEO BRONCHOSCOPY Bilateral 11/15/2017   Procedure: VIDEO BRONCHOSCOPY WITH FLUORO;  Surgeon: Collene Gobble, MD;  Location: [facility];  Service: Cardiopulmonary;  Laterality: Bilateral;   Family History  Problem Relation Age of Onset   Hypertension Mother    Lung cancer Mother        56   Heart disease Father        pacemaker or defibrillator   Breast cancer Maternal Aunt 78   Heart attack Paternal Uncle    Diabetes Maternal Grandmother    Colon cancer Neg Hx    Esophageal cancer Neg Hx    Pancreatic cancer Neg Hx    Outpatient Medications Prior to Visit  Medication Sig Dispense Refill   ASPIRIN 81 PO Take 81 mg by mouth daily.     benzonatate (TESSALON) 200 MG capsule Take 1 capsule (200 mg total) by mouth 2 (two) times daily as needed for cough. 20 capsule 0   buPROPion (WELLBUTRIN XL) 150 MG 24 hr tablet Take 1 tablet (150 mg total) by mouth daily. 90 tablet 3   carvedilol (COREG) 25 MG tablet TAKE 1 TABLET BY MOUTH TWICE A DAY 60 tablet 0   dicyclomine (BENTYL) 10 MG capsule Take 1 capsule (10 mg total) by mouth 3 (three) times daily before meals. As needed 270 capsule 3   Dulaglutide (TRULICITY) 5.46 TK/3.5WS SOPN Inject 0.75 mg into the skin once a week. 2 mL 3   Evolocumab (REPATHA SURECLICK) 568 MG/ML SOAJ Inject 1 Pen into the skin every 14 (fourteen) days. 6 mL 3   fluticasone (FLONASE) 50 MCG/ACT nasal spray Place 2 sprays into both nostrils daily. 16 g 6   HYDROcodone bit-homatropine (HYCODAN) 5-1.5 MG/5ML syrup Take 5 mLs by  mouth every 8 (eight) hours as needed for cough. 120 mL 0   ONE TOUCH LANCETS MISC Use to check blood sugars twice a day 200 each 0   pantoprazole (PROTONIX) 40 MG tablet TAKE 1 TABLET BY MOUTH 30 MINS BEFORE BREAKFAST DAILY. 90 tablet 1   telmisartan (MICARDIS) 40 MG tablet Take 1 tablet (40 mg total) by mouth daily. 90 tablet 3   Bempedoic Acid-Ezetimibe (NEXLIZET) 180-10 MG TABS Take 1 tablet by mouth daily. Please call [phone] to schedule an overdue appointment for future refills. Thank you. 3rd attempt. (Patient not taking: Reported on 09/01/2022) 15 tablet 0   HYDROcodone-acetaminophen (NORCO/VICODIN) 5-325 MG tablet Take 1 tablet by mouth every 4 (four) hours as needed. (Patient not taking: Reported on 09/01/2022) 8 tablet 0   methocarbamol (ROBAXIN-750) 750 MG tablet Take 1 tablet (750 mg total) by mouth 4 (four) times daily. For back pain (Patient  not taking: Reported on 09/02/2022) 40 tablet 0   amoxicillin-clavulanate (AUGMENTIN) 875-125 MG tablet Take 1 tablet by mouth every 12 (twelve) hours. (Patient not taking: Reported on 09/01/2022) 14 tablet 0   No facility-administered medications prior to visit.   Allergies  Allergen Reactions   Almond Oil Anaphylaxis and Nausea And Vomiting    Almond-Nuts Only   Apple Juice Anaphylaxis   Ceftin [Cefuroxime] Other (See Comments)    Severe stomach pain - Diverticulitis flares up   Cherry Extract Anaphylaxis    Cherry fruit only    Other Anaphylaxis and Itching    Most fruits- Are NOT tolerated (PLEASE ASK PATIENT BEFORE GIVING, AS CERTAIN FRUITS ARE TOLERATED IN LIMITED QUANTITIES!!)   Guaifenesin & Derivatives Other (See Comments)    Restlessness, jittery   Hctz [Hydrochlorothiazide] Other (See Comments)    Causes a headache   Naproxen Other (See Comments)    Insomnia   Lisinopril Cough        Objective:   Today's Vitals   09/02/22 1039  BP: 118/70  Pulse: 83  Temp: 97.7 F (36.5 C)  TempSrc: Temporal  SpO2: 94%   Weight: 254 lb 6.4 oz (115.4 kg)  Height: 5' 5.5" (1.664 m)   Body mass index is 41.69 kg/m.   General: Well developed, well nourished. No acute distress. HEENT: Normocephalic, non-traumatic. 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 Conjunctiva   clear. External ears normal. EAC and TMs normal bilaterally. Nose with mild congestion and   rhinorrhea. Mucous membranes moist. Oropharynx clear. Good dentition. Neck: Supple. No lymphadenopathy. No thyromegaly. Lungs: Clear to auscultation bilaterally. No wheezing, rales or rhonchi. CV: RRR without murmurs or rubs. Pulses 2+ bilaterally. Back: Straight. No CVA tenderness bilaterally. Pain on palpation over the right flank/rib area. Psych: Alert and oriented. Normal mood and affect.  Health Maintenance Due  Topic Date Due   Pneumococcal Vaccine 41-53 Years old (2 - PCV) 06/16/2013   Diabetic kidney evaluation - Urine ACR  04/22/2018   OPHTHALMOLOGY EXAM  07/17/2021   INFLUENZA VACCINE  06/30/2022   FOOT EXAM  07/24/2022     Assessment & Plan:   1. COVID-19 Reviewed home care instructions for COVID. She should continue to wear a mask around others through this week. If symptoms, esp, dyspnea develops/worsens, recommend in-person evaluation at either an urgent care or the emergency room.  2. Acute right-sided thoracic back pain It is not clear to be that the back pain would suggest meningitis. I suspect she has an inflamed muscle related to her COVID illness, as she has tenderness on specific palpation. I reassured her. She should continue to use ibuprofen and apply heat to the area. I will have her follow up with her PCP next week.  - HYDROcodone-acetaminophen (NORCO) 10-325 MG tablet; Take 1 tablet by mouth every 8 (eight) hours as needed for up to 5 days.  Dispense: 15 tablet; Refill: 0  Return in about 1 week (around 09/09/2022) for Reassessment with Dr. Yong Channel (PCP).   Haydee Salter, MD

## 2022-09-08 ENCOUNTER — Ambulatory Visit (INDEPENDENT_AMBULATORY_CARE_PROVIDER_SITE_OTHER): Payer: Managed Care, Other (non HMO) | Admitting: Family Medicine

## 2022-09-08 ENCOUNTER — Encounter: Payer: Self-pay | Admitting: Family Medicine

## 2022-09-08 VITALS — BP 128/78 | HR 98 | Temp 98.1°F | Ht 65.0 in | Wt 252.6 lb

## 2022-09-08 DIAGNOSIS — I10 Essential (primary) hypertension: Secondary | ICD-10-CM | POA: Diagnosis not present

## 2022-09-08 DIAGNOSIS — E785 Hyperlipidemia, unspecified: Secondary | ICD-10-CM

## 2022-09-08 DIAGNOSIS — K625 Hemorrhage of anus and rectum: Secondary | ICD-10-CM

## 2022-09-08 DIAGNOSIS — Z8616 Personal history of COVID-19: Secondary | ICD-10-CM | POA: Diagnosis not present

## 2022-09-08 NOTE — Progress Notes (Signed)
Phone [phone] In person visit   Subjective:   Lisa [name] is a 61 y.o. year old very pleasant female patient who presents for/with See problem oriented charting Chief Complaint  Patient presents with   Follow-up    Pt is f/u on COVID and associated back pain.   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 ([place]) 01/14/2017    Priority: High   Morbid obesity ([place]) 02/14/2016    Priority: High   Tobacco use disorder 08/12/2015    Priority: High   COPD (chronic obstructive pulmonary disease) ([place]) 01/18/2015    Priority: High   Anxiety 03/24/2022    Priority: Medium    Aortic atherosclerosis ([place]) 09/10/2021    Priority: Medium    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    Priority: Medium    CKD (chronic kidney disease), stage III ([place]) 09/18/2020    Priority: Medium    Fatty liver 11/06/2019    Priority: Medium    Hyperlipidemia 04/12/2018    Priority: Medium    Hyperlipidemia associated with type 2 diabetes mellitus ([place]) 09/06/2017    Priority: Medium    Breast mass 08/03/2017    Priority: Medium    GERD (gastroesophageal reflux disease) 02/14/2016    Priority: Medium    DOE (dyspnea on exertion) 03/08/2014    Priority: Medium    Migraine without aura 07/26/2013    Priority: Medium    Sleep apnea 02/11/2012    Priority: Medium    Chest discomfort 10/12/2011    Priority: Medium    Essential hypertension 10/12/2011    Priority: Medium    Chronic rhinitis 06/26/2019    Priority: Low   Trigger finger, right middle finger 05/04/2019     Priority: Low   Bilateral leg pain 04/14/2018    Priority: Low   S/P bronchoscopy with biopsy     Priority: Low   Chronic hip pain 07/29/2017    Priority: Low   Left knee pain 05/15/2016    Priority: Low   Flank pain 04/21/2016    Priority: Low   Abdominal pain 02/28/2016    Priority: Low   Pulmonary nodule, right 04/11/2015    Priority: Low   Mediastinal lymphadenopathy 01/18/2015    Priority: Low   Multinodular goiter 06/10/2022   Type 2 diabetes mellitus with stage 3a chronic kidney disease, without long-term current use of insulin ([place]) 06/10/2022    Medications- reviewed and updated Current Outpatient Medications  Medication Sig Dispense Refill   ASPIRIN 81 PO Take 81 mg by mouth daily.     buPROPion (WELLBUTRIN XL) 150 MG 24 hr tablet Take 1 tablet (150 mg total) by mouth daily. 90 tablet 3   carvedilol (COREG) 25 MG tablet TAKE 1 TABLET BY MOUTH TWICE A DAY 60 tablet 0   dicyclomine (BENTYL) 10 MG capsule Take 1 capsule (10 mg total) by mouth 3 (three) times daily before meals. As needed 270 capsule 3   Dulaglutide (TRULICITY) 8.65 HQ/4.6NG SOPN Inject 0.75 mg into the skin once a week. 2 mL 3   fluticasone (FLONASE) 50  MCG/ACT nasal spray Place 2 sprays into both nostrils daily. 16 g 6   HYDROcodone bit-homatropine (HYCODAN) 5-1.5 MG/5ML syrup Take 5 mLs by mouth every 8 (eight) hours as needed for cough. 120 mL 0   methocarbamol (ROBAXIN-750) 750 MG tablet Take 1 tablet (750 mg total) by mouth 4 (four) times daily. For back pain 40 tablet 0   ONE TOUCH LANCETS MISC Use to check blood sugars twice a day 200 each 0   pantoprazole (PROTONIX) 40 MG tablet TAKE 1 TABLET BY MOUTH 30 MINS BEFORE BREAKFAST DAILY. 90 tablet 1   telmisartan (MICARDIS) 40 MG tablet Take 1 tablet (40 mg total) by mouth daily. 90 tablet 3   Evolocumab (REPATHA SURECLICK) 426 MG/ML SOAJ Inject 1 Pen into the skin every 14 (fourteen) days. (Patient not taking: Reported on 09/08/2022) 6 mL 3   No  current facility-administered medications for this visit.     Objective:  BP 128/78   Pulse 98   Temp 98.1 F (36.7 C)   Ht '5\' 5"'$  (1.651 m)   Wt 252 lb 9.6 oz (114.6 kg)   SpO2 100%   BMI 42.03 kg/m  Gen: NAD, resting comfortably, wearing mask today CV: RRR no murmurs rubs or gallops Lungs: CTAB no crackles, wheeze, rhonchi    Assessment and Plan   # Covid 19 S:first symptom 08/27/22 (son had been ill and ran through family)- went to doctor on 29th virtually- thought URI. Then saw Dr. Randol Kern on 09/01/22  who diagnosed covid (since she was improving he did not recommend paxlovid). Seen the next day by Dr. Gena Fray who gave some pain medicine for her back pain (she was never able to pick this up due to supply)  Fortunately her back pain has improved substantially down to 3/10 and respiratory symptoms have largely improved- gets chest congestion and upper chest tightness that resolves with albuterol. Muscle relaxant did not help.   Did have a bowel movement this AM with bright red blood and loose stool- movement shortly after with no blood  A/P: covid 19 largely resolved and outside quarantine period considering improvement. Ok to use labuterol as needed in morning - as seems to have mildly flared underlying respiratory disease.  -With some lingering respiratory disease would not hurt to continue to mask for a few more days until has further improvement but technically past isolation and masking period Given overall improvement including respiratory  For blood in stool. Did have colonoscopy 06/12/20 and plan for 3 year follow up due to polyps. Suspect hemorrhoids as cause of rectal bleeding but we opted to check bloodwork to make sur eno anemia today and also recommend stool cards a week or so from today. Similar episode 6 months ago- started after ozempic with straining - if she has more blood needs to let us know- may need repeat bloodwork and GI evaluation  #hypertension S: medication:  coreg 25 mg BID, telmisartan 40 mg BP Readings from Last 3 Encounters:  09/08/22 128/78  09/02/22 118/70  09/01/22 111/72  A/P: Blood pressure controlled on repeat-continue current medication  #hyperlipidemia S: Medication: on repatha when she can get it but reports supply issues- off of nexlizet Lab Results  Component Value Date   CHOL 147 09/08/2021   HDL 23.50 (L) 09/08/2021   LDLCALC 57 06/19/2020   LDLDIRECT 144.0 03/24/2022   TRIG 392.0 (H) 09/08/2021   CHOLHDL 6 09/08/2021   A/P: Patient is doing her best to control this when she is able to  get Repatha-very challenging overall though-suspect mild poor control-continue current medication and hope for better supply  Recommended follow up: Return for next already scheduled visit or sooner if needed. Future Appointments  Date Time Provider [place]  10/26/2022  2:20 PM Marin Olp, MD [facility]  11/03/2022  8:30 AM [facility] 2 [facility]   11/06/2022  2:20 PM Shamleffer, Melanie Crazier, MD [facility] None    Lab/Order associations:   ICD-10-CM   1. Bright red blood per rectum  K62.5 Fecal occult blood, imunochemical    CBC with Differential/Platelet    2. Essential hypertension  I10 Comprehensive metabolic panel    3. Hyperlipidemia, unspecified hyperlipidemia type  E78.5     4. History of COVID-19  Z86.16       No orders of the defined types were placed in this encounter.   Return precautions advised.  Garret Reddish, MD

## 2022-09-08 NOTE — Patient Instructions (Addendum)
Team bring stool cards for her  Labs in the room If you have mychart- we will send your results within 3 business days of [name] receiving them.  If you do not have mychart- we will call you about results within 5 business days of [name] receiving them.  *please also note that you will see labs on mychart as soon as they post. I will later go in and write notes on them- will say "notes from Dr. Yong Channel"   For blood in stool. Did have colonoscopy 06/12/20 and plan for 3 year follow up due to polyps. Suspect hemorrhoids as cause of rectal bleeding but we opted to check bloodwork to make sur eno anemia today and also recommend stool cards a week or so from today. Similar episode 6 months ago- started after ozempic with straining - if she has more blood needs to let us know- may need repeat bloodwork and GI evaluation - if chest pain, shortness of breath, worsening fatigue- seek care  As always recommend quitting smoking! Because we care about you! You can do this!   Recommended follow up: Return for next already scheduled visit or sooner if needed.

## 2022-09-09 LAB — CBC WITH DIFFERENTIAL/PLATELET
Basophils Absolute: 0.1 10*3/uL (ref 0.0–0.1)
Basophils Relative: 1.1 % (ref 0.0–3.0)
Eosinophils Absolute: 0.1 10*3/uL (ref 0.0–0.7)
Eosinophils Relative: 1.8 % (ref 0.0–5.0)
HCT: 41.1 % (ref 36.0–46.0)
Hemoglobin: 13.9 g/dL (ref 12.0–15.0)
Lymphocytes Relative: 31.6 % (ref 12.0–46.0)
Lymphs Abs: 1.8 10*3/uL (ref 0.7–4.0)
MCHC: 33.7 g/dL (ref 30.0–36.0)
MCV: 88.3 fl (ref 78.0–100.0)
Monocytes Absolute: 0.6 10*3/uL (ref 0.1–1.0)
Monocytes Relative: 10.3 % (ref 3.0–12.0)
Neutro Abs: 3.1 10*3/uL (ref 1.4–7.7)
Neutrophils Relative %: 55.2 % (ref 43.0–77.0)
Platelets: 302 10*3/uL (ref 150.0–400.0)
RBC: 4.66 Mil/uL (ref 3.87–5.11)
RDW: 14.2 % (ref 11.5–15.5)
WBC: 5.6 10*3/uL (ref 4.0–10.5)

## 2022-09-09 LAB — COMPREHENSIVE METABOLIC PANEL
ALT: 21 U/L (ref 0–35)
AST: 19 U/L (ref 0–37)
Albumin: 3.8 g/dL (ref 3.5–5.2)
Alkaline Phosphatase: 77 U/L (ref 39–117)
BUN: 19 mg/dL (ref 6–23)
CO2: 24 mEq/L (ref 19–32)
Calcium: 9.7 mg/dL (ref 8.4–10.5)
Chloride: 104 mEq/L (ref 96–112)
Creatinine, Ser: 1.15 mg/dL (ref 0.40–1.20)
GFR: 52.33 mL/min — ABNORMAL LOW (ref >60.00)
Glucose, Bld: 100 mg/dL — ABNORMAL HIGH (ref 70–99)
Potassium: 4 mEq/L (ref 3.5–5.1)
Sodium: 137 mEq/L (ref 135–145)
Total Bilirubin: 0.4 mg/dL (ref 0.2–1.2)
Total Protein: 7.6 g/dL (ref 6.0–8.3)

## 2022-10-06 NOTE — Progress Notes (Unsigned)
   I, Lisa [name], LAT, ATC acting as a scribe for Lisa Leader, MD.  Left hand dominant  Lisa [name] is a 61 y.o. female who presents to [place] [facility] today for left thumb pain. Pt is L-hand dominate Patient was previously seen by Dr. Georgina [name] on 02/26/2022 for chronic right shoulder pain.  Today, patient reports left thumb pain ongoing for a few months. 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 Pt notes she is unable to flex her L thumb.  Patient locates pain to radial aspect of the L thumb w/ triggering.   Grip strength: decreased and unable to hold a pen to write normally Aggravates: finger flex, grabbing objects Treatments tried: nothing   Pertinent review of systems: No fevers or chills  Relevant historical information: Peripheral arterial disease.  Prior trigger thumb.   Exam:  BP 138/84   Pulse 80   Ht '5\' 5"'$  (1.651 m)   Wt 255 lb (115.7 kg)   SpO2 98%   BMI 42.43 kg/m  General: Well Developed, well nourished, and in no acute distress.   MSK: Left thumb normal. Mildly tender palpation palmar aspect of the left thumb MCP. Little motion at IP joint and IP joint.  Intact motion at [facility] joint.  Within limits of motion and strength is intact to flexion and extension at IP and MCP. Capillary fill and pulses are intact.       Assessment and Plan: 61 y.o. female with left trigger thumb.  This was last injected February 2022.  She describes a trigger thumb that is been locked for over 3 months now.  On exam she has significant lack of range of motion which is consistent with severe trigger thumb.  I am not optimistic that an injection is going to help very much here.  We discussed this and the next step would be surgical referral to release the A1 pulley.  She is willing to do surgery for this therefore I think it is best just to refer directly to hand surgery.  Refer to Dr. Tempie [name].  Check back with me as  needed.   PDMP not reviewed this encounter. Orders Placed This Encounter  Procedures   [name] LIMITED JOINT SPACE STRUCTURES UP LEFT(NO LINKED CHARGES)    Order Specific Question:   Reason for Exam (SYMPTOM  OR DIAGNOSIS REQUIRED)    Answer:   left thumb pain    Order Specific Question:   Preferred imaging location?    Answer:   [place]   Ambulatory referral to Orthopedic Surgery    Referral Priority:   Routine    Referral Type:   Surgical    Referral Reason:   Specialty Services Required    Referred to Provider:   [name], MD    Requested Specialty:   Orthopedic Surgery    Number of Visits Requested:   1   No orders of the defined types were placed in this encounter.    Discussed warning signs or symptoms. Please see discharge instructions. Patient expresses understanding.   The above documentation has been reviewed and is accurate and complete Lisa [name], M.D.

## 2022-10-07 ENCOUNTER — Ambulatory Visit (INDEPENDENT_AMBULATORY_CARE_PROVIDER_SITE_OTHER): Payer: Managed Care, Other (non HMO) | Admitting: Gastroenterology

## 2022-10-07 ENCOUNTER — Encounter: Payer: Self-pay | Admitting: Gastroenterology

## 2022-10-07 ENCOUNTER — Ambulatory Visit (INDEPENDENT_AMBULATORY_CARE_PROVIDER_SITE_OTHER): Payer: Managed Care, Other (non HMO) | Admitting: Family Medicine

## 2022-10-07 ENCOUNTER — Ambulatory Visit: Payer: Self-pay

## 2022-10-07 VITALS — BP 138/84 | HR 80 | Ht 65.0 in | Wt 255.0 lb

## 2022-10-07 VITALS — BP 138/84 | HR 80 | Ht 65.0 in | Wt 254.0 lb

## 2022-10-07 DIAGNOSIS — K625 Hemorrhage of anus and rectum: Secondary | ICD-10-CM

## 2022-10-07 DIAGNOSIS — M65312 Trigger thumb, left thumb: Secondary | ICD-10-CM | POA: Diagnosis not present

## 2022-10-07 DIAGNOSIS — K648 Other hemorrhoids: Secondary | ICD-10-CM

## 2022-10-07 DIAGNOSIS — Z86010 Personal history of colonic polyps: Secondary | ICD-10-CM | POA: Diagnosis not present

## 2022-10-07 NOTE — Patient Instructions (Signed)
You can purchase over the counter preparation H suppositories to use daily x 7 days and then as needed.   Call if your rectal bleeding does not get better for consideration of hemorrhoid banding.   The [facility] providers would like to encourage you to use [facility] to communicate with providers for non-urgent requests or questions.  Due to long hold times on the telephone, sending your provider a message by [facility] may be a faster and more efficient way to get a response.  Please allow 48 business hours for a response.  Please remember that this is for non-urgent requests.   Thank you for choosing me and [facility].  Pricilla Riffle. Dagoberto Ligas., MD., Mar[facility] Regal

## 2022-10-07 NOTE — Progress Notes (Signed)
    Assessment     Hematochezia, secondary to internal hemorrhoids History of diverticulitis Personal history of adenomatous colon polyps   Recommendations    Prep H supp qd x 7d then qd prn Proceed with hemorrhoid banding if symptoms are recurrent Surveillance colonoscopy in July 2024   HPI    This is a 61 year old female with rectal bleeding post bowel movements for a couple months.   Colonoscopy July 2021 - Three 6 to 8 mm polyps in the rectum and in the transverse colon, removed with a cold snare. Resected and retrieved. - Moderate diverticulosis in the left colon. There was narrowing of the colon in association with the diverticular opening. There was evidence of diverticular spasm. Peri-diverticular erythema was seen. - Internal hemorrhoids. - The examination was otherwise normal on direct and retroflexion views. Path; 2 TAs, 1 SSP   Labs / Imaging       Latest Ref Rng & Units 09/08/2022    4:22 PM 06/14/2022   12:21 AM 04/24/2022    3:16 PM  Hepatic Function  Total Protein 6.0 - 8.3 g/dL 7.6  7.4  7.7   Albumin 3.5 - 5.2 g/dL 3.8  3.6    AST 0 - 37 U/L _0 ALT 0 - 35 U/L _1 Alk Phosphatase 39 - 117 U/L 77  80    Total Bilirubin 0.2 - 1.2 mg/dL 0.4  0.8  0.4        Latest Ref Rng & Units 09/08/2022    4:22 PM 06/14/2022   12:21 AM 04/24/2022    3:16 PM  CBC  WBC 4.0 - 10.5 K/uL 5.6  11.0  7.2   Hemoglobin 12.0 - 15.0 g/dL 13.9  15.7  15.4   Hematocrit 36.0 - 46.0 % 41.1  46.2  45.2   Platelets 150.0 - 400.0 K/uL 302.0  256  282     Current Medications, Allergies, Past Medical History, Past Surgical History, Family History and Social History were reviewed in [employer] record.   Physical Exam: General: Well developed, well nourished, no acute distress Head: Normocephalic and atraumatic Eyes: Sclerae anicteric, EOMI Ears: Normal auditory acuity Mouth: Not examined Lungs: Clear throughout to  auscultation Heart: Regular rate and rhythm; no murmurs, rubs or bruits Abdomen: Soft, non tender and non distended. No masses, hepatosplenomegaly or hernias noted. Normal Bowel sounds Rectal: No lesions, no tenderness, internal hemorrhoids palpated, heme neg brown stool Musculoskeletal: Symmetrical with no gross deformities  Pulses:  Normal pulses noted Extremities: No clubbing, cyanosis, edema or deformities noted Neurological: Alert oriented x 4, grossly nonfocal Psychological:  Alert and cooperative. Normal mood and affect   Shany Marinez T. Fuller Plan, MD 10/07/2022, 1:41 PM

## 2022-10-07 NOTE — Patient Instructions (Addendum)
Thank you for coming in today.   I've referred you to Orthopedic Surgery for a consultation.  Let us know if you don't hear from them in one week.

## 2022-10-16 ENCOUNTER — Other Ambulatory Visit: Payer: Self-pay | Admitting: Internal Medicine

## 2022-10-16 ENCOUNTER — Other Ambulatory Visit: Payer: Self-pay | Admitting: Gastroenterology

## 2022-10-26 ENCOUNTER — Ambulatory Visit: Payer: Managed Care, Other (non HMO) | Admitting: Family Medicine

## 2022-11-02 ENCOUNTER — Telehealth: Payer: Self-pay | Admitting: *Deleted

## 2022-11-02 NOTE — Telephone Encounter (Signed)
   Pre-operative Risk Assessment    Patient Name: Lisa [name]  DOB: 1961-11-23 MRN: [id number]     Request for Surgical Clearance    Procedure:   LEFT THUMB A1 PULLEY RELEASE  Date of Surgery:  Clearance TBD                                 Surgeon:  DR. CHARLES BENFIELD Surgeon's Group or Practice Name:  [facility] Phone number:  [phone] ATTN: [place] Fax number:  [phone]   Type of Clearance Requested:   - Medical ; ASA    Type of Anesthesia:  Local  WITH MAC   Additional requests/questions:    Jiles Prows   11/02/2022, 3:27 PM

## 2022-11-02 NOTE — Telephone Encounter (Signed)
   Name: Lisa [name]  DOB: 22-Aug-1961  MRN: [id number]  Primary Cardiologist: Mertie Moores, MD  Chart reviewed as part of pre-operative protocol coverage. Because of [name] past medical history and time since last visit, she will require a follow-up in-office visit in order to better assess preoperative cardiovascular risk. Last OV >1 year ago (01/2021).  Pre-op covering staff: - Please schedule appointment and call patient to inform them.  - Please contact requesting surgeon's office via preferred method (i.e, phone, fax) to inform them of need for appointment prior to surgery. Regarding ASA, will need to be reviewed at upcoming [place] but had normal cath in 2009 and low risk stress in 2020. Does have hx of PAD so when notifying them of upcoming visit, would recommend that they also get input from pt's vascular surgeon regarding holding ASA.  Charlie Pitter, PA-C  11/02/2022, 4:33 PM

## 2022-11-03 ENCOUNTER — Ambulatory Visit (HOSPITAL_COMMUNITY): Payer: Managed Care, Other (non HMO)

## 2022-11-03 NOTE — Telephone Encounter (Signed)
I left a message for the pt to call back and schedule an appt in the office for pre op clearance.

## 2022-11-04 NOTE — Telephone Encounter (Signed)
Pt has appt 11/06/22 with Christen Bame, NP for pre op clearance.

## 2022-11-05 NOTE — Progress Notes (Signed)
Cardiology Office Note:    Date:  11/06/2022   ID:  LILLIANAH SWARTZENTRUBER, DOB [birth year], MRN [id number]  PCP:  Marin Olp, MD   [facility] Providers Cardiologist:  Mertie Moores, MD     Referring MD: Marin Olp, MD   Chief Complaint: preoperative cardiac evaluation  History of Present Illness:    JESSABELLE MARKIEWICZ is a very pleasant 61 y.o. female with a hx of chest pain with normal coronary cath 2009, HTN, tobacco use, PAD, COPD, and palpitations.  She underwent angioplasty of her mid left superficial femoral artery in 2018.  At that time she also had mild stenosis of her right superficial femoral artery.  Intermittently noted to have some narrowing of her superficial femoral artery on surveillance duplex exam however was not clinically significant at the time and she had normal ABIs.  Last seen by Dr. Oneida Alar 10/31/2020 for recent right hip and right thigh and leg pain. She had lower extremity duplex which showed no significant SFA narrowing bilaterally.  ABIs with triphasic greater than 1 and normal bilaterally. She was advised to return in 1 year for follow-up.   Long history of chest pain with normal coronary arteries by cath 2009. Last cardiology clinic visit was 02/14/2021 with Dr. Acie Fredrickson. Chest wall pain felt to be 2/2 palpitations. She reported increased palpitations so 14 day Zio was ordered. Cardiac monitor 03/31/2021 revealed no significant arrhythmias, occasional PVCs.   Today, she is here for preoperative cardiac evaluation for left thumb A1 pulley release.  Has midsternal chest discomfort that is somewhat relieved by belching/flactuation but is pretty constant. Antiacids and Pantoprazole seem to help. Additionally, has shortness of breath that occurs with exertion. Is able to complete > 4 METS  activity without significant symptoms but admits to shortness of breath with walking up stairs or inclines, sometimes with walking on flat surface. SOB improves with rest. Does  not exercise on a consistent basis, tries to park far from doors when shopping because job is very sedentary. Continues to smoke. Attended [facility]oking Cessation program, did not help. Stopped Nexlizet due to leg weakness. Also previously took Praulent, is not sure why she stopped it.  She denies palpitations, orthopnea, PND, presyncope, syncope.  Past Medical History:  Diagnosis Date   Allergic rhinitis    Arthritis    Asthma    Breast nodule    right breast, to see dr Helane Rima 06-20-2012 for    Chest pain, non-cardiac    Chronic headaches    Cigarette smoker    Complication of anesthesia 05-17-2012   trouble breathing after colonscopy, needed nebulizer   COPD (chronic obstructive pulmonary disease) ([place])    Diastolic congestive heart failure ([place]) 04/17/2019   Diverticulosis    GERD (gastroesophageal reflux disease)    Hepatic hemangioma    Hypertension    IBS (irritable bowel syndrome)    Nuclear stress test    Myoview 05/2019: EF 63, no ischemia. Low Risk   Obesity    Prosthetic eye globe    Pyloric erosion    Sleep apnea    CPAP setting varies from 4-10   Type 2 diabetes mellitus ([place]) 03/11/2017    Past Surgical History:  Procedure Laterality Date   ABDOMINAL AORTOGRAM W/LOWER EXTREMITY N/A 04/30/2017   Procedure: Abdominal Aortogram w/Lower Extremity;  Surgeon: Elam Dutch, MD;  Location: [facility];  Service: Cardiovascular;  Laterality: N/A;   CARDIAC CATHETERIZATION  12/28/2007   EF 75%. IT REVEALS  NORMAL/SUPRANORMAL LEFT VENTRICULAR SYSTOLIC FUNCTION   CARDIOVASCULAR STRESS TEST  03/25/2007   EF 78%   CESAREAN SECTION  2003, 2009   x 2   CHOLECYSTECTOMY  1993   COLONOSCOPY  2014   ENDOMETRIAL ABLATION  09/2010   and D&C   HERNIA REPAIR  3500   umbilical   LAPAROSCOPY     PERIPHERAL VASCULAR BALLOON ANGIOPLASTY Left 04/30/2017   Procedure: Peripheral Vascular Balloon Angioplasty;  Surgeon: Elam Dutch, MD;  Location: [facility];  Service:  Cardiovascular;  Laterality: Left;  SFA   RECONSTRUCTION OF EYELID Bilateral    TUBAL LIGATION     UMBILICAL HERNIA REPAIR  2004   US ECHOCARDIOGRAPHY  05/08/2008   EF 55-60%   VENTRAL HERNIA REPAIR  06/15/2012   Procedure: LAPAROSCOPIC VENTRAL HERNIA;  Surgeon: Edward Jolly, MD;  Location: [facility];  Service: General;  Laterality: N/A;  LAPAROSCOPIC REPAIR VENTRAL HERNIA   VIDEO BRONCHOSCOPY Bilateral 11/15/2017   Procedure: VIDEO BRONCHOSCOPY WITH FLUORO;  Surgeon: Collene Gobble, MD;  Location: [facility];  Service: Cardiopulmonary;  Laterality: Bilateral;    Current Medications: Current Meds  Medication Sig   ASPIRIN 81 PO Take 81 mg by mouth daily.   buPROPion (WELLBUTRIN XL) 150 MG 24 hr tablet Take 1 tablet (150 mg total) by mouth daily.   carvedilol (COREG) 25 MG tablet TAKE 1 TABLET BY MOUTH TWICE A DAY   dicyclomine (BENTYL) 10 MG capsule Take 1 capsule (10 mg total) by mouth 3 (three) times daily before meals. As needed   Dulaglutide (TRULICITY) 9.38 HW/2.9HB SOPN INJECT 0.75 MG SUBCUTANEOUSLY ONE TIME PER WEEK   ONE TOUCH LANCETS MISC Use to check blood sugars twice a day   pantoprazole (PROTONIX) 40 MG tablet TAKE 1 TABLET BY MOUTH 30 MINS BEFORE BREAKFAST DAILY.   telmisartan (MICARDIS) 40 MG tablet Take 1 tablet (40 mg total) by mouth daily.     Allergies:   Almond oil, Apple juice, Ceftin [cefuroxime], Cherry extract, Other, Guaifenesin & derivatives, Hctz [hydrochlorothiazide], Naproxen, and Lisinopril   Social History   Socioeconomic History   Marital status: Married    Spouse name: Science writer   Number of children: 2   Years of education: 14+   Highest education level: Not on file  Occupational History   Occupation: Social Service  Tobacco Use   Smoking status: Every Day    Packs/day: 1.50    Years: 38.00    Total pack years: 57.00    Types: Cigarettes   Smokeless tobacco: Never   Tobacco comments:    10 cigarettes daily ARJ 09/10/2020  Vaping Use    Vaping Use: Never used  Substance and Sexual Activity   Alcohol use: No    Alcohol/week: 0.0 standard drinks of alcohol   Drug use: No   Sexual activity: Yes    Comment: intercourse age 5, less than 5 sexual partners,des neg  Other Topics Concern   Not on file  Social History Narrative   Married 1997. 2 sons 29 and 8 years old in 10/18. Wants to see children grow up.       Customer Service/accounts payable.    Went to school for medical assisting- has had a hard time finding work      Hobbies: [employer], enjoying [place], getting out of home, wants to take [employer]   Social Determinants of Radio broadcast assistant Strain: Not on [employer] Insecurity: Not on file  Transportation Needs:  Not on file  Physical Activity: Not on file  Stress: Not on file  Social Connections: Not on file     Family History: The patient's family history includes Breast cancer (age of onset: 60) in her maternal aunt; Diabetes in her maternal grandmother; Heart attack in her paternal uncle; Heart disease in her father; Hypertension in her mother; Lung cancer in her mother. There is no history of Colon cancer, Esophageal cancer, or Pancreatic cancer.  ROS:   Please see the history of present illness.    + DOE + chest discomfort All other systems reviewed and are negative.  Labs/Other Studies Reviewed:    The following studies were reviewed today:  Cardiac Monitor 03/31/21 Normal sinus rhythm Occasional PVCs No significant arrhythmia  Echo 03/13/21 1. Left ventricular ejection fraction, by estimation, is 60 to 65%. The  left ventricle has normal function. The left ventricle has no regional  wall motion abnormalities. There is moderate-to-severe assymetric  hypertrophy of the basal-septal segment. The   rest of the LV segments demonstrate moderate LVH. Left ventricular  diastolic parameters are consistent with Grade II diastolic dysfunction  (pseudonormalization).   2. Right ventricular  systolic function is normal. The right ventricular  size is normal.   3. The mitral valve is normal in structure. Trivial mitral valve  regurgitation. No evidence of mitral stenosis.   4. The aortic valve is tricuspid. There is mild calcification of the  aortic valve. There is mild thickening of the aortic valve. Aortic valve  regurgitation is trivial. Mild aortic valve sclerosis is present, with no  evidence of aortic valve stenosis.   5. The inferior vena cava is normal in size with greater than 50%  respiratory variability, suggesting right atrial pressure of 3 mmHg.   Comparison(s): No significant change from prior study.  Recent Labs: 03/24/2022: TSH 1.54 09/08/2022: ALT 21; BUN 19; Creatinine, Ser 1.15; Hemoglobin 13.9; Platelets 302.0; Potassium 4.0; Sodium 137  Recent Lipid Panel    Component Value Date/Time   CHOL 147 09/08/2021 1342   CHOL 104 06/19/2020 0758   TRIG 392.0 (H) 09/08/2021 1342   HDL 23.50 (L) 09/08/2021 1342   HDL 25 (L) 06/19/2020 0758   CHOLHDL 6 09/08/2021 1342   VLDL 78.4 (H) 09/08/2021 1342   LDLCALC 57 06/19/2020 0758   LDLDIRECT 144.0 03/24/2022 1502     Risk Assessment/Calculations:       Physical Exam:    VS:  BP 138/72   Pulse 76   Ht _0  (1.651 m)   Wt 258 lb (117 kg)   SpO2 97%   BMI 42.93 kg/m     Wt Readings from Last 3 Encounters:  11/06/22 258 lb (117 kg)  10/07/22 254 lb (115.2 kg)  10/07/22 255 lb (115.7 kg)     GEN: Obese, well developed in no acute distress HEENT: Normal NECK: No JVD; No carotid bruits CARDIAC: Distant RRR, no murmurs, rubs, gallops RESPIRATORY:  Clear to auscultation without rales, wheezing or rhonchi  ABDOMEN: Soft, non-tender, non-distended MUSCULOSKELETAL:  No edema; No deformity. 2+ pedal pulses, equal bilaterally SKIN: Warm and dry NEUROLOGIC:  Alert and oriented x 3 PSYCHIATRIC:  Normal affect   EKG:  EKG is ordered today.  The ekg ordered today demonstrates normal sinus rhythm at 76  bpm, minimal voltage criteria for LVH, no ST abnormality  Diagnoses:    1. Chest pain, unspecified type   2. DOE (dyspnea on exertion)   3. Aortic atherosclerosis ([place])   4.  Hyperlipidemia LDL goal <70   5. Essential hypertension   6. Tobacco use disorder   7. Preoperative cardiovascular examination    Assessment and Plan:     Preoperative cardiac evaluation: Based on ACC/AHA guidelines, the patient would be at acceptable risk for the planned procedure without further cardiovascular testing.  According to the Revised Cardiac Risk Index (RCRI), her Perioperative Risk of Major Cardiac Event is (%): 0.9. Her Functional Capacity in METs is: 5.07 according to the Duke Activity Status Index (DASI). Advised to address request to hold aspirin with VVS. As noted below, we are getting a coronary CTA for evaluation of chest discomfort but she is at low risk for low risk surgery and may proceed without further testing.   Chest pain: Remains almost constant and is somewhat relieved by belching, flatulence, PPI and antiacids. Symptoms of chest discomfort somewhat uncharacteristic of angina, however she additionally has DOE as noted below. Normal coronary arteries on cath 2009. Multiple risk factors for ASCVD including DM, obesity, long-time tobacco use, and hyperlipidemia. We will get coronary CTA for mapping of coronary anatomy.   DOE: Increased dyspnea with moderate exertion, improves with rest. Likely multifactorial in the setting of obesity, deconditioning, and longtime history of tobacco use.  As noted above, we will get coronary CTA. Has chest CT for lung cancer screening that is pending.  Encouraged 150 minutes of moderate intensity exercise each week, low-sodium diet, and weight loss.  Advised her to notify us if symptoms worsen.  Hypertension: BP is slightly elevated today.  She reports consistently < 130/80 at home, did not take her medication prior to arrival today.   Hyperlipidemia LDL goal <  70/Aortic atherosclerosis: LDL 123 08/2021.  Previously on Praluent, is on sure why she stopped.  Did not tolerate Nexlizet due to leg pain.  We will recheck lipids today.  She is agreeable to restart Praluent if indicated.  Tobacco abuse: Reviewed the benefits of reduction and ultimately complete cessation advised.     Disposition: 6 months with Dr. Acie Fredrickson or APP  Medication Adjustments/Labs and Tests Ordered: Current medicines are reviewed at length with the patient today.  Concerns regarding medicines are outlined above.  Orders Placed This Encounter  Procedures   CT CORONARY MORPH W/CTA COR W/SCORE W/CA W/CM &/OR WO/CM   Comp Met (CMET)   Lipid Profile   EKG 12-Lead   No orders of the defined types were placed in this encounter.   Patient Instructions  Medication Instructions:   Your physician recommends that you continue on your current medications as directed. Please refer to the Current Medication list given to you today.   *If you need a refill on your cardiac medications before your next appointment, please call your pharmacy*   Lab Work:  TODAY!!!! CMET/LIPID  If you have labs (blood work) drawn today and your tests are completely normal, you will receive your results only by: [place] (if you have MyChart) OR A paper copy in the mail If you have any lab test that is abnormal or we need to change your treatment, we will call you to review the results.   Testing/Procedures:    Your cardiac CT will be scheduled at one of the below locations:   [facility] [address] [place], [place] [address] [phone]  If scheduled at [facility], please arrive at the [facility] and Children's Entrance (Entrance C2) of [facility] 30 minutes prior to test start time. You can use the  FREE valet parking offered at entrance C (encouraged to control the heart rate for the test)  Proceed to the [facility] Radiology Department (first  floor) to check-in and test prep.  All radiology patients and guests should use entrance C2 at [facility], accessed from [facility], even though the hospital's physical address listed is [address].    Please follow these instructions carefully (unless otherwise directed):  On the Night Before the Test: Be sure to Drink plenty of water. Do not consume any caffeinated/decaffeinated beverages or chocolate 12 hours prior to your test. Do not take any antihistamines 12 hours prior to your test.  On the Day of the Test: Drink plenty of water until 1 hour prior to the test. Do not eat any food 1 hour prior to test. You may take your regular medications prior to the test.  Take COREG  (CARVEDILOL) ONE AND ONE HALF TABLET (37.5 MG)  two hours prior to test. FEMALES- please wear underwire-free bra if available, avoid dresses & tight clothing   After the Test: Drink plenty of water. After receiving IV contrast, you may experience a mild flushed feeling. This is normal. On occasion, you may experience a mild rash up to 24 hours after the test. This is not dangerous. If this occurs, you can take Benadryl 25 mg and increase your fluid intake. If you experience trouble breathing, this can be serious. If it is severe call 911 IMMEDIATELY. If it is mild, please call our office. We will call to schedule your test 2-4 weeks out understanding that some insurance companies will need an authorization prior to the service being performed.   For non-scheduling related questions, please contact the cardiac imaging nurse navigator should you have any questions/concerns: Marchia Bond, Cardiac Imaging Nurse Navigator Gordy Clement, Cardiac Imaging Nurse Navigator [facility] Direct Office Dial: [phone]   For scheduling needs, including cancellations and rescheduling, please call [place], [phone].    Follow-Up: At [facility], you and your health needs are our priority.  As part of our continuing mission to provide you with exceptional heart care, we have created designated Provider Care Teams.  These Care Teams include your primary Cardiologist (physician) and Advanced Practice Providers (APPs -  Physician Assistants and Nurse Practitioners) who all work together to provide you with the care you need, when you need it.  We recommend signing up for the patient portal called "MyChart".  Sign up information is provided on this After Visit Summary.  MyChart is used to connect with patients for Virtual Visits (Telemedicine).  Patients are able to view lab/test results, encounter notes, upcoming appointments, etc.  Non-urgent messages can be sent to your provider as well.   To learn more about what you can do with MyChart, go to [email or web address].    Your next appointment:   6 month(s)  The format for your next appointment:   In Person  Provider:   Mertie Moores, MD     Important Information About Sugar         Signed, Emmaline Life, NP  11/06/2022 10:09 AM    [place]

## 2022-11-06 ENCOUNTER — Ambulatory Visit: Payer: Managed Care, Other (non HMO) | Attending: Nurse Practitioner | Admitting: Nurse Practitioner

## 2022-11-06 ENCOUNTER — Ambulatory Visit: Payer: Managed Care, Other (non HMO) | Admitting: Internal Medicine

## 2022-11-06 ENCOUNTER — Encounter: Payer: Self-pay | Admitting: Nurse Practitioner

## 2022-11-06 VITALS — BP 138/72 | HR 76 | Ht 65.0 in | Wt 258.0 lb

## 2022-11-06 DIAGNOSIS — I700 Atherosclerosis of aorta: Secondary | ICD-10-CM

## 2022-11-06 DIAGNOSIS — I10 Essential (primary) hypertension: Secondary | ICD-10-CM

## 2022-11-06 DIAGNOSIS — E785 Hyperlipidemia, unspecified: Secondary | ICD-10-CM | POA: Diagnosis not present

## 2022-11-06 DIAGNOSIS — R079 Chest pain, unspecified: Secondary | ICD-10-CM

## 2022-11-06 DIAGNOSIS — R0609 Other forms of dyspnea: Secondary | ICD-10-CM | POA: Diagnosis not present

## 2022-11-06 DIAGNOSIS — Z01810 Encounter for preprocedural cardiovascular examination: Secondary | ICD-10-CM

## 2022-11-06 DIAGNOSIS — F17200 Nicotine dependence, unspecified, uncomplicated: Secondary | ICD-10-CM

## 2022-11-06 LAB — COMPREHENSIVE METABOLIC PANEL
ALT: 17 IU/L (ref 0–32)
AST: 10 IU/L (ref 0–40)
Albumin/Globulin Ratio: 1.2 (ref 1.2–2.2)
Albumin: 4 g/dL (ref 3.8–4.9)
Alkaline Phosphatase: 95 IU/L (ref 44–121)
BUN/Creatinine Ratio: 18 (ref 9–23)
BUN: 20 mg/dL (ref 6–24)
Bilirubin Total: 0.3 mg/dL (ref 0.0–1.2)
CO2: 24 mmol/L (ref 20–29)
Calcium: 10.3 mg/dL — ABNORMAL HIGH (ref 8.7–10.2)
Chloride: 104 mmol/L (ref 96–106)
Creatinine, Ser: 1.13 mg/dL — ABNORMAL HIGH (ref 0.57–1.00)
Globulin, Total: 3.3 g/dL (ref 1.5–4.5)
Glucose: 145 mg/dL — ABNORMAL HIGH (ref 70–99)
Potassium: 4.2 mmol/L (ref 3.5–5.2)
Sodium: 140 mmol/L (ref 134–144)
Total Protein: 7.3 g/dL (ref 6.0–8.5)
eGFR: 56 mL/min/{1.73_m2} — ABNORMAL LOW (ref >59)

## 2022-11-06 LAB — LIPID PANEL
Chol/HDL Ratio: 6.7 ratio — ABNORMAL HIGH (ref 0.0–4.4)
Cholesterol, Total: 208 mg/dL — ABNORMAL HIGH (ref 100–199)
HDL: 31 mg/dL — ABNORMAL LOW (ref >39)
LDL Chol Calc (NIH): 148 mg/dL — ABNORMAL HIGH (ref 0–99)
Triglycerides: 158 mg/dL — ABNORMAL HIGH (ref 0–149)
VLDL Cholesterol Cal: 29 mg/dL (ref 5–40)

## 2022-11-06 NOTE — Patient Instructions (Signed)
Medication Instructions:   Your physician recommends that you continue on your current medications as directed. Please refer to the Current Medication list given to you today.   *If you need a refill on your cardiac medications before your next appointment, please call your pharmacy*   Lab Work:  TODAY!!!! CMET/LIPID  If you have labs (blood work) drawn today and your tests are completely normal, you will receive your results only by: [place] (if you have MyChart) OR A paper copy in the mail If you have any lab test that is abnormal or we need to change your treatment, we will call you to review the results.   Testing/Procedures:    Your cardiac CT will be scheduled at one of the below locations:   [facility] [address] [place], [place] [address] [phone]  If scheduled at [facility], please arrive at the [facility] and Children's Entrance (Entrance C2) of [facility] 30 minutes prior to test start time. You can use the FREE valet parking offered at entrance C (encouraged to control the heart rate for the test)  Proceed to the [facility] Radiology Department (first floor) to check-in and test prep.  All radiology patients and guests should use entrance C2 at [facility], accessed from [facility], even though the hospital's physical address listed is [address]    Please follow these instructions carefully (unless otherwise directed):  On the Night Before the Test: Be sure to Drink plenty of water. Do not consume any caffeinated/decaffeinated beverages or chocolate 12 hours prior to your test. Do not take any antihistamines 12 hours prior to your test.  On the Day of the Test: Drink plenty of water until 1 hour prior to the test. Do not eat any food 1 hour prior to test. You may take your regular medications prior to the test.  Take COREG  (CARVEDILOL) ONE AND ONE HALF TABLET (37.5 MG)  two  hours prior to test. FEMALES- please wear underwire-free bra if available, avoid dresses & tight clothing   After the Test: Drink plenty of water. After receiving IV contrast, you may experience a mild flushed feeling. This is normal. On occasion, you may experience a mild rash up to 24 hours after the test. This is not dangerous. If this occurs, you can take Benadryl 25 mg and increase your fluid intake. If you experience trouble breathing, this can be serious. If it is severe call 911 IMMEDIATELY. If it is mild, please call our office. We will call to schedule your test 2-4 weeks out understanding that some insurance companies will need an authorization prior to the service being performed.   For non-scheduling related questions, please contact the cardiac imaging nurse navigator should you have any questions/concerns: Marchia Bond, Cardiac Imaging Nurse Navigator Gordy Clement, Cardiac Imaging Nurse Navigator [facility] Direct Office Dial: [phone]   For scheduling needs, including cancellations and rescheduling, please call [place], [phone].    Follow-Up: At [facility], you and your health needs are our priority.  As part of our continuing mission to provide you with exceptional heart care, we have created designated Provider Care Teams.  These Care Teams include your primary Cardiologist (physician) and Advanced Practice Providers (APPs -  Physician Assistants and Nurse Practitioners) who all work together to provide you with the care you need, when you need it.  We recommend signing up for the patient portal called "MyChart".  Sign up information is provided on this After Visit Summary.  MyChart is used to connect with patients for Virtual Visits (Telemedicine).  Patients are able to view lab/test results, encounter notes, upcoming appointments, etc.  Non-urgent messages can be sent to your provider as well.   To learn more about what  you can do with MyChart, go to [email or web address].    Your next appointment:   6 month(s)  The format for your next appointment:   In Person  Provider:   Mertie Moores, MD     Important Information About Sugar

## 2022-11-09 ENCOUNTER — Other Ambulatory Visit: Payer: Self-pay | Admitting: *Deleted

## 2022-11-09 ENCOUNTER — Other Ambulatory Visit: Payer: Self-pay | Admitting: Nurse Practitioner

## 2022-11-09 DIAGNOSIS — E785 Hyperlipidemia, unspecified: Secondary | ICD-10-CM

## 2022-11-09 MED ORDER — REPATHA SURECLICK 140 MG/ML ~~LOC~~ SOAJ
1.0000 mL | SUBCUTANEOUS | 3 refills | Status: DC
Start: 2022-11-09 — End: 2022-11-09

## 2022-11-09 MED ORDER — REPATHA SURECLICK 140 MG/ML ~~LOC~~ SOAJ: 1.0000 mL | SUBCUTANEOUS | 3 refills | Status: DC

## 2022-11-17 ENCOUNTER — Ambulatory Visit
Admission: RE | Admit: 2022-11-17 | Discharge: 2022-11-17 | Disposition: A | Discharge status: Home / Self Care | Payer: Managed Care, Other (non HMO) | Source: Ambulatory Visit | Attending: Acute Care | Admitting: Acute Care

## 2022-11-17 DIAGNOSIS — F17210 Nicotine dependence, cigarettes, uncomplicated: Secondary | ICD-10-CM

## 2022-11-17 DIAGNOSIS — Z87891 Personal history of nicotine dependence: Secondary | ICD-10-CM

## 2022-11-20 ENCOUNTER — Other Ambulatory Visit: Payer: Self-pay

## 2022-11-20 ENCOUNTER — Telehealth: Payer: Self-pay | Admitting: Acute Care

## 2022-11-20 DIAGNOSIS — R911 Solitary pulmonary nodule: Secondary | ICD-10-CM

## 2022-11-20 DIAGNOSIS — Z87891 Personal history of nicotine dependence: Secondary | ICD-10-CM

## 2022-11-20 NOTE — Telephone Encounter (Signed)
Attempt to call to review results of LDCT.  No answer.  Left VM and call back information

## 2022-11-20 NOTE — Telephone Encounter (Signed)
Called and spoke to [place] at [facility] on CT scan:  IMPRESSION: 1. Lung-RADS 3, probably benign findings. Short-term follow-up in 6 months is recommended with repeat low-dose chest CT without contrast (please use the following order, "CT CHEST LCS NODULE FOLLOW-UP W/O CM"). New indistinct 5.4 mm apical right upper lobe pulmonary nodule. 2. Dilated main pulmonary artery, suggesting pulmonary arterial hypertension. 3. Aortic Atherosclerosis (ICD10-I70.0) and Emphysema (ICD10-J43.9).

## 2022-11-24 NOTE — Telephone Encounter (Signed)
Letter sent through [facility] for patient to contact us regarding LDCT results.

## 2022-11-26 NOTE — Telephone Encounter (Signed)
See additional encounter from 12/22. Patient was made aware of results. Will close encounter.

## 2022-12-03 ENCOUNTER — Telehealth: Payer: Self-pay | Admitting: Acute Care

## 2022-12-03 NOTE — Telephone Encounter (Signed)
I have called the patient with the results of her low-dose screening CT.  I explained that her scan was read as a lung RADS 3 and requires a 32-monthfollow-up CT scan.  There is a new indistinct 5.4 mm apical right upper lobe pulmonary nodule.  She is in agreement with a 657-monthollow-up.  Scan will be due June 2024 and she understands we will call closer to the time to get her scheduled for her scan. There was also notation of a dilated main pulmonary artery suggestive of pulmonary artery hypertension, aortic atherosclerosis and emphysema. Patient states that she is followed by cardiology, she has been encouraged to work on smoking cessation. Denise please place order for 6-44-monthllow-up low-dose screening CT and fax results to PCP let him know plan is for a 6-m59-monthlow-up.  Thanks so much

## 2022-12-03 NOTE — Telephone Encounter (Signed)
Results/plan faxed to PCP and order placed for LCS LDCT nodule follow up for June 2024

## 2022-12-09 NOTE — Progress Notes (Signed)
Patient called requesting status of her clearance forms. I lvm stating the forms were faxed yesterday and confirmation received.

## 2022-12-09 NOTE — Progress Notes (Signed)
Fax received for medical clearance/medication hold for Dr. Virl Cagey.  Provider signed, form faxed back to sender, verified successful, sent to scan center.

## 2022-12-13 ENCOUNTER — Other Ambulatory Visit: Payer: Self-pay | Admitting: Family Medicine

## 2022-12-13 ENCOUNTER — Other Ambulatory Visit: Payer: Self-pay | Admitting: Gastroenterology

## 2022-12-14 ENCOUNTER — Telehealth: Payer: Self-pay | Admitting: *Deleted

## 2022-12-14 NOTE — Telephone Encounter (Signed)
Pt called in and wanted to review lung screening CT results again. Advised pt that there was a new small nodule seen on the scan that we will look at again in 6 months with a repeat CT scan. Mild emphysema seen related to past smoking history. Mild coronary calcifications seen. PT verbalized understanding and is aware we will call her closer to 6 month to schedule follow up CT.

## 2022-12-15 ENCOUNTER — Telehealth: Payer: Self-pay

## 2022-12-15 NOTE — Telephone Encounter (Signed)
Pt called inquiring if the surgical clearance form had been sent back to Dr. Madelynn Done office.  Reviewed pt's chart, two identifiers used. Informed her that it was signed and faxed back on 12/08/21. Pt said she would call their office. Confirmed understanding.

## 2022-12-25 ENCOUNTER — Other Ambulatory Visit (HOSPITAL_COMMUNITY): Payer: Self-pay | Admitting: *Deleted

## 2022-12-25 ENCOUNTER — Telehealth (HOSPITAL_COMMUNITY): Payer: Self-pay | Admitting: *Deleted

## 2022-12-25 DIAGNOSIS — Z01812 Encounter for preprocedural laboratory examination: Secondary | ICD-10-CM

## 2022-12-25 NOTE — Telephone Encounter (Signed)
Attempted to call patient regarding upcoming cardiac CT appointment. Left message on voicemail with name and callback number  Gordy Clement RN Navigator Cardiac [facility] [phone] Office [phone] Cell  Placed order to update BMET prior to appt.

## 2022-12-28 ENCOUNTER — Ambulatory Visit: Payer: Managed Care, Other (non HMO) | Attending: Nurse Practitioner

## 2022-12-28 ENCOUNTER — Telehealth (HOSPITAL_COMMUNITY): Payer: Self-pay | Admitting: Emergency Medicine

## 2022-12-28 NOTE — Telephone Encounter (Signed)
Reaching out to patient to offer assistance regarding upcoming cardiac imaging study; pt verbalizes understanding of appt date/time, parking situation and where to check in, pre-test NPO status and medications ordered, and verified current allergies; name and call back number provided for further questions should they arise Marchia Bond RN Navigator Cardiac Imaging [facility] [phone] office [phone] cell   Arrival 300 WC entrance 37.'5mg'$  coreg 2 hr prior  Denies iv issues Getting labs today

## 2022-12-29 ENCOUNTER — Ambulatory Visit (HOSPITAL_COMMUNITY)
Admission: RE | Admit: 2022-12-29 | Discharge: 2022-12-29 | Disposition: A | Discharge status: Home / Self Care | Payer: Managed Care, Other (non HMO) | Source: Ambulatory Visit | Attending: Nurse Practitioner | Admitting: Nurse Practitioner

## 2022-12-29 DIAGNOSIS — R079 Chest pain, unspecified: Secondary | ICD-10-CM | POA: Diagnosis present

## 2022-12-29 LAB — BASIC METABOLIC PANEL
BUN/Creatinine Ratio: 20 (ref 9–23)
BUN: 19 mg/dL (ref 6–24)
CO2: 21 mmol/L (ref 20–29)
Calcium: 9.7 mg/dL (ref 8.7–10.2)
Chloride: 105 mmol/L (ref 96–106)
Creatinine, Ser: 0.95 mg/dL (ref 0.57–1.00)
Glucose: 225 mg/dL — ABNORMAL HIGH (ref 70–99)
Potassium: 3.9 mmol/L (ref 3.5–5.2)
Sodium: 141 mmol/L (ref 134–144)
eGFR: 69 mL/min/{1.73_m2} (ref >59)

## 2022-12-29 MED ORDER — IOHEXOL 350 MG/ML SOLN
100.0000 mL | Freq: Once | INTRAVENOUS | Status: AC | PRN
  Administered 2022-12-29: 100 mL via INTRAVENOUS

## 2022-12-29 MED ORDER — NITROGLYCERIN 0.4 MG SL SUBL
0.8000 mg | SUBLINGUAL_TABLET | Freq: Once | SUBLINGUAL | Status: AC
  Administered 2022-12-29: 0.8 mg via SUBLINGUAL

## 2022-12-29 MED ORDER — NITROGLYCERIN 0.4 MG SL SUBL
SUBLINGUAL_TABLET | SUBLINGUAL | Status: AC
  Filled 2022-12-29: qty 2

## 2022-12-31 ENCOUNTER — Telehealth: Payer: Self-pay | Admitting: Family Medicine

## 2022-12-31 NOTE — Telephone Encounter (Signed)
She saw pulmonary for this in 2022-since she is already established should simply be able to call for a follow-up visit and to ask for guidance on getting set up for CPAP

## 2022-12-31 NOTE — Telephone Encounter (Signed)
Ok to refer to pulmonology for this?

## 2022-12-31 NOTE — Telephone Encounter (Signed)
Patient states: - She has had CPAP since 2012 but the motor is running out   Patient requests either a sleep study be ordered so she can get a new one or just a new order for a CPAP be put in.

## 2023-01-01 NOTE — Telephone Encounter (Signed)
Called and spoke with pt and below message given. 

## 2023-01-04 ENCOUNTER — Other Ambulatory Visit: Payer: Self-pay | Admitting: Cardiovascular Disease

## 2023-01-07 ENCOUNTER — Encounter: Payer: Self-pay | Admitting: Family

## 2023-01-07 ENCOUNTER — Ambulatory Visit: Payer: Managed Care, Other (non HMO) | Admitting: Family

## 2023-01-07 VITALS — BP 128/72 | HR 85 | Temp 97.8°F | Ht 65.0 in | Wt 259.8 lb

## 2023-01-07 DIAGNOSIS — M10472 Other secondary gout, left ankle and foot: Secondary | ICD-10-CM

## 2023-01-07 MED ORDER — COLCHICINE 0.6 MG PO CAPS: ORAL_CAPSULE | ORAL | 0 refills | Status: DC

## 2023-01-07 NOTE — Patient Instructions (Addendum)
It was very nice to see you today!   I have sent over Colchicine to take for your gout flare. Do not take any Ibuprofen, Motrin while taking this.  Increase your water intake to 64oz = 8 cups daily.  Avoid all meat if possible for next few days to recover faster.  See the handout attached for food choices.     PLEASE NOTE:  If you had any lab tests please let us know if you have not heard back within a few days. You may see your results on MyChart before we have a chance to review them but we will give you a call once they are reviewed by [name]. If we ordered any referrals today, please let us know if you have not heard from their office within the next week.

## 2023-01-07 NOTE — Progress Notes (Signed)
Patient ID: Lisa [name], female    DOB: [birth year], 62 y.o.   MRN: [id number]  Chief Complaint  Patient presents with   Foot Swelling    Pt c/o pain in left foot since Tuesday night. Has tried Elevating, motrin which did not help.Swelling, Sore, warm to touch and painful to walk on.    HPI:      Gout flare:  left foot, started Tuesday and she tried tart cherry tablets which normally work for her but not this time, also took Motrin, applied ice but nothing is helping her pain. Reports her last gout episode was about 2 years ago. She ate red meat at 2 different meals on Sunday which she thinks brought on this episode. Denies drinking any alcohol or eating shellfish.  Assessment & Plan:  1. Other secondary acute gout of left foot - sending Colchicine, interaction w/Coreg, so reducing dose and frequency, advised on use & SE, drink more water, avoid purine rich foods, avoid all meat for next few days.  - Colchicine 0.6 MG CAPS; Take 2 caps today, then 1 cap daily for 4 days  Dispense: 6 capsule; Refill: 0   Subjective:    Outpatient Medications Prior to Visit  Medication Sig Dispense Refill   ASPIRIN 81 PO Take 81 mg by mouth daily.     buPROPion (WELLBUTRIN XL) 150 MG 24 hr tablet Take 1 tablet (150 mg total) by mouth daily. 90 tablet 3   carvedilol (COREG) 25 MG tablet TAKE 1 TABLET BY MOUTH TWICE A DAY 180 tablet 3   dicyclomine (BENTYL) 10 MG capsule TAKE 1 CAPSULE (10 MG TOTAL) BY MOUTH 3 (THREE) TIMES DAILY BEFORE MEALS. AS NEEDED 270 capsule 3   Dulaglutide (TRULICITY) 8.41 YS/0.6TK SOPN INJECT 0.75 MG SUBCUTANEOUSLY ONE TIME PER WEEK 6 mL 1   Evolocumab (REPATHA SURECLICK) 160 MG/ML SOAJ Inject 140 mg into the skin every 14 (fourteen) days. 6 mL 3   ONE TOUCH LANCETS MISC Use to check blood sugars twice a day 200 each 0   pantoprazole (PROTONIX) 40 MG tablet TAKE 1 TABLET BY MOUTH 30 MINUTES BEFORE BREAKFAST DAILY 90 tablet 1   telmisartan (MICARDIS) 40 MG tablet TAKE 1 TABLET  BY MOUTH EVERY DAY 90 tablet 3   No facility-administered medications prior to visit.   Past Medical History:  Diagnosis Date   Allergic rhinitis    Arthritis    Asthma    Breast nodule    right breast, to see dr Helane Rima 06-20-2012 for    Chest pain, non-cardiac    Chronic headaches    Cigarette smoker    Complication of anesthesia 05-17-2012   trouble breathing after colonscopy, needed nebulizer   COPD (chronic obstructive pulmonary disease) ([place])    Diastolic congestive heart failure ([place]) 04/17/2019   Diverticulosis    GERD (gastroesophageal reflux disease)    Hepatic hemangioma    Hypertension    IBS (irritable bowel syndrome)    Nuclear stress test    Myoview 05/2019: EF 63, no ischemia. Low Risk   Obesity    Prosthetic eye globe    Pyloric erosion    Sleep apnea    CPAP setting varies from 4-10   Type 2 diabetes mellitus ([place]) 03/11/2017   Past Surgical History:  Procedure Laterality Date   ABDOMINAL AORTOGRAM W/LOWER EXTREMITY N/A 04/30/2017   Procedure: Abdominal Aortogram w/Lower Extremity;  Surgeon: Elam Dutch, MD;  Location: [facility];  Service: Cardiovascular;  Laterality: N/A;  CARDIAC CATHETERIZATION  12/28/2007   EF 75%. IT REVEALS NORMAL/SUPRANORMAL LEFT VENTRICULAR SYSTOLIC FUNCTION   CARDIOVASCULAR STRESS TEST  03/25/2007   EF 78%   CESAREAN SECTION  2003, 2009   x 2   CHOLECYSTECTOMY  1993   COLONOSCOPY  2014   ENDOMETRIAL ABLATION  09/2010   and D&C   HERNIA REPAIR  1610   umbilical   LAPAROSCOPY     PERIPHERAL VASCULAR BALLOON ANGIOPLASTY Left 04/30/2017   Procedure: Peripheral Vascular Balloon Angioplasty;  Surgeon: Elam Dutch, MD;  Location: [facility];  Service: Cardiovascular;  Laterality: Left;  SFA   RECONSTRUCTION OF EYELID Bilateral    TUBAL LIGATION     UMBILICAL HERNIA REPAIR  2004   US ECHOCARDIOGRAPHY  05/08/2008   EF 55-60%   VENTRAL HERNIA REPAIR  06/15/2012   Procedure: LAPAROSCOPIC VENTRAL HERNIA;  Surgeon:  Edward Jolly, MD;  Location: [facility];  Service: General;  Laterality: N/A;  LAPAROSCOPIC REPAIR VENTRAL HERNIA   VIDEO BRONCHOSCOPY Bilateral 11/15/2017   Procedure: VIDEO BRONCHOSCOPY WITH FLUORO;  Surgeon: Collene Gobble, MD;  Location: [facility];  Service: Cardiopulmonary;  Laterality: Bilateral;   Allergies  Allergen Reactions   Almond Oil Anaphylaxis and Nausea And Vomiting    Almond-Nuts Only   Apple Juice Anaphylaxis   Ceftin [Cefuroxime] Other (See Comments)    Severe stomach pain - Diverticulitis flares up   Cherry Extract Anaphylaxis    Cherry fruit only    Other Anaphylaxis and Itching    Most fruits- Are NOT tolerated (PLEASE ASK PATIENT BEFORE GIVING, AS CERTAIN FRUITS ARE TOLERATED IN LIMITED QUANTITIES!!)   Guaifenesin & Derivatives Other (See Comments)    Restlessness, jittery   Hctz [Hydrochlorothiazide] Other (See Comments)    Causes a headache   Naproxen Other (See Comments)    Insomnia   Lisinopril Cough           Objective:    Physical Exam Vitals and nursing note reviewed.  Constitutional:      Appearance: Normal appearance.  Cardiovascular:     Rate and Rhythm: Normal rate and regular rhythm.  Pulmonary:     Effort: Pulmonary effort is normal.     Breath sounds: Normal breath sounds.  Musculoskeletal:        General: Normal range of motion.  Feet:     Left foot:     Skin integrity: Erythema (with 3+ edema in foot and ankle) and warmth present.  Skin:    General: Skin is warm and dry.  Neurological:     Mental Status: She is alert.  Psychiatric:        Mood and Affect: Mood normal.        Behavior: Behavior normal.   BP 128/72 (BP Location: Left Arm, Patient Position: Sitting, Cuff Size: Large)   Pulse 85   Temp 97.8 F (36.6 C) (Temporal)   Ht '5\' 5"'$  (1.651 m)   Wt 259 lb 12.8 oz (117.8 kg)   SpO2 97%   BMI 43.23 kg/m  Wt Readings from Last 3 Encounters:  01/07/23 259 lb 12.8 oz (117.8 kg)  11/06/22 258 lb (117 kg)   10/07/22 254 lb (115.2 kg)      Jeanie Sewer, NP

## 2023-02-09 ENCOUNTER — Encounter: Payer: Self-pay | Admitting: Family

## 2023-02-11 NOTE — Telephone Encounter (Signed)
Deja - I am not her PCP - even though I saw her for the problem the first time, she has to go thru her PCP - can you forward the message to him please.

## 2023-02-15 ENCOUNTER — Ambulatory Visit (INDEPENDENT_AMBULATORY_CARE_PROVIDER_SITE_OTHER): Payer: Managed Care, Other (non HMO) | Admitting: Family Medicine

## 2023-02-15 ENCOUNTER — Encounter: Payer: Self-pay | Admitting: Family Medicine

## 2023-02-15 VITALS — BP 138/78 | HR 81 | Temp 98.0°F | Ht 65.0 in | Wt 251.2 lb

## 2023-02-15 DIAGNOSIS — R1032 Left lower quadrant pain: Secondary | ICD-10-CM | POA: Diagnosis not present

## 2023-02-15 DIAGNOSIS — E1165 Type 2 diabetes mellitus with hyperglycemia: Secondary | ICD-10-CM

## 2023-02-15 DIAGNOSIS — Z23 Encounter for immunization: Secondary | ICD-10-CM

## 2023-02-15 DIAGNOSIS — N1831 Chronic kidney disease, stage 3a: Secondary | ICD-10-CM

## 2023-02-15 DIAGNOSIS — E1122 Type 2 diabetes mellitus with diabetic chronic kidney disease: Secondary | ICD-10-CM

## 2023-02-15 DIAGNOSIS — I10 Essential (primary) hypertension: Secondary | ICD-10-CM | POA: Diagnosis not present

## 2023-02-15 NOTE — Patient Instructions (Addendum)
Have a copy of your diabetic eye exam faxed to [name] at [phone] next month.  Schedule labs tomorrow morning  We will try to get CT set up tomorrow  Worsening symptoms- please seek care overnight in emergency room  Continue softer diet- liquid diet may help give some relief  Recommended follow up: Return for as needed for new, worsening, persistent symptoms.  -need to get in for a regular follow up as well in next several months

## 2023-02-15 NOTE — Progress Notes (Signed)
Phone [phone] In person visit   Subjective:   Lisa [name] is a 62 y.o. year old very pleasant female patient who presents for/with See problem oriented charting Chief Complaint  Patient presents with   Abdominal Pain    Pt c/o diverticulitis flare up (see mychart message)    Past Medical History-  Patient Active Problem List   Diagnosis Date Noted   Type 2 diabetes mellitus with hyperglycemia, without long-term current use of insulin ([place]) 10/03/2021    Priority: High   Coronary artery calcification 04/20/2019    Priority: High   Diastolic congestive heart failure ([place]) 04/17/2019    Priority: High   PAD (peripheral artery disease) ([place]) 01/19/2017    Priority: High   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    Priority: Medium    CKD (chronic kidney disease), stage III ([place]) 09/18/2020    Priority: Medium    Fatty liver 11/06/2019    Priority: Medium    Hyperlipidemia 04/12/2018    Priority: Medium    Hyperlipidemia associated with type 2 diabetes mellitus ([place]) 09/06/2017    Priority: Medium    Breast mass 08/03/2017    Priority: Medium    GERD (gastroesophageal reflux disease) 02/14/2016    Priority: Medium    DOE (dyspnea on exertion) 03/08/2014    Priority: Medium    Migraine without aura 07/26/2013    Priority: Medium    Sleep apnea 02/11/2012    Priority: Medium    Chest discomfort 10/12/2011    Priority: Medium    Essential hypertension 10/12/2011    Priority: Medium    Chronic rhinitis 06/26/2019    Priority: Low   Trigger finger, right middle finger  05/04/2019    Priority: Low   Bilateral leg pain 04/14/2018    Priority: Low   S/P bronchoscopy with biopsy     Priority: Low   Chronic hip pain 07/29/2017    Priority: Low   Left knee pain 05/15/2016    Priority: Low   Flank pain 04/21/2016    Priority: Low   Abdominal pain 02/28/2016    Priority: Low   Pulmonary nodule, right 04/11/2015    Priority: Low   Mediastinal lymphadenopathy 01/18/2015    Priority: Low   Multinodular goiter 06/10/2022   Type 2 diabetes mellitus with stage 3a chronic kidney disease, without long-term current use of insulin ([place]) 06/10/2022    Medications- reviewed and updated Current Outpatient Medications  Medication Sig Dispense Refill   ASPIRIN 81 PO Take 81 mg by mouth daily.     buPROPion (WELLBUTRIN XL) 150 MG 24 hr tablet Take 1 tablet (150 mg total) by mouth daily. 90 tablet 3   carvedilol (COREG) 25 MG tablet TAKE 1 TABLET BY MOUTH TWICE A DAY 180 tablet 3   Colchicine 0.6 MG CAPS Take 2 caps today, then 1 cap daily for 4 days 6 capsule 0   dicyclomine (BENTYL) 10 MG capsule TAKE 1 CAPSULE (10 MG TOTAL) BY MOUTH 3 (THREE) TIMES DAILY BEFORE MEALS. AS NEEDED 270 capsule 3   Dulaglutide (  TRULICITY) A999333 0000000 SOPN INJECT 0.75 MG SUBCUTANEOUSLY ONE TIME PER WEEK 6 mL 1   Evolocumab (REPATHA SURECLICK) XX123456 MG/ML SOAJ Inject 140 mg into the skin every 14 (fourteen) days. 6 mL 3   ONE TOUCH LANCETS MISC Use to check blood sugars twice a day 200 each 0   pantoprazole (PROTONIX) 40 MG tablet TAKE 1 TABLET BY MOUTH 30 MINUTES BEFORE BREAKFAST DAILY 90 tablet 1   telmisartan (MICARDIS) 40 MG tablet TAKE 1 TABLET BY MOUTH EVERY DAY 90 tablet 3   No current facility-administered medications for this visit.     Objective:  BP 138/78   Pulse 81   Temp 98 F (36.7 C)   Ht 5\' 5"  (1.651 m)   Wt 251 lb 3.2 oz (113.9 kg)   SpO2 93%   BMI 41.80 kg/m  Gen: NAD, resting comfortably CV: RRR no murmurs rubs or gallops Lungs: CTAB no crackles, wheeze,  rhonchi Abdomen: soft/nontender except with deep palpation in left lower quadrant/nondistended/normal bowel sounds. No rebound or guarding.  Ext: Trace edema Skin: warm, dry     Assessment and Plan   # Left lower quadrant S:patient woke up with abdominal pain 3:19 AM this morning. Took milk of magnesia and had bowel movement with pain reduction down to 7/10 from peak of 9/10. Took motrin for pain and opted to rest- basically sleeping all day and that did help some- at moment 4.5-10  best its been today.  She has stopped eating as a result- other than rice, gravy, small volume [place].  Still targetting 64 oz water today per her normal.   Recent gout flare treated with colchicine   Some blood this morning- hemorrhoids she believed bleeding. Advised caution A/P: Patient with history of diverticulitis on several occasions over the last few years and we have even consider consult with general surgery-presenting with left lower quadrant pain concerning for diverticulitis recurrence.  We are going to get a CT to confirm-for now continue conservative care-liquid diet reasonable but if feels can tolerate other foods can certainly try-reviewed newer information from up-to-date on lack of need for antibiotics as initial treatment as well as more flexibility with food intake - Also update labs tomorrow morning to look for other potential causes including urinalysis and urine culture for potential UTI though doubt -Does have history of IBS like symptoms with abdominal cramping and has Bentyl on hand-previously provided by Dr. Fuller Plan but currently provided by me.  Do not strongly suspect IBS as cause of symptoms  #hypertension S: medication: Telmisartan 40 mg BP Readings from Last 3 Encounters:  02/15/23 138/78  01/07/23 128/72  12/29/22 128/65  A/P: High acceptable blood pressure-reports has not even taken blood pressure medicine yet today so suspect will be better controlled  # Diabetes S: Medication:  Trulicity [id number] mg - Patient has been followed by Dr. Kelton Pillar but has been lost to care Lab Results  Component Value Date   HGBA1C 8.6 (H) 04/24/2022   HGBA1C 8.2 (H) 01/15/2022   HGBA1C 9.5 (H) 09/08/2021  A/P: Our lab was not available at our time of visit-she agrees to come back tomorrow to have this checked-for now continue current medication though could consider holding with diverticulitis-thankfully she is able to keep food down without difficulty  Recommended follow up: Return for as needed for new, worsening, persistent symptoms. Future Appointments  Date Time Provider [place]  02/16/2023 10:45 AM [facility]  05/12/2023  4:00 PM Nahser, Wonda Cheng, MD [facility]    Lab/Order associations:   ICD-10-CM   1. Left lower quadrant abdominal pain  R10.32 CT Abdomen Pelvis W Contrast    2. LLQ pain  R10.32 Comprehensive metabolic panel    CBC with Differential/Platelet    Urinalysis, Routine w reflex microscopic    Urine Culture    3. Type 2 diabetes mellitus with hyperglycemia, without long-term current use of insulin (H[facility])  E11.65 Comprehensive metabolic panel    CBC with Differential/Platelet    HgB A1c    Microalbumin / creatinine urine ratio    4. Type 2 diabetes mellitus with stage 3a chronic kidney disease, without long-term current use of insulin (H[facility]) Chronic E11.22    N18.31     5. Need for Tdap vaccination  Z23 Tdap vaccine greater than or equal to 7yo IM    6. Essential hypertension  I10       No orders of the defined types were placed in this encounter.   Return precautions advised.  Garret Reddish, MD

## 2023-02-15 NOTE — Telephone Encounter (Signed)
Patient called back stating she didn't see message due to being sleep. Requests to be squeezed in this afternoon if possible.

## 2023-02-15 NOTE — Telephone Encounter (Signed)
Have pt come in at 4:00, let her know that with this being a work in they will have to stay on topic and she will have to come back tomorrow for labs since lab will more than likely be closed when he gets to her.

## 2023-02-16 ENCOUNTER — Other Ambulatory Visit (INDEPENDENT_AMBULATORY_CARE_PROVIDER_SITE_OTHER): Payer: Managed Care, Other (non HMO)

## 2023-02-16 DIAGNOSIS — R1032 Left lower quadrant pain: Secondary | ICD-10-CM

## 2023-02-16 DIAGNOSIS — E1165 Type 2 diabetes mellitus with hyperglycemia: Secondary | ICD-10-CM | POA: Diagnosis not present

## 2023-02-16 LAB — URINALYSIS, ROUTINE W REFLEX MICROSCOPIC
Bilirubin Urine: NEGATIVE
Ketones, ur: NEGATIVE
Leukocytes,Ua: NEGATIVE
Nitrite: NEGATIVE
Specific Gravity, Urine: 1.025 (ref 1.000–1.030)
Total Protein, Urine: NEGATIVE
Urine Glucose: NEGATIVE
Urobilinogen, UA: 0.2 (ref 0.0–1.0)
WBC, UA: NONE SEEN (ref 0–?)
pH: 5.5 (ref 5.0–8.0)

## 2023-02-16 LAB — COMPREHENSIVE METABOLIC PANEL
ALT: 15 U/L (ref 0–35)
AST: 12 U/L (ref 0–37)
Albumin: 4 g/dL (ref 3.5–5.2)
Alkaline Phosphatase: 90 U/L (ref 39–117)
BUN: 27 mg/dL — ABNORMAL HIGH (ref 6–23)
CO2: 23 mEq/L (ref 19–32)
Calcium: 10.1 mg/dL (ref 8.4–10.5)
Chloride: 103 mEq/L (ref 96–112)
Creatinine, Ser: 1.16 mg/dL (ref 0.40–1.20)
GFR: 51.63 mL/min — ABNORMAL LOW (ref >60.00)
Glucose, Bld: 121 mg/dL — ABNORMAL HIGH (ref 70–99)
Potassium: 4.7 mEq/L (ref 3.5–5.1)
Sodium: 136 mEq/L (ref 135–145)
Total Bilirubin: 0.5 mg/dL (ref 0.2–1.2)
Total Protein: 7.7 g/dL (ref 6.0–8.3)

## 2023-02-16 LAB — MICROALBUMIN / CREATININE URINE RATIO
Creatinine,U: 114.5 mg/dL
Microalb Creat Ratio: 2.5 mg/g (ref 0.0–30.0)
Microalb, Ur: 2.8 mg/dL — ABNORMAL HIGH (ref 0.0–1.9)

## 2023-02-16 LAB — HEMOGLOBIN A1C: Hgb A1c MFr Bld: 7.3 % — ABNORMAL HIGH (ref 4.6–6.5)

## 2023-02-17 LAB — URINE CULTURE
MICRO NUMBER:: 14711686
Result:: NO GROWTH
SPECIMEN QUALITY:: ADEQUATE

## 2023-02-18 ENCOUNTER — Ambulatory Visit (HOSPITAL_COMMUNITY): Admission: RE | Admit: 2023-02-18 | Payer: Managed Care, Other (non HMO) | Source: Ambulatory Visit

## 2023-03-04 ENCOUNTER — Ambulatory Visit: Payer: Managed Care, Other (non HMO) | Admitting: Family Medicine

## 2023-03-04 ENCOUNTER — Encounter: Payer: Self-pay | Admitting: Emergency Medicine

## 2023-03-04 ENCOUNTER — Ambulatory Visit: Payer: Managed Care, Other (non HMO) | Admitting: Emergency Medicine

## 2023-03-04 VITALS — BP 124/82 | HR 81 | Ht 65.5 in | Wt 263.0 lb

## 2023-03-04 VITALS — BP 124/82 | HR 81 | Temp 98.7°F | Ht 65.5 in | Wt 262.6 lb

## 2023-03-04 DIAGNOSIS — J449 Chronic obstructive pulmonary disease, unspecified: Secondary | ICD-10-CM

## 2023-03-04 DIAGNOSIS — J84115 Respiratory bronchiolitis interstitial lung disease: Secondary | ICD-10-CM | POA: Diagnosis not present

## 2023-03-04 DIAGNOSIS — G4730 Sleep apnea, unspecified: Secondary | ICD-10-CM | POA: Diagnosis not present

## 2023-03-04 DIAGNOSIS — M10472 Other secondary gout, left ankle and foot: Secondary | ICD-10-CM

## 2023-03-04 DIAGNOSIS — M1A4720 Other secondary chronic gout, left ankle and foot, without tophus (tophi): Secondary | ICD-10-CM | POA: Diagnosis not present

## 2023-03-04 DIAGNOSIS — M1A0710 Idiopathic chronic gout, right ankle and foot, without tophus (tophi): Secondary | ICD-10-CM

## 2023-03-04 DIAGNOSIS — F17200 Nicotine dependence, unspecified, uncomplicated: Secondary | ICD-10-CM

## 2023-03-04 LAB — URIC ACID: Uric Acid, Serum: 8.5 mg/dL — ABNORMAL HIGH (ref 2.4–7.0)

## 2023-03-04 MED ORDER — COLCHICINE 0.6 MG PO CAPS: 1.0000 | ORAL_CAPSULE | Freq: Every day | ORAL | 2 refills | Status: DC | PRN

## 2023-03-04 NOTE — Assessment & Plan Note (Signed)
Her symptoms or not very interested.  We talked about smoking cessation.  She will keep her albuterol available to use if needed.  No clear indication to put her on scheduled BD therapy at this time

## 2023-03-04 NOTE — Addendum Note (Signed)
Addended by: Gavin Potters R on: 03/04/2023 09:48 AM   Modules accepted: Orders

## 2023-03-04 NOTE — Assessment & Plan Note (Signed)
Currently on CPAP 10 cmH2O with a ramp up (she was originally 14 cmH2O but the pressure was too high).  Her equipment is old and apparently she does not get replacements anymore from apnea.  Her machine is old as well.  She wants to change companies.  I will arrange for home sleep study and then we will place orders for a new device with a different company.  We will plan to order AutoSet CPAP 5-15 cmH2O

## 2023-03-04 NOTE — Progress Notes (Signed)
   Rubin Payor, PhD, LAT, ATC acting as a scribe for [name], MD.  Lisa [name] is a 62 y.o. female who presents to [facility] at [facility] today to discuss surgical referral. 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 Pt was last seen by Dr. Denyse Amass on 10/07/22 for L trigger thumb and was referred to Dr. Frazier Butt for surgical consultation. Today, pt reports she was supposed be scheduled for surgery in Dec, but there was confusion and mix-up at [facility].  She is trying to get her trigger thumb scheduled.  However due to miscommunication between [facility] and her cardiothoracic's office about how long to stop the aspirin for.  Additionally she has a history of gout.  She has been prescribed colchicine but having some flareups in her toes.  She does not have a uric acid lowering medication.  Pertinent review of systems: No fevers or chills  Relevant historical information: Coronary artery disease   Exam:  BP 124/82   Pulse 81   Ht 5' 5.5" (1.664 m)   Wt 263 lb (119.3 kg)   SpO2 98%   BMI 43.10 kg/m  General: Well Developed, well nourished, and in no acute distress.   MSK: Left thumb: Triggering still present with lack of range of motion of IP joint.  Toes bilaterally tender palpation first MTP.    Lab and Radiology Results Results for orders placed or performed in visit on 03/04/23 (from the past 72 hour(s))  Uric acid     Status: Abnormal   Collection Time: 03/04/23  3:52 PM  Result Value Ref Range   Uric Acid, Serum 8.5 (H) 2.4 - 7.0 mg/dL   No results found.     Assessment and Plan: 62 y.o. female with trigger thumb: Will benefit from surgery.  I discussed with Dr. Frazier Butt and I heard back before this note was written that his office was waiting on cardiac clearance and he is going to make sure his office reaches out to talk to [place] on April 5 about scheduling surgery.  Apparently there was some  significant communication issues.  Chronic gout: Not controlled.  Uric acid elevated at 8.5.  Will start allopurinol.  Continue colchicine.  Will take it prophylactically daily for now.  Recheck uric acid in about a month.   PDMP not reviewed this encounter. Orders Placed This Encounter  Procedures   Uric acid    Standing Status:   Future    Number of Occurrences:   1    Standing Expiration Date:   03/03/2024   Meds ordered this encounter  Medications   Colchicine 0.6 MG CAPS    Sig: Take 1 capsule (0.6 mg total) by mouth daily as needed.    Dispense:  30 capsule    Refill:  2     Discussed warning signs or symptoms. Please see discharge instructions. Patient expresses understanding.   The above documentation has been reviewed and is accurate and complete [name], M.D.

## 2023-03-04 NOTE — Assessment & Plan Note (Signed)
Discussed cessation with her today.  We set a goal to get down to 15 cigarettes daily by the next visit.  We will continue to cut down after that.  She has a lung cancer screening CT chest to be done in June to follow a new right apical nodule.  Her other nodules were stable.

## 2023-03-04 NOTE — Progress Notes (Signed)
Subjective:    Patient ID: Lisa [name], female    DOB: Dec 28, 1960, 62 y.o.   MRN: [id number]  Shortness of Breath Pertinent negatives include no ear pain, fever, headaches, leg swelling, rash, rhinorrhea, sore throat, vomiting or wheezing.   HPI:   ROV 03/04/23 --follow-up visit 62 year old woman with a history of tobacco use and associated COPD, RB-ILD.  I follow her for this, chronic cough and obstructive sleep apnea for which she has been on CPAP.  Her last lung cancer screening CT was 11/17/2022 as below.  She also had a coronary morphology CT done 12/29/2022 as below.  Today she reports that she is wearing her CPAP reliably but there is leak - she is not getting new equipment from [facility], her machine is over 63 yrs old. She still gets clinical benefit, sleeps better, no HA. Having allergy sx for the last few weeks w some cough. She has albuterol but uses very rarely. She can get some dyspnea with longer walks. She is having some daily GERD. No flares.  She is not currently on scheduled bronchodilator therapy. She is smoking ~1 pk/day.   Lung cancer screening CT chest 11/17/2022 reviewed by me shows hypodense left thyroid nodule 3 cm, no mediastinal or hilar adenopathy, centrilobular emphysematous change and bronchial wall thickening, stable previously identified pulmonary nodules, new 5.4 mm nodule at the right apex.  RADS 3 study, repeat in 6 months recommended.  CT coronary morphology 12/29/2022 reviewed by me shows no extracardiac abnormality (does not go to the right apex)  Review of Systems  Constitutional:  Negative for fever and unexpected weight change.  HENT:  Negative for congestion, dental problem, ear pain, nosebleeds, postnasal drip, rhinorrhea, sinus pressure, sneezing, sore throat and trouble swallowing.   Eyes:  Negative for redness and itching.  Respiratory:  Negative for cough, chest tightness, shortness of breath and wheezing.   Cardiovascular:  Negative for  palpitations and leg swelling.  Gastrointestinal:  Negative for nausea and vomiting.  Genitourinary:  Negative for dysuria.  Musculoskeletal:  Negative for joint swelling.  Skin:  Negative for rash.  Neurological:  Negative for headaches.  Hematological:  Does not bruise/bleed easily.  Psychiatric/Behavioral:  Negative for dysphoric mood. The patient is not nervous/anxious.        Objective:   Physical Exam Vitals:   03/04/23 0858  BP: 124/82  Pulse: 81  Temp: 98.7 F (37.1 C)  TempSrc: Oral  SpO2: 98%  Weight: 262 lb 9.6 oz (119.1 kg)  Height: 5' 5.5" (1.664 m)   Gen: Pleasant, obese, in no distress,  normal affect  ENT: No lesions,  mouth clear, chronic L ptosis  Neck: No JVD, no stridor  Lungs: No use of accessory muscles, clear without rales or rhonchi  Cardiovascular: RRR, heart sounds normal, no murmur or gallops, trace peripheral edema  Musculoskeletal: No deformities, no cyanosis or clubbing  Neuro: alert, non focal  Skin: Warm, no lesions or rashes       Assessment & Plan:  Sleep apnea Currently on CPAP 10 cmH2O with a ramp up (she was originally 14 cmH2O but the pressure was too high).  Her equipment is old and apparently she does not get replacements anymore from apnea.  Her machine is old as well.  She wants to change companies.  I will arrange for home sleep study and then we will place orders for a new device with a different company.  We will plan to order AutoSet CPAP 5-15 cmH2O  COPD (chronic obstructive pulmonary disease) (HCC) Her symptoms or not very interested.  We talked about smoking cessation.  She will keep her albuterol available to use if needed.  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  We set a goal to  get down to 15 cigarettes daily by the next visit.  We will continue to cut down after that.  She has a lung cancer screening CT chest to be done in June to follow a new right apical nodule.  Her other nodules were stable.  Baltazar Apo, MD, PhD 03/04/2023, 9:45 AM [facility] [phone] or if no answer [phone]

## 2023-03-04 NOTE — Patient Instructions (Addendum)
Thank you for coming in today.   Please get labs today before you leave   Colchicine daily as needed.   I will let you know what Dr [name] says.

## 2023-03-04 NOTE — Patient Instructions (Signed)
Continue wear your CPAP every night. We will arrange for a home sleep study so that we can start the process to get you a new CPAP machine, new equipment. Keep your albuterol available to use 2 puffs up to every 4 hours if needed for shortness of breath, chest tightness, wheezing. We talked about smoking cessation today.  We set a goal to get down to 15 cigarettes daily by her next visit.  At that time we can talk about cutting down further. We will arrange for a repeat lung cancer screening CT scan of the chest in June 2024. Follow Dr. Lamonte Sakai in 2 months so we can review the home sleep study result.

## 2023-03-04 NOTE — Assessment & Plan Note (Signed)
Following her imaging, next scan to be a repeat lung cancer screening CT in June

## 2023-03-05 ENCOUNTER — Encounter: Payer: Self-pay | Admitting: Family Medicine

## 2023-03-05 MED ORDER — ALLOPURINOL 100 MG PO TABS: 100.0000 mg | ORAL_TABLET | Freq: Every day | ORAL | 1 refills | Status: DC

## 2023-03-05 NOTE — Progress Notes (Signed)
Uric acid is significantly elevated 8.5.  I have prescribed allopurinol.  Please start taking allopurinol daily.  I like to recheck the lab in about a month.  Recommend returning to clinic for about 1 month from now or so.

## 2023-03-09 LAB — HM DIABETES EYE EXAM

## 2023-04-08 DIAGNOSIS — G4730 Sleep apnea, unspecified: Secondary | ICD-10-CM

## 2023-04-08 DIAGNOSIS — G4733 Obstructive sleep apnea (adult) (pediatric): Secondary | ICD-10-CM | POA: Diagnosis not present

## 2023-04-28 ENCOUNTER — Encounter: Payer: Self-pay | Admitting: Gastroenterology

## 2023-05-11 NOTE — Progress Notes (Unsigned)
Lisa [name] Date of Birth  07-25-1961 [facility] [address]    [address] [place], [place]  [address] [phone]  Fax  [phone]   Problem List:  1. Chest pain - normal cath in the past 2. Cigarette smoking. 3. COPD 4. Leg edema   Previous notes:  Lisa [name] a 62 year old female with a history of hypertension. He's had some occasional episodes of chest discomfort.  These episodes of chest pain occur at times and are not necessarily associated with eating, drinking, change of position, or exercise.  She [name] walking on the treadmill 3 times a week.  She does not have chest pain while she [name] walking.  Feb. 26, 2014:  Lisa [name] [name] doing well.  She has gained weight since I last saw her.  She has not been walking as much.  March 19, 2014:   She was seen by Lawson Fiscal last month for some chest pain. She had a Myoview study which revealed an intermediate risk. She continues to smoke. She [name] very upset and emotional at her last office visit.  She was started on Imdur but was  having lots of side effects.   She [name] still trying to stop smoking.   She has attended the smoking cessation classes at [facility].    She has started walking with her son several days ago.  The exercise [name] going OK.    She does have some occasional click chest pains. These only last for several seconds.  She has had progressive bilateral edema    Oct. 28, 2015:  Lisa [name] feels ok.  No further episodes of CP Having numbness in her right thigh.   I suggested that this was a back or neuro issue. She has intermittant leg edema.  August 19th, 2016  Lisa [name] called today complaining of some leg edema. She's had  leg edema on an intermittent basis. BP has been well controlled. Has tried to exercise but has lots of back pain and leg pain and leg weakness.  The leg swelling has worsened over the past several weeks.  On lasix   Weight has increased significantly over the past year    Nov. 28, 2016:  Lisa [name] [name]  doing ok Watching what she eats  Has lost 14 lbs over 3 months    Wt Readings from Last 3 Encounters:  10/28/15 276 lb 1.9 oz (125.247 kg)  08/12/15 277 lb (125.646 kg)  07/19/15 290 lb (131.543 kg)   BP [name] well controlled.   Oct.  24, 2017:  Lisa [name] ws seen by urgent care last week . BP was 202 / 100  Also has chest pain . CP every day  We have done at least 2 caths  Has stopped smoking   Not eating salty food.   February 26, 2017:  Lisa [name] [name] seen today for follow up of an episode of CP last week.  Feeling better.  Still smoking  - less than a PPD . Seeing Dr. Delton Coombes  Has been diagnosed with PAD  - knows that this [name] due to smoking.     February 14, 2021: Lisa [name]  [name] seen today for a work in visit.  She has been having episodes of chest pain. Has mid sternal pain .   Twitch like sensation , lightheaded,  Last for a split second.   But then recurs several minutes later .  This   Wt [name] 260 lbs  Walks at work,  No exercise  Still smoking  May 12, 2023 Lisa [name] [name] seen for follow up of her HTN and chest pain . Wt [name] 264 lbs  Has tried [facility] weight loss  Was placed on ozympic but it was stopped due to constipation .  Works at [employer]  Stressful day  Very little exercise  Sedentary at work   Advised more exercise Still smoking  Advised her to stop      Current Outpatient Medications on File Prior to Visit  Medication Sig Dispense Refill   allopurinol (ZYLOPRIM) 100 MG tablet Take 1 tablet (100 mg total) by mouth daily. 90 tablet 1   ASPIRIN 81 PO Take 81 mg by mouth daily.     buPROPion (WELLBUTRIN XL) 150 MG 24 hr tablet Take 1 tablet (150 mg total) by mouth daily. 90 tablet 3   carvedilol (COREG) 25 MG tablet TAKE 1 TABLET BY MOUTH TWICE A DAY 180 tablet 3   Colchicine 0.6 MG CAPS Take 1 capsule (0.6 mg total) by mouth daily as needed. 30 capsule 2   dicyclomine (BENTYL) 10 MG capsule TAKE 1 CAPSULE (10 MG TOTAL) BY MOUTH 3 (THREE) TIMES DAILY BEFORE MEALS. AS  NEEDED 270 capsule 3   Dulaglutide (TRULICITY) 0.75 MG/0.5ML SOPN INJECT 0.75 MG SUBCUTANEOUSLY ONE TIME PER WEEK 6 mL 1   Evolocumab (REPATHA SURECLICK) 140 MG/ML SOAJ Inject 140 mg into the skin every 14 (fourteen) days. 6 mL 3   ONE TOUCH LANCETS MISC Use to check blood sugars twice a day 200 each 0   pantoprazole (PROTONIX) 40 MG tablet TAKE 1 TABLET BY MOUTH 30 MINUTES BEFORE BREAKFAST DAILY 90 tablet 1   telmisartan (MICARDIS) 40 MG tablet TAKE 1 TABLET BY MOUTH EVERY DAY 90 tablet 3   No current facility-administered medications on file prior to visit.    Allergies  Allergen Reactions   Almond Oil Anaphylaxis and Nausea And Vomiting    Almond-Nuts Only   Apple Juice Anaphylaxis   Ceftin [Cefuroxime] Other (See Comments)    Severe stomach pain - Diverticulitis flares up   Cherry Extract Anaphylaxis    Cherry fruit only    Other Anaphylaxis and Itching    Most fruits- Are NOT tolerated (PLEASE ASK PATIENT BEFORE GIVING, AS CERTAIN FRUITS ARE TOLERATED IN LIMITED QUANTITIES!!)   Guaifenesin & Derivatives Other (See Comments)    Restlessness, jittery   Hctz [Hydrochlorothiazide] Other (See Comments)    Causes a headache   Naproxen Other (See Comments)    Insomnia   Lisinopril Cough         Past Medical History:  Diagnosis Date   Allergic rhinitis    Arthritis    Asthma    Breast nodule    right breast, to see dr Vincente Poli 06-20-2012 for    Chest pain, non-cardiac    Chronic headaches    Cigarette smoker    Complication of anesthesia 05-17-2012   trouble breathing after colonscopy, needed nebulizer   COPD (chronic obstructive pulmonary disease) (HCC)    Diastolic congestive heart failure (HCC) 04/17/2019   Diverticulosis    GERD (gastroesophageal reflux disease)    Hepatic hemangioma    Hypertension    IBS (irritable bowel syndrome)    Nuclear stress test    Myoview 05/2019: EF 63, no ischemia. Low Risk   Obesity    Prosthetic eye globe    Pyloric erosion    Sleep  apnea    CPAP setting varies from 4-10   Type 2 diabetes mellitus (HCC) 03/11/2017  Past Surgical History:  Procedure Laterality Date   ABDOMINAL AORTOGRAM W/LOWER EXTREMITY N/A 04/30/2017   Procedure: Abdominal Aortogram w/Lower Extremity;  Surgeon: Sherren Kerns, MD;  Location: [facility] INVASIVE CV LAB;  Service: Cardiovascular;  Laterality: N/A;   CARDIAC CATHETERIZATION  12/28/2007   EF 75%. IT REVEALS NORMAL/SUPRANORMAL LEFT VENTRICULAR SYSTOLIC FUNCTION   CARDIOVASCULAR STRESS TEST  03/25/2007   EF 78%   CESAREAN SECTION  2003, 2009   x 2   CHOLECYSTECTOMY  1993   COLONOSCOPY  2014   ENDOMETRIAL ABLATION  09/2010   and D&C   HERNIA REPAIR  2004   umbilical   LAPAROSCOPY     PERIPHERAL VASCULAR BALLOON ANGIOPLASTY Left 04/30/2017   Procedure: Peripheral Vascular Balloon Angioplasty;  Surgeon: Sherren Kerns, MD;  Location: [facility] INVASIVE CV LAB;  Service: Cardiovascular;  Laterality: Left;  SFA   RECONSTRUCTION OF EYELID Bilateral    TUBAL LIGATION     UMBILICAL HERNIA REPAIR  2004   US ECHOCARDIOGRAPHY  05/08/2008   EF 55-60%   VENTRAL HERNIA REPAIR  06/15/2012   Procedure: LAPAROSCOPIC VENTRAL HERNIA;  Surgeon: Mariella Saa, MD;  Location: [facility] ORS;  Service: General;  Laterality: N/A;  LAPAROSCOPIC REPAIR VENTRAL HERNIA   VIDEO BRONCHOSCOPY Bilateral 11/15/2017   Procedure: VIDEO BRONCHOSCOPY WITH FLUORO;  Surgeon: Leslye Peer, MD;  Location: [facility] ENDOSCOPY;  Service: Cardiopulmonary;  Laterality: Bilateral;    Social History   Tobacco Use  Smoking Status Every Day   Packs/day: 1.50   Years: 38.00   Additional pack years: 0.00   Total pack years: 57.00   Types: Cigarettes  Smokeless Tobacco Never  Tobacco Comments   10 cigarettes daily ARJ 09/10/2020    Social History   Substance and Sexual Activity  Alcohol Use No   Alcohol/week: 0.0 standard drinks of alcohol    Family History  Problem Relation Age of Onset   Hypertension Mother    Lung cancer Mother         47   Heart disease Father        pacemaker or defibrillator   Breast cancer Maternal Aunt 78   Heart attack Paternal Uncle    Diabetes Maternal Grandmother    Colon cancer Neg Hx    Esophageal cancer Neg Hx    Pancreatic cancer Neg Hx     Reviw of Systems:  Reviewed in the HPI.  All other systems are negative.  Physical Exam: Blood pressure 134/72, pulse 81, height 5\' 6"  (1.676 m), weight 264 lb 12.8 oz (120.1 kg), SpO2 96 %.       GEN:  Well nourished, well developed in no acute distress HEENT: Normal NECK: No JVD; No carotid bruits LYMPHATICS: No lymphadenopathy CARDIAC: RRR , no murmurs, rubs, gallops RESPIRATORY:  Clear to auscultation without rales, wheezing or rhonchi  ABDOMEN: Soft, non-tender, non-distended MUSCULOSKELETAL:  No edema; No deformity  SKIN: Warm and dry NEUROLOGIC:  Alert and oriented x 3   ECG:     Assessment / Plan:         2. Cigarette smoking.   Still smoking , advised cessation    3. COPD -   4. PAD :     5. Essential HTN:   BP [name] basically normal      Kristeen Miss, MD  05/12/2023 4:22 PM    [facility] [address] [place],  [address] [place], [place]  [address] Pager [phone] Phone: [phone]; Fax: [phone]     

## 2023-05-12 ENCOUNTER — Ambulatory Visit: Payer: Managed Care, Other (non HMO) | Attending: Cardiovascular Disease | Admitting: Cardiovascular Disease

## 2023-05-12 ENCOUNTER — Encounter: Payer: Self-pay | Admitting: Cardiovascular Disease

## 2023-05-12 VITALS — BP 134/72 | HR 81 | Ht 66.0 in | Wt 264.8 lb

## 2023-05-12 DIAGNOSIS — I10 Essential (primary) hypertension: Secondary | ICD-10-CM | POA: Diagnosis not present

## 2023-05-12 NOTE — Patient Instructions (Signed)
Medication Instructions:  Your physician recommends that you continue on your current medications as directed. Please refer to the Current Medication list given to you today.  *If you need a refill on your cardiac medications before your next appointment, please call your pharmacy*   Lab Work: None If you have labs (blood work) drawn today and your tests are completely normal, you will receive your results only by: MyChart Message (if you have MyChart) OR A paper copy in the mail If you have any lab test that is abnormal or we need to change your treatment, we will call you to review the results.   Testing/Procedures: None   Follow-Up: At [facility], you and your health needs are our priority.  As part of our continuing mission to provide you with exceptional heart care, we have created designated Provider Care Teams.  These Care Teams include your primary Cardiologist (physician) and Advanced Practice Providers (APPs -  Physician Assistants and Nurse Practitioners) who all work together to provide you with the care you need, when you need it.   Your next appointment:   1 year(s)  Provider:   Philip Nahser, MD   

## 2023-05-21 ENCOUNTER — Other Ambulatory Visit: Payer: Self-pay

## 2023-05-21 DIAGNOSIS — G4730 Sleep apnea, unspecified: Secondary | ICD-10-CM

## 2023-05-26 ENCOUNTER — Other Ambulatory Visit: Payer: Managed Care, Other (non HMO)

## 2023-06-09 ENCOUNTER — Ambulatory Visit
Admission: RE | Admit: 2023-06-09 | Discharge: 2023-06-09 | Disposition: A | Discharge status: Home / Self Care | Payer: Managed Care, Other (non HMO) | Source: Ambulatory Visit | Attending: Acute Care | Admitting: Acute Care

## 2023-06-09 DIAGNOSIS — Z87891 Personal history of nicotine dependence: Secondary | ICD-10-CM

## 2023-06-09 DIAGNOSIS — R911 Solitary pulmonary nodule: Secondary | ICD-10-CM

## 2023-06-16 ENCOUNTER — Other Ambulatory Visit: Payer: Self-pay

## 2023-06-16 ENCOUNTER — Telehealth: Payer: Self-pay | Admitting: Acute Care

## 2023-06-16 DIAGNOSIS — Z87891 Personal history of nicotine dependence: Secondary | ICD-10-CM

## 2023-06-16 DIAGNOSIS — F17210 Nicotine dependence, cigarettes, uncomplicated: Secondary | ICD-10-CM

## 2023-06-16 NOTE — Telephone Encounter (Signed)
Unable to reach by phone to review results of LDCT. Rads2 with additional findings for review with patient. Left VM and call back number.

## 2023-06-23 NOTE — Telephone Encounter (Signed)
She is due for follow-up-please have her schedule this with [name]

## 2023-06-23 NOTE — Telephone Encounter (Signed)
Called and spoke to patient regarding her LDCT performed on 06/09/2023, impression below. Informed her of the LR2 and repeat due in 12 months. Also spoke with patient regarding the other findings noted on scan. Patient states she had a thyroid biopsy after last years scan when the thyroid nodule was noted. Patient states she was told the nodule was likely benign. Advised patient per results to follow up with her PCP in case additional imaging is warranted, patient verbalized understanding. Also spoke with patient regarding the right breast nodule and she is due for her mammogram, patient verbalized understanding and states she will call to schedule her annual mammogram. Lastly, I spoke with patient regarding PAH and the signs/symptoms it can cause. Advised patient she will also need to follow up with her PCP regarding these findings in case he would like additional testing to confirm PAH. Patient verbalized understanding of all the results and confirmed she would follow up with PCP in 7-10 days if they have not reached out to her regarding these results. Results have been sent to PCP. 12 month repeat scan has already been ordered.    IMPRESSION: 1. Lung-RADS 2, benign appearance or behavior. Continue annual screening with low-dose chest CT without contrast in 12 months. 2. 2.5 cm low-attenuation left thyroid nodule. Thyroid ultrasound was performed 07/17/2022 at which time, annual follow-up was recommended. Recommend thyroid ultrasound. (Ref: J Am Coll Radiol. 2015 Feb;12(2): 143-50). 3. Lateral right breast nodule, grossly similar to the most recent prior examination. Last screening mammogram performed 04/03/2022. Consider physical exam and mammogram/ultrasound, as clinically indicated. 4. Enlarged pulmonic trunk, indicative of pulmonary arterial hypertension.

## 2023-07-16 ENCOUNTER — Ambulatory Visit: Payer: Managed Care, Other (non HMO) | Admitting: Internal Medicine

## 2023-07-16 ENCOUNTER — Encounter: Payer: Self-pay | Admitting: Internal Medicine

## 2023-07-16 VITALS — BP 128/82 | HR 87 | Ht 66.0 in | Wt 264.0 lb

## 2023-07-16 DIAGNOSIS — E042 Nontoxic multinodular goiter: Secondary | ICD-10-CM | POA: Diagnosis not present

## 2023-07-16 DIAGNOSIS — N1831 Chronic kidney disease, stage 3a: Secondary | ICD-10-CM | POA: Diagnosis not present

## 2023-07-16 DIAGNOSIS — E1165 Type 2 diabetes mellitus with hyperglycemia: Secondary | ICD-10-CM | POA: Diagnosis not present

## 2023-07-16 DIAGNOSIS — Z7985 Long-term (current) use of injectable non-insulin antidiabetic drugs: Secondary | ICD-10-CM | POA: Diagnosis not present

## 2023-07-16 DIAGNOSIS — E1122 Type 2 diabetes mellitus with diabetic chronic kidney disease: Secondary | ICD-10-CM | POA: Diagnosis not present

## 2023-07-16 LAB — POCT GLYCOSYLATED HEMOGLOBIN (HGB A1C): Hemoglobin A1C: 7.9 % — AB (ref 4.0–5.6)

## 2023-07-16 MED ORDER — TRULICITY 1.5 MG/0.5ML ~~LOC~~ SOAJ: 1.5000 mg | SUBCUTANEOUS | 3 refills | Status: DC

## 2023-07-16 NOTE — Progress Notes (Signed)
Name: Lisa [name]  Age/ Sex: 62 y.o., female   MRN/ DOB: [id number], 1961-09-30     PCP: Shelva Majestic, MD   Reason for Endocrinology Evaluation: Type 2 Diabetes Mellitus/ Abnormal FNA  Initial Endocrine Consultative Visit: 05/23/2021    PATIENT IDENTIFIER: Lisa [name] is a 61 y.o. female with a past medical history of HTN, Migraine headaches, T2DM, diastolic dysfunction and PAD. The patient has followed with Endocrinology clinic since 05/23/2021 for consultative assistance with management of her diabetes.      DIABETIC HISTORY:  Ms. Beauchene was diagnosed with DM 2018. Was on Metformin but was stopped due to low GFR.Ozempic caused GI side effects . Her hemoglobin A1c has ranged from 6.5% in 02/2021, peaking at 9.5% in 08/2021.   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     SUBJECTIVE:    Today (07/16/2023): [name] is here for a follow up on diabetes management and MNG.  She checks her blood sugars 1 times daily. She did not bring her meter today .The patient has not had hypoglycemic episodes since the last clinic visit.  Denies nausea or vomiting  Denies constipation but has chronic diarrhea  Denies local neck swelling  Denies dysphagia  Denies palpitations , but has esophageal pain, has rare heartburn  Denies tremors   Her son was started on Zepbound  HOME DIABETES REGIMEN:  Trulicity 0.75 mg weekly   Statin: no, she is on Praluent  ACE-I/ARB: yes Prior Diabetic Education:  No   METER DOWNLOAD SUMMARY: Did not bring it    DIABETIC COMPLICATIONS:  Macrovascular complications:   Denies: CAD, CVA, PVD   HISTORY:  Past Medical History:  Past Medical History:  Diagnosis Date   Allergic rhinitis    Arthritis    Asthma    Breast nodule    right breast, to see dr Vincente Poli 06-20-2012 for    Chest pain, non-cardiac    Chronic headaches    Cigarette smoker    Complication of anesthesia 05-17-2012   trouble breathing after colonscopy, needed nebulizer   COPD (chronic obstructive pulmonary disease) (HCC)    Diastolic congestive heart failure (HCC) 04/17/2019   Diverticulosis    GERD (gastroesophageal reflux disease)    Hepatic hemangioma    Hypertension    IBS (irritable bowel syndrome)    Nuclear stress test    Myoview 05/2019: EF 63, no ischemia. Low Risk   Obesity    Prosthetic eye globe    Pyloric erosion    Sleep apnea    CPAP setting varies from 4-10   Type 2 diabetes mellitus (HCC) 03/11/2017   Past Surgical History:  Past Surgical History:  Procedure Laterality Date   ABDOMINAL AORTOGRAM W/LOWER EXTREMITY N/A 04/30/2017   Procedure: Abdominal Aortogram w/Lower Extremity;  Surgeon: Sherren Kerns, MD;  Location: [facility] INVASIVE CV LAB;  Service: Cardiovascular;  Laterality: N/A;   CARDIAC CATHETERIZATION  12/28/2007   EF 75%. IT REVEALS NORMAL/SUPRANORMAL LEFT VENTRICULAR SYSTOLIC FUNCTION   CARDIOVASCULAR STRESS TEST  03/25/2007  EF 78%   CESAREAN SECTION  2003, 2009   x 2   CHOLECYSTECTOMY  1993   COLONOSCOPY  2014   ENDOMETRIAL ABLATION  09/2010   and D&C   HERNIA REPAIR  2004   umbilical   LAPAROSCOPY     PERIPHERAL VASCULAR BALLOON ANGIOPLASTY Left 04/30/2017   Procedure: Peripheral Vascular Balloon Angioplasty;  Surgeon: Sherren Kerns, MD;  Location: [facility] INVASIVE CV LAB;  Service: Cardiovascular;  Laterality: Left;  SFA   RECONSTRUCTION OF EYELID Bilateral    TUBAL LIGATION     UMBILICAL HERNIA REPAIR  2004   US ECHOCARDIOGRAPHY   05/08/2008   EF 55-60%   VENTRAL HERNIA REPAIR  06/15/2012   Procedure: LAPAROSCOPIC VENTRAL HERNIA;  Surgeon: Mariella Saa, MD;  Location: [facility] ORS;  Service: General;  Laterality: N/A;  LAPAROSCOPIC REPAIR VENTRAL HERNIA   VIDEO BRONCHOSCOPY Bilateral 11/15/2017   Procedure: VIDEO BRONCHOSCOPY WITH FLUORO;  Surgeon: Leslye Peer, MD;  Location: [facility] ENDOSCOPY;  Service: Cardiopulmonary;  Laterality: Bilateral;   Social History:  reports that she has been smoking cigarettes. She has a 57 pack-year smoking history. She has never used smokeless tobacco. She reports that she does not drink alcohol and does not use drugs. Family History:  Family History  Problem Relation Age of Onset   Hypertension Mother    Lung cancer Mother        12   Heart disease Father        pacemaker or defibrillator   Breast cancer Maternal Aunt 78   Heart attack Paternal Uncle    Diabetes Maternal Grandmother    Colon cancer Neg Hx    Esophageal cancer Neg Hx    Pancreatic cancer Neg Hx      HOME MEDICATIONS: Allergies as of 07/16/2023       Reactions   Almond Oil Anaphylaxis, Nausea And Vomiting   Almond-Nuts Only   Apple Juice Anaphylaxis   Ceftin [cefuroxime] Other (See Comments)   Severe stomach pain - Diverticulitis flares up   Cherry Extract Anaphylaxis   Cherry fruit only   Other Anaphylaxis, Itching   Most fruits- Are NOT tolerated (PLEASE ASK PATIENT BEFORE GIVING, AS CERTAIN FRUITS ARE TOLERATED IN LIMITED QUANTITIES!!)   Guaifenesin & Derivatives Other (See Comments)   Restlessness, jittery   Hctz [hydrochlorothiazide] Other (See Comments)   Causes a headache   Naproxen Other (See Comments)   Insomnia   Lisinopril Cough           Medication List        Accurate as of July 16, 2023  3:00 PM. If you have any questions, ask your nurse or doctor.          allopurinol 100 MG tablet Commonly known as: ZYLOPRIM Take 1 tablet (100 mg total) by mouth daily.   ASPIRIN 81  PO Take 81 mg by mouth daily.   buPROPion 150 MG 24 hr tablet Commonly known as: WELLBUTRIN XL Take 1 tablet (150 mg total) by mouth daily.   carvedilol 25 MG tablet Commonly known as: COREG TAKE 1 TABLET BY MOUTH TWICE A DAY   Colchicine 0.6 MG Caps Take 1 capsule (0.6 mg total) by mouth daily as needed.   dicyclomine 10 MG capsule Commonly known as: BENTYL TAKE 1 CAPSULE (10 MG TOTAL) BY MOUTH 3 (THREE) TIMES DAILY BEFORE MEALS. AS NEEDED   ONE TOUCH LANCETS Misc Use to check blood sugars twice a day   pantoprazole 40 MG tablet  Commonly known as: PROTONIX TAKE 1 TABLET BY MOUTH 30 MINUTES BEFORE BREAKFAST DAILY   Repatha SureClick 140 MG/ML Soaj Generic drug: Evolocumab Inject 140 mg into the skin every 14 (fourteen) days.   telmisartan 40 MG tablet Commonly known as: MICARDIS TAKE 1 TABLET BY MOUTH EVERY DAY   Trulicity 0.75 MG/0.5ML Sopn Generic drug: Dulaglutide INJECT 0.75 MG SUBCUTANEOUSLY ONE TIME PER WEEK         OBJECTIVE:   Vital Signs: BP 128/82 (BP Location: Left Arm, Patient Position: Sitting, Cuff Size: Large)   Pulse 87   Ht 5\' 6"  (1.676 m)   Wt 264 lb (119.7 kg)   SpO2 95%   BMI 42.61 kg/m   Wt Readings from Last 3 Encounters:  07/16/23 264 lb (119.7 kg)  05/12/23 264 lb 12.8 oz (120.1 kg)  03/04/23 263 lb (119.3 kg)     Exam: General: Pt appears well and is in NAD  Neck: General: Supple without adenopathy. Thyroid: Thyroid size normal.  Bilateral thyroid nodules appreciated  Lungs: Clear with good BS bilat with no rales, rhonchi, or wheezes  Heart: RRR   Extremities: No pretibial edema.   Neuro: MS is good with appropriate affect, pt is alert and Ox3      DM Foot Exam 07/16/2023 The skin of the feet is intact without sores or ulcerations. The pedal pulses are 2+ on right and 2+ on left. The sensation is intact to a screening 5.07, 10 gram monofilament bilaterally  DATA REVIEWED:  Lab Results  Component Value Date   HGBA1C  7.3 (H) 02/16/2023   HGBA1C 8.6 (H) 04/24/2022   HGBA1C 8.2 (H) 01/15/2022     Latest Reference Range & Units 07/16/23 15:32  TSH 0.450 - 4.500 uIU/mL 1.630  T4,Free(Direct) 0.82 - 1.77 ng/dL 4.09       Thyroid Ultrasound 07/17/2022  Estimated total number of nodules >/= 1 cm: 2   Number of spongiform nodules >/=  2 cm not described below (TR1): 0   Number of mixed cystic and solid nodules >/= 1.5 cm not described below (TR2): 0   _________________________________________________________   Nodule 1: 3.1 cm inferior left nodule, previously 2.6 cm. This was previously biopsied.   Nodule # 2:   Prior biopsy: No   Location: Left; superior   Maximum size: 1 cm; Other 2 dimensions: 0.7 x 0.8 cm, previously, 1 x 0.6 x 1 cm   Composition: solid/almost completely solid (2)   Echogenicity: hypoechoic (2)   Shape: not taller-than-wide (0)   Margins: smooth (0)   Echogenic foci: none (0)   ACR TI-RADS total points: 4.   ACR TI-RADS risk category:  TR 4.   Significant change in size (>/= 20% in two dimensions and minimal increase of 2 mm): No   Change in features: No   Change in ACR TI-RADS risk category: No   ACR TI-RADS recommendations:   *Given size (>/= 1 - 1.4 cm) and appearance, a follow-up ultrasound in 1 year should be considered based on TI-RADS criteria.   _________________________________________________________   No new nodules.  No regional cervical adenopathy identified   IMPRESSION: 1. Thyromegaly with stable left nodules. None meet criteria for biopsy. 2. Recommend annual/biennial ultrasound follow-up of superior left nodule as above, until stability x5 years confirmed.    FNA 04/22/2021   Clinical History: Left; Inferior 2.6cm; Other 2 dimensions: 2.4 x 2.0cm,  Solid /almost completely solid, Hypoechoic, TI-RADS total points 4.  Specimen Submitted:  A. 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 Goal A1c < 7.0 %.    -She has been on metformin in the past, I suspect this has been discontinued due to low GFR at some point  -She has been on Ozempic but this caused GI issues  -Intolerant to [place] due to severe yeast infection -She has done well with Trulicity, she agreed to increasing the dose   MEDICATIONS: Increase  Trulicity 1.5 mg weekly   EDUCATION / INSTRUCTIONS: BG monitoring instructions: Patient is instructed to check her blood sugars 1 times a day, fasting. Call [place] [facility] if: BG persistently < 70 I reviewed the Rule of 15 for the treatment of hypoglycemia in detail with the patient. Literature supplied.     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  -Will repeat thyroid ultrasound for this year - TFT's remain normal     F/U in 6 months   Signed electronically by: Lyndle Herrlich, MD  [facility] Endocrinology  [facility] [address] [place], [place] [address] Phone: [phone] FAX: [phone]   CC: Shelva Majestic, MD [address] [place] [address] Phone: [phone]  Fax: [phone]  Return to Endocrinology clinic as below: No future appointments.

## 2023-07-16 NOTE — Patient Instructions (Signed)
Increase Trulicity 1.5 mg weekly

## 2023-07-17 LAB — T4, FREE: Free T4: 1.5 ng/dL (ref 0.82–1.77)

## 2023-07-17 LAB — TSH: TSH: 1.63 u[IU]/mL (ref 0.450–4.500)

## 2023-07-19 ENCOUNTER — Other Ambulatory Visit: Payer: Self-pay | Admitting: Internal Medicine

## 2023-08-05 ENCOUNTER — Ambulatory Visit
Admission: RE | Admit: 2023-08-05 | Discharge: 2023-08-05 | Disposition: A | Discharge status: Home / Self Care | Payer: Managed Care, Other (non HMO) | Source: Ambulatory Visit | Attending: Internal Medicine | Admitting: Internal Medicine

## 2023-08-05 DIAGNOSIS — E042 Nontoxic multinodular goiter: Secondary | ICD-10-CM

## 2023-08-14 ENCOUNTER — Other Ambulatory Visit: Payer: Self-pay | Admitting: Gastroenterology

## 2023-08-18 ENCOUNTER — Other Ambulatory Visit: Payer: Self-pay | Admitting: Internal Medicine

## 2023-08-18 ENCOUNTER — Other Ambulatory Visit: Payer: Self-pay | Admitting: Family Medicine

## 2023-08-18 NOTE — Telephone Encounter (Signed)
Rx refill request approved per Dr. Zollie Pee orders.

## 2023-09-03 ENCOUNTER — Telehealth: Payer: Self-pay | Admitting: Family Medicine

## 2023-09-03 NOTE — Telephone Encounter (Signed)
FYI: This call has been transferred to triage nurse: the Triage Nurse. Once the result note has been entered staff can address the message at that time.  Patient called in with the following symptoms:  Red Word: Blood sugar 339,  fatigue, extremely tired-fell asleep at desk, blurred vision on and off, head ache   Please advise at Mobile [phone] (mobile)  Message is routed to Provider Pool.

## 2023-09-03 NOTE — Telephone Encounter (Signed)
Please see note, pt spoke with triage and awaiting triage note to forward

## 2023-09-03 NOTE — Telephone Encounter (Signed)
Nash Dimmer, triage nurse, called back stating final outcome was for patient to be seen in office soon. I informed Nash Dimmer that we do not have available appointments at this current time. Nash Dimmer verbalized understanding and stated she would advise patient to go to either UC or ED then follow up with PCP or another provider.

## 2023-09-03 NOTE — Telephone Encounter (Signed)
Patient Advised: Go to ED Now   Patient Name First: Lisa Last: [name] Gender: Female DOB: 1961-11-02 Age: 62 Y 11 M 20 D Return Phone Number: [phone] (Primary) Address: City/ State/ Zip: [place]  [address] Client [facility] - Administrator, [facility] at [facility] Contact Type Call Who Is Calling Patient / Member / Family / Caregiver Call Type Triage / Clinical Relationship To Patient Self Return Phone Number [phone] (Primary) Chief Complaint Headache Reason for Call Symptomatic / Request for Health Information Initial Comment Caller states that her blood sugar is 339. She fell asleep at her desk as well. She has a headache, and fatigue and also blurred vision on and off. The doctors name is Dr. Durene Cal. She wanted to make the nurse aware that about a week ago she has 2 steriod injections. Translation No Nurse Assessment Nurse: Elesa Hacker, RN, Nash Dimmer Date/Time (Eastern Time): 09/03/2023 8:40:17 AM Confirm and document reason for call. If symptomatic, describe symptoms. ---Caller states that her BS is 339 fasting this am. States that her BS has not been high in years. Caller had steroid injections on Friday. Caller takes Trulicity. Does not have the med, unable to get the medication. Has been out of the Trulicity x 2 weeks. Has not called the MD. Orthopedic MD gave injections for arthritis in hand. Does the patient have any new or worsening symptoms? ---Yes Will a triage be completed? ---Yes Related visit to physician within the last 2 weeks? ---No Does the PT have any chronic conditions? (i.e. diabetes, asthma, this includes High risk factors for pregnancy, etc.) ---Yes List chronic conditions. ---Diabetes arthritis HTN high cholesterol, not taking med, pharmacy does not have in stock. Is this a behavioral health or substance abuse call? ---No  Guidelines Guideline Title Affirmed Question  Affirmed Notes Nurse Date/Time (Eastern Time) Diabetes - High Blood Sugar [1] Caller has URGENT medication or insulin device (e.g., pump, continuous monitoring) question AND [2] triager unable to answer question Elesa Hacker, RN, Nash Dimmer 09/03/2023 8:44:04 AM Headache Patient sounds very sick or weak to the triager Elesa Hacker, RN, Nash Dimmer 09/03/2023 8:46:44 AM Disp. Time Lamount Cohen Time) Disposition Final User 09/03/2023 8:46:24 AM See HCP within 4 Hours (or PCP triage) Elesa Hacker, RN, Nash Dimmer 09/03/2023 8:49:55 AM Go to ED Now (or PCP triage) Yes Deaton, RN, Nash Dimmer Final Disposition 09/03/2023 8:49:55 AM Go to ED Now (or PCP triage) Yes Deaton, RN, Nash Dimmer Disposition Overriden: Call PCP Now Override Reason: Patient's symptoms need a higher level of care Caller Disagree/Comply Comply Caller Understands Yes PreDisposition Did not know what to do Care Advice Given Per Guideline CALL PCP NOW: * You need to discuss this with your doctor (or NP/PA). * I'll page the on-call provider now. If you haven't heard from the provider (or me) within 30 minutes, call again. SEE HCP (OR PCP TRIAGE) WITHIN 4 HOURS: * IF OFFICE WILL BE OPEN: You need to be seen within the next 3 or 4 hours. Call your doctor (or NP/PA) now or as soon as the office opens. CALL BACK IF: * Vomiting occurs * Rapid breathing occurs * You become worse CARE ADVICE given per Diabetes - High Blood Sugar (Adult) guideline. GO TO ED/UCC NOW (OR PCP TRIAGE): * ED: Patients who may need surgery, need hospitalization, sound seriously ill or may be unstable need to be sent to an ED. Likewise, so do most patients with complex medical problems and serious symptoms. * ACETAMINOPHEN - EXTRA STRENGTH TYLENOL: Take  1,000 mg (two 500 mg pills) every 6 to 8 hours as needed. Each Extra Strength Tylenol pill has 500 mg of acetaminophen. The most you should take is 6 pills a day (3,000 mg total). Note: In [place], the maximum is 8 pills a day (4,000 mg total).  ANOTHER ADULT SHOULD DRIVE: * It is better and safer if another adult drives instead of you. CARE ADVICE given per Headache (Adult) guideline. * UCC IS OPEN: Some Urgent Care Centers (UCCs) can manage patients who are stable and have less serious symptoms (e.g., minor illnesses and injuries). The triager must know the [facility] capabilities before sending a patient there. If unsure, call ahead.  Comments User: Wandra Scot, RN Date/Time Lamount Cohen Time): 09/03/2023 8:58:07 AM Caller is out of her trulicity and the pharmacy does not have the med to fill, has not had for weeks, per caller. Caller has not called MD. Jethro Bolus received steroid injections last Friday, by ortho for arthritis. Unable to get B/P , cuff not working. Caller has a headache with blurry vision. Advised to go to [facility] or ED. Caller wanted to be seen in the office. Called the backline. Spoke with havelyn, no appts. Advised patient to go to [facility] or ED, caller advised that she will. Referrals [facility] - ED

## 2023-09-07 ENCOUNTER — Ambulatory Visit (INDEPENDENT_AMBULATORY_CARE_PROVIDER_SITE_OTHER): Payer: Managed Care, Other (non HMO) | Admitting: Physician Assistant

## 2023-09-07 ENCOUNTER — Encounter: Payer: Self-pay | Admitting: Physician Assistant

## 2023-09-07 VITALS — BP 138/80 | HR 76 | Temp 97.3°F | Ht 66.0 in | Wt 255.0 lb

## 2023-09-07 DIAGNOSIS — Z23 Encounter for immunization: Secondary | ICD-10-CM | POA: Diagnosis not present

## 2023-09-07 DIAGNOSIS — R7309 Other abnormal glucose: Secondary | ICD-10-CM | POA: Diagnosis not present

## 2023-09-07 LAB — POC URINALSYSI DIPSTICK (AUTOMATED)
Bilirubin, UA: 0
Blood, UA: POSITIVE
Glucose, UA: POSITIVE — AB
Ketones, UA: 0
Leukocytes, UA: NEGATIVE
Nitrite, UA: NEGATIVE
Protein, UA: NEGATIVE
Spec Grav, UA: 1.02 (ref 1.010–1.025)
Urobilinogen, UA: 0.2 U/dL
pH, UA: 5 (ref 5.0–8.0)

## 2023-09-07 LAB — COMPREHENSIVE METABOLIC PANEL
ALT: 20 U/L (ref 0–35)
AST: 12 U/L (ref 0–37)
Albumin: 3.9 g/dL (ref 3.5–5.2)
Alkaline Phosphatase: 99 U/L (ref 39–117)
BUN: 25 mg/dL — ABNORMAL HIGH (ref 6–23)
CO2: 25 meq/L (ref 19–32)
Calcium: 10.1 mg/dL (ref 8.4–10.5)
Chloride: 102 meq/L (ref 96–112)
Creatinine, Ser: 1.1 mg/dL (ref 0.40–1.20)
GFR: 54.82 mL/min — ABNORMAL LOW (ref >60.00)
Glucose, Bld: 334 mg/dL — ABNORMAL HIGH (ref 70–99)
Potassium: 4.3 meq/L (ref 3.5–5.1)
Sodium: 135 meq/L (ref 135–145)
Total Bilirubin: 0.6 mg/dL (ref 0.2–1.2)
Total Protein: 7 g/dL (ref 6.0–8.3)

## 2023-09-07 LAB — CBC WITH DIFFERENTIAL/PLATELET
Basophils Absolute: 0 10*3/uL (ref 0.0–0.1)
Basophils Relative: 0.7 % (ref 0.0–3.0)
Eosinophils Absolute: 0.1 10*3/uL (ref 0.0–0.7)
Eosinophils Relative: 1.6 % (ref 0.0–5.0)
HCT: 46.9 % — ABNORMAL HIGH (ref 36.0–46.0)
Hemoglobin: 15.2 g/dL — ABNORMAL HIGH (ref 12.0–15.0)
Lymphocytes Relative: 26.2 % (ref 12.0–46.0)
Lymphs Abs: 1.8 10*3/uL (ref 0.7–4.0)
MCHC: 32.5 g/dL (ref 30.0–36.0)
MCV: 91.7 fL (ref 78.0–100.0)
Monocytes Absolute: 0.5 10*3/uL (ref 0.1–1.0)
Monocytes Relative: 7.8 % (ref 3.0–12.0)
Neutro Abs: 4.4 10*3/uL (ref 1.4–7.7)
Neutrophils Relative %: 63.7 % (ref 43.0–77.0)
Platelets: 215 10*3/uL (ref 150.0–400.0)
RBC: 5.11 Mil/uL (ref 3.87–5.11)
RDW: 13.8 % (ref 11.5–15.5)
WBC: 6.9 10*3/uL (ref 4.0–10.5)

## 2023-09-07 LAB — GLUCOSE, POCT (MANUAL RESULT ENTRY): POC Glucose: 335 mg/dL — AB (ref 70–99)

## 2023-09-07 LAB — HEMOGLOBIN A1C: Hgb A1c MFr Bld: 9.6 % — ABNORMAL HIGH (ref 4.6–6.5)

## 2023-09-07 MED ORDER — TIRZEPATIDE 2.5 MG/0.5ML ~~LOC~~ SOAJ: 2.5000 mg | SUBCUTANEOUS | 0 refills | Status: DC

## 2023-09-07 NOTE — Patient Instructions (Signed)
It was great to see you!  Start Mounjaro 2.5 mg weekly  I will let endocrinology know what's going on, however I recommend that you call them as well and try to schedule a follow-up appointment on all of this  We will update blood work today  Contact a doctor if: Your blood sugar level is at or above 240 mg/dL (21.3 mmol/L) for 2 days in a row. You have problems keeping your blood sugar in your target range. You have high blood pressure often. You have signs of illness, such as: Feeling like you may vomit (feeling nauseous). Vomiting. A fever.  Get help right away if: Your blood sugar monitor reads "high" even when you are taking insulin. You have trouble breathing. You have a change in how you think, feel, or act (mental status). You feel like you may vomit, and the feeling does not go away. You cannot stop vomiting.  These symptoms may be an emergency. Get medical help right away. Call your local emergency services (911 in the [place] Do not wait to see if the symptoms will go away. Do not drive yourself to the hospital.  Summary Hyperglycemia is when the sugar (glucose) level in your blood is too high. High blood sugar can happen to people who have or do not have diabetes. Make sure you drink enough fluids and follow your meal plan. Exercise as often as told by your doctor. Contact your doctor if you have problems keeping your blood sugar in your target range.  This information is not intended to replace advice given to you by your health care provider. Make sure you discuss any questions you have with your health care provider.   Take care,  Jarold Motto PA-C

## 2023-09-07 NOTE — Progress Notes (Signed)
Lisa [name] is a 62 y.o. female here for a follow up of a pre-existing problem.  History of Present Illness:   Chief Complaint  Patient presents with   Diabetes    Pt c/o feeling off , sleepy, headaches, blood sugars since Friday 339, five minutes later 333, 343 at 8:12, 324 at 11:04, 288 at 3:52, 267 at 10:08 PM, Sun 360 at 8:59 AM, Mon 374 at 6:14 AM, Today was 283 at 6:54 AM.   HPI  Diabetes: Managed with 1.5 mg Trulicity weekly States she has been off Trulicity for 3 weeks due to difficulty acquiring it.  Followed by Lisa Mora, MD with endocinology.  She has not contacted them about not being able to get her medicine.   Reports initially symptoms presented with a productive cough, so she tested herself for Covid (negative), and then she started falling asleep at her desk and experiencing headaches, so she checked her glucose.   Endorses mild cough and bubbles in urine (does drink a lot of water); denies fever or craving water. Also endorses vision changes around onset of symptoms.   Notes she monitors blood sugars as needed.   At home readings since Friday, 10/4:  10/04 339 333 (five minutes later) 334 (8:12) 324 (11:04) 288 (3:52) 267 (10:08 PM)  10/06 360 (8:58 AM)  10/07 374 (6:14 AM)  10/08 283 (6:54 AM)      Lab Results  Component Value Date   HGBA1C 7.9 (A) 07/16/2023   Past Medical History:  Diagnosis Date   Allergic rhinitis    Arthritis    Asthma    Breast nodule    right breast, to see dr Vincente Poli 06-20-2012 for    Chest pain, non-cardiac    Chronic headaches    Cigarette smoker    Complication of anesthesia 05-17-2012   trouble breathing after colonscopy, needed nebulizer   COPD (chronic obstructive pulmonary disease) (HCC)    Diastolic congestive heart failure (HCC) 04/17/2019   Diverticulosis    GERD (gastroesophageal reflux disease)    Hepatic hemangioma    Hypertension    IBS (irritable bowel syndrome)    Nuclear stress  test    Myoview 05/2019: EF 63, no ischemia. Low Risk   Obesity    Prosthetic eye globe    Pyloric erosion    Sleep apnea    CPAP setting varies from 4-10   Type 2 diabetes mellitus (HCC) 03/11/2017    Social History   Tobacco Use   Smoking status: Every Day    Current packs/day: 1.50    Average packs/day: 1.5 packs/day for 38.0 years (57.0 ttl pk-yrs)    Types: Cigarettes   Smokeless tobacco: Never   Tobacco comments:    10 cigarettes daily ARJ 09/10/2020  Vaping Use   Vaping status: Never Used  Substance Use Topics   Alcohol use: No    Alcohol/week: 0.0 standard drinks of alcohol   Drug use: No   Past Surgical History:  Procedure Laterality Date   ABDOMINAL AORTOGRAM W/LOWER EXTREMITY N/A 04/30/2017   Procedure: Abdominal Aortogram w/Lower Extremity;  Surgeon: Sherren Kerns, MD;  Location: [facility];  Service: Cardiovascular;  Laterality: N/A;   CARDIAC CATHETERIZATION  12/28/2007   EF 75%. IT REVEALS NORMAL/SUPRANORMAL LEFT VENTRICULAR SYSTOLIC FUNCTION   CARDIOVASCULAR STRESS TEST  03/25/2007   EF 78%   CESAREAN SECTION  2003, 2009   x 2   CHOLECYSTECTOMY  1993   COLONOSCOPY  2014   ENDOMETRIAL  ABLATION  09/2010   and D&C   HERNIA REPAIR  2004   umbilical   LAPAROSCOPY     PERIPHERAL VASCULAR BALLOON ANGIOPLASTY Left 04/30/2017   Procedure: Peripheral Vascular Balloon Angioplasty;  Surgeon: Sherren Kerns, MD;  Location: [facility] INVASIVE CV LAB;  Service: Cardiovascular;  Laterality: Left;  SFA   RECONSTRUCTION OF EYELID Bilateral    TUBAL LIGATION     UMBILICAL HERNIA REPAIR  2004   US ECHOCARDIOGRAPHY  05/08/2008   EF 55-60%   VENTRAL HERNIA REPAIR  06/15/2012   Procedure: LAPAROSCOPIC VENTRAL HERNIA;  Surgeon: Mariella Saa, MD;  Location: [facility];  Service: General;  Laterality: N/A;  LAPAROSCOPIC REPAIR VENTRAL HERNIA   VIDEO BRONCHOSCOPY Bilateral 11/15/2017   Procedure: VIDEO BRONCHOSCOPY WITH FLUORO;  Surgeon: Leslye Peer, MD;  Location: [facility]  ENDOSCOPY;  Service: Cardiopulmonary;  Laterality: Bilateral;   Family History  Problem Relation Age of Onset   Hypertension Mother    Lung cancer Mother        20   Heart disease Father        pacemaker or defibrillator   Breast cancer Maternal Aunt 78   Heart attack Paternal Uncle    Diabetes Maternal Grandmother    Colon cancer Neg Hx    Esophageal cancer Neg Hx    Pancreatic cancer Neg Hx    Allergies  Allergen Reactions   Almond Oil Anaphylaxis and Nausea And Vomiting    Almond-Nuts Only   Apple Juice Anaphylaxis   Ceftin [Cefuroxime] Other (See Comments)    Severe stomach pain - Diverticulitis flares up   Cherry Extract Anaphylaxis    Cherry fruit only    Other Anaphylaxis and Itching    Most fruits- Are NOT tolerated (PLEASE ASK PATIENT BEFORE GIVING, AS CERTAIN FRUITS ARE TOLERATED IN LIMITED QUANTITIES!!)   Guaifenesin & Derivatives Other (See Comments)    Restlessness, jittery   Hctz [Hydrochlorothiazide] Other (See Comments)    Causes a headache   Naproxen Other (See Comments)    Insomnia   Lisinopril Cough        Current Medications:   Current Outpatient Medications:    allopurinol (ZYLOPRIM) 100 MG tablet, TAKE 1 TABLET BY MOUTH EVERY DAY, Disp: 90 tablet, Rfl: 1   ASPIRIN 81 PO, Take 81 mg by mouth daily., Disp: , Rfl:    buPROPion (WELLBUTRIN XL) 150 MG 24 hr tablet, TAKE 1 TABLET BY MOUTH EVERY DAY, Disp: 90 tablet, Rfl: 3   carvedilol (COREG) 25 MG tablet, TAKE 1 TABLET BY MOUTH TWICE A DAY, Disp: 180 tablet, Rfl: 3   dicyclomine (BENTYL) 10 MG capsule, TAKE 1 CAPSULE (10 MG TOTAL) BY MOUTH 3 (THREE) TIMES DAILY BEFORE MEALS. AS NEEDED, Disp: 270 capsule, Rfl: 3   Evolocumab (REPATHA SURECLICK) 140 MG/ML SOAJ, Inject 140 mg into the skin every 14 (fourteen) days., Disp: 6 mL, Rfl: 3   ONE TOUCH LANCETS MISC, Use to check blood sugars twice a day, Disp: 200 each, Rfl: 0   pantoprazole (PROTONIX) 40 MG tablet, TAKE 1 TABLET BY MOUTH 30 MINUTES BEFORE  BREAKFAST DAILY, Disp: 90 tablet, Rfl: 0   telmisartan (MICARDIS) 40 MG tablet, TAKE 1 TABLET BY MOUTH EVERY DAY, Disp: 90 tablet, Rfl: 3   tirzepatide (MOUNJARO) 2.5 MG/0.5ML Pen, Inject 2.5 mg into the skin once a week., Disp: 2 mL, Rfl: 0  Review of Systems:   ROS See pertinent positives and negatives as per the HPI.  Vitals:   Vitals:  09/07/23 0947 09/07/23 1010  BP: (!) 140/70 138/80  Pulse: 76   Temp: (!) 97.3 F (36.3 C)   TempSrc: Temporal   SpO2: 97%   Weight: 255 lb (115.7 kg)   Height: 5\' 6"  (1.676 m)      Body mass index is 41.16 kg/m.  Physical Exam:   Physical Exam Vitals and nursing note reviewed.  Constitutional:      General: She is not in acute distress.    Appearance: She is well-developed. She is not ill-appearing or toxic-appearing.  Cardiovascular:     Rate and Rhythm: Normal rate and regular rhythm.     Pulses: Normal pulses.     Heart sounds: Normal heart sounds, S1 normal and S2 normal.  Pulmonary:     Effort: Pulmonary effort is normal.     Breath sounds: Normal breath sounds.  Skin:    General: Skin is warm and dry.  Neurological:     Mental Status: She is alert.     GCS: GCS eye subscore is 4. GCS verbal subscore is 5. GCS motor subscore is 6.  Psychiatric:        Speech: Speech normal.        Behavior: Behavior normal. Behavior is cooperative.    Results for orders placed or performed in visit on 09/07/23  POCT Urinalysis Dipstick (Automated)  Result Value Ref Range   Color, UA yellow    Clarity, UA clear    Glucose, UA Positive (A) Negative   Bilirubin, UA 0    Ketones, UA 0    Spec Grav, UA 1.020 1.010 - 1.025   Blood, UA pos    pH, UA 5.0 5.0 - 8.0   Protein, UA Negative Negative   Urobilinogen, UA 0.2 0.2 or 1.0 E.U./dL   Nitrite, UA neg    Leukocytes, UA Negative Negative  POCT Glucose (CBG)  Result Value Ref Range   POC Glucose 335 (A) 70 - 99 mg/dl    Assessment and Plan:   Elevated glucose Point of Care (POC)  glucose in office today is 335 Urine without ketones; vitals stable Will message her endocrinologist to let her know that she is unable to get  her Trulicity - she also reports that her insurance requires her to use [facility] so we are not able to look at alternative pharmacy I provided 1 month sample of Mounjaro 2.5 mg weekly for her to start We also discussed starting insulin but she prefers to not do this Low threshold to go to the ER if any new/worsening symptom(s) in the meantime I do not feel as though she is in DKA however I discussed that if any symptom(s) worsen, she is to go straight to the er  Need for immunization against influenza Completed today  I,Emily Lagle,acting as a scribe for [employer], PA.,have documented all relevant documentation on the behalf of Jarold Motto, PA,as directed by  Jarold Motto, PA while in the presence of Jarold Motto, [place].  I, Jarold Motto, [place], have reviewed all documentation for this visit. The documentation on 09/07/23 for the exam, diagnosis, procedures, and orders are all accurate and complete.  I spent a total of 45 minutes on this visit, today 09/07/23, which included reviewing previous notes from endocrinology and primary care provider, ordering tests, discussing plan of care with patient and using shared-decision making on next steps, refilling medications, and documenting the findings in the note.   Jarold Motto, PA-C

## 2023-09-08 LAB — URINE CULTURE
MICRO NUMBER:: 15567042
SPECIMEN QUALITY:: ADEQUATE

## 2023-09-15 ENCOUNTER — Encounter: Payer: Self-pay | Admitting: Physician Assistant

## 2023-09-21 ENCOUNTER — Ambulatory Visit (HOSPITAL_COMMUNITY)
Admission: EM | Admit: 2023-09-21 | Discharge: 2023-09-21 | Disposition: A | Discharge status: Home / Self Care | Payer: Managed Care, Other (non HMO) | Attending: Family Medicine | Admitting: Family Medicine

## 2023-09-21 ENCOUNTER — Encounter (HOSPITAL_COMMUNITY): Payer: Self-pay

## 2023-09-21 DIAGNOSIS — K529 Noninfective gastroenteritis and colitis, unspecified: Secondary | ICD-10-CM | POA: Diagnosis not present

## 2023-09-21 MED ORDER — ONDANSETRON 4 MG PO TBDP: 4.0000 mg | ORAL_TABLET | Freq: Three times a day (TID) | ORAL | 0 refills | Status: DC | PRN

## 2023-09-21 MED ORDER — ONDANSETRON 4 MG PO TBDP
ORAL_TABLET | ORAL | Status: AC
Start: 2023-09-21 — End: ?
  Filled 2023-09-21: qty 1

## 2023-09-21 MED ORDER — ONDANSETRON 4 MG PO TBDP
4.0000 mg | ORAL_TABLET | Freq: Once | ORAL | Status: AC
  Administered 2023-09-21: 4 mg via ORAL

## 2023-09-21 NOTE — Discharge Instructions (Signed)
Ondansetron dissolved in the mouth every 8 hours as needed for nausea or vomiting. Clear liquids(water, gatorade/pedialyte, ginger ale/sprite, chicken broth/soup) and bland things(crackers/toast, rice, potato, bananas) to eat. Avoid acidic foods like lemon/lime/orange/tomato, and avoid greasy/spicy foods.  We gave you 1 dose of this medication here in the office  If you worsen in any way, please present to the emergency room for further evaluation

## 2023-09-21 NOTE — ED Provider Notes (Signed)
[facility]    CSN: [id number] Arrival date & time: 09/21/23  1800      History   Chief Complaint Chief Complaint  Patient presents with   Headache   Nausea   Diarrhea    HPI Lisa [name] is a 62 y.o. female.    Headache Associated symptoms: diarrhea   Diarrhea Associated symptoms: headaches   Here for diarrhea and some nausea. Stool is soft. She did have some cramping and took some bentyl.  No f/c.  No vomiting.   All started today. Ate some ramen today, but also mentions people in her office have had a GI bug.  Past Medical History:  Diagnosis Date   Allergic rhinitis    Arthritis    Asthma    Breast nodule    right breast, to see dr Vincente Poli 06-20-2012 for    Chest pain, non-cardiac    Chronic headaches    Cigarette smoker    Complication of anesthesia 05-17-2012   trouble breathing after colonscopy, needed nebulizer   COPD (chronic obstructive pulmonary disease) (HCC)    Diastolic congestive heart failure (HCC) 04/17/2019   Diverticulosis    GERD (gastroesophageal reflux disease)    Hepatic hemangioma    Hypertension    IBS (irritable bowel syndrome)    Nuclear stress test    Myoview 05/2019: EF 63, no ischemia. 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   CKD (chronic kidney disease), stage III (HCC) 09/18/2020   Fatty liver 11/06/2019   Chronic rhinitis 06/26/2019   Trigger finger, right middle finger  05/04/2019   Coronary artery calcification 04/20/2019   Diastolic congestive heart failure (HCC) 04/17/2019   Bilateral leg pain 04/14/2018   Hyperlipidemia 04/12/2018   S/P bronchoscopy with biopsy    Hyperlipidemia associated with type 2 diabetes mellitus (HCC) 09/06/2017   Breast mass 08/03/2017   Chronic hip pain 07/29/2017   PAD (peripheral artery disease) (HCC) 01/19/2017   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   Left knee pain 05/15/2016   Flank pain 04/21/2016   Abdominal pain 02/28/2016   GERD (gastroesophageal reflux disease) 02/14/2016   Morbid obesity (HCC) 02/14/2016   Tobacco use disorder 08/12/2015   Pulmonary nodule, right 04/11/2015   COPD (chronic obstructive pulmonary disease) (HCC) 01/18/2015   Mediastinal lymphadenopathy 01/18/2015   DOE (dyspnea on exertion) 03/08/2014   Migraine without aura 07/26/2013   Sleep apnea 02/11/2012   Chest discomfort 10/12/2011   Essential hypertension 10/12/2011    Past Surgical History:  Procedure Laterality Date   ABDOMINAL AORTOGRAM W/LOWER EXTREMITY N/A 04/30/2017   Procedure: Abdominal Aortogram w/Lower Extremity;  Surgeon: Sherren Kerns, MD;  Location: [facility] INVASIVE CV LAB;  Service: Cardiovascular;  Laterality: N/A;   CARDIAC CATHETERIZATION  12/28/2007   EF 75%. IT REVEALS NORMAL/SUPRANORMAL LEFT VENTRICULAR SYSTOLIC FUNCTION   CARDIOVASCULAR STRESS TEST  03/25/2007   EF 78%  CESAREAN SECTION  2003, 2009   x 2   CHOLECYSTECTOMY  1993   COLONOSCOPY  2014   ENDOMETRIAL ABLATION  09/2010   and D&C   HERNIA REPAIR  2004   umbilical   LAPAROSCOPY     PERIPHERAL VASCULAR BALLOON ANGIOPLASTY Left 04/30/2017   Procedure: Peripheral Vascular Balloon Angioplasty;  Surgeon: Sherren Kerns, MD;  Location: [facility] INVASIVE CV LAB;  Service: Cardiovascular;  Laterality: Left;  SFA   RECONSTRUCTION OF EYELID Bilateral    TUBAL LIGATION     UMBILICAL HERNIA REPAIR  2004   US  ECHOCARDIOGRAPHY  05/08/2008   EF 55-60%   VENTRAL HERNIA REPAIR  06/15/2012   Procedure: LAPAROSCOPIC VENTRAL HERNIA;  Surgeon: Mariella Saa, MD;  Location: [facility];  Service: General;  Laterality: N/A;  LAPAROSCOPIC REPAIR VENTRAL HERNIA   VIDEO BRONCHOSCOPY Bilateral 11/15/2017   Procedure: VIDEO BRONCHOSCOPY WITH FLUORO;  Surgeon: Leslye Peer, MD;  Location: [facility];  Service: Cardiopulmonary;  Laterality: Bilateral;    OB History     Gravida  4   Para  2   Term  2   Preterm      AB  2   Living  2      SAB      IAB      Ectopic  1   Multiple      Live Births               Home Medications    Prior to Admission medications   Medication Sig Start Date End Date Taking? Authorizing Provider  ondansetron (ZOFRAN-ODT) 4 MG disintegrating tablet Take 1 tablet (4 mg total) by mouth every 8 (eight) hours as needed for nausea or vomiting. 09/21/23  Yes Zenia Resides, MD  allopurinol (ZYLOPRIM) 100 MG tablet TAKE 1 TABLET BY MOUTH EVERY DAY 08/18/23   Rodolph Bong, MD  ASPIRIN 81 PO Take 81 mg by mouth daily.    [provider]  buPROPion (WELLBUTRIN XL) 150 MG 24 hr tablet TAKE 1 TABLET BY MOUTH EVERY DAY 08/18/23   Shelva Majestic, MD  carvedilol (COREG) 25 MG tablet TAKE 1 TABLET BY MOUTH TWICE A DAY 01/06/23   Nahser, Deloris Ping, MD  dicyclomine (BENTYL) 10 MG capsule TAKE 1 CAPSULE (10 MG TOTAL) BY MOUTH 3 (THREE) TIMES DAILY BEFORE MEALS. AS NEEDED 12/14/22   Shelva Majestic, MD  Evolocumab (REPATHA SURECLICK) 140 MG/ML SOAJ Inject 140 mg into the skin every 14 (fourteen) days. 11/09/22   Swinyer, Zachary George, NP  ONE TOUCH LANCETS MISC Use to check blood sugars twice a day 12/20/17   Demetrio Lapping, PA-C  pantoprazole (PROTONIX) 40 MG tablet TAKE 1 TABLET BY MOUTH 30 MINUTES BEFORE BREAKFAST DAILY 08/16/23   Meryl Dare, MD  telmisartan (MICARDIS) 40 MG tablet TAKE 1 TABLET BY MOUTH EVERY DAY 12/14/22   Shelva Majestic, MD  tirzepatide  [facility]) 2.5 MG/0.5ML Pen Inject 2.5 mg into the skin once a week. 09/07/23   Jarold Motto, PA    Family History Family History  Problem Relation Age of Onset   Hypertension Mother    Lung cancer Mother        60   Heart disease Father        pacemaker or defibrillator   Breast cancer Maternal Aunt 78   Heart attack Paternal Uncle    Diabetes Maternal Grandmother    Colon cancer Neg Hx    Esophageal  cancer Neg Hx    Pancreatic cancer Neg Hx     Social History Social History   Tobacco Use   Smoking status: Every Day    Current packs/day: 1.50    Average packs/day: 1.5 packs/day for 38.0 years (57.0 ttl pk-yrs)    Types: Cigarettes   Smokeless tobacco: Never   Tobacco comments:    10 cigarettes daily ARJ 09/10/2020  Vaping Use   Vaping status: Never Used  Substance Use Topics   Alcohol use: No    Alcohol/week: 0.0 standard drinks of alcohol   Drug use: No     Allergies   Almond oil, Apple juice, Ceftin [cefuroxime], Cherry extract, Other, Guaifenesin & derivatives, Hctz [hydrochlorothiazide], Naproxen, and Lisinopril   Review of Systems Review of Systems  Gastrointestinal:  Positive for diarrhea.  Neurological:  Positive for headaches.     Physical Exam Triage Vital Signs ED Triage Vitals  Encounter Vitals Group     BP 09/21/23 1855 (!) 150/85     Systolic BP Percentile --      Diastolic BP Percentile --      Pulse Rate 09/21/23 1855 73     Resp 09/21/23 1855 18     Temp 09/21/23 1855 98.2 F (36.8 C)     Temp Source 09/21/23 1855 Oral     SpO2 09/21/23 1855 94 %     Weight --      Height --      Head Circumference --      Peak Flow --      Pain Score 09/21/23 1857 5     Pain Loc --      Pain Education --      Exclude from Growth Chart --    No data found.  Updated Vital Signs BP (!) 150/85 (BP Location: Left Arm)   Pulse 73   Temp 98.2 F (36.8 C) (Oral)   Resp 18   SpO2 94%   Visual Acuity Right Eye Distance:   Left Eye  Distance:   Bilateral Distance:    Right Eye Near:   Left Eye Near:    Bilateral Near:     Physical Exam Vitals reviewed.  Constitutional:      General: She is not in acute distress.    Appearance: She is not toxic-appearing.  HENT:     Nose: Nose normal.     Mouth/Throat:     Mouth: Mucous membranes are moist.     Pharynx: No oropharyngeal exudate or posterior oropharyngeal erythema.  Eyes:     Extraocular Movements: Extraocular movements intact.     Conjunctiva/sclera: Conjunctivae normal.     Pupils: Pupils are equal, round, and reactive to light.  Cardiovascular:     Rate and Rhythm: Normal rate and regular rhythm.     Heart sounds: No murmur heard. Pulmonary:     Effort: Pulmonary effort is normal.     Breath sounds: Normal breath sounds.  Abdominal:     Palpations: Abdomen is soft.     Tenderness: There is abdominal tenderness (epigastric).  Musculoskeletal:     Cervical back: Neck supple.  Lymphadenopathy:     Cervical: No cervical adenopathy.  Skin:    Coloration: Skin is not jaundiced or pale.  Neurological:     General: No focal deficit present.     Mental Status: She is alert and oriented to person, place, and time.  Psychiatric:        Behavior: Behavior normal.  UC Treatments / Results  Labs (all labs ordered are listed, but only abnormal results are displayed) Labs Reviewed - No data to display  EKG   Radiology No results found.  Procedures Procedures (including critical care time)  Medications Ordered in UC Medications  ondansetron (ZOFRAN-ODT) disintegrating tablet 4 mg (has no administration in time range)    Initial Impression / Assessment and Plan / UC Course  I have reviewed the triage vital signs and the nursing notes.  Pertinent labs & imaging results that were available during my care of the patient were reviewed by me and considered in my medical decision making (see chart for details).     Zofran is given here and a  prescription for Zofran is sent to the pharmacy.  We discussed clear liquids and bland diet.  If she worsens anyway she is to present to the emergency room for further evaluation Final Clinical Impressions(s) / UC Diagnoses   Final diagnoses:  Gastroenteritis     Discharge Instructions      Ondansetron dissolved in the mouth every 8 hours as needed for nausea or vomiting. Clear liquids(water, gatorade/pedialyte, ginger ale/sprite, chicken broth/soup) and bland things(crackers/toast, rice, potato, bananas) to eat. Avoid acidic foods like lemon/lime/orange/tomato, and avoid greasy/spicy foods.  We gave you 1 dose of this medication here in the office  If you worsen in any way, please present to the emergency room for further evaluation     ED Prescriptions     Medication Sig Dispense Auth. Provider   ondansetron (ZOFRAN-ODT) 4 MG disintegrating tablet Take 1 tablet (4 mg total) by mouth every 8 (eight) hours as needed for nausea or vomiting. 10 tablet Marlinda Mike Janace Aris, MD      PDMP not reviewed this encounter.   Zenia Resides, MD 09/21/23 [phone]

## 2023-09-21 NOTE — ED Triage Notes (Signed)
Pt presents with nausea, a headache, and four episodes of diarrhea. Pt reports symptoms began shortly after eating [place] at a restaurant. Pt reports she was SOB earlier although this has now subsided. Pt did take Bentyl @1600  today and aspirin today as well (for headache).

## 2023-10-04 ENCOUNTER — Other Ambulatory Visit: Payer: Self-pay

## 2023-10-04 ENCOUNTER — Other Ambulatory Visit: Payer: Self-pay | Admitting: Gastroenterology

## 2023-10-04 ENCOUNTER — Other Ambulatory Visit: Payer: Self-pay | Admitting: Physician Assistant

## 2023-10-04 MED ORDER — TIRZEPATIDE 2.5 MG/0.5ML ~~LOC~~ SOAJ
2.5000 mg | SUBCUTANEOUS | 0 refills | Status: DC
  Filled 2023-10-22: qty 2, 28d supply, fill #0

## 2023-10-04 NOTE — Telephone Encounter (Signed)
Pt would like a refill on this med. Please advise.

## 2023-10-16 ENCOUNTER — Other Ambulatory Visit (HOSPITAL_COMMUNITY): Payer: Self-pay

## 2023-10-22 ENCOUNTER — Other Ambulatory Visit (HOSPITAL_COMMUNITY): Payer: Self-pay

## 2023-11-28 ENCOUNTER — Other Ambulatory Visit: Payer: Self-pay | Admitting: Nurse Practitioner

## 2023-12-08 ENCOUNTER — Encounter: Payer: Self-pay | Admitting: Family

## 2023-12-08 ENCOUNTER — Ambulatory Visit: Payer: Managed Care, Other (non HMO) | Admitting: Family

## 2023-12-08 VITALS — BP 137/78 | HR 81 | Temp 98.2°F | Ht 66.0 in | Wt 260.1 lb

## 2023-12-08 DIAGNOSIS — J029 Acute pharyngitis, unspecified: Secondary | ICD-10-CM | POA: Diagnosis not present

## 2023-12-08 DIAGNOSIS — R053 Chronic cough: Secondary | ICD-10-CM

## 2023-12-08 LAB — POCT INFLUENZA A/B
Influenza A, POC: NEGATIVE
Influenza B, POC: NEGATIVE

## 2023-12-08 LAB — POCT RAPID STREP A (OFFICE): Rapid Strep A Screen: NEGATIVE

## 2023-12-08 MED ORDER — AZITHROMYCIN 250 MG PO TABS: ORAL_TABLET | ORAL | 0 refills | Status: AC

## 2023-12-08 MED ORDER — LIDOCAINE VISCOUS HCL 2 % MT SOLN: 15.0000 mL | OROMUCOSAL | 0 refills | Status: AC | PRN

## 2023-12-08 MED ORDER — PROMETHAZINE-DM 6.25-15 MG/5ML PO SYRP: 5.0000 mL | ORAL_SOLUTION | Freq: Two times a day (BID) | ORAL | 0 refills | Status: DC | PRN

## 2023-12-08 MED ORDER — ALBUTEROL SULFATE HFA 108 (90 BASE) MCG/ACT IN AERS: 2.0000 | INHALATION_SPRAY | Freq: Four times a day (QID) | RESPIRATORY_TRACT | 0 refills | Status: DC | PRN

## 2023-12-08 NOTE — Progress Notes (Addendum)
 Patient ID: Lisa [name], female    DOB: 06-17-1961, 63 y.o.   MRN: [id number]  Chief Complaint  Patient presents with   Sinus Problem    Pt c/o sore throat, post nasal drip, Cough, headache, hoarseness, sore chest and fatigue. Present for days. Negative for covid Monday.        Discussed the use of AI scribe software for clinical note transcription with the patient, who gave verbal consent to proceed.  History of Present Illness   Lisa [name], a patient with a history of asthma & morbid obesity, presents with URI sx and pleuritic chest pain that extends up to her throat. She describes the pain as intense, likening it to a sharp toothpick scraping her throat. The pain has been progressively worsening since initial sx started 5 days ago,   with the last two to three days being particularly severe. She also reports a cough, which she believes is exacerbating her throat pain. The cough produces yellow sputum from her chest, and she notes a foul taste to the sputum. She also experiences headaches, which she attributes to the coughing. She denies any nasal congestion, but notes occasional drainage. She has not been using any inhalers for her asthma recently, and her breathing has not been significantly affected. She does, however, report occasional chest pain when walking from work to her car, which subsides once she sits down. She has been managing her pain with ibuprofen and cough drops, but these have not provided significant relief.     Assessment & Plan:     Upper Respiratory Infection - Rapid flu neg. Persistent cough, chest pain, and sore throat. Mucus production from chest, not nasal sinuses. No fever. History of asthma. -Prescribe albuterol  inhaler for potential asthma exacerbation. -Sending Promethazine -Dextromethorphan  cough syrup bid prn. -Consider azithromycin  (Z-Pak) to address potential bacterial infection. -Advise use of ibuprofen for pain as needed, and Tylenol  for  headaches. -Increase water intake to 2L qd, add humidifier overnight. -Advise rest and provide work note for absence until the following Monday. -Advised on use & SE of all meds, call office Monday if sx are still not improved.  Asthma - History of asthma, not currently on inhalers. -Prescribe albuterol  inhaler for potential asthma exacerbation related to current upper respiratory infection, use in am and 1 hour prior to bedtime to help prevent cough at night and help with better breathing. -Call office if sx are not improved  Headache - Associated with upper respiratory infection. -Advise use of Tylenol  for headache relief.  Throat Pain - Rapid strep negative. Severe throat pain, exacerbated by coughing. -Prescribed lidocaine  rinse for throat pain relief. -Advise use of 400-600mg   ibuprofen bid for pain as needed for next 3 days only. -Try warm salt water gargles tid.     Subjective:    Outpatient Medications Prior to Visit  Medication Sig Dispense Refill   allopurinol  (ZYLOPRIM ) 100 MG tablet TAKE 1 TABLET BY MOUTH EVERY DAY 90 tablet 1   ASPIRIN  81 PO Take 81 mg by mouth daily.     buPROPion  (WELLBUTRIN  XL) 150 MG 24 hr tablet TAKE 1 TABLET BY MOUTH EVERY DAY 90 tablet 3   carvedilol  (COREG ) 25 MG tablet TAKE 1 TABLET BY MOUTH TWICE A DAY 180 tablet 3   dicyclomine  (BENTYL ) 10 MG capsule TAKE 1 CAPSULE (10 MG TOTAL) BY MOUTH 3 (THREE) TIMES DAILY BEFORE MEALS. AS NEEDED 270 capsule 3   Evolocumab  (REPATHA  SURECLICK) 140 MG/ML SOAJ INJECT 140 MG INTO THE SKIN  EVERY 14 (FOURTEEN) DAYS. 6 mL 3   ondansetron  (ZOFRAN -ODT) 4 MG disintegrating tablet Take 1 tablet (4 mg total) by mouth every 8 (eight) hours as needed for nausea or vomiting. 10 tablet 0   ONE TOUCH LANCETS MISC Use to check blood sugars twice a day 200 each 0   pantoprazole  (PROTONIX ) 40 MG tablet TAKE 1 TABLET BY MOUTH 30 MINUTES BEFORE BREAKFAST DAILY 90 tablet 0   telmisartan  (MICARDIS ) 40 MG tablet TAKE 1 TABLET BY  MOUTH EVERY DAY 90 tablet 3   tirzepatide  (MOUNJARO ) 2.5 MG/0.5ML Pen Inject 2.5 mg into the skin once a week. 2 mL 0   No facility-administered medications prior to visit.   Past Medical History:  Diagnosis Date   Allergic rhinitis    Arthritis    Asthma    Breast nodule    right breast, to see dr mat 06-20-2012 for    Chest pain, non-cardiac    Chronic headaches    Cigarette smoker    Complication of anesthesia 05-17-2012   trouble breathing after colonscopy, needed nebulizer   COPD (chronic obstructive pulmonary disease) (HCC)    Diastolic congestive heart failure (HCC) 04/17/2019   Diverticulosis    GERD (gastroesophageal reflux disease)    Hepatic hemangioma    Hypertension    IBS (irritable bowel syndrome)    Nuclear stress test    Myoview  05/2019: EF 63, no ischemia. Low Risk   Obesity    Prosthetic eye globe    Pyloric erosion    Sleep apnea    CPAP setting varies from 4-10   Type 2 diabetes mellitus (HCC) 03/11/2017   Past Surgical History:  Procedure Laterality Date   ABDOMINAL AORTOGRAM W/LOWER EXTREMITY N/A 04/30/2017   Procedure: Abdominal Aortogram w/Lower Extremity;  Surgeon: Harvey Carlin BRAVO, MD;  Location: [facility] INVASIVE CV LAB;  Service: Cardiovascular;  Laterality: N/A;   CARDIAC CATHETERIZATION  12/28/2007   EF 75%. IT REVEALS NORMAL/SUPRANORMAL LEFT VENTRICULAR SYSTOLIC FUNCTION   CARDIOVASCULAR STRESS TEST  03/25/2007   EF 78%   CESAREAN SECTION  2003, 2009   x 2   CHOLECYSTECTOMY  1993   COLONOSCOPY  2014   ENDOMETRIAL ABLATION  09/2010   and D&C   HERNIA REPAIR  2004   umbilical   LAPAROSCOPY     PERIPHERAL VASCULAR BALLOON ANGIOPLASTY Left 04/30/2017   Procedure: Peripheral Vascular Balloon Angioplasty;  Surgeon: Harvey Carlin BRAVO, MD;  Location: [facility] INVASIVE CV LAB;  Service: Cardiovascular;  Laterality: Left;  SFA   RECONSTRUCTION OF EYELID Bilateral    TUBAL LIGATION     UMBILICAL HERNIA REPAIR  2004   US  ECHOCARDIOGRAPHY  05/08/2008   EF 55-60%    VENTRAL HERNIA REPAIR  06/15/2012   Procedure: LAPAROSCOPIC VENTRAL HERNIA;  Surgeon: Morene ONEIDA Olives, MD;  Location: [facility] ORS;  Service: General;  Laterality: N/A;  LAPAROSCOPIC REPAIR VENTRAL HERNIA   VIDEO BRONCHOSCOPY Bilateral 11/15/2017   Procedure: VIDEO BRONCHOSCOPY WITH FLUORO;  Surgeon: Shelah Lamar RAMAN, MD;  Location: [facility] ENDOSCOPY;  Service: Cardiopulmonary;  Laterality: Bilateral;   Allergies  Allergen Reactions   Almond Oil Anaphylaxis and Nausea And Vomiting    Almond-Nuts Only   Apple Juice Anaphylaxis   Ceftin  [Cefuroxime ] Other (See Comments)    Severe stomach pain - Diverticulitis flares up   Cherry Extract Anaphylaxis    Cherry fruit only    Other Anaphylaxis and Itching    Most fruits- Are NOT tolerated (PLEASE ASK PATIENT BEFORE GIVING, AS CERTAIN FRUITS  ARE TOLERATED IN LIMITED QUANTITIES!!)   Guaifenesin  & Derivatives Other (See Comments)    Restlessness, jittery   Hctz [Hydrochlorothiazide] Other (See Comments)    Causes a headache   Naproxen Other (See Comments)    Insomnia   Lisinopril  Cough           Objective:    Physical Exam Vitals and nursing note reviewed.  Constitutional:      Appearance: Normal appearance. She is morbidly obese.  Cardiovascular:     Rate and Rhythm: Normal rate and regular rhythm.  Pulmonary:     Effort: Pulmonary effort is normal.     Breath sounds: Examination of the right-upper field reveals wheezing. Examination of the left-upper field reveals wheezing. Examination of the right-middle field reveals wheezing. Examination of the left-middle field reveals wheezing. Wheezing (mild) present.  Musculoskeletal:        General: Normal range of motion.  Skin:    General: Skin is warm and dry.  Neurological:     Mental Status: She is alert.  Psychiatric:        Mood and Affect: Mood normal.        Behavior: Behavior normal.    BP 137/78 (BP Location: Left Arm, Patient Position: Sitting, Cuff Size: Normal)   Pulse 81    Temp 98.2 F (36.8 C) (Temporal)   Ht 5' 6 (1.676 m)   Wt 260 lb 2 oz (118 kg)   SpO2 99%   BMI 41.99 kg/m  Wt Readings from Last 3 Encounters:  12/08/23 260 lb 2 oz (118 kg)  09/07/23 255 lb (115.7 kg)  07/16/23 264 lb (119.7 kg)      Lisa Krabbe, NP

## 2023-12-14 ENCOUNTER — Encounter: Payer: Self-pay | Admitting: Family Medicine

## 2023-12-14 ENCOUNTER — Ambulatory Visit (INDEPENDENT_AMBULATORY_CARE_PROVIDER_SITE_OTHER): Payer: Managed Care, Other (non HMO) | Admitting: Family Medicine

## 2023-12-14 VITALS — BP 120/76 | HR 73 | Temp 98.7°F | Ht 66.0 in | Wt 260.8 lb

## 2023-12-14 DIAGNOSIS — Z1231 Encounter for screening mammogram for malignant neoplasm of breast: Secondary | ICD-10-CM | POA: Diagnosis not present

## 2023-12-14 DIAGNOSIS — L03319 Cellulitis of trunk, unspecified: Secondary | ICD-10-CM

## 2023-12-14 MED ORDER — DOXYCYCLINE HYCLATE 100 MG PO TABS: 100.0000 mg | ORAL_TABLET | Freq: Two times a day (BID) | ORAL | 0 refills | Status: DC

## 2023-12-14 NOTE — Patient Instructions (Signed)
 I have sent in a prescription for doxycycline  for you to take twice a day for 7 days.  Take this medication with food as it can upset your stomach if you do not.  I have ordered a mammogram for you.  You may call the breast center and set up an appointment for a time that works for you.  Follow-up with me for new or worsening symptoms.

## 2023-12-14 NOTE — Progress Notes (Signed)
   Acute Office Visit  Subjective:     Patient ID: Lisa [name], female    DOB: [birth year], 63 y.o.   MRN: [id number]  Chief Complaint  Patient presents with   Breast Problem    Nodule on right nipple since at least the last week. Patient noted of slight buzzing within right breast (Wednesday) that developed into a pain the night after (Thursday). Notes of redness. Treating with lidocaine , motrin, hot compresses. 1-2 centimeters in diameter    HPI Patient is in today for evaluation of right breast and nipple pain. Reports that this has been going on for the last week. Reports that she is experiencing exquisite pain, states this has happened in the past about 6 or 7 years ago.  States that it was a skin infection. Denies any discharge from the nipple, denies any other breast abnormality, denies any left breast abnormality. Denies any abdominal pain, nausea, vomiting, diarrhea, rash, fever, chills, headaches, other concerns.  Last mammogram was normal in 2023.  Requesting referral for mammo today  ROS Per HPI      Objective:    BP 120/76   Pulse 73   Temp 98.7 F (37.1 C)   Ht 5' 6 (1.676 m)   Wt 260 lb 12.8 oz (118.3 kg)   SpO2 96%   BMI 42.09 kg/m    Physical Exam Vitals and nursing note reviewed.  Constitutional:      General: She is not in acute distress.    Appearance: Normal appearance. She is obese.  HENT:     Head: Normocephalic and atraumatic.  Eyes:     Extraocular Movements: Extraocular movements intact.     Pupils: Pupils are equal, round, and reactive to light.  Pulmonary:     Effort: Pulmonary effort is normal.  Chest:  Breasts:    Right: Swelling and tenderness present.       Comments: Area of erythema, exquisite tenderness.  No discharge noted, no bleeding noted Musculoskeletal:        General: Normal range of motion.     Cervical back: Normal range of motion.  Neurological:     General: No focal deficit present.     Mental Status: She  is alert and oriented to person, place, and time.  Psychiatric:        Mood and Affect: Mood normal.        Thought Content: Thought content normal.    No results found for any visits on 12/14/23.      Assessment & Plan:  1. Cellulitis of trunk, unspecified site of trunk (Primary)  - doxycycline  (VIBRA -TABS) 100 MG tablet; Take 1 tablet (100 mg total) by mouth 2 (two) times daily.  Dispense: 14 tablet; Refill: 0  2. Encounter for screening mammogram for malignant neoplasm of breast  - MM 3D SCREENING MAMMOGRAM BILATERAL BREAST; Future   Meds ordered this encounter  Medications   doxycycline  (VIBRA -TABS) 100 MG tablet    Sig: Take 1 tablet (100 mg total) by mouth 2 (two) times daily.    Dispense:  14 tablet    Refill:  0    Return if symptoms worsen or fail to improve.  Corean Ku, FNP

## 2023-12-16 ENCOUNTER — Ambulatory Visit: Payer: Self-pay | Admitting: Family Medicine

## 2023-12-16 NOTE — Telephone Encounter (Signed)
Copied from CRM [id number]. Topic: Clinical - Red Word Triage >> Dec 16, 2023 12:16 PM Almira Coaster wrote: Red Word that prompted transfer to Nurse Triage: Patient was seen on 12/14/2023 due to a cyst on the right breast, the cyst has burst and the bleeding won't stop.   Chief Complaint: Bleeding from Cyst Symptoms: Redness, Tenderness Frequency: Acute Pertinent Negatives: Patient denies Fever Disposition: [] ED /[] Urgent Care (no appt availability in office) / [] Appointment(In office/virtual)/ []  [facility]/ [x] Home Care/ [] Refused Recommended Disposition /[] [facility]/ []  Follow-up with PCP Additional Notes: Lisa [name] is a 63 year old female being triaged for a bleeding from a cyst on her nipple that has seemed to burst. The patient states the bleeding is persistent, but not severe. Provided education regarding home care and mechanisms of Aspirin on the bleeding from her cyst. Instructed the patient on home care, and if symptoms did not improve or worsened, to call back.   Answer Assessment - Initial Assessment Questions 1. SYMPTOM: "What's the main symptom you're concerned about?"  (e.g., lump, pain, rash, nipple discharge)     Bleeding, Cyst  2. LOCATION: "Where is the Cyst located?"     Right  3. ONSET: "When did bleeding  start?"     This morning  4. PRIOR HISTORY: "Do you have any history of prior problems with your breasts?" (e.g., lumps, cancer, fibrocystic breast disease)     Doxycycline  5. CAUSE: "What do you think is causing this symptom?"     Burst Cyst  6. OTHER SYMPTOMS: "Do you have any other symptoms?" (e.g., fever, breast pain, redness or rash, nipple discharge)     Tender around Cyst, Redness  7. PREGNANCY-BREASTFEEDING: "Is there any chance you are pregnant?" "When was your last menstrual period?" "Are you breastfeeding?"     No  Protocols used: Breast Symptoms-A-AH

## 2023-12-16 NOTE — Telephone Encounter (Signed)
Please call and schedule follow up ov for pt for this if she is still having issues.

## 2023-12-23 ENCOUNTER — Encounter: Payer: Self-pay | Admitting: Family Medicine

## 2023-12-23 ENCOUNTER — Ambulatory Visit: Payer: Managed Care, Other (non HMO) | Admitting: Family Medicine

## 2023-12-23 VITALS — BP 132/76 | HR 85 | Temp 97.2°F | Ht 66.0 in | Wt 259.8 lb

## 2023-12-23 DIAGNOSIS — E785 Hyperlipidemia, unspecified: Secondary | ICD-10-CM

## 2023-12-23 DIAGNOSIS — I10 Essential (primary) hypertension: Secondary | ICD-10-CM | POA: Diagnosis not present

## 2023-12-23 DIAGNOSIS — Z6841 Body Mass Index (BMI) 40.0 and over, adult: Secondary | ICD-10-CM

## 2023-12-23 DIAGNOSIS — Z7985 Long-term (current) use of injectable non-insulin antidiabetic drugs: Secondary | ICD-10-CM

## 2023-12-23 DIAGNOSIS — E1122 Type 2 diabetes mellitus with diabetic chronic kidney disease: Secondary | ICD-10-CM | POA: Diagnosis not present

## 2023-12-23 DIAGNOSIS — E1169 Type 2 diabetes mellitus with other specified complication: Secondary | ICD-10-CM

## 2023-12-23 DIAGNOSIS — N1831 Chronic kidney disease, stage 3a: Secondary | ICD-10-CM

## 2023-12-23 MED ORDER — TIRZEPATIDE 2.5 MG/0.5ML ~~LOC~~ SOAJ: 2.5000 mg | SUBCUTANEOUS | 0 refills | Status: DC

## 2023-12-23 NOTE — Patient Instructions (Addendum)
Nipple looks much better- glad you finished antibiotics- follow up with mammogram  Restart mounjaro  Try monistat 3 - call us back if not effective. Vagisil may help itch  [facility] [address] [place], [place] [address] [phone]  Schedule a lab visit at the check out desk within 2 weeks. Return for future fasting labs meaning nothing but water after midnight please. Ok to take your medications with water.   Recommended follow up: Return in about 4 months (around 04/21/2024) for physical or sooner if needed.Schedule b4 you leave.

## 2023-12-23 NOTE — Progress Notes (Signed)
Phone [phone] In person visit   Subjective:   Lisa [name] is a 63 y.o. year old very pleasant female patient who presents for/with See problem oriented charting Chief Complaint  Patient presents with   bleeding cyst    Pt c/o cyst on nipple that has gotten better,the scab came off Monday.   Vaginal Itching    Pt c/o vaginal itching that started Sunday.    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    Priority: High   Morbid obesity (HCC) 02/14/2016    Priority: High   Tobacco use disorder 08/12/2015    Priority: High   COPD (chronic obstructive pulmonary disease) (HCC) 01/18/2015    Priority: High   Anxiety 03/24/2022    Priority: Medium    Aortic atherosclerosis (HCC) 09/10/2021    Priority: Medium    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    Priority: Medium    CKD (chronic kidney disease), stage III (HCC) 09/18/2020    Priority: Medium    Fatty liver 11/06/2019    Priority: Medium    Hyperlipidemia 04/12/2018    Priority: Medium    Hyperlipidemia associated with type 2 diabetes mellitus (HCC) 09/06/2017    Priority: Medium    Breast mass 08/03/2017    Priority: Medium    GERD (gastroesophageal reflux disease) 02/14/2016    Priority: Medium    DOE (dyspnea on exertion) 03/08/2014    Priority: Medium    Migraine without aura 07/26/2013    Priority: Medium    Sleep apnea 02/11/2012    Priority: Medium    Chest discomfort 10/12/2011    Priority: Medium    Essential hypertension 10/12/2011    Priority: Medium    Chronic rhinitis 06/26/2019    Priority: Low   Trigger finger, right  middle finger 05/04/2019    Priority: Low   Bilateral leg pain 04/14/2018    Priority: Low   S/P bronchoscopy with biopsy     Priority: Low   Chronic hip pain 07/29/2017    Priority: Low   Left knee pain 05/15/2016    Priority: Low   Flank pain 04/21/2016    Priority: Low   Abdominal pain 02/28/2016    Priority: Low   Pulmonary nodule, right 04/11/2015    Priority: Low   Mediastinal lymphadenopathy 01/18/2015    Priority: Low   Multinodular goiter 06/10/2022   Type 2 diabetes mellitus with stage 3a chronic kidney disease, without long-term current use of insulin (HCC) 06/10/2022    Medications- reviewed and updated Current Outpatient Medications  Medication Sig Dispense Refill   albuterol (VENTOLIN HFA) 108 (90 Base) MCG/ACT inhaler Inhale 2 puffs into the lungs every 6 (six) hours as needed for wheezing or shortness of breath (or cough.). 8 g 0   allopurinol (ZYLOPRIM) 100 MG tablet TAKE 1 TABLET BY MOUTH EVERY DAY 90 tablet 1   ASPIRIN 81 PO Take 81 mg by mouth daily.     buPROPion (WELLBUTRIN XL) 150 MG 24 hr tablet TAKE 1 TABLET BY MOUTH EVERY DAY 90 tablet 3   carvedilol (COREG) 25 MG tablet TAKE 1 TABLET BY MOUTH TWICE  A DAY 180 tablet 3   dicyclomine (BENTYL) 10 MG capsule TAKE 1 CAPSULE (10 MG TOTAL) BY MOUTH 3 (THREE) TIMES DAILY BEFORE MEALS. AS NEEDED 270 capsule 3   Evolocumab (REPATHA SURECLICK) 140 MG/ML SOAJ INJECT 140 MG INTO THE SKIN EVERY 14 (FOURTEEN) DAYS. 6 mL 3   lidocaine (XYLOCAINE) 2 % solution Use as directed 15 mLs in the mouth or throat every 3 (three) hours as needed for mouth pain (gargle; may spit or swallow). 100 mL 0   ONE TOUCH LANCETS MISC Use to check blood sugars twice a day 200 each 0   pantoprazole (PROTONIX) 40 MG tablet TAKE 1 TABLET BY MOUTH 30 MINUTES BEFORE BREAKFAST DAILY 90 tablet 0   telmisartan (MICARDIS) 40 MG tablet TAKE 1 TABLET BY MOUTH EVERY DAY 90 tablet 3   ondansetron (ZOFRAN-ODT) 4 MG disintegrating tablet Take 1 tablet (4  mg total) by mouth every 8 (eight) hours as needed for nausea or vomiting. (Patient not taking: Reported on 12/23/2023) 10 tablet 0   tirzepatide (MOUNJARO) 2.5 MG/0.5ML Pen Inject 2.5 mg into the skin once a week. 2 mL 0   No current facility-administered medications for this visit.     Objective:  BP 132/76   Pulse 85   Temp (!) 97.2 F (36.2 C)   Ht 5\' 6"  (1.676 m)   Wt 259 lb 12.8 oz (117.8 kg)   SpO2 98%   BMI 41.93 kg/m  Gen: NAD, resting comfortably CV: RRR no murmurs rubs or gallops Lungs: CTAB no crackles, wheeze, rhonchi Right nipple without warmth tenderness or redness.  Slight hypertrophy of tissues on the right side that point of prior reported cyst-patient reports looks much better Ext: no edema Skin: warm, dry     Assessment and Plan   # Nipple irritation S: Patient was seen by staff Ashley Royalty, FNP with [facility] on 12/14/2023 complaining of a nodule for at least a week with a buzzing sensation on her right nipple associated with some redness and worsening pain.  She was started on doxycycline for potential cellulitis and referred for mammogram.  Today patient reports this has been getting better and better and had a scab fall off on Monday.  She reports she is still scheduled for mammogram A/P: Nipple irritation/recent cellulitis-significantly improved today-encouraged her to continue to monitor-follow-up if worsens and make sure to keep her mammogram  # vaginal itching S:started itching Sunday. No discharge yet.  Mild odor.  A/P: Offered vaginal exam that she wants to decline at this time.  She wants to try Monistat and Vagisil and follow-up if not improving-treating possible yeast infection  #hypertension S: medication: telmisartan 40 mg and carvedilol 25 mg twice daily BP Readings from Last 3 Encounters:  12/23/23 132/76  12/14/23 120/76  12/08/23 137/78  A/P: Controlled. Continue current medications.  # Diabetes- seen by Dr. Lonzo Cloud S:  Medication: started on Mounjaro through [facility] and cheaper than her typical pharmacy.  Has been taking but now off for 2-3 weeks CBGs- sugars were high with respiratory illness about 2 weeks ago when she saw Dulce Sellar- got as high as 279 but usually under 150 Exercise and diet- fountain sodas every other day- encouraged to stop - was on daily  Lab Results  Component Value Date   HGBA1C 9.6 (H) 09/07/2023   HGBA1C 7.9 (A) 07/16/2023   HGBA1C 7.3 (H) 02/16/2023   A/P: Poor control of A1c last visit but reports sugars have been improving on  Mounjaro 2.5 mg.  She is out of this and needs a refill which I did today.  She agrees to come back for fasting labs -After visit realized she also has upcoming visit with endocrinology in mid February which will be great opportunity to chat about A1c as well-hopefully we can promote some weight loss as well -Morbid obesity status noted-we discussed importance of weight loss-we are hoping Greggory Keen will be helpful and also needs to work on her diet particularly her soda intake as discussed today  #hyperlipidemia S: Medication: Repatha 140 mg every 2 weeks Lab Results  Component Value Date   CHOL 208 (H) 11/06/2022   HDL 31 (L) 11/06/2022   LDLCALC 148 (H) 11/06/2022   LDLDIRECT 144.0 03/24/2022   TRIG 158 (H) 11/06/2022   CHOLHDL 6.7 (H) 11/06/2022   A/P: Poor control on last check with LDL at 144 and triglycerides above 150 in total over 200-hoping for significant improvement on Repatha  #Tobacco- 1 ppd- advised cessation again today  Recommended follow up: Return in about 4 months (around 04/21/2024) for physical or sooner if needed.Schedule b4 you leave. Future Appointments  Date Time Provider Department Center  01/06/2024  9:15 AM [facility]  01/07/2024  5:10 PM [facility] MM 3 [facility]  01/17/2024  2:40 PM Shamleffer, Konrad Dolores, MD [facility] None  06/15/2024  2:20 PM Durene Cal Aldine Contes, MD [facility]     Lab/Order associations:   ICD-10-CM   1. Essential hypertension  I10     2. Morbid obesity (HCC) Chronic E66.01     3. Type 2 diabetes mellitus with stage 3a chronic kidney disease, without long-term current use of insulin (HCC) Chronic E11.22 Hemoglobin A1c   N18.31 Comprehensive metabolic panel    Urinalysis, Routine w reflex microscopic    Lipid panel    4. Hyperlipidemia associated with type 2 diabetes mellitus (HCC)  E11.69    E78.5       Meds ordered this encounter  Medications   tirzepatide (MOUNJARO) 2.5 MG/0.5ML Pen    Sig: Inject 2.5 mg into the skin once a week.    Dispense:  2 mL    Refill:  0    Return precautions advised.  Tana Conch, MD

## 2023-12-29 ENCOUNTER — Other Ambulatory Visit: Payer: Self-pay | Admitting: *Deleted

## 2023-12-29 ENCOUNTER — Encounter: Payer: Self-pay | Admitting: *Deleted

## 2023-12-29 ENCOUNTER — Ambulatory Visit: Payer: Self-pay | Admitting: Family Medicine

## 2023-12-29 ENCOUNTER — Other Ambulatory Visit (HOSPITAL_COMMUNITY): Payer: Self-pay

## 2023-12-29 MED ORDER — GLIMEPIRIDE 2 MG PO TABS: 2.0000 mg | ORAL_TABLET | Freq: Every day | ORAL | 0 refills | Status: DC

## 2023-12-29 NOTE — Telephone Encounter (Signed)
Spoke to patient and she stated, that she has been sick for last 3 weeks, has taking antibiotics. She stated that the pharmacy sent a message saying Greggory Keen would be ready tomorrow. Glimepiride sent to the pharmacy. Patient questioned if it was okay to take with the [facility] when it come in.

## 2023-12-29 NOTE — Telephone Encounter (Signed)
Patient stated the she isn't sure why she couldn't get medication for 2 weeks, could be insurance, could be money and maybe that is the reason blood sugar has been running high, due to being out of medication.

## 2023-12-29 NOTE — Telephone Encounter (Signed)
Copied from CRM [id number]. Topic: Clinical - Pink Word Triage >> Dec 29, 2023  2:58 PM Rodman Pickle T wrote: Reason for Triage: PATIENT BLOOD SUGARS LEVELS ARE UP AND DOWN BETWEEN 300-400 SHE STATED HER VISION IS BLURRY AS WELL AND SHE DOESN'T FEEL WELL  Chief Complaint:  Patient report her blood sugars have been  going up and down over the last couple of days. Ranges have been  from 300-400, Last blood sugar was 439  then it went down  to 372. Patent reports  some blurry vision and feeling Loopy at times. Patient denies any issues of feeling sick. Patient believes her  Blood Sugar has been increasing due to her  being with out of her medication ( Monjuaro) for the last two weeks. Patient  requesting  samples of the Monjuaro. Symptoms:  blurry vision and loopy Frequency: x 3 Pertinent Negatives: Patient denies  any  Disposition: [] ED /[] Urgent Care (no appt availability in office) / [] Appointment(In office/virtual)/ []  [facility]/ [] Home Care/ [] Refused Recommended Disposition /[] [facility]/ [x]  Follow-up with PCP Additional Notes:  Recommendation Call PCP. No Virtual  visits available with Urgent Care or Providers.  RN Contacted the Provider office. She will discuss with provider in the office. Answer Assessment - Initial Assessment Questions 1. BLOOD GLUCOSE: "What is your blood glucose level?"       Blood Sugar 2. ONSET: "When did you check the blood glucose?"      Checked  it this morning 3. USUAL RANGE: "What is your glucose level usually?" (e.g., usual fasting morning value, usual evening value)        Yesterday 4. KETONES: "Do you check for ketones (urine or blood test strips)?" If Yes, ask: "What does the test show now?"   Never done this before.      5. TYPE 1 or 2:  "Do you know what type of diabetes you have?"  (e.g., Type 1, Type 2, Gestational; doesn't know)      Type 2  6. INSULIN: "Do you take insulin?" "What type of insulin(s) do you use? What is the mode of  delivery? (syringe, pen; injection or pump)?"       Controled by Diet and Exercise- Take Monjuoaro but have not been able to get it lately  been out for 2- 21/2 weeks 7. DIABETES PILLS: "Do you take any pills for your diabetes?" If Yes, ask: "Have you missed taking any pills recently?"      Monjaura is the only  8. OTHER SYMPTOMS: "Do you have any symptoms?" (e.g., fever, frequent urination, difficulty breathing, dizziness, weakness, vomiting)      Increase  urination, feel foggy, vision a little blurred , feeling goofy and in crease sleepiness 9. PREGNANCY: "Is there any chance you are pregnant?" "When was your last menstrual period?"     N/a  Protocols used: Diabetes - High Blood Sugar-A-AH  Reason for Disposition  Blood glucose > 400 mg/dL (59.5 mmol/L)  Answer Assessment - Initial Assessment Questions 1. BLOOD GLUCOSE: "What is your blood glucose level?"       Blood Sugar 2. ONSET: "When did you check the blood glucose?"      Checked  it this morning 3. USUAL RANGE: "What is your glucose level usually?" (e.g., usual fasting morning value, usual evening value)        Yesterday 4. KETONES: "Do you check for ketones (urine or blood test strips)?" If Yes, ask: "What does the test show now?"  Never done this before.      5. TYPE 1 or 2:  "Do you know what type of diabetes you have?"  (e.g., Type 1, Type 2, Gestational; doesn't know)      Type 2  6. INSULIN: "Do you take insulin?" "What type of insulin(s) do you use? What is the mode of delivery? (syringe, pen; injection or pump)?"       Controled by Diet and Exercise- Take Monjuoaro but have not been able to get it lately  been out for 2- 21/2 weeks 7. DIABETES PILLS: "Do you take any pills for your diabetes?" If Yes, ask: "Have you missed taking any pills recently?"      Monjaura is the only  8. OTHER SYMPTOMS: "Do you have any symptoms?" (e.g., fever, frequent urination, difficulty breathing, dizziness, weakness, vomiting)       Increase  urination, feel foggy, vision a little blurred , feeling goofy and in crease sleepiness 9. PREGNANCY: "Is there any chance you are pregnant?" "When was your last menstrual period?"     N/a  Protocols used: Diabetes - High Blood Sugar-A-AH  Reason for Disposition  Blood glucose > 400 mg/dL (56.2 mmol/L)  [1] Blood glucose > 300 mg/dL (30.8 mmol/L) AND [6] two or more times in a row  Answer Assessment - Initial Assessment Questions 1. BLOOD GLUCOSE: "What is your blood glucose level?"       Blood Sugar 2. ONSET: "When did you check the blood glucose?"      Checked  it this morning 3. USUAL RANGE: "What is your glucose level usually?" (e.g., usual fasting morning value, usual evening value)        Yesterday 4. KETONES: "Do you check for ketones (urine or blood test strips)?" If Yes, ask: "What does the test show now?"   Never done this before.      5. TYPE 1 or 2:  "Do you know what type of diabetes you have?"  (e.g., Type 1, Type 2, Gestational; doesn't know)      Type 2  6. INSULIN: "Do you take insulin?" "What type of insulin(s) do you use? What is the mode of delivery? (syringe, pen; injection or pump)?"       Controled by Diet and Exercise- Take Monjuoaro but have not been able to get it lately  been out for 2- 21/2 weeks 7. DIABETES PILLS: "Do you take any pills for your diabetes?" If Yes, ask: "Have you missed taking any pills recently?"      Monjaura is the only  8. OTHER SYMPTOMS: "Do you have any symptoms?" (e.g., fever, frequent urination, difficulty breathing, dizziness, weakness, vomiting)      Increase  urination, feel foggy, vision a little blurred , feeling goofy and in crease sleepiness 9. PREGNANCY: "Is there any chance you are pregnant?" "When was your last menstrual period?"     N/a  Protocols used: Diabetes - High Blood Sugar-A-AH  Reason for Disposition  Blood glucose > 400 mg/dL (57.8 mmol/L)  [4] Blood glucose > 300 mg/dL (69.6 mmol/L) AND [2] two or  more times in a row  [1] Symptoms of high blood sugar (e.g., abnormally thirsty, frequent urination, weight loss) AND [2] not able to test blood glucose AND [3] pregnant  Answer Assessment - Initial Assessment Questions 1. BLOOD GLUCOSE: "What is your blood glucose level?"       Blood Sugar 2. ONSET: "When did you check the blood glucose?"      Checked  it this morning 3. USUAL RANGE: "What is your glucose level usually?" (e.g., usual fasting morning value, usual evening value)        Yesterday 4. KETONES: "Do you check for ketones (urine or blood test strips)?" If Yes, ask: "What does the test show now?"   Never done this before.      5. TYPE 1 or 2:  "Do you know what type of diabetes you have?"  (e.g., Type 1, Type 2, Gestational; doesn't know)      Type 2  6. INSULIN: "Do you take insulin?" "What type of insulin(s) do you use? What is the mode of delivery? (syringe, pen; injection or pump)?"       Controled by Diet and Exercise- Take Monjuoaro but have not been able to get it lately  been out for 2- 21/2 weeks 7. DIABETES PILLS: "Do you take any pills for your diabetes?" If Yes, ask: "Have you missed taking any pills recently?"      Monjaura is the only  8. OTHER SYMPTOMS: "Do you have any symptoms?" (e.g., fever, frequent urination, difficulty breathing, dizziness, weakness, vomiting)      Increase  urination, feel foggy, vision a little blurred , feeling goofy and in crease sleepiness 9. PREGNANCY: "Is there any chance you are pregnant?" "When was your last menstrual period?"     N/a  Protocols used: Diabetes - High Blood Sugar-A-AH

## 2023-12-29 NOTE — Telephone Encounter (Signed)
Patient notified of message below.

## 2023-12-29 NOTE — Telephone Encounter (Signed)
Please see message below and advise. PCP gone for the day.

## 2024-01-06 ENCOUNTER — Encounter: Payer: Self-pay | Admitting: Family Medicine

## 2024-01-06 ENCOUNTER — Other Ambulatory Visit: Payer: Managed Care, Other (non HMO)

## 2024-01-06 DIAGNOSIS — N1831 Chronic kidney disease, stage 3a: Secondary | ICD-10-CM | POA: Diagnosis not present

## 2024-01-06 DIAGNOSIS — E1122 Type 2 diabetes mellitus with diabetic chronic kidney disease: Secondary | ICD-10-CM | POA: Diagnosis not present

## 2024-01-06 LAB — URINALYSIS, ROUTINE W REFLEX MICROSCOPIC
Bilirubin Urine: NEGATIVE
Ketones, ur: NEGATIVE
Leukocytes,Ua: NEGATIVE
Nitrite: NEGATIVE
Specific Gravity, Urine: 1.02 (ref 1.000–1.030)
Urine Glucose: NEGATIVE
Urobilinogen, UA: 0.2 (ref 0.0–1.0)
pH: 6 (ref 5.0–8.0)

## 2024-01-06 LAB — COMPREHENSIVE METABOLIC PANEL
ALT: 14 U/L (ref 0–35)
AST: 11 U/L (ref 0–37)
Albumin: 3.9 g/dL (ref 3.5–5.2)
Alkaline Phosphatase: 89 U/L (ref 39–117)
BUN: 22 mg/dL (ref 6–23)
CO2: 24 meq/L (ref 19–32)
Calcium: 9.8 mg/dL (ref 8.4–10.5)
Chloride: 104 meq/L (ref 96–112)
Creatinine, Ser: 1.1 mg/dL (ref 0.40–1.20)
GFR: 54.69 mL/min — ABNORMAL LOW (ref >60.00)
Glucose, Bld: 227 mg/dL — ABNORMAL HIGH (ref 70–99)
Potassium: 3.7 meq/L (ref 3.5–5.1)
Sodium: 137 meq/L (ref 135–145)
Total Bilirubin: 0.5 mg/dL (ref 0.2–1.2)
Total Protein: 7.3 g/dL (ref 6.0–8.3)

## 2024-01-06 LAB — LIPID PANEL
Cholesterol: 108 mg/dL (ref 0–200)
HDL: 27.7 mg/dL — ABNORMAL LOW (ref >39.00)
LDL Cholesterol: 40 mg/dL (ref 0–99)
NonHDL: 80.61
Total CHOL/HDL Ratio: 4
Triglycerides: 204 mg/dL — ABNORMAL HIGH (ref 0.0–149.0)
VLDL: 40.8 mg/dL — ABNORMAL HIGH (ref 0.0–40.0)

## 2024-01-06 LAB — HEMOGLOBIN A1C: Hgb A1c MFr Bld: 10.5 % — ABNORMAL HIGH (ref 4.6–6.5)

## 2024-01-07 ENCOUNTER — Ambulatory Visit
Admission: RE | Admit: 2024-01-07 | Discharge: 2024-01-07 | Disposition: A | Discharge status: Home / Self Care | Payer: Managed Care, Other (non HMO) | Source: Ambulatory Visit | Attending: Family Medicine | Admitting: Family Medicine

## 2024-01-07 DIAGNOSIS — Z1231 Encounter for screening mammogram for malignant neoplasm of breast: Secondary | ICD-10-CM

## 2024-01-11 ENCOUNTER — Other Ambulatory Visit: Payer: Self-pay | Admitting: Family Medicine

## 2024-01-13 ENCOUNTER — Emergency Department (HOSPITAL_BASED_OUTPATIENT_CLINIC_OR_DEPARTMENT_OTHER): Payer: Managed Care, Other (non HMO)

## 2024-01-13 ENCOUNTER — Ambulatory Visit
Admission: EM | Admit: 2024-01-13 | Discharge: 2024-01-13 | Disposition: A | Discharge status: Home / Self Care | Payer: Managed Care, Other (non HMO)

## 2024-01-13 ENCOUNTER — Encounter (HOSPITAL_BASED_OUTPATIENT_CLINIC_OR_DEPARTMENT_OTHER): Payer: Self-pay | Admitting: Emergency Medicine

## 2024-01-13 ENCOUNTER — Other Ambulatory Visit: Payer: Self-pay

## 2024-01-13 ENCOUNTER — Telehealth: Payer: Self-pay | Admitting: Gastroenterology

## 2024-01-13 ENCOUNTER — Encounter: Payer: Self-pay | Admitting: *Deleted

## 2024-01-13 ENCOUNTER — Inpatient Hospital Stay (HOSPITAL_BASED_OUTPATIENT_CLINIC_OR_DEPARTMENT_OTHER)
Admission: EM | Admit: 2024-01-13 | Discharge: 2024-01-16 | DRG: 389 | Disposition: A | Discharge status: Home / Self Care | Payer: Managed Care, Other (non HMO) | Attending: Family Medicine | Admitting: Family Medicine

## 2024-01-13 DIAGNOSIS — G4733 Obstructive sleep apnea (adult) (pediatric): Secondary | ICD-10-CM | POA: Diagnosis present

## 2024-01-13 DIAGNOSIS — E876 Hypokalemia: Secondary | ICD-10-CM | POA: Diagnosis present

## 2024-01-13 DIAGNOSIS — K3184 Gastroparesis: Secondary | ICD-10-CM | POA: Diagnosis present

## 2024-01-13 DIAGNOSIS — I130 Hypertensive heart and chronic kidney disease with heart failure and stage 1 through stage 4 chronic kidney disease, or unspecified chronic kidney disease: Secondary | ICD-10-CM | POA: Diagnosis present

## 2024-01-13 DIAGNOSIS — Z9102 Food additives allergy status: Secondary | ICD-10-CM | POA: Diagnosis not present

## 2024-01-13 DIAGNOSIS — Z7985 Long-term (current) use of injectable non-insulin antidiabetic drugs: Secondary | ICD-10-CM | POA: Diagnosis not present

## 2024-01-13 DIAGNOSIS — R1010 Upper abdominal pain, unspecified: Secondary | ICD-10-CM

## 2024-01-13 DIAGNOSIS — K56600 Partial intestinal obstruction, unspecified as to cause: Secondary | ICD-10-CM | POA: Diagnosis present

## 2024-01-13 DIAGNOSIS — Z970 Presence of artificial eye: Secondary | ICD-10-CM | POA: Diagnosis not present

## 2024-01-13 DIAGNOSIS — K5651 Intestinal adhesions [bands], with partial obstruction: Principal | ICD-10-CM | POA: Diagnosis present

## 2024-01-13 DIAGNOSIS — K219 Gastro-esophageal reflux disease without esophagitis: Secondary | ICD-10-CM | POA: Diagnosis present

## 2024-01-13 DIAGNOSIS — E1122 Type 2 diabetes mellitus with diabetic chronic kidney disease: Secondary | ICD-10-CM | POA: Diagnosis present

## 2024-01-13 DIAGNOSIS — N1831 Chronic kidney disease, stage 3a: Secondary | ICD-10-CM | POA: Diagnosis present

## 2024-01-13 DIAGNOSIS — K56609 Unspecified intestinal obstruction, unspecified as to partial versus complete obstruction: Principal | ICD-10-CM | POA: Diagnosis present

## 2024-01-13 DIAGNOSIS — Z91018 Allergy to other foods: Secondary | ICD-10-CM | POA: Diagnosis not present

## 2024-01-13 DIAGNOSIS — Z79899 Other long term (current) drug therapy: Secondary | ICD-10-CM

## 2024-01-13 DIAGNOSIS — Z8249 Family history of ischemic heart disease and other diseases of the circulatory system: Secondary | ICD-10-CM

## 2024-01-13 DIAGNOSIS — Z888 Allergy status to other drugs, medicaments and biological substances status: Secondary | ICD-10-CM

## 2024-01-13 DIAGNOSIS — Z833 Family history of diabetes mellitus: Secondary | ICD-10-CM

## 2024-01-13 DIAGNOSIS — Z7982 Long term (current) use of aspirin: Secondary | ICD-10-CM | POA: Diagnosis not present

## 2024-01-13 DIAGNOSIS — F17210 Nicotine dependence, cigarettes, uncomplicated: Secondary | ICD-10-CM | POA: Diagnosis present

## 2024-01-13 DIAGNOSIS — J449 Chronic obstructive pulmonary disease, unspecified: Secondary | ICD-10-CM | POA: Diagnosis present

## 2024-01-13 DIAGNOSIS — E1143 Type 2 diabetes mellitus with diabetic autonomic (poly)neuropathy: Secondary | ICD-10-CM | POA: Diagnosis present

## 2024-01-13 DIAGNOSIS — K589 Irritable bowel syndrome without diarrhea: Secondary | ICD-10-CM | POA: Diagnosis present

## 2024-01-13 DIAGNOSIS — I5032 Chronic diastolic (congestive) heart failure: Secondary | ICD-10-CM | POA: Diagnosis present

## 2024-01-13 DIAGNOSIS — Z7984 Long term (current) use of oral hypoglycemic drugs: Secondary | ICD-10-CM | POA: Diagnosis not present

## 2024-01-13 DIAGNOSIS — R140 Abdominal distension (gaseous): Secondary | ICD-10-CM

## 2024-01-13 LAB — CBC
HCT: 49.5 % — ABNORMAL HIGH (ref 36.0–46.0)
Hemoglobin: 16.6 g/dL — ABNORMAL HIGH (ref 12.0–15.0)
MCH: 29.9 pg (ref 26.0–34.0)
MCHC: 33.5 g/dL (ref 30.0–36.0)
MCV: 89 fL (ref 80.0–100.0)
Platelets: 281 10*3/uL (ref 150–400)
RBC: 5.56 MIL/uL — ABNORMAL HIGH (ref 3.87–5.11)
RDW: 12.9 % (ref 11.5–15.5)
WBC: 10.6 10*3/uL — ABNORMAL HIGH (ref 4.0–10.5)
nRBC: 0 % (ref 0.0–0.2)

## 2024-01-13 LAB — COMPREHENSIVE METABOLIC PANEL
ALT: 11 U/L (ref 0–44)
AST: 10 U/L — ABNORMAL LOW (ref 15–41)
Albumin: 4.1 g/dL (ref 3.5–5.0)
Alkaline Phosphatase: 90 U/L (ref 38–126)
Anion gap: 9 (ref 5–15)
BUN: 19 mg/dL (ref 6–20)
CO2: 28 mmol/L (ref 22–32)
Calcium: 11.3 mg/dL — ABNORMAL HIGH (ref 8.9–10.3)
Chloride: 101 mmol/L (ref 98–111)
Creatinine, Ser: 1.18 mg/dL — ABNORMAL HIGH (ref 0.44–1.00)
GFR, Estimated: 53 mL/min — ABNORMAL LOW (ref >60)
Glucose, Bld: 146 mg/dL — ABNORMAL HIGH (ref 70–99)
Potassium: 3.6 mmol/L (ref 3.5–5.1)
Sodium: 138 mmol/L (ref 135–145)
Total Bilirubin: 0.7 mg/dL (ref 0.0–1.2)
Total Protein: 8 g/dL (ref 6.5–8.1)

## 2024-01-13 LAB — GLUCOSE, CAPILLARY
Glucose-Capillary: 140 mg/dL — ABNORMAL HIGH (ref 70–99)
Glucose-Capillary: 162 mg/dL — ABNORMAL HIGH (ref 70–99)
Glucose-Capillary: 169 mg/dL — ABNORMAL HIGH (ref 70–99)

## 2024-01-13 LAB — URINALYSIS, ROUTINE W REFLEX MICROSCOPIC
Bilirubin Urine: NEGATIVE
Glucose, UA: NEGATIVE mg/dL
Ketones, ur: NEGATIVE mg/dL
Leukocytes,Ua: NEGATIVE
Nitrite: NEGATIVE
Protein, ur: 30 mg/dL — AB
Specific Gravity, Urine: >1.046 — ABNORMAL HIGH (ref 1.005–1.030)
pH: 5.5 (ref 5.0–8.0)

## 2024-01-13 LAB — LIPASE, BLOOD: Lipase: 19 U/L (ref 11–51)

## 2024-01-13 LAB — LACTIC ACID, PLASMA: Lactic Acid, Venous: 0.9 mmol/L (ref 0.5–1.9)

## 2024-01-13 MED ORDER — LIDOCAINE VISCOUS HCL 2 % MT SOLN
15.0000 mL | Freq: Once | OROMUCOSAL | Status: AC
  Administered 2024-01-13: 15 mL via OROMUCOSAL
  Filled 2024-01-13: qty 15

## 2024-01-13 MED ORDER — ALBUTEROL SULFATE (2.5 MG/3ML) 0.083% IN NEBU: 2.5000 mg | INHALATION_SOLUTION | Freq: Four times a day (QID) | RESPIRATORY_TRACT | Status: DC | PRN

## 2024-01-13 MED ORDER — MORPHINE SULFATE (PF) 2 MG/ML IV SOLN
2.0000 mg | Freq: Once | INTRAVENOUS | Status: AC
  Administered 2024-01-13: 2 mg via INTRAVENOUS
  Filled 2024-01-13: qty 1

## 2024-01-13 MED ORDER — HYDROMORPHONE HCL 1 MG/ML IJ SOLN
0.5000 mg | INTRAMUSCULAR | Status: DC | PRN
  Administered 2024-01-13: 1 mg via INTRAVENOUS
  Filled 2024-01-13: qty 1

## 2024-01-13 MED ORDER — LABETALOL HCL 5 MG/ML IV SOLN: 10.0000 mg | Freq: Four times a day (QID) | INTRAVENOUS | Status: DC | PRN

## 2024-01-13 MED ORDER — INSULIN ASPART 100 UNIT/ML IJ SOLN
0.0000 [IU] | INTRAMUSCULAR | Status: DC
  Administered 2024-01-13 – 2024-01-15 (×3): 1 [IU] via SUBCUTANEOUS

## 2024-01-13 MED ORDER — IOHEXOL 350 MG/ML SOLN
100.0000 mL | Freq: Once | INTRAVENOUS | Status: AC | PRN
  Administered 2024-01-13: 100 mL via INTRAVENOUS

## 2024-01-13 MED ORDER — DIATRIZOATE MEGLUMINE & SODIUM 66-10 % PO SOLN
90.0000 mL | Freq: Once | ORAL | Status: AC
  Administered 2024-01-14: 90 mL via NASOGASTRIC
  Filled 2024-01-13 (×2): qty 90

## 2024-01-13 MED ORDER — LACTATED RINGERS IV BOLUS
1000.0000 mL | Freq: Once | INTRAVENOUS | Status: AC
  Administered 2024-01-13: 1000 mL via INTRAVENOUS

## 2024-01-13 MED ORDER — ONDANSETRON HCL 4 MG/2ML IJ SOLN: 4.0000 mg | Freq: Four times a day (QID) | INTRAMUSCULAR | Status: DC | PRN

## 2024-01-13 MED ORDER — PHENOL 1.4 % MT LIQD
1.0000 | OROMUCOSAL | Status: DC | PRN
  Filled 2024-01-13: qty 177

## 2024-01-13 MED ORDER — ONDANSETRON HCL 4 MG PO TABS: 4.0000 mg | ORAL_TABLET | Freq: Four times a day (QID) | ORAL | Status: DC | PRN

## 2024-01-13 MED ORDER — POTASSIUM CHLORIDE 10 MEQ/100ML IV SOLN
10.0000 meq | Freq: Once | INTRAVENOUS | Status: AC
  Administered 2024-01-13: 10 meq via INTRAVENOUS
  Filled 2024-01-13: qty 100

## 2024-01-13 MED ORDER — SODIUM CHLORIDE 0.9 % IV SOLN: INTRAVENOUS | Status: AC

## 2024-01-13 NOTE — ED Provider Notes (Signed)
[place] EMERGENCY DEPARTMENT AT [facility] Provider Note   CSN: [id number] Arrival date & time: 01/13/24  1021     History  Chief Complaint  Patient presents with   Abdominal Pain    Lisa [name] is a 63 y.o. female.  This is a 53-year-old female present emergency department for abdominal pain.  Reports symptoms started last night.  Been constant since that time.  Crampy.  Nauseated, no vomiting.  Last bowel movement yesterday.  Reports prior hiatal hernia.   Abdominal Pain      Home Medications Prior to Admission medications   Medication Sig Start Date End Date Taking? Authorizing Provider  allopurinol (ZYLOPRIM) 100 MG tablet TAKE 1 TABLET BY MOUTH EVERY DAY 08/18/23  Yes Rodolph Bong, MD  ASPIRIN 81 PO Take 81 mg by mouth daily.   Yes [provider]  buPROPion (WELLBUTRIN XL) 150 MG 24 hr tablet TAKE 1 TABLET BY MOUTH EVERY DAY 08/18/23  Yes Shelva Majestic, MD  carvedilol (COREG) 25 MG tablet TAKE 1 TABLET BY MOUTH TWICE A DAY 01/06/23  Yes Nahser, Deloris Ping, MD  dicyclomine (BENTYL) 10 MG capsule TAKE 1 CAPSULE (10 MG TOTAL) BY MOUTH 3 (THREE) TIMES DAILY BEFORE MEALS. AS NEEDED 12/14/22  Yes Shelva Majestic, MD  Evolocumab (REPATHA SURECLICK) 140 MG/ML SOAJ INJECT 140 MG INTO THE SKIN EVERY 14 (FOURTEEN) DAYS. 11/29/23  Yes Nahser, Deloris Ping, MD  glimepiride (AMARYL) 2 MG tablet Take 1 tablet (2 mg total) by mouth daily before breakfast for 10 days. 12/29/23 01/13/24 Yes Jeani Sow, MD  pantoprazole (PROTONIX) 40 MG tablet TAKE 1 TABLET BY MOUTH 30 MINUTES BEFORE BREAKFAST DAILY 10/04/23  Yes Meryl Dare, MD  telmisartan (MICARDIS) 40 MG tablet TAKE 1 TABLET BY MOUTH EVERY DAY 01/12/24  Yes Shelva Majestic, MD  tirzepatide [facility]) 2.5 MG/0.5ML Pen Inject 2.5 mg into the skin once a week. 12/23/23  Yes Shelva Majestic, MD  albuterol (VENTOLIN HFA) 108 (90 Base) MCG/ACT inhaler Inhale 2 puffs into the lungs every 6 (six) hours as needed  for wheezing or shortness of breath (or cough.). Patient not taking: Reported on 01/13/2024 12/08/23   Dulce Sellar, NP  lidocaine (XYLOCAINE) 2 % solution Use as directed 15 mLs in the mouth or throat every 3 (three) hours as needed for mouth pain (gargle; may spit or swallow). Patient not taking: Reported on 01/13/2024 12/08/23   Dulce Sellar, NP  ondansetron (ZOFRAN-ODT) 4 MG disintegrating tablet Take 1 tablet (4 mg total) by mouth every 8 (eight) hours as needed for nausea or vomiting. Patient not taking: Reported on 12/14/2023 09/21/23   Zenia Resides, MD  [facility] LANCETS MISC Use to check blood sugars twice a day 12/20/17   Illa Level M, PA-C      Allergies    Almond oil, Apple juice, Ceftin [cefuroxime], Cherry extract, Other, Guaifenesin & derivatives, Hctz [hydrochlorothiazide], Naproxen, Walnut, and Lisinopril    Review of Systems   Review of Systems  Gastrointestinal:  Positive for abdominal pain.    Physical Exam Updated Vital Signs BP (!) 156/79   Pulse 71   Temp 98.1 F (36.7 C) (Oral)   Resp 18   SpO2 96%  Physical Exam Vitals and nursing note reviewed.  Cardiovascular:     Rate and Rhythm: Normal rate and regular rhythm.  Pulmonary:     Effort: Pulmonary effort is normal.     Breath sounds: Normal breath sounds.  Abdominal:  General: There is distension.     Palpations: Abdomen is soft.     Tenderness: There is generalized abdominal tenderness. There is no guarding or rebound.  Skin:    General: Skin is warm and dry.     Capillary Refill: Capillary refill takes less than 2 seconds.  Neurological:     General: No focal deficit present.     Mental Status: She is alert.  Psychiatric:        Mood and Affect: Mood normal.        Behavior: Behavior normal.     ED Results / Procedures / Treatments   Labs (all labs ordered are listed, but only abnormal results are displayed) Labs Reviewed  COMPREHENSIVE METABOLIC PANEL - Abnormal; Notable  for the following components:      Result Value   Glucose, Bld 146 (*)    Creatinine, Ser 1.18 (*)    Calcium 11.3 (*)    AST 10 (*)    GFR, Estimated 53 (*)    All other components within normal limits  CBC - Abnormal; Notable for the following components:   WBC 10.6 (*)    RBC 5.56 (*)    Hemoglobin 16.6 (*)    HCT 49.5 (*)    All other components within normal limits  URINALYSIS, ROUTINE W REFLEX MICROSCOPIC - Abnormal; Notable for the following components:   Specific Gravity, Urine >1.046 (*)    Hgb urine dipstick SMALL (*)    Protein, ur 30 (*)    Bacteria, UA RARE (*)    All other components within normal limits  LIPASE, BLOOD  LACTIC ACID, PLASMA  LACTIC ACID, PLASMA    EKG EKG Interpretation Date/Time:  Thursday January 13 2024 10:32:52 EST Ventricular Rate:  70 PR Interval:  182 QRS Duration:  100 QT Interval:  392 QTC Calculation: 423 R Axis:   -4  Text Interpretation: Normal sinus rhythm Septal infarct , age undetermined Abnormal ECG No significant change since last tracing Confirmed by Estanislado Pandy [phone]) on 01/13/2024 11:20:18 AM  Radiology No results found.  Procedures Procedures    Medications Ordered in ED Medications  diatrizoate meglumine-sodium (GASTROGRAFIN) 66-10 % solution 90 mL (has no administration in time range)  morphine (PF) 2 MG/ML injection 2 mg (2 mg Intravenous Given 01/13/24 1152)  iohexol (OMNIPAQUE) 350 MG/ML injection 100 mL (100 mLs Intravenous Contrast Given 01/13/24 1214)  morphine (PF) 2 MG/ML injection 2 mg (2 mg Intravenous Given 01/13/24 1416)  lactated ringers bolus 1,000 mL (1,000 mLs Intravenous New Bag/Given 01/13/24 1425)  lidocaine (XYLOCAINE) 2 % viscous mouth solution 15 mL (15 mLs Mouth/Throat Given 01/13/24 1510)    ED Course/ Medical Decision Making/ A&P Clinical Course as of 01/13/24 1532  Thu Jan 13, 2024  1157 Comprehensive metabolic panel(!) No significant metabolic derangements.  Normal kidney function.   No transaminitis is just hepatobiliary disease. [TY]  1158 Lipase: 19 Not consistent with pancreatitis [TY]  1158 No leukocytosis that would suggest systemic infection. [TY]  1445 CT ABDOMEN PELVIS W CONTRAST IMPRESSION: 1. There are multiple dilated mid/distal ileal loops with diameter measuring up to 3.5 cm. However, no definite transition point noted. Stomach, duodenum and jejunal bowel loops as well as several distal ileal loops and colon are nondilated. Findings favor partial small bowel obstruction likely secondary to underlying adhesions. No walled-off abscess or loculated collection. There is no pneumatosis, pneumoperitoneum or portal venous gas. 2. Multiple other nonacute observations, as described above   [TY]  Clinical Course User Index [TY] Coral Spikes, DO                                 Medical Decision Making This is a 63 year old female morbid obesity, IBS, GERD diverticulosis, diabetes, COPD, diastolic CHF.  Per chart review has had prior umbilical hernia repair and C-section she presents emergency department for abdominal pain.  She is afebrile nontachycardic, hypertensive.  Has distended abdomen is diffusely tender, but not peritonitic.  Received IV morphine for pain control.  As noted in ED with mild leukocytosis.  CT abdomen on my independent interpretation appears to have dilated loops of bowel concerning for possible SBO.  Awaiting radiology final report.  Will get lactic and start IV fluids. Will likely need admission.  Amount and/or Complexity of Data Reviewed Independent Historian:     Details: Spouse notes history of IBS External Data Reviewed:     Details: See above Labs: ordered. Decision-making details documented in ED Course.    Details: See ED course Radiology: ordered and independent interpretation performed. Decision-making details documented in ED Course.    Details: See above Discussion of management or test interpretation with external  provider(s): Hospitalist and surgery;   Risk Prescription drug management. Decision regarding hospitalization.         Final Clinical Impression(s) / ED Diagnoses Final diagnoses:  SBO (small bowel obstruction) (HCC)  Small bowel obstruction [facility])    Rx / DC Orders ED Discharge Orders     None         Coral Spikes, DO 01/13/24 1532

## 2024-01-13 NOTE — ED Notes (Signed)
Patient is being discharged from the Urgent Care and sent to the Emergency Department via POV . Per Jerrilyn Cairo, NP, patient is in need of higher level of care due to abdominal pain. Patient is aware and verbalizes understanding of plan of care.  Vitals:   01/13/24 0926 01/13/24 0937  BP: 127/85   Pulse: 80 75  Resp: 20   Temp: 98.5 F (36.9 C)   SpO2: 94% 99%

## 2024-01-13 NOTE — ED Triage Notes (Addendum)
Pt c/o severe mid abd pain with nausea today. Referred by UC. Endorses bentyl at 0200 today. Last BM yesterday afternoon

## 2024-01-13 NOTE — Consult Note (Incomplete)
Consult Note  CLYDELL ALBERTS 06/22/1961  [id number].    Requesting MD: Dr. Maple Hudson Chief Complaint/Reason for Consult: abdominal pain, possible   HPI:  63 y.o. female with medical history significant for gastroparesis, diverticulitis, COPD, CHF, HTN, IBS, OSA, T2DM, arthritis who presented to [facility] ED with abdominal pain. She started having abdominal pain last night and it has been constant since that time. She has had nausea without vomiting. She went to urgent care this am and was recommended to report to ED given severity of her pain and her history of diverticulitis. Her last BM was the day prior to ED presentation and she last ate prior to pain onset.  Work up in ED significant for CT scan showing multiple dilated mid/distal ileal loops with diameter measuring up to 3.5 cm. However, no definite transition point noted. Stomach, duodenum and jejunal bowel loops as well as several distal ileal loops and colon are nondilated. Findings favor partial small bowel obstruction likely secondary to underlying adhesions. WBC elevated to 10.6 with hemoglobin of 16.6 and Cr 1.18. Favor hemoconcentration for WBC elevation.   Currently, nausea is*** improved. No emesis. Abdominal pain is*** improved.    Substance use: every day cigarette smoker Blood thinners: none*** Past Surgeries: cesarean section, cholecystectomy, umbilical hernia repair, ventral hernia repair, ***tubal ligation   ROS: Reviewed and as above  Family History  Problem Relation Age of Onset   Hypertension Mother    Lung cancer Mother        6   Heart disease Father        pacemaker or defibrillator   Breast cancer Maternal Aunt 78   Heart attack Paternal Uncle    Diabetes Maternal Grandmother    Colon cancer Neg Hx    Esophageal cancer Neg Hx    Pancreatic cancer Neg Hx     Past Medical History:  Diagnosis Date   Allergic rhinitis    Arthritis    Asthma    Breast nodule    right breast, to see dr Vincente Poli  06-20-2012 for    Chest pain, non-cardiac    Chronic headaches    Cigarette smoker    Complication of anesthesia 05-17-2012   trouble breathing after colonscopy, needed nebulizer   COPD (chronic obstructive pulmonary disease) (HCC)    Diastolic congestive heart failure (HCC) 04/17/2019   Diverticulosis    GERD (gastroesophageal reflux disease)    Hepatic hemangioma    Hypertension    IBS (irritable bowel syndrome)    Nuclear stress test    Myoview 05/2019: EF 63, no ischemia. Low Risk   Obesity    Prosthetic eye globe    Pyloric erosion    Sleep apnea    CPAP setting varies from 4-10   Type 2 diabetes mellitus (HCC) 03/11/2017    Past Surgical History:  Procedure Laterality Date   ABDOMINAL AORTOGRAM W/LOWER EXTREMITY N/A 04/30/2017   Procedure: Abdominal Aortogram w/Lower Extremity;  Surgeon: Sherren Kerns, MD;  Location: [facility] INVASIVE CV LAB;  Service: Cardiovascular;  Laterality: N/A;   CARDIAC CATHETERIZATION  12/28/2007   EF 75%. IT REVEALS NORMAL/SUPRANORMAL LEFT VENTRICULAR SYSTOLIC FUNCTION   CARDIOVASCULAR STRESS TEST  03/25/2007   EF 78%   CESAREAN SECTION  2003, 2009   x 2   CHOLECYSTECTOMY  1993   COLONOSCOPY  2014   ENDOMETRIAL ABLATION  09/2010   and D&C   HERNIA REPAIR  2004   umbilical   LAPAROSCOPY  PERIPHERAL VASCULAR BALLOON ANGIOPLASTY Left 04/30/2017   Procedure: Peripheral Vascular Balloon Angioplasty;  Surgeon: Sherren Kerns, MD;  Location: [facility] INVASIVE CV LAB;  Service: Cardiovascular;  Laterality: Left;  SFA   RECONSTRUCTION OF EYELID Bilateral    TUBAL LIGATION     UMBILICAL HERNIA REPAIR  2004   US ECHOCARDIOGRAPHY  05/08/2008   EF 55-60%   VENTRAL HERNIA REPAIR  06/15/2012   Procedure: LAPAROSCOPIC VENTRAL HERNIA;  Surgeon: Mariella Saa, MD;  Location: [facility] ORS;  Service: General;  Laterality: N/A;  LAPAROSCOPIC REPAIR VENTRAL HERNIA   VIDEO BRONCHOSCOPY Bilateral 11/15/2017   Procedure: VIDEO BRONCHOSCOPY WITH FLUORO;  Surgeon: Leslye Peer, MD;  Location: [facility] ENDOSCOPY;  Service: Cardiopulmonary;  Laterality: Bilateral;    Social History:  reports that she has been smoking cigarettes. She has a 57 pack-year smoking history. She has never used smokeless tobacco. She reports that she does not drink alcohol and does not use drugs.  Allergies:  Allergies  Allergen Reactions   Almond Oil Anaphylaxis and Nausea And Vomiting    Almond-Nuts Only   Apple Juice Anaphylaxis   Ceftin [Cefuroxime] Other (See Comments)    Severe stomach pain - Diverticulitis flares up   Cherry Extract Anaphylaxis    Cherry fruit only    Other Anaphylaxis and Itching    Most fruits- Are NOT tolerated (PLEASE ASK PATIENT BEFORE GIVING, AS CERTAIN FRUITS ARE TOLERATED IN LIMITED QUANTITIES!!)   Guaifenesin & Derivatives Other (See Comments)    Restlessness, jittery   Hctz [Hydrochlorothiazide] Other (See Comments)    Causes a headache   Naproxen Other (See Comments)    Insomnia   Walnut Itching    Throat itching   Lisinopril Cough         (Not in a hospital admission)   Blood pressure (!) 156/79, pulse 71, temperature 98.1 F (36.7 C), temperature source Oral, resp. rate 18, SpO2 96%. Physical Exam:*** General: pleasant, WD, female who is laying in bed in NAD*** HEENT: head is normocephalic, atraumatic.  Sclera are noninjected.  Pupils equal and round. EOMs intact.  Ears and nose without any masses or lesions.  Mouth is pink and moist Heart: regular, rate, and rhythm.  Normal s1,s2. No obvious murmurs, gallops, or rubs noted.  Palpable radial and pedal pulses bilaterally Lungs: CTAB, no wheezes, rhonchi, or rales noted.  Respiratory effort nonlabored Abd: ***soft, NT, ND, +BS, no masses, hernias, or organomegaly MSK: all 4 extremities are symmetrical with no cyanosis, clubbing, or edema. Skin: warm and dry with no masses, lesions, or rashes Neuro: Cranial nerves 2-12 grossly intact, sensation is normal throughout Psych: A&Ox3 with  an appropriate affect.    Results for orders placed or performed during the hospital encounter of 01/13/24 (from the past 48 hours)  Lipase, blood     Status: None   Collection Time: 01/13/24 11:03 AM  Result Value Ref Range   Lipase 19 11 - 51 U/L    Comment: Performed at [facility], [address] [place], [place] [address]  Comprehensive metabolic panel     Status: Abnormal   Collection Time: 01/13/24 11:03 AM  Result Value Ref Range   Sodium 138 135 - 145 mmol/L   Potassium 3.6 3.5 - 5.1 mmol/L   Chloride 101 98 - 111 mmol/L   CO2 28 22 - 32 mmol/L   Glucose, Bld 146 (H) 70 - 99 mg/dL    Comment: Glucose reference range applies only to samples taken after  fasting for at least 8 hours.   BUN 19 6 - 20 mg/dL   Creatinine, Ser 1.61 (H) 0.44 - 1.00 mg/dL   Calcium 09.6 (H) 8.9 - 10.3 mg/dL   Total Protein 8.0 6.5 - 8.1 g/dL   Albumin 4.1 3.5 - 5.0 g/dL   AST 10 (L) 15 - 41 U/L   ALT 11 0 - 44 U/L   Alkaline Phosphatase 90 38 - 126 U/L   Total Bilirubin 0.7 0.0 - 1.2 mg/dL   GFR, Estimated 53 (L) >60 mL/min    Comment: (NOTE) Calculated using the CKD-EPI Creatinine Equation (2021)    Anion gap 9 5 - 15    Comment: Performed at [facility], [address] [place], [place] [address]  CBC     Status: Abnormal   Collection Time: 01/13/24 11:03 AM  Result Value Ref Range   WBC 10.6 (H) 4.0 - 10.5 K/uL   RBC 5.56 (H) 3.87 - 5.11 MIL/uL   Hemoglobin 16.6 (H) 12.0 - 15.0 g/dL   HCT 98.1 (H) 19.1 - 47.8 %   MCV 89.0 80.0 - 100.0 fL   MCH 29.9 26.0 - 34.0 pg   MCHC 33.5 30.0 - 36.0 g/dL   RDW 29.5 62.1 - 30.8 %   Platelets 281 150 - 400 K/uL   nRBC 0.0 0.0 - 0.2 %    Comment: Performed at [facility], [address] [place], [place] [address]  Urinalysis, Routine w reflex microscopic -Urine, Clean Catch     Status: Abnormal   Collection Time: 01/13/24 11:03 AM  Result Value Ref Range   Color, Urine YELLOW YELLOW    APPearance CLEAR CLEAR   Specific Gravity, Urine >1.046 (H) 1.005 - 1.030   pH 5.5 5.0 - 8.0   Glucose, UA NEGATIVE NEGATIVE mg/dL   Hgb urine dipstick SMALL (A) NEGATIVE   Bilirubin Urine NEGATIVE NEGATIVE   Ketones, ur NEGATIVE NEGATIVE mg/dL   Protein, ur 30 (A) NEGATIVE mg/dL   Nitrite NEGATIVE NEGATIVE   Leukocytes,Ua NEGATIVE NEGATIVE   RBC / HPF 0-5 0 - 5 RBC/hpf   WBC, UA 0-5 0 - 5 WBC/hpf   Bacteria, UA RARE (A) NONE SEEN   Squamous Epithelial / HPF 0-5 0 - 5 /HPF   Mucus PRESENT     Comment: Performed at [facility], [address] [place], [place] [address]  Lactic acid, plasma     Status: None   Collection Time: 01/13/24  2:24 PM  Result Value Ref Range   Lactic Acid, Venous 0.9 0.5 - 1.9 mmol/L    Comment: Performed at [facility], [address], [place], [place] [address]   *Note: Due to a large number of results and/or encounters for the requested time period, some results have not been displayed. A complete set of results can be found in Results Review.   No results found.    Assessment/Plan pSBO vs enteritis  Patient seen and examined and relevant labs and imaging personally reviewed. CT concerning for partial small bowel obstruction - no transition point is noted. WBC is slightly elevated likely in the setting of hemoconcentration. She is afebrile with lactic acid normal. She is not*** peritonitic on exam. No current indication for emergency surgery. NGT has been placed. Continue LIWS. Start SBO protocol . Keep K > 4 and Mg > 2 for bowel function. Mobilize for bowel function Hopefully patient will improve with conservative management. If patient fails to improve with conservative management,  he may require exploratory surgery during admission Agree with medical admission. We will follow with you.   FEN: NPO/NGT LIWS, IVF per primary ID: none indicated VTE: okay for chemical prophylaxis from surgical  standpoint  Per primary: H/o Gastroparesis H/o diverticulitis COPD CHF HTN IBS OSA T2DM arthritis  I reviewed {Reviewed data:26882::"last 24 h vitals and pain scores","last 48 h intake and output","last 24 h labs and trends","last 24 h imaging results"}.   ***, PA-C [facility] 01/13/2024, 3:35 PM Please see Amion for pager number during day hours 7:00am-4:30pm

## 2024-01-13 NOTE — ED Triage Notes (Signed)
Pt reports nausea and severe abdominal pain since 1100. "Pain coming in waves". Points to upper abdomen for pain location. Took bentyl early this morning around 0200. Pt appears uncomfortable

## 2024-01-13 NOTE — ED Provider Notes (Signed)
[name]    CSN: [id number] Arrival date & time: 01/13/24  1191      History   Chief Complaint Chief Complaint  Patient presents with   Abdominal Pain    HPI Lisa [name] is a 63 y.o. female.   Patient with a history of asthma, COPD, congestive heart failure, diabetes, recurrent diverticulitis presents today with severe bilateral upper abdominal pain and severe abdominal loading with an onset around midnight last night.  Abdominal pain has become more persistent and she continues to have episodes where the pain is sharp and severe and gradually eases off but does not subside.  Patient endorses that she felt nauseous but has not vomited.  Her last meal was around 6 PM yesterday afternoon.  Patient is followed by [facility] gastroenterology and has had prior bouts of diverticulitis.  Has bowel movement was yesterday and was normal for her.  Past Medical History:  Diagnosis Date   Allergic rhinitis    Arthritis    Asthma    Breast nodule    right breast, to see dr Vincente Poli 06-20-2012 for    Chest pain, non-cardiac    Chronic headaches    Cigarette smoker    Complication of anesthesia 05-17-2012   trouble breathing after colonscopy, needed nebulizer   COPD (chronic obstructive pulmonary disease) (HCC)    Diastolic congestive heart failure (HCC) 04/17/2019   Diverticulosis    GERD (gastroesophageal reflux disease)    Hepatic hemangioma    Hypertension    IBS (irritable bowel syndrome)    Nuclear stress test    Myoview 05/2019: EF 63, no ischemia. 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   CKD (chronic kidney disease), stage III (HCC) 09/18/2020   Fatty liver 11/06/2019   Chronic rhinitis 06/26/2019   Trigger finger, right middle finger 05/04/2019   Coronary artery calcification 04/20/2019   Bilateral leg pain 04/14/2018   Hyperlipidemia 04/12/2018   S/P bronchoscopy with biopsy    Hyperlipidemia associated with type 2 diabetes mellitus (HCC) 09/06/2017   Breast mass 08/03/2017   Chronic hip pain 07/29/2017   PAD (peripheral artery disease) (HCC) 01/19/2017   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   Left knee pain 05/15/2016   Flank pain 04/21/2016   Abdominal pain 02/28/2016   GERD (gastroesophageal reflux disease) 02/14/2016   Morbid obesity (HCC) 02/14/2016   Tobacco use disorder 08/12/2015   Pulmonary nodule, right 04/11/2015   COPD (chronic obstructive pulmonary disease) (HCC) 01/18/2015   Mediastinal lymphadenopathy 01/18/2015   DOE (dyspnea on exertion) 03/08/2014   Migraine without aura 07/26/2013   Sleep apnea 02/11/2012   Chest discomfort 10/12/2011   Essential hypertension 10/12/2011    Past Surgical History:  Procedure Laterality Date   ABDOMINAL AORTOGRAM W/LOWER EXTREMITY N/A 04/30/2017   Procedure: Abdominal Aortogram w/Lower Extremity;  Surgeon: Sherren Kerns, MD;  Location: [facility] INVASIVE CV LAB;  Service: Cardiovascular;  Laterality: N/A;   CARDIAC CATHETERIZATION  12/28/2007   EF 75%. IT REVEALS NORMAL/SUPRANORMAL LEFT VENTRICULAR SYSTOLIC FUNCTION   CARDIOVASCULAR STRESS TEST  03/25/2007   EF 78%   CESAREAN SECTION  2003, 2009   x 2   CHOLECYSTECTOMY  1993   COLONOSCOPY  2014   ENDOMETRIAL ABLATION  09/2010   and D&C   HERNIA REPAIR  2004   umbilical   LAPAROSCOPY     PERIPHERAL VASCULAR BALLOON ANGIOPLASTY Left 04/30/2017   Procedure:  Peripheral Vascular Balloon Angioplasty;  Surgeon: Sherren Kerns, MD;  Location[facility] INVASIVE CV LAB;  Service: Cardiovascular;  Laterality: Left;  SFA   RECONSTRUCTION OF EYELID Bilateral    TUBAL LIGATION     UMBILICAL HERNIA REPAIR  2004   US ECHOCARDIOGRAPHY  05/08/2008   EF 55-60%   VENTRAL HERNIA REPAIR  06/15/2012   Procedure: LAPAROSCOPIC VENTRAL HERNIA;  Surgeon: Mariella Saa, MD;  Location: [facility] ORS;  Service: General;  Laterality: N/A;  LAPAROSCOPIC REPAIR VENTRAL HERNIA   VIDEO BRONCHOSCOPY Bilateral 11/15/2017   Procedure: VIDEO BRONCHOSCOPY WITH FLUORO;  Surgeon: Leslye Peer, MD;  Location: [facility] ENDOSCOPY;  Service: Cardiopulmonary;  Laterality: Bilateral;    OB History     Gravida  4   Para  2   Term  2   Preterm      AB  2   Living  2      SAB      IAB      Ectopic  1   Multiple      Live Births               Home Medications    Prior to Admission medications   Medication Sig Start Date End Date Taking? Authorizing Provider  allopurinol (ZYLOPRIM) 100 MG tablet TAKE 1 TABLET BY MOUTH EVERY DAY 08/18/23  Yes Rodolph Bong, MD  ASPIRIN 81 PO Take 81 mg by mouth daily.   Yes [provider]  buPROPion (WELLBUTRIN XL) 150 MG 24 hr tablet TAKE 1 TABLET BY MOUTH EVERY DAY 08/18/23  Yes Shelva Majestic, MD  carvedilol (COREG) 25 MG tablet TAKE 1 TABLET BY MOUTH TWICE A DAY 01/06/23  Yes Nahser, Deloris Ping, MD  dicyclomine (BENTYL) 10 MG capsule TAKE 1 CAPSULE (10 MG TOTAL) BY MOUTH 3 (THREE) TIMES DAILY BEFORE MEALS. AS NEEDED 12/14/22  Yes Shelva Majestic, MD  Evolocumab (REPATHA SURECLICK) 140 MG/ML SOAJ INJECT 140 MG INTO THE SKIN EVERY 14 (FOURTEEN) DAYS. 11/29/23  Yes Nahser, Deloris Ping, MD  glimepiride (AMARYL) 2 MG tablet Take 1 tablet (2 mg total) by mouth daily before breakfast for 10 days. 12/29/23 01/13/24 Yes Jeani Sow, MD  pantoprazole (PROTONIX) 40 MG tablet TAKE 1 TABLET BY MOUTH 30 MINUTES BEFORE BREAKFAST DAILY 10/04/23   Yes Meryl Dare, MD  telmisartan (MICARDIS) 40 MG tablet TAKE 1 TABLET BY MOUTH EVERY DAY 01/12/24  Yes Shelva Majestic, MD  tirzepatide [facility]) 2.5 MG/0.5ML Pen Inject 2.5 mg into the skin once a week. 12/23/23  Yes Shelva Majestic, MD  albuterol (VENTOLIN HFA) 108 (90 Base) MCG/ACT inhaler Inhale 2 puffs into the lungs every 6 (six) hours as needed for wheezing or shortness of breath (or cough.). Patient not taking: Reported on 01/13/2024 12/08/23   Dulce Sellar, NP  lidocaine (XYLOCAINE) 2 % solution Use as directed 15 mLs in the mouth or throat every 3 (three) hours as needed for mouth  pain (gargle; may spit or swallow). Patient not taking: Reported on 01/13/2024 12/08/23   Dulce Sellar, NP  ondansetron (ZOFRAN-ODT) 4 MG disintegrating tablet Take 1 tablet (4 mg total) by mouth every 8 (eight) hours as needed for nausea or vomiting. Patient not taking: Reported on 12/23/2023 09/21/23   Zenia Resides, MD  ONE [facility] LANCETS MISC Use to check blood sugars twice a day 12/20/17   Demetrio Lapping, PA-C    Family History Family History  Problem Relation Age of Onset   Hypertension Mother    Lung cancer Mother        58   Heart disease Father        pacemaker or defibrillator   Breast cancer Maternal Aunt 78   Heart attack Paternal Uncle    Diabetes Maternal Grandmother    Colon cancer Neg Hx    Esophageal cancer Neg Hx    Pancreatic cancer Neg Hx     Social History Social History   Tobacco Use   Smoking status: Every Day    Current packs/day: 1.50    Average packs/day: 1.5 packs/day for 38.0 years (57.0 ttl pk-yrs)    Types: Cigarettes   Smokeless tobacco: Never   Tobacco comments:    10 cigarettes daily ARJ 09/10/2020  Vaping Use   Vaping status: Never Used  Substance Use Topics   Alcohol use: No    Alcohol/week: 0.0 standard drinks of alcohol   Drug use: No     Allergies   Almond oil, Apple juice, Ceftin [cefuroxime], Cherry extract, Other,  Guaifenesin & derivatives, Hctz [hydrochlorothiazide], Naproxen, Walnut, and Lisinopril   Review of Systems Review of Systems  Gastrointestinal:  Positive for abdominal pain.     Physical Exam Triage Vital Signs ED Triage Vitals  Encounter Vitals Group     BP 01/13/24 0926 127/85     Systolic BP Percentile --      Diastolic BP Percentile --      Pulse Rate 01/13/24 0926 80     Resp 01/13/24 0926 20     Temp 01/13/24 0926 98.5 F (36.9 C)     Temp Source 01/13/24 0926 Oral     SpO2 01/13/24 0926 94 %     Weight --      Height --      Head Circumference --      Peak Flow --      Pain Score 01/13/24 0921 10     Pain Loc --      Pain Education --      Exclude from Growth Chart --    No data found.  Updated Vital Signs BP 127/85 (BP Location: Left Arm)   Pulse 75   Temp 98.5 F (36.9 C) (Oral)   Resp 20   SpO2 99%   Visual Acuity Right Eye Distance:   Left Eye Distance:   Bilateral Distance:    Right Eye Near:   Left Eye Near:    Bilateral Near:     Physical Exam Constitutional:      Appearance: She is obese.  Eyes:     Extraocular Movements: Extraocular movements intact.     Pupils: Pupils are equal, round, and reactive to light.  Abdominal:     Tenderness: There is abdominal tenderness in the right upper quadrant, epigastric area, periumbilical area and left upper quadrant. There is rebound.  Skin:    General: Skin is warm.  Neurological:     General: No focal deficit  present.     Mental Status: She is alert.      UC Treatments / Results  Labs (all labs ordered are listed, but only abnormal results are displayed) Labs Reviewed - No data to display  EKG   Radiology No results found.  Procedures Procedures (including critical care time)  Medications Ordered in UC Medications - No data to display  Initial Impression / Assessment and Plan / UC Course  I have reviewed the triage vital signs and the nursing notes.  Pertinent labs & imaging  results that were available during my care of the patient were reviewed by me and considered in my medical decision making (see chart for details).    Severity of abdominal pain and patient's history of gastroparesis and diverticulitis patient is being referred over to the emergency department for further workup and evaluation of symptoms.  Patient is here with her husband who will transport her directly to [facility].  Patient is aware that if any surgical specialty interventions are needed she would be transferred directly to one of the patient hospitals.  Patient would still like to go to [facility] initially for a workup.  Advised to go directly there. Spouse verbalized understanding and agreement with plan. Final Clinical Impressions(s) / UC Diagnoses   Final diagnoses:  Pain of upper abdomen  Abdominal distension   Discharge Instructions   None    ED Prescriptions   None    PDMP not reviewed this encounter.   Bing Neighbors, NP 01/13/24 [phone]

## 2024-01-13 NOTE — Telephone Encounter (Signed)
The pt is currently in the ED now for evaluation.

## 2024-01-13 NOTE — Telephone Encounter (Signed)
Inbound call from patient stating she is having severe upper abdominal pain. Requesting a urgent call back to discuss options to help. Please advise, thank you.

## 2024-01-13 NOTE — H&P (Signed)
History and Physical    [name] XBM:[id number] DOB: 10/20/1961 DOA: 01/13/2024  PCP: Shelva Majestic, MD   Patient coming from: Home   Chief Complaint: Abdominal pain, nausea   HPI: Lisa [name] is a 63 y.o. female with medical history significant for hypertension, type 2 diabetes mellitus, CKD 3A, and COPD who presents with severe abdominal pain and nausea.  Patient was in her usual state when she developed pain across the upper abdomen last night.  Pain has become more severe and more constant since then.  She has had nausea associated with this, but no vomiting.  She had a normal bowel movement yesterday afternoon but states that she has not passed flatus since then.  She denies fevers, chills, or chest pain.  Surgical history includes 2 cesarean sections and hernia repairs.  [facility] ED Course: Upon arrival to the ED, patient is found to be afebrile and saturating well on room air with normal heart rate and stable blood pressure.  Labs are most notable for creatinine 1.18, WBC 10,600, hemoglobin 16.6, normal lactic acid, and normal lipase.  CT of the abdomen and pelvis is concerning for possible SBO.  Surgery was consulted by the ED physician, NG tube was placed, patient was given a liter of LR and 2 doses of IV morphine, and she was transferred to [facility] for admission.  Review of Systems:  All other systems reviewed and apart from HPI, are negative.  Past Medical History:  Diagnosis Date   Allergic rhinitis    Arthritis    Asthma    Breast nodule    right breast, to see dr Vincente Poli 06-20-2012 for    Chest pain, non-cardiac    Chronic headaches    Cigarette smoker    Complication of anesthesia 05-17-2012   trouble breathing after colonscopy, needed nebulizer   COPD (chronic obstructive pulmonary disease) (HCC)    Diastolic congestive heart failure (HCC) 04/17/2019   Diverticulosis    GERD (gastroesophageal reflux disease)    Hepatic  hemangioma    Hypertension    IBS (irritable bowel syndrome)    Nuclear stress test    Myoview 05/2019: EF 63, no ischemia. Low Risk   Obesity    Prosthetic eye globe    Pyloric erosion    Sleep apnea    CPAP setting varies from 4-10   Type 2 diabetes mellitus (HCC) 03/11/2017    Past Surgical History:  Procedure Laterality Date   ABDOMINAL AORTOGRAM W/LOWER EXTREMITY N/A 04/30/2017   Procedure: Abdominal Aortogram w/Lower Extremity;  Surgeon: Sherren Kerns, MD;  Location: [facility] INVASIVE CV LAB;  Service: Cardiovascular;  Laterality: N/A;   CARDIAC CATHETERIZATION  12/28/2007   EF 75%. IT REVEALS NORMAL/SUPRANORMAL LEFT VENTRICULAR SYSTOLIC FUNCTION   CARDIOVASCULAR STRESS TEST  03/25/2007   EF 78%   CESAREAN SECTION  2003, 2009   x 2   CHOLECYSTECTOMY  1993   COLONOSCOPY  2014   ENDOMETRIAL ABLATION  09/2010   and D&C   HERNIA REPAIR  2004   umbilical   LAPAROSCOPY     PERIPHERAL VASCULAR BALLOON ANGIOPLASTY Left 04/30/2017   Procedure: Peripheral Vascular Balloon Angioplasty;  Surgeon: Sherren Kerns, MD;  Location: [facility] INVASIVE CV LAB;  Service: Cardiovascular;  Laterality: Left;  SFA   RECONSTRUCTION OF EYELID Bilateral    TUBAL LIGATION     UMBILICAL HERNIA REPAIR  2004   US ECHOCARDIOGRAPHY  05/08/2008   EF 55-60%   VENTRAL HERNIA REPAIR  06/15/2012   Procedure: LAPAROSCOPIC VENTRAL HERNIA;  Surgeon: Mariella Saa, MD;  Location: [facility] ORS;  Service: General;  Laterality: N/A;  LAPAROSCOPIC REPAIR VENTRAL HERNIA   VIDEO BRONCHOSCOPY Bilateral 11/15/2017   Procedure: VIDEO BRONCHOSCOPY WITH FLUORO;  Surgeon: Leslye Peer, MD;  Location: [facility] ENDOSCOPY;  Service: Cardiopulmonary;  Laterality: Bilateral;    Social History:   reports that she has been smoking cigarettes. She has a 57 pack-year smoking history. She has never used smokeless tobacco. She reports that she does not drink alcohol and does not use drugs.  Allergies  Allergen Reactions   Almond Oil Anaphylaxis  and Nausea And Vomiting    Almond-Nuts Only   Apple Juice Anaphylaxis   Ceftin [Cefuroxime] Other (See Comments)    Severe stomach pain - Diverticulitis flares up   Cherry Extract Anaphylaxis    Cherry fruit only    Other Anaphylaxis and Itching    Most fruits- Are NOT tolerated (PLEASE ASK PATIENT BEFORE GIVING, AS CERTAIN FRUITS ARE TOLERATED IN LIMITED QUANTITIES!!)   Guaifenesin & Derivatives Other (See Comments)    Restlessness, jittery   Hctz [Hydrochlorothiazide] Other (See Comments)    Causes a headache   Naproxen Other (See Comments)    Insomnia   [place]nut Itching    Throat itching   Lisinopril Cough         Family History  Problem Relation Age of Onset   Hypertension Mother    Lung cancer Mother        73   Heart disease Father        pacemaker or defibrillator   Breast cancer Maternal Aunt 78   Heart attack Paternal Uncle    Diabetes Maternal Grandmother    Colon cancer Neg Hx    Esophageal cancer Neg Hx    Pancreatic cancer Neg Hx      Prior to Admission medications   Medication Sig Start Date End Date Taking? Authorizing Provider  allopurinol (ZYLOPRIM) 100 MG tablet TAKE 1 TABLET BY MOUTH EVERY DAY 08/18/23  Yes Rodolph Bong, MD  ASPIRIN 81 PO Take 81 mg by mouth daily.   Yes [provider]  buPROPion (WELLBUTRIN XL) 150 MG 24 hr tablet TAKE 1 TABLET BY MOUTH EVERY DAY 08/18/23  Yes Shelva Majestic, MD  carvedilol (COREG) 25 MG tablet TAKE 1 TABLET BY MOUTH TWICE A DAY 01/06/23  Yes Nahser, Deloris Ping, MD  dicyclomine (BENTYL) 10 MG capsule TAKE 1 CAPSULE (10 MG TOTAL) BY MOUTH 3 (THREE) TIMES DAILY BEFORE MEALS. AS NEEDED 12/14/22  Yes Shelva Majestic, MD  Evolocumab (REPATHA SURECLICK) 140 MG/ML SOAJ INJECT 140 MG INTO THE SKIN EVERY 14 (FOURTEEN) DAYS. 11/29/23  Yes Nahser, Deloris Ping, MD  glimepiride (AMARYL) 2 MG tablet Take 1 tablet (2 mg total) by mouth daily before breakfast for 10 days. 12/29/23 01/13/24 Yes Jeani Sow, MD  pantoprazole  (PROTONIX) 40 MG tablet TAKE 1 TABLET BY MOUTH 30 MINUTES BEFORE BREAKFAST DAILY 10/04/23  Yes Meryl Dare, MD  telmisartan (MICARDIS) 40 MG tablet TAKE 1 TABLET BY MOUTH EVERY DAY 01/12/24  Yes Shelva Majestic, MD  tirzepatide [facility]) 2.5 MG/0.5ML Pen Inject 2.5 mg into the skin once a week. 12/23/23  Yes Shelva Majestic, MD  albuterol (VENTOLIN HFA) 108 (90 Base) MCG/ACT inhaler Inhale 2 puffs into the lungs every 6 (six) hours as needed for wheezing or shortness of breath (or cough.). Patient not taking: Reported on 01/13/2024 12/08/23  Dulce Sellar, NP  lidocaine (XYLOCAINE) 2 % solution Use as directed 15 mLs in the mouth or throat every 3 (three) hours as needed for mouth pain (gargle; may spit or swallow). Patient not taking: Reported on 01/13/2024 12/08/23   Dulce Sellar, NP  ondansetron (ZOFRAN-ODT) 4 MG disintegrating tablet Take 1 tablet (4 mg total) by mouth every 8 (eight) hours as needed for nausea or vomiting. Patient not taking: Reported on 12/14/2023 09/21/23   Zenia Resides, MD  ONE [facility] LANCETS MISC Use to check blood sugars twice a day 12/20/17   Demetrio Lapping, [place]    Physical Exam: Vitals:   01/13/24 1438 01/13/24 1515 01/13/24 1600 01/13/24 1723  BP:  (!) 156/79 (!) 157/59 (!) 161/65  Pulse:  71 66 68  Resp:   18 18  Temp: 98.1 F (36.7 C)   98.5 F (36.9 C)  TempSrc: Oral     SpO2:  96% 99% 92%    Constitutional: NAD, calm  Eyes: PERTLA, lids and conjunctivae normal ENMT: Mucous membranes are moist. Posterior pharynx clear of any exudate or lesions.   Neck: supple, no masses  Respiratory: no wheezing, no crackles. No accessory muscle use.  Cardiovascular: S1 & S2 heard, regular rate and rhythm. No JVD. Abdomen: Soft, tender in mid- and upper abdomen. Occasional high-pitched bowel sound.    Musculoskeletal: no clubbing / cyanosis. No joint deformity upper and lower extremities.   Skin: no significant rashes, lesions, ulcers. Warm, dry,  well-perfused. Neurologic: CN 2-12 grossly intact. Moving all extremities. Alert and oriented.  Psychiatric: Pleasant. Cooperative.    Labs and Imaging on Admission: I have personally reviewed following labs and imaging studies  CBC: Recent Labs  Lab 01/13/24 1103  WBC 10.6*  HGB 16.6*  HCT 49.5*  MCV 89.0  PLT 281   Basic Metabolic Panel: Recent Labs  Lab 01/13/24 1103  NA 138  K 3.6  CL 101  CO2 28  GLUCOSE 146*  BUN 19  CREATININE 1.18*  CALCIUM 11.3*   GFR: CrCl cannot be calculated (Unknown ideal weight.). Liver Function Tests: Recent Labs  Lab 01/13/24 1103  AST 10*  ALT 11  ALKPHOS 90  BILITOT 0.7  PROT 8.0  ALBUMIN 4.1   Recent Labs  Lab 01/13/24 1103  LIPASE 19   No results for input(s): "AMMONIA" in the last 168 hours. Coagulation Profile: No results for input(s): "INR", "PROTIME" in the last 168 hours. Cardiac Enzymes: No results for input(s): "CKTOTAL", "CKMB", "CKMBINDEX", "TROPONINI" in the last 168 hours. BNP (last 3 results) No results for input(s): "PROBNP" in the last 8760 hours. HbA1C: No results for input(s): "HGBA1C" in the last 72 hours. CBG: No results for input(s): "GLUCAP" in the last 168 hours. Lipid Profile: No results for input(s): "CHOL", "HDL", "LDLCALC", "TRIG", "CHOLHDL", "LDLDIRECT" in the last 72 hours. Thyroid Function Tests: No results for input(s): "TSH", "T4TOTAL", "FREET4", "T3FREE", "THYROIDAB" in the last 72 hours. Anemia Panel: No results for input(s): "VITAMINB12", "FOLATE", "FERRITIN", "TIBC", "IRON", "RETICCTPCT" in the last 72 hours. Urine analysis:    Component Value Date/Time   COLORURINE YELLOW 01/13/2024 1103   APPEARANCEUR CLEAR 01/13/2024 1103   LABSPEC >1.046 (H) 01/13/2024 1103   PHURINE 5.5 01/13/2024 1103   GLUCOSEU NEGATIVE 01/13/2024 1103   GLUCOSEU NEGATIVE 01/06/2024 0853   HGBUR SMALL (A) 01/13/2024 1103   BILIRUBINUR NEGATIVE 01/13/2024 1103   BILIRUBINUR 0 09/07/2023 1034    KETONESUR NEGATIVE 01/13/2024 1103   PROTEINUR 30 (A) 01/13/2024 1103  UROBILINOGEN 0.2 01/06/2024 0853   NITRITE NEGATIVE 01/13/2024 1103   LEUKOCYTESUR NEGATIVE 01/13/2024 1103   Sepsis Labs: @LABRCNTIP (procalcitonin:4,lacticidven:4) )No results found for this or any previous visit (from the past 240 hours).   Radiological Exams on Admission: DG Abd Portable 1 View Result Date: 01/13/2024 CLINICAL DATA:  NG placement. EXAM: PORTABLE ABDOMEN - 1 VIEW COMPARISON:  CT abdomen pelvis dated 01/12/2014. FINDINGS: Enteric tube with tip in the distal body of the stomach. Bibasilar airspace densities may represent pneumonia. Aspiration is not excluded. Mildly dilated small bowel measures up to 3.3 cm. Right upper quadrant cholecystectomy clips. No acute osseous pathology. IMPRESSION: 1. Enteric tube with tip in the distal body of the stomach. Mildly dilated small bowel. 2. Bibasilar airspace densities. Electronically Signed   By: Elgie Collard M.D.   On: 01/13/2024 16:48   CT ABDOMEN PELVIS W CONTRAST Result Date: 01/13/2024 CLINICAL DATA:  Abdominal pain, acute, nonlocalized.  Nausea. EXAM: CT ABDOMEN AND PELVIS WITH CONTRAST TECHNIQUE: Multidetector CT imaging of the abdomen and pelvis was performed using the standard protocol following bolus administration of intravenous contrast. RADIATION DOSE REDUCTION: This exam was performed according to the departmental dose-optimization program which includes automated exposure control, adjustment of the mA and/or kV according to patient size and/or use of iterative reconstruction technique. CONTRAST:  OMNIPAQUE IOHEXOL 350 MG/ML SOLN COMPARISON:  CT scan abdomen and pelvis from 06/14/2022. FINDINGS: Lower chest: There is mosaic attenuation of visualized lungs, consistent with heterogeneous air trapping related to small airways disease. The lung bases are otherwise clear. No pleural effusion. The heart is normal in size. No pericardial effusion.  Hepatobiliary: The liver is normal in size. Non-cirrhotic configuration. No suspicious mass. Redemonstration of a 2.0 x 2.6 cm hemangioma in the right hepatic dome, segment 7/8, similar to the prior study. No intrahepatic or extrahepatic bile duct dilation. Gallbladder is surgically absent. Pancreas: Unremarkable. No pancreatic ductal dilatation or surrounding inflammatory changes. Spleen: Within normal limits. No focal lesion. Adrenals/Urinary Tract: Adrenal glands are unremarkable. No suspicious renal mass. There is a partially exophytic 1.5 x 1.5 cm simple cyst arising from the left kidney lower pole, anteriorly. There are additional, multiple, sub 5 mm hypoattenuating foci in the right kidney, which are too small to adequately characterize. No nephroureterolithiasis or obstructive uropathy on either side. Unremarkable urinary bladder. Stomach/Bowel: Unremarkable appendix. Stomach, duodenum and jejunal bowel loops are nondilated. However, there are multiple dilated mid/distal ileal loops with diameter measuring up to 3.5 cm. There is no abnormal wall thickening. No significant prominence of vasa recta. Small amount of inter bowel free fluid noted. Short segment of distal ileum and colon are nondilated. Findings favor partial small bowel obstruction, likely secondary to underlying adhesions. No walled-off abscess or loculated collection. No pneumoperitoneum, pneumatosis or portal venous gas Vascular/Lymphatic: No abdominal or pelvic lymphadenopathy, by size criteria. No aneurysmal dilation of the major abdominal arteries. There are mild peripheral atherosclerotic vascular calcifications of the aorta and its major branches. Reproductive: The uterus is unremarkable. No large adnexal mass. Other: Patient is status post periumbilical hernia repair with hernia mesh and metallic anchors. No recurrent hernia noted. The soft tissues and abdominal wall are otherwise unremarkable. Musculoskeletal: No suspicious osseous  lesions. IMPRESSION: 1. There are multiple dilated mid/distal ileal loops with diameter measuring up to 3.5 cm. However, no definite transition point noted. Stomach, duodenum and jejunal bowel loops as well as several distal ileal loops and colon are nondilated. Findings favor partial small bowel obstruction likely secondary to underlying  adhesions. No walled-off abscess or loculated collection. There is no pneumatosis, pneumoperitoneum or portal venous gas. 2. Multiple other nonacute observations, as described above. Aortic Atherosclerosis (ICD10-I70.0). Electronically Signed   By: Jules Schick M.D.   On: 01/13/2024 14:33    Assessment/Plan  1. SBO  - NGT placed in ED - Continue bowel rest, NGT decompression, pain-control, supportive care, and follow-up on surgery recommendations   2. Type II DM  - A1c was 10.5% this month  - Check CBGs and use low-intensity SSI for now    3. CKD 3A  - Appears close to baseline  - Renally-dose medications    4. HTN  - Treat as-needed only for now   5. COPD  - Not in exacerbation   - Albuterol as-needed    DVT prophylaxis: SCDs  Code Status: Full  Level of Care: Level of care: Med-Surg Family Communication: None present   Disposition Plan:  Patient is from: Home  Anticipated d/c is to: Home  Anticipated d/c date is: 01/16/24  Patient currently: Pending return of bowel function  Consults called: Surgery  Admission status: Inpatient     Briscoe Deutscher, MD [facility]  01/13/2024, 5:42 PM

## 2024-01-13 NOTE — ED Notes (Signed)
Called [facility] for transport, pt bed assignment is ready

## 2024-01-13 NOTE — ED Notes (Signed)
[facility] at bedside

## 2024-01-13 NOTE — ED Notes (Signed)
Patient transported to CT

## 2024-01-13 NOTE — Telephone Encounter (Signed)
Left message on machine to call back

## 2024-01-14 ENCOUNTER — Inpatient Hospital Stay (HOSPITAL_COMMUNITY): Payer: Managed Care, Other (non HMO)

## 2024-01-14 DIAGNOSIS — K56609 Unspecified intestinal obstruction, unspecified as to partial versus complete obstruction: Secondary | ICD-10-CM | POA: Diagnosis not present

## 2024-01-14 DIAGNOSIS — E1122 Type 2 diabetes mellitus with diabetic chronic kidney disease: Secondary | ICD-10-CM | POA: Diagnosis not present

## 2024-01-14 DIAGNOSIS — J449 Chronic obstructive pulmonary disease, unspecified: Secondary | ICD-10-CM | POA: Diagnosis not present

## 2024-01-14 DIAGNOSIS — N1831 Chronic kidney disease, stage 3a: Secondary | ICD-10-CM | POA: Diagnosis not present

## 2024-01-14 LAB — CBC
HCT: 48.5 % — ABNORMAL HIGH (ref 36.0–46.0)
Hemoglobin: 15.8 g/dL — ABNORMAL HIGH (ref 12.0–15.0)
MCH: 30 pg (ref 26.0–34.0)
MCHC: 32.6 g/dL (ref 30.0–36.0)
MCV: 92 fL (ref 80.0–100.0)
Platelets: 248 10*3/uL (ref 150–400)
RBC: 5.27 MIL/uL — ABNORMAL HIGH (ref 3.87–5.11)
RDW: 12.9 % (ref 11.5–15.5)
WBC: 8.7 10*3/uL (ref 4.0–10.5)
nRBC: 0 % (ref 0.0–0.2)

## 2024-01-14 LAB — GLUCOSE, CAPILLARY
Glucose-Capillary: 132 mg/dL — ABNORMAL HIGH (ref 70–99)
Glucose-Capillary: 138 mg/dL — ABNORMAL HIGH (ref 70–99)
Glucose-Capillary: 133 mg/dL — ABNORMAL HIGH (ref 70–99)
Glucose-Capillary: 125 mg/dL — ABNORMAL HIGH (ref 70–99)
Glucose-Capillary: 118 mg/dL — ABNORMAL HIGH (ref 70–99)

## 2024-01-14 LAB — BASIC METABOLIC PANEL
Anion gap: 13 (ref 5–15)
BUN: 17 mg/dL (ref 6–20)
CO2: 25 mmol/L (ref 22–32)
Calcium: 10.3 mg/dL (ref 8.9–10.3)
Chloride: 102 mmol/L (ref 98–111)
Creatinine, Ser: 0.99 mg/dL (ref 0.44–1.00)
GFR, Estimated: >60 mL/min (ref >60)
Glucose, Bld: 130 mg/dL — ABNORMAL HIGH (ref 70–99)
Potassium: 3.5 mmol/L (ref 3.5–5.1)
Sodium: 140 mmol/L (ref 135–145)

## 2024-01-14 LAB — MAGNESIUM: Magnesium: 2.1 mg/dL (ref 1.7–2.4)

## 2024-01-14 LAB — HIV ANTIBODY (ROUTINE TESTING W REFLEX): HIV Screen 4th Generation wRfx: NONREACTIVE

## 2024-01-14 MED ORDER — MORPHINE SULFATE (PF) 2 MG/ML IV SOLN
2.0000 mg | INTRAVENOUS | Status: DC | PRN
  Administered 2024-01-14 – 2024-01-15 (×3): 2 mg via INTRAVENOUS
  Filled 2024-01-14 (×3): qty 1

## 2024-01-14 MED ORDER — MORPHINE SULFATE (PF) 2 MG/ML IV SOLN
1.0000 mg | INTRAVENOUS | Status: DC | PRN
  Administered 2024-01-14: 1 mg via INTRAVENOUS
  Filled 2024-01-14: qty 1

## 2024-01-14 NOTE — Plan of Care (Signed)

## 2024-01-14 NOTE — Progress Notes (Deleted)
   01/14/24 1412  TOC Brief Assessment  Insurance and Status Reviewed  Patient has primary care physician Yes  Home environment has been reviewed Single family home  Prior level of function: Independent  Prior/Current Home Services No current home services  Social Drivers of Health Review SDOH reviewed no interventions necessary  Readmission risk has been reviewed Yes  Transition of care needs no transition of care needs at this time

## 2024-01-14 NOTE — Progress Notes (Signed)
[facility]  PROGRESS NOTE  Lisa [name] WJX:[id number] DOB: [birth year] DOA: 01/13/2024 PCP: Shelva Majestic, MD   Brief HPI:   63 y.o. female with medical history significant for hypertension, type 2 diabetes mellitus, CKD 3A, and COPD who presents with severe abdominal pain and nausea.   Patient was in her usual state when she developed pain across the upper abdomen last night.  Pain has become more severe and more constant since then.  She has had nausea associated with this, but no vomiting.  She had a normal bowel movement yesterday afternoon but states that she has not passed flatus since then.    Assessment/Plan:   1. SBO  - NGT placed in ED - Continue bowel rest, NGT decompression, pain-control, supportive care, and follow-up on surgery recommendations  -General Surgery following   2. Type II DM  - A1c was 10.5% this month  - Check CBGs and use low-intensity SSI for now     3. CKD 3A  - Appears close to baseline  - Renally-dose medications     4. HTN  - Treat as-needed only for now    5. COPD  - Not in exacerbation   - Albuterol as-needed         Medications     insulin aspart  0-6 Units Subcutaneous Q4H     Data Reviewed:   CBG:  Recent Labs  Lab 01/13/24 1815 01/13/24 2131 01/13/24 2346 01/14/24 0329 01/14/24 0748  GLUCAP 140* 162* 169* 132* 138*    SpO2: 92 %    Vitals:   01/13/24 1723 01/13/24 2129 01/13/24 2309 01/14/24 0327  BP: (!) 161/65 (!) 168/88 (!) 142/64 (!) 147/72  Pulse: 68 73 64 65  Resp: 18 18 18 18   Temp: 98.5 F (36.9 C) 97.8 F (36.6 C) 97.8 F (36.6 C) 97.8 F (36.6 C)  TempSrc:      SpO2: 92% 94% 90% 92%      Data Reviewed:  Basic Metabolic Panel: Recent Labs  Lab 01/13/24 1103 01/14/24 0549  NA 138 140  K 3.6 3.5  CL 101 102  CO2 28 25  GLUCOSE 146* 130*  BUN 19 17  CREATININE 1.18* 0.99  CALCIUM 11.3* 10.3  MG  --  2.1    CBC: Recent Labs  Lab 01/13/24 1103 01/14/24 0549  WBC  10.6* 8.7  HGB 16.6* 15.8*  HCT 49.5* 48.5*  MCV 89.0 92.0  PLT 281 248    LFT Recent Labs  Lab 01/13/24 1103  AST 10*  ALT 11  ALKPHOS 90  BILITOT 0.7  PROT 8.0  ALBUMIN 4.1     Antibiotics: Anti-infectives (From admission, onward)    None        DVT prophylaxis: SCDs  Code Status: Full code  Family Communication: No family at bedside   CONSULTS General Surgery   Subjective   NG tube in place, not passing gas yet.  Had loose stool x 2 yesterday   Objective    Physical Examination:   General-appears in no acute distress Heart-S1-S2, regular, no murmur auscultated Lungs-clear to auscultation bilaterally, no wheezing or crackles auscultated Abdomen-soft, distended, bowel sounds present in all 4 quadrants Extremities-no edema in the lower extremities Neuro-alert, oriented x3, no focal deficit noted  Status is: Inpatient:  60 min           Mayson Mcneish S Taygan Connell   [facility] If 7PM-7AM, please contact night-coverage at [email or web address], Office  [phone]   01/14/2024, 9:15 AM  LOS: 1 day

## 2024-01-14 NOTE — Progress Notes (Signed)
   01/14/24 2052  BiPAP/CPAP/SIPAP  BiPAP/CPAP/SIPAP Pt Type Adult  Reason BIPAP/CPAP not in use NG tube in place

## 2024-01-14 NOTE — Progress Notes (Signed)
   01/14/24 1412  TOC Brief Assessment  Insurance and Status Reviewed  Patient has primary care physician Yes  Home environment has been reviewed Single family home  Prior level of function: Independent  Prior/Current Home Services No current home services  Social Drivers of Health Review SDOH reviewed no interventions necessary  Readmission risk has been reviewed Yes  Transition of care needs no transition of care needs at this time

## 2024-01-14 NOTE — Progress Notes (Signed)
Patient ambulated to BR and had small liquid BM. Pt states she saw blood, I visualized the toilet and did not see any.

## 2024-01-14 NOTE — Progress Notes (Signed)
This shift at 0300 administration of 90 ml gastrografin per NG TUBE.  Moderate toleration by pt. NG tube clamped 3am-4am. Plan of care ongoing

## 2024-01-14 NOTE — Progress Notes (Signed)
   01/14/24 0342  BiPAP/CPAP/SIPAP  BiPAP/CPAP/SIPAP Pt Type Adult  Reason BIPAP/CPAP not in use NG tube in place

## 2024-01-15 DIAGNOSIS — J449 Chronic obstructive pulmonary disease, unspecified: Secondary | ICD-10-CM | POA: Diagnosis not present

## 2024-01-15 DIAGNOSIS — N1831 Chronic kidney disease, stage 3a: Secondary | ICD-10-CM | POA: Diagnosis not present

## 2024-01-15 DIAGNOSIS — K56609 Unspecified intestinal obstruction, unspecified as to partial versus complete obstruction: Secondary | ICD-10-CM | POA: Diagnosis not present

## 2024-01-15 DIAGNOSIS — E1122 Type 2 diabetes mellitus with diabetic chronic kidney disease: Secondary | ICD-10-CM | POA: Diagnosis not present

## 2024-01-15 LAB — GLUCOSE, CAPILLARY
Glucose-Capillary: 118 mg/dL — ABNORMAL HIGH (ref 70–99)
Glucose-Capillary: 129 mg/dL — ABNORMAL HIGH (ref 70–99)
Glucose-Capillary: 141 mg/dL — ABNORMAL HIGH (ref 70–99)
Glucose-Capillary: 152 mg/dL — ABNORMAL HIGH (ref 70–99)
Glucose-Capillary: 166 mg/dL — ABNORMAL HIGH (ref 70–99)
Glucose-Capillary: 187 mg/dL — ABNORMAL HIGH (ref 70–99)

## 2024-01-15 LAB — BASIC METABOLIC PANEL
Anion gap: 9 (ref 5–15)
BUN: 14 mg/dL (ref 6–20)
CO2: 29 mmol/L (ref 22–32)
Calcium: 9.3 mg/dL (ref 8.9–10.3)
Chloride: 102 mmol/L (ref 98–111)
Creatinine, Ser: 1.03 mg/dL — ABNORMAL HIGH (ref 0.44–1.00)
GFR, Estimated: >60 mL/min (ref >60)
Glucose, Bld: 136 mg/dL — ABNORMAL HIGH (ref 70–99)
Potassium: 3.7 mmol/L (ref 3.5–5.1)
Sodium: 140 mmol/L (ref 135–145)

## 2024-01-15 LAB — CBC
HCT: 46.1 % — ABNORMAL HIGH (ref 36.0–46.0)
Hemoglobin: 14.9 g/dL (ref 12.0–15.0)
MCH: 29.9 pg (ref 26.0–34.0)
MCHC: 32.3 g/dL (ref 30.0–36.0)
MCV: 92.4 fL (ref 80.0–100.0)
Platelets: 221 10*3/uL (ref 150–400)
RBC: 4.99 MIL/uL (ref 3.87–5.11)
RDW: 12.8 % (ref 11.5–15.5)
WBC: 7.8 10*3/uL (ref 4.0–10.5)
nRBC: 0 % (ref 0.0–0.2)

## 2024-01-15 NOTE — Plan of Care (Signed)

## 2024-01-15 NOTE — Progress Notes (Signed)
Patient ID: Lisa [name], female   DOB: 1961/10/23, 63 y.o.   MRN: [id number]   Acute Care Surgery Service Progress Note:    Chief Complaint/Subjective: Feels betters. Some gas pain at times; having BMs Min NG output  Objective: Vital signs in last 24 hours: Temp:  [98 F (36.7 C)-98.2 F (36.8 C)] 98.2 F (36.8 C) (02/15 0331) Pulse Rate:  [72-75] 75 (02/15 0331) Resp:  [16-21] 16 (02/15 0331) BP: (155-159)/(61-75) 159/61 (02/15 0331) SpO2:  [96 %-97 %] 96 % (02/15 0331) Last BM Date : 01/14/24  Intake/Output from previous day: 02/14 0701 - 02/15 0700 In: 150 [NG/GT:150] Out: 450 [Emesis/NG output:450] Intake/Output this shift: No intake/output data recorded.  Lungs:  nonlabored  Cardiovascular: reg  Abd: soft, obese, min TTP, no rebound  Extremities: no edema, +SCDs  Neuro: alert, nonfocal  Lab Results: CBC  Recent Labs    01/14/24 0549 01/15/24 0729  WBC 8.7 7.8  HGB 15.8* 14.9  HCT 48.5* 46.1*  PLT 248 221   BMET Recent Labs    01/14/24 0549 01/15/24 0729  NA 140 140  K 3.5 3.7  CL 102 102  CO2 25 29  GLUCOSE 130* 136*  BUN 17 14  CREATININE 0.99 1.03*  CALCIUM 10.3 9.3   LFT    Latest Ref Rng & Units 01/13/2024   11:03 AM 01/06/2024    8:53 AM 09/07/2023   10:36 AM  Hepatic Function  Total Protein 6.5 - 8.1 g/dL 8.0  7.3  7.0   Albumin 3.5 - 5.0 g/dL 4.1  3.9  3.9   AST 15 - 41 U/L 10  11  12    ALT 0 - 44 U/L 11  14  20    Alk Phosphatase 38 - 126 U/L 90  89  99   Total Bilirubin 0.0 - 1.2 mg/dL 0.7  0.5  0.6    PT/INR No results for input(s): "LABPROT", "INR" in the last 72 hours. ABG No results for input(s): "PHART", "HCO3" in the last 72 hours.  Invalid input(s): "PCO2", "PO2"  Studies/Results:  Anti-infectives: Anti-infectives (From admission, onward)    None       Medications: Scheduled Meds:  insulin aspart  0-6 Units Subcutaneous Q4H   Continuous Infusions: PRN Meds:.albuterol, labetalol, morphine injection,  ondansetron **OR** ondansetron (ZOFRAN) IV, phenol  Assessment/Plan: Patient Active Problem List   Diagnosis Date Noted   SBO (small bowel obstruction) (HCC) 01/13/2024   Multinodular goiter 06/10/2022   Type 2 diabetes mellitus with stage 3a chronic kidney disease, without long-term current use of insulin (HCC) 06/10/2022   Anxiety 03/24/2022   Type 2 diabetes mellitus with hyperglycemia, without long-term current use of insulin (HCC) 10/03/2021   Aortic atherosclerosis (HCC) 09/10/2021   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   CKD stage 3a, GFR 45-59 ml/min (HCC) 09/18/2020   Fatty liver 11/06/2019   Chronic rhinitis 06/26/2019   Trigger finger, right middle finger 05/04/2019   Coronary artery calcification 04/20/2019   Bilateral leg pain 04/14/2018   Hyperlipidemia 04/12/2018   S/P bronchoscopy with biopsy    Hyperlipidemia associated with type 2 diabetes mellitus (HCC) 09/06/2017   Breast mass 08/03/2017   Chronic hip pain 07/29/2017   PAD (peripheral artery disease) (HCC) 01/19/2017   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   Left knee pain 05/15/2016   Flank pain 04/21/2016   Abdominal pain 02/28/2016   GERD (gastroesophageal reflux disease) 02/14/2016   Morbid obesity (HCC) 02/14/2016   Tobacco  use disorder 08/12/2015   Pulmonary nodule, right 04/11/2015   COPD (chronic obstructive pulmonary disease) (HCC) 01/18/2015   Mediastinal lymphadenopathy 01/18/2015   DOE (dyspnea on exertion) 03/08/2014   Migraine without aura 07/26/2013   Sleep apnea 02/11/2012   Chest discomfort 10/12/2011   Essential hypertension 10/12/2011   pSBO vs enteritis   Clamp NG tube CLD  If tolerates clamp and CLD this am and lunch, can remove NG later today and adv to FLD   FEN: CLD, clamp NG, IVF per primary ID: none indicated VTE: okay for chemical prophylaxis from surgical standpoint   Per primary: H/o Gastroparesis H/o  diverticulitis COPD CHF HTN IBS OSA T2DM arthritis   I reviewed last 24 h vitals and pain scores, last 48 h intake and output, last 24 h labs and trends, and last 24 h imaging results. Disposition:  LOS: 2 days    Mary Sella. Andrey Campanile, MD, FACS General, Bariatric, & Minimally Invasive Surgery [phone] [facility] Surgery, A [facility]

## 2024-01-15 NOTE — Progress Notes (Incomplete)
Tolerating FLD. Patient has had x2 Bms that were mostly liquid with small flecks of stool. Ambulating full hall several times today.

## 2024-01-15 NOTE — Progress Notes (Signed)
NGT has been clamped approx 3 hrs. On CLD now- tolerating [place] ice asking for water and broth. Has ambulated full length of hall twice.

## 2024-01-15 NOTE — Progress Notes (Signed)
[facility]  PROGRESS NOTE  Lisa [name] RUE:[id number] DOB: 01/09/1961 DOA: 01/13/2024 PCP: Shelva Majestic, MD   Brief HPI:   63 y.o. female with medical history significant for hypertension, type 2 diabetes mellitus, CKD 3A, and COPD who presents with severe abdominal pain and nausea.   Patient was in her usual state when she developed pain across the upper abdomen last night.  Pain has become more severe and more constant since then.  She has had nausea associated with this, but no vomiting.  She had a normal bowel movement yesterday afternoon but states that she has not passed flatus since then.    Assessment/Plan:   1. SBO  - NGT placed in ED -Passing gas and having BMs -NG tube clamped, started on clear liquid diet -General Surgery following, likely NG will be removed this afternoon    2. Type II DM  - A1c was 10.5% this month  - Check CBGs and use low-intensity SSI for now     3. CKD 3A  - Appears close to baseline  - Renally-dose medications     4. HTN  - Treat as-needed only for now  -Continue labetalol as needed   5. COPD  - Not in exacerbation   - Albuterol as-needed         Medications     insulin aspart  0-6 Units Subcutaneous Q4H     Data Reviewed:   CBG:  Recent Labs  Lab 01/14/24 1545 01/14/24 2122 01/15/24 0018 01/15/24 0329 01/15/24 0811  GLUCAP 125* 118* 152* 141* 129*    SpO2: 96 %    Vitals:   01/14/24 0327 01/14/24 1321 01/14/24 1946 01/15/24 0331  BP: (!) 147/72 (!) 155/74 (!) 155/75 (!) 159/61  Pulse: 65 72 72 75  Resp: 18 (!) 21 18 16   Temp: 97.8 F (36.6 C)  98 F (36.7 C) 98.2 F (36.8 C)  TempSrc:   Oral Oral  SpO2: 92% 97% 96% 96%      Data Reviewed:  Basic Metabolic Panel: Recent Labs  Lab 01/13/24 1103 01/14/24 0549 01/15/24 0729  NA 138 140 140  K 3.6 3.5 3.7  CL 101 102 102  CO2 28 25 29   GLUCOSE 146* 130* 136*  BUN 19 17 14   CREATININE 1.18* 0.99 1.03*  CALCIUM 11.3* 10.3 9.3   MG  --  2.1  --     CBC: Recent Labs  Lab 01/13/24 1103 01/14/24 0549 01/15/24 0729  WBC 10.6* 8.7 7.8  HGB 16.6* 15.8* 14.9  HCT 49.5* 48.5* 46.1*  MCV 89.0 92.0 92.4  PLT 281 248 221    LFT Recent Labs  Lab 01/13/24 1103  AST 10*  ALT 11  ALKPHOS 90  BILITOT 0.7  PROT 8.0  ALBUMIN 4.1     Antibiotics: Anti-infectives (From admission, onward)    None        DVT prophylaxis: SCDs  Code Status: Full code  Family Communication: No family at bedside   CONSULTS General Surgery   Subjective    Passing gas this morning, also had BMs.  Objective    Physical Examination:  Appears in no acute distress S1-S2, regular Abdomen is soft, nontender Extremities no edema  Status is: Inpatient:  60 min           Krysta Bloomfield S Alliana Mcauliff   [facility] If 7PM-7AM, please contact night-coverage at [email or web address], Office  [phone]   01/15/2024, 8:33 AM  LOS: 2 days

## 2024-01-15 NOTE — Plan of Care (Signed)
  Problem: Education: Goal: Knowledge of General Education information will improve Description: Including pain rating scale, medication(s)/side effects and non-pharmacologic comfort measures Outcome: Progressing   Problem: Clinical Measurements: Goal: Ability to maintain clinical measurements within normal limits will improve Outcome: Progressing   Problem: Activity: Goal: Risk for activity intolerance will decrease Outcome: Progressing   Problem: Nutrition: Goal: Adequate nutrition will be maintained Outcome: Progressing   Problem: Elimination: Goal: Will not experience complications related to bowel motility Outcome: Progressing Goal: Will not experience complications related to urinary retention Outcome: Progressing   Problem: Pain Managment: Goal: General experience of comfort will improve and/or be controlled Outcome: Progressing   Problem: Safety: Goal: Ability to remain free from injury will improve Outcome: Progressing   Problem: Metabolic: Goal: Ability to maintain appropriate glucose levels will improve Outcome: Progressing

## 2024-01-16 DIAGNOSIS — E1122 Type 2 diabetes mellitus with diabetic chronic kidney disease: Secondary | ICD-10-CM | POA: Diagnosis not present

## 2024-01-16 DIAGNOSIS — J449 Chronic obstructive pulmonary disease, unspecified: Secondary | ICD-10-CM | POA: Diagnosis not present

## 2024-01-16 DIAGNOSIS — K56609 Unspecified intestinal obstruction, unspecified as to partial versus complete obstruction: Secondary | ICD-10-CM | POA: Diagnosis not present

## 2024-01-16 DIAGNOSIS — N1831 Chronic kidney disease, stage 3a: Secondary | ICD-10-CM | POA: Diagnosis not present

## 2024-01-16 LAB — CBC
HCT: 44.3 % (ref 36.0–46.0)
Hemoglobin: 14.3 g/dL (ref 12.0–15.0)
MCH: 30.1 pg (ref 26.0–34.0)
MCHC: 32.3 g/dL (ref 30.0–36.0)
MCV: 93.3 fL (ref 80.0–100.0)
Platelets: 211 10*3/uL (ref 150–400)
RBC: 4.75 MIL/uL (ref 3.87–5.11)
RDW: 12.5 % (ref 11.5–15.5)
WBC: 6.7 10*3/uL (ref 4.0–10.5)
nRBC: 0 % (ref 0.0–0.2)

## 2024-01-16 LAB — BASIC METABOLIC PANEL
Anion gap: 10 (ref 5–15)
BUN: 11 mg/dL (ref 6–20)
CO2: 24 mmol/L (ref 22–32)
Calcium: 9.1 mg/dL (ref 8.9–10.3)
Chloride: 103 mmol/L (ref 98–111)
Creatinine, Ser: 0.98 mg/dL (ref 0.44–1.00)
GFR, Estimated: >60 mL/min (ref >60)
Glucose, Bld: 140 mg/dL — ABNORMAL HIGH (ref 70–99)
Potassium: 3.3 mmol/L — ABNORMAL LOW (ref 3.5–5.1)
Sodium: 137 mmol/L (ref 135–145)

## 2024-01-16 LAB — GLUCOSE, CAPILLARY
Glucose-Capillary: 151 mg/dL — ABNORMAL HIGH (ref 70–99)
Glucose-Capillary: 150 mg/dL — ABNORMAL HIGH (ref 70–99)

## 2024-01-16 MED ORDER — POTASSIUM CHLORIDE 20 MEQ PO PACK
40.0000 meq | PACK | Freq: Once | ORAL | Status: AC
  Administered 2024-01-16: 40 meq via ORAL
  Filled 2024-01-16: qty 2

## 2024-01-16 MED ORDER — INSULIN ASPART 100 UNIT/ML IJ SOLN
0.0000 [IU] | Freq: Three times a day (TID) | INTRAMUSCULAR | Status: DC
  Administered 2024-01-16: 1 [IU] via SUBCUTANEOUS

## 2024-01-16 NOTE — Progress Notes (Signed)
This shift pt ambulated the full length of hallway x2.

## 2024-01-16 NOTE — Plan of Care (Signed)

## 2024-01-16 NOTE — Plan of Care (Signed)
 ?  Problem: Education: ?Goal: Knowledge of General Education information will improve ?Description: Including pain rating scale, medication(s)/side effects and non-pharmacologic comfort measures ?Outcome: Progressing ?  ?Problem: Health Behavior/Discharge Planning: ?Goal: Ability to manage health-related needs will improve ?Outcome: Progressing ?  ?Problem: Clinical Measurements: ?Goal: Ability to maintain clinical measurements within normal limits will improve ?Outcome: Progressing ?Goal: Diagnostic test results will improve ?Outcome: Progressing ?  ?Problem: Activity: ?Goal: Risk for activity intolerance will decrease ?Outcome: Progressing ?  ?Problem: Nutrition: ?Goal: Adequate nutrition will be maintained ?Outcome: Progressing ?  ?

## 2024-01-16 NOTE — Discharge Summary (Addendum)
Physician Discharge Summary   Patient: Lisa [name] MRN: [id number] DOB: January 23, 1961  Admit date:     01/13/2024  Discharge date: 01/16/24  Discharge Physician: Meredeth Ide   PCP: Shelva Majestic, MD   Recommendations at discharge:   Follow-up PCP in 1 week   Discharge Diagnoses: Principal Problem:   SBO (small bowel obstruction) (HCC) Active Problems:   COPD (chronic obstructive pulmonary disease) (HCC)   CKD stage 3a, GFR 45-59 ml/min (HCC)   Type 2 diabetes mellitus with stage 3a chronic kidney disease, without long-term current use of insulin (HCC)  Resolved Problems:   * No resolved hospital problems. *  Hospital Course: 63 y.o. female with medical history significant for hypertension, type 2 diabetes mellitus, CKD 3A, and COPD who presents with severe abdominal pain and nausea.   Patient was in her usual state when she developed pain across the upper abdomen last night.  Pain has become more severe and more constant since then.  She has had nausea associated with this, but no vomiting.  She had a normal bowel movement yesterday afternoon but states that she has not passed flatus since then.    Assessment and Plan:  1.  Small bowel obstruction -Resolved - NGT had to be placed -Patient started on bowel movements and was passing gas -NG tube was removed and patient started on clear liquid diet  -At this time she is tolerating full liquid diet  -Surgery has cleared for discharge   2. Type II DM  - A1c was 10.5% this month  -Continue home regimen   3. CKD 3A  - Appears close to baseline      4. HTN  -Continue home regimen   5. COPD  - Not in exacerbation     6.  Hypokalemia -Potassium is 3.3, will replace potassium before discharge       Consultants:  Procedures performed:  Disposition: Home Diet recommendation:  Discharge Diet Orders (From admission, onward)     Start     Ordered   01/16/24 0000  Diet - low sodium heart healthy         01/16/24 1035           Regular diet DISCHARGE MEDICATION: Allergies as of 01/16/2024       Reactions   Almond Oil Anaphylaxis, Nausea And Vomiting   Almond-Nuts Only   Apple Juice Anaphylaxis   Ceftin [cefuroxime] Other (See Comments)   Severe stomach pain - Diverticulitis flares up   Cherry Extract Anaphylaxis   Cherry fruit only   Other Anaphylaxis, Itching   Most fruits- Are NOT tolerated (PLEASE ASK PATIENT BEFORE GIVING, AS CERTAIN FRUITS ARE TOLERATED IN LIMITED QUANTITIES!!)   Guaifenesin & Derivatives Other (See Comments)   Restlessness, jittery   Hctz [hydrochlorothiazide] Other (See Comments)   Causes a headache   Naproxen Other (See Comments)   Insomnia   Walnut Itching   Throat itching   Lisinopril Cough           Medication List     STOP taking these medications    dicyclomine 10 MG capsule Commonly known as: BENTYL       TAKE these medications    albuterol 108 (90 Base) MCG/ACT inhaler Commonly known as: VENTOLIN HFA Inhale 2 puffs into the lungs every 6 (six) hours as needed for wheezing or shortness of breath (or cough.).   allopurinol 100 MG tablet Commonly known as: ZYLOPRIM TAKE 1 TABLET BY MOUTH EVERY DAY  ASPIRIN 81 PO Take 81 mg by mouth daily.   buPROPion 150 MG 24 hr tablet Commonly known as: WELLBUTRIN XL TAKE 1 TABLET BY MOUTH EVERY DAY   carvedilol 25 MG tablet Commonly known as: COREG TAKE 1 TABLET BY MOUTH TWICE A DAY   glimepiride 2 MG tablet Commonly known as: AMARYL Take 1 tablet (2 mg total) by mouth daily before breakfast for 10 days.   lidocaine 2 % solution Commonly known as: XYLOCAINE Use as directed 15 mLs in the mouth or throat every 3 (three) hours as needed for mouth pain (gargle; may spit or swallow).   ondansetron 4 MG disintegrating tablet Commonly known as: ZOFRAN-ODT Take 1 tablet (4 mg total) by mouth every 8 (eight) hours as needed for nausea or vomiting.   ONE TOUCH LANCETS Misc Use to  check blood sugars twice a day   pantoprazole 40 MG tablet Commonly known as: PROTONIX TAKE 1 TABLET BY MOUTH 30 MINUTES BEFORE BREAKFAST DAILY   Repatha SureClick 140 MG/ML Soaj Generic drug: Evolocumab INJECT 140 MG INTO THE SKIN EVERY 14 (FOURTEEN) DAYS.   telmisartan 40 MG tablet Commonly known as: MICARDIS TAKE 1 TABLET BY MOUTH EVERY DAY   tirzepatide 2.5 MG/0.5ML Pen Commonly known as: MOUNJARO Inject 2.5 mg into the skin once a week.        Follow-up Information     Shelva Majestic, MD Follow up in 1 week(s).   Specialty: Family Medicine Contact information: [address] [place] [address] [phone]                Discharge Exam: There were no vitals filed for this visit. General-appears in no acute distress Heart-S1-S2, regular, no murmur auscultated Lungs-clear to auscultation bilaterally, no wheezing or crackles auscultated Abdomen-soft, nontender, no organomegaly Extremities-no edema in the lower extremities Neuro-alert, oriented x3, no focal deficit noted  Condition at discharge: good  The results of significant diagnostics from this hospitalization (including imaging, microbiology, ancillary and laboratory) are listed below for reference.   Imaging Studies: DG Abd Portable 1V-Small Bowel Obstruction Protocol-initial, 8 hr delay Result Date: 01/14/2024 CLINICAL DATA:  Small bowel obstruction, 8 hour delay. EXAM: PORTABLE ABDOMEN - 1 VIEW COMPARISON:  Radiograph and CT yesterday FINDINGS: Tip and side port of the enteric tube below the diaphragm in the stomach. Enteric contrast is seen within the ascending, descending and rectosigmoid colon. Assisting gaseous small bowel distension centrally, to 3.9 cm. Tacks from prior abdominal wall hernia repair. IMPRESSION: 1. Enteric contrast is seen in the ascending, descending, and rectosigmoid colon. Findings consistent with partial or resolving small bowel obstruction. 2. Persistent gaseous  small bowel distension centrally. Electronically Signed   By: Narda Rutherford M.D.   On: 01/14/2024 15:03   DG Abd Portable 1 View Result Date: 01/13/2024 CLINICAL DATA:  NG placement. EXAM: PORTABLE ABDOMEN - 1 VIEW COMPARISON:  CT abdomen pelvis dated 01/12/2014. FINDINGS: Enteric tube with tip in the distal body of the stomach. Bibasilar airspace densities may represent pneumonia. Aspiration is not excluded. Mildly dilated small bowel measures up to 3.3 cm. Right upper quadrant cholecystectomy clips. No acute osseous pathology. IMPRESSION: 1. Enteric tube with tip in the distal body of the stomach. Mildly dilated small bowel. 2. Bibasilar airspace densities. Electronically Signed   By: Elgie Collard M.D.   On: 01/13/2024 16:48   CT ABDOMEN PELVIS W CONTRAST Result Date: 01/13/2024 CLINICAL DATA:  Abdominal pain, acute, nonlocalized.  Nausea. EXAM: CT ABDOMEN AND PELVIS WITH  CONTRAST TECHNIQUE: Multidetector CT imaging of the abdomen and pelvis was performed using the standard protocol following bolus administration of intravenous contrast. RADIATION DOSE REDUCTION: This exam was performed according to the departmental dose-optimization program which includes automated exposure control, adjustment of the mA and/or kV according to patient size and/or use of iterative reconstruction technique. CONTRAST:  OMNIPAQUE IOHEXOL 350 MG/ML SOLN COMPARISON:  CT scan abdomen and pelvis from 06/14/2022. FINDINGS: Lower chest: There is mosaic attenuation of visualized lungs, consistent with heterogeneous air trapping related to small airways disease. The lung bases are otherwise clear. No pleural effusion. The heart is normal in size. No pericardial effusion. Hepatobiliary: The liver is normal in size. Non-cirrhotic configuration. No suspicious mass. Redemonstration of a 2.0 x 2.6 cm hemangioma in the right hepatic dome, segment 7/8, similar to the prior study. No intrahepatic or extrahepatic bile duct dilation.  Gallbladder is surgically absent. Pancreas: Unremarkable. No pancreatic ductal dilatation or surrounding inflammatory changes. Spleen: Within normal limits. No focal lesion. Adrenals/Urinary Tract: Adrenal glands are unremarkable. No suspicious renal mass. There is a partially exophytic 1.5 x 1.5 cm simple cyst arising from the left kidney lower pole, anteriorly. There are additional, multiple, sub 5 mm hypoattenuating foci in the right kidney, which are too small to adequately characterize. No nephroureterolithiasis or obstructive uropathy on either side. Unremarkable urinary bladder. Stomach/Bowel: Unremarkable appendix. Stomach, duodenum and jejunal bowel loops are nondilated. However, there are multiple dilated mid/distal ileal loops with diameter measuring up to 3.5 cm. There is no abnormal wall thickening. No significant prominence of vasa recta. Small amount of inter bowel free fluid noted. Short segment of distal ileum and colon are nondilated. Findings favor partial small bowel obstruction, likely secondary to underlying adhesions. No walled-off abscess or loculated collection. No pneumoperitoneum, pneumatosis or portal venous gas Vascular/Lymphatic: No abdominal or pelvic lymphadenopathy, by size criteria. No aneurysmal dilation of the major abdominal arteries. There are mild peripheral atherosclerotic vascular calcifications of the aorta and its major branches. Reproductive: The uterus is unremarkable. No large adnexal mass. Other: Patient is status post periumbilical hernia repair with hernia mesh and metallic anchors. No recurrent hernia noted. The soft tissues and abdominal wall are otherwise unremarkable. Musculoskeletal: No suspicious osseous lesions. IMPRESSION: 1. There are multiple dilated mid/distal ileal loops with diameter measuring up to 3.5 cm. However, no definite transition point noted. Stomach, duodenum and jejunal bowel loops as well as several distal ileal loops and colon are nondilated.  Findings favor partial small bowel obstruction likely secondary to underlying adhesions. No walled-off abscess or loculated collection. There is no pneumatosis, pneumoperitoneum or portal venous gas. 2. Multiple other nonacute observations, as described above. Aortic Atherosclerosis (ICD10-I70.0). Electronically Signed   By: Jules Schick M.D.   On: 01/13/2024 14:33    Microbiology: Results for orders placed or performed in visit on 09/07/23  Urine Culture     Status: None   Collection Time: 09/07/23 10:36 AM   Specimen: Blood  Result Value Ref Range Status   MICRO NUMBER: [id number]  Final   SPECIMEN QUALITY: Adequate  Final   Sample Source URINE  Final   STATUS: FINAL  Final   Result:   Final    Less than 10,000 CFU/mL of single Gram negative organism isolated. No further testing will be performed. If clinically indicated, recollection using a method to minimize contamination, with prompt transfer to Urine Culture Transport Tube, is recommended.   *Note: Due to a large number of results and/or encounters for the  requested time period, some results have not been displayed. A complete set of results can be found in Results Review.    Labs: CBC: Recent Labs  Lab 01/13/24 1103 01/14/24 0549 01/15/24 0729 01/16/24 0547  WBC 10.6* 8.7 7.8 6.7  HGB 16.6* 15.8* 14.9 14.3  HCT 49.5* 48.5* 46.1* 44.3  MCV 89.0 92.0 92.4 93.3  PLT 281 248 221 211   Basic Metabolic Panel: Recent Labs  Lab 01/13/24 1103 01/14/24 0549 01/15/24 0729 01/16/24 0547  NA 138 140 140 137  K 3.6 3.5 3.7 3.3*  CL 101 102 102 103  CO2 28 25 29 24   GLUCOSE 146* 130* 136* 140*  BUN 19 17 14 11   CREATININE 1.18* 0.99 1.03* 0.98  CALCIUM 11.3* 10.3 9.3 9.1  MG  --  2.1  --   --    Liver Function Tests: Recent Labs  Lab 01/13/24 1103  AST 10*  ALT 11  ALKPHOS 90  BILITOT 0.7  PROT 8.0  ALBUMIN 4.1   CBG: Recent Labs  Lab 01/15/24 0811 01/15/24 1153 01/15/24 1652 01/15/24 2046 01/16/24 0727   GLUCAP 129* 187* 118* 166* 151*    Discharge time spent: greater than 30 minutes.  Signed: Meredeth Ide, MD [facility] 01/16/2024

## 2024-01-16 NOTE — Progress Notes (Signed)
Patient ID: LYDIANA MILLEY, female   DOB: [birth year], 63 y.o.   MRN: [id number]   Acute Care Surgery Service Progress Note:    Chief Complaint/Subjective: Feels betters.  having BMs No n/v Tolerated CLD and FLD  Objective: Vital signs in last 24 hours: Temp:  [98 F (36.7 C)-98.6 F (37 C)] 98.5 F (36.9 C) (02/16 0414) Pulse Rate:  [69-74] 69 (02/16 0414) Resp:  [14-16] 16 (02/16 0414) BP: (147-163)/(59-74) 147/74 (02/16 0414) SpO2:  [91 %-94 %] 93 % (02/16 0414) Last BM Date : 01/15/24  Intake/Output from previous day: 02/15 0701 - 02/16 0700 In: -  Out: 400 [Emesis/NG output:400] Intake/Output this shift: No intake/output data recorded.  Lungs:  nonlabored  Cardiovascular: reg  Abd: soft, obese, nontender, no rebound  Extremities: no edema, +SCDs  Neuro: alert, nonfocal  Lab Results: CBC  Recent Labs    01/15/24 0729 01/16/24 0547  WBC 7.8 6.7  HGB 14.9 14.3  HCT 46.1* 44.3  PLT 221 211   BMET Recent Labs    01/15/24 0729 01/16/24 0547  NA 140 137  K 3.7 3.3*  CL 102 103  CO2 29 24  GLUCOSE 136* 140*  BUN 14 11  CREATININE 1.03* 0.98  CALCIUM 9.3 9.1   LFT    Latest Ref Rng & Units 01/13/2024   11:03 AM 01/06/2024    8:53 AM 09/07/2023   10:36 AM  Hepatic Function  Total Protein 6.5 - 8.1 g/dL 8.0  7.3  7.0   Albumin 3.5 - 5.0 g/dL 4.1  3.9  3.9   AST 15 - 41 U/L 10  11  12    ALT 0 - 44 U/L 11  14  20    Alk Phosphatase 38 - 126 U/L 90  89  99   Total Bilirubin 0.0 - 1.2 mg/dL 0.7  0.5  0.6    PT/INR No results for input(s): "LABPROT", "INR" in the last 72 hours. ABG No results for input(s): "PHART", "HCO3" in the last 72 hours.  Invalid input(s): "PCO2", "PO2"  Studies/Results:  Anti-infectives: Anti-infectives (From admission, onward)    None       Medications: Scheduled Meds:  insulin aspart  0-6 Units Subcutaneous TID AC & HS   Continuous Infusions: PRN Meds:.albuterol, labetalol, morphine injection, ondansetron  **OR** ondansetron (ZOFRAN) IV, phenol  Assessment/Plan: Patient Active Problem List   Diagnosis Date Noted   SBO (small bowel obstruction) (HCC) 01/13/2024   Multinodular goiter 06/10/2022   Type 2 diabetes mellitus with stage 3a chronic kidney disease, without long-term current use of insulin (HCC) 06/10/2022   Anxiety 03/24/2022   Type 2 diabetes mellitus with hyperglycemia, without long-term current use of insulin (HCC) 10/03/2021   Aortic atherosclerosis (HCC) 09/10/2021   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   CKD stage 3a, GFR 45-59 ml/min (HCC) 09/18/2020   Fatty liver 11/06/2019   Chronic rhinitis 06/26/2019   Trigger finger, right middle finger 05/04/2019   Coronary artery calcification 04/20/2019   Bilateral leg pain 04/14/2018   Hyperlipidemia 04/12/2018   S/P bronchoscopy with biopsy    Hyperlipidemia associated with type 2 diabetes mellitus (HCC) 09/06/2017   Breast mass 08/03/2017   Chronic hip pain 07/29/2017   PAD (peripheral artery disease) (HCC) 01/19/2017   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   Left knee pain 05/15/2016   Flank pain 04/21/2016   Abdominal pain 02/28/2016   GERD (gastroesophageal reflux disease) 02/14/2016   Morbid obesity (HCC) 02/14/2016  Tobacco use disorder 08/12/2015   Pulmonary nodule, right 04/11/2015   COPD (chronic obstructive pulmonary disease) (HCC) 01/18/2015   Mediastinal lymphadenopathy 01/18/2015   DOE (dyspnea on exertion) 03/08/2014   Migraine without aura 07/26/2013   Sleep apnea 02/11/2012   Chest discomfort 10/12/2011   Essential hypertension 10/12/2011   pSBO vs enteritis   Tolerated FLD Adv to soft this am   FEN:adv to soft, IVF per primary; hypokalemia replace potassium per [facility] ID: none indicated VTE: okay for chemical prophylaxis from surgical standpoint   Per primary: H/o Gastroparesis H/o diverticulitis COPD CHF HTN IBS OSA T2DM arthritis   I  reviewed last 24 h vitals and pain scores, last 48 h intake and output, last 24 h labs and trends, and last 24 h imaging results. Disposition: can go home; told pt to alternate b/t FLD and soft diet next few days as intestines continue to recover  LOS: 3 days    Mary Sella. Andrey Campanile, MD, FACS General, Bariatric, & Minimally Invasive Surgery [phone] [facility]

## 2024-01-17 ENCOUNTER — Telehealth: Payer: Self-pay | Admitting: *Deleted

## 2024-01-17 ENCOUNTER — Encounter: Payer: Self-pay | Admitting: Family Medicine

## 2024-01-17 ENCOUNTER — Ambulatory Visit: Payer: Managed Care, Other (non HMO) | Admitting: Internal Medicine

## 2024-01-17 NOTE — Transitions of Care (Post Inpatient/ED Visit) (Signed)
   01/17/2024  Name: Lisa [name] MRN: [id number] DOB: Sep 24, 1961  Today's TOC FU Call Status: Today's TOC FU Call Status:: Successful TOC FU Call Completed TOC FU Call Complete Date: 01/17/24 Patient's Name and Date of Birth confirmed.  Transition Care Management Follow-up Telephone Call Date of Discharge: 01/16/24 Discharge Facility: [facility]) Type of Discharge: Inpatient Admission Primary Inpatient Discharge Diagnosis:: Small Bowel Obstruction How have you been since you were released from the hospital?: Better (Patient has has 1 med size BM today) Any questions or concerns?: Yes Patient Questions/Concerns:: Patient has questions about seeing CHF on paper work Patient Questions/Concerns Addressed: Other: (Patient has put a call into PCP/ noite infor back in 03/2019)  Items Reviewed: Did you receive and understand the discharge instructions provided?: Yes Medications obtained,verified, and reconciled?: Yes (Medications Reviewed) Any new allergies since your discharge?: No Dietary orders reviewed?: No Do you have support at home?: Yes People in Home: spouse Name of Support/Comfort Primary Source: Lisa [name]  Medications Reviewed Today: Medications Reviewed Today   Medications were not reviewed in this encounter     Home Care and Equipment/Supplies: Were Home Health Services Ordered?: NA Any new equipment or medical supplies ordered?: NA  Functional Questionnaire: Do you need assistance with bathing/showering or dressing?: No Do you need assistance with meal preparation?: No Do you need assistance with eating?: No Do you have difficulty maintaining continence: No Do you need assistance with getting out of bed/getting out of a chair/moving?: No Do you have difficulty managing or taking your medications?: No  Follow up appointments reviewed: PCP Follow-up appointment confirmed?: No MD Provider Line Number:[phone] Given: Yes (Patient will call PCP now for Dr  appt) Specialist Hospital Follow-up appointment confirmed?: No Reason Specialist Follow-Up Not Confirmed: Patient has Specialist Provider Number and will Call for Appointment (Paitent will call endocrinologist and reschedule appt) Do you need transportation to your follow-up appointment?: No Do you understand care options if your condition(s) worsen?: Yes-patient verbalized understanding  SDOH Interventions Today    Flowsheet Row Most Recent Value  SDOH Interventions   Food Insecurity Interventions Intervention Not Indicated  Housing Interventions Intervention Not Indicated  Transportation Interventions Intervention Not Indicated, Patient Resources (Friends/Family)  Utilities Interventions Intervention Not Indicated      Interventions Today    Flowsheet Row Most Recent Value  Chronic Disease   Chronic disease during today's visit Other  [small bowel obstruction]  General Interventions   General Interventions Discussed/Reviewed General Interventions Discussed, General Interventions Reviewed, Doctor Visits  Doctor Visits Discussed/Reviewed Doctor Visits Discussed, Doctor Visits Reviewed, PCP, Specialist  Education Interventions   Education Provided Provided Education  Provided Verbal Education On Other, Medication, Nutrition  [RN discussed how uncomtrolled  diabetes can affect body causing symptoms of sbo.]  Nutrition Interventions   Nutrition Discussed/Reviewed Nutrition Discussed, Decreasing sugar intake  Pharmacy Interventions   Pharmacy Dicussed/Reviewed Pharmacy Topics Discussed, Pharmacy Topics Reviewed  [Patient stated she didn't get to take her mounjaro for a couple of months due to issues with prescription and she has just started back on it but didn't get to take it fri and that is wh yher A1C is going up.]       Manufacturing engineer [employer] Population Health Care Management Coordinator [name] Direct Dial: [phone]  Fax:  [phone] Website: [email or web address]

## 2024-01-18 ENCOUNTER — Encounter: Payer: Self-pay | Admitting: Internal Medicine

## 2024-01-18 ENCOUNTER — Ambulatory Visit (INDEPENDENT_AMBULATORY_CARE_PROVIDER_SITE_OTHER): Payer: Managed Care, Other (non HMO) | Admitting: Internal Medicine

## 2024-01-18 VITALS — BP 128/82 | HR 82 | Ht 66.0 in | Wt 250.4 lb

## 2024-01-18 DIAGNOSIS — N1831 Chronic kidney disease, stage 3a: Secondary | ICD-10-CM

## 2024-01-18 DIAGNOSIS — E1122 Type 2 diabetes mellitus with diabetic chronic kidney disease: Secondary | ICD-10-CM | POA: Diagnosis not present

## 2024-01-18 DIAGNOSIS — E1165 Type 2 diabetes mellitus with hyperglycemia: Secondary | ICD-10-CM

## 2024-01-18 DIAGNOSIS — Z7984 Long term (current) use of oral hypoglycemic drugs: Secondary | ICD-10-CM

## 2024-01-18 DIAGNOSIS — E042 Nontoxic multinodular goiter: Secondary | ICD-10-CM

## 2024-01-18 LAB — POCT GLUCOSE (DEVICE FOR HOME USE): Glucose Fasting, POC: 187 mg/dL — AB (ref 70–99)

## 2024-01-18 MED ORDER — TIRZEPATIDE 2.5 MG/0.5ML ~~LOC~~ SOAJ: 2.5000 mg | SUBCUTANEOUS | 3 refills | Status: DC

## 2024-01-18 MED ORDER — GLIPIZIDE 5 MG PO TABS: 5.0000 mg | ORAL_TABLET | Freq: Two times a day (BID) | ORAL | 3 refills | Status: DC

## 2024-01-18 NOTE — Patient Instructions (Addendum)
Take Glipizide 5 mg , 1 tablet before breakfast and 1 tablet before Supper  HOLD Mounjaro for a month, than restart at 2.5 mg weekly    HOW TO TREAT LOW BLOOD SUGARS (Blood sugar LESS THAN 70 MG/DL) Please follow the RULE OF 15 for the treatment of hypoglycemia treatment (when your (blood sugars are less than 70 mg/dL)   STEP 1: Take 15 grams of carbohydrates when your blood sugar is low, which includes:  3-4 GLUCOSE TABS  OR 3-4 OZ OF JUICE OR REGULAR SODA OR ONE TUBE OF GLUCOSE GEL    STEP 2: RECHECK blood sugar in 15 MINUTES STEP 3: If your blood sugar is still low at the 15 minute recheck --> then, go back to STEP 1 and treat AGAIN with another 15 grams of carbohydrates.

## 2024-01-18 NOTE — Progress Notes (Unsigned)
Name: Lisa [name]  Age/ Sex: 63 y.o., female   MRN/ DOB: [id number], [birth year]     PCP: Shelva Majestic, MD   Reason for Endocrinology Evaluation: Type 2 Diabetes Mellitus/ Abnormal FNA  Initial Endocrine Consultative Visit: 05/23/2021    PATIENT IDENTIFIER: Lisa [name] is a 63 y.o. female with a past medical history of HTN, Migraine headaches, T2DM, diastolic dysfunction and PAD. The patient has followed with Endocrinology clinic since 05/23/2021 for consultative assistance with management of her diabetes.      DIABETIC HISTORY:  Ms. Mccrystal was diagnosed with DM 2018. Was on Metformin but was stopped due to low GFR.Ozempic caused GI side effects . Her hemoglobin A1c has ranged from 6.5% in 02/2021, peaking at 9.5% in 08/2021.   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     SUBJECTIVE:    Today (01/18/2024): Ms. Revak is here for a follow up on diabetes management and MNG.  She checks her blood sugars 1 times daily. She did not bring her meter today .The patient has not had hypoglycemic episodes since the last clinic visit.   Patient was hospitalized for small bowel obstruction 2/202 which resolved clinically  She has been without Mounjaro for 2 months, than restarted 2 weeks ago, as her pharmacy has been having difficulty obtaining Mounjaro She took glimepiride for only 10 days  She has no recent  abdominal pain, nausea or vomiting  She did have 3 bowel movement yesterday.  Denies palpitations  Denies local neck swelling  Follows with cardiology   HOME DIABETES REGIMEN:  Mounjaro 2.5 mg Glimepiride 2 mg daily-not taking    Statin: no, she is on Praluent  ACE-I/ARB: yes Prior Diabetic Education: No   METER DOWNLOAD SUMMARY:       DIABETIC COMPLICATIONS:  Macrovascular complications:   Denies: CAD, CVA, PVD   HISTORY:  Past Medical History:  Past Medical History:  Diagnosis Date   Allergic rhinitis    Arthritis    Asthma    Breast nodule    right breast, to see dr Vincente Poli 06-20-2012 for    Chest pain, non-cardiac    Chronic headaches    Cigarette smoker    Complication of anesthesia 05-17-2012   trouble breathing after colonscopy, needed nebulizer   COPD (chronic obstructive pulmonary disease) (HCC)    Diastolic congestive heart failure (HCC) 04/17/2019   Diverticulosis    GERD (gastroesophageal reflux disease)    Hepatic hemangioma    Hypertension    IBS (irritable bowel syndrome)    Nuclear stress test    Myoview 05/2019: EF 63, no ischemia. Low Risk   Obesity    Prosthetic eye globe    Pyloric erosion    Sleep apnea    CPAP setting varies from 4-10   Type 2 diabetes mellitus (HCC) 03/11/2017   Past Surgical History:  Past Surgical History:  Procedure Laterality Date  ABDOMINAL AORTOGRAM W/LOWER EXTREMITY N/A 04/30/2017   Procedure: Abdominal Aortogram w/Lower Extremity;  Surgeon: Sherren Kerns, MD;  Location: [facility] INVASIVE CV LAB;  Service: Cardiovascular;  Laterality: N/A;   CARDIAC CATHETERIZATION  12/28/2007   EF 75%. IT REVEALS NORMAL/SUPRANORMAL LEFT VENTRICULAR SYSTOLIC FUNCTION   CARDIOVASCULAR STRESS TEST  03/25/2007   EF 78%   CESAREAN SECTION  2003, 2009   x 2   CHOLECYSTECTOMY  1993   COLONOSCOPY  2014   ENDOMETRIAL ABLATION  09/2010   and D&C   HERNIA REPAIR  2004   umbilical   LAPAROSCOPY     PERIPHERAL VASCULAR BALLOON  ANGIOPLASTY Left 04/30/2017   Procedure: Peripheral Vascular Balloon Angioplasty;  Surgeon: Sherren Kerns, MD;  Location: [facility] INVASIVE CV LAB;  Service: Cardiovascular;  Laterality: Left;  SFA   RECONSTRUCTION OF EYELID Bilateral    TUBAL LIGATION     UMBILICAL HERNIA REPAIR  2004   US ECHOCARDIOGRAPHY  05/08/2008   EF 55-60%   VENTRAL HERNIA REPAIR  06/15/2012   Procedure: LAPAROSCOPIC VENTRAL HERNIA;  Surgeon: Mariella Saa, MD;  Location: [facility] ORS;  Service: General;  Laterality: N/A;  LAPAROSCOPIC REPAIR VENTRAL HERNIA   VIDEO BRONCHOSCOPY Bilateral 11/15/2017   Procedure: VIDEO BRONCHOSCOPY WITH FLUORO;  Surgeon: Leslye Peer, MD;  Location: [facility] ENDOSCOPY;  Service: Cardiopulmonary;  Laterality: Bilateral;   Social History:  reports that she has been smoking cigarettes. She has a 57 pack-year smoking history. She has never used smokeless tobacco. She reports that she does not drink alcohol and does not use drugs. Family History:  Family History  Problem Relation Age of Onset   Hypertension Mother    Lung cancer Mother        15   Heart disease Father        pacemaker or defibrillator   Breast cancer Maternal Aunt 78   Heart attack Paternal Uncle    Diabetes Maternal Grandmother    Colon cancer Neg Hx    Esophageal cancer Neg Hx    Pancreatic cancer Neg Hx      HOME MEDICATIONS: Allergies as of 01/18/2024       Reactions   Almond Oil Anaphylaxis, Nausea And Vomiting   Almond-Nuts Only   Apple Juice Anaphylaxis   Ceftin [cefuroxime] Other (See Comments)   Severe stomach pain - Diverticulitis flares up   Cherry Extract Anaphylaxis   Cherry fruit only   Other Anaphylaxis, Itching   Most fruits- Are NOT tolerated (PLEASE ASK PATIENT BEFORE GIVING, AS CERTAIN FRUITS ARE TOLERATED IN LIMITED QUANTITIES!!)   Guaifenesin & Derivatives Other (See Comments)   Restlessness, jittery   Hctz [hydrochlorothiazide] Other (See Comments)   Causes a headache   Naproxen Other (See  Comments)   Insomnia   Walnut Itching   Throat itching   Lisinopril Cough           Medication List        Accurate as of January 18, 2024  8:48 AM. If you have any questions, ask your nurse or doctor.          albuterol 108 (90 Base) MCG/ACT inhaler Commonly known as: VENTOLIN HFA Inhale 2 puffs into the lungs every 6 (six) hours as needed for wheezing or shortness of breath (or cough.).   allopurinol 100 MG tablet Commonly known as: ZYLOPRIM TAKE 1 TABLET BY MOUTH EVERY DAY   ASPIRIN 81 PO Take 81 mg by mouth daily.   buPROPion 150 MG 24 hr  tablet Commonly known as: WELLBUTRIN XL TAKE 1 TABLET BY MOUTH EVERY DAY   carvedilol 25 MG tablet Commonly known as: COREG TAKE 1 TABLET BY MOUTH TWICE A DAY   glimepiride 2 MG tablet Commonly known as: AMARYL Take 1 tablet (2 mg total) by mouth daily before breakfast for 10 days.   lidocaine 2 % solution Commonly known as: XYLOCAINE Use as directed 15 mLs in the mouth or throat every 3 (three) hours as needed for mouth pain (gargle; may spit or swallow).   ondansetron 4 MG disintegrating tablet Commonly known as: ZOFRAN-ODT Take 1 tablet (4 mg total) by mouth every 8 (eight) hours as needed for nausea or vomiting.   ONE TOUCH LANCETS Misc Use to check blood sugars twice a day   pantoprazole 40 MG tablet Commonly known as: PROTONIX TAKE 1 TABLET BY MOUTH 30 MINUTES BEFORE BREAKFAST DAILY   Repatha SureClick 140 MG/ML Soaj Generic drug: Evolocumab INJECT 140 MG INTO THE SKIN EVERY 14 (FOURTEEN) DAYS.   telmisartan 40 MG tablet Commonly known as: MICARDIS TAKE 1 TABLET BY MOUTH EVERY DAY   tirzepatide 2.5 MG/0.5ML Pen Commonly known as: MOUNJARO Inject 2.5 mg into the skin once a week.         OBJECTIVE:   Vital Signs: BP 128/82 (BP Location: Left Arm, Patient Position: Sitting, Cuff Size: Normal)   Pulse 82   Ht 5\' 6"  (1.676 m)   Wt 250 lb 6.4 oz (113.6 kg)   SpO2 98%   BMI 40.42 kg/m   Wt  Readings from Last 3 Encounters:  01/18/24 250 lb 6.4 oz (113.6 kg)  12/23/23 259 lb 12.8 oz (117.8 kg)  12/14/23 260 lb 12.8 oz (118.3 kg)     Exam: General: Pt appears well and is in NAD  Neck: General: Supple without adenopathy. Thyroid: Thyroid size normal.  Bilateral thyroid nodules appreciated  Lungs: Clear with good BS bilat with no rales, rhonchi, or wheezes  Heart: RRR   Extremities: No pretibial edema.   Neuro: MS is good with appropriate affect, pt is alert and Ox3      DM Foot Exam 07/16/2023 The skin of the feet is intact without sores or ulcerations. The pedal pulses are 2+ on right and 2+ on left. The sensation is intact to a screening 5.07, 10 gram monofilament bilaterally  DATA REVIEWED:  Lab Results  Component Value Date   HGBA1C 10.5 (H) 01/06/2024   HGBA1C 9.6 (H) 09/07/2023   HGBA1C 7.9 (A) 07/16/2023    Latest Reference Range & Units 01/16/24 05:47  Sodium 135 - 145 mmol/L 137  Potassium 3.5 - 5.1 mmol/L 3.3 (L)  Chloride 98 - 111 mmol/L 103  CO2 22 - 32 mmol/L 24  Glucose 70 - 99 mg/dL 829 (H)  BUN 6 - 20 mg/dL 11  Creatinine 5.62 - 1.30 mg/dL 8.65  Calcium 8.9 - 78.4 mg/dL 9.1  Anion gap 5 - 15  10  GFR, Estimated >60 mL/min >60  (L): Data is abnormally low (H): Data is abnormally high    Thyroid Ultrasound 08/05/2023  Estimated total number of nodules >/= 1 cm: 3   Number of spongiform nodules >/=  2 cm not described below (TR1): 0   Number of mixed cystic and solid nodules >/= 1.5 cm not described below (TR2): 0   _________________________________________________________   Nodule labeled 1 inferior right thyroid measures 1.0 cm. Spongiform characteristics and does not meet criteria for surveillance.   Nodule labeled 2, superior  left thyroid, 1.2 cm. Nodule has TR 4 characteristics and meets criteria for surveillance.   Nodule labeled 3 inferior left thyroid, 4.2 cm. Nodule has been previously biopsied. Assuming benign result no  further specific follow-up would be indicated.   No adenopathy   IMPRESSION: Left superior thyroid nodule (labeled 2, 1.2 cm) meets criteria for surveillance, as designated by the newly established ACR TI-RADS criteria. Surveillance ultrasound study recommended to be performed annually up to 5 years.   FNA 04/22/2021   Clinical History: Left; Inferior 2.6cm; Other 2 dimensions: 2.4 x 2.0cm,  Solid /almost completely solid, Hypoechoic, TI-RADS total points 4.  Specimen Submitted:  A. 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 Goal A1c < 7.0 %.    -She has been on metformin in the past, I suspect this has been discontinued due to low GFR at some point  -She has been on Ozempic but this caused GI issues  -Intolerant to [place] due to severe yeast infection -She has done well with Trulicity, but due to shortage in supply she was switched to [facility] with continued difficulty in obtaining it through the pharmacy -Due to her recent small bowel obstruction, I will recommend holding off on Mounjaro at this time and we will use sulfonylurea -She may restart the [facility] after a month if her GI symptoms have completely resolved by then  MEDICATIONS: Start glipizide 5 mg twice daily  EDUCATION / INSTRUCTIONS: BG monitoring instructions: Patient is instructed to check her blood sugars 1 times a day, fasting. Call [facility] if: BG persistently < 70 I reviewed the Rule of 15 for the treatment of hypoglycemia in detail with the patient. Literature supplied.     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  -Will repeat thyroid ultrasound later  this year - TFT's remain normal     F/U in 6 months   Signed electronically by: Lyndle Herrlich, MD  [facility] Endocrinology  [facility] [address] [place] [place], [place] [address] Phone: [phone] FAX: [phone]   CC: Shelva Majestic, MD [address] [place] [address] Phone: [phone]  Fax: [phone]  Return to Endocrinology clinic as below: Future Appointments  Date Time Provider Department Center  01/18/2024  8:50 AM Wilmore Holsomback, Konrad Dolores, MD [facility] None  01/24/2024  4:00 PM Shelva Majestic, MD [facility]  06/15/2024  2:20 PM Durene Cal Aldine Contes, MD [facility]

## 2024-01-20 ENCOUNTER — Telehealth: Payer: Self-pay

## 2024-01-20 NOTE — Telephone Encounter (Signed)
As long as she is passing gas and is without pain may continue to hydrate well and eat-needs to return to care if develops worsening abdominal pain.  She could try some MiraLAX as well

## 2024-01-20 NOTE — Telephone Encounter (Signed)
Pt made aware of Dr Erasmo Leventhal recommendations and expressed clear understanding. Pt has started taking Miralax 2 hours ago.

## 2024-01-20 NOTE — Telephone Encounter (Signed)
Copied from CRM [id number]. Topic: General - Other >> Jan 20, 2024  8:39 AM Rodman Pickle T wrote: Reason for CRM: patient called in stating that she has not used the bathroom since Monday she stated she does not have any pain are cramping she would like a call back regarding this.   Spoke with pt over the phone, pt states that she was discharged on Sunday from hospital from a bowel obstruction. Sunday and Monday pt was having normal bowel movements. On Tuesday, pt c.o of not able to have any bowel moments. Pt states that she is only having gas, when it feels like she has to go to the restroom, nothing comes out. Pt denies, nausea, vomiting, any dry or hard stools, no abdominal pain or discomfort or cramping. Pt does have a history of constipation and her GI physician is Dr Russella Dar. Pt denies taking any narcotic pain meds and denies blood in stool. Pt has not tired anything over the counter for her sxs. Please advise. Thanks!

## 2024-01-24 ENCOUNTER — Encounter: Payer: Self-pay | Admitting: Family Medicine

## 2024-01-24 ENCOUNTER — Ambulatory Visit: Payer: Managed Care, Other (non HMO) | Admitting: Family Medicine

## 2024-01-24 VITALS — BP 100/70 | HR 81 | Temp 98.1°F | Ht 66.0 in | Wt 246.4 lb

## 2024-01-24 DIAGNOSIS — T466X5A Adverse effect of antihyperlipidemic and antiarteriosclerotic drugs, initial encounter: Secondary | ICD-10-CM

## 2024-01-24 DIAGNOSIS — Z7984 Long term (current) use of oral hypoglycemic drugs: Secondary | ICD-10-CM

## 2024-01-24 DIAGNOSIS — I10 Essential (primary) hypertension: Secondary | ICD-10-CM | POA: Diagnosis not present

## 2024-01-24 DIAGNOSIS — N1831 Chronic kidney disease, stage 3a: Secondary | ICD-10-CM | POA: Diagnosis not present

## 2024-01-24 DIAGNOSIS — E1122 Type 2 diabetes mellitus with diabetic chronic kidney disease: Secondary | ICD-10-CM

## 2024-01-24 DIAGNOSIS — F17200 Nicotine dependence, unspecified, uncomplicated: Secondary | ICD-10-CM

## 2024-01-24 DIAGNOSIS — Z8719 Personal history of other diseases of the digestive system: Secondary | ICD-10-CM | POA: Diagnosis not present

## 2024-01-24 DIAGNOSIS — J449 Chronic obstructive pulmonary disease, unspecified: Secondary | ICD-10-CM

## 2024-01-24 DIAGNOSIS — E1169 Type 2 diabetes mellitus with other specified complication: Secondary | ICD-10-CM | POA: Diagnosis not present

## 2024-01-24 DIAGNOSIS — E785 Hyperlipidemia, unspecified: Secondary | ICD-10-CM

## 2024-01-24 DIAGNOSIS — G720 Drug-induced myopathy: Secondary | ICD-10-CM

## 2024-01-24 LAB — GLUCOSE, POCT (MANUAL RESULT ENTRY): POC Glucose: 154 mg/dL — AB (ref 70–99)

## 2024-01-24 NOTE — Assessment & Plan Note (Signed)
 Most recent GFR was actually just above 60 but intermittently below 60-we will monitor with CMP

## 2024-01-24 NOTE — Assessment & Plan Note (Signed)
 With COPD would strongly advise smoking cessation but she declines at this time

## 2024-01-24 NOTE — Assessment & Plan Note (Signed)
 Poor control with A1c up to 10.5-has been taken off of Mounjaro for now but may be restarted later by endocrine.  She is on glipizide 5 mg twice daily and typically her sugars have actually been better-she is seen as low as 80 and was 154 today so seems to be improving some but that may be from her caution about her diet.  She has been making some interesting choices such as a smoothie, chocolate bar-recommended reducing sugar intake today

## 2024-01-24 NOTE — Progress Notes (Signed)
 Phone [phone]   Subjective:  Lisa [name] is a 63 y.o. year old very pleasant female patient who presents for transitional care management and hospital follow up for small bowel obstruction. Patient was hospitalized from January 13, 2024 to figure 16 2025. A TCM phone call was completed on 01/17/2024. Medical complexity moderate  Patient presented with severe abdominal pain and nausea-she had a normal bowel movement the day prior but had not passed flatus since that time.  Small bowel obstruction was discovered and she was placed on NG tube.  Thankfully patient started to pass gas and eventually had bowel movement.  NG tube was removed but patient remained on a clear liquid diet and eventually advanced to full liquid diet.  She was monitored by surgery team.  She was discharged on a low-sodium heart healthy diet.  She was instructed to stop taking the Bentyl as it can worsen obstructions (had taken a few times in week prior- took it night before symptoms worsened as was having some pain) -Did have mild hypokalemia and this was repleted -We discussed Mounjaro could contribute to bowel obstruction as well- saw endocrinology and this was stopped  - pain 0.5/10. passing gas regularly. Last bowel movement earlier today.   For her chronic conditions type 2 diabetes was noted with very poor control of A1c at 10.5-she follows up with endocrinology and saw them 01/18/24- on glimepiride now  5 mg twice daily- also may restart the Mounjaro at least a month out from any symptoms. Sugars have been as low as 80's but is watching what she's eating.  -she's somewhat scared to eat so has been regularly hungry.  -she's ending up with a lot of high sugar foods- has had chocolate, coffee drinks.   -For her chronic kidney disease stage III-will stable during hospitalization but she is okay coming back to have labs repeated to recheck this -For hypertension she was maintained on home regimen-carvedilol 25 mg twice  daily, telmisartan 40 mg -Hyperlipidemia was maintained on Repatha every 14 days 140 mg  -COPD noted but not in exacerbation thankfully -She was maintained on allopurinol for gout at 100 mg -For depression she was maintained on Wellbutrin 150 mg -For GERD she was maintained on pantoprazole 40 mg   See problem oriented charting as well  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    Priority: High   Morbid obesity (HCC) 02/14/2016    Priority: High   Tobacco use disorder 08/12/2015    Priority: High   COPD (chronic obstructive pulmonary disease) (HCC) 01/18/2015    Priority: High   History of small bowel obstruction 01/24/2024    Priority: Medium    Multinodular goiter 06/10/2022    Priority: Medium    Anxiety 03/24/2022    Priority: Medium    Aortic atherosclerosis (HCC) 09/10/2021    Priority: Medium    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    Priority: Medium    CKD stage 3a, GFR 45-59 ml/min (HCC) 09/18/2020    Priority: Medium    Fatty liver 11/06/2019    Priority: Medium    Hyperlipidemia 04/12/2018    Priority:  Medium    Hyperlipidemia associated with type 2 diabetes mellitus (HCC) 09/06/2017    Priority: Medium    Breast mass 08/03/2017    Priority: Medium    GERD (gastroesophageal reflux disease) 02/14/2016    Priority: Medium    DOE (dyspnea on exertion) 03/08/2014    Priority: Medium    Migraine without aura 07/26/2013    Priority: Medium    Sleep apnea 02/11/2012    Priority: Medium    Chest discomfort 10/12/2011    Priority: Medium    Essential hypertension 10/12/2011    Priority:  Medium    Chronic rhinitis 06/26/2019    Priority: Low   Trigger finger, right middle finger 05/04/2019    Priority: Low   Bilateral leg pain 04/14/2018    Priority: Low   S/P bronchoscopy with biopsy     Priority: Low   Chronic hip pain 07/29/2017    Priority: Low   Left knee pain 05/15/2016    Priority: Low   Flank pain 04/21/2016    Priority: Low   Abdominal pain 02/28/2016    Priority: Low   Pulmonary nodule, right 04/11/2015    Priority: Low   Mediastinal lymphadenopathy 01/18/2015    Priority: Low   Type 2 diabetes mellitus with stage 3a chronic kidney disease, without long-term current use of insulin (HCC) 06/10/2022    Medications- reviewed and updated  A medical reconciliation was performed comparing current medicines to hospital discharge medications. Current Outpatient Medications  Medication Sig Dispense Refill   albuterol (VENTOLIN HFA) 108 (90 Base) MCG/ACT inhaler Inhale 2 puffs into the lungs every 6 (six) hours as needed for wheezing or shortness of breath (or cough.). 8 g 0   allopurinol (ZYLOPRIM) 100 MG tablet TAKE 1 TABLET BY MOUTH EVERY DAY 90 tablet 1   ASPIRIN 81 PO Take 81 mg by mouth daily.     buPROPion (WELLBUTRIN XL) 150 MG 24 hr tablet TAKE 1 TABLET BY MOUTH EVERY DAY 90 tablet 3   carvedilol (COREG) 25 MG tablet TAKE 1 TABLET BY MOUTH TWICE A DAY 180 tablet 3   Evolocumab (REPATHA SURECLICK) 140 MG/ML SOAJ INJECT 140 MG INTO THE SKIN EVERY 14 (FOURTEEN) DAYS. 6 mL 3   glipiZIDE (GLUCOTROL) 5 MG tablet Take 1 tablet (5 mg total) by mouth 2 (two) times daily before a meal. 180 tablet 3   lidocaine (XYLOCAINE) 2 % solution Use as directed 15 mLs in the mouth or throat every 3 (three) hours as needed for mouth pain (gargle; may spit or swallow). 100 mL 0   ONE TOUCH LANCETS MISC Use to check blood sugars twice a day 200 each 0   pantoprazole (PROTONIX) 40 MG tablet TAKE 1 TABLET BY MOUTH 30 MINUTES BEFORE BREAKFAST DAILY 90 tablet 0   telmisartan  (MICARDIS) 40 MG tablet TAKE 1 TABLET BY MOUTH EVERY DAY 90 tablet 3   ondansetron (ZOFRAN-ODT) 4 MG disintegrating tablet Take 1 tablet (4 mg total) by mouth every 8 (eight) hours as needed for nausea or vomiting. (Patient not taking: Reported on 01/24/2024) 10 tablet 0   No current facility-administered medications for this visit.   Objective  Objective:  BP 100/70   Pulse 81   Temp 98.1 F (36.7 C)   Ht 5\' 6"  (1.676 m)   Wt 246 lb 6.4 oz (111.8 kg)   SpO2 95%   BMI 39.77 kg/m  Gen: NAD, resting comfortably CV: RRR no murmurs rubs or gallops Lungs: CTAB no crackles, wheeze,  rhonchi Abdomen: soft/nontender/nondistended/normal bowel sounds. No rebound or guarding.  Ext: trace edema Skin: warm, dry  Results for orders placed or performed in visit on 01/24/24 (from the past 24 hours)  POCT Glucose (CBG)     Status: Abnormal   Collection Time: 01/24/24  4:17 PM  Result Value Ref Range   POC Glucose 154 (A) 70 - 99 mg/dl   *Note: Due to a large number of results and/or encounters for the requested time period, some results have not been displayed. A complete set of results can be found in Results Review.      Assessment and Plan:   # Hospital follow-up/TCM visit Assessment & Plan History of small bowel obstruction Benign abdominal exam today.  Small bowel obstruction has resolved after NG tube and bowel rest.  Passing gas and having regular bowel movements.  She is very concerned about advancing her diet and we discussed diet extensively-can gradually graduate diet and pull back if needed - Also discussed small bowel obstruction less likely now that she is off Mounjaro and Bentyl and even if later restarts Mounjaro that without Bentyl would also be less likely but if have recurrence on Mounjaro alone would need to stop long-term -Did have hypokalemia in the hospital and agrees to come back for recheck on potassium Type 2 diabetes mellitus with stage 3a chronic kidney disease,  without long-term current use of insulin (HCC) Poor control with A1c up to 10.5-has been taken off of Mounjaro for now but may be restarted later by endocrine.  She is on glipizide 5 mg twice daily and typically her sugars have actually been better-she is seen as low as 80 and was 154 today so seems to be improving some but that may be from her caution about her diet.  She has been making some interesting choices such as a smoothie, chocolate bar-recommended reducing sugar intake today Essential hypertension Blood pressure is well-controlled on carvedilol 25 mg twice daily as well as telmisartan 40 mg Hyperlipidemia associated with type 2 diabetes mellitus (HCC) Well-controlled on Repatha-continue current medication with LDL under 70-work on lifestyle to bring down triglycerides Lab Results  Component Value Date   CHOL 108 01/06/2024   HDL 27.70 (L) 01/06/2024   LDLCALC 40 01/06/2024   LDLDIRECT 144.0 03/24/2022   TRIG 204.0 (H) 01/06/2024   CHOLHDL 4 01/06/2024    CKD stage 3a, GFR 45-59 ml/min (HCC) Most recent GFR was actually just above 60 but intermittently below 60-we will monitor with CMP Tobacco use disorder Strongly encourage cessation-patient declines for now Chronic obstructive pulmonary disease, unspecified COPD type (HCC) With COPD would strongly advise smoking cessation but she declines at this time Statin myopathy Statin myopathy noted-remain on Repatha as alternate  # History of CHF?  Appears this was added by healthy weight to wellness back in 2018.  She had at some point used Lasix and struggled with lower extremity edema but I do not see a formal diagnosis by cardiology and I had actually previously remove this from her problem list-I am going removed from her history as well-cardiology has not specifically listed this previously  # Health maintenance-has upcoming OB/GYN visit with Dr. Milton Ferguson in April  Recommended follow up: Return for next already scheduled visit or  sooner if needed. Future Appointments  Date Time Provider Department Center  04/17/2024  2:00 PM Shamleffer, Konrad Dolores, MD [facility] None  06/15/2024  2:20 PM Durene Cal Aldine Contes, MD [facility]    Lab/Order associations:   ICD-10-CM  1. History of small bowel obstruction  Z87.19     2. Type 2 diabetes mellitus with stage 3a chronic kidney disease, without long-term current use of insulin (HCC)  E11.22 POCT Glucose (CBG)   N18.31 CBC with Differential/Platelet    Comprehensive metabolic panel    Microalbumin / creatinine urine ratio    CANCELED: Comprehensive metabolic panel    CANCELED: CBC with Differential/Platelet    3. Essential hypertension  I10     4. Hyperlipidemia associated with type 2 diabetes mellitus (HCC)  E11.69    E78.5     5. CKD stage 3a, GFR 45-59 ml/min (HCC)  N18.31     6. Tobacco use disorder  F17.200     7. Chronic obstructive pulmonary disease, unspecified COPD type (HCC)  J44.9     8. Statin myopathy  G72.0    T46.6X5A       No orders of the defined types were placed in this encounter.   Return precautions advised.  Tana Conch, MD

## 2024-01-24 NOTE — Assessment & Plan Note (Signed)
 Blood pressure is well-controlled on carvedilol 25 mg twice daily as well as telmisartan 40 mg

## 2024-01-24 NOTE — Assessment & Plan Note (Signed)
 Benign abdominal exam today.  Small bowel obstruction has resolved after NG tube and bowel rest.  Passing gas and having regular bowel movements.  She is very concerned about advancing her diet and we discussed diet extensively-can gradually graduate diet and pull back if needed - Also discussed small bowel obstruction less likely now that she is off Mounjaro and Bentyl and even if later restarts Mounjaro that without Bentyl would also be less likely but if have recurrence on Mounjaro alone would need to stop long-term -Did have hypokalemia in the hospital and agrees to come back for recheck on potassium

## 2024-01-24 NOTE — Assessment & Plan Note (Signed)
 Strongly encourage cessation-patient declines for now

## 2024-01-24 NOTE — Patient Instructions (Addendum)
 Please go to [facility] (updated 01/25/2020) - located [address] across [address] from [place] - in the basement - Hours: 7:30-5:30 PM M-F. You do NOT need an appointment.    I am thrilled you are doing so much better- let us know if you stop having bowel movements or gas in particular again.   Recommended follow up: Return for next already scheduled visit or sooner if needed.

## 2024-01-24 NOTE — Assessment & Plan Note (Signed)
 Well-controlled on Repatha-continue current medication with LDL under 70-work on lifestyle to bring down triglycerides Lab Results  Component Value Date   CHOL 108 01/06/2024   HDL 27.70 (L) 01/06/2024   LDLCALC 40 01/06/2024   LDLDIRECT 144.0 03/24/2022   TRIG 204.0 (H) 01/06/2024   CHOLHDL 4 01/06/2024

## 2024-02-04 ENCOUNTER — Other Ambulatory Visit: Payer: Self-pay | Admitting: Family Medicine

## 2024-02-04 DIAGNOSIS — Z1231 Encounter for screening mammogram for malignant neoplasm of breast: Secondary | ICD-10-CM

## 2024-02-19 ENCOUNTER — Other Ambulatory Visit: Payer: Self-pay | Admitting: Cardiovascular Disease

## 2024-02-21 ENCOUNTER — Encounter: Payer: Self-pay | Admitting: Emergency Medicine

## 2024-03-06 ENCOUNTER — Ambulatory Visit (INDEPENDENT_AMBULATORY_CARE_PROVIDER_SITE_OTHER)

## 2024-03-06 ENCOUNTER — Ambulatory Visit (INDEPENDENT_AMBULATORY_CARE_PROVIDER_SITE_OTHER): Admitting: Family Medicine

## 2024-03-06 ENCOUNTER — Encounter: Payer: Self-pay | Admitting: Family Medicine

## 2024-03-06 ENCOUNTER — Other Ambulatory Visit: Payer: Self-pay

## 2024-03-06 VITALS — BP 136/82 | HR 91 | Ht 66.0 in

## 2024-03-06 DIAGNOSIS — M25561 Pain in right knee: Secondary | ICD-10-CM

## 2024-03-06 DIAGNOSIS — G8929 Other chronic pain: Secondary | ICD-10-CM | POA: Diagnosis not present

## 2024-03-06 LAB — URIC ACID: Uric Acid, Serum: 6.9 mg/dL (ref 2.4–7.0)

## 2024-03-06 MED ORDER — HYDROCODONE-ACETAMINOPHEN 5-325 MG PO TABS: 1.0000 | ORAL_TABLET | Freq: Four times a day (QID) | ORAL | 0 refills | Status: DC | PRN

## 2024-03-06 MED ORDER — PREDNISONE 50 MG PO TABS: 50.0000 mg | ORAL_TABLET | Freq: Every day | ORAL | 0 refills | Status: DC

## 2024-03-06 MED ORDER — KETOROLAC TROMETHAMINE 60 MG/2ML IM SOLN
60.0000 mg | Freq: Once | INTRAMUSCULAR | Status: AC
Start: 2024-03-06 — End: 2024-03-06
  Administered 2024-03-06: 60 mg via INTRAMUSCULAR

## 2024-03-06 MED ORDER — METHYLPREDNISOLONE ACETATE 80 MG/ML IJ SUSP
80.0000 mg | Freq: Once | INTRAMUSCULAR | Status: AC
  Administered 2024-03-06: 80 mg via INTRAMUSCULAR

## 2024-03-06 NOTE — Progress Notes (Signed)
 I, Stevenson Clinch, CMA acting as a scribe for Lisa Graham, MD.  Lisa [name] is a 63 y.o. female who presents to [facility] today for R knee pain. Pt was previously seen by Dr. Denyse Amass on 03/04/23 for a gout flare.  Today, pt c/o R knee pain x 1 week. Pt locates pain to joint line, deep to patella. Mild swelling present. Catching with ambulation. Denies radiating pain. Sx worse with WB and ambulation. Has been ambulating with a crutch. No relief with Tylenol or IBU. Short-term improvement with Lidocaine. Denies injury. Sits a lot at work, works at [employer]. Denies night disturbance.  She notes severe anterior knee pain.  Aggravates: WB, ambulation Treatments tried: Tylenol, IBU  Dx testing: 03/04/23 Lab 02/25/21 R knee XR  Pertinent review of systems: No fevers or chills.     Relevant historical information: History of gout treated last year with colchicine and allopurinol.  She continues to take allopurinol but has not had uric acid level checked since about a year ago. History of small bowel obstruction and ileus thought to be due to GLP-1's and Bentyl.   Exam:  BP 136/82   Pulse 91   Ht 5\' 6"  (1.676 m)   SpO2 99%   BMI 39.77 kg/m  General: Well Developed, well nourished, and in no acute distress.   MSK: Right knee swelling and erythema anterior knee.  Tender palpation in this region.  Decreased range of motion.  Significantly painful for resisted knee extension.    Lab and Radiology Results  X-ray images right knee obtained today personally and independently interpreted. Mild medial DJD.  No acute fractures. Await formal radiology review     Assessment and Plan: 63 y.o. female with ear tenderness and pain to the anterior knee.  Physical exam is most consistent with mild prepatellar bursitis.  However she has way more pain than I would expect for this.  She will not tolerate ultrasound and certainly not ultrasound guided injection  right now.  Plan for short course of prednisone and Voltaren gel.  I did prescribe limited hydrocodone for pain control.  Additionally prior to discharge we gave her a Toradol shot and a Depo-Medrol IM injection.  She will schedule with me later this week to check-in if she is not better.  We could proceed with a steroid injection into the bursa at that time. Will go ahead and check uric acid.  This could be a gout flare.  I do not know where her uric acid level is as it has been a year since it was last checked.  She continues to take allopurinol.  PDMP reviewed during this encounter. Orders Placed This Encounter  Procedures   DG Knee AP/LAT W/[facility] Right    Standing Status:   Future    Number of Occurrences:   1    Expiration Date:   04/05/2024    Reason for Exam (SYMPTOM  OR DIAGNOSIS REQUIRED):   right  knee pain    Preferred imaging location?:   [facility]    Is patient pregnant?:   No   Uric acid    Standing Status:   Future    Number of Occurrences:   1    Expiration Date:   03/06/2025   Meds ordered this encounter  Medications   predniSONE (DELTASONE) 50 MG tablet    Sig: Take 1 tablet (50 mg total) by mouth daily.    Dispense:  5 tablet  Refill:  0   HYDROcodone-acetaminophen (NORCO/VICODIN) 5-325 MG tablet    Sig: Take 1 tablet by mouth every 6 (six) hours as needed.    Dispense:  15 tablet    Refill:  0   methylPREDNISolone acetate (DEPO-MEDROL) injection 80 mg   ketorolac (TORADOL) injection 60 mg     Discussed warning signs or symptoms. Please see discharge instructions. Patient expresses understanding.   The above documentation has been reviewed and is accurate and complete Lisa [name], M.D.

## 2024-03-06 NOTE — Patient Instructions (Signed)
 Thank you for coming in today.   Please get an Xray and stop by lab today before you leave.  IM Depo-Medrol 80 and Toradol 60.  See you back Thursday, OK to cancel if things improve.

## 2024-03-08 ENCOUNTER — Encounter: Payer: Self-pay | Admitting: Family Medicine

## 2024-03-08 MED ORDER — ALLOPURINOL 100 MG PO TABS: 200.0000 mg | ORAL_TABLET | Freq: Every day | ORAL | 1 refills | Status: AC

## 2024-03-08 NOTE — Addendum Note (Signed)
 Addended by: Rodolph Bong on: 03/08/2024 07:30 AM   Modules accepted: Orders

## 2024-03-08 NOTE — Progress Notes (Signed)
 Uric acid still a bit elevated at 6.9.  It is better than it was.  We want it less than 6.  I am increasing the allopurinol dose from 1 pill to 2 pills daily.

## 2024-03-09 ENCOUNTER — Ambulatory Visit: Admitting: Family Medicine

## 2024-03-16 ENCOUNTER — Ambulatory Visit: Payer: Self-pay

## 2024-03-16 NOTE — Telephone Encounter (Signed)
 Spoke to pt told her we do not any openings and only have 1 provider here this afternoon and we are closed tomorrow. Told pt due to hx of diverticulitis it would be best to go to an Urgent Care to be evaluated before you get worse. Pt verbalized understanding and said she will go to an Urgent Care.

## 2024-03-16 NOTE — Telephone Encounter (Signed)
 Chief Complaint: abdominal pain Symptoms: nausea (1 episode), lower medial abdominal pain, diarrhea (chronic, intermittent) Frequency: constant x 2 weeks Pertinent Negatives: Lisa [name] denies vomiting, fevers, flank or back pain. Disposition: [] ED /[x] Urgent Care (no appt availability in office) / [] Appointment(In office/virtual)/ []  [facility]/ [] Home Care/ [x] Refused Recommended Disposition /[] [facility]/ []  Follow-up with PCP Additional Notes: Lisa [name] states Lisa [name] was seen at the OBGYN on 03/06/24. Lisa [name] states they did a urine test and ruled out any vaginal issues. Lisa [name] states Lisa [name] has a history of GI problems with diverticulitis and "pockets of inflammation". Lisa [name] states Lisa [name] usually gets on antibiotics for this. No available appts with PCP or [facility] providers until next week. Advised urgent care and Lisa [name] states Lisa [name] will go if needed but would like to know if Lisa [name] could be fit in with her PCP clinic.  Copied from CRM [id number]. Topic: Clinical - Red Word Triage >> Mar 16, 2024 11:40 AM Lisa [name] wrote: [facility] that prompted transfer to Nurse Triage: pain in lower abdominal area between vaginal area and stomach Reason for Disposition  [1] MILD-MODERATE pain AND [2] constant AND [3] present > 2 hours  Answer Assessment - Initial Assessment Questions 1. LOCATION: "Where does it hurt?"      Lower medial abdomen, pelvic.   2. RADIATION: "Does the pain shoot anywhere else?" (e.g., chest, back)     Denies.  3. ONSET: "When did the pain begin?" (e.g., minutes, hours or days ago)      2 weeks ago, states it started off more intense. Lisa [name] states it is worsening.  4. SUDDEN: "Gradual or sudden onset?"     Sudden.  5. PATTERN "Does the pain come and go, or is it constant?"    - If it comes and goes: "How long does it last?" "Do you have pain now?"     (Note: Comes and goes means the pain is intermittent. It goes away completely between bouts.)    - If constant: "Is it getting  better, staying the same, or getting worse?"      (Note: Constant means the pain never goes away completely; most serious pain is constant and gets worse.)      Constant for the past couple of weeks.  6. SEVERITY: "How bad is the pain?"  (e.g., Scale 1-10; mild, moderate, or severe)    - MILD (1-3): Doesn't interfere with normal activities, abdomen soft and not tender to touch.     - MODERATE (4-7): Interferes with normal activities or awakens from sleep, abdomen tender to touch.     - SEVERE (8-10): Excruciating pain, doubled over, unable to do any normal activities.       3-4/10, when sitting. 0/10 when standing or walking.  7. RECURRENT SYMPTOM: "Have you ever had this type of stomach pain before?" If Yes, ask: "When was the last time?" and "What happened that time?"      Yes, Lisa [name] gets on antibiotics for this pain due to GI problems. Lisa [name] thinks the last time was October.  8. CAUSE: "What do you think is causing the stomach pain?"     Lisa [name] thought it was a UTI and was drinking cherry juice; was seen by her OBGYN who states it is not UTI or vaginal related. Lisa [name] states Lisa [name] has a history of "pocket of inflammation" in her GI tract. Lisa [name] states Lisa [name] gets this pain every few months and receives antibiotics.  9. RELIEVING/AGGRAVATING FACTORS: "What makes it better or worse?" (e.g.,  antacids, bending or twisting motion, bowel movement)     Lisa [name] states the pain worsens when Lisa [name] has to urinating and then relieves after urinating.  10. OTHER SYMPTOMS: "Do you have any other symptoms?" (e.g., back pain, diarrhea, fever, urination pain, vomiting)       Diarrhea (intermittent, states Lisa [name] has IBS so that is her baseline), nausea (1 episode).  11. PREGNANCY: "Is there any chance you are pregnant?" "When was your last menstrual period?"       N/A.  Protocols used: Abdominal Pain - Female-A-AH

## 2024-03-20 NOTE — Progress Notes (Signed)
Right knee x-ray shows a little bit of arthritis underneath the kneecap.

## 2024-04-01 ENCOUNTER — Other Ambulatory Visit: Payer: Self-pay | Admitting: Cardiovascular Disease

## 2024-04-04 NOTE — Telephone Encounter (Signed)
 Pt pharmacy requesting alternative. Medication not covered by insurance. Please address. Thank you

## 2024-04-13 ENCOUNTER — Telehealth: Payer: Self-pay

## 2024-04-13 NOTE — Telephone Encounter (Signed)
 Rc'd signed copy will fax and confirm.

## 2024-04-13 NOTE — Telephone Encounter (Signed)
 Cmn received and signed

## 2024-04-17 ENCOUNTER — Ambulatory Visit: Payer: Managed Care, Other (non HMO) | Admitting: Internal Medicine

## 2024-04-17 NOTE — Progress Notes (Deleted)
 Name: Lisa [name]  Age/ Sex: 64 y.o., female   MRN/ DOB: [id number], [birth year]     PCP: Almira Jaeger, MD   Reason for Endocrinology Evaluation: Type 2 Diabetes Mellitus/ Abnormal FNA  Initial Endocrine Consultative Visit: 05/23/2021    PATIENT IDENTIFIER: Lisa [name] is a 63 y.o. female with a past medical history of HTN, Migraine headaches, T2DM, diastolic dysfunction and PAD. The patient has followed with Endocrinology clinic since 05/23/2021 for consultative assistance with management of her diabetes.      DIABETIC HISTORY:  Lisa [name] was diagnosed with DM 2018. Was on Metformin  but was stopped due to low GFR.Ozempic  caused GI side effects . Her hemoglobin A1c has ranged from 6.5% in 02/2021, peaking at 9.5% in 08/2021.   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    SUBJECTIVE:   During the last visit (01/18/2024): A1c 10.5%    Today (04/17/2024): Lisa [name] is here for a follow up on diabetes management and MNG.  She checks her blood sugars 1 times daily. She did not bring her meter today .The patient has not had hypoglycemic episodes since the last clinic visit.   Denies palpitations  Denies local neck swelling  Follows with cardiology   HOME DIABETES REGIMEN:  Glipizide  5 mg  twice daily   Statin: no, she is on Praluent   ACE-I/ARB: yes Prior Diabetic Education: No   METER DOWNLOAD SUMMARY:       DIABETIC COMPLICATIONS:  Macrovascular complications:   Denies: CAD, CVA, PVD   HISTORY:  Past Medical History:  Past Medical History:  Diagnosis Date   Allergic rhinitis    Arthritis    Asthma    Breast nodule    right breast, to see dr Serge Dancer 06-20-2012 for    Chest pain, non-cardiac    Chronic headaches    Cigarette smoker    Complication of anesthesia 05/17/2012   trouble breathing after colonscopy, needed nebulizer   COPD (chronic obstructive pulmonary disease) (HCC)    Diverticulosis    GERD (gastroesophageal reflux disease)    Hepatic hemangioma    Hypertension    IBS (irritable bowel syndrome)    Nuclear stress test    Myoview  05/2019: EF 63, no ischemia. Low Risk   Obesity    Prosthetic eye globe    Pyloric erosion    Sleep apnea    CPAP setting varies from 4-10   Type 2 diabetes mellitus (HCC) 03/11/2017   Past Surgical History:  Past Surgical History:  Procedure Laterality Date   ABDOMINAL AORTOGRAM W/LOWER EXTREMITY N/A 04/30/2017   Procedure: Abdominal Aortogram w/Lower Extremity;  Surgeon: Richrd Char, MD;  Location: [facility];  Service: Cardiovascular;  Laterality: N/A;   CARDIAC CATHETERIZATION  12/28/2007   EF 75%. IT REVEALS NORMAL/SUPRANORMAL LEFT VENTRICULAR  SYSTOLIC FUNCTION   CARDIOVASCULAR STRESS TEST  03/25/2007   EF 78%   CESAREAN SECTION  2003, 2009   x 2   CHOLECYSTECTOMY  1993   COLONOSCOPY  2014   ENDOMETRIAL ABLATION  09/2010   and D&C   HERNIA REPAIR  2004   umbilical   LAPAROSCOPY     PERIPHERAL VASCULAR BALLOON ANGIOPLASTY Left 04/30/2017   Procedure: Peripheral Vascular Balloon Angioplasty;  Surgeon: Richrd Char, MD;  Location: [facility] CV [facility];  Service: Cardiovascular;  Laterality: Left;  SFA   RECONSTRUCTION OF EYELID Bilateral    TUBAL LIGATION     UMBILICAL HERNIA REPAIR   2004   US  ECHOCARDIOGRAPHY  05/08/2008   EF 55-60%   VENTRAL HERNIA REPAIR  06/15/2012   Procedure: LAPAROSCOPIC VENTRAL HERNIA;  Surgeon: Quitman Bucy, MD;  Location: [facility];  Service: General;  Laterality: N/A;  LAPAROSCOPIC REPAIR VENTRAL HERNIA   VIDEO BRONCHOSCOPY Bilateral 11/15/2017   Procedure: VIDEO BRONCHOSCOPY WITH FLUORO;  Surgeon: Denson Flake, MD;  Location: [facility];  Service: Cardiopulmonary;  Laterality: Bilateral;   Social History:  reports that she has been smoking cigarettes. She has a 57 pack-year smoking history. She has never used smokeless tobacco. She reports that she does not drink alcohol and does not use drugs. Family History:  Family History  Problem Relation Age of Onset   Hypertension Mother    Lung cancer Mother        57   Heart disease Father        pacemaker or defibrillator   Breast cancer Maternal Aunt 78   Heart attack Paternal Uncle    Diabetes Maternal Grandmother    Colon cancer Neg Hx    Esophageal cancer Neg Hx    Pancreatic cancer Neg Hx      HOME MEDICATIONS: Allergies as of 04/17/2024       Reactions   Almond Oil Anaphylaxis, Nausea And Vomiting   Almond-Nuts Only   Apple Juice Anaphylaxis   Ceftin  [cefuroxime ] Other (See Comments)   Severe stomach pain - Diverticulitis flares up   Cherry Extract Anaphylaxis   Cherry fruit only   Other Anaphylaxis, Itching   Most fruits- Are NOT tolerated (PLEASE ASK PATIENT BEFORE GIVING, AS CERTAIN FRUITS ARE TOLERATED IN LIMITED QUANTITIES!!)   Guaifenesin  & Derivatives Other (See Comments)   Restlessness, jittery   Hctz [hydrochlorothiazide] Other (See Comments)   Causes a headache   Naproxen Other (See Comments)   Insomnia   Walnut Itching   Throat itching   Lisinopril  Cough           Medication List        Accurate as of Apr 17, 2024 12:56 PM. If you have any questions, ask your nurse or doctor.          albuterol  108 (90 Base) MCG/ACT inhaler Commonly known  as: VENTOLIN  HFA Inhale 2 puffs into the lungs every 6 (six) hours as needed for wheezing or shortness of breath (or cough.).   allopurinol  100 MG tablet Commonly known as: ZYLOPRIM  Take 2 tablets (200 mg total) by mouth daily.   ASPIRIN  81 PO Take 81 mg by mouth daily.   buPROPion  150 MG 24 hr tablet Commonly known as: WELLBUTRIN  XL TAKE 1 TABLET BY MOUTH EVERY DAY   carvedilol  25 MG tablet Commonly known as: COREG  TAKE 1 TABLET BY MOUTH TWICE A DAY   glipiZIDE  5 MG tablet Commonly known as: GLUCOTROL  Take 1 tablet (5  mg total) by mouth 2 (two) times daily before a meal.   HYDROcodone -acetaminophen  5-325 MG tablet Commonly known as: NORCO/VICODIN Take 1 tablet by mouth every 6 (six) hours as needed.   lidocaine  2 % solution Commonly known as: XYLOCAINE  Use as directed 15 mLs in the mouth or throat every 3 (three) hours as needed for mouth pain (gargle; may spit or swallow).   losartan  100 MG tablet Commonly known as: COZAAR  Take 100 mg by mouth.   ondansetron  4 MG disintegrating tablet Commonly known as: ZOFRAN -ODT Take 1 tablet (4 mg total) by mouth every 8 (eight) hours as needed for nausea or vomiting.   ONE TOUCH LANCETS Misc Use to check blood sugars twice a day   pantoprazole  40 MG tablet Commonly known as: PROTONIX  TAKE 1 TABLET BY MOUTH 30 MINUTES BEFORE BREAKFAST DAILY   predniSONE  50 MG tablet Commonly known as: DELTASONE  Take 1 tablet (50 mg total) by mouth daily.   Repatha  SureClick 140 MG/ML Soaj Generic drug: Evolocumab  INJECT 140 MG INTO THE SKIN EVERY 14 (FOURTEEN) DAYS.   telmisartan  80 MG tablet Commonly known as: MICARDIS  TAKE 1 TABLET (80 MG TOTAL) BY MOUTH DAILY. UNTIL CAN GET LOSARTAN    telmisartan  40 MG tablet Commonly known as: MICARDIS  TAKE 1 TABLET BY MOUTH EVERY DAY         OBJECTIVE:   Vital Signs: There were no vitals taken for this visit.  Wt Readings from Last 3 Encounters:  01/24/24 246 lb 6.4 oz (111.8 kg)   01/18/24 250 lb 6.4 oz (113.6 kg)  12/23/23 259 lb 12.8 oz (117.8 kg)     Exam: General: Pt appears well and is in NAD  Neck: General: Supple without adenopathy. Thyroid : Thyroid  size normal.  Bilateral thyroid  nodules appreciated  Lungs: Clear with good BS bilat with no rales, rhonchi, or wheezes  Heart: RRR   Extremities: No pretibial edema.   Neuro: MS is good with appropriate affect, pt is alert and Ox3      DM Foot Exam 07/16/2023 The skin of the feet is intact without sores or ulcerations. The pedal pulses are 2+ on right and 2+ on left. The sensation is intact to a screening 5.07, 10 gram monofilament bilaterally  DATA REVIEWED:  Lab Results  Component Value Date   HGBA1C 10.5 (H) 01/06/2024   HGBA1C 9.6 (H) 09/07/2023   HGBA1C 7.9 (A) 07/16/2023    Latest Reference Range & Units 01/16/24 05:47  Sodium 135 - 145 mmol/L 137  Potassium 3.5 - 5.1 mmol/L 3.3 (L)  Chloride 98 - 111 mmol/L 103  CO2 22 - 32 mmol/L 24  Glucose 70 - 99 mg/dL 161 (H)  BUN 6 - 20 mg/dL 11  Creatinine 0.96 - 0.45 mg/dL 4.09  Calcium  8.9 - 10.3 mg/dL 9.1  Anion gap 5 - 15  10  GFR, Estimated >60 mL/min >60  (L): Data is abnormally low (H): Data is abnormally high    Thyroid  Ultrasound 08/05/2023  Estimated total number of nodules >/= 1 cm: 3   Number of spongiform nodules >/=  2 cm not described below (TR1): 0   Number of mixed cystic and solid nodules >/= 1.5 cm not described below (TR2): 0   _________________________________________________________   Nodule labeled 1 inferior right thyroid  measures 1.0 cm. Spongiform characteristics and does not meet criteria for surveillance.   Nodule labeled 2, superior left thyroid , 1.2 cm. Nodule has TR 4 characteristics and meets criteria for surveillance.   Nodule labeled 3  inferior left thyroid , 4.2 cm. Nodule has been previously biopsied. Assuming benign result no further specific follow-up would be indicated.   No adenopathy    IMPRESSION: Left superior thyroid  nodule (labeled 2, 1.2 cm) meets criteria for surveillance, as designated by the newly established ACR TI-RADS criteria. Surveillance ultrasound study recommended to be performed annually up to 5 years.   FNA 04/22/2021   Clinical History: Left; Inferior 2.6cm; Other 2 dimensions: 2.4 x 2.0cm,  Solid /almost completely solid, Hypoechoic, TI-RADS total points 4.  Specimen Submitted:  A. 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 Goal A1c < 7.0 %.    -She has been on metformin  in the past, I suspect this has been discontinued due to low GFR at some point  -She has been on Ozempic  but this caused GI issues  -Intolerant to Farxiga  due to severe yeast infection -She has done well with Trulicity , but due to shortage in supply she was switched to Mounjaro  with continued difficulty in obtaining it through the pharmacy -Due to her recent small bowel obstruction, I will recommend holding off on Mounjaro  at this time and we will use sulfonylurea -She may restart the Mounjaro  after a month if her GI symptoms have completely resolved by then  MEDICATIONS: Start glipizide  5 mg twice daily  EDUCATION / INSTRUCTIONS: BG monitoring instructions: Patient is instructed to check her blood sugars 1 times a day, fasting. Call [facility] if: BG persistently < 70 I reviewed the Rule of 15 for the treatment of hypoglycemia in detail with the patient. Literature supplied.     2) Multinodular Goiter:   - Pt is clinically and biochemically euthyroid  - NO local neck symptoms  - FNA of the left inferior 2.6 cm revealed hurthle cell lesion and/or neoplasm (Bethesda  category IV) , Afirma came back benign with less then 4% risk of cancer.  -Will repeat thyroid  ultrasound later  this year - TFT's remain normal     F/U in 6 months   Signed electronically by: Natale Bail, MD  [facility]  [facility] [address] [place] [place], [place] [address] Phone: [phone] FAX: [phone]   CC: Almira Jaeger, MD [address] [place] [address] Phone: [phone]  Fax: [phone]  Return to Endocrinology clinic as below: Future Appointments  Date Time Provider Department Center  04/17/2024  2:00 PM Branden Vine, Julian Obey, MD [facility] None  06/15/2024  2:20 PM Almira Jaeger, MD [facility]

## 2024-04-28 ENCOUNTER — Other Ambulatory Visit: Payer: Self-pay | Admitting: Psychiatry

## 2024-04-28 ENCOUNTER — Other Ambulatory Visit: Payer: Self-pay | Admitting: *Deleted

## 2024-04-28 DIAGNOSIS — R1033 Periumbilical pain: Secondary | ICD-10-CM

## 2024-04-28 DIAGNOSIS — R140 Abdominal distension (gaseous): Secondary | ICD-10-CM

## 2024-04-28 DIAGNOSIS — R933 Abnormal findings on diagnostic imaging of other parts of digestive tract: Secondary | ICD-10-CM

## 2024-04-28 DIAGNOSIS — Z860100 Personal history of colon polyps, unspecified: Secondary | ICD-10-CM

## 2024-04-28 DIAGNOSIS — R1030 Lower abdominal pain, unspecified: Secondary | ICD-10-CM

## 2024-05-01 ENCOUNTER — Encounter: Payer: Self-pay | Admitting: Internal Medicine

## 2024-05-01 ENCOUNTER — Ambulatory Visit (INDEPENDENT_AMBULATORY_CARE_PROVIDER_SITE_OTHER): Admitting: Internal Medicine

## 2024-05-01 VITALS — BP 120/78 | HR 98 | Ht 66.0 in | Wt 249.0 lb

## 2024-05-01 DIAGNOSIS — Z7984 Long term (current) use of oral hypoglycemic drugs: Secondary | ICD-10-CM

## 2024-05-01 DIAGNOSIS — E042 Nontoxic multinodular goiter: Secondary | ICD-10-CM

## 2024-05-01 DIAGNOSIS — N1831 Chronic kidney disease, stage 3a: Secondary | ICD-10-CM

## 2024-05-01 DIAGNOSIS — E1122 Type 2 diabetes mellitus with diabetic chronic kidney disease: Secondary | ICD-10-CM

## 2024-05-01 DIAGNOSIS — E1165 Type 2 diabetes mellitus with hyperglycemia: Secondary | ICD-10-CM | POA: Diagnosis not present

## 2024-05-01 LAB — POCT GLYCOSYLATED HEMOGLOBIN (HGB A1C): Hemoglobin A1C: 8.8 % — AB (ref 4.0–5.6)

## 2024-05-01 MED ORDER — GLIPIZIDE 5 MG PO TABS: ORAL_TABLET | ORAL | 3 refills | Status: DC

## 2024-05-01 NOTE — Progress Notes (Signed)
 Name: Lisa [name]  Age/ Sex: 63 y.o., female   MRN/ DOB: [id number], 09-Aug-1961     PCP: Almira Jaeger, MD   Reason for Endocrinology Evaluation: Type 2 Diabetes Mellitus/ Abnormal FNA  Initial Endocrine Consultative Visit: 05/23/2021    PATIENT IDENTIFIER: Lisa [name] is a 63 y.o. female with a past medical history of HTN, Migraine headaches, T2DM, diastolic dysfunction and PAD. The patient has followed with Endocrinology clinic since 05/23/2021 for consultative assistance with management of her diabetes.      DIABETIC HISTORY:  [name] was diagnosed with DM 2018. Was on Metformin  but was stopped due to low GFR.Ozempic  caused GI side effects . Her hemoglobin A1c has ranged from 6.5% in 02/2021, peaking at 9.5% in 08/2021.   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    SUBJECTIVE:   During the last visit (01/18/2024): A1c 10.5%    Today (05/01/2024): Lisa [name] is here for a follow up on diabetes management and MNG.  She checks her blood sugars 1 times daily. She did not bring her meter today .The patient has not had hypoglycemic episodes since the last clinic visit.  She follows with [facility] for right knee pain She continues with occasional abdominal pain  She  is  scheduled for colonoscopy tomorrow, she is currently on liquid diet, but has been drinking juices, regular sodas and popsicle   No local  neck swelling  Denies palpitations  Has diarrhea but no constipation     HOME DIABETES REGIMEN:  Glipizide  5 mg twice daily   Statin: no, she is on Praluent   ACE-I/ARB: yes Prior Diabetic Education: No   METER DOWNLOAD SUMMARY:  Fingerstick Blood Glucose Tests = 3 Average Number Tests/Day = 0 Overall Mean FS Glucose = 172 Standard Deviation = 24.9  BG Ranges: Low = 125 High = 209  BG Target % Results: % In target = 33 % Over target = 67 % Under target = 0  Hypoglycemic Events/30 Days: BG < 50 = 0 Episodes of symptomatic severe hypoglycemia = 0      DIABETIC COMPLICATIONS:  Macrovascular complications:   Denies: CAD, CVA, PVD   HISTORY:  Past Medical History:  Past Medical History:  Diagnosis Date   Allergic rhinitis    Arthritis    Asthma    Breast nodule    right breast, to see dr Serge Dancer 06-20-2012 for    Chest pain, non-cardiac    Chronic headaches    Cigarette smoker    Complication of anesthesia 05/17/2012   trouble breathing after colonscopy, needed nebulizer   COPD (chronic obstructive pulmonary disease) (HCC)    Diverticulosis    GERD (gastroesophageal reflux disease)    Hepatic hemangioma    Hypertension    IBS (irritable bowel syndrome)  Nuclear stress test    Myoview  05/2019: EF 63, no ischemia. Low Risk   Obesity    Prosthetic eye globe    Pyloric erosion    Sleep apnea    CPAP setting varies from 4-10   Type 2 diabetes mellitus (HCC) 03/11/2017   Past Surgical History:  Past Surgical History:  Procedure Laterality Date   ABDOMINAL AORTOGRAM W/LOWER EXTREMITY N/A 04/30/2017   Procedure: Abdominal Aortogram w/Lower Extremity;  Surgeon: Richrd Char, MD;  Location: [facility];  Service: Cardiovascular;  Laterality: N/A;   CARDIAC CATHETERIZATION  12/28/2007   EF 75%. IT REVEALS  NORMAL/SUPRANORMAL LEFT VENTRICULAR SYSTOLIC FUNCTION   CARDIOVASCULAR STRESS TEST  03/25/2007   EF 78%   CESAREAN SECTION  2003, 2009   x 2   CHOLECYSTECTOMY  1993   COLONOSCOPY  2014   ENDOMETRIAL ABLATION  09/2010   and D&C   HERNIA REPAIR  2004   umbilical   LAPAROSCOPY     PERIPHERAL VASCULAR BALLOON ANGIOPLASTY Left 04/30/2017   Procedure: Peripheral Vascular Balloon Angioplasty;  Surgeon: Richrd Char, MD;  Location: [facility]IVE CV [facility];  Service: Cardiovascular;  Laterality: Left;  SFA   RECONSTRUCTION OF EYELID Bilateral    TUBAL LIGATION     UMBILICAL HERNIA REPAIR  2004   US  ECHOCARDIOGRAPHY  05/08/2008   EF 55-60%   VENTRAL HERNIA REPAIR  06/15/2012   Procedure: LAPAROSCOPIC VENTRAL HERNIA;  Surgeon: Quitman Bucy, MD;  Location: [facility];  Service: General;  Laterality: N/A;  LAPAROSCOPIC REPAIR VENTRAL HERNIA   VIDEO BRONCHOSCOPY Bilateral 11/15/2017   Procedure: VIDEO BRONCHOSCOPY WITH FLUORO;  Surgeon: Denson Flake, MD;  Location: [facility];  Service: Cardiopulmonary;  Laterality: Bilateral;   Social History:  reports that she has been smoking cigarettes. She has a 57 pack-year smoking history. She has never used smokeless tobacco. She reports that she does not drink alcohol and does not use drugs. Family History:  Family History  Problem Relation Age of Onset   Hypertension Mother    Lung cancer Mother        62   Heart disease Father        pacemaker or defibrillator   Breast cancer Maternal Aunt 78   Heart attack Paternal Uncle    Diabetes Maternal Grandmother    Colon cancer Neg Hx    Esophageal cancer Neg Hx    Pancreatic cancer Neg Hx      HOME MEDICATIONS: Allergies as of 05/01/2024       Reactions   Almond Oil Anaphylaxis, Nausea And Vomiting   Almond-Nuts Only   Apple Juice Anaphylaxis   Ceftin  [cefuroxime ] Other (See Comments)   Severe stomach pain - Diverticulitis flares up   Cherry Extract Anaphylaxis   Cherry fruit only   Other  Anaphylaxis, Itching   Most fruits- Are NOT tolerated (PLEASE ASK PATIENT BEFORE GIVING, AS CERTAIN FRUITS ARE TOLERATED IN LIMITED QUANTITIES!!)   Guaifenesin  & Derivatives Other (See Comments)   Restlessness, jittery   Hctz [hydrochlorothiazide] Other (See Comments)   Causes a headache   Naproxen Other (See Comments)   Insomnia   Walnut Itching   Throat itching   Lisinopril  Cough           Medication List        Accurate as of May 01, 2024  2:47 PM. If you have any questions, ask your nurse or doctor.          albuterol  108 (  90 Base) MCG/ACT inhaler Commonly known as: VENTOLIN  HFA Inhale 2 puffs into the lungs every 6 (six) hours as needed for wheezing or shortness of breath (or cough.).   allopurinol  100 MG tablet Commonly known as: ZYLOPRIM  Take 2 tablets (200 mg total) by mouth daily.   ASPIRIN  81 PO Take 81 mg by mouth daily.   buPROPion  150 MG 24 hr tablet Commonly known as: WELLBUTRIN  XL TAKE 1 TABLET BY MOUTH EVERY DAY   carvedilol  25 MG tablet Commonly known as: COREG  TAKE 1 TABLET BY MOUTH TWICE A DAY   glipiZIDE  5 MG tablet Commonly known as: GLUCOTROL  Take 1 tablet (5 mg total) by mouth 2 (two) times daily before a meal.   HYDROcodone -acetaminophen  5-325 MG tablet Commonly known as: NORCO/VICODIN Take 1 tablet by mouth every 6 (six) hours as needed.   lidocaine  2 % solution Commonly known as: XYLOCAINE  Use as directed 15 mLs in the mouth or throat every 3 (three) hours as needed for mouth pain (gargle; may spit or swallow).   losartan  100 MG tablet Commonly known as: COZAAR  Take 100 mg by mouth.   ondansetron  4 MG disintegrating tablet Commonly known as: ZOFRAN -ODT Take 1 tablet (4 mg total) by mouth every 8 (eight) hours as needed for nausea or vomiting.   ONE TOUCH LANCETS Misc Use to check blood sugars twice a day   pantoprazole  40 MG tablet Commonly known as: PROTONIX  TAKE 1 TABLET BY MOUTH 30 MINUTES BEFORE BREAKFAST DAILY    predniSONE  50 MG tablet Commonly known as: DELTASONE  Take 1 tablet (50 mg total) by mouth daily.   Repatha  SureClick 140 MG/ML Soaj Generic drug: Evolocumab  INJECT 140 MG INTO THE SKIN EVERY 14 (FOURTEEN) DAYS.   telmisartan  80 MG tablet Commonly known as: MICARDIS  TAKE 1 TABLET (80 MG TOTAL) BY MOUTH DAILY. UNTIL CAN GET LOSARTAN    telmisartan  40 MG tablet Commonly known as: MICARDIS  TAKE 1 TABLET BY MOUTH EVERY DAY         OBJECTIVE:   Vital Signs: BP 120/78 (BP Location: Left Arm, Patient Position: Sitting, Cuff Size: Normal)   Pulse 98   Ht 5\' 6"  (1.676 m)   Wt 249 lb (112.9 kg)   SpO2 (!) 74%   BMI 40.19 kg/m   Wt Readings from Last 3 Encounters:  05/01/24 249 lb (112.9 kg)  01/24/24 246 lb 6.4 oz (111.8 kg)  01/18/24 250 lb 6.4 oz (113.6 kg)     Exam: General: Pt appears well and is in NAD  Neck: General: Supple without adenopathy. Thyroid : Thyroid  size normal.  Bilateral thyroid  nodules appreciated  Lungs: Clear with good BS bilat with no rales, rhonchi, or wheezes  Heart: RRR   Extremities: No pretibial edema.   Neuro: MS is good with appropriate affect, pt is alert and Ox3      DM Foot Exam 05/01/2024 The skin of the feet is intact without sores or ulcerations. The pedal pulses are 2+ on right and 2+ on left. The sensation is intact to a screening 5.07, 10 gram monofilament bilaterally  DATA REVIEWED:  Lab Results  Component Value Date   HGBA1C 10.5 (H) 01/06/2024   HGBA1C 9.6 (H) 09/07/2023   HGBA1C 7.9 (A) 07/16/2023    Latest Reference Range & Units 01/16/24 05:47  Sodium 135 - 145 mmol/L 137  Potassium 3.5 - 5.1 mmol/L 3.3 (L)  Chloride 98 - 111 mmol/L 103  CO2 22 - 32 mmol/L 24  Glucose 70 - 99 mg/dL 409 (H)  BUN 6 - 20 mg/dL 11  Creatinine 1.61 - 0.96 mg/dL 0.45  Calcium  8.9 - 10.3 mg/dL 9.1  Anion gap 5 - 15  10  GFR, Estimated >60 mL/min >60     Thyroid  Ultrasound 08/05/2023  Estimated total number of nodules >/= 1 cm: 3    Number of spongiform nodules >/=  2 cm not described below (TR1): 0   Number of mixed cystic and solid nodules >/= 1.5 cm not described below (TR2): 0   _________________________________________________________   Nodule labeled 1 inferior right thyroid  measures 1.0 cm. Spongiform characteristics and does not meet criteria for surveillance.   Nodule labeled 2, superior left thyroid , 1.2 cm. Nodule has TR 4 characteristics and meets criteria for surveillance.   Nodule labeled 3 inferior left thyroid , 4.2 cm. Nodule has been previously biopsied. Assuming benign result no further specific follow-up would be indicated.   No adenopathy   IMPRESSION: Left superior thyroid  nodule (labeled 2, 1.2 cm) meets criteria for surveillance, as designated by the newly established ACR TI-RADS criteria. Surveillance ultrasound study recommended to be performed annually up to 5 years.   FNA 04/22/2021   Clinical History: Left; Inferior 2.6cm; Other 2 dimensions: 2.4 x 2.0cm,  Solid /almost completely solid, Hypoechoic, TI-RADS total points 4.  Specimen Submitted:  A. 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 Goal A1c < 7.0 %.     -A1c has trended down from 10.5% to 8.8% -She has been on metformin  in the past, I suspect this has been discontinued due to low GFR at some point  -She has been on Ozempic  but this caused GI issues  -Intolerant to Farxiga  due to severe yeast infection -She has done well with Trulicity , but due to shortage in supply she was switched to Mounjaro  , which was discontinued after developing small bowel obstruction 01/2024 - Patient has been noted with high BG's in the morning, will  increase evening glipizide .  Emphasized the importance of eating within 15-20 minutes of taking glipizide    MEDICATIONS: Take glipizide  5 mg, 1 tablet before breakfast and 2 tablets before supper EDUCATION / INSTRUCTIONS: BG monitoring instructions: Patient is instructed to check her blood sugars 1 times a day, fasting. Call [facility] if: BG persistently < 70 I reviewed the Rule of 15 for the treatment of hypoglycemia in detail with the patient. Literature supplied.     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  -Will repeat thyroid  ultrasound  this year - TFT's remain normal     3) CKD III/microalbuminuria:  - Patient on maximum dose of losartan  - Will continue to improve glucose control - Will continue to monitor  F/U in 6 months   Signed electronically by: Natale Bail, MD  [facility]  [facility] [address] [place] [place], [place] [address] Phone: [phone] FAX: [phone]   CC: Almira Jaeger, MD [address] [place] [address] Phone: [phone]  Fax: [phone]  Return to Endocrinology clinic as below: Future Appointments  Date Time Provider Department Center  05/12/2024  2:00 PM [facility] 1 [facility]  06/15/2024  2:20 PM Arlene Ben Saverio Curling, MD [facility]

## 2024-05-01 NOTE — Patient Instructions (Signed)
 Take Glipizide  5 mg , 1 tablet before breakfast and 2 tablets before Supper   HOW TO TREAT LOW BLOOD SUGARS (Blood sugar LESS THAN 70 MG/DL) Please follow the RULE OF 15 for the treatment of hypoglycemia treatment (when your (blood sugars are less than 70 mg/dL)   STEP 1: Take 15 grams of carbohydrates when your blood sugar is low, which includes:  3-4 GLUCOSE TABS  OR 3-4 OZ OF JUICE OR REGULAR SODA OR ONE TUBE OF GLUCOSE GEL    STEP 2: RECHECK blood sugar in 15 MINUTES STEP 3: If your blood sugar is still low at the 15 minute recheck --> then, go back to STEP 1 and treat AGAIN with another 15 grams of carbohydrates.

## 2024-05-02 ENCOUNTER — Ambulatory Visit: Payer: Self-pay | Admitting: Internal Medicine

## 2024-05-03 LAB — CALCITONIN: Calcitonin: 2 pg/mL (ref <=5)

## 2024-05-03 LAB — MICROALBUMIN / CREATININE URINE RATIO
Creatinine, Urine: 141 mg/dL (ref 20–275)
Microalb Creat Ratio: 33 mg/g{creat} — ABNORMAL HIGH (ref <30)
Microalb, Ur: 4.6 mg/dL

## 2024-05-05 ENCOUNTER — Ambulatory Visit
Admission: RE | Admit: 2024-05-05 | Discharge: 2024-05-05 | Disposition: A | Discharge status: Home / Self Care | Source: Ambulatory Visit | Attending: Internal Medicine | Admitting: Internal Medicine

## 2024-05-05 DIAGNOSIS — E042 Nontoxic multinodular goiter: Secondary | ICD-10-CM

## 2024-05-11 ENCOUNTER — Encounter: Payer: Self-pay | Admitting: Cardiovascular Disease

## 2024-05-12 ENCOUNTER — Telehealth: Payer: Self-pay

## 2024-05-12 ENCOUNTER — Ambulatory Visit
Admission: RE | Admit: 2024-05-12 | Discharge: 2024-05-12 | Disposition: A | Discharge status: Home / Self Care | Source: Ambulatory Visit | Attending: *Deleted

## 2024-05-12 DIAGNOSIS — Z860100 Personal history of colon polyps, unspecified: Secondary | ICD-10-CM

## 2024-05-12 DIAGNOSIS — R933 Abnormal findings on diagnostic imaging of other parts of digestive tract: Secondary | ICD-10-CM

## 2024-05-12 DIAGNOSIS — R1030 Lower abdominal pain, unspecified: Secondary | ICD-10-CM

## 2024-05-12 DIAGNOSIS — R140 Abdominal distension (gaseous): Secondary | ICD-10-CM

## 2024-05-12 DIAGNOSIS — R1033 Periumbilical pain: Secondary | ICD-10-CM

## 2024-05-12 NOTE — Telephone Encounter (Signed)
 FYI.  Copied from CRM [id number]. Topic: General - Other >> May 12, 2024  1:32 PM Baldo Levan wrote: Reason for CRM: Gurney Lefort from [facility] calling in stating that the tech was not able to access the patients veins for the contrast, patient needs to be scheduled at the hospital for imagine guiding. They were not able to complete the testing.

## 2024-05-12 NOTE — Telephone Encounter (Signed)
 Can they reorder using the last order? Or is new order needed ?

## 2024-05-15 NOTE — Telephone Encounter (Signed)
 If this is related to the CT entero that was ordered by her gastroenterologist and she needs to reach out to them to have the reorder

## 2024-05-15 NOTE — Telephone Encounter (Signed)
 A new order will be needed for the hospital.

## 2024-06-15 ENCOUNTER — Other Ambulatory Visit: Payer: Self-pay | Admitting: Acute Care

## 2024-06-15 ENCOUNTER — Encounter: Payer: Managed Care, Other (non HMO) | Admitting: Family Medicine

## 2024-06-15 DIAGNOSIS — F17210 Nicotine dependence, cigarettes, uncomplicated: Secondary | ICD-10-CM

## 2024-06-15 DIAGNOSIS — Z87891 Personal history of nicotine dependence: Secondary | ICD-10-CM

## 2024-06-16 ENCOUNTER — Telehealth: Payer: Self-pay

## 2024-06-16 NOTE — Telephone Encounter (Signed)
 Please schedule ov to discuss.  Copied from CRM [id number]. Topic: Appointments - Scheduling Inquiry for Clinic >> Jun 16, 2024 10:15 AM Jasmin G wrote: Reason for CRM: Pt. Was seen by gastroenterologist on July 31st and got a CT scan done, they found a mass on her right breast and was recommended to get a mammogram done. Please call pt. Back to schedule appt.

## 2024-06-23 ENCOUNTER — Ambulatory Visit: Admitting: Family Medicine

## 2024-06-26 ENCOUNTER — Telehealth (INDEPENDENT_AMBULATORY_CARE_PROVIDER_SITE_OTHER): Admitting: Family Medicine

## 2024-06-26 ENCOUNTER — Encounter: Payer: Self-pay | Admitting: Family Medicine

## 2024-06-26 VITALS — Ht 66.0 in | Wt 255.0 lb

## 2024-06-26 DIAGNOSIS — N6310 Unspecified lump in the right breast, unspecified quadrant: Secondary | ICD-10-CM | POA: Diagnosis not present

## 2024-06-26 NOTE — Progress Notes (Signed)
   Lisa [name] is a 63 y.o. female who presents today for a virtual office visit.  Assessment/Plan:  Right breast mass Reviewed her recent CT scan which is available in Care Everywhere which does show 1.7 cm mass in right breast.  She did have a normal mammogram earlier this year though this does need further imaging at this point.  Will place referral for diagnostic mammogram and ultrasound.  She will let us  know if they have not called her to schedule this soon.     Subjective:  HPI:  Patient here to discuss recent CT scan.  She has been following with gastroenterology at [facility].  This was notable for incidental finding of 1.7 cm mass in her right breast.  Patient does report having a mammogram a couple of months ago which was normal.       Objective/Observations  Physical Exam: Gen: NAD, resting comfortably Pulm: Normal work of breathing Neuro: Grossly normal, moves all extremities Psych: Normal affect and thought content  Virtual Visit via Video   I connected with Lisa [name] on 06/26/24 at  1:00 PM EDT by a video enabled telemedicine application and verified that I am speaking with the correct person using two identifiers. The limitations of evaluation and management by telemedicine and the availability of in person appointments were discussed. The patient expressed understanding and agreed to proceed.   Patient location: Home Provider location:  [facility] [employer] Persons participating in the virtual visit: Myself and Patient     [employer] HERO. Kennyth, MD 06/26/2024 1:05 PM

## 2024-06-29 ENCOUNTER — Inpatient Hospital Stay: Admission: RE | Admit: 2024-06-29 | Source: Ambulatory Visit

## 2024-06-29 ENCOUNTER — Ambulatory Visit
Admission: RE | Admit: 2024-06-29 | Discharge: 2024-06-29 | Disposition: A | Discharge status: Home / Self Care | Source: Ambulatory Visit | Attending: Acute Care | Admitting: Acute Care

## 2024-06-29 DIAGNOSIS — F17210 Nicotine dependence, cigarettes, uncomplicated: Secondary | ICD-10-CM

## 2024-06-29 DIAGNOSIS — Z87891 Personal history of nicotine dependence: Secondary | ICD-10-CM

## 2024-08-01 ENCOUNTER — Ambulatory Visit: Admitting: Internal Medicine

## 2024-08-01 NOTE — Progress Notes (Deleted)
 Name: Lisa [name]  Age/ Sex: 63 y.o., female   MRN/ DOB: [id number], Jan 17, 1961     PCP: Katrinka Garnette KIDD, MD   Reason for Endocrinology Evaluation: Type 2 Diabetes Mellitus/ Abnormal FNA  Initial Endocrine Consultative Visit: 05/23/2021    PATIENT IDENTIFIER: Lisa [name] is a 63 y.o. female with a past medical history of HTN, Migraine headaches, T2DM, diastolic dysfunction and PAD. The patient has followed with Endocrinology clinic since 05/23/2021 for consultative assistance with management of her diabetes.      DIABETIC HISTORY:  Lisa [name] was diagnosed with DM 2018. Was on Metformin  but was stopped due to low GFR.Ozempic  caused GI side effects . Her hemoglobin A1c has ranged from 6.5% in 02/2021, peaking at 9.5% in 08/2021.   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    ADRENAL HISTORY: Patient was noted with bilateral nodular thickening of adrenal glands on CT imaging of abdomen/pelvis July, 2025, and evaluation for abdominal pain    SUBJECTIVE:   During the last visit (05/01/2024): A1c 8.8%    Today (08/01/2024): Lisa [name] is here for a follow up on diabetes management and MNG.  She checks her blood sugars 1 times daily. She did not bring her meter today .The patient has not had  hypoglycemic episodes since the last clinic visit.  She was recently evaluated by her PCP for a right breast mass 1.7 cm that was incidentally noted on abdominal/pelvic CT scan She was also noted with bilateral nodular thickening of adrenal glands  No local  neck swelling  Denies palpitations  Has diarrhea but no constipation     HOME DIABETES REGIMEN:  Glipizide  5 mg , 1 tablet before breakfast and 2 tablets before supper   Statin: no, she is on Praluent   ACE-I/ARB: yes Prior Diabetic Education: No   METER DOWNLOAD SUMMARY:  Fingerstick Blood Glucose Tests = 3 Average Number Tests/Day = 0 Overall Mean FS Glucose = 172 Standard Deviation = 24.9  BG Ranges: Low = 125 High = 209  BG Target % Results: % In target = 33 % Over target = 67 % Under target = 0  Hypoglycemic Events/30 Days: BG < 50 = 0 Episodes of symptomatic severe hypoglycemia = 0      DIABETIC COMPLICATIONS:  Macrovascular complications:   Denies: CAD, CVA, PVD   HISTORY:  Past Medical History:  Past Medical History:  Diagnosis Date   Allergic rhinitis    Arthritis    Asthma    Breast nodule    right breast, to see dr mat 06-20-2012 for    Chest pain, non-cardiac    Chronic headaches    Cigarette smoker    Complication of anesthesia 05/17/2012   trouble breathing after colonscopy, needed nebulizer   COPD (chronic obstructive pulmonary disease) (  HCC)    Diverticulosis    GERD (gastroesophageal reflux disease)    Hepatic hemangioma    Hypertension    IBS (irritable bowel syndrome)    Nuclear stress test    Myoview  05/2019: EF 63, no ischemia. Low Risk   Obesity    Prosthetic eye globe    Pyloric erosion    Sleep apnea    CPAP setting varies from 4-10   Type 2 diabetes mellitus (HCC) 03/11/2017   Past Surgical History:  Past Surgical History:  Procedure Laterality Date   ABDOMINAL AORTOGRAM W/LOWER EXTREMITY N/A 04/30/2017   Procedure: Abdominal Aortogram w/Lower Extremity;   Surgeon: Harvey Carlin BRAVO, MD;  Location: [facility];  Service: Cardiovascular;  Laterality: N/A;   CARDIAC CATHETERIZATION  12/28/2007   EF 75%. IT REVEALS NORMAL/SUPRANORMAL LEFT VENTRICULAR SYSTOLIC FUNCTION   CARDIOVASCULAR STRESS TEST  03/25/2007   EF 78%   CESAREAN SECTION  2003, 2009   x 2   CHOLECYSTECTOMY  1993   COLONOSCOPY  2014   ENDOMETRIAL ABLATION  09/2010   and D&C   HERNIA REPAIR  2004   umbilical   LAPAROSCOPY     PERIPHERAL VASCULAR BALLOON ANGIOPLASTY Left 04/30/2017   Procedure: Peripheral Vascular Balloon Angioplasty;  Surgeon: Harvey Carlin BRAVO, MD;  Location: [facility] INVASIVE CV LAB;  Service: Cardiovascular;  Laterality: Left;  SFA   RECONSTRUCTION OF EYELID Bilateral    TUBAL LIGATION     UMBILICAL HERNIA REPAIR  2004   US  ECHOCARDIOGRAPHY  05/08/2008   EF 55-60%   VENTRAL HERNIA REPAIR  06/15/2012   Procedure: LAPAROSCOPIC VENTRAL HERNIA;  Surgeon: Morene ONEIDA Olives, MD;  Location: [facility];  Service: General;  Laterality: N/A;  LAPAROSCOPIC REPAIR VENTRAL HERNIA   VIDEO BRONCHOSCOPY Bilateral 11/15/2017   Procedure: VIDEO BRONCHOSCOPY WITH FLUORO;  Surgeon: Shelah Lamar RAMAN, MD;  Location: [facility];  Service: Cardiopulmonary;  Laterality: Bilateral;   Social History:  reports that she has been smoking cigarettes. She has a 57 pack-year smoking history. She has never used smokeless tobacco. She reports that she does not drink alcohol and does not use drugs. Family History:  Family History  Problem Relation Age of Onset   Hypertension Mother    Lung cancer Mother        33   Heart disease Father        pacemaker or defibrillator   Breast cancer Maternal Aunt 78   Heart attack Paternal Uncle    Diabetes Maternal Grandmother    Colon cancer Neg Hx    Esophageal cancer Neg Hx    Pancreatic cancer Neg Hx      HOME MEDICATIONS: Allergies as of 08/01/2024       Reactions   Almond Oil Anaphylaxis, Nausea And Vomiting   Almond-Nuts Only   Apple Juice  Anaphylaxis   Ceftin  [cefuroxime ] Other (See Comments)   Severe stomach pain - Diverticulitis flares up   Cherry Extract Anaphylaxis   Cherry fruit only   Other Anaphylaxis, Itching   Most fruits- Are NOT tolerated (PLEASE ASK PATIENT BEFORE GIVING, AS CERTAIN FRUITS ARE TOLERATED IN LIMITED QUANTITIES!!)   Guaifenesin  & Derivatives Other (See Comments)   Restlessness, jittery   Hctz [hydrochlorothiazide] Other (See Comments)   Causes a headache   Naproxen Other (See Comments)   Insomnia   Walnut Itching   Throat itching   Lisinopril  Cough           Medication List  Accurate as of August 01, 2024 12:25 PM. If you have any questions, ask your nurse or doctor.          albuterol  108 (90 Base) MCG/ACT inhaler Commonly known as: VENTOLIN  HFA Inhale 2 puffs into the lungs every 6 (six) hours as needed for wheezing or shortness of breath (or cough.).   allopurinol  100 MG tablet Commonly known as: ZYLOPRIM  Take 2 tablets (200 mg total) by mouth daily.   ASPIRIN  81 PO Take 81 mg by mouth daily.   buPROPion  150 MG 24 hr tablet Commonly known as: WELLBUTRIN  XL TAKE 1 TABLET BY MOUTH EVERY DAY   carvedilol  25 MG tablet Commonly known as: COREG  TAKE 1 TABLET BY MOUTH TWICE A DAY   glipiZIDE  5 MG tablet Commonly known as: GLUCOTROL  Take 1 tablet (5 mg total) by mouth daily before breakfast AND 2 tablets (10 mg total) daily before supper.   HYDROcodone -acetaminophen  5-325 MG tablet Commonly known as: NORCO/VICODIN Take 1 tablet by mouth every 6 (six) hours as needed.   lidocaine  2 % solution Commonly known as: XYLOCAINE  Use as directed 15 mLs in the mouth or throat every 3 (three) hours as needed for mouth pain (gargle; may spit or swallow).   losartan  100 MG tablet Commonly known as: COZAAR  Take 100 mg by mouth.   ondansetron  4 MG disintegrating tablet Commonly known as: ZOFRAN -ODT Take 1 tablet (4 mg total) by mouth every 8 (eight) hours as needed for  nausea or vomiting.   ONE TOUCH LANCETS Misc Use to check blood sugars twice a day   pantoprazole  40 MG tablet Commonly known as: PROTONIX  TAKE 1 TABLET BY MOUTH 30 MINUTES BEFORE BREAKFAST DAILY   predniSONE  50 MG tablet Commonly known as: DELTASONE  Take 1 tablet (50 mg total) by mouth daily.   Repatha  SureClick 140 MG/ML Soaj Generic drug: Evolocumab  INJECT 140 MG INTO THE SKIN EVERY 14 (FOURTEEN) DAYS.   telmisartan  80 MG tablet Commonly known as: MICARDIS  TAKE 1 TABLET (80 MG TOTAL) BY MOUTH DAILY. UNTIL CAN GET LOSARTAN    telmisartan  40 MG tablet Commonly known as: MICARDIS  TAKE 1 TABLET BY MOUTH EVERY DAY         OBJECTIVE:   Vital Signs: There were no vitals taken for this visit.  Wt Readings from Last 3 Encounters:  06/26/24 255 lb (115.7 kg)  05/01/24 249 lb (112.9 kg)  01/24/24 246 lb 6.4 oz (111.8 kg)     Exam: General: Pt appears well and is in NAD  Neck: General: Supple without adenopathy. Thyroid : Thyroid  size normal.  Bilateral thyroid  nodules appreciated  Lungs: Clear with good BS bilat with no rales, rhonchi, or wheezes  Heart: RRR   Extremities: No pretibial edema.   Neuro: MS is good with appropriate affect, pt is alert and Ox3      DM Foot Exam 05/01/2024 The skin of the feet is intact without sores or ulcerations. The pedal pulses are 2+ on right and 2+ on left. The sensation is intact to a screening 5.07, 10 gram monofilament bilaterally  DATA REVIEWED:  Lab Results  Component Value Date   HGBA1C 8.8 (A) 05/01/2024   HGBA1C 10.5 (H) 01/06/2024   HGBA1C 9.6 (H) 09/07/2023    Latest Reference Range & Units 01/16/24 05:47  Sodium 135 - 145 mmol/L 137  Potassium 3.5 - 5.1 mmol/L 3.3 (L)  Chloride 98 - 111 mmol/L 103  CO2 22 - 32 mmol/L 24  Glucose 70 - 99 mg/dL 859 (H)  BUN 6 - 20 mg/dL 11  Creatinine 9.55 - 8.99 mg/dL 9.01  Calcium  8.9 - 10.3 mg/dL 9.1  Anion gap 5 - 15  10  GFR, Estimated >60 mL/min >60     Thyroid   Ultrasound 05/05/2024   Estimated total number of nodules >/= 1 cm: 3   Number of spongiform nodules >/=  2 cm not described below (TR1): 0   Number of mixed cystic and solid nodules >/= 1.5 cm not described below (TR2): 0   _________________________________________________________   Nodule # 1: Solid isoechoic nodule in the right lower gland measures no more than 1.1 cm. TI-RADS category 3. Given size (<1.4 cm) and appearance, this nodule does NOT meet TI-RADS criteria for biopsy or dedicated follow-up.   Nodule # 2: Hypoechoic solid nodule in the left upper gland remains stable at 1.2 x 0.7 x 0.7 cm. Findings remain consistent with TI-RADS category 4. *Given size (>/= 1 - 1.4 cm) and appearance, a follow-up ultrasound in 1 year should be considered based on TI-RADS criteria.   Nodule # 3: Continued stability of previously biopsied nodule in the left lower gland at 4.2 x 3.3 x 2.6 cm.   IMPRESSION: 1. Continued stability of previously biopsied nodule in the left lower gland dating back to 2022. 2. Stable small TI-RADS category 4 nodule in the left upper gland dating back to May of 2022. This exam marks 3 years of stability. Recommend continued imaging surveillance until 5 years of stability has been documented.    FNA 04/22/2021   Clinical History: Left; Inferior 2.6cm; Other 2 dimensions: 2.4 x 2.0cm,  Solid /almost completely solid, Hypoechoic, TI-RADS total points 4.  Specimen Submitted:  A. 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 Goal A1c < 7.0 %.     -A1c has trended down from 10.5% to 8.8% -She has been on metformin  in the past, I suspect this has been discontinued due to  low GFR at some point  -She has been on Ozempic  but this caused GI issues  -Intolerant to Farxiga  due to severe yeast infection -She has done well with Trulicity , but due to shortage in supply she was switched to Mounjaro  , which was discontinued after developing small bowel obstruction 01/2024 - Patient has been noted with high BG's in the morning, will increase evening glipizide .  Emphasized the importance of eating within 15-20 minutes of taking glipizide    MEDICATIONS: Take glipizide  5 mg, 1 tablet before breakfast and 2 tablets before supper EDUCATION / INSTRUCTIONS: BG monitoring instructions: Patient is instructed to check her blood sugars 1 times a day, fasting. Call [facility] if: BG persistently < 70 I reviewed the Rule of 15 for the treatment of hypoglycemia in detail with the patient. Literature supplied.     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  - Repeat thyroid  ultrasound shows stability   3) CKD III/microalbuminuria:  - Patient on maximum dose of losartan  - Will continue  to monitor  F/U in 6 months   Signed electronically by: Stefano Redgie Butts, MD  [facility]  [facility] [address] [place] [place], [place] [address] Phone: [phone] FAX: [phone]   CC: Katrinka Garnette KIDD, MD [address] [place] [address] Phone: [phone]  Fax: [phone]  Return to Endocrinology clinic as below: Future Appointments  Date Time Provider Department Center  08/01/2024  2:40 PM Paxson Harrower, Donell Redgie, MD [facility] None  09/26/2024  2:20 PM Katrinka Garnette KIDD, MD [facility]

## 2024-08-29 ENCOUNTER — Telehealth: Payer: Self-pay | Admitting: Family Medicine

## 2024-08-29 NOTE — Telephone Encounter (Signed)
 Patient called and is having a lot of pain in her right knee. Patient wanted to see Dr. Joane today but with the soonest appt being next week she is scheduled with Dr. Leonce tomorrow morning. She really wanted to see someone today and I suggested there is a walk- in clinic at [facility]. She mentioned going there or urgent care and I will keep an eye out for a cancellation today for Dr. Joane as well. Patient was informed of this. FYI.

## 2024-08-29 NOTE — Telephone Encounter (Signed)
 Noted

## 2024-08-30 ENCOUNTER — Other Ambulatory Visit: Payer: Self-pay

## 2024-08-30 ENCOUNTER — Ambulatory Visit: Admitting: Sports Medicine

## 2024-08-30 ENCOUNTER — Ambulatory Visit (INDEPENDENT_AMBULATORY_CARE_PROVIDER_SITE_OTHER): Admitting: Family Medicine

## 2024-08-30 VITALS — BP 130/82 | HR 80 | Ht 66.0 in | Wt 255.0 lb

## 2024-08-30 DIAGNOSIS — G8929 Other chronic pain: Secondary | ICD-10-CM

## 2024-08-30 DIAGNOSIS — M25561 Pain in right knee: Secondary | ICD-10-CM | POA: Diagnosis not present

## 2024-08-30 MED ORDER — KETOROLAC TROMETHAMINE 60 MG/2ML IM SOLN
60.0000 mg | Freq: Once | INTRAMUSCULAR | Status: AC
  Administered 2024-08-30: 60 mg via INTRAMUSCULAR

## 2024-08-30 MED ORDER — PREDNISONE 50 MG PO TABS: ORAL_TABLET | ORAL | 0 refills | Status: DC

## 2024-08-30 MED ORDER — METHYLPREDNISOLONE ACETATE 80 MG/ML IJ SUSP
80.0000 mg | Freq: Once | INTRAMUSCULAR | Status: AC
  Administered 2024-08-30: 80 mg via INTRAMUSCULAR

## 2024-08-30 NOTE — Patient Instructions (Addendum)
 Thank you for coming in today.   You received an injection today. Seek immediate medical attention if the joint becomes red, extremely painful, or is oozing fluid.   I've sent a prescription for Prednisone  to your pharmacy.

## 2024-08-30 NOTE — Progress Notes (Unsigned)
   LILLETTE Ileana Collet, PhD, LAT, ATC acting as a scribe for Artist Lloyd, MD.  Lisa [name] is a 63 y.o. female who presents to [facility] today for exacerbation of her R knee pain. Pt was last seen by Dr. Lloyd on 03/06/24 and was prescribed prednisone , hydrocodone , and advised to use Voltaren . Also given a Toradol  and  Depo-Medrol  IM injection. Based on lab findings, her allopurinol  was increased to 2 pills daily.  Today, pt reports she tried to be seen around 11:00 at [facility], but was told she could not be seen until 3:00. R knee pain returned on Monday afternoon. Pain is severe.   Dx testing: 03/07/23 Lab 02/25/21 R knee XR  Pertinent review of systems: No fevers or chills  Relevant historical information: Gout.  PAD.  COPD.  Diabetes.   Exam:  BP 130/82   Pulse 80   Ht 5' 6 (1.676 m)   Wt 255 lb (115.7 kg)   SpO2 98%   BMI 41.16 kg/m  General: Well Developed, well nourished, and in no acute distress.   MSK: Right knee swelling visible anterior knee otherwise normal-appearing Normal knee motion. Tender palpation proximal patellar tendon.  Pain with resisted knee extension.  Intact strength.    Lab and Radiology Results  Diagnostic Limited MSK Ultrasound of: Right knee Minimal joint effusion and intact normal-appearing quadriceps tendon. Patellar tendon is intact but patient does have calcific chronic tendinopathy changes proximal patellar tendon.  This is the area of tenderness to palpation. Mild degeneration medial lateral joint line. Impression: Chronic calcific patellar tendinitis      Assessment and Plan: 63 y.o. female with right knee pain exacerbation due to chronic calcific patellar tendinitis.  This is a very similar problem that she had several months ago.  Last time she did quite well with Toradol  Depo-Medrol  injection followed by short course of oral prednisone .  Additionally we did home exercise program.  Plan  to do that same treatment again now.  Depo-Medrol  and Toradol  shot given today and will start prednisone  for few days tomorrow.  Continue home exercise program.  Check back if needed.   PDMP not reviewed this encounter. Orders Placed This Encounter  Procedures   US  LIMITED JOINT SPACE STRUCTURES LOW RIGHT(NO LINKED CHARGES)    Reason for Exam (SYMPTOM  OR DIAGNOSIS REQUIRED):   right knee pain    Preferred imaging location?:   [facility]   Meds ordered this encounter  Medications   predniSONE  (DELTASONE ) 50 MG tablet    Sig: Take 1 pill daily for 5 days    Dispense:  5 tablet    Refill:  0   ketorolac  (TORADOL ) injection 60 mg   methylPREDNISolone  acetate (DEPO-MEDROL ) injection 80 mg     Discussed warning signs or symptoms. Please see discharge instructions. Patient expresses understanding.   The above documentation has been reviewed and is accurate and complete Artist Lloyd, M.D.

## 2024-09-04 ENCOUNTER — Ambulatory Visit: Admitting: Family Medicine

## 2024-09-06 NOTE — Progress Notes (Deleted)
    Cardiology Office Note Date:  09/06/2024  ID:  Lisa [name], Lisa [name] 03/27/1961, MRN [id number] PCP:  Katrinka Garnette KIDD, MD  Cardiologist: Joelle VEAR Ren Donley, MD  No chief complaint on file.     Problems PAD ABI 2/18: 75-90% in lSFA and 75-99% in rSFA; ABI 12/21 normal S/p angioplasty of mid left SFA CAC on CT MPI 6/20: Normal TTE 4/22: 60-65%, mod-severe asymmetric hypertrophy of basal-septal segment, mod LVH, Grade II DD EM 4/22: 5 days- normal CCTA 1/24: CAC 3 68th percentile, no stenosis Tobacco use- 57 pack year HTN/HLD M: ASA81, CL25, Evolocumab , LN100, Glipizide  L: LDL 40, TG 204, HA1C 8.8  Visits  10/25:    History of Present Illness: Lisa [name] is a 63 y.o. female who presents for follow up.   PHYSICAL EXAM: VS:  There were no vitals taken for this visit. , BMI There is no height or weight on file to calculate BMI. GEN: Well nourished, well developed, in no acute distress HEENT: normal Neck: no JVD, carotid bruits, or masses Cardiac: ***RRR; no murmurs, rubs, or gallops,no edema  Respiratory:  CTAB bilaterally, normal work of breathing GI: soft, nontender, nondistended, + BS Extremities: No LE edema Skin: warm and dry, no rash Neuro:  Strength and sensation are intact  EKG: ***  Recent Labs: 01/13/2024: ALT 11 01/14/2024: Magnesium 2.1 01/16/2024: BUN 11; Creatinine, Ser 0.98; Hemoglobin 14.3; Platelets 211; Potassium 3.3; Sodium 137      Component Value Date/Time   CHOL 108 01/06/2024 0853   CHOL 208 (H) 11/06/2022 0904   TRIG 204.0 (H) 01/06/2024 0853   HDL 27.70 (L) 01/06/2024 0853   HDL 31 (L) 11/06/2022 0904   CHOLHDL 4 01/06/2024 0853   VLDL 40.8 (H) 01/06/2024 0853   LDLCALC 40 01/06/2024 0853   LDLCALC 148 (H) 11/06/2022 0904   LDLDIRECT 144.0 03/24/2022 1502     Wt Readings from Last 5 Encounters:  08/30/24 255 lb (115.7 kg)  06/26/24 255 lb (115.7 kg)  05/01/24 249 lb (112.9 kg)  01/24/24 246 lb 6.4 oz (111.8 kg)   01/18/24 250 lb 6.4 oz (113.6 kg)     BP Readings from Last 5 Encounters:  08/30/24 130/82  05/01/24 120/78  03/06/24 136/82  01/24/24 100/70  01/18/24 128/82    Studies: Reviewed  ASSESSMENT AND PLAN: Lisa [name] is a 63 y.o. female who presents for follow up.  #PAD s/p left SFA angioplasty #Tobacco abuse #CAC on CT #HTN/HLD -   Signed, Joelle VEAR Ren Donley, MD  09/06/2024 10:36 AM    [facility]

## 2024-09-08 ENCOUNTER — Ambulatory Visit

## 2024-09-08 DIAGNOSIS — E1169 Type 2 diabetes mellitus with other specified complication: Secondary | ICD-10-CM

## 2024-09-08 DIAGNOSIS — E1165 Type 2 diabetes mellitus with hyperglycemia: Secondary | ICD-10-CM

## 2024-09-08 DIAGNOSIS — I739 Peripheral vascular disease, unspecified: Secondary | ICD-10-CM

## 2024-09-08 DIAGNOSIS — I2510 Atherosclerotic heart disease of native coronary artery without angina pectoris: Secondary | ICD-10-CM

## 2024-09-09 NOTE — Progress Notes (Signed)
 This encounter was created in error - please disregard.

## 2024-09-26 ENCOUNTER — Encounter: Payer: Self-pay | Admitting: Family Medicine

## 2024-09-26 ENCOUNTER — Ambulatory Visit: Admitting: Family Medicine

## 2024-09-26 VITALS — BP 130/78 | HR 78 | Temp 97.6°F | Ht 66.0 in | Wt 250.0 lb

## 2024-09-26 DIAGNOSIS — F17200 Nicotine dependence, unspecified, uncomplicated: Secondary | ICD-10-CM | POA: Diagnosis not present

## 2024-09-26 DIAGNOSIS — Z0000 Encounter for general adult medical examination without abnormal findings: Secondary | ICD-10-CM | POA: Diagnosis not present

## 2024-09-26 DIAGNOSIS — E785 Hyperlipidemia, unspecified: Secondary | ICD-10-CM

## 2024-09-26 DIAGNOSIS — E1165 Type 2 diabetes mellitus with hyperglycemia: Secondary | ICD-10-CM

## 2024-09-26 DIAGNOSIS — M10472 Other secondary gout, left ankle and foot: Secondary | ICD-10-CM

## 2024-09-26 DIAGNOSIS — Z7984 Long term (current) use of oral hypoglycemic drugs: Secondary | ICD-10-CM

## 2024-09-26 DIAGNOSIS — Z23 Encounter for immunization: Secondary | ICD-10-CM

## 2024-09-26 DIAGNOSIS — R053 Chronic cough: Secondary | ICD-10-CM | POA: Diagnosis not present

## 2024-09-26 DIAGNOSIS — E1169 Type 2 diabetes mellitus with other specified complication: Secondary | ICD-10-CM

## 2024-09-26 MED ORDER — ALBUTEROL SULFATE HFA 108 (90 BASE) MCG/ACT IN AERS: 2.0000 | INHALATION_SPRAY | Freq: Four times a day (QID) | RESPIRATORY_TRACT | 5 refills | Status: AC | PRN

## 2024-09-26 NOTE — Patient Instructions (Signed)
 Team please request pap and mammogram from DrRONITA Mat- she reports both this year as well as bone density  Call cardiology back - need to get Repatha  restarted and have follow up visit  Schedule a lab visit at the check out desk within 2 weeks. Return for future fasting labs meaning nothing but water after midnight please. Ok to take your medications with water.    Recommended follow up: Return in about 4 months (around 01/27/2025) for followup or sooner if needed.Schedule b4 you leave.

## 2024-09-26 NOTE — Progress Notes (Signed)
 Phone [phone]   Subjective:  Patient presents today for their annual physical. Chief complaint-noted.   See problem oriented charting- ROS- full  review of systems was completed and negative except for topics noted under acute/chronic concerns   The following were reviewed and entered/updated in epic: Past Medical History:  Diagnosis Date   Allergic rhinitis    Arthritis    Asthma    Breast nodule    right breast, to see dr mat 06-20-2012 for    Chest pain, non-cardiac    Chronic headaches    Cigarette smoker    Complication of anesthesia 05/17/2012   trouble breathing after colonscopy, needed nebulizer   COPD (chronic obstructive pulmonary disease) (HCC)    Diverticulosis    GERD (gastroesophageal reflux disease)    Hepatic hemangioma    Hypertension    IBS (irritable bowel syndrome)    Nuclear stress test    Myoview  05/2019: EF 63, no ischemia. 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    Priority: High   Morbid obesity (HCC) 02/14/2016    Priority: High   Tobacco use disorder 08/12/2015    Priority: High   COPD (chronic obstructive pulmonary disease) (HCC) 01/18/2015    Priority: High   History of small bowel obstruction 01/24/2024    Priority: Medium    Multinodular goiter 06/10/2022    Priority: Medium    Anxiety 03/24/2022    Priority: Medium    Aortic atherosclerosis 09/10/2021    Priority: Medium    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    Priority:  Medium    CKD stage 3a, GFR 45-59 ml/min (HCC) 09/18/2020    Priority: Medium    Fatty liver 11/06/2019    Priority: Medium    Hyperlipidemia 04/12/2018    Priority: Medium    Hyperlipidemia associated with type 2 diabetes mellitus (HCC) 09/06/2017    Priority: Medium    Breast mass 08/03/2017    Priority: Medium    GERD (gastroesophageal reflux disease) 02/14/2016    Priority: Medium    DOE (dyspnea on exertion) 03/08/2014    Priority: Medium    Migraine without aura 07/26/2013    Priority: Medium    Sleep apnea 02/11/2012    Priority: Medium    Chest discomfort 10/12/2011    Priority: Medium    Essential hypertension 10/12/2011    Priority: Medium    Chronic rhinitis 06/26/2019    Priority: Low   Trigger finger, right middle finger 05/04/2019    Priority: Low   Bilateral leg pain 04/14/2018    Priority: Low   S/P bronchoscopy with biopsy     Priority: Low   Chronic hip pain 07/29/2017    Priority: Low   Left knee pain 05/15/2016    Priority: Low   Flank pain 04/21/2016    Priority: Low   Abdominal pain 02/28/2016    Priority: Low   Pulmonary nodule, right 04/11/2015  Priority: Low   Mediastinal lymphadenopathy 01/18/2015    Priority: Low   Type 2 diabetes mellitus with stage 3a chronic kidney disease, without long-term current use of insulin  (HCC) 06/10/2022   Past Surgical History:  Procedure Laterality Date   ABDOMINAL AORTOGRAM W/LOWER EXTREMITY N/A 04/30/2017   Procedure: Abdominal Aortogram w/Lower Extremity;  Surgeon: Harvey Carlin BRAVO, MD;  Location: [facility];  Service: Cardiovascular;  Laterality: N/A;   CARDIAC CATHETERIZATION  12/28/2007   EF 75%. IT REVEALS NORMAL/SUPRANORMAL LEFT VENTRICULAR SYSTOLIC FUNCTION   CARDIOVASCULAR STRESS TEST  03/25/2007   EF 78%   CESAREAN SECTION  2003, 2009   x 2   CHOLECYSTECTOMY  1993   COLONOSCOPY  2014   ENDOMETRIAL ABLATION  09/2010   and D&C   HERNIA REPAIR  2004   umbilical   LAPAROSCOPY      PERIPHERAL VASCULAR BALLOON ANGIOPLASTY Left 04/30/2017   Procedure: Peripheral Vascular Balloon Angioplasty;  Surgeon: Harvey Carlin BRAVO, MD;  Location: [facility] INVASIVE CV [facility];  Service: Cardiovascular;  Laterality: Left;  SFA   RECONSTRUCTION OF EYELID Bilateral    TUBAL LIGATION     UMBILICAL HERNIA REPAIR  2004   US  ECHOCARDIOGRAPHY  05/08/2008   EF 55-60%   VENTRAL HERNIA REPAIR  06/15/2012   Procedure: LAPAROSCOPIC VENTRAL HERNIA;  Surgeon: Morene ONEIDA Olives, MD;  Location: [facility];  Service: General;  Laterality: N/A;  LAPAROSCOPIC REPAIR VENTRAL HERNIA   VIDEO BRONCHOSCOPY Bilateral 11/15/2017   Procedure: VIDEO BRONCHOSCOPY WITH FLUORO;  Surgeon: Shelah Lamar RAMAN, MD;  Location: [facility];  Service: Cardiopulmonary;  Laterality: Bilateral;    Family History  Problem Relation Age of Onset   Hypertension Mother    Lung cancer Mother        42   Heart disease Father        pacemaker or defibrillator   Breast cancer Maternal Aunt 78   Heart attack Paternal Uncle    Diabetes Maternal Grandmother    Colon cancer Neg Hx    Esophageal cancer Neg Hx    Pancreatic cancer Neg Hx     Medications- reviewed and updated Current Outpatient Medications  Medication Sig Dispense Refill   allopurinol  (ZYLOPRIM ) 100 MG tablet Take 2 tablets (200 mg total) by mouth daily. 180 tablet 1   ASPIRIN  81 PO Take 81 mg by mouth daily.     buPROPion  (WELLBUTRIN  XL) 150 MG 24 hr tablet TAKE 1 TABLET BY MOUTH EVERY DAY 90 tablet 3   carvedilol  (COREG ) 25 MG tablet TAKE 1 TABLET BY MOUTH TWICE A DAY 180 tablet 0   glipiZIDE  (GLUCOTROL ) 5 MG tablet Take 1 tablet (5 mg total) by mouth daily before breakfast AND 2 tablets (10 mg total) daily before supper. 270 tablet 3   ONE TOUCH LANCETS MISC Use to check blood sugars twice a day 200 each 0   pantoprazole  (PROTONIX ) 40 MG tablet TAKE 1 TABLET BY MOUTH 30 MINUTES BEFORE BREAKFAST DAILY 90 tablet 0   telmisartan  (MICARDIS ) 40 MG tablet TAKE 1 TABLET BY MOUTH  EVERY DAY 90 tablet 3   albuterol  (VENTOLIN  HFA) 108 (90 Base) MCG/ACT inhaler Inhale 2 puffs into the lungs every 6 (six) hours as needed for wheezing or shortness of breath (or cough.). 18 g 5   Evolocumab  (REPATHA  SURECLICK) 140 MG/ML SOAJ INJECT 140 MG INTO THE SKIN EVERY 14 (FOURTEEN) DAYS. (Patient not taking: Reported on 09/26/2024) 6 mL 3   lidocaine  (XYLOCAINE ) 2 % solution Use as  directed 15 mLs in the mouth or throat every 3 (three) hours as needed for mouth pain (gargle; may spit or swallow). (Patient not taking: Reported on 09/26/2024) 100 mL 0   No current facility-administered medications for this visit.    Allergies-reviewed and updated Allergies  Allergen Reactions   Almond Oil Anaphylaxis and Nausea And Vomiting    Almond-Nuts Only   Apple Juice Anaphylaxis   Ceftin  [Cefuroxime ] Other (See Comments)    Severe stomach pain - Diverticulitis flares up   Cherry Extract Anaphylaxis    Cherry fruit only    Other Anaphylaxis and Itching    Most fruits- Are NOT tolerated (PLEASE ASK PATIENT BEFORE GIVING, AS CERTAIN FRUITS ARE TOLERATED IN LIMITED QUANTITIES!!)   Guaifenesin  & Derivatives Other (See Comments)    Restlessness, jittery   Hctz [Hydrochlorothiazide] Other (See Comments)    Causes a headache   Naproxen Other (See Comments)    Insomnia   Walnut Itching    Throat itching   Lisinopril  Cough         Social History   Social History Narrative   Married 1997. 2 sons 3 and 44 years old in 10/18. Wants to see children grow up.       Customer Service/accounts payable.    Went to school for medical assisting- has had a hard time finding work      Hobbies: [employer], enjoying [place], getting out of home, wants to take [employer]   Objective  Objective:  BP 130/78 (BP Location: Left Arm, Patient Position: Sitting, Cuff Size: Normal)   Pulse 78   Temp 97.6 F (36.4 C) (Temporal)   Ht 5' 6 (1.676 m)   Wt 250 lb (113.4 kg)   SpO2 95%   BMI 40.35 kg/m  Gen:  NAD, resting comfortably Mild erythema of sclera and conjunctiva only on right without drainage HEENT: Mucous membranes are moist. Oropharynx normal Neck: no thyromegaly CV: RRR no murmurs rubs or gallops Lungs: CTAB no crackles, wheeze, rhonchi Abdomen: soft/nontender/nondistended/normal bowel sounds. No rebound or guarding.  Ext: trace edema Skin: warm, dry Neuro: grossly normal, moves all extremities, PERRLA   Assessment and Plan   63 y.o. female presenting for annual physical.  Health Maintenance counseling: 1. Anticipatory guidance: Patient counseled regarding regular dental exams - advisedq6 months- still not going, eye exams - yearly eye exams,  avoiding smoking and second hand smoke- see below , limiting alcohol to 1 beverage per day- no alcohol , no illicit drugs .   2. Risk factor reduction:  Advised patient of need for regular exercise and diet rich and fruits and vegetables to reduce risk of heart attack and stroke.  Exercise- walking at work around building 15 minutes daily. Not walking dog right now- males in family do this. Encouraged to push for 150 minutes  Diet/weight management-down 3 lbs from last physical- morbid obesity noted with BMI over 40.  Wt Readings from Last 3 Encounters:  09/26/24 250 lb (113.4 kg)  08/30/24 255 lb (115.7 kg)  06/26/24 255 lb (115.7 kg)  3. Immunizations/screenings/ancillary studies--plans to schedule flu and Prevnar shot later this week-has a procedure tomorrow and wants to hold off.  Declines Shingrix .  She is holding off on COVID shots long-term Immunization History  Administered Date(s) Administered   Influenza Split 12/31/2013, 08/30/2014   Influenza, Seasonal, Injecte, Preservative Fre 09/07/2023   Influenza,inj,Quad PF,6+ Mos 08/12/2015, 08/21/2016, 09/06/2017, 09/14/2018, 09/01/2019, 09/10/2020, 09/16/2021   PFIZER(Purple Top)SARS-COV-2 Vaccination 02/22/2020, 03/18/2020, 10/07/2020  PPD Test 03/04/2012   Pneumococcal  Polysaccharide-23 06/16/2012   Tdap 03/04/2012, 02/15/2023   Zoster Recombinant(Shingrix ) 03/24/2022   4. Cervical cancer screening- pap smear 09/26/21 with human papilloma virus negative and has seen Dr. Mat-  5. Breast cancer screening-  breast exam with Gyn and mammogram - with gynecology - will request 6. Colon cancer screening - 06/29/24 with 3 year repeat she believes with [facility] 7. Skin cancer screening- lower risk due to melanin content. advised regular sunscreen use. Denies worrisome, changing, or new skin lesions.  8. Birth control/STD check- monogamous and postmenopausal 9. Osteoporosis screening at 57- had with Dr. Mat 10. Smoking associated screening - current smoker under or at pack a day - encouraged full cessation. Had CT lung cancer screening in July- did nto cross over to epic- team to call- report in [facility] system  Status of chronic or acute concerns   # Orthopedic issues-works with Dr. Joane of sports medicine   # mild redness in right eye- also itches- may try patady- did have some yellow discharge though and if that worsens or she has eye pain or blurry vision will seek care immediatley  # COPD/current smoker-still smoking- advised cessation  S: Medication: Albuterol  as needed  A/P: doing well lately- continue to monitor   -Strongly encourage smoking cessation again today   # PAD/coronary calcifications #hyperlipidemia S: Medication:Nexlizet  reaction.  Statin intolerant.  Repatha  140 mg every 14 days through cardiology prescription but is not taking- has not received -No claudication related to peripheral arterial disease  -No chest pain in regards to coronary artery calcifications  Lab Results  Component Value Date   CHOL 108 01/06/2024   HDL 27.70 (L) 01/06/2024   LDLCALC 40 01/06/2024   LDLDIRECT 144.0 03/24/2022   TRIG 204.0 (H) 01/06/2024   CHOLHDL 4 01/06/2024   A/P: peripheral arterial disease and coronary artery calcifications asymptomatic - continue  current medications but needs to restart Repatha  for lipids- continue current medications   # Diabetes-works with Dr. Sam S: Medication:Glipizide  5 mg in the morning and 10 mg in the evening -She has been on Ozempic  but this caused GI issues  -Intolerant to Farxiga  due to severe yeast infection -She has done well with Trulicity , but due to shortage in supply she was switched to Mounjaro  , which was discontinued after developing small bowel obstruction 01/2024 Lab Results  Component Value Date   HGBA1C 8.8 (A) 05/01/2024   HGBA1C 10.5 (H) 01/06/2024   HGBA1C 9.6 (H) 09/07/2023  A/P: check a1c and forward results to endocrine- dental infection have slight protein leakage on most recent test -uacr elevation- may need telmisartan  80mg   #hypertension # CKD stage III  S: medication: Telmisartan  40 mg A/P: blood pressure well controlled continue current medications  Chronic kidney disease III hopefully stable and need to address UACR elevation    #Gout S: Medication: Allopurinol  200 mg -no recent flares A/P:update uric acid- hoping better   # Fatty liver-work on weight loss- recheck this   # Multinodular goiter-follows with Dr. Sam but was told may be released for this issue   # anxiety/suppress smoking S: Medication:Wellbutrin  150 mg extended release A/P: reasonable control for anxiety but not helping as much as we would liek for smoking- continue to monitor - continue current medications   # Endoscopic ultrasound planned through [facility] - has this planned tomorrow  Recommended follow up: Return in about 4 months (around 01/27/2025) for followup or sooner if needed.Schedule b4 you leave.  Lab/Order associations:NOT  fasting   ICD-10-CM   1. Preventative health care  Z00.00     2. Immunization due  Z23 CANCELED: Flu vaccine trivalent PF, 6mos and older(Flulaval,Afluria,Fluarix,Fluzone)    CANCELED: Pneumococcal conjugate vaccine 20-valent (Prevnar 20)    3. Persistent  cough  R05.3 albuterol  (VENTOLIN  HFA) 108 (90 Base) MCG/ACT inhaler    4. Type 2 diabetes mellitus with hyperglycemia, without long-term current use of insulin  (HCC)  E11.65 Comprehensive metabolic panel with GFR    CBC with Differential/Platelet    Lipid panel    Hemoglobin A1c    Microalbumin / creatinine urine ratio    5. Morbid obesity (HCC)  E66.01     6. Tobacco use disorder  F17.200     7. Hyperlipidemia associated with type 2 diabetes mellitus (HCC)  E11.69 Lipid panel   E78.5 TSH    8. Other secondary acute gout of left foot  M10.472 Uric acid      Meds ordered this encounter  Medications   albuterol  (VENTOLIN  HFA) 108 (90 Base) MCG/ACT inhaler    Sig: Inhale 2 puffs into the lungs every 6 (six) hours as needed for wheezing or shortness of breath (or cough.).    Dispense:  18 g    Refill:  5    Return precautions advised.  Garnette Lukes, MD

## 2024-09-28 ENCOUNTER — Other Ambulatory Visit (HOSPITAL_BASED_OUTPATIENT_CLINIC_OR_DEPARTMENT_OTHER): Payer: Self-pay

## 2024-10-30 ENCOUNTER — Emergency Department (HOSPITAL_BASED_OUTPATIENT_CLINIC_OR_DEPARTMENT_OTHER)

## 2024-10-30 ENCOUNTER — Emergency Department (HOSPITAL_BASED_OUTPATIENT_CLINIC_OR_DEPARTMENT_OTHER)
Admission: EM | Admit: 2024-10-30 | Discharge: 2024-10-30 | Disposition: A | Discharge status: Home / Self Care | Attending: Emergency Medicine | Admitting: Emergency Medicine

## 2024-10-30 ENCOUNTER — Other Ambulatory Visit: Payer: Self-pay

## 2024-10-30 ENCOUNTER — Emergency Department (HOSPITAL_BASED_OUTPATIENT_CLINIC_OR_DEPARTMENT_OTHER): Admitting: Radiology

## 2024-10-30 ENCOUNTER — Encounter (HOSPITAL_BASED_OUTPATIENT_CLINIC_OR_DEPARTMENT_OTHER): Payer: Self-pay

## 2024-10-30 ENCOUNTER — Emergency Department (HOSPITAL_COMMUNITY)

## 2024-10-30 DIAGNOSIS — E1122 Type 2 diabetes mellitus with diabetic chronic kidney disease: Secondary | ICD-10-CM | POA: Diagnosis not present

## 2024-10-30 DIAGNOSIS — Z7982 Long term (current) use of aspirin: Secondary | ICD-10-CM | POA: Diagnosis not present

## 2024-10-30 DIAGNOSIS — J111 Influenza due to unidentified influenza virus with other respiratory manifestations: Secondary | ICD-10-CM | POA: Diagnosis not present

## 2024-10-30 DIAGNOSIS — R109 Unspecified abdominal pain: Secondary | ICD-10-CM | POA: Diagnosis not present

## 2024-10-30 DIAGNOSIS — I129 Hypertensive chronic kidney disease with stage 1 through stage 4 chronic kidney disease, or unspecified chronic kidney disease: Secondary | ICD-10-CM | POA: Diagnosis not present

## 2024-10-30 DIAGNOSIS — Z79899 Other long term (current) drug therapy: Secondary | ICD-10-CM | POA: Diagnosis not present

## 2024-10-30 DIAGNOSIS — Z720 Tobacco use: Secondary | ICD-10-CM | POA: Diagnosis not present

## 2024-10-30 DIAGNOSIS — R519 Headache, unspecified: Secondary | ICD-10-CM

## 2024-10-30 DIAGNOSIS — R059 Cough, unspecified: Secondary | ICD-10-CM | POA: Diagnosis present

## 2024-10-30 DIAGNOSIS — N189 Chronic kidney disease, unspecified: Secondary | ICD-10-CM | POA: Insufficient documentation

## 2024-10-30 DIAGNOSIS — R930 Abnormal findings on diagnostic imaging of skull and head, not elsewhere classified: Secondary | ICD-10-CM | POA: Insufficient documentation

## 2024-10-30 DIAGNOSIS — Q282 Arteriovenous malformation of cerebral vessels: Secondary | ICD-10-CM

## 2024-10-30 DIAGNOSIS — J449 Chronic obstructive pulmonary disease, unspecified: Secondary | ICD-10-CM | POA: Insufficient documentation

## 2024-10-30 DIAGNOSIS — Z7984 Long term (current) use of oral hypoglycemic drugs: Secondary | ICD-10-CM | POA: Diagnosis not present

## 2024-10-30 LAB — I-STAT VENOUS BLOOD GAS, ED
Acid-base deficit: 1 mmol/L (ref 0.0–2.0)
Bicarbonate: 23.5 mmol/L (ref 20.0–28.0)
Calcium, Ion: 1.26 mmol/L (ref 1.15–1.40)
HCT: 46 % (ref 36.0–46.0)
Hemoglobin: 15.6 g/dL — ABNORMAL HIGH (ref 12.0–15.0)
O2 Saturation: 88 %
Potassium: 3.6 mmol/L (ref 3.5–5.1)
Sodium: 135 mmol/L (ref 135–145)
TCO2: 25 mmol/L (ref 22–32)
pCO2, Ven: 39.8 mmHg — ABNORMAL LOW (ref 44–60)
pH, Ven: 7.38 (ref 7.25–7.43)
pO2, Ven: 55 mmHg — ABNORMAL HIGH (ref 32–45)

## 2024-10-30 LAB — URINALYSIS, W/ REFLEX TO CULTURE (INFECTION SUSPECTED)
Bacteria, UA: NONE SEEN
Bilirubin Urine: NEGATIVE
Glucose, UA: >1000 mg/dL — AB
Ketones, ur: NEGATIVE mg/dL
Leukocytes,Ua: NEGATIVE
Nitrite: NEGATIVE
Protein, ur: 30 mg/dL — AB
Specific Gravity, Urine: 1.044 — ABNORMAL HIGH (ref 1.005–1.030)
pH: 5.5 (ref 5.0–8.0)

## 2024-10-30 LAB — CBC WITH DIFFERENTIAL/PLATELET
Abs Immature Granulocytes: 0.03 K/uL (ref 0.00–0.07)
Basophils Absolute: 0 K/uL (ref 0.0–0.1)
Basophils Relative: 0 %
Eosinophils Absolute: 0 K/uL (ref 0.0–0.5)
Eosinophils Relative: 0 %
HCT: 44.3 % (ref 36.0–46.0)
Hemoglobin: 15.2 g/dL — ABNORMAL HIGH (ref 12.0–15.0)
Immature Granulocytes: 1 %
Lymphocytes Relative: 11 %
Lymphs Abs: 0.7 K/uL (ref 0.7–4.0)
MCH: 30.2 pg (ref 26.0–34.0)
MCHC: 34.3 g/dL (ref 30.0–36.0)
MCV: 88.1 fL (ref 80.0–100.0)
Monocytes Absolute: 0.6 K/uL (ref 0.1–1.0)
Monocytes Relative: 10 %
Neutro Abs: 4.7 K/uL (ref 1.7–7.7)
Neutrophils Relative %: 78 %
Platelets: 199 K/uL (ref 150–400)
RBC: 5.03 MIL/uL (ref 3.87–5.11)
RDW: 12.7 % (ref 11.5–15.5)
WBC: 6 K/uL (ref 4.0–10.5)
nRBC: 0 % (ref 0.0–0.2)

## 2024-10-30 LAB — COMPREHENSIVE METABOLIC PANEL WITH GFR
ALT: 19 U/L (ref 0–44)
AST: 16 U/L (ref 15–41)
Albumin: 3.9 g/dL (ref 3.5–5.0)
Alkaline Phosphatase: 107 U/L (ref 38–126)
Anion gap: 13 (ref 5–15)
BUN: 17 mg/dL (ref 8–23)
CO2: 24 mmol/L (ref 22–32)
Calcium: 10.2 mg/dL (ref 8.9–10.3)
Chloride: 98 mmol/L (ref 98–111)
Creatinine, Ser: 1.05 mg/dL — ABNORMAL HIGH (ref 0.44–1.00)
GFR, Estimated: >60 mL/min (ref >60)
Glucose, Bld: 365 mg/dL — ABNORMAL HIGH (ref 70–99)
Potassium: 3.8 mmol/L (ref 3.5–5.1)
Sodium: 135 mmol/L (ref 135–145)
Total Bilirubin: 0.6 mg/dL (ref 0.0–1.2)
Total Protein: 7.6 g/dL (ref 6.5–8.1)

## 2024-10-30 LAB — RESP PANEL BY RT-PCR (RSV, FLU A&B, COVID)  RVPGX2
Influenza A by PCR: POSITIVE — AB
Influenza B by PCR: NEGATIVE
Resp Syncytial Virus by PCR: NEGATIVE
SARS Coronavirus 2 by RT PCR: NEGATIVE

## 2024-10-30 LAB — TROPONIN T, HIGH SENSITIVITY
Troponin T High Sensitivity: <15 ng/L (ref 0–19)
Troponin T High Sensitivity: <15 ng/L (ref 0–19)

## 2024-10-30 LAB — LIPASE, BLOOD: Lipase: 28 U/L (ref 11–51)

## 2024-10-30 LAB — GROUP A STREP BY PCR: Group A Strep by PCR: NOT DETECTED

## 2024-10-30 LAB — PRO BRAIN NATRIURETIC PEPTIDE: Pro Brain Natriuretic Peptide: 71 pg/mL (ref <300.0)

## 2024-10-30 LAB — D-DIMER, QUANTITATIVE: D-Dimer, Quant: 0.44 ug{FEU}/mL (ref 0.00–0.50)

## 2024-10-30 MED ORDER — IPRATROPIUM-ALBUTEROL 0.5-2.5 (3) MG/3ML IN SOLN
3.0000 mL | Freq: Once | RESPIRATORY_TRACT | Status: AC
  Administered 2024-10-30: 3 mL via RESPIRATORY_TRACT
  Filled 2024-10-30: qty 3

## 2024-10-30 MED ORDER — MORPHINE SULFATE (PF) 4 MG/ML IV SOLN
4.0000 mg | Freq: Once | INTRAVENOUS | Status: AC
  Administered 2024-10-30: 4 mg via INTRAVENOUS
  Filled 2024-10-30: qty 1

## 2024-10-30 MED ORDER — ASPIRIN 81 MG PO CHEW
324.0000 mg | CHEWABLE_TABLET | Freq: Once | ORAL | Status: DC
  Filled 2024-10-30: qty 4

## 2024-10-30 MED ORDER — IOHEXOL 350 MG/ML SOLN
100.0000 mL | Freq: Once | INTRAVENOUS | Status: AC | PRN
  Administered 2024-10-30: 100 mL via INTRAVENOUS

## 2024-10-30 MED ORDER — DEXTROMETHORPHAN POLISTIREX ER 30 MG/5ML PO SUER
60.0000 mg | Freq: Once | ORAL | Status: AC
  Administered 2024-10-30: 60 mg via ORAL
  Filled 2024-10-30: qty 10

## 2024-10-30 MED ORDER — ALBUTEROL SULFATE HFA 108 (90 BASE) MCG/ACT IN AERS
INHALATION_SPRAY | RESPIRATORY_TRACT | Status: AC
  Filled 2024-10-30: qty 6.7

## 2024-10-30 MED ORDER — GADOBUTROL 1 MMOL/ML IV SOLN
10.0000 mL | Freq: Once | INTRAVENOUS | Status: AC | PRN
  Administered 2024-10-30: 10 mL via INTRAVENOUS

## 2024-10-30 MED ORDER — ALBUTEROL SULFATE HFA 108 (90 BASE) MCG/ACT IN AERS
2.0000 | INHALATION_SPRAY | Freq: Once | RESPIRATORY_TRACT | Status: AC
  Administered 2024-10-30: 2 via RESPIRATORY_TRACT

## 2024-10-30 MED ORDER — ASPIRIN 81 MG PO CHEW
243.0000 mg | CHEWABLE_TABLET | Freq: Once | ORAL | Status: AC
  Administered 2024-10-30: 243 mg via ORAL

## 2024-10-30 MED ORDER — OSELTAMIVIR PHOSPHATE 75 MG PO CAPS
75.0000 mg | ORAL_CAPSULE | Freq: Once | ORAL | Status: AC
  Administered 2024-10-30: 75 mg via ORAL
  Filled 2024-10-30: qty 1

## 2024-10-30 NOTE — ED Provider Notes (Signed)
  Physical Exam  BP (!) 154/78 (BP Location: Right Arm)   Pulse 95   Temp 99 F (37.2 C) (Oral)   Resp 20   SpO2 95%   Physical Exam Vitals and nursing note reviewed.  Constitutional:      General: She is not in acute distress.    Appearance: She is well-developed.  HENT:     Head: Normocephalic and atraumatic.  Eyes:     Conjunctiva/sclera: Conjunctivae normal.  Cardiovascular:     Rate and Rhythm: Normal rate and regular rhythm.     Heart sounds: No murmur heard. Pulmonary:     Effort: Pulmonary effort is normal. No respiratory distress.     Breath sounds: Normal breath sounds.  Abdominal:     Palpations: Abdomen is soft.     Tenderness: There is no abdominal tenderness.  Musculoskeletal:        General: No swelling.     Cervical back: Neck supple.  Skin:    General: Skin is warm and dry.     Capillary Refill: Capillary refill takes less than 2 seconds.  Neurological:     Mental Status: She is alert.  Psychiatric:        Mood and Affect: Mood normal.     Procedures  Procedures  ED Course / MDM   Clinical Course as of 10/31/24 0013  Mon Oct 30, 2024  1538 S, sent for MRI, pending. + flu, still pending. Abnormal findings on head CT. Once back, likely plan for d/c.  []  MRI []  d/c [BS]  1906 There is an appreciation of an AVM on the imaging, with possible blushing that was appreciated on the CTA.  Overall while patient is asymptomatic at this time, patient would benefit from neurology versus neurosurgery evaluation.  We will reach out to neurology at this time, and determine appropriate follow-up at this time. [BS]  2057 F/u 1-2 weeks w/ neurosurgery arteriogram (Dr. Josph) concerning possible AVM.  [BS]    Clinical Course User Index [BS] Arlee Katz, MD   Medical Decision Making Amount and/or Complexity of Data Reviewed Labs: ordered. Radiology: ordered.  Risk OTC drugs. Prescription drug management.   Care assumed pending MRI.  MRI with  appreciation primarily venous BPM, therefore consulted neurology.  Recommended consulting neurosurgery with possible evidence of washing on imaging, however noted that there is no active bleed or edema appreciated on MRI.  Neurosurgery with low suspicion for active bleeding at this time.  Patient with repeat evaluation, denies headache, and endorses no significant proved.  Endorses that chest pain and abdominal pain have also improved.  Patient with continued cough, very likely in the setting of influenza positive.  No evidence of pneumonia imaging.  With improvement in headache, low suspicion for active bleed, and with close follow-up with neurosurgery in 1 week to undergo outpatient arteriogram, patient is overall stable for discharge.  Did evaluate patient walking in the ED without difficulty, no evidence of lightheadedness, overall stable for discharge.  Discussed strict return precautions.       Arlee Katz, MD 10/31/24 VALERY    Tonia Chew, MD 11/04/24 [phone]

## 2024-10-30 NOTE — ED Triage Notes (Signed)
 Patient bib [facility] as trasnfer from [facility]. She's here for abnormal ct of head and flu+.

## 2024-10-30 NOTE — Discharge Instructions (Addendum)
 Please follow-up with Dr. Lanis to undergo arteriogram.  You will receive a call to schedule an appointment in 1 to 2 weeks.  I do recommend taking Tylenol  if you develop any further headaches.

## 2024-10-30 NOTE — ED Provider Notes (Signed)
 [place] EMERGENCY DEPARTMENT AT [facility] Provider Note   CSN: [id number] Arrival date & time: 10/30/24  0818     History Chief Complaint  Patient presents with   Cough     Cough  HPI: Lisa [name] is a 63 y.o. female with history perinent for COPD/asthma not on home oxygen, HTN, sleep apnea, tobacco use disorder, GERD, elevated BMI, PAD, HLD, CKD, T2DM who presents complaining of multiple complaints. Patient arrived via POV.  History provided by patient.  No interpreter required during this encounter.  Patient presents complaining of multiple different concerns.  Reports that she had chest pain that with onset of today, reports that it is a burning substernal chest pressure, partially similar to her previous GERD.  Also reports that she has developed some diffuse abdominal pain this morning, and believes that her diverticulitis is starting to flareup.  Also reports that she has had 2 days of multiple symptoms including nonproductive cough, sore throat, chest wall pain with cough, and headache.  Denies photophobia, neck stiffness, however reports that this is the most severe headache that she has had, and has not previously had a similar headache.  Patient's recorded medical, surgical, social, medication list and allergies were reviewed in the Snapshot window as part of the initial history.   Prior to Admission medications   Medication Sig Start Date End Date Taking? Authorizing Provider  albuterol  (VENTOLIN  HFA) 108 (90 Base) MCG/ACT inhaler Inhale 2 puffs into the lungs every 6 (six) hours as needed for wheezing or shortness of breath (or cough.). 09/26/24   Katrinka Garnette KIDD, MD  allopurinol  (ZYLOPRIM ) 100 MG tablet Take 2 tablets (200 mg total) by mouth daily. 03/08/24   Joane Artist RAMAN, MD  ASPIRIN  81 PO Take 81 mg by mouth daily.    [provider]  buPROPion  (WELLBUTRIN  XL) 150 MG 24 hr tablet TAKE 1 TABLET BY MOUTH EVERY DAY 08/18/23   Katrinka Garnette KIDD, MD   carvedilol  (COREG ) 25 MG tablet TAKE 1 TABLET BY MOUTH TWICE A DAY 02/21/24   Nahser, Aleene PARAS, MD  Evolocumab  (REPATHA  SURECLICK) 140 MG/ML SOAJ INJECT 140 MG INTO THE SKIN EVERY 14 (FOURTEEN) DAYS. Patient not taking: Reported on 09/26/2024 11/29/23   Nahser, Aleene PARAS, MD  glipiZIDE  (GLUCOTROL ) 5 MG tablet Take 1 tablet (5 mg total) by mouth daily before breakfast AND 2 tablets (10 mg total) daily before supper. 05/01/24   Shamleffer, Donell Cardinal, MD  lidocaine  (XYLOCAINE ) 2 % solution Use as directed 15 mLs in the mouth or throat every 3 (three) hours as needed for mouth pain (gargle; may spit or swallow). Patient not taking: Reported on 09/26/2024 12/08/23   Lucius Krabbe, NP  ONE TOUCH LANCETS MISC Use to check blood sugars twice a day 12/20/17   Elvie French HERO, PA-C  pantoprazole  (PROTONIX ) 40 MG tablet TAKE 1 TABLET BY MOUTH 30 MINUTES BEFORE BREAKFAST DAILY 10/04/23   Aneita Gwendlyn ONEIDA, MD  telmisartan  (MICARDIS ) 40 MG tablet TAKE 1 TABLET BY MOUTH EVERY DAY 01/12/24   Katrinka Garnette KIDD, MD     Allergies: Almond oil, Apple juice, Ceftin  [cefuroxime ], Cherry extract, Other, Guaifenesin  & derivatives, Hctz [hydrochlorothiazide], Naproxen, Walnut, and Lisinopril    Review of Systems   ROS as per HPI  Physical Exam Updated Vital Signs BP (!) 168/85   Pulse 89   Temp 98.7 F (37.1 C) (Oral)   Resp (!) 27   SpO2 90%  Physical Exam Vitals and nursing note reviewed.  Constitutional:  General: She is not in acute distress.    Appearance: Normal appearance. She is well-developed. She is obese.     Comments: Uncomfortable appearing  HENT:     Head: Normocephalic and atraumatic.  Eyes:     Extraocular Movements: Extraocular movements intact.     Conjunctiva/sclera: Conjunctivae normal.     Pupils: Pupils are equal, round, and reactive to light.     Comments: No photophobia  Neck:     Meningeal: Brudzinski's sign and Kernig's sign absent.  Cardiovascular:     Rate and Rhythm:  Normal rate and regular rhythm.     Pulses: Normal pulses.     Heart sounds: No murmur heard. Pulmonary:     Effort: Pulmonary effort is normal. No respiratory distress.     Breath sounds: Wheezing (Scattered) present.  Abdominal:     Palpations: Abdomen is soft.     Tenderness: There is abdominal tenderness (Mild, diffuse).  Musculoskeletal:        General: No swelling.     Cervical back: Neck supple.  Skin:    General: Skin is warm and dry.     Capillary Refill: Capillary refill takes less than 2 seconds.  Neurological:     General: No focal deficit present.     Mental Status: She is alert and oriented to person, place, and time.  Psychiatric:        Mood and Affect: Mood normal.     ED Course/ Medical Decision Making/ A&P Clinical Course as of 10/30/24 1622  Mon Oct 30, 2024  1538 S, sent for MRI, pending. + flu, still pending. Abnormal findings on head CT. Once back, likely plan for d/c.  []  MRI []  d/c [BS]    Clinical Course User Index [BS] Arlee Katz, MD    Procedures Procedures   Medications Ordered in ED Medications  morphine  (PF) 4 MG/ML injection 4 mg (has no administration in time range)  dextromethorphan  (DELSYM ) 30 MG/5ML liquid 60 mg (has no administration in time range)  oseltamivir  (TAMIFLU ) capsule 75 mg (has no administration in time range)  albuterol  (VENTOLIN  HFA) 108 (90 Base) MCG/ACT inhaler 2 puff (2 puffs Inhalation Given 10/30/24 0839)  ipratropium-albuterol  (DUONEB) 0.5-2.5 (3) MG/3ML nebulizer solution 3 mL (3 mLs Nebulization Given 10/30/24 9071)  aspirin  chewable tablet 243 mg (243 mg Oral Given 10/30/24 0946)  iohexol  (OMNIPAQUE ) 350 MG/ML injection 100 mL (100 mLs Intravenous Contrast Given 10/30/24 1021)    Medical Decision Making:   Lisa [name] is a 63 y.o. female who presents for multiple symptoms as per above.  Physical exam is pertinent for scattered wheezes without increased work of breathing, diffuse abdominal tenderness  to palpation.   The differential includes but is not limited to viral syndrome, meningitis, encephalitis, viral pharyngitis, strep pharyngitis, diverticulitis, pancreatitis, cystitis, nephrolithiasis, pyelonephritis, ACS, arrhythmia, pericardial tamponade, pericarditis, myocarditis, pneumonia, pneumothorax, esophageal, tear, perforated abdominal viscous, pulmonary embolism, aortic dissection, costochondritis, musculoskeletal chest wall pain, GERD.  Independent historian: None  External data reviewed: No pertinent external data  Labs: Ordered, Independent interpretation, and Details: UA without UTI.  CBC without leukocytosis, anemia, thrombocytopenia.  CMP without AKI, emergent electrolyte derangement, emergent LFT abnormality.  D-dimer WNL.  Group A strep negative.  Initial and delta troponin undetectable.  VBG without acidosis or hypercarbia.  BNP WNL.  RVP positive.  Radiology: Ordered, Independent interpretation, Details: Chest x-ray without focal airspace opacification, cardiomediastinal silhouette derangement, pneumothorax, pleural effusion, bony derangement.  CT head with slight hyperdensity within the right  frontal lobe concerning for ICH.  CTA with and without contrast does redemonstrate increased contrast burden within the same area.  CT of the abdomen and pelvis without focal fat stranding, obstructive bowel gas pattern, free air, free fluid, and All images reviewed independently.  Agree with radiology report at this time.   CT ABDOMEN PELVIS W CONTRAST Result Date: 10/30/2024 EXAM: CT ABDOMEN AND PELVIS WITH CONTRAST 10/30/2024 10:31:50 AM TECHNIQUE: CT of the abdomen and pelvis was performed with the administration of 100 mL of iohexol  (OMNIPAQUE ) 350 MG/ML injection. Multiplanar reformatted images are provided for review. Automated exposure control, iterative reconstruction, and/or weight-based adjustment of the mA/kV was utilized to reduce the radiation dose to as low as reasonably  achievable. COMPARISON: CT of the abdomen and pelvis dated 01/13/2024. CLINICAL HISTORY: Abdominal pain, acute, nonlocalized. FINDINGS: LOWER CHEST: There are a few circumscribed ovoid nodules again demonstrated inferiorly within the right breast, with the largest measuring approximately 18 x 10 x 17 mm. There is a mildly prominent right infrahilar lymph node, measuring 14 x 13 mm, which is seen on image 4 of series 6. LIVER: There is diffuse fatty infiltration of the liver. The liver is also enlarged, measuring approximately 20 cm in length in the midclavicular line. An ovoid hypodense lesion is again noted within the dome of the right hepatic lobe, which remains compatible with a hemangioma. GALLBLADDER AND BILE DUCTS: The patient is status post cholecystectomy. No biliary ductal dilatation. SPLEEN: No acute abnormality. PANCREAS: No acute abnormality. ADRENAL GLANDS: No acute abnormality. KIDNEYS, URETERS AND BLADDER: There is a small simple cyst arising anteriorly from the lower pole of the left kidney. Per consensus, no follow-up is needed for simple Bosniak type 1 and 2 renal cysts, unless the patient has a malignancy history or risk factors. No stones in the kidneys or ureters. No hydronephrosis. No perinephric or periureteral stranding. Urinary bladder is unremarkable. GI AND BOWEL: Stomach demonstrates no acute abnormality. There are numerous colonic diverticula, but no definite evidence of diverticulitis. There is a mobile cecum. The appendix and small bowel are unremarkable. There is no bowel obstruction. PERITONEUM AND RETROPERITONEUM: No ascites. No free air. VASCULATURE: The abdominal aorta is normal in caliber and demonstrates mild calcific atheromatous disease. LYMPH NODES: No lymphadenopathy. REPRODUCTIVE ORGANS: No acute abnormality. BONES AND SOFT TISSUES: The patient is status post abdominal hernia repair. No acute osseous abnormality. No focal soft tissue abnormality. IMPRESSION: 1. No acute  findings in the abdomen or pelvis. 2. Enlarged liver with diffuse hepatic steatosis and a stable ovoid hypodense lesion in the right hepatic lobe, compatible with a hemangioma. 3. Numerous colonic diverticula without evidence of diverticulitis. Electronically signed by: Evalene Coho MD 10/30/2024 11:32 AM EST RP Workstation: [id number]   CT Angio Head W or Wo Contrast Result Date: 10/30/2024 EXAM: CTA Head without and with Intravenous Contrast CLINICAL HISTORY: Headache, sudden, severe. TECHNIQUE: Axial CTA images of the head without and with intravenous contrast. MIP reconstructed images were created and reviewed. Dose reduction technique was used including one or more of the following: automated exposure control, adjustment of mA and kV according to patient size, and/or iterative reconstruction. CONTRAST: Without and with; 100 mL (iohexol  (OMNIPAQUE ) 350 MG/ML injection 100 mL IOHEXOL  350 MG/ML SOLN). COMPARISON: CT of the head dated 10/30/2024. 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 The ventricles remained moderately distended in proportion to the cerebral sulci. INTERNAL CAROTID ARTERIES: The intracranial ICAs are patent with no significant stenosis. No occlusion. No aneurysm. ANTERIOR CEREBRAL ARTERIES: No significant stenosis. No occlusion. No aneurysm. MIDDLE CEREBRAL ARTERIES: No significant stenosis. No occlusion. No aneurysm. POSTERIOR CEREBRAL ARTERIES: No significant stenosis. No occlusion. No aneurysm. BASILAR ARTERY: No significant stenosis. No occlusion. No aneurysm. VERTEBRAL ARTERIES: No significant stenosis. No occlusion. No aneurysm. SOFT TISSUES: No acute finding. No masses or  lymphadenopathy. BONES: No acute osseous abnormality. IMPRESSION: 1. No evidence of aneurysm. 2. 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 MRI of the brain without and with gadolinium contrast continues to be recommended. 3. Moderately distended ventricles in proportion to the cerebral sulci. Electronically signed by: Evalene Coho MD 10/30/2024 11:10 AM EST RP Workstation: [id number]   CT Head Wo Contrast Result Date: 10/30/2024 EXAM: CT HEAD WITHOUT 10/30/2024 09:13:09 AM TECHNIQUE: CT of the head was performed without the administration of intravenous contrast. Automated exposure control, iterative reconstruction, and/or weight based adjustment of the mA/kV was utilized to reduce the radiation dose to as low as reasonably achievable. COMPARISON: CT of the head and MRI of the head dated 06/23/2017. CLINICAL HISTORY: Headache, increasing frequency or severity. FINDINGS: BRAIN AND VENTRICLES: No acute intracranial hemorrhage. No mass effect or midline shift. No extra-axial fluid collection. No evidence of acute infarct. Moderate dilatation of the lateral ventricles in proportion to the cerebral sulci, similar to the prior studies. On images 12 and 13 of series 2, there is a curvilinear area of increased attenuation present within the right anterior frontal lobe, which has a tubular appearance on the sagittal reformations on images 26 and 27 of series 5. The finding may represent thrombosis of a penetrating vessel or perhaps an aneurysm with adjacent hemorrhage. Calcification of a parenchymal lesion is also consideration. Because of concern for possible aneurysm, correlation with a CT angiogram of the head is suggested. MRI of the head without and with intravenous contrast is also recommended. 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 SINUSES AND MASTOIDS: No acute abnormality. SOFT TISSUES AND SKULL: No acute skull fracture. No acute soft tissue abnormality. The above findings and recommendations were discussed with Dr. Rogelia at 9:50 AM 10/30/2024. IMPRESSION: 1. Curvilinear area of increased attenuation in the right anterior frontal lobe, possibly representing thrombosis of a penetrating vessel, aneurysm with adjacent hemorrhage, or calcification of a parenchymal lesion. Correlation with CT angiogram of the head and MRI of the head without and with intravenous contrast is recommended. 2. Moderate dilatation of the lateral ventricles in proportion to the cerebral sulci, similar to prior studies. 3. 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 Electronically signed by: Evalene Coho MD 10/30/2024 09:53 AM EST RP Workstation: [id number]   DG Chest 2 View Result Date: 10/30/2024 CLINICAL DATA:  Cough. EXAM: CHEST - 2 VIEW COMPARISON:  Chest CT 06/29/2024 FINDINGS: Mild haziness in lungs but no focal lung disease. No overt pulmonary edema. Heart size is normal. Trachea is midline. No acute bone abnormality. Negative for a pneumothorax. No large pleural effusion. IMPRESSION: No active cardiopulmonary disease. Electronically Signed   By: Juliene Balder M.D.   On: 10/30/2024 09:17    EKG/Medicine  tests: Ordered and Independent interpretation EKG Interpretation Date/Time:  Monday October 30 2024 08:43:04 EST Ventricular Rate:  93 PR Interval:  197 QRS Duration:  93 QT Interval:  356 QTC Calculation: 443 R Axis:   -17  Text Interpretation: Sinus rhythm Left atrial enlargement Borderline left axis deviation Abnormal R-wave progression, early transition ST elevation, consider inferior injury Confirmed by Rogelia Satterfield ([id number]) on 10/30/2024 8:57:21 AM   Interventions:DuoNeb, aspirin    See the EMR for full details regarding lab and imaging results.  Aspirin  has been given.  Initial EKG with automatic  interpretation of MI, no overt ST elevation on my interpretation, there is significant baseline wander/artifact, therefore repeat EKGs obtained and are more reassuring, though patient does have questionable slight ST elevations, they do not meet STEMI criteria and patient does not have any reciprocal changes, additionally patient's complaint of burning chest pain similar to her GERD is less consistent with cardiac etiology, however do feel that patient warrants workup for chest pain.  The ECG reveals no anatomical ischemia representing STEMI, New-Onset Arrhythmia, or ischemic equivalent.  She has been risk stratified with a HEAR score of 4. Initial troponin is undetectable; delta troponin is undetectable.  The patient's presentation, the patient being hemodynamically stable, and the ECG are not consistent with Pericardial Tamponade. The patient's pain is not positional. This in conjunction with the lack of PR depressions and ST elevations on the ECG are reassuring against Pericarditis. The patient's non-elevated troponin and ECG are also inconsistent with Myocarditis.  The CXR is unremarkable for focal airspace disease.  The patient is afebrile and denies productive cough.  Therefore, I do not suspect Pneumonia. There is no evidence of Pneumothorax on physical exam or on the CXR. CXR shows no evidence of Esophageal Tear and there is no recent intractable emesis or esophageal instrumentation. There is no peritonitis or free air on CXR worrisome for a Perforated Abdominal Viscous.  Pulmonary Embolism is on the differential. The patient is at risk via the Revised Geneva Criteria. Therefore, we will further risk stratify the patient with a d-dimer.  This was WNL. Therefore, a CTA not indicated.  The patient's pain is not tearing and it does not radiate to back. Pulses are present bilaterally in both the upper and lower extremities. CXR does not show a widened mediastinum. I have a very low suspicion for Aortic  Dissection.  Patient is not short of breath, however does have cough as well as scattered wheezes, has history of COPD, therefore will administer DuoNeb for wheezing, as well as obtain VBG to evaluate for hypercarbia/acidosis.  Hypercarbia and acidosis are not demonstrated, doubt COPD exacerbation, BNP WNL, doubt heart failure exacerbation.  With regard to patient's sore throat, patient has a stable airway, no stridor.  Will evaluate for viral etiology as well as strep pharyngitis.  With regard to headache, patient does have headache, however no photophobia, no neck stiffness, negative Kernig's, Brudzinski's on exam, therefore doubt meningitis, patient without altered mental status, doubt encephalitis.  Negative strep screen, doubt strep pharyngitis.  Additionally, patient reports abdominal pain with onset today, as well as diffuse tenderness to palpation.  Possible that this is related to overall viral syndrome which commonly did display GI symptoms, however given diffuse tenderness, complex age and history, do feel that patient warrants screening labs and imaging.  CT of the abdomen pelvis was obtained and does not demonstrate intra-abdominal process.  Overall, patient's multisystem symptoms can be explained by her viral influenza as demonstrated by her RVP,  workup reassuring against emergent cardiopulmonary abnormality, however patient does have headache that is different and more severe than any prior headaches, therefore CT of the head is indicated.  This was obtained and does demonstrate hyperdensity within the right frontal lobe concerning for a possible mass versus ICH.  Spoke with radiology who recommended MRI as well as CTA head.  CTA does not definitively rule in versus rule out mass versus bleed.  These results were discussed with patient.  Patient will be transferred ED to ED to [facility] for MRI of the brain to further characterize this lesion, if patient without emergent intracranial  abnormality that requires admission/neurosurgical evaluation, consider discharge with neurosurgery follow-up and Tamiflu  prescription given patient's multiple risk factors.  {LSCOPA:33420}  Discussion of management or test interpretations with external provider(s): ***  Risk Drugs:{LSDRUGS:33399} Treatment: {LSTREATMENT:33409} Surgery:{LSSURGERY:33410} Critical Care: ***  Disposition: {LSDISPO:33388}  MDM generated using voice dictation software and may contain dictation errors.  Please contact me for any clarification or with any questions.  Clinical Impression:  1. Influenza   2. Abnormal CT of the head      Transfer via Transport   Final Clinical Impression(s) / ED Diagnoses Final diagnoses:  Influenza  Abnormal CT of the head    Rx / DC Orders ED Discharge Orders     None

## 2024-10-30 NOTE — ED Triage Notes (Signed)
 Reports non-productive dry cough, headache, sore throat & chest pain with cough.

## 2024-10-30 NOTE — ED Provider Notes (Signed)
 Care assumed of [facility] Dr. Rogelia. Please see their note for further information.   Briefly: Patient presents with cough, chest pain, headache, abdominal pain. Found to be flu A positive, however also with abnormal findings on CTA head and neck, sent here for MRI with and without contrast.   Plan: MRI with and without pending at shift change, spoke with Dr. Rogelia who reports if negative/nonemergent findings, suspect d/c with Tamiflu  and outpatient follow-up.   Upon my assessment, patient complaining of persistent 8/10 headache different from her previous headaches. Also requesting cough medications. Agrees that she feels like symptoms can be managed at home.   Patient given morphine  and delsym , Tamiflu  for symptoms.   MRI and reassessment pending at shift change, will determine dispo.   Care handoff to Morene Barrio, MD at shift change. Please see their note for continued evaluation and disposition   Devinn Voshell A, PA-C 10/30/24 1547    Tegeler, Lonni PARAS, MD 10/30/24 [phone]

## 2024-10-30 NOTE — ED Notes (Signed)
 [facility] at bedside. Tillman, RN reached out to charge nurse via phone call and secure message. Awaiting reply.

## 2024-10-31 ENCOUNTER — Encounter: Payer: Self-pay | Admitting: Family Medicine

## 2024-10-31 ENCOUNTER — Telehealth: Admitting: Family Medicine

## 2024-10-31 ENCOUNTER — Ambulatory Visit: Payer: Self-pay

## 2024-10-31 VITALS — Temp 99.6°F

## 2024-10-31 DIAGNOSIS — E1165 Type 2 diabetes mellitus with hyperglycemia: Secondary | ICD-10-CM

## 2024-10-31 DIAGNOSIS — J449 Chronic obstructive pulmonary disease, unspecified: Secondary | ICD-10-CM

## 2024-10-31 DIAGNOSIS — J101 Influenza due to other identified influenza virus with other respiratory manifestations: Secondary | ICD-10-CM

## 2024-10-31 DIAGNOSIS — I10 Essential (primary) hypertension: Secondary | ICD-10-CM

## 2024-10-31 MED ORDER — OSELTAMIVIR PHOSPHATE 75 MG PO CAPS: 75.0000 mg | ORAL_CAPSULE | Freq: Two times a day (BID) | ORAL | 0 refills | Status: AC

## 2024-10-31 NOTE — Telephone Encounter (Signed)
 FYI Only or Action Required?: FYI only for provider: appointment scheduled on 10/31/24.  Patient was last seen in primary care on 09/26/2024 by Katrinka Garnette KIDD, MD.  Called Nurse Triage reporting Shortness of Breath and Cough.  Symptoms began several days ago.  Interventions attempted: Nothing.  Symptoms are: unchanged.  Triage Disposition: Home Care  Patient/caregiver understands and will follow disposition?: No, wishes to speak with PCP   Copied from CRM #[id number]. Topic: Clinical - Red Word Triage >> Oct 31, 2024  8:46 AM Lisa [name] wrote: Red Word that prompted transfer to Nurse Triage:  ED on 10/30/24 Symptoms: diagnosed with flu A -CT of head, AVM Arteriovenous Malformation -couging, hurts to cough -short of breath when walking -rattling in chest when breathing -headache not a bad as yesterday -head rush/ not dizzy -woke up this morning cold -99.6 last night -99.0 right now -just doesn't feel good Reason for Disposition  Cough with cold symptoms (e.g., runny nose, postnasal drip, throat clearing)  Answer Assessment - Initial Assessment Questions Patient called for hospital f/u appt only.  Patient seen in ED  10/30/24; diagnosed with Flu A; requested video visit.  Advised call back or ED/911 if symptoms worsen.  1. ONSET: When did the cough begin?      Several days ago 2. SEVERITY: How bad is the cough today?      moderate 3. SPUTUM: Describe the color of your sputum (e.g., none, dry cough; clear, white, yellow, green)     none 4. HEMOPTYSIS: Are you coughing up any blood? If Yes, ask: How much? (e.g., flecks, streaks, tablespoons, etc.)     no 5. DIFFICULTY BREATHING: Are you having difficulty breathing? If Yes, ask: How bad is it? (e.g., mild, moderate, severe)      Moderate with cough 6. FEVER: Do you have a fever? If Yes, ask: What is your temperature, how was it measured, and when did it start?     98.6  10. OTHER SYMPTOMS: Do you have  any other symptoms? (e.g., runny nose, wheezing, chest pain)       Denies wheezing, chest pain,  Sob with coughing, chest discomfort with cough  Protocols used: Cough - Acute Non-Productive-A-AH

## 2024-10-31 NOTE — Patient Instructions (Addendum)
:   Influenza A has been very challenging for patient.  She was given a dose of Tamiflu  yesterday evening but did not have Tamiflu  sent in for her 5 send and the remaining 9 doses for her.  I offered prophylaxis for her husband but she declined at this time but will discuss with him further. She has COPD and I refilled her albuterol - with some cough and wheeze- if this were to fail to improve or worsen we discussed sending in prednisone  as well.   Finally, we reviewed reasons to return to care including if symptoms worsen or persist or new concerns arise.   Recommended follow up: Return for as needed for new, worsening, persistent symptoms.

## 2024-10-31 NOTE — Telephone Encounter (Signed)
 Patient scheduled for virtual appt today at 120 with Dr. Katrinka. Dr. Katrinka will review triage notes.

## 2024-10-31 NOTE — Progress Notes (Signed)
 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 Our team/I discussed the limitations of evaluation and management by telemedicine and the availability of in person appointments.No physical exam was performed (except for noted visual exam or audio findings with Telehealth visits).   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    Priority: High   Morbid obesity (HCC) 02/14/2016    Priority: High   Tobacco use disorder 08/12/2015    Priority: High   COPD (chronic obstructive pulmonary disease) (HCC) 01/18/2015    Priority: High   History of small bowel obstruction 01/24/2024    Priority: Medium    Multinodular goiter 06/10/2022    Priority: Medium    Anxiety 03/24/2022    Priority: Medium    Aortic atherosclerosis 09/10/2021    Priority: Medium    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    Priority: Medium     CKD stage 3a, GFR 45-59 ml/min (HCC) 09/18/2020    Priority: Medium    Fatty liver 11/06/2019    Priority: Medium    Hyperlipidemia 04/12/2018    Priority: Medium    Hyperlipidemia associated with type 2 diabetes mellitus (HCC) 09/06/2017    Priority: Medium    Breast mass 08/03/2017    Priority: Medium    GERD (gastroesophageal reflux disease) 02/14/2016    Priority: Medium    DOE (dyspnea on exertion) 03/08/2014    Priority: Medium    Migraine without aura 07/26/2013    Priority: Medium    Sleep apnea 02/11/2012    Priority: Medium    Chest discomfort 10/12/2011    Priority: Medium    Essential hypertension 10/12/2011    Priority: Medium    Chronic rhinitis 06/26/2019    Priority: Low   Trigger finger, right middle finger 05/04/2019    Priority: Low   Bilateral leg pain 04/14/2018    Priority: Low   S/P bronchoscopy with biopsy     Priority: Low   Chronic hip pain 07/29/2017    Priority: Low   Left knee pain 05/15/2016    Priority: Low   Flank pain 04/21/2016    Priority: Low   Abdominal pain 02/28/2016    Priority: Low   Pulmonary nodule, right 04/11/2015    Priority: Low   Mediastinal lymphadenopathy 01/18/2015    Priority: Low   Type 2 diabetes mellitus with stage 3a chronic kidney disease, without long-term current use of insulin  (HCC) 06/10/2022  Medications- reviewed and updated Current Outpatient Medications  Medication Sig Dispense Refill   oseltamivir  (TAMIFLU ) 75 MG capsule Take 1 capsule (75 mg total) by mouth 2 (two) times daily. 9 capsule 0   albuterol  (VENTOLIN  HFA) 108 (90 Base) MCG/ACT inhaler Inhale 2 puffs into the lungs every 6 (six) hours as needed for wheezing or shortness of breath (or cough.). 18 g 5   allopurinol  (ZYLOPRIM ) 100 MG tablet Take 2 tablets (200 mg total) by mouth daily. 180 tablet 1   ASPIRIN  81 PO Take 81 mg by mouth daily.     buPROPion  (WELLBUTRIN  XL) 150 MG 24 hr tablet TAKE 1 TABLET BY MOUTH EVERY DAY 90 tablet 3    carvedilol  (COREG ) 25 MG tablet TAKE 1 TABLET BY MOUTH TWICE A DAY 180 tablet 0   Evolocumab  (REPATHA  SURECLICK) 140 MG/ML SOAJ INJECT 140 MG INTO THE SKIN EVERY 14 (FOURTEEN) DAYS. (Patient not taking: Reported on 10/31/2024) 6 mL 3   glipiZIDE  (GLUCOTROL ) 5 MG tablet Take 1 tablet (5 mg total) by mouth daily before breakfast AND 2 tablets (10 mg total) daily before supper. 270 tablet 3   lidocaine  (XYLOCAINE ) 2 % solution Use as directed 15 mLs in the mouth or throat every 3 (three) hours as needed for mouth pain (gargle; may spit or swallow). (Patient not taking: Reported on 10/31/2024) 100 mL 0   ONE TOUCH LANCETS MISC Use to check blood sugars twice a day 200 each 0   pantoprazole  (PROTONIX ) 40 MG tablet TAKE 1 TABLET BY MOUTH 30 MINUTES BEFORE BREAKFAST DAILY 90 tablet 0   telmisartan  (MICARDIS ) 40 MG tablet TAKE 1 TABLET BY MOUTH EVERY DAY 90 tablet 3   No current facility-administered medications for this visit.     Objective:  Temp 99.6 F (37.6 C) (Oral)  self reported vitals Gen: appears fatigued and frequent cough though no audible wheeze Lungs: nonlabored, normal respiratory rate  Skin: appears dry, no obvious rash     Assessment and Plan    # Emergency department follow-up for influenza S: Patient presented to the hospital with cough, chest pain, headache, abdominal pain-ultimately found to have influenza A.  She was ultimately planned for discharge with Tamiflu  but this was not sent.  Strep test was negative as well as respiratory viral panel outside of influenza A. Body aches as well noted- tylenol  helping that and headaches  She also had an abnormal CT of the head and neck and was sent to [facility] as she needed MRI.  She had persistent 8 out of 10 headache that was different from her normal headaches.  Ultimately had a potential AVM not previously noted on imaging with possible washing on imaging but no obvious acute bleed found and plan was for outpatient  neurosurgery arteriogram with Dr. Lanis  She also reported chest discomfort developing day of hospitalization.  EKG is overall stable.  CMP reassuring other than high glucose at 365.  D-dimer not elevated.  CBC largely reassuring with hemoglobin at 15.2 only mildly elevated.  Lipase not elevated.  Urinalysis with glucose in the urine and dehydration but otherwise reassuring.  proBNP not elevated.  Troponin trend was not elevated  She also complained of abdominal pain and had a CT of her abdomen pelvis.  Largely reassuring other than fatty liver  Ongoing low grade fevers- taking tylenol . Still coughing regularly and hurts chest wall. Abdomen hurts from coughing.  A/P: Influenza A has been very challenging for patient.  She was  given a dose of Tamiflu  yesterday evening but did not have Tamiflu  sent in for her 5 send and the remaining 9 doses for her.  I offered prophylaxis for her husband but she declined at this time but will discuss with him further. She has COPD and I refilled her albuterol - with some cough and wheeze- if this were to fail to improve or worsen we discussed sending in prednisone  as well.   Finally, we reviewed reasons to return to care including if symptoms worsen or persist or new concerns arise.  #new arteriovenous malformation(s) (AVM)- seeing neurosurgery decmember 2025  # Diabetes-sees Dr. Sam S: Medication:Glipizide  5 mg in the morning and 10 mg in the evening  Lab Results  Component Value Date   HGBA1C 8.8 (A) 05/01/2024   HGBA1C 10.5 (H) 01/06/2024   HGBA1C 9.6 (H) 09/07/2023  A/P: poor control in Emergency Department- discussed hopefully improving as she treats illness- but she was over 300 yesterday- she agrees to contact Dr. Trixie if not trending down but honestly she needs to be seen back anyway- patient more open to inslulin now with other options limited  #hypertension # CKD stage III- but GFR yesterday over 60  S: medication: Telmisartan  40 mg,  coreg  25 mg twice daily  BP Readings from Last 3 Encounters:  10/30/24 (!) 154/78  09/26/24 130/78  08/30/24 130/82  A/P: blood pressure high in Emergency Department yesterday- we will need to recheck when she is feeling better. Thankfully CKD III looked good yesterday with GFR actually over 60! If persists in improved range may change to CKD II  - ill ask her thorough mychart  #arteriovenous malformation(s) (AVM)- gave her # to schedule neurosurgery follow up    Recommended follow up: as needed for new, worsening, persistent symptoms   Lab/Order associations:   ICD-10-CM   1. Influenza A  J10.1     2. Chronic obstructive pulmonary disease, unspecified COPD type (HCC)  J44.9     3. Essential hypertension  I10     4. Type 2 diabetes mellitus with hyperglycemia, without long-term current use of insulin  (HCC)  E11.65       Meds ordered this encounter  Medications   oseltamivir  (TAMIFLU ) 75 MG capsule    Sig: Take 1 capsule (75 mg total) by mouth 2 (two) times daily.    Dispense:  9 capsule    Refill:  0   Return precautions advised.  Garnette Lukes, MD

## 2024-11-04 ENCOUNTER — Telehealth: Admitting: Nurse Practitioner

## 2024-11-04 DIAGNOSIS — J069 Acute upper respiratory infection, unspecified: Secondary | ICD-10-CM | POA: Diagnosis not present

## 2024-11-04 MED ORDER — BENZONATATE 200 MG PO CAPS: 200.0000 mg | ORAL_CAPSULE | Freq: Two times a day (BID) | ORAL | 0 refills | Status: AC | PRN

## 2024-11-04 MED ORDER — PREDNISONE 20 MG PO TABS: 20.0000 mg | ORAL_TABLET | Freq: Every day | ORAL | 0 refills | Status: AC

## 2024-11-04 NOTE — Patient Instructions (Signed)
 Arland ONEIDA Kipper, thank you for joining Haze LELON Servant, NP for today's virtual visit.  While this provider is not your primary care provider (PCP), if your PCP is located in our provider database this encounter information will be shared with them immediately following your visit.   A [facility] MyChart account gives you access to today's visit and all your visits, tests, and labs performed at [facility]  click here if you don't have a [facility] MyChart account or go to [email or web address]  Consent: (Patient) Lisa [name] provided verbal consent for this virtual visit at the beginning of the encounter.  Current Medications:  Current Outpatient Medications:    benzonatate  (TESSALON ) 200 MG capsule, Take 1 capsule (200 mg total) by mouth 2 (two) times daily as needed for cough., Disp: 20 capsule, Rfl: 0   predniSONE  (DELTASONE ) 20 MG tablet, Take 1 tablet (20 mg total) by mouth daily with breakfast., Disp: 5 tablet, Rfl: 0   albuterol  (VENTOLIN  HFA) 108 (90 Base) MCG/ACT inhaler, Inhale 2 puffs into the lungs every 6 (six) hours as needed for wheezing or shortness of breath (or cough.)., Disp: 18 g, Rfl: 5   allopurinol  (ZYLOPRIM ) 100 MG tablet, Take 2 tablets (200 mg total) by mouth daily., Disp: 180 tablet, Rfl: 1   ASPIRIN  81 PO, Take 81 mg by mouth daily., Disp: , Rfl:    buPROPion  (WELLBUTRIN  XL) 150 MG 24 hr tablet, TAKE 1 TABLET BY MOUTH EVERY DAY, Disp: 90 tablet, Rfl: 3   carvedilol  (COREG ) 25 MG tablet, TAKE 1 TABLET BY MOUTH TWICE A DAY, Disp: 180 tablet, Rfl: 0   Evolocumab  (REPATHA  SURECLICK) 140 MG/ML SOAJ, INJECT 140 MG INTO THE SKIN EVERY 14 (FOURTEEN) DAYS. (Patient not taking: Reported on 10/31/2024), Disp: 6 mL, Rfl: 3   glipiZIDE  (GLUCOTROL ) 5 MG tablet, Take 1 tablet (5 mg total) by mouth daily before breakfast AND 2 tablets (10 mg total) daily before supper., Disp: 270 tablet, Rfl: 3   lidocaine  (XYLOCAINE ) 2 % solution, Use as directed 15 mLs in the  mouth or throat every 3 (three) hours as needed for mouth pain (gargle; may spit or swallow). (Patient not taking: Reported on 10/31/2024), Disp: 100 mL, Rfl: 0   ONE TOUCH LANCETS MISC, Use to check blood sugars twice a day, Disp: 200 each, Rfl: 0   oseltamivir  (TAMIFLU ) 75 MG capsule, Take 1 capsule (75 mg total) by mouth 2 (two) times daily., Disp: 9 capsule, Rfl: 0   pantoprazole  (PROTONIX ) 40 MG tablet, TAKE 1 TABLET BY MOUTH 30 MINUTES BEFORE BREAKFAST DAILY, Disp: 90 tablet, Rfl: 0   telmisartan  (MICARDIS ) 40 MG tablet, TAKE 1 TABLET BY MOUTH EVERY DAY, Disp: 90 tablet, Rfl: 3   Medications ordered in this encounter:  Meds ordered this encounter  Medications   benzonatate  (TESSALON ) 200 MG capsule    Sig: Take 1 capsule (200 mg total) by mouth 2 (two) times daily as needed for cough.    Dispense:  20 capsule    Refill:  0    Supervising Provider:   LAMPTEY, PHILIP O [[id number]]   predniSONE  (DELTASONE ) 20 MG tablet    Sig: Take 1 tablet (20 mg total) by mouth daily with breakfast.    Dispense:  5 tablet    Refill:  0    Supervising Provider:   LAMPTEY, PHILIP O [[id number]]     *If you need refills on other medications prior to your next appointment, please contact your pharmacy*  Follow-Up: Call  back or seek an in-person evaluation if the symptoms worsen or if the condition fails to improve as anticipated.  [facility] [phone]  Other Instructions INSTRUCTIONS: use a humidifier for nasal congestion Drink plenty of fluids, rest and wash hands frequently to avoid the spread of infection Alternate tylenol  and Motrin for relief of fever    If you have been instructed to have an in-person evaluation today at a local Urgent Care facility, please use the link below. It will take you to a list of all of our available [facility], including address, phone number and hours of operation. Please do not delay care.  [facility]  If you or a  family member do not have a primary care provider, use the link below to schedule a visit and establish care. When you choose a [facility] primary care physician or advanced practice provider, you gain a long-term partner in health. Find a Primary Care Provider  Learn more about [facility] in-office and virtual care options: [facility] - Get Care Now

## 2024-11-04 NOTE — Progress Notes (Signed)
 Virtual Visit Consent   Lisa [name], you are scheduled for a virtual visit with a [facility] provider today. Just as with appointments in the office, your consent must be obtained to participate. Your consent will be active for this visit and any virtual visit you may have with one of our providers in the next 365 days. If you have a MyChart account, a copy of this consent can be sent to you electronically.  As this is a virtual visit, video technology does not allow for your provider to perform a traditional examination. This may limit your provider's ability to fully assess your condition. If your provider identifies any concerns that need to be evaluated in person or the need to arrange testing (such as labs, EKG, etc.), we will make arrangements to do so. Although advances in technology are sophisticated, we cannot ensure that it will always work on either your end or our end. If the connection with a video visit is poor, the visit may have to be switched to a telephone visit. With either a video or telephone visit, we are not always able to ensure that we have a secure connection.  By engaging in this virtual visit, you consent to the provision of healthcare and authorize for your insurance to be billed (if applicable) for the services provided during this visit. Depending on your insurance coverage, you may receive a charge related to this service.  I need to obtain your verbal consent now. Are you willing to proceed with your visit today? Lisa [name] has provided verbal consent on 11/04/2024 for a virtual visit (video or telephone). Haze LELON Servant, NP  Date: 11/04/2024 7:28 PM   Virtual Visit via Video Note   I, Haze LELON Servant, connected with  Lisa [name]  ([id number], 01-24-1961) on 11/04/24 at  7:15 PM EST by a video-enabled telemedicine application and verified that I am speaking with the correct person using two identifiers.  Location: Patient: Virtual Visit Location  Patient: Home Provider: Virtual Visit Location Provider: Home Office   I discussed the limitations of evaluation and management by telemedicine and the availability of in person appointments. The patient expressed understanding and agreed to proceed.    History of Present Illness: Lisa [name] is a 63 y.o. who identifies as a female who was assigned female at birth, and is being seen today for uri with cough and congestion.   Despite recently completing tamiflu  Lisa [name] has been experiencing persistent cough, chest pain with coughing, fatigue and congestion. She is using her inhaler with little relief. Other associated symptoms include headache.   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   CKD stage 3a, GFR 45-59 ml/min (HCC) 09/18/2020   Fatty liver 11/06/2019   Chronic rhinitis 06/26/2019   Trigger finger, right middle finger 05/04/2019   Coronary artery calcification 04/20/2019   Bilateral leg pain 04/14/2018   Hyperlipidemia 04/12/2018   S/P bronchoscopy with biopsy    Hyperlipidemia associated with type 2 diabetes mellitus (HCC) 09/06/2017   Breast mass 08/03/2017   Chronic hip pain 07/29/2017  PAD (peripheral artery disease) 01/19/2017   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   Left knee pain 05/15/2016   Flank pain 04/21/2016   Abdominal pain 02/28/2016   GERD (gastroesophageal reflux disease) 02/14/2016   Morbid obesity (HCC) 02/14/2016   Tobacco use disorder 08/12/2015   Pulmonary nodule, right  04/11/2015   COPD (chronic obstructive pulmonary disease) (HCC) 01/18/2015   Mediastinal lymphadenopathy 01/18/2015   DOE (dyspnea on exertion) 03/08/2014   Migraine without aura 07/26/2013   Sleep apnea 02/11/2012   Chest discomfort 10/12/2011   Essential hypertension 10/12/2011    Allergies:  Allergies  Allergen Reactions   Almond Oil Anaphylaxis and Nausea And Vomiting    Almond-Nuts Only   Apple Juice Anaphylaxis   Ceftin  [Cefuroxime ] Other (See Comments)    Severe stomach pain - Diverticulitis flares up   Cherry Extract Anaphylaxis    Cherry fruit only    Other Anaphylaxis and Itching    Most fruits- Are NOT tolerated (PLEASE ASK PATIENT BEFORE GIVING, AS CERTAIN FRUITS ARE TOLERATED IN LIMITED QUANTITIES!!)   Guaifenesin  & Derivatives Other (See Comments)    Restlessness, jittery   Hctz [Hydrochlorothiazide] Other (See Comments)    Causes a headache   Naproxen Other (See Comments)    Insomnia   Walnut Itching    Throat itching   Lisinopril  Cough        Medications:  Current Outpatient Medications:    benzonatate  (TESSALON ) 200 MG capsule, Take 1 capsule (200 mg total) by mouth 2 (two) times daily as needed for cough., Disp: 20 capsule, Rfl: 0   predniSONE  (DELTASONE ) 20 MG tablet, Take 1 tablet (20 mg total) by mouth daily with breakfast., Disp: 5 tablet, Rfl: 0   albuterol  (VENTOLIN  HFA) 108 (90 Base) MCG/ACT inhaler, Inhale 2 puffs into the lungs every 6 (six) hours as needed for wheezing or shortness of breath (or cough.)., Disp: 18 g, Rfl: 5   allopurinol  (ZYLOPRIM ) 100 MG tablet, Take 2 tablets (200 mg total) by mouth daily., Disp: 180 tablet, Rfl: 1   ASPIRIN  81 PO, Take 81 mg by mouth daily., Disp: , Rfl:    buPROPion  (WELLBUTRIN  XL) 150 MG 24 hr tablet, TAKE 1 TABLET BY MOUTH EVERY DAY, Disp: 90 tablet, Rfl: 3   carvedilol  (COREG ) 25 MG tablet, TAKE 1 TABLET BY MOUTH TWICE A DAY, Disp: 180 tablet, Rfl: 0   Evolocumab  (REPATHA  SURECLICK) 140 MG/ML SOAJ, INJECT  140 MG INTO THE SKIN EVERY 14 (FOURTEEN) DAYS. (Patient not taking: Reported on 10/31/2024), Disp: 6 mL, Rfl: 3   glipiZIDE  (GLUCOTROL ) 5 MG tablet, Take 1 tablet (5 mg total) by mouth daily before breakfast AND 2 tablets (10 mg total) daily before supper., Disp: 270 tablet, Rfl: 3   lidocaine  (XYLOCAINE ) 2 % solution, Use as directed 15 mLs in the mouth or throat every 3 (three) hours as needed for mouth pain (gargle; may spit or swallow). (Patient not taking: Reported on 10/31/2024), Disp: 100 mL, Rfl: 0   ONE TOUCH LANCETS MISC, Use to check blood sugars twice a day, Disp: 200 each, Rfl: 0   oseltamivir  (TAMIFLU ) 75 MG capsule, Take 1 capsule (75 mg total) by mouth 2 (two) times daily., Disp: 9 capsule, Rfl: 0   pantoprazole  (PROTONIX ) 40 MG tablet, TAKE 1 TABLET BY MOUTH 30 MINUTES BEFORE BREAKFAST DAILY, Disp: 90 tablet, Rfl: 0   telmisartan  (MICARDIS ) 40 MG tablet, TAKE 1 TABLET BY MOUTH EVERY DAY, Disp: 90 tablet, Rfl: 3  Observations/Objective: Patient  is well-developed, well-nourished in no acute distress.  Resting comfortably at home.  Head is normocephalic, atraumatic.  No labored breathing.  Speech is clear and coherent with logical content.  Patient is alert and oriented at baseline.    Assessment and Plan: 1. URI with cough and congestion (Primary) - benzonatate  (TESSALON ) 200 MG capsule; Take 1 capsule (200 mg total) by mouth 2 (two) times daily as needed for cough.  Dispense: 20 capsule; Refill: 0 - predniSONE  (DELTASONE ) 20 MG tablet; Take 1 tablet (20 mg total) by mouth daily with breakfast.  Dispense: 5 tablet; Refill: 0  INSTRUCTIONS: use a humidifier for nasal congestion Drink plenty of fluids, rest and wash hands frequently to avoid the spread of infection Alternate tylenol  and Motrin for relief of fever   Follow Up Instructions: I discussed the assessment and treatment plan with the patient. The patient was provided an opportunity to ask questions and all were  answered. The patient agreed with the plan and demonstrated an understanding of the instructions.  A copy of instructions were sent to the patient via MyChart unless otherwise noted below.    The patient was advised to call back or seek an in-person evaluation if the symptoms worsen or if the condition fails to improve as anticipated.    [place] Pelto W Kanani Mowbray, NP

## 2024-11-05 ENCOUNTER — Ambulatory Visit (HOSPITAL_COMMUNITY): Payer: Self-pay

## 2024-11-07 ENCOUNTER — Ambulatory Visit: Payer: Self-pay

## 2024-11-07 ENCOUNTER — Other Ambulatory Visit: Payer: Self-pay | Admitting: Family Medicine

## 2024-11-07 NOTE — Telephone Encounter (Signed)
 I would recommend restarting glipizide  and holding the prednisone -Sugar still may be somewhat high but hoping will come back down within a couple days.  I also had strongly advised her to follow-up with endocrinology and I would recommend that again at this time.  Another option would be giving her some insulin  to help bring this down

## 2024-11-07 NOTE — Telephone Encounter (Signed)
Please see patient message and advise.

## 2024-11-07 NOTE — Telephone Encounter (Signed)
 FYI Only or Action Required?: Action required by provider: update on patient condition.  Patient was last seen in primary care on 11/04/2024 by Theotis Haze ORN, NP.  Called Nurse Triage reporting Hyperglycemia.  Symptoms began today.  Interventions attempted: Nothing.  Symptoms are: stable.  Triage Disposition: Call PCP Now  Patient/caregiver understands and will follow disposition?: Yes                Copied from CRM #[id number]. Topic: Clinical - Red Word Triage >> Nov 07, 2024 10:32 AM Victoria A wrote: [facility] that prompted transfer to Nurse Triage: Patient blood glucose is 415 -she feels very sleepy Reason for Disposition  Blood glucose > 400 mg/dL (77.7 mmol/L)  Answer Assessment - Initial Assessment Questions Patient had telehealth visit and was prescribed prednisone  and tessalon . Patient states she has been taking the prednisone  and was not aware that could raise her blood sugar, in addition she has missed the last 3 days of glipizide .   1. BLOOD GLUCOSE: What is your blood glucose level?      415.  2. ONSET: When did you check the blood glucose?     Around 10:20 this morning.  3. USUAL RANGE: What is your glucose level usually? (e.g., usual fasting morning value, usual evening value)     8 days ago was 365, usual rang 100-200s.  4. KETONES: Do you check for ketones (urine or blood test strips)? If Yes, ask: What does the test show now?      No.  5. TYPE 1 or 2:  Do you know what type of diabetes you have?  (e.g., Type 1, Type 2, Gestational; doesn't know)      Type 2.  6. INSULIN : Do you take insulin ? What type of insulin (s) do you use? What is the mode of delivery? (syringe, pen; injection or pump)?      No.  7. DIABETES PILLS: Do you take any pills for your diabetes? If Yes, ask: Have you missed taking any pills recently?     Glipizide , she states she has not taken her glipizide  in about 3 days due to just being sick from the flu,  she was more focused on taking the medications for flu. RN advised patient to take her morning dose of glipizide .  8. OTHER SYMPTOMS: Do you have any symptoms? (e.g., fever, frequent urination, difficulty breathing, dizziness, weakness, vomiting)     Patient able to stand and walk but states she got up this morning and was using the bathroom and felt sleepy/tired and that alerted her to check her blood sugar. She has been sick with Flu A since 10/29/24 and has still had cough/SOB from cough.  Protocols used: Diabetes - High Blood Sugar-A-AH

## 2024-11-08 ENCOUNTER — Ambulatory Visit: Payer: Self-pay

## 2024-11-08 NOTE — Telephone Encounter (Signed)
 FYI Only or Action Required?: Action required by provider: update on patient condition. Requesting insulin .  Patient was last seen in primary care on 11/04/2024 by Theotis Haze ORN, NP.  Called Nurse Triage reporting Blood Sugar Problem.  Symptoms began several months ago.  Interventions attempted: Prescription medications: glipizide .  Symptoms are: gradually improving.  Triage Disposition: Call PCP Now  Patient/caregiver understands and will follow disposition?: Yes  Copied from CRM #[id number]. Topic: Clinical - Red Word Triage >> Nov 08, 2024  9:54 AM Anairis L wrote: [facility] that prompted transfer to Nurse Triage: 11pm High blood sugar 437 Reason for Disposition  [1] Blood glucose > 300 mg/dL (83.2 mmol/L) AND [7] two or more times in a row  Answer Assessment - Initial Assessment Questions 1. SYMPTOMS: What symptoms are you concerned about?     BS 387 about 10 minutes ago. Patient wants to go on insulin . States she already restarted glipizidie. Patient still getting over flu and using asthma inhaler. [facility] [address] 2. ONSET:  When did the symptoms start?     *No Answer* 3. BLOOD GLUCOSE: What is your blood glucose level?      387 4. USUAL RANGE: What is your blood glucose level usually? (e.g., usual fasting morning value, usual evening value)     Has been in 400s recently. 5. TYPE 1 or 2:  Do you know what type of diabetes you have?  (e.g., Type 1, Type 2, Gestational; doesn't know)      *No Answer* 6. INSULIN : Do you take insulin ? What type of insulin (s) do you use? What is the mode of delivery? (syringe, pen; injection or pump) When did you last give yourself an insulin  dose? (i.e., time or hours/minutes ago) How much did you give? (i.e., how many units)     *No Answer* 7. DIABETES PILLS: Do you take any pills for your diabetes? If Yes, ask: What is the name of the medicine(s) that you take for high blood sugar?     Glizidine 8. OTHER SYMPTOMS:  Do you have any symptoms? (e.g., fever, frequent urination, difficulty breathing, vomiting)     Blood in urine, states doctor aware. Feels a little woozy.  Answer Assessment - Initial Assessment Questions 1. SYMPTOMS: What symptoms are you concerned about?     BS 387 about 10 minutes ago. Patient wants to go on insulin . States she already restarted glipizidie. Dr Carolee message from mychart relayed to patient. States she will make an endocrinology appointment. Patient still getting over flu and using asthma inhaler as needed. [facility] [address] 3. BLOOD GLUCOSE: What is your blood glucose level?      387 4. USUAL RANGE: What is your blood glucose level usually? (e.g., usual fasting morning value, usual evening value)     Has been in 400s recently. 7. DIABETES PILLS: Do you take any pills for your diabetes? If Yes, ask: What is the name of the medicine(s) that you take for high blood sugar?     Glizidine 8. OTHER SYMPTOMS: Do you have any symptoms? (e.g., fever, frequent urination, difficulty breathing, vomiting)     Blood in urine, states doctor aware and states she have this for life. Feels a little woozy. Encouraged fluids and moving slowly. She states not bad enough to need treatment from physician.  Protocols used: Diabetes - Low Blood Sugar-A-AH, Diabetes - High Blood Sugar-A-AH

## 2024-11-08 NOTE — Telephone Encounter (Signed)
 Noted. Patient scheduling Endocrinology appt.

## 2024-11-16 ENCOUNTER — Telehealth: Payer: Self-pay | Admitting: Internal Medicine

## 2024-11-16 NOTE — Telephone Encounter (Signed)
 Left message for patient to call but also sent info to mychart.

## 2024-11-16 NOTE — Telephone Encounter (Signed)
 Patient went to PCP on 10/30/2024 and was diagnosed with the flu.  Patient states that 6 days later her PCP prescribed her Prednisone  saying that she had the flu.  Patient states that since then her blood sugar level has stayed in the upper 300's to lower 400's.  Patient states that she is still on the glipiZIDE  (GLUCOTROL ) 5 MG tablet .   Patient is unable to give exact readings at this time due to driving.  Patient would like to know what she needs to do.

## 2024-11-17 NOTE — Telephone Encounter (Signed)
 Primary care did not recommend prednisone - she was on that through a virtual visit not part of her PCP office- I would not have recommended this with her poorly controlled diabetes. She to Emergency Department and I'm not sure why they didn't tprescribe tamiflu - I gave it to her the next day on a video visit. She had a video visit 4 days later where she was given prednisone .   Regardless- she's no longer on prednisone  and I've encouraged her to follow up to try to get her sugar under control with endocrinology team. She in general needs to do a better job with follow up

## 2024-11-20 ENCOUNTER — Telehealth: Payer: Self-pay | Admitting: Nutrition

## 2024-11-20 ENCOUNTER — Telehealth: Payer: Self-pay | Admitting: Dietician

## 2024-11-20 NOTE — Telephone Encounter (Signed)
 LVM to call office with blood sugar readings.

## 2024-11-20 NOTE — Telephone Encounter (Signed)
 Message received to follow up with patient re:  hyperglycemia with steroids.  Called patient but she was not available.  Recommended that patient call [facility] with her blood glucose readings.  [phone]  Leita Constable, RD, LDN, CDCES, DipACLM

## 2024-12-13 ENCOUNTER — Other Ambulatory Visit: Payer: Self-pay | Admitting: Internal Medicine

## 2024-12-13 ENCOUNTER — Ambulatory Visit: Admitting: Internal Medicine

## 2024-12-13 ENCOUNTER — Other Ambulatory Visit

## 2024-12-13 ENCOUNTER — Encounter: Payer: Self-pay | Admitting: Internal Medicine

## 2024-12-13 VITALS — BP 144/80 | HR 88 | Resp 18 | Ht 65.0 in | Wt 248.0 lb

## 2024-12-13 DIAGNOSIS — E1122 Type 2 diabetes mellitus with diabetic chronic kidney disease: Secondary | ICD-10-CM | POA: Diagnosis not present

## 2024-12-13 DIAGNOSIS — Z794 Long term (current) use of insulin: Secondary | ICD-10-CM

## 2024-12-13 DIAGNOSIS — E042 Nontoxic multinodular goiter: Secondary | ICD-10-CM

## 2024-12-13 DIAGNOSIS — E1165 Type 2 diabetes mellitus with hyperglycemia: Secondary | ICD-10-CM

## 2024-12-13 DIAGNOSIS — N183 Chronic kidney disease, stage 3 unspecified: Secondary | ICD-10-CM

## 2024-12-13 LAB — POCT GLYCOSYLATED HEMOGLOBIN (HGB A1C): Hemoglobin A1C: 12.7 % — AB (ref 4.0–5.6)

## 2024-12-13 MED ORDER — NOVOLOG FLEXPEN 100 UNIT/ML ~~LOC~~ SOPN: 10.0000 [IU] | PEN_INJECTOR | Freq: Three times a day (TID) | SUBCUTANEOUS | 3 refills | Status: AC

## 2024-12-13 MED ORDER — DEXCOM G7 SENSOR MISC: 1.0000 | 3 refills | Status: AC

## 2024-12-13 MED ORDER — TRESIBA FLEXTOUCH 100 UNIT/ML ~~LOC~~ SOPN: 22.0000 [IU] | PEN_INJECTOR | Freq: Every day | SUBCUTANEOUS | 3 refills | Status: AC

## 2024-12-13 MED ORDER — LANTUS SOLOSTAR 100 UNIT/ML ~~LOC~~ SOPN: 22.0000 [IU] | PEN_INJECTOR | Freq: Every day | SUBCUTANEOUS | 3 refills | Status: DC

## 2024-12-13 MED ORDER — INSULIN PEN NEEDLE 32G X 4 MM MISC: 1.0000 | Freq: Four times a day (QID) | 3 refills | Status: AC

## 2024-12-13 NOTE — Patient Instructions (Addendum)
 Obtain Skin - Tac liquid, you may apply this to the skin before Dexcom sensor application to help make it stick to the skin  STOP Glipizide    Start Lantus  22 units once daily Start NovoLog  10 units each time eat a meal       HOW TO TREAT LOW BLOOD SUGARS (Blood sugar LESS THAN 70 MG/DL) Please follow the RULE OF 15 for the treatment of hypoglycemia treatment (when your (blood sugars are less than 70 mg/dL)   STEP 1: Take 15 grams of carbohydrates when your blood sugar is low, which includes:  3-4 GLUCOSE TABS  OR 3-4 OZ OF JUICE OR REGULAR SODA OR ONE TUBE OF GLUCOSE GEL    STEP 2: RECHECK blood sugar in 15 MINUTES STEP 3: If your blood sugar is still low at the 15 minute recheck --> then, go back to STEP 1 and treat AGAIN with another 15 grams of carbohydrates.

## 2024-12-13 NOTE — Progress Notes (Signed)
 "   Name: Lisa [name]  Age/ Sex: 64 y.o., female   MRN/ DOB: [id number], [birth year]     PCP: Katrinka Garnette KIDD, MD   Reason for Endocrinology Evaluation: Type 2 Diabetes Mellitus/ Abnormal FNA  Initial Endocrine Consultative Visit: 05/23/2021    PATIENT IDENTIFIER: Lisa [name] is a 64 y.o. female with a past medical history of HTN, Migraine headaches, T2DM, diastolic dysfunction and PAD. The patient has followed with Endocrinology clinic since 05/23/2021 for consultative assistance with management of her diabetes.      DIABETIC HISTORY:  Lisa [name] was diagnosed with DM 2018. Was on Metformin  but was stopped due to low GFR.Ozempic  caused GI side effects . Her hemoglobin A1c has ranged from 6.5% in 02/2021, peaking at 9.5% in 08/2021.   Farxiga  caused one UTI but this was bad enough to where she stopped it   [place] screen negative 04/2021 with normal 24-hr urine collection    She was switched from Trulicity  to Mounjaro  due to shortage of supply by her PCP into 08/2023 The patient developed small bowel obstruction  in 01/2024 and GLP 1 agonist were discontinued and started on glipizide   The patient was started on insulin  through her PCPs office in December, 2025 due to hyperglycemia while being treated for influenza  THYROID  HISTORY: She was noted to have palpable thyroid  abnormality which prompted a thyroid  ultrasound 03/2021 revealing MNG with a 2.6 cm left inferior nodule meeting criteria for FNA, which was biopsied on 04/22/2021 showing 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    SUBJECTIVE:   During the last visit (6.01/2024): A1c 8.8%    Today (12/13/2024): Lisa [name] is here for a follow up on diabetes management and MNG.  She checks her blood sugars occasionally. No hypoglycemia.    Since her last visit here, the patient presented to the ED for influenza  diagnosis in December, 2025.  BG's were elevated at the time.  BG's were sent while on prednisone  that was subsequently prescribed due to failure to improve her symptoms  She has discontinued prednisone   No nausea except last night but this has resolved  Has occasional  constipation and  diarrhea  No local neck swelling  No palpitations  She is back to regular pepsi     Eats 2 meals a day, snacks   HOME DIABETES REGIMEN:  Glipizide  5 mg, 1 tablet before breakfast and 2 tablets before supper     Statin: no, she is on Praluent   ACE-I/ARB: yes Prior Diabetic Education: No   METER DOWNLOAD SUMMARY: 12/17-1/14/2026 Fingerstick Blood Glucose Tests = 5 Average Number Tests/Day = 0.2 Overall Mean FS Glucose = 339   BG Ranges: Low = 294 High = 387  BG Target % Results: % In target =0  Hypoglycemic Events/30 Days: BG < 50 = 0 Episodes of symptomatic severe hypoglycemia = 0      DIABETIC COMPLICATIONS:  Macrovascular complications:   Denies: CAD, CVA, PVD   HISTORY:  Past Medical History:  Past Medical History:  Diagnosis Date   Allergic rhinitis    Arthritis    Asthma    Breast nodule    right breast, to see dr mat 06-20-2012 for    Chest pain, non-cardiac    Chronic headaches    Cigarette smoker    Complication of anesthesia 05/17/2012   trouble breathing after colonscopy, needed nebulizer  COPD (chronic obstructive pulmonary disease) (HCC)    Diverticulosis    GERD (gastroesophageal reflux disease)    Hepatic hemangioma    Hypertension    IBS (irritable bowel syndrome)    Nuclear stress test    Myoview  05/2019: EF 63, no ischemia. Low Risk   Obesity    Prosthetic eye globe    Pyloric erosion    Sleep apnea    CPAP setting varies from 4-10   Type 2 diabetes mellitus (HCC) 03/11/2017   Past Surgical History:  Past Surgical History:  Procedure Laterality Date   ABDOMINAL AORTOGRAM W/LOWER EXTREMITY N/A 04/30/2017   Procedure: Abdominal  Aortogram w/Lower Extremity;  Surgeon: Harvey Carlin BRAVO, MD;  Location: [facility]ASIVE [facility];  Service: Cardiovascular;  Laterality: N/A;   CARDIAC CATHETERIZATION  12/28/2007   EF 75%. IT REVEALS NORMAL/SUPRANORMAL LEFT VENTRICULAR SYSTOLIC FUNCTION   CARDIOVASCULAR STRESS TEST  03/25/2007   EF 78%   CESAREAN SECTION  2003, 2009   x 2   CHOLECYSTECTOMY  1993   COLONOSCOPY  2014   ENDOMETRIAL ABLATION  09/2010   and D&C   HERNIA REPAIR  2004   umbilical   LAPAROSCOPY     PERIPHERAL VASCULAR BALLOON ANGIOPLASTY Left 04/30/2017   Procedure: Peripheral Vascular Balloon Angioplasty;  Surgeon: Harvey Carlin BRAVO, MD;  Location: [facility] INVASIVE CV [facility];  Service: Cardiovascular;  Laterality: Left;  SFA   RECONSTRUCTION OF EYELID Bilateral    TUBAL LIGATION     UMBILICAL HERNIA REPAIR  2004   US  ECHOCARDIOGRAPHY  05/08/2008   EF 55-60%   VENTRAL HERNIA REPAIR  06/15/2012   Procedure: LAPAROSCOPIC VENTRAL HERNIA;  Surgeon: Morene ONEIDA Olives, MD;  Location: [facility];  Service: General;  Laterality: N/A;  LAPAROSCOPIC REPAIR VENTRAL HERNIA   VIDEO BRONCHOSCOPY Bilateral 11/15/2017   Procedure: VIDEO BRONCHOSCOPY WITH FLUORO;  Surgeon: Shelah Lamar RAMAN, MD;  Location: [facility];  Service: Cardiopulmonary;  Laterality: Bilateral;   Social History:  reports that she has been smoking cigarettes. She has a 57 pack-year smoking history. She has never used smokeless tobacco. She reports that she does not drink alcohol and does not use drugs. Family History:  Family History  Problem Relation Age of Onset   Hypertension Mother    Lung cancer Mother        17   Heart disease Father        pacemaker or defibrillator   Breast cancer Maternal Aunt 78   Heart attack Paternal Uncle    Diabetes Maternal Grandmother    Colon cancer Neg Hx    Esophageal cancer Neg Hx    Pancreatic cancer Neg Hx      HOME MEDICATIONS: Allergies as of 12/13/2024       Reactions   Almond Oil Anaphylaxis, Nausea And Vomiting    Almond-Nuts Only   Apple Juice Anaphylaxis   Ceftin  [cefuroxime ] Other (See Comments)   Severe stomach pain - Diverticulitis flares up   Cherry Extract Anaphylaxis   Cherry fruit only   Other Anaphylaxis, Itching   Most fruits- Are NOT tolerated (PLEASE ASK PATIENT BEFORE GIVING, AS CERTAIN FRUITS ARE TOLERATED IN LIMITED QUANTITIES!!)   Guaifenesin  & Derivatives Other (See Comments)   Restlessness, jittery   Hctz [hydrochlorothiazide] Other (See Comments)   Causes a headache   Naproxen Other (See Comments)   Insomnia   Walnut Itching   Throat itching   Lisinopril  Cough           Medication List  Accurate as of December 13, 2024  9:25 AM. If you have any questions, ask your nurse or doctor.          STOP taking these medications    glipiZIDE  5 MG tablet Commonly known as: GLUCOTROL  Stopped by: Donell Butts, MD       TAKE these medications    albuterol  108 (90 Base) MCG/ACT inhaler Commonly known as: VENTOLIN  HFA Inhale 2 puffs into the lungs every 6 (six) hours as needed for wheezing or shortness of breath (or cough.).   allopurinol  100 MG tablet Commonly known as: ZYLOPRIM  Take 2 tablets (200 mg total) by mouth daily.   ASPIRIN  81 PO Take 81 mg by mouth daily.   benzonatate  200 MG capsule Commonly known as: TESSALON  Take 1 capsule (200 mg total) by mouth 2 (two) times daily as needed for cough.   buPROPion  150 MG 24 hr tablet Commonly known as: WELLBUTRIN  XL TAKE 1 TABLET BY MOUTH EVERY DAY   carvedilol  25 MG tablet Commonly known as: COREG  TAKE 1 TABLET BY MOUTH TWICE A DAY   Dexcom G7 Sensor Misc 1 Device by Does not apply route as directed. Started by: Donell Butts, MD   lidocaine  2 % solution Commonly known as: XYLOCAINE  Use as directed 15 mLs in the mouth or throat every 3 (three) hours as needed for mouth pain (gargle; may spit or swallow).   ONE TOUCH LANCETS Misc Use to check blood sugars twice a day   oseltamivir   75 MG capsule Commonly known as: TAMIFLU  Take 1 capsule (75 mg total) by mouth 2 (two) times daily.   pantoprazole  40 MG tablet Commonly known as: PROTONIX  TAKE 1 TABLET BY MOUTH 30 MINUTES BEFORE BREAKFAST DAILY   predniSONE  20 MG tablet Commonly known as: DELTASONE  Take 1 tablet (20 mg total) by mouth daily with breakfast.   Repatha  SureClick 140 MG/ML Soaj Generic drug: Evolocumab  INJECT 140 MG INTO THE SKIN EVERY 14 (FOURTEEN) DAYS.   telmisartan  40 MG tablet Commonly known as: MICARDIS  TAKE 1 TABLET BY MOUTH EVERY DAY         OBJECTIVE:   Vital Signs: BP (!) 160/78   Pulse 88   Resp 18   Ht 5' 5 (1.651 m)   Wt 248 lb (112.5 kg)   SpO2 98%   BMI 41.27 kg/m   Wt Readings from Last 3 Encounters:  12/13/24 248 lb (112.5 kg)  09/26/24 250 lb (113.4 kg)  08/30/24 255 lb (115.7 kg)     Exam: General: Pt appears well and is in NAD  Neck: General: Supple without adenopathy. Thyroid : Thyroid  size normal.  Bilateral thyroid  nodules appreciated  Lungs: Clear with good BS bilat with no rales, rhonchi, or wheezes  Heart: RRR   Extremities: No pretibial edema.   Neuro: MS is good with appropriate affect, pt is alert and Ox3      DM Foot Exam 05/01/2024 The skin of the feet is intact without sores or ulcerations. The pedal pulses are 2+ on right and 2+ on left. The sensation is intact to a screening 5.07, 10 gram monofilament bilaterally  DATA REVIEWED:  Lab Results  Component Value Date   HGBA1C 12.7 (A) 12/13/2024   HGBA1C 8.8 (A) 05/01/2024   HGBA1C 10.5 (H) 01/06/2024    Latest Reference Range & Units 12/13/24 10:04  TSH 0.40 - 4.50 mIU/L 1.19  T4,Free(Direct) 0.8 - 1.8 ng/dL 1.6       Latest Reference Range & Units 10/30/24 08:56  Sodium 135 - 145 mmol/L 135  Potassium 3.5 - 5.1 mmol/L 3.8  Chloride 98 - 111 mmol/L 98  CO2 22 - 32 mmol/L 24  Glucose 70 - 99 mg/dL 634 (H)  BUN 8 - 23 mg/dL 17  Creatinine 9.55 - 8.99 mg/dL 8.94 (H)  Calcium  8.9 -  10.3 mg/dL 89.7  Anion gap 5 - 15  13  Alkaline Phosphatase 38 - 126 U/L 107  Albumin 3.5 - 5.0 g/dL 3.9  Lipase 11 - 51 U/L 28  AST 15 - 41 U/L 16  ALT 0 - 44 U/L 19  Total Protein 6.5 - 8.1 g/dL 7.6  Total Bilirubin 0.0 - 1.2 mg/dL 0.6  GFR, Estimated >39 mL/min >60   Thyroid  Ultrasound 05/05/2024  FINDINGS: Parenchymal Echotexture: Mildly heterogenous   Isthmus: 1.0 cm   Right lobe: 5.0 x 1.6 x 1.9 cm   Left lobe: 5.8 x 3.6 x 2.7 cm   _________________________________________________________   Estimated total number of nodules >/= 1 cm: 3   Number of spongiform nodules >/=  2 cm not described below (TR1): 0   Number of mixed cystic and solid nodules >/= 1.5 cm not described below (TR2): 0   _________________________________________________________   Nodule # 1: Solid isoechoic nodule in the right lower gland measures no more than 1.1 cm. TI-RADS category 3. Given size (<1.4 cm) and appearance, this nodule does NOT meet TI-RADS criteria for biopsy or dedicated follow-up.   Nodule # 2: Hypoechoic solid nodule in the left upper gland remains stable at 1.2 x 0.7 x 0.7 cm. Findings remain consistent with TI-RADS category 4. *Given size (>/= 1 - 1.4 cm) and appearance, a follow-up ultrasound in 1 year should be considered based on TI-RADS criteria.   Nodule # 3: Continued stability of previously biopsied nodule in the left lower gland at 4.2 x 3.3 x 2.6 cm.   IMPRESSION: 1. Continued stability of previously biopsied nodule in the left lower gland dating back to 2022. 2. Stable small TI-RADS category 4 nodule in the left upper gland dating back to May of 2022. This exam marks 3 years of stability. Recommend continued imaging surveillance until 5 years of stability has been documented.  FNA 04/22/2021   Clinical History: Left; Inferior 2.6cm; Other 2 dimensions: 2.4 x 2.0cm,  Solid /almost completely solid, Hypoechoic, TI-RADS total points 4.  Specimen Submitted:   A. 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 Goal A1c < 7.0 %.    -A1c has trended up from 8.8% to 12.7% -The patient does admit to going back to drinking Pepsi -She has been on metformin  in the past, I suspect this has been discontinued due to low GFR at some point  -She has been on Ozempic  but this caused GI issues  -Intolerant to Farxiga  due to severe yeast infection -She has done well with Trulicity , but due to shortage in supply she was switched to Mounjaro  , which was discontinued after developing small bowel obstruction 01/2024 - We have discussed starting multiple daily injections of insulin  - A prescription of Dexcom was sent to the pharmacy and she was trained by my assistant on Dexcom use - Discussed the importance of avoiding  sugar sweetened beverages and following a low-carb diet - Initially prescribed Lantus  but I received a message from the pharmacy to change, Tresiba  was sent  MEDICATIONS: Stop glipizide  5 mg, 1 tablet before breakfast and 2 tablets before supper Start Tresiba  22 units daily Start NovoLog  10 units with each meal  EDUCATION / INSTRUCTIONS: BG monitoring instructions: Patient is instructed to check her blood sugars 1 times a day, fasting. Call [facility] if: BG persistently < 70 I reviewed the Rule of 15 for the treatment of hypoglycemia in detail with the patient. Literature supplied.     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  - Up-to-date  on thyroid  ultrasound -TFTs today remain within normal range    3) CKD III/microalbuminuria:  - Patient on maximum dose of losartan    F/U in 2 months   Signed electronically by: Stefano Redgie Butts, MD  [facility]  [facility] [address] [place] [place], [place] [address] Phone: [phone] FAX: [phone]   CC: Katrinka Garnette KIDD, MD [address] [place] [address] Phone: [phone]  Fax: [phone]  Return to Endocrinology clinic as below: No future appointments.     "

## 2024-12-14 ENCOUNTER — Ambulatory Visit: Payer: Self-pay | Admitting: Internal Medicine

## 2024-12-14 LAB — T4, FREE: Free T4: 1.6 ng/dL (ref 0.8–1.8)

## 2024-12-14 LAB — TSH: TSH: 1.19 m[IU]/L (ref 0.40–4.50)

## 2024-12-18 ENCOUNTER — Other Ambulatory Visit: Payer: Self-pay | Admitting: Neurosurgery

## 2024-12-18 DIAGNOSIS — Q282 Arteriovenous malformation of cerebral vessels: Secondary | ICD-10-CM

## 2024-12-19 ENCOUNTER — Telehealth: Payer: Self-pay

## 2024-12-19 NOTE — Telephone Encounter (Signed)
 Patient started tresiba  and is experiencing constipation.  She would like to know if this is normal or should she contact her gastroenterologist.

## 2024-12-26 ENCOUNTER — Other Ambulatory Visit: Payer: Self-pay | Admitting: Neurosurgery

## 2024-12-26 ENCOUNTER — Ambulatory Visit (HOSPITAL_COMMUNITY)
Admission: RE | Admit: 2024-12-26 | Discharge: 2024-12-26 | Disposition: A | Source: Ambulatory Visit | Attending: Neurosurgery

## 2024-12-26 ENCOUNTER — Encounter (HOSPITAL_COMMUNITY): Payer: Self-pay

## 2024-12-26 ENCOUNTER — Other Ambulatory Visit: Payer: Self-pay

## 2024-12-26 DIAGNOSIS — E1122 Type 2 diabetes mellitus with diabetic chronic kidney disease: Secondary | ICD-10-CM | POA: Diagnosis not present

## 2024-12-26 DIAGNOSIS — Q282 Arteriovenous malformation of cerebral vessels: Secondary | ICD-10-CM | POA: Diagnosis present

## 2024-12-26 DIAGNOSIS — F17210 Nicotine dependence, cigarettes, uncomplicated: Secondary | ICD-10-CM | POA: Insufficient documentation

## 2024-12-26 DIAGNOSIS — I129 Hypertensive chronic kidney disease with stage 1 through stage 4 chronic kidney disease, or unspecified chronic kidney disease: Secondary | ICD-10-CM | POA: Diagnosis not present

## 2024-12-26 DIAGNOSIS — N1831 Chronic kidney disease, stage 3a: Secondary | ICD-10-CM | POA: Insufficient documentation

## 2024-12-26 LAB — BASIC METABOLIC PANEL WITH GFR
Anion gap: 11 (ref 5–15)
BUN: 18 mg/dL (ref 8–23)
CO2: 24 mmol/L (ref 22–32)
Calcium: 9.5 mg/dL (ref 8.9–10.3)
Chloride: 101 mmol/L (ref 98–111)
Creatinine, Ser: 0.96 mg/dL (ref 0.44–1.00)
GFR, Estimated: >60 mL/min
Glucose, Bld: 354 mg/dL — ABNORMAL HIGH (ref 70–99)
Potassium: 3.9 mmol/L (ref 3.5–5.1)
Sodium: 135 mmol/L (ref 135–145)

## 2024-12-26 LAB — APTT: aPTT: 29 s (ref 24–36)

## 2024-12-26 LAB — CBC WITH DIFFERENTIAL/PLATELET
Abs Immature Granulocytes: 0.04 10*3/uL (ref 0.00–0.07)
Basophils Absolute: 0 10*3/uL (ref 0.0–0.1)
Basophils Relative: 0 %
Eosinophils Absolute: 0.1 10*3/uL (ref 0.0–0.5)
Eosinophils Relative: 1 %
HCT: 47.1 % — ABNORMAL HIGH (ref 36.0–46.0)
Hemoglobin: 15.8 g/dL — ABNORMAL HIGH (ref 12.0–15.0)
Immature Granulocytes: 0 %
Lymphocytes Relative: 20 %
Lymphs Abs: 1.9 10*3/uL (ref 0.7–4.0)
MCH: 30 pg (ref 26.0–34.0)
MCHC: 33.5 g/dL (ref 30.0–36.0)
MCV: 89.5 fL (ref 80.0–100.0)
Monocytes Absolute: 0.5 10*3/uL (ref 0.1–1.0)
Monocytes Relative: 5 %
Neutro Abs: 7.1 10*3/uL (ref 1.7–7.7)
Neutrophils Relative %: 74 %
Platelets: 259 10*3/uL (ref 150–400)
RBC: 5.26 MIL/uL — ABNORMAL HIGH (ref 3.87–5.11)
RDW: 13.4 % (ref 11.5–15.5)
WBC: 9.8 10*3/uL (ref 4.0–10.5)
nRBC: 0 % (ref 0.0–0.2)

## 2024-12-26 LAB — PROTIME-INR
INR: 0.9 (ref 0.8–1.2)
Prothrombin Time: 13 s (ref 11.4–15.2)

## 2024-12-26 MED ORDER — MIDAZOLAM HCL 2 MG/2ML IJ SOLN
INTRAMUSCULAR | Status: AC
  Filled 2024-12-26: qty 2

## 2024-12-26 MED ORDER — FENTANYL CITRATE (PF) 100 MCG/2ML IJ SOLN
INTRAMUSCULAR | Status: AC
  Filled 2024-12-26: qty 2

## 2024-12-26 MED ORDER — HYDROCODONE-ACETAMINOPHEN 5-325 MG PO TABS: 1.0000 | ORAL_TABLET | ORAL | Status: DC | PRN

## 2024-12-26 MED ORDER — LIDOCAINE HCL (PF) 1 % IJ SOLN
10.0000 mL | Freq: Once | INTRAMUSCULAR | Status: AC
  Administered 2024-12-26: 10 mL

## 2024-12-26 MED ORDER — LIDOCAINE HCL 1 % IJ SOLN
INTRAMUSCULAR | Status: AC
  Filled 2024-12-26: qty 20

## 2024-12-26 MED ORDER — CHLORHEXIDINE GLUCONATE CLOTH 2 % EX PADS: 6.0000 | MEDICATED_PAD | Freq: Once | CUTANEOUS | Status: DC

## 2024-12-26 MED ORDER — MIDAZOLAM HCL (PF) 2 MG/2ML IJ SOLN
INTRAMUSCULAR | Status: AC | PRN
  Administered 2024-12-26: 1 mg via INTRAVENOUS

## 2024-12-26 MED ORDER — FENTANYL CITRATE (PF) 100 MCG/2ML IJ SOLN
INTRAMUSCULAR | Status: AC | PRN
  Administered 2024-12-26: 25 ug via INTRAVENOUS

## 2024-12-26 MED ORDER — HEPARIN SODIUM (PORCINE) 1000 UNIT/ML IJ SOLN
INTRAMUSCULAR | Status: AC | PRN
  Administered 2024-12-26: 2000 [IU] via INTRAVENOUS

## 2024-12-26 MED ORDER — IOHEXOL 300 MG/ML  SOLN
100.0000 mL | Freq: Once | INTRAMUSCULAR | Status: AC | PRN
  Administered 2024-12-26: 75 mL via INTRA_ARTERIAL

## 2024-12-26 MED ORDER — HEPARIN SODIUM (PORCINE) 1000 UNIT/ML IJ SOLN
INTRAMUSCULAR | Status: AC
  Filled 2024-12-26: qty 10

## 2024-12-26 MED ORDER — SODIUM CHLORIDE 0.9 % IV SOLN: INTRAVENOUS | Status: DC

## 2024-12-26 NOTE — Op Note (Signed)
 " ENDOVASCULAR NEUROSURGERY OPERATIVE NOTE   PROCEDURE: Diagnostic Cerebral Angiogram   SURGEON:   Dr. Gerldine Maizes, MD  HISTORY:   The patient is a 64 y.o. yo female initially presented to the emergency department several weeks ago with flulike symptoms.  She was apparently also experiencing some headache and underwent CT and subsequent CT angiogram revealing the possibility of a right frontal arteriovenous malformation.  She was seen in the outpatient neurosurgery clinic where further workup with diagnostic cerebral angiogram was recommended.  APPROACH:   The technical aspects of the procedure as well as its potential risks and benefits were reviewed with the patient. These risks included but were not limited bleeding, infection, allergic reaction, damage to organs/vital structures, stroke, non-diagnostic procedure, and the catastrophic outcomes of heart attack, coma, and death. With an understanding of these risks, informed consent was obtained and witnessed.    The patient was placed in the supine position on the angiography table and the skin of right wrist and groin prepped in the usual sterile fashion. The procedure was performed under local anesthesia (1%-solution of bicarbonate-bufferred Lidoacaine) and conscious sedation with Versed  and fentanyl  monitored by the in-suite nurse and myself, including non-invasive blood pressure and continuous pulse oxymetry.    Access to the right common femoral artery was obtained under ultrasound guidance in order to directly visualize the micropuncture needle within the lumen of the right common femoral artery.  A short 5 French sheath was then placed using standard Seldinger technique.  HEPARIN :  2000 Units total.    CONTRAST AGENT:  See IR records   FLUOROSCOPY TIME:  See IR records    CATHETER(S) AND WIRE(S):    5-French JB-1 glidecatheter   0.035 glidewire    VESSELS CATHETERIZED:   Right internal carotid   Left internal carotid    Right vertebral   Left vertebral   Right common femoral  VESSELS STUDIED:   Right internal carotid, head Right internal carotid, 3D rotational angiogram Left internal carotid, head Left vertebral Right vertebral Right femoral  PROCEDURAL NARRATIVE:   A 5-Fr JB-1 terumo glide catheter was advanced over a 0.035 glidewire into the aortic arch. The above vessels were then sequentially catheterized and cervical/cerebral angiograms taken. After review of images, the catheter was removed without incident.    INTERPRETATION:   Right internal carotid, head:   Injection reveals the presence of a widely patent ICA, M1, and A1 segments and their branches.  There is a relatively compact arteriovenous malformation nidus within the inferior portion of the right frontal lobe, within the substantia innominata/anterior perforated substance and inferior basal ganglia.  Overall size is approximately 2.2cm (AP) x 2.3cm (ML) x 2.1cm (CC). Supply to the arteriovenous malformation is primarily through medial and lateral lenticulostriate vessels.  Venous outflow is directed primarily through a single large draining vein which bifurcates, with primary drainage into the colonic system through the right basal vein.  There is also a smaller draining vein which empties more inferior laterally into the transverse sigmoid junction.  No arterial inflow aneurysms are identified.  There are no venous varices identified.  The parenchymal and venous phases are unremarkable. The venous sinuses are widely patent.    Right internal carotid, 3D rotation: Three-dimensional rotational angiographic images were reconstructed on an independent workstation for review including axial coronal, and sagittal multiplanar reconstructions.  These further delineate the above-described arteriovenous malformation.  Specifically, I do not identify any arterial inflow aneurysms.  The venous outflow is again well-delineated, with a large  anteroinferiorly directed draining vein which bifurcates behind the AVM nidus.  Again, I do not identify any venous varices.  Left internal carotid, head:   Injection reveals the presence of a widely patent ICA, A1, and M1 segments and their branches.  There is no supply to the above-described right sided ganglionic arteriovenous malformation.  The parenchymal and venous phases are unremarkable. The venous sinuses are widely patent.    Right vertebral:   Injection reveals the presence of a widely patent vertebral artery. This leads to a widely patent basilar artery that terminates in bilateral P1. The basilar apex is normal.  There is no supply to the ganglionic arteriovenous malformation from the posterior circulation through this right sided injection.  The parenchymal and venous phases are normal. The venous sinuses are patent.    Left vertebral:    Normal vessel. No PICA aneurysm.  Again, there is no supplied of the above-described ganglionic arteriovenous malformation from this right sided vertebral artery injection.  Right femoral:    Normal vessel. No significant atherosclerotic disease. Arterial sheath in adequate position.   DISPOSITION:  Upon completion of the study, the sheath was removed and hemostasis obtained using a 5-Fr Exoseal closure device. Good proximal and distal extremity pulses were documented upon achievement of hemostasis. The procedure was well tolerated and no early complications were observed.  The patient was transferred to the recovery area to be positioned flat in bed for 3 hours.    IMPRESSION:  1. 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    Gerldine Maizes, MD [facility]   "

## 2024-12-26 NOTE — H&P (Addendum)
 "  HPI:     Lisa [name] is a 64 year old woman seen for initial consultation after recent visit to the emergency department which she presented for flu like symptoms.  She was apparently complaining of some headache as well as abdominal pain.  Workup included a CT scan which ultimately led to both CT angiogram and MRI which revealed the possibility of a right ganglionic arteriovenous malformation.  She was therefore referred for outpatient Neurosurgical evaluation.  Upon questioning, the patient does report a chronic history of headaches for which she has previously been seen by Neurology.  She does not report any changes in her vision.  No new numbness tingling or weakness of the extremities.  Of note, the patient reports a history of hypertension, apparently poorly controlled diabetes with last A1c greater than 12.0.  Patient also reports a history of COPD.  She does not report a previous heart attack or stroke.  No known liver disease.  She has stage 3 chronic kidney disease.  She does not report any cancer history.  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   CKD stage 3a, GFR 45-59 ml/min (HCC) 09/18/2020   Fatty liver 11/06/2019   Chronic rhinitis 06/26/2019   Trigger finger, right middle finger 05/04/2019   Coronary artery calcification 04/20/2019   Bilateral leg pain 04/14/2018   Hyperlipidemia 04/12/2018   S/P bronchoscopy with biopsy    Hyperlipidemia associated with type 2 diabetes mellitus (HCC) 09/06/2017   Breast mass 08/03/2017   Chronic  hip pain 07/29/2017   PAD (peripheral artery disease) 01/19/2017   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   Left knee pain 05/15/2016   Flank pain 04/21/2016   Abdominal pain 02/28/2016   GERD (gastroesophageal reflux disease) 02/14/2016   Morbid obesity (HCC) 02/14/2016   Tobacco use disorder 08/12/2015   Pulmonary nodule, right 04/11/2015   COPD (chronic obstructive pulmonary disease) (HCC) 01/18/2015   Mediastinal lymphadenopathy 01/18/2015   DOE (dyspnea on exertion) 03/08/2014   Migraine without aura 07/26/2013   Sleep apnea 02/11/2012   Chest discomfort 10/12/2011   Essential hypertension 10/12/2011   Past Medical History:  Diagnosis Date   Allergic rhinitis    Arthritis    Asthma    Breast nodule    right breast, to see dr mat 06-20-2012 for    Chest pain, non-cardiac    Chronic headaches    Cigarette smoker    Complication of anesthesia 05/17/2012   trouble breathing after colonscopy, needed nebulizer   COPD (chronic obstructive pulmonary disease) (HCC)    Diverticulosis    GERD (gastroesophageal reflux disease)    Hepatic hemangioma    Hypertension    IBS (irritable bowel syndrome)    Nuclear stress test    Myoview  05/2019: EF 63, no ischemia. Low Risk   Obesity    Prosthetic eye globe    Pyloric erosion    Sleep apnea    CPAP setting varies from 4-10   Type 2 diabetes mellitus (HCC) 03/11/2017    Past Surgical History:  Procedure Laterality Date  ABDOMINAL AORTOGRAM W/LOWER EXTREMITY N/A 04/30/2017   Procedure: Abdominal Aortogram w/Lower Extremity;  Surgeon: Harvey Carlin BRAVO, MD;  [facility]: [facility] INVASIVE CV LAB;  Service: Cardiovascular;  Laterality: N/A;   CARDIAC CATHETERIZATION  12/28/2007   EF 75%. IT REVEALS NORMAL/SUPRANORMAL LEFT VENTRICULAR SYSTOLIC FUNCTION   CARDIOVASCULAR STRESS TEST  03/25/2007   EF 78%   CESAREAN SECTION  2003, 2009   x 2   CHOLECYSTECTOMY  1993   COLONOSCOPY  2014   ENDOMETRIAL  ABLATION  09/2010   and D&C   HERNIA REPAIR  2004   umbilical   LAPAROSCOPY     PERIPHERAL VASCULAR BALLOON ANGIOPLASTY Left 04/30/2017   Procedure: Peripheral Vascular Balloon Angioplasty;  Surgeon: Harvey Carlin BRAVO, MD;  Location: [facility] INVASIVE CV LAB;  Service: Cardiovascular;  Laterality: Left;  SFA   RECONSTRUCTION OF EYELID Bilateral    TUBAL LIGATION     UMBILICAL HERNIA REPAIR  2004   US  ECHOCARDIOGRAPHY  05/08/2008   EF 55-60%   VENTRAL HERNIA REPAIR  06/15/2012   Procedure: LAPAROSCOPIC VENTRAL HERNIA;  Surgeon: Morene ONEIDA Olives, MD;  Location: [facility] ORS;  Service: General;  Laterality: N/A;  LAPAROSCOPIC REPAIR VENTRAL HERNIA   VIDEO BRONCHOSCOPY Bilateral 11/15/2017   Procedure: VIDEO BRONCHOSCOPY WITH FLUORO;  Surgeon: Shelah Lamar RAMAN, MD;  Location: [facility] ENDOSCOPY;  Service: Cardiopulmonary;  Laterality: Bilateral;    (Not in a hospital admission)  Allergies[1]  Social History   Tobacco Use   Smoking status: Every Day    Current packs/day: 1.50    Average packs/day: 1.5 packs/day for 38.0 years (57.0 ttl pk-yrs)    Types: Cigarettes   Smokeless tobacco: Never   Tobacco comments:    10 cigarettes daily [facility] 09/10/2020  Substance Use Topics   Alcohol use: No    Alcohol/week: 0.0 standard drinks of alcohol    Family History  Problem Relation Age of Onset   Hypertension Mother    Lung cancer Mother        29   Heart disease Father        pacemaker or defibrillator   Breast cancer Maternal Aunt 78   Heart attack Paternal Uncle    Diabetes Maternal Grandmother    Colon cancer Neg Hx    Esophageal cancer Neg Hx    Pancreatic cancer Neg Hx       Objective:   CT angiogram as well as MRI of the brain appeared to demonstrate a right caudate arteriovenous malformation, with AV contrast enhancement.  There does appear to be a large vascular structure arising from the carotid terminus on the right side leading into this area.  There also appears to be dilated right basal  vein and opacification of the thalamic venous system suggesting the presence of a arteriovenous shunt.  Interestingly, similar findings were not seen on prior MRI of 2018.   Awake, alert, oriented Speech fluent, appropriate CN grossly intact 5/5 BUE/BLE    Assessment:   64 year old woman with essentially incidental discovery of possible arteriovenous malformation in the right caudate.   Plan:   - we will plan on proceeding with diagnostic cerebral angiogram for further workup of this vascular lesion  I have reviewed the indications for the procedure as well as the details of the procedure and the expected postoperative course and recovery at length with the patient in the office. We have also reviewed in detail the risks, benefits, and alternatives to the procedure. All questions were answered and Lisa [name] provided informed  consent to proceed.  Gerldine Maizes, MD  [facility]      [1]  Allergies Allergen Reactions   Almond Oil Anaphylaxis and Nausea And Vomiting    Almond-Nuts Only   Apple Juice Anaphylaxis   Ceftin  [Cefuroxime ] Other (See Comments)    Severe stomach pain - Diverticulitis flares up   Cherry Extract Anaphylaxis    Cherry fruit only    Other Anaphylaxis and Itching    Most fruits- Are NOT tolerated (PLEASE ASK PATIENT BEFORE GIVING, AS CERTAIN FRUITS ARE TOLERATED IN LIMITED QUANTITIES!!)   Guaifenesin  & Derivatives Other (See Comments)    Restlessness, jittery   Hctz [Hydrochlorothiazide] Other (See Comments)    Causes a headache   Naproxen Other (See Comments)    Insomnia   Walnut Itching    Throat itching   Lisinopril  Cough        "

## 2024-12-26 NOTE — Progress Notes (Signed)
 Pt and husband received discharge instructions, teach back performed. Iv removed, no complications. Rt fem site is clean dry intact, site is soft, no signs of bleeding. Pt escorted out via wheelchair to husbands vehicle.

## 2024-12-26 NOTE — Discharge Instructions (Signed)
 Femoral Site Care This sheet gives you information about how to care for yourself after your procedure. Your health care provider may also give you more specific instructions. If you have problems or questions, contact your health care provider. What can I expect after the procedure?  After the procedure, it is common to have: Bruising that usually fades within 1-2 weeks. Tenderness at the site. Follow these instructions at home: Wound care Follow instructions from your health care provider about how to take care of your insertion site. Make sure you: Wash your hands with soap and water  before you change your bandage (dressing). If soap and water  are not available, use hand sanitizer. Remove your dressing as told by your health care provider. 24 hours Do not take baths, swim, or use a hot tub until your health care provider approves. You may shower 24-48 hours after the procedure or as told by your health care provider. Gently wash the site with plain soap and water . Pat the area dry with a clean towel. Do not rub the site. This may cause bleeding. Do not apply powder or lotion to the site. Keep the site clean and dry. Check your femoral site every day for signs of infection. Check for: Redness, swelling, or pain. Fluid or blood. Warmth. Pus or a bad smell. Activity For the first 2-3 days after your procedure, or as long as directed: Avoid climbing stairs as much as possible. Do not squat. Do not lift anything that is heavier than 10 lb (4.5 kg), or the limit that you are told, until your health care provider says that it is safe. For 5 days Rest as directed. Avoid sitting for a long time without moving. Get up to take short walks every 1-2 hours. Do not drive for 24 hours if you were given a medicine to help you relax (sedative). General instructions Take over-the-counter and prescription medicines only as told by your health care provider. Keep all follow-up visits as told by your  health care provider. This is important. Contact a health care provider if you have: A fever or chills. You have redness, swelling, or pain around your insertion site. Get help right away if: The catheter insertion area swells very fast. You pass out. You suddenly start to sweat or your skin gets clammy. The catheter insertion area is bleeding, and the bleeding does not stop when you hold steady pressure on the area. The area near or just beyond the catheter insertion site becomes pale, cool, tingly, or numb. These symptoms may represent a serious problem that is an emergency. Do not wait to see if the symptoms will go away. Get medical help right away. Call your local emergency services (911 in the [place] Do not drive yourself to the hospital. Summary After the procedure, it is common to have bruising that usually fades within 1-2 weeks. Check your femoral site every day for signs of infection. Do not lift anything that is heavier than 10 lb (4.5 kg), or the limit that you are told, until your health care provider says that it is safe. This information is not intended to replace advice given to you by your health care provider. Make sure you discuss any questions you have with your health care provider. Document Revised: 11/29/2017 Document Reviewed: 11/29/2017 Elsevier Patient Education  2020 [employer].

## 2025-02-21 ENCOUNTER — Ambulatory Visit: Admitting: Internal Medicine
